# Patient Record
Sex: Female | Born: 1940
Health system: Southern US, Community
[De-identification: ages and names within clinical notes are randomized; demographics above are authoritative.]

## PROBLEM LIST (undated history)

## (undated) DIAGNOSIS — G20 Parkinson's disease: Secondary | ICD-10-CM

## (undated) DIAGNOSIS — I739 Peripheral vascular disease, unspecified: Secondary | ICD-10-CM

## (undated) DIAGNOSIS — G20A1 Parkinson's disease without dyskinesia, without mention of fluctuations: Secondary | ICD-10-CM

## (undated) DIAGNOSIS — I714 Abdominal aortic aneurysm, without rupture, unspecified: Secondary | ICD-10-CM

## (undated) DIAGNOSIS — E785 Hyperlipidemia, unspecified: Secondary | ICD-10-CM

## (undated) DIAGNOSIS — I5042 Chronic combined systolic (congestive) and diastolic (congestive) heart failure: Secondary | ICD-10-CM

## (undated) HISTORY — DX: Hyperlipidemia, unspecified: E78.5

## (undated) HISTORY — DX: Chronic combined systolic (congestive) and diastolic (congestive) heart failure: I50.42

## (undated) HISTORY — DX: Peripheral vascular disease, unspecified: I73.9

---

## 1963-07-20 HISTORY — PX: BREAST CYST ASPIRATION: SHX578

## 1978-07-19 HISTORY — PX: NASAL SEPTUM SURGERY: SHX37

## 2000-06-28 ENCOUNTER — Other Ambulatory Visit: Admission: RE | Admit: 2000-06-28 | Discharge: 2000-06-28 | Payer: Self-pay | Admitting: Obstetrics and Gynecology

## 2002-03-02 ENCOUNTER — Ambulatory Visit: Admission: RE | Admit: 2002-03-02 | Discharge: 2002-03-02 | Payer: Self-pay | Admitting: Oncology

## 2002-04-10 HISTORY — PX: HAMMER TOE SURGERY: SHX385

## 2003-05-23 ENCOUNTER — Other Ambulatory Visit: Admission: RE | Admit: 2003-05-23 | Discharge: 2003-05-23 | Payer: Self-pay | Admitting: Family Medicine

## 2004-06-03 ENCOUNTER — Other Ambulatory Visit: Admission: RE | Admit: 2004-06-03 | Discharge: 2004-06-03 | Payer: Self-pay | Admitting: Family Medicine

## 2004-06-03 ENCOUNTER — Ambulatory Visit: Payer: Self-pay | Admitting: Family Medicine

## 2004-06-19 ENCOUNTER — Ambulatory Visit: Payer: Self-pay | Admitting: Family Medicine

## 2004-09-19 ENCOUNTER — Ambulatory Visit: Payer: Self-pay | Admitting: Internal Medicine

## 2005-04-22 ENCOUNTER — Encounter: Admission: RE | Admit: 2005-04-22 | Discharge: 2005-04-22 | Payer: Self-pay | Admitting: Family Medicine

## 2005-04-22 ENCOUNTER — Ambulatory Visit: Payer: Self-pay | Admitting: Family Medicine

## 2005-06-15 ENCOUNTER — Other Ambulatory Visit: Admission: RE | Admit: 2005-06-15 | Discharge: 2005-06-15 | Payer: Self-pay | Admitting: Obstetrics and Gynecology

## 2005-09-07 ENCOUNTER — Encounter: Admission: RE | Admit: 2005-09-07 | Discharge: 2005-10-13 | Payer: Self-pay | Admitting: Orthopedic Surgery

## 2005-10-14 ENCOUNTER — Encounter: Admission: RE | Admit: 2005-10-14 | Discharge: 2006-01-12 | Payer: Self-pay | Admitting: Orthopedic Surgery

## 2006-04-12 ENCOUNTER — Ambulatory Visit: Payer: Self-pay | Admitting: Family Medicine

## 2006-06-01 ENCOUNTER — Ambulatory Visit: Payer: Self-pay | Admitting: Family Medicine

## 2006-06-01 ENCOUNTER — Ambulatory Visit: Payer: Self-pay | Admitting: Internal Medicine

## 2006-07-04 ENCOUNTER — Ambulatory Visit: Payer: Self-pay | Admitting: Family Medicine

## 2006-07-04 ENCOUNTER — Encounter: Payer: Self-pay | Admitting: Family Medicine

## 2006-07-04 ENCOUNTER — Other Ambulatory Visit: Admission: RE | Admit: 2006-07-04 | Discharge: 2006-07-04 | Payer: Self-pay | Admitting: Family Medicine

## 2006-07-04 LAB — CONVERTED CEMR LAB
ALT: 35 units/L (ref 0–40)
AST: 30 units/L (ref 0–37)
Albumin: 4.1 g/dL (ref 3.5–5.2)
Alkaline Phosphatase: 64 units/L (ref 39–117)
Basophils Absolute: 0 10*3/uL (ref 0.0–0.1)
Basophils Relative: 0.3 % (ref 0.0–1.0)
Bilirubin, Direct: 0.1 mg/dL (ref 0.0–0.3)
Chol/HDL Ratio, serum: 3.1
Cholesterol: 142 mg/dL (ref 0–200)
Eosinophil percent: 1.5 % (ref 0.0–5.0)
HCT: 42.2 % (ref 36.0–46.0)
HDL: 45.5 mg/dL (ref >39.0)
Hemoglobin: 13.6 g/dL (ref 12.0–15.0)
LDL Cholesterol: 81 mg/dL (ref 0–99)
Lymphocytes Relative: 23.1 % (ref 12.0–46.0)
MCHC: 32.2 g/dL (ref 30.0–36.0)
MCV: 96.1 fL (ref 78.0–100.0)
Monocytes Absolute: 0.4 10*3/uL (ref 0.2–0.7)
Monocytes Relative: 6.9 % (ref 3.0–11.0)
Neutro Abs: 4.4 10*3/uL (ref 1.4–7.7)
Neutrophils Relative %: 68.2 % (ref 43.0–77.0)
Platelets: 311 10*3/uL (ref 150–400)
RBC: 4.4 M/uL (ref 3.87–5.11)
RDW: 12.2 % (ref 11.5–14.6)
TSH: 1.81 microintl units/mL (ref 0.35–5.50)
Total Bilirubin: 0.9 mg/dL (ref 0.3–1.2)
Total Protein: 7.3 g/dL (ref 6.0–8.3)
Triglyceride fasting, serum: 78 mg/dL (ref 0–149)
VLDL: 16 mg/dL (ref 0–40)
WBC: 6.4 10*3/uL (ref 4.5–10.5)

## 2006-07-05 ENCOUNTER — Ambulatory Visit: Payer: Self-pay | Admitting: Family Medicine

## 2006-07-07 ENCOUNTER — Encounter: Payer: Self-pay | Admitting: Internal Medicine

## 2006-07-07 ENCOUNTER — Ambulatory Visit: Payer: Self-pay | Admitting: Internal Medicine

## 2006-07-13 ENCOUNTER — Ambulatory Visit: Payer: Self-pay | Admitting: Cardiology

## 2006-08-04 ENCOUNTER — Ambulatory Visit: Payer: Self-pay | Admitting: Family Medicine

## 2007-05-16 ENCOUNTER — Ambulatory Visit: Payer: Self-pay | Admitting: Family Medicine

## 2007-08-18 ENCOUNTER — Encounter: Payer: Self-pay | Admitting: Family Medicine

## 2007-08-18 ENCOUNTER — Ambulatory Visit: Payer: Self-pay | Admitting: Family Medicine

## 2007-08-18 ENCOUNTER — Other Ambulatory Visit: Admission: RE | Admit: 2007-08-18 | Discharge: 2007-08-18 | Payer: Self-pay | Admitting: Family Medicine

## 2007-08-18 DIAGNOSIS — N951 Menopausal and female climacteric states: Secondary | ICD-10-CM | POA: Insufficient documentation

## 2007-08-18 DIAGNOSIS — N644 Mastodynia: Secondary | ICD-10-CM | POA: Insufficient documentation

## 2007-08-18 DIAGNOSIS — K219 Gastro-esophageal reflux disease without esophagitis: Secondary | ICD-10-CM | POA: Insufficient documentation

## 2007-08-18 DIAGNOSIS — E785 Hyperlipidemia, unspecified: Secondary | ICD-10-CM | POA: Insufficient documentation

## 2007-08-18 DIAGNOSIS — Z9189 Other specified personal risk factors, not elsewhere classified: Secondary | ICD-10-CM | POA: Insufficient documentation

## 2007-08-18 DIAGNOSIS — N6009 Solitary cyst of unspecified breast: Secondary | ICD-10-CM | POA: Insufficient documentation

## 2007-08-18 DIAGNOSIS — Z9889 Other specified postprocedural states: Secondary | ICD-10-CM | POA: Insufficient documentation

## 2007-08-22 ENCOUNTER — Encounter (INDEPENDENT_AMBULATORY_CARE_PROVIDER_SITE_OTHER): Payer: Self-pay | Admitting: *Deleted

## 2007-10-05 ENCOUNTER — Encounter: Payer: Self-pay | Admitting: Family Medicine

## 2007-10-16 ENCOUNTER — Telehealth (INDEPENDENT_AMBULATORY_CARE_PROVIDER_SITE_OTHER): Payer: Self-pay | Admitting: *Deleted

## 2007-11-13 ENCOUNTER — Encounter (INDEPENDENT_AMBULATORY_CARE_PROVIDER_SITE_OTHER): Payer: Self-pay | Admitting: *Deleted

## 2007-11-13 ENCOUNTER — Ambulatory Visit: Payer: Self-pay | Admitting: Family Medicine

## 2007-11-13 LAB — CONVERTED CEMR LAB
OCCULT 1: NEGATIVE
OCCULT 2: NEGATIVE
OCCULT 3: NEGATIVE

## 2008-02-23 ENCOUNTER — Ambulatory Visit: Payer: Self-pay | Admitting: Family Medicine

## 2008-02-27 ENCOUNTER — Encounter: Payer: Self-pay | Admitting: Family Medicine

## 2008-03-04 ENCOUNTER — Encounter (INDEPENDENT_AMBULATORY_CARE_PROVIDER_SITE_OTHER): Payer: Self-pay | Admitting: *Deleted

## 2008-04-16 ENCOUNTER — Ambulatory Visit: Payer: Self-pay | Admitting: Family Medicine

## 2008-06-04 ENCOUNTER — Telehealth: Payer: Self-pay | Admitting: Family Medicine

## 2008-10-17 ENCOUNTER — Telehealth (INDEPENDENT_AMBULATORY_CARE_PROVIDER_SITE_OTHER): Payer: Self-pay | Admitting: *Deleted

## 2008-11-13 ENCOUNTER — Encounter: Payer: Self-pay | Admitting: Family Medicine

## 2008-11-15 ENCOUNTER — Other Ambulatory Visit: Admission: RE | Admit: 2008-11-15 | Discharge: 2008-11-15 | Payer: Self-pay | Admitting: Family Medicine

## 2008-11-15 ENCOUNTER — Encounter: Payer: Self-pay | Admitting: Family Medicine

## 2008-11-15 ENCOUNTER — Ambulatory Visit: Payer: Self-pay | Admitting: Family Medicine

## 2008-11-19 ENCOUNTER — Encounter (INDEPENDENT_AMBULATORY_CARE_PROVIDER_SITE_OTHER): Payer: Self-pay | Admitting: *Deleted

## 2008-12-20 ENCOUNTER — Encounter: Payer: Self-pay | Admitting: Family Medicine

## 2008-12-20 ENCOUNTER — Ambulatory Visit: Payer: Self-pay | Admitting: Family Medicine

## 2008-12-20 DIAGNOSIS — D239 Other benign neoplasm of skin, unspecified: Secondary | ICD-10-CM | POA: Insufficient documentation

## 2008-12-24 ENCOUNTER — Ambulatory Visit: Payer: Self-pay | Admitting: Family Medicine

## 2009-02-12 ENCOUNTER — Ambulatory Visit: Payer: Self-pay | Admitting: Family Medicine

## 2009-02-16 LAB — CONVERTED CEMR LAB
ALT: 22 units/L (ref 0–35)
AST: 26 units/L (ref 0–37)
Albumin: 4.3 g/dL (ref 3.5–5.2)
Alkaline Phosphatase: 55 units/L (ref 39–117)
Bilirubin, Direct: 0 mg/dL (ref 0.0–0.3)
Cholesterol: 166 mg/dL (ref 0–200)
HDL: 51.7 mg/dL (ref >39.00)
LDL Cholesterol: 100 mg/dL — ABNORMAL HIGH (ref 0–99)
Total Bilirubin: 0.9 mg/dL (ref 0.3–1.2)
Total CHOL/HDL Ratio: 3
Total Protein: 7.4 g/dL (ref 6.0–8.3)
Triglycerides: 71 mg/dL (ref 0.0–149.0)
VLDL: 14.2 mg/dL (ref 0.0–40.0)
Vit D, 25-Hydroxy: 51 ng/mL (ref 30–89)

## 2009-02-17 ENCOUNTER — Encounter (INDEPENDENT_AMBULATORY_CARE_PROVIDER_SITE_OTHER): Payer: Self-pay | Admitting: *Deleted

## 2009-05-19 ENCOUNTER — Ambulatory Visit: Payer: Self-pay | Admitting: Family Medicine

## 2009-11-14 ENCOUNTER — Encounter: Payer: Self-pay | Admitting: Family Medicine

## 2009-12-05 ENCOUNTER — Ambulatory Visit: Payer: Self-pay | Admitting: Family Medicine

## 2009-12-08 ENCOUNTER — Encounter: Payer: Self-pay | Admitting: Family Medicine

## 2009-12-12 ENCOUNTER — Ambulatory Visit: Payer: Self-pay | Admitting: Family Medicine

## 2009-12-12 LAB — CONVERTED CEMR LAB: Fecal Occult Bld: NEGATIVE

## 2009-12-24 ENCOUNTER — Encounter: Payer: Self-pay | Admitting: Family Medicine

## 2010-01-06 DIAGNOSIS — M810 Age-related osteoporosis without current pathological fracture: Secondary | ICD-10-CM

## 2010-01-06 DIAGNOSIS — M8580 Other specified disorders of bone density and structure, unspecified site: Secondary | ICD-10-CM | POA: Insufficient documentation

## 2010-03-05 ENCOUNTER — Telehealth (INDEPENDENT_AMBULATORY_CARE_PROVIDER_SITE_OTHER): Payer: Self-pay | Admitting: *Deleted

## 2010-04-29 ENCOUNTER — Ambulatory Visit: Payer: Self-pay | Admitting: Family Medicine

## 2010-06-10 ENCOUNTER — Telehealth (INDEPENDENT_AMBULATORY_CARE_PROVIDER_SITE_OTHER): Payer: Self-pay | Admitting: *Deleted

## 2010-08-16 LAB — CONVERTED CEMR LAB
ALT: 20 units/L (ref 0–35)
ALT: 21 units/L (ref 0–35)
ALT: 28 units/L (ref 0–35)
AST: 22 units/L (ref 0–37)
AST: 26 units/L (ref 0–37)
AST: 26 units/L (ref 0–37)
Albumin: 4.2 g/dL (ref 3.5–5.2)
Albumin: 4.3 g/dL (ref 3.5–5.2)
Albumin: 4.5 g/dL (ref 3.5–5.2)
Alkaline Phosphatase: 55 units/L (ref 39–117)
Alkaline Phosphatase: 62 units/L (ref 39–117)
Alkaline Phosphatase: 63 units/L (ref 39–117)
BUN: 15 mg/dL (ref 6–23)
BUN: 15 mg/dL (ref 6–23)
BUN: 18 mg/dL (ref 6–23)
Basophils Absolute: 0 10*3/uL (ref 0.0–0.1)
Basophils Absolute: 0 10*3/uL (ref 0.0–0.1)
Basophils Absolute: 0 10*3/uL (ref 0.0–0.1)
Basophils Relative: 0 % (ref 0–1)
Basophils Relative: 0.3 % (ref 0.0–3.0)
Basophils Relative: 0.6 % (ref 0.0–1.0)
Bilirubin Urine: NEGATIVE
Bilirubin Urine: NEGATIVE
Bilirubin, Direct: 0.1 mg/dL (ref 0.0–0.3)
Bilirubin, Direct: 0.1 mg/dL (ref 0.0–0.3)
Bilirubin, Direct: 0.2 mg/dL (ref 0.0–0.3)
Blood in Urine, dipstick: NEGATIVE
CO2: 24 meq/L (ref 19–32)
CO2: 31 meq/L (ref 19–32)
CO2: 33 meq/L — ABNORMAL HIGH (ref 19–32)
Calcium: 9 mg/dL (ref 8.4–10.5)
Calcium: 9 mg/dL (ref 8.4–10.5)
Calcium: 9.1 mg/dL (ref 8.4–10.5)
Chloride: 103 meq/L (ref 96–112)
Chloride: 104 meq/L (ref 96–112)
Chloride: 106 meq/L (ref 96–112)
Cholesterol: 155 mg/dL (ref 0–200)
Cholesterol: 171 mg/dL (ref 0–200)
Cholesterol: 189 mg/dL (ref 0–200)
Creatinine, Ser: 0.6 mg/dL (ref 0.4–1.2)
Creatinine, Ser: 0.6 mg/dL (ref 0.4–1.2)
Creatinine, Ser: 0.68 mg/dL (ref 0.40–1.20)
Eosinophils Absolute: 0.1 10*3/uL (ref 0.0–0.6)
Eosinophils Absolute: 0.1 10*3/uL (ref 0.0–0.7)
Eosinophils Absolute: 0.1 10*3/uL (ref 0.0–0.7)
Eosinophils Relative: 1.3 % (ref 0.0–5.0)
Eosinophils Relative: 1.7 % (ref 0.0–5.0)
Eosinophils Relative: 2 % (ref 0–5)
GFR calc Af Amer: 128 mL/min
GFR calc non Af Amer: 106 mL/min
GFR calc non Af Amer: 98.95 mL/min (ref >60)
Glucose, Bld: 78 mg/dL (ref 70–99)
Glucose, Bld: 79 mg/dL (ref 70–99)
Glucose, Bld: 99 mg/dL (ref 70–99)
Glucose, Urine, Semiquant: NEGATIVE
Glucose, Urine, Semiquant: NEGATIVE
HCT: 37.2 % (ref 36.0–46.0)
HCT: 39.1 % (ref 36.0–46.0)
HCT: 39.7 % (ref 36.0–46.0)
HDL: 54 mg/dL (ref >39)
HDL: 54 mg/dL (ref >39.00)
HDL: 54.1 mg/dL (ref >39.0)
Hemoglobin: 12.8 g/dL (ref 12.0–15.0)
Hemoglobin: 13 g/dL (ref 12.0–15.0)
Hemoglobin: 13.3 g/dL (ref 12.0–15.0)
Indirect Bilirubin: 0.4 mg/dL (ref 0.0–0.9)
Ketones, urine, test strip: NEGATIVE
Ketones, urine, test strip: NEGATIVE
LDL Cholesterol: 119 mg/dL — ABNORMAL HIGH (ref 0–99)
LDL Cholesterol: 91 mg/dL (ref 0–99)
LDL Cholesterol: 99 mg/dL (ref 0–99)
Lymphocytes Relative: 33 % (ref 12–46)
Lymphocytes Relative: 33.3 % (ref 12.0–46.0)
Lymphocytes Relative: 36.3 % (ref 12.0–46.0)
Lymphs Abs: 1.8 10*3/uL (ref 0.7–4.0)
Lymphs Abs: 2 10*3/uL (ref 0.7–4.0)
MCHC: 33.3 g/dL (ref 30.0–36.0)
MCHC: 33.5 g/dL (ref 30.0–36.0)
MCHC: 34.3 g/dL (ref 30.0–36.0)
MCV: 93.9 fL (ref 78.0–100.0)
MCV: 94.7 fL (ref 78.0–100.0)
MCV: 95.4 fL (ref 78.0–100.0)
Monocytes Absolute: 0.3 10*3/uL (ref 0.1–1.0)
Monocytes Absolute: 0.3 10*3/uL (ref 0.1–1.0)
Monocytes Absolute: 0.4 10*3/uL (ref 0.2–0.7)
Monocytes Relative: 6 % (ref 3.0–12.0)
Monocytes Relative: 6 % (ref 3–12)
Monocytes Relative: 7.4 % (ref 3.0–11.0)
Neutro Abs: 2.8 10*3/uL (ref 1.4–7.7)
Neutro Abs: 3.1 10*3/uL (ref 1.4–7.7)
Neutro Abs: 3.2 10*3/uL (ref 1.7–7.7)
Neutrophils Relative %: 55.7 % (ref 43.0–77.0)
Neutrophils Relative %: 57.4 % (ref 43.0–77.0)
Neutrophils Relative %: 59 % (ref 43–77)
Nitrite: NEGATIVE
Nitrite: NEGATIVE
Platelets: 253 10*3/uL (ref 150–400)
Platelets: 276 10*3/uL (ref 150.0–400.0)
Platelets: 298 10*3/uL (ref 150–400)
Potassium: 4 meq/L (ref 3.5–5.1)
Potassium: 4.1 meq/L (ref 3.5–5.1)
Potassium: 4.3 meq/L (ref 3.5–5.3)
Protein, U semiquant: NEGATIVE
RBC: 3.93 M/uL (ref 3.87–5.11)
RBC: 4.16 M/uL (ref 3.87–5.11)
RBC: 4.17 M/uL (ref 3.87–5.11)
RDW: 12.2 % (ref 11.5–14.6)
RDW: 13.3 % (ref 11.5–15.5)
RDW: 13.4 % (ref 11.5–14.6)
Sodium: 140 meq/L (ref 135–145)
Sodium: 144 meq/L (ref 135–145)
Sodium: 144 meq/L (ref 135–145)
Specific Gravity, Urine: 1.01
Specific Gravity, Urine: 1.015
TSH: 1.61 microintl units/mL (ref 0.35–5.50)
TSH: 1.89 microintl units/mL (ref 0.35–5.50)
TSH: 1.906 microintl units/mL (ref 0.350–4.500)
Total Bilirubin: 0.6 mg/dL (ref 0.3–1.2)
Total Bilirubin: 0.6 mg/dL (ref 0.3–1.2)
Total Bilirubin: 0.8 mg/dL (ref 0.3–1.2)
Total CHOL/HDL Ratio: 2.9
Total CHOL/HDL Ratio: 3
Total CHOL/HDL Ratio: 3.5
Total Protein: 7 g/dL (ref 6.0–8.3)
Total Protein: 7 g/dL (ref 6.0–8.3)
Total Protein: 7 g/dL (ref 6.0–8.3)
Triglycerides: 51 mg/dL (ref 0–149)
Triglycerides: 78 mg/dL (ref <150)
Triglycerides: 90 mg/dL (ref 0.0–149.0)
Urobilinogen, UA: NEGATIVE
Urobilinogen, UA: NEGATIVE
VLDL: 10 mg/dL (ref 0–40)
VLDL: 16 mg/dL (ref 0–40)
VLDL: 18 mg/dL (ref 0.0–40.0)
Vit D, 25-Hydroxy: 24 ng/mL — ABNORMAL LOW (ref 30–89)
Vit D, 25-Hydroxy: 36 ng/mL (ref 30–89)
WBC Urine, dipstick: NEGATIVE
WBC Urine, dipstick: NEGATIVE
WBC: 5 10*3/uL (ref 4.5–10.5)
WBC: 5.5 10*3/uL (ref 4.0–10.5)
WBC: 5.6 10*3/uL (ref 4.5–10.5)
pH: 6.5
pH: 7

## 2010-08-18 NOTE — Progress Notes (Signed)
Summary: REFILL REQUEST  Phone Note Refill Request Call back at [phone] Message from:  Pharmacy on March 05, 2010 10:50 AM  Refills Requested: Medication #1:  PRAVACHOL 40 MG TABS Take one tablet each evening at bedtime.   Dosage confirmed as above?Dosage Confirmed   Supply Requested: 3 months   Last Refilled: 12/05/2009 [facility] [address]  Next Appointment Scheduled: NONE Initial call taken by: Lavell Islam,  March 05, 2010 10:51 AM  Follow-up for Phone Call        per pharmacy rx on file from 12-05-09, disregard request................Marland KitchenFelecia Deloach CMA  March 05, 2010 1:07 PM

## 2010-08-18 NOTE — Letter (Signed)
Summary: Cancer Screening/[facility] Personalized Risk Profile  Cancer Screening/[facility] Personalized Risk Profile   Imported By: Lanelle Bal 12/16/2009 12:39:24  _____________________________________________________________________  External Attachment:    Type:   Image     Comment:   External Document

## 2010-08-18 NOTE — Assessment & Plan Note (Signed)
Summary: flu//kn  Flu Vaccine Consent Questions     Do you have a history of severe allergic reactions to this vaccine? no    Any prior history of allergic reactions to egg and/or gelatin? no    Do you have a sensitivity to the preservative Thimersol? no    Do you have a past history of Guillan-Barre Syndrome? no    Do you currently have an acute febrile illness? no    Have you ever had a severe reaction to latex? no    Vaccine information given and explained to patient? yes    Are you currently pregnant? no    Lot Number:[id number]   Exp Date:01/16/2011   Site Given  Left Deltoid IM  Nurse Visit   Allergies: 1)  Penicillin G Potassium (Penicillin G Potassium) 2)  Penicillin G Potassium (Penicillin G Potassium)  Orders Added: 1)  Flu Vaccine 79yrs + MEDICARE PATIENTS [Q2039] 2)  Administration Flu vaccine - MCR [G0008]

## 2010-08-18 NOTE — Assessment & Plan Note (Signed)
Summary: CPX/PAP/KDC   Vital Signs:  Patient profile:   70 year old female Height:      61.25 inches Weight:      156.38 pounds BMI:     29.41 Pulse rate:   87 / minute Pulse rhythm:   regular BP sitting:   129 / 87  (left arm) Cuff size:   regular  Vitals Entered By: Army Fossa CMA (Dec 05, 2009 1:04 PM) CC: Pt here for CPX, does not want pap.  Refill on Pravachol.    History of Present Illness: Pt here for cpe, no pap and labs.   No complaints.   Preventive Screening-Counseling & Management  Alcohol-Tobacco     Alcohol drinks/day: <1     Alcohol type: wine     Smoking Status: never     Passive Smoke Exposure: no  Caffeine-Diet-Exercise     Caffeine use/day: 1     Does Patient Exercise: no     Exercise Counseling: to improve exercise regimen  Hep-HIV-STD-Contraception     HIV Risk: no     Dental Visit-last 6 months yes     Dental Care Counseling: not indicated; dental care within six months     SBE monthly: no     SBE Education/Counseling: to perform regular SBE     Sun Exposure-Excessive: remote  Safety-Violence-Falls     Seat Belt Use: yes     Firearms in the Home: no firearms in the home     Firearm Counseling: not applicable     Smoke Detectors: yes     Violence in the Home: no risk noted     Sexual Abuse: no     Fall Risk: no      Sexual History:  currently monogamous and married.    Current Medications (verified): 1)  Pravachol 40 Mg Tabs (Pravastatin Sodium) .... Take One Tablet Each Evening At Bedtime. 2)  Vitamin D [address] Unit Caps (Ergocalciferol) .... Take One Capsule Weekly. 3)  Bactrim Ds 800-160 Mg Tabs (Sulfamethoxazole-Trimethoprim) .Marland Kitchen.. 1 By Mouth Two Times A Day 4)  Zostavax 60454 Unt/0.41ml Solr (Zoster Vaccine Live) .Marland Kitchen.. 1 Ml Im X1  Allergies: 1)  Penicillin G Potassium (Penicillin G Potassium) 2)  Penicillin G Potassium (Penicillin G Potassium)  Past History:  Past Medical History: Last updated:  08/18/2007 Hyperlipidemia  Past Surgical History: Last updated: 08/18/2007 Left Hammer Toe surgery-04/10/02 Right Breast Benign Cyst-1965 Deviated Septum-1980  Family History: Last updated: 08/18/2007 Family History Breast cancer 1st degree relative <50 Family History of Colon CA 1st degree relative <60 Family History High cholesterol  Social History: Last updated: 08/18/2007 Never Smoked Alcohol use-yes Drug use-no Regular exercise-yes  Risk Factors: Alcohol Use: <1 (12/05/2009) Caffeine Use: 1 (12/05/2009) Exercise: no (12/05/2009)  Risk Factors: Smoking Status: never (12/05/2009) Passive Smoke Exposure: no (12/05/2009)  Family History: Reviewed history from 08/18/2007 and no changes required. Family History Breast cancer 1st degree relative <50 Family History of Colon CA 1st degree relative <60 Family History High cholesterol  Social History: Reviewed history from 08/18/2007 and no changes required. Never Smoked Alcohol use-yes Drug use-no Regular exercise-yes Does Patient Exercise:  no Sexual History:  currently monogamous, married  Fall Risk:  no  Review of Systems      See HPI General:  Denies chills, fatigue, fever, loss of appetite, malaise, sleep disorder, sweats, weakness, and weight loss. Eyes:  Denies blurring, discharge, double vision, eye irritation, eye pain, halos, itching, light sensitivity, red eye, vision loss-1 eye, and vision loss-both  eyes; optho-- q1y. ENT:  Denies decreased hearing, difficulty swallowing, ear discharge, earache, hoarseness, nasal congestion, nosebleeds, postnasal drainage, ringing in ears, sinus pressure, and sore throat. CV:  Denies bluish discoloration of lips or nails, chest pain or discomfort, difficulty breathing at night, difficulty breathing while lying down, fainting, fatigue, leg cramps with exertion, lightheadness, near fainting, palpitations, and shortness of breath with exertion. Resp:  Denies chest discomfort,  chest pain with inspiration, cough, coughing up blood, excessive snoring, hypersomnolence, morning headaches, pleuritic, shortness of breath, sputum productive, and wheezing. GI:  Denies abdominal pain, bloody stools, change in bowel habits, constipation, dark tarry stools, diarrhea, excessive appetite, gas, hemorrhoids, indigestion, loss of appetite, and nausea. GU:  Denies abnormal vaginal bleeding, decreased libido, discharge, dysuria, genital sores, hematuria, incontinence, nocturia, urinary frequency, and urinary hesitancy. MS:  Denies joint pain, joint redness, joint swelling, loss of strength, low back pain, mid back pain, muscle aches, muscle , cramps, muscle weakness, stiffness, and thoracic pain; ortho--  Dr Rennis Chris. Derm:  Denies changes in color of skin, changes in nail beds, dryness, excessive perspiration, flushing, hair loss, insect bite(s), itching, lesion(s), poor wound healing, and rash. Neuro:  Denies brief paralysis, difficulty with concentration, disturbances in coordination, falling down, headaches, inability to speak, memory loss, numbness, poor balance, seizures, sensation of room spinning, tingling, tremors, visual disturbances, and weakness. Psych:  Denies alternate hallucination ( auditory/visual), anxiety, depression, easily angered, easily tearful, irritability, mental problems, panic attacks, sense of great danger, suicidal thoughts/plans, thoughts of violence, unusual visions or sounds, and thoughts /plans of harming others. Endo:  Denies cold intolerance, excessive hunger, excessive thirst, excessive urination, heat intolerance, polyuria, and weight change. Heme:  Denies abnormal bruising, bleeding, enlarge lymph nodes, fevers, pallor, and skin discoloration. Allergy:  Denies hives or rash, itching eyes, persistent infections, seasonal allergies, and sneezing.  Physical Exam  General:  Well-developed,well-nourished,in no acute distress; alert,appropriate and cooperative  throughout examination Head:  Normocephalic and atraumatic without obvious abnormalities. No apparent alopecia or balding. Eyes:  vision grossly intact, pupils equal, pupils round, pupils reactive to light, and no injection.   Ears:  External ear exam shows no significant lesions or deformities.  Otoscopic examination reveals clear canals, tympanic membranes are intact bilaterally without bulging, retraction, inflammation or discharge. Hearing is grossly normal bilaterally. Nose:  External nasal examination shows no deformity or inflammation. Nasal mucosa are pink and moist without lesions or exudates. Mouth:  Oral mucosa and oropharynx without lesions or exudates.  Teeth in good repair. Neck:  No deformities, masses, or tenderness noted. Chest Wall:  No deformities, masses, or tenderness noted. Breasts:  No mass, nodules, thickening, tenderness, bulging, retraction, inflamation, nipple discharge or skin changes noted.   Lungs:  Normal respiratory effort, chest expands symmetrically. Lungs are clear to auscultation, no crackles or wheezes. Heart:  normal rate and no murmur.   Abdomen:  Bowel sounds positive,abdomen soft and non-tender without masses, organomegaly or hernias noted. Msk:  normal ROM, no joint tenderness, no joint swelling, no joint warmth, no redness over joints, no joint deformities, no joint instability, and no crepitation.   Pulses:  R posterior tibial normal, R dorsalis pedis normal, R carotid normal, L posterior tibial normal, L dorsalis pedis normal, and L carotid normal.   Extremities:  No clubbing, cyanosis, edema, or deformity noted with normal full range of motion of all joints.   Neurologic:  No cranial nerve deficits noted. Station and gait are normal. Plantar reflexes are down-going bilaterally. DTRs are symmetrical throughout. Sensory,  motor and coordinative functions appear intact. Skin:  Intact without suspicious lesions or rashes Cervical Nodes:  No lymphadenopathy  noted Axillary Nodes:  No palpable lymphadenopathy Psych:  Cognition and judgment appear intact. Alert and cooperative with normal attention span and concentration. No apparent delusions, illusions, hallucinationsnot anxious appearing and not depressed appearing.     Impression & Recommendations:  Problem # 1:  PREVENTIVE HEALTH CARE (ICD-V70.0) ghm utd Orders: Venipuncture (95621) TLB-Lipid Panel (80061-LIPID) TLB-BMP (Basic Metabolic Panel-BMET) (80048-METABOL) TLB-CBC Platelet - w/Differential (85025-CBCD) TLB-Hepatic/Liver Function Pnl (80076-HEPATIC) TLB-TSH (Thyroid Stimulating Hormone) (84443-TSH) T-Vitamin D (25-Hydroxy) (30865-78469) UA Dipstick w/o Micro (manual) (62952) First annual wellness visit with prevention plan  (W4132) EKG w/ Interpretation (93000)  Problem # 2:  HYPERLIPIDEMIA (ICD-272.4)  Her updated medication list for this problem includes:    Pravachol 40 Mg Tabs (Pravastatin sodium) .Marland Kitchen... Take one tablet each evening at bedtime.  Orders: Venipuncture (44010) TLB-Lipid Panel (80061-LIPID) TLB-BMP (Basic Metabolic Panel-BMET) (80048-METABOL) TLB-CBC Platelet - w/Differential (85025-CBCD) TLB-Hepatic/Liver Function Pnl (80076-HEPATIC) TLB-TSH (Thyroid Stimulating Hormone) (84443-TSH) T-Vitamin D (25-Hydroxy) (27253-66440) First annual wellness visit with prevention plan  (H4742) EKG w/ Interpretation (93000)  Labs Reviewed: SGOT: 26 (02/12/2009)   SGPT: 22 (02/12/2009)   HDL:51.70 (02/12/2009), 54 (11/15/2008)  LDL:100 (02/12/2009), 119 (11/15/2008)  Chol:166 (02/12/2009), 189 (11/15/2008)  Trig:71.0 (02/12/2009), 78 (11/15/2008)  Problem # 3:  POSTMENOPAUSAL STATUS (ICD-627.2)  Discussed treatment options.   Orders: Venipuncture (59563) TLB-Lipid Panel (80061-LIPID) TLB-BMP (Basic Metabolic Panel-BMET) (80048-METABOL) TLB-CBC Platelet - w/Differential (85025-CBCD) TLB-Hepatic/Liver Function Pnl (80076-HEPATIC) TLB-TSH (Thyroid Stimulating  Hormone) (84443-TSH) T-Vitamin D (25-Hydroxy) (87564-33295) Radiology Referral (Radiology) First annual wellness visit with prevention plan  (J8841) EKG w/ Interpretation (93000)  Complete Medication List: 1)  Pravachol 40 Mg Tabs (Pravastatin sodium) .... Take one tablet each evening at bedtime. 2)  Vitamin D 66063 Unit Caps (Ergocalciferol) .... Take one capsule weekly. 3)  Bactrim Ds 800-160 Mg Tabs (Sulfamethoxazole-trimethoprim) .Marland Kitchen.. 1 by mouth two times a day 4)  Zostavax 01601 Unt/0.35ml Solr (Zoster vaccine live) .Marland Kitchen.. 1 ml im x1 Prescriptions: ZOSTAVAX 09323 UNT/0.65ML SOLR (ZOSTER VACCINE LIVE) 1 ml IM x1  #1 x 0   Entered and Authorized by:   Loreen Freud DO   Signed by:   Loreen Freud DO on 12/05/2009   Method used:   Print then Give to Patient   RxID:   [id number] BACTRIM DS 800-160 MG TABS (SULFAMETHOXAZOLE-TRIMETHOPRIM) 1 by mouth two times a day  #14 x 0   Entered and Authorized by:   Loreen Freud DO   Signed by:   Loreen Freud DO on 12/05/2009   Method used:   Electronically to        [facility] (retail)       [address]       [place], [place]  [address]       Ph: [phone]       Fax: [phone]   RxID:   [id number] PRAVACHOL 40 MG TABS (PRAVASTATIN SODIUM) Take one tablet each evening at bedtime.  #30 x 5   Entered and Authorized by:   Loreen Freud DO   Signed by:   Loreen Freud DO on 12/05/2009   Method used:   Electronically to        [facility] (retail)       [address]       [place], [place]  [address]       Ph: [phone]       Fax: [phone]   RxID:   [id number]    EKG  Procedure date:  12/05/2009  Findings:      Normal sinus rhythm with rate of:  90 bpm    Last Flu Vaccine:  Fluvax 3+ (05/19/2009 8:56:49 AM) Flu Vaccine Result Date:  05/19/2009 Flu Vaccine Result:  given Flu Vaccine Next Due:  1 yr Last Mammogram:  normal (11/13/2008 2:05:51  PM) Mammogram Result Date:  11/14/2009 Mammogram Result:  normal Mammogram Next Due:  1 yr      Laboratory Results   Urine Tests   Date/Time Reported: Dec 05, 2009 1:47 PM   Routine Urinalysis   Color: yellow Appearance: Clear Glucose: negative   (Normal Range: Negative) Bilirubin: negative   (Normal Range: Negative) Ketone: negative   (Normal Range: Negative) Spec. Gravity: 1.015   (Normal Range: 1.003-1.035) Blood: negative   (Normal Range: Negative) pH: 6.5   (Normal Range: 5.0-8.0) Protein: trace   (Normal Range: Negative) Urobilinogen: negative   (Normal Range: 0-1) Nitrite: negative   (Normal Range: Negative) Leukocyte Esterace: negative   (Normal Range: Negative)    Comments: Floydene Flock  Dec 05, 2009 1:48 PM

## 2010-08-18 NOTE — Progress Notes (Signed)
Summary: pravachol refill   Phone Note Refill Request Call back at [phone] Message from:  Pharmacy on June 10, 2010 8:18 AM  Refills Requested: Medication #1:  PRAVACHOL 40 MG TABS Take one tablet each evening at bedtime.   Dosage confirmed as above?Dosage Confirmed   Supply Requested: 1 month   Last Refilled: 03/05/2010 [facility] [address]   Next Appointment Scheduled: none Initial call taken by: Harold Barban,  June 10, 2010 8:18 AM    Prescriptions: PRAVACHOL 40 MG TABS (PRAVASTATIN SODIUM) Take one tablet each evening at bedtime.  #30 x 1   Entered by:   Doristine Devoid CMA   Authorized by:   Loreen Freud DO   Signed by:   Doristine Devoid CMA on 06/10/2010   Method used:   Electronically to        [facility] (retail)       [address]       [place], [place]  [address]       Ph: [phone]       Fax: [phone]   RxID:   [id number]   Appended Document: pravachol refill      Clinical Lists Changes  Medications: Rx of PRAVACHOL 40 MG TABS (PRAVASTATIN SODIUM) Take one tablet each evening at bedtime.;  #90 x 0;  Signed;  Entered by: Doristine Devoid CMA;  Authorized by: Loreen Freud DO;  Method used: Electronically to [facility] [address], [place], [place], [place]  [address], Ph: [phone], Fax: [phone]    Prescriptions: PRAVACHOL 40 MG TABS (PRAVASTATIN SODIUM) Take one tablet each evening at bedtime.  #90 x 0   Entered by:   Doristine Devoid CMA   Authorized by:   Loreen Freud DO   Signed by:   Doristine Devoid CMA on 06/10/2010   Method used:   Electronically to        [facility] (retail)       [address]       [place], [place]  [address]       Ph: [phone]       Fax: [phone]   RxID:   [id number]

## 2010-08-18 NOTE — Consult Note (Signed)
Summary: [facility]   Imported By: Lanelle Bal 12/24/2009 10:08:18  _____________________________________________________________________  External Attachment:    Type:   Image     Comment:   External Document

## 2010-11-01 ENCOUNTER — Ambulatory Visit (HOSPITAL_COMMUNITY)
Admission: RE | Admit: 2010-11-01 | Discharge: 2010-11-01 | Disposition: A | Discharge status: Home / Self Care | Payer: Medicare Other | Source: Ambulatory Visit | Attending: Emergency Medicine | Admitting: Emergency Medicine

## 2010-11-01 ENCOUNTER — Other Ambulatory Visit: Payer: Self-pay | Admitting: Emergency Medicine

## 2010-11-01 DIAGNOSIS — R319 Hematuria, unspecified: Secondary | ICD-10-CM | POA: Insufficient documentation

## 2010-11-01 DIAGNOSIS — R109 Unspecified abdominal pain: Secondary | ICD-10-CM

## 2010-11-04 ENCOUNTER — Other Ambulatory Visit: Payer: Self-pay | Admitting: Emergency Medicine

## 2010-11-04 DIAGNOSIS — R9389 Abnormal findings on diagnostic imaging of other specified body structures: Secondary | ICD-10-CM

## 2010-11-09 ENCOUNTER — Other Ambulatory Visit: Payer: Medicare Other

## 2010-11-10 ENCOUNTER — Ambulatory Visit
Admission: RE | Admit: 2010-11-10 | Discharge: 2010-11-10 | Disposition: A | Discharge status: Home / Self Care | Payer: Medicare Other | Source: Ambulatory Visit | Attending: Emergency Medicine | Admitting: Emergency Medicine

## 2010-11-10 DIAGNOSIS — R9389 Abnormal findings on diagnostic imaging of other specified body structures: Secondary | ICD-10-CM

## 2010-11-12 ENCOUNTER — Other Ambulatory Visit: Payer: Self-pay | Admitting: Emergency Medicine

## 2010-11-12 DIAGNOSIS — I779 Disorder of arteries and arterioles, unspecified: Secondary | ICD-10-CM

## 2010-11-16 ENCOUNTER — Ambulatory Visit
Admission: RE | Admit: 2010-11-16 | Discharge: 2010-11-16 | Disposition: A | Discharge status: Home / Self Care | Payer: Medicare Other | Source: Ambulatory Visit | Attending: Emergency Medicine | Admitting: Emergency Medicine

## 2010-11-16 DIAGNOSIS — I779 Disorder of arteries and arterioles, unspecified: Secondary | ICD-10-CM

## 2010-11-20 ENCOUNTER — Encounter: Payer: Self-pay | Admitting: Family Medicine

## 2010-11-25 ENCOUNTER — Encounter (INDEPENDENT_AMBULATORY_CARE_PROVIDER_SITE_OTHER): Payer: Medicare Other | Admitting: Cardiothoracic Surgery

## 2010-11-25 DIAGNOSIS — I712 Thoracic aortic aneurysm, without rupture: Secondary | ICD-10-CM

## 2010-11-26 NOTE — Consult Note (Signed)
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  CHIEF COMPLAINT:  Left flank pain.  HISTORY OF PRESENT ILLNESS:  Elizabeth Mccullough is a 70 year old out Caucasian female nonsmoker who presents for evaluation of an abnormal CT scan of the chest.  She was seen by Dr. Cleta Alberts at urgent care for a neuritic type of left flank pain possibly related to a soft tissue injury from lifting her disabled husband and a chest x-ray showed enlarged mediastinal silhouette suspicious thoracic aortic disease.  A subsequent CT scan of the chest as well as an MRI of the chest were performed without contrast (she is allergic to IV CONTRAST).  These both demonstrated her ascending aorta measured 4.0 cm in diameter.  There is no evidence of a penetrating ulcer dissection or false lumen or hematoma.  Because of the abnormality of the ascending thoracic aorta, a CT surgical evaluation was requested.  The patient denies any concerning chest pains substernal or intrascapular.  The left flank pain has significantly improved with time.  She denies dyspnea on exertion, orthopnea or PND.  She denies angina, arrhythmia or presyncope.  She denies history of hypertension.  She denies family history of abdominal or thoracic aneurysm disease or dissection.  PAST MEDICAL HISTORY: 1. IV contrast allergy. 2. History of porphyria. 3. Dyslipidemia.  HOME MEDICATIONS:  Pravastatin, aspirin 1 daily and multivitamin.  SOCIAL HISTORY:  She is nonsmoker, nondrinker.  She is retired.  She takes full care of her disabled husband who has had a severe stroke.  He lives at home with her.  REVIEW OF SYSTEMS:  CONSTITUTIONAL:  Negative for fever, weight loss. ENT:  Negative for change in vision, difficulty swallowing  or dental disease.   THORACIC:  Negative for history of significant thoracic trauma, previously abnormal chest x-ray or hemoptysis.  CARDIAC: Negative for arrhythmia or known cardiac murmur, angina or symptoms of CHF.  GI:  Negative for jaundice, blood per rectum.  ENDOCRINE: Negative for diabetes.  VASCULAR:  Positive for bilateral varicose veins, left greater than right for she has had sclerotherapy without improvement.  NEUROLOGIC:  Negative for stroke or seizure.  PHYSICAL EXAMINATION:  VITAL SIGNS:  The patient is 5 feet 1 inches and weighs 155 pounds.  Blood pressure is 150/88, pulse 70, respirations 20, saturation 98% on room air. GENERAL:  She is alert and very pleasant and very intelligent. HEENT:  Normocephalic.  Pupils are equal. NECK:  Without JVD, mass or carotid bruit. LYMPHATICS:  No palpable supraclavicular or cervical adenopathy.  Breath sounds are clear.  She has no deformity or rash along the left flank area where her pain has been and this is only minimally tender. CARDIAC:  Rhythm is regular without S3 gallop.  She has a 2/6 diastolic murmur consistent with aortic insufficiency.  She is in sinus rhythm. ABDOMEN:  Soft, nontender without pulsatile mass. EXTREMITIES:  No clubbing, cyanosis or edema.  She has significant varicose veins, left greater than right leg. NEUROLOGIC:  Alert and oriented without focal motor deficit.  LABORATORY DATA:  Reviewed her CT scan of the chest and MRI of the chest and she has a fusiform dilatation  of the ascending aorta which measures 4.0 cm at its largest diameter.  There is no clear evidence of penetrating ulcer, surrounding hematoma or false lumen although the study was done without contrast.  However, I reviewed these carefully and feel this is a low-risk exam.  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  Many of these are very significant but need to be followed.  I would recommend a baseline 2-D echo and a 1-year CT scan of the chest with IV contrast which will be prepped with a steroid prep to further study the thoracic aorta in better detail.  She knows to call us if she develops any severe unusual substernal or interscapular pain but at size 4 cm, the risk of aortic dissection is less than 1% per year.  She is also advised to keep a close watch on her blood pressure and if consistently has blood pressures greater than 150 systolic, then a beta-blocker would be the best agent to control her blood pressure and control her thoracic aortic disease.  I will plan on seeing her back in a year with a CT of the chest with IV contrast and I provided with a prescription for prednisone for the prep.  Kerin Perna, M.D. Electronically Signed  PV/MEDQ  D:  11/25/2010  T:  11/26/2010  Job:  [id number]  cc:   Lelon Perla, DO [place] Cleta Alberts, M.D.

## 2010-11-30 ENCOUNTER — Encounter: Payer: Self-pay | Admitting: Family Medicine

## 2010-12-01 ENCOUNTER — Encounter: Payer: Self-pay | Admitting: Family Medicine

## 2010-12-03 ENCOUNTER — Encounter: Payer: Self-pay | Admitting: Family Medicine

## 2010-12-04 NOTE — Assessment & Plan Note (Signed)
[facility]                           GASTROENTEROLOGY OFFICE NOTE   NAME:Perezgarcia, ANTONIO WOODHAMS                 MRN:          [id number]  DATE:06/01/2006                            DOB:          Jul 29, 1940    Ms. Gohlke is a 70 year old white female who is here to schedule  colonoscopy and upper endoscopy.  We saw her in 70 and again in January  2003 because of family history of colon cancer in her sister.  Her  colonoscopy on July 21, 2001, showed mild diverticulosis of the left  colon.  There were no polyps.  She now since then has developed  gastroesophageal reflux and has been on omeprazole 20 mg daily.  She also  takes 81 mg aspirin, milk thistle, fish oil, calcium, chromium picolinate,  glucosamine, multiple vitamin, Zocor.  She denies any dysphagia or  odynophagia, but her husband, who is our patient, was recently diagnosed  with Barrett's esophagus.  She is concerned that she may have a Barrett's  esophagus because of longstanding gastroesophageal reflux and for that  reason, she would like to have an upper endoscopy together with a  colonoscopy, which is due in the next couple of months.   IMPRESSION:  79. A 70 year old with gastroesophageal reflux, rule out Barrett's      esophagus.  2. Positive family history of colon cancer.   PLAN:  1. Upper endoscopy and colonoscopy scheduled.  2. Continue omeprazole 20 mg daily.     Hedwig Morton. Juanda Chance, MD  Electronically Signed    DMB/MedQ  DD: 06/01/2006  DT: 06/01/2006  Job #: [id number]   cc:   Loreen Freud, M.D.

## 2010-12-04 NOTE — Letter (Signed)
September 22, 2006     RE:  Elizabeth Mccullough, Elizabeth Mccullough  MRN:  [id number]  /  DOB:  Mar 27, 1941   To Whom It May Concern:   Elizabeth Mccullough is a 70 year old white female who has a history of  frequent UTIs and prolapsed bladder. She has a problem incontinence and  is unable to sit for any length of time without going to the bathroom.  Please excuse the patient from jury duty at this time. Please feel free  to call with any further questions.    Sincerely,      Lelon Perla, DO  Electronically Signed    Shawnie Dapper  DD: 09/22/2006  DT: 09/22/2006  Job #: [id number]

## 2010-12-07 ENCOUNTER — Other Ambulatory Visit (HOSPITAL_COMMUNITY)
Admission: RE | Admit: 2010-12-07 | Discharge: 2010-12-07 | Disposition: A | Discharge status: Home / Self Care | Payer: Medicare Other | Source: Ambulatory Visit | Attending: Family Medicine | Admitting: Family Medicine

## 2010-12-07 ENCOUNTER — Encounter: Payer: Self-pay | Admitting: Family Medicine

## 2010-12-07 ENCOUNTER — Ambulatory Visit (INDEPENDENT_AMBULATORY_CARE_PROVIDER_SITE_OTHER): Payer: Medicare Other | Admitting: Family Medicine

## 2010-12-07 DIAGNOSIS — E785 Hyperlipidemia, unspecified: Secondary | ICD-10-CM

## 2010-12-07 DIAGNOSIS — Z0000 Encounter for general adult medical examination without abnormal findings: Secondary | ICD-10-CM

## 2010-12-07 DIAGNOSIS — Z01419 Encounter for gynecological examination (general) (routine) without abnormal findings: Secondary | ICD-10-CM | POA: Insufficient documentation

## 2010-12-07 LAB — CBC WITH DIFFERENTIAL/PLATELET
Basophils Absolute: 0 10*3/uL (ref 0.0–0.1)
Eosinophils Relative: 2.4 % (ref 0.0–5.0)
HCT: 37.8 % (ref 36.0–46.0)
Hemoglobin: 12.7 g/dL (ref 12.0–15.0)
Lymphocytes Relative: 33.2 % (ref 12.0–46.0)
Lymphs Abs: 2.2 10*3/uL (ref 0.7–4.0)
Monocytes Relative: 8.6 % (ref 3.0–12.0)
Platelets: 263 10*3/uL (ref 150.0–400.0)
RDW: 13.3 % (ref 11.5–14.6)
WBC: 6.7 10*3/uL (ref 4.5–10.5)

## 2010-12-07 LAB — HEPATIC FUNCTION PANEL
ALT: 19 U/L (ref 0–35)
AST: 23 U/L (ref 0–37)
Alkaline Phosphatase: 56 U/L (ref 39–117)
Total Bilirubin: 0.9 mg/dL (ref 0.3–1.2)

## 2010-12-07 LAB — POCT URINALYSIS DIPSTICK
Glucose, UA: NEGATIVE
Leukocytes, UA: NEGATIVE
Nitrite, UA: NEGATIVE
Protein, UA: NEGATIVE
Urobilinogen, UA: NEGATIVE

## 2010-12-07 LAB — LIPID PANEL
Cholesterol: 164 mg/dL (ref 0–200)
LDL Cholesterol: 90 mg/dL (ref 0–99)
Total CHOL/HDL Ratio: 3
VLDL: 11.8 mg/dL (ref 0.0–40.0)

## 2010-12-07 LAB — BASIC METABOLIC PANEL
BUN: 21 mg/dL (ref 6–23)
Calcium: 9.4 mg/dL (ref 8.4–10.5)
GFR: 100.5 mL/min (ref >60.00)
Potassium: 3.7 mEq/L (ref 3.5–5.1)
Sodium: 141 mEq/L (ref 135–145)

## 2010-12-07 MED ORDER — PRAVASTATIN SODIUM 40 MG PO TABS: 40.0000 mg | ORAL_TABLET | Freq: Every day | ORAL | Status: DC

## 2010-12-07 NOTE — Progress Notes (Signed)
Addended by: Loreen Freud on: 12/07/2010 02:05 PM   Modules accepted: Orders

## 2010-12-07 NOTE — Progress Notes (Signed)
Subjective:     Elizabeth Mccullough is a 70 y.o. female and is here for a comprehensive physical exam. The patient reports no problems.  History   Social History  . Marital Status: Married    Spouse Name: N/A    Number of Children: N/A  . Years of Education: N/A   Occupational History  . Not on file.   Social History Main Topics  . Smoking status: Never Smoker   . Smokeless tobacco: Never Used  . Alcohol Use: Yes  . Drug Use: No  . Sexually Active: Not on file   Other Topics Concern  . Not on file   Social History Narrative  . No narrative on file   Health Maintenance  Topic Date Due  . Colonoscopy  09/15/1988  . Zostavax  09/15/1998  . Influenza Vaccine  04/19/2011  . Tetanus/tdap  07/05/2016  . Pneumococcal Polysaccharide Vaccine Age 37 And Over  Completed    The following portions of the patient's history were reviewed and updated as appropriate: allergies, current medications, past family history, past medical history, past social history, past surgical history and problem list.  Review of Systems  Review of Systems  Constitutional: Negative for activity change, appetite change and fatigue.  HENT: Negative for hearing loss, congestion, tinnitus and ear discharge.  dentist--q1y Eyes: Negative for visual disturbance (see optho q1y -- vision corrected to 20/20 with glasses).  Respiratory: Negative for cough, chest tightness and shortness of breath.   Cardiovascular: Negative for chest pain, palpitations and leg swelling.  Gastrointestinal: Negative for abdominal pain, diarrhea, constipation and abdominal distention.  Genitourinary: Negative for urgency, frequency, decreased urine volume and difficulty urinating.  Musculoskeletal: Negative for back pain, arthralgias and gait problem.  Skin: Negative for color change, pallor and rash.  Neurological: Negative for dizziness, light-headedness, numbness and headaches.  Hematological: Negative for adenopathy. Does not  bruise/bleed easily.  Psychiatric/Behavioral: Negative for suicidal ideas, confusion, sleep disturbance, self-injury, dysphoric mood, decreased concentration and agitation.  Pt is able to read and write and can do all ADLs No risk for falling No abuse/ violence in home  optho--[facility]--[facility]--[place] [facility]    Objective:    BP 140/82  Pulse 66  Temp(Src) 98.9 F (37.2 C) (Oral)  Ht 5\' 1"  (1.549 m)  Wt 156 lb (70.761 kg)  BMI 29.48 kg/m2  SpO2 97%  LMP 07/19/1988 General appearance: alert, cooperative, appears stated age and mild distress Head: Normocephalic, without obvious abnormality, atraumatic Eyes: conjunctivae/corneas clear. PERRL, EOM's intact. Fundi benign. Ears: normal TM's and external ear canals both ears Nose: Nares normal. Septum midline. Mucosa normal. No drainage or sinus tenderness. Throat: lips, mucosa, and tongue normal; teeth and gums normal Neck: no adenopathy, no carotid bruit, no JVD, supple, symmetrical, trachea midline and thyroid not enlarged, symmetric, no tenderness/mass/nodules Lungs: clear to auscultation bilaterally Breasts: normal appearance, no masses or tenderness Heart: regular rate and rhythm, S1, S2 normal, no murmur, click, rub or gallop Abdomen: soft, non-tender; bowel sounds normal; no masses,  no organomegaly Pelvic: cervix normal in appearance, external genitalia normal, no adnexal masses or tenderness, no cervical motion tenderness, rectovaginal septum normal, uterus normal size, shape, and consistency and vagina normal without discharge Extremities: extremities normal, atraumatic, no cyanosis or edema Pulses: 2+ and symmetric Skin: Skin color, texture, turgor normal. No rashes or lesions  Lymph nodes: Cervical, supraclavicular, and axillary nodes normal. Neurologic: Grossly normal    Assessment:    Healthy female exam.     hyperlipidemia  Plan:     ghm utd Check mammo rto 6 months sbe See After Visit Summary for  Counseling Recommendations

## 2010-12-31 ENCOUNTER — Encounter: Payer: Self-pay | Admitting: *Deleted

## 2011-05-11 ENCOUNTER — Ambulatory Visit (INDEPENDENT_AMBULATORY_CARE_PROVIDER_SITE_OTHER): Payer: Medicare Other

## 2011-05-11 DIAGNOSIS — Z23 Encounter for immunization: Secondary | ICD-10-CM

## 2011-05-13 ENCOUNTER — Ambulatory Visit: Payer: Medicare Other

## 2011-08-11 ENCOUNTER — Encounter: Payer: Self-pay | Admitting: Internal Medicine

## 2011-10-17 ENCOUNTER — Emergency Department (HOSPITAL_COMMUNITY): Payer: No Typology Code available for payment source

## 2011-10-17 ENCOUNTER — Encounter (HOSPITAL_COMMUNITY): Payer: Self-pay | Admitting: *Deleted

## 2011-10-17 ENCOUNTER — Emergency Department (HOSPITAL_COMMUNITY)
Admission: EM | Admit: 2011-10-17 | Discharge: 2011-10-17 | Disposition: A | Discharge status: Home / Self Care | Payer: No Typology Code available for payment source | Attending: Emergency Medicine | Admitting: Emergency Medicine

## 2011-10-17 DIAGNOSIS — E785 Hyperlipidemia, unspecified: Secondary | ICD-10-CM | POA: Insufficient documentation

## 2011-10-17 DIAGNOSIS — R51 Headache: Secondary | ICD-10-CM | POA: Insufficient documentation

## 2011-10-17 DIAGNOSIS — M5030 Other cervical disc degeneration, unspecified cervical region: Secondary | ICD-10-CM | POA: Insufficient documentation

## 2011-10-17 DIAGNOSIS — Z79899 Other long term (current) drug therapy: Secondary | ICD-10-CM | POA: Insufficient documentation

## 2011-10-17 DIAGNOSIS — S20219A Contusion of unspecified front wall of thorax, initial encounter: Secondary | ICD-10-CM | POA: Insufficient documentation

## 2011-10-17 LAB — POCT I-STAT, CHEM 8
Calcium, Ion: 1.13 mmol/L (ref 1.12–1.32)
Glucose, Bld: 111 mg/dL — ABNORMAL HIGH (ref 70–99)
HCT: 37 % (ref 36.0–46.0)
Hemoglobin: 12.6 g/dL (ref 12.0–15.0)

## 2011-10-17 MED ORDER — IOHEXOL 300 MG/ML  SOLN
75.0000 mL | Freq: Once | INTRAMUSCULAR | Status: AC | PRN
  Administered 2011-10-17: 75 mL via INTRAVENOUS

## 2011-10-17 MED ORDER — ONDANSETRON HCL 4 MG/2ML IJ SOLN
4.0000 mg | Freq: Once | INTRAMUSCULAR | Status: AC
  Administered 2011-10-17: 4 mg via INTRAVENOUS
  Filled 2011-10-17: qty 2

## 2011-10-17 MED ORDER — CYCLOBENZAPRINE HCL 10 MG PO TABS: 10.0000 mg | ORAL_TABLET | Freq: Two times a day (BID) | ORAL | Status: AC | PRN

## 2011-10-17 MED ORDER — HYDROCORTISONE SOD SUCCINATE 100 MG IJ SOLR
200.0000 mg | Freq: Once | INTRAMUSCULAR | Status: AC
  Administered 2011-10-17: 200 mg via INTRAVENOUS
  Filled 2011-10-17: qty 4

## 2011-10-17 MED ORDER — ONDANSETRON HCL 4 MG/2ML IJ SOLN
INTRAMUSCULAR | Status: AC
  Filled 2011-10-17: qty 2

## 2011-10-17 MED ORDER — FENTANYL CITRATE 0.05 MG/ML IJ SOLN
100.0000 ug | Freq: Once | INTRAMUSCULAR | Status: AC
  Administered 2011-10-17: 100 ug via INTRAVENOUS
  Filled 2011-10-17: qty 2

## 2011-10-17 MED ORDER — HYDROCODONE-ACETAMINOPHEN 5-325 MG PO TABS: 2.0000 | ORAL_TABLET | ORAL | Status: DC | PRN

## 2011-10-17 MED ORDER — ONDANSETRON HCL 4 MG/2ML IJ SOLN
4.0000 mg | Freq: Once | INTRAMUSCULAR | Status: AC
  Administered 2011-10-17: 4 mg via INTRAVENOUS

## 2011-10-17 MED ORDER — DIPHENHYDRAMINE HCL 25 MG PO CAPS
50.0000 mg | ORAL_CAPSULE | Freq: Once | ORAL | Status: AC
  Administered 2011-10-17: 50 mg via ORAL
  Filled 2011-10-17: qty 2
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
## 2011-10-17 NOTE — ED Notes (Signed)
Patient returned from CT and remains on monitor with 2L oxygen with sats of 100%. Patient states she feel nauseated.

## 2011-10-17 NOTE — ED Notes (Signed)
Patient returned from CT

## 2011-10-17 NOTE — ED Notes (Signed)
Patient remains on monitor and 2L oxygen with sats of 98%. Patient resting with NAD at this time. Family at bedside.

## 2011-10-17 NOTE — ED Notes (Signed)
Patient transported to CT 

## 2011-10-17 NOTE — ED Notes (Signed)
Patient remains on monitor and 2L oxygen with sats of 98%. Patient denies pain in her left shoulder/upper chest area as long as she does not move around in the bed. Family at bedside.
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
## 2011-10-17 NOTE — ED Notes (Addendum)
Patient states she is feeling dizzy and SOB and needs pain medication. Patient's daughter states the bluish mark on her mothers face at the temple area is not normal for her mother.

## 2011-10-17 NOTE — ED Notes (Signed)
Patient given meal and monitoring discontinued. Patient getting dressed and preparing for discharge home with daughter.

## 2011-10-18 ENCOUNTER — Telehealth: Payer: Self-pay | Admitting: Family Medicine
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
## 2011-10-18 NOTE — Telephone Encounter (Signed)
Please scheduled patient for a hospital follow up..30 mins please    KP
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
## 2011-10-18 NOTE — Telephone Encounter (Signed)
Already received this---pt should have f/u ov

## 2011-10-18 NOTE — Telephone Encounter (Signed)
Pt should have ov 
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
## 2011-10-22 ENCOUNTER — Encounter: Payer: Self-pay | Admitting: Family Medicine

## 2011-10-22 ENCOUNTER — Ambulatory Visit (INDEPENDENT_AMBULATORY_CARE_PROVIDER_SITE_OTHER): Payer: No Typology Code available for payment source | Admitting: Family Medicine

## 2011-10-22 DIAGNOSIS — S2020XA Contusion of thorax, unspecified, initial encounter: Secondary | ICD-10-CM

## 2011-10-22 DIAGNOSIS — N390 Urinary tract infection, site not specified: Secondary | ICD-10-CM

## 2011-10-22 MED ORDER — HYDROCODONE-ACETAMINOPHEN 5-325 MG PO TABS: 2.0000 | ORAL_TABLET | ORAL | Status: DC | PRN

## 2011-10-22 MED ORDER — SULFAMETHOXAZOLE-TRIMETHOPRIM 800-160 MG PO TABS: 1.0000 | ORAL_TABLET | Freq: Two times a day (BID) | ORAL | Status: DC
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
## 2011-10-25 ENCOUNTER — Telehealth: Payer: Self-pay | Admitting: Family Medicine

## 2011-10-25 NOTE — Telephone Encounter (Signed)
i don't think a cxr will help---cxr looks at lungs not bones so much---I believe the clicking was in the sternum and cT saw no bone abnormalitis.
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
## 2011-10-25 NOTE — Telephone Encounter (Signed)
msg left to call the office     KP 

## 2011-10-28 ENCOUNTER — Other Ambulatory Visit: Payer: Self-pay | Admitting: Cardiothoracic Surgery

## 2011-10-28 NOTE — Telephone Encounter (Signed)
msg left to call the office     KP 

## 2011-10-29 NOTE — Telephone Encounter (Signed)
Letter mailed to contact the office.     KP 

## 2011-11-02 ENCOUNTER — Ambulatory Visit (INDEPENDENT_AMBULATORY_CARE_PROVIDER_SITE_OTHER): Payer: Medicare Other | Admitting: Family Medicine

## 2011-11-02 VITALS — BP 135/72 | HR 83 | Temp 98.5°F | Ht 61.75 in | Wt 160.2 lb

## 2011-11-02 DIAGNOSIS — N63 Unspecified lump in unspecified breast: Secondary | ICD-10-CM | POA: Insufficient documentation
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
## 2011-11-03 ENCOUNTER — Telehealth: Payer: Self-pay | Admitting: *Deleted

## 2011-11-03 DIAGNOSIS — N644 Mastodynia: Secondary | ICD-10-CM

## 2011-11-03 NOTE — Telephone Encounter (Signed)
Last mammo was 11/18/10 at [facility]- in the computer under imaging

## 2011-11-03 NOTE — Telephone Encounter (Signed)
FYI,Karen from [facility] called to ask when pt last mammogram was, unable to locate information in pt chart concerning last mammogram ,per [facility] MD will not perform just [place] in this situation, rep advised to place future order for Diagnostic Bi-Lateral Mammogram in pt chart, she will clarify with pt when her last mammogram was and if the order needs to be changed she will call back to advise, future order placed in chart and Clydie Braun at [facility] did not the order before ending the call

## 2011-11-09 ENCOUNTER — Encounter: Payer: Self-pay | Admitting: Family Medicine

## 2011-11-22 ENCOUNTER — Ambulatory Visit: Payer: No Typology Code available for payment source | Admitting: Family Medicine

## 2011-12-09 ENCOUNTER — Ambulatory Visit (INDEPENDENT_AMBULATORY_CARE_PROVIDER_SITE_OTHER): Payer: Medicare Other | Admitting: Family Medicine

## 2011-12-09 ENCOUNTER — Other Ambulatory Visit (HOSPITAL_COMMUNITY)
Admission: RE | Admit: 2011-12-09 | Discharge: 2011-12-09 | Disposition: A | Discharge status: Home / Self Care | Payer: Medicare Other | Source: Ambulatory Visit | Attending: Family Medicine | Admitting: Family Medicine

## 2011-12-09 ENCOUNTER — Encounter: Payer: Self-pay | Admitting: Family Medicine

## 2011-12-09 VITALS — BP 110/82 | HR 74 | Temp 98.4°F | Ht 62.0 in | Wt 161.0 lb

## 2011-12-09 DIAGNOSIS — N390 Urinary tract infection, site not specified: Secondary | ICD-10-CM

## 2011-12-09 DIAGNOSIS — Z0000 Encounter for general adult medical examination without abnormal findings: Secondary | ICD-10-CM

## 2011-12-09 DIAGNOSIS — E785 Hyperlipidemia, unspecified: Secondary | ICD-10-CM

## 2011-12-09 DIAGNOSIS — Z01419 Encounter for gynecological examination (general) (routine) without abnormal findings: Secondary | ICD-10-CM | POA: Insufficient documentation

## 2011-12-09 DIAGNOSIS — Z780 Asymptomatic menopausal state: Secondary | ICD-10-CM

## 2011-12-09 LAB — LIPID PANEL
HDL: 56.3 mg/dL (ref >39.00)
LDL Cholesterol: 98 mg/dL (ref 0–99)
Total CHOL/HDL Ratio: 3
Triglycerides: 84 mg/dL (ref 0.0–149.0)
VLDL: 16.8 mg/dL (ref 0.0–40.0)

## 2011-12-09 LAB — HEPATIC FUNCTION PANEL
Alkaline Phosphatase: 67 U/L (ref 39–117)
Bilirubin, Direct: 0 mg/dL (ref 0.0–0.3)
Total Bilirubin: 0.5 mg/dL (ref 0.3–1.2)
Total Protein: 7.5 g/dL (ref 6.0–8.3)

## 2011-12-09 LAB — BASIC METABOLIC PANEL
BUN: 11 mg/dL (ref 6–23)
Calcium: 9.3 mg/dL (ref 8.4–10.5)
Creatinine, Ser: 0.7 mg/dL (ref 0.4–1.2)

## 2011-12-09 LAB — POCT URINALYSIS DIPSTICK
Bilirubin, UA: NEGATIVE
Spec Grav, UA: <=1.005

## 2011-12-09 LAB — CBC WITH DIFFERENTIAL/PLATELET
Basophils Relative: 0.4 % (ref 0.0–3.0)
Eosinophils Absolute: 0.1 10*3/uL (ref 0.0–0.7)
Eosinophils Relative: 1.3 % (ref 0.0–5.0)
Lymphocytes Relative: 23.3 % (ref 12.0–46.0)
MCHC: 32.3 g/dL (ref 30.0–36.0)
Neutrophils Relative %: 66.7 % (ref 43.0–77.0)
Platelets: 275 10*3/uL (ref 150.0–400.0)
RBC: 4.05 Mil/uL (ref 3.87–5.11)
WBC: 5.6 10*3/uL (ref 4.5–10.5)

## 2011-12-09 NOTE — Progress Notes (Signed)
Addended by: Silvio Pate D on: 12/09/2011 05:02 PM   Modules accepted: Orders

## 2011-12-09 NOTE — Patient Instructions (Signed)
Preventive Care for Adults, Female A healthy lifestyle and preventive care can promote health and wellness. Preventive health guidelines for women include the following key practices.  A routine yearly physical is a good way to check with your caregiver about your health and preventive screening. It is a chance to share any concerns and updates on your health, and to receive a thorough exam.   Visit your dentist for a routine exam and preventive care every 6 months. Brush your teeth twice a day and floss once a day. Good oral hygiene prevents tooth decay and gum disease.   The frequency of eye exams is based on your age, health, family medical history, use of contact lenses, and other factors. Follow your caregiver's recommendations for frequency of eye exams.   Eat a healthy diet. Foods like vegetables, fruits, whole grains, low-fat dairy products, and lean protein foods contain the nutrients you need without too many calories. Decrease your intake of foods high in solid fats, added sugars, and salt. Eat the right amount of calories for you.Get information about a proper diet from your caregiver, if necessary.   Regular physical exercise is one of the most important things you can do for your health. Most adults should get at least 150 minutes of moderate-intensity exercise (any activity that increases your heart rate and causes you to sweat) each week. In addition, most adults need muscle-strengthening exercises on 2 or more days a week.   Maintain a healthy weight. The body mass index (BMI) is a screening tool to identify possible weight problems. It provides an estimate of body fat based on height and weight. Your caregiver can help determine your BMI, and can help you achieve or maintain a healthy weight.For adults 20 years and older:   A BMI below 18.5 is considered underweight.   A BMI of 18.5 to 24.9 is normal.   A BMI of 25 to 29.9 is considered overweight.   A BMI of 30 and above is  considered obese.   Maintain normal blood lipids and cholesterol levels by exercising and minimizing your intake of saturated fat. Eat a balanced diet with plenty of fruit and vegetables. Blood tests for lipids and cholesterol should begin at age 20 and be repeated every 5 years. If your lipid or cholesterol levels are high, you are over 50, or you are at high risk for heart disease, you may need your cholesterol levels checked more frequently.Ongoing high lipid and cholesterol levels should be treated with medicines if diet and exercise are not effective.   If you smoke, find out from your caregiver how to quit. If you do not use tobacco, do not start.   If you are pregnant, do not drink alcohol. If you are breastfeeding, be very cautious about drinking alcohol. If you are not pregnant and choose to drink alcohol, do not exceed 1 drink per day. One drink is considered to be 12 ounces (355 mL) of beer, 5 ounces (148 mL) of wine, or 1.5 ounces (44 mL) of liquor.   Avoid use of street drugs. Do not share needles with anyone. Ask for help if you need support or instructions about stopping the use of drugs.   High blood pressure causes heart disease and increases the risk of stroke. Your blood pressure should be checked at least every 1 to 2 years. Ongoing high blood pressure should be treated with medicines if weight loss and exercise are not effective.   If you are 55 to 73   years old, ask your caregiver if you should take aspirin to prevent strokes.   Diabetes screening involves taking a blood sample to check your fasting blood sugar level. This should be done once every 3 years, after age 45, if you are within normal weight and without risk factors for diabetes. Testing should be considered at a younger age or be carried out more frequently if you are overweight and have at least 1 risk factor for diabetes.   Breast cancer screening is essential preventive care for women. You should practice "breast  self-awareness." This means understanding the normal appearance and feel of your breasts and may include breast self-examination. Any changes detected, no matter how small, should be reported to a caregiver. Women in their 20s and 30s should have a clinical breast exam (CBE) by a caregiver as part of a regular health exam every 1 to 3 years. After age 40, women should have a CBE every year. Starting at age 40, women should consider having a mammography (breast X-ray test) every year. Women who have a family history of breast cancer should talk to their caregiver about genetic screening. Women at a high risk of breast cancer should talk to their caregivers about having magnetic resonance imaging (MRI) and a mammography every year.   The Pap test is a screening test for cervical cancer. A Pap test can show cell changes on the cervix that might become cervical cancer if left untreated. A Pap test is a procedure in which cells are obtained and examined from the lower end of the uterus (cervix).   Women should have a Pap test starting at age 21.   Between ages 21 and 29, Pap tests should be repeated every 2 years.   Beginning at age 30, you should have a Pap test every 3 years as long as the past 3 Pap tests have been normal.   Some women have medical problems that increase the chance of getting cervical cancer. Talk to your caregiver about these problems. It is especially important to talk to your caregiver if a new problem develops soon after your last Pap test. In these cases, your caregiver may recommend more frequent screening and Pap tests.   The above recommendations are the same for women who have or have not gotten the vaccine for human papillomavirus (HPV).   If you had a hysterectomy for a problem that was not cancer or a condition that could lead to cancer, then you no longer need Pap tests. Even if you no longer need a Pap test, a regular exam is a good idea to make sure no other problems are  starting.   If you are between ages 65 and 70, and you have had normal Pap tests going back 10 years, you no longer need Pap tests. Even if you no longer need a Pap test, a regular exam is a good idea to make sure no other problems are starting.   If you have had past treatment for cervical cancer or a condition that could lead to cancer, you need Pap tests and screening for cancer for at least 20 years after your treatment.   If Pap tests have been discontinued, risk factors (such as a new sexual partner) need to be reassessed to determine if screening should be resumed.   The HPV test is an additional test that may be used for cervical cancer screening. The HPV test looks for the virus that can cause the cell changes on the cervix.   The cells collected during the Pap test can be tested for HPV. The HPV test could be used to screen women aged 30 years and older, and should be used in women of any age who have unclear Pap test results. After the age of 30, women should have HPV testing at the same frequency as a Pap test.   Colorectal cancer can be detected and often prevented. Most routine colorectal cancer screening begins at the age of 50 and continues through age 75. However, your caregiver may recommend screening at an earlier age if you have risk factors for colon cancer. On a yearly basis, your caregiver may provide home test kits to check for hidden blood in the stool. Use of a small camera at the end of a tube, to directly examine the colon (sigmoidoscopy or colonoscopy), can detect the earliest forms of colorectal cancer. Talk to your caregiver about this at age 50, when routine screening begins. Direct examination of the colon should be repeated every 5 to 10 years through age 75, unless early forms of pre-cancerous polyps or small growths are found.   Hepatitis C blood testing is recommended for all people born from 1945 through 1965 and any individual with known risks for hepatitis C.    Practice safe sex. Use condoms and avoid high-risk sexual practices to reduce the spread of sexually transmitted infections (STIs). STIs include gonorrhea, chlamydia, syphilis, trichomonas, herpes, HPV, and human immunodeficiency virus (HIV). Herpes, HIV, and HPV are viral illnesses that have no cure. They can result in disability, cancer, and death. Sexually active women aged 25 and younger should be checked for chlamydia. Older women with new or multiple partners should also be tested for chlamydia. Testing for other STIs is recommended if you are sexually active and at increased risk.   Osteoporosis is a disease in which the bones lose minerals and strength with aging. This can result in serious bone fractures. The risk of osteoporosis can be identified using a bone density scan. Women ages 65 and over and women at risk for fractures or osteoporosis should discuss screening with their caregivers. Ask your caregiver whether you should take a calcium supplement or vitamin D to reduce the rate of osteoporosis.   Menopause can be associated with physical symptoms and risks. Hormone replacement therapy is available to decrease symptoms and risks. You should talk to your caregiver about whether hormone replacement therapy is right for you.   Use sunscreen with sun protection factor (SPF) of 30 or more. Apply sunscreen liberally and repeatedly throughout the day. You should seek shade when your shadow is shorter than you. Protect yourself by wearing long sleeves, pants, a wide-brimmed hat, and sunglasses year round, whenever you are outdoors.   Once a month, do a whole body skin exam, using a mirror to look at the skin on your back. Notify your caregiver of new moles, moles that have irregular borders, moles that are larger than a pencil eraser, or moles that have changed in shape or color.   Stay current with required immunizations.   Influenza. You need a dose every fall (or winter). The composition of  the flu vaccine changes each year, so being vaccinated once is not enough.   Pneumococcal polysaccharide. You need 1 to 2 doses if you smoke cigarettes or if you have certain chronic medical conditions. You need 1 dose at age 65 (or older) if you have never been vaccinated.   Tetanus, diphtheria, pertussis (Tdap, Td). Get 1 dose of   Tdap vaccine if you are younger than age 65, are over 65 and have contact with an infant, are a healthcare worker, are pregnant, or simply want to be protected from whooping cough. After that, you need a Td booster dose every 10 years. Consult your caregiver if you have not had at least 3 tetanus and diphtheria-containing shots sometime in your life or have a deep or dirty wound.   HPV. You need this vaccine if you are a woman age 26 or younger. The vaccine is given in 3 doses over 6 months.   Measles, mumps, rubella (MMR). You need at least 1 dose of MMR if you were born in 1957 or later. You may also need a second dose.   Meningococcal. If you are age 19 to 21 and a first-year college student living in a residence hall, or have one of several medical conditions, you need to get vaccinated against meningococcal disease. You may also need additional booster doses.   Zoster (shingles). If you are age 60 or older, you should get this vaccine.   Varicella (chickenpox). If you have never had chickenpox or you were vaccinated but received only 1 dose, talk to your caregiver to find out if you need this vaccine.   Hepatitis A. You need this vaccine if you have a specific risk factor for hepatitis A virus infection or you simply wish to be protected from this disease. The vaccine is usually given as 2 doses, 6 to 18 months apart.   Hepatitis B. You need this vaccine if you have a specific risk factor for hepatitis B virus infection or you simply wish to be protected from this disease. The vaccine is given in 3 doses, usually over 6 months.  Preventive Services /  Frequency Ages 19 to 39  Blood pressure check.** / Every 1 to 2 years.   Lipid and cholesterol check.** / Every 5 years beginning at age 20.   Clinical breast exam.** / Every 3 years for women in their 20s and 30s.   Pap test.** / Every 2 years from ages 21 through 29. Every 3 years starting at age 30 through age 65 or 70 with a history of 3 consecutive normal Pap tests.   HPV screening.** / Every 3 years from ages 30 through ages 65 to 70 with a history of 3 consecutive normal Pap tests.   Hepatitis C blood test.** / For any individual with known risks for hepatitis C.   Skin self-exam. / Monthly.   Influenza immunization.** / Every year.   Pneumococcal polysaccharide immunization.** / 1 to 2 doses if you smoke cigarettes or if you have certain chronic medical conditions.   Tetanus, diphtheria, pertussis (Tdap, Td) immunization. / A one-time dose of Tdap vaccine. After that, you need a Td booster dose every 10 years.   HPV immunization. / 3 doses over 6 months, if you are 26 and younger.   Measles, mumps, rubella (MMR) immunization. / You need at least 1 dose of MMR if you were born in 1957 or later. You may also need a second dose.   Meningococcal immunization. / 1 dose if you are age 19 to 21 and a first-year college student living in a residence hall, or have one of several medical conditions, you need to get vaccinated against meningococcal disease. You may also need additional booster doses.   Varicella immunization.** / Consult your caregiver.   Hepatitis A immunization.** / Consult your caregiver. 2 doses, 6 to 18 months   apart.   Hepatitis B immunization.** / Consult your caregiver. 3 doses usually over 6 months.  Ages 40 to 64  Blood pressure check.** / Every 1 to 2 years.   Lipid and cholesterol check.** / Every 5 years beginning at age 20.   Clinical breast exam.** / Every year after age 40.   Mammogram.** / Every year beginning at age 40 and continuing for as  long as you are in good health. Consult with your caregiver.   Pap test.** / Every 3 years starting at age 30 through age 65 or 70 with a history of 3 consecutive normal Pap tests.   HPV screening.** / Every 3 years from ages 30 through ages 65 to 70 with a history of 3 consecutive normal Pap tests.   Fecal occult blood test (FOBT) of stool. / Every year beginning at age 50 and continuing until age 75. You may not need to do this test if you get a colonoscopy every 10 years.   Flexible sigmoidoscopy or colonoscopy.** / Every 5 years for a flexible sigmoidoscopy or every 10 years for a colonoscopy beginning at age 50 and continuing until age 75.   Hepatitis C blood test.** / For all people born from 1945 through 1965 and any individual with known risks for hepatitis C.   Skin self-exam. / Monthly.   Influenza immunization.** / Every year.   Pneumococcal polysaccharide immunization.** / 1 to 2 doses if you smoke cigarettes or if you have certain chronic medical conditions.   Tetanus, diphtheria, pertussis (Tdap, Td) immunization.** / A one-time dose of Tdap vaccine. After that, you need a Td booster dose every 10 years.   Measles, mumps, rubella (MMR) immunization. / You need at least 1 dose of MMR if you were born in 1957 or later. You may also need a second dose.   Varicella immunization.** / Consult your caregiver.   Meningococcal immunization.** / Consult your caregiver.   Hepatitis A immunization.** / Consult your caregiver. 2 doses, 6 to 18 months apart.   Hepatitis B immunization.** / Consult your caregiver. 3 doses, usually over 6 months.  Ages 65 and over  Blood pressure check.** / Every 1 to 2 years.   Lipid and cholesterol check.** / Every 5 years beginning at age 20.   Clinical breast exam.** / Every year after age 40.   Mammogram.** / Every year beginning at age 40 and continuing for as long as you are in good health. Consult with your caregiver.   Pap test.** /  Every 3 years starting at age 30 through age 65 or 70 with a 3 consecutive normal Pap tests. Testing can be stopped between 65 and 70 with 3 consecutive normal Pap tests and no abnormal Pap or HPV tests in the past 10 years.   HPV screening.** / Every 3 years from ages 30 through ages 65 or 70 with a history of 3 consecutive normal Pap tests. Testing can be stopped between 65 and 70 with 3 consecutive normal Pap tests and no abnormal Pap or HPV tests in the past 10 years.   Fecal occult blood test (FOBT) of stool. / Every year beginning at age 50 and continuing until age 75. You may not need to do this test if you get a colonoscopy every 10 years.   Flexible sigmoidoscopy or colonoscopy.** / Every 5 years for a flexible sigmoidoscopy or every 10 years for a colonoscopy beginning at age 50 and continuing until age 75.   Hepatitis   C blood test.** / For all people born from 1945 through 1965 and any individual with known risks for hepatitis C.   Osteoporosis screening.** / A one-time screening for women ages 65 and over and women at risk for fractures or osteoporosis.   Skin self-exam. / Monthly.   Influenza immunization.** / Every year.   Pneumococcal polysaccharide immunization.** / 1 dose at age 65 (or older) if you have never been vaccinated.   Tetanus, diphtheria, pertussis (Tdap, Td) immunization. / A one-time dose of Tdap vaccine if you are over 65 and have contact with an infant, are a healthcare worker, or simply want to be protected from whooping cough. After that, you need a Td booster dose every 10 years.   Varicella immunization.** / Consult your caregiver.   Meningococcal immunization.** / Consult your caregiver.   Hepatitis A immunization.** / Consult your caregiver. 2 doses, 6 to 18 months apart.   Hepatitis B immunization.** / Check with your caregiver. 3 doses, usually over 6 months.  ** Family history and personal history of risk and conditions may change your caregiver's  recommendations. Document Released: 08/31/2001 Document Revised: 06/24/2011 Document Reviewed: 11/30/2010 [employer] Patient Information 2012 [employer]. 

## 2011-12-09 NOTE — Assessment & Plan Note (Signed)
Check labs con't meds 
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
## 2011-12-09 NOTE — Progress Notes (Signed)
Addended byDuaine Dredge, Sunny Schlein L on: 12/09/2011 01:30 PM   Modules accepted: Orders

## 2011-12-12 LAB — URINE CULTURE: Colony Count: >=100000

## 2011-12-15 ENCOUNTER — Other Ambulatory Visit: Payer: Self-pay | Admitting: Family Medicine

## 2011-12-15 MED ORDER — CIPROFLOXACIN HCL 500 MG PO TABS: 500.0000 mg | ORAL_TABLET | Freq: Two times a day (BID) | ORAL | Status: AC

## 2011-12-29 ENCOUNTER — Encounter: Payer: Self-pay | Admitting: Cardiothoracic Surgery

## 2011-12-29 ENCOUNTER — Ambulatory Visit (INDEPENDENT_AMBULATORY_CARE_PROVIDER_SITE_OTHER): Payer: Medicare Other | Admitting: Cardiothoracic Surgery

## 2011-12-29 VITALS — BP 180/86 | HR 74 | Resp 20 | Ht 61.0 in | Wt 162.0 lb

## 2011-12-29 DIAGNOSIS — I10 Essential (primary) hypertension: Secondary | ICD-10-CM

## 2011-12-29 DIAGNOSIS — I712 Thoracic aortic aneurysm, without rupture: Secondary | ICD-10-CM
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
## 2011-12-29 NOTE — Patient Instructions (Signed)
Your blood pressure today was to high at 178/80 and a prescription for Toprol-XL 25 mg daily as prescribed Take your blood pressure home once a week and record. Bring the record to your physician visits High blood pressure could cause a stroke or cause your aorta to further enlarge and dissect or tear.

## 2012-02-10 ENCOUNTER — Encounter: Payer: Self-pay | Admitting: Family Medicine

## 2012-02-27 ENCOUNTER — Encounter (HOSPITAL_COMMUNITY): Payer: Self-pay | Admitting: Family Medicine

## 2012-02-27 ENCOUNTER — Inpatient Hospital Stay (HOSPITAL_COMMUNITY): Payer: Medicare Other

## 2012-02-27 ENCOUNTER — Inpatient Hospital Stay (HOSPITAL_COMMUNITY)
Admission: EM | Admit: 2012-02-27 | Discharge: 2012-03-01 | DRG: 690 | Disposition: A | Discharge status: Home / Self Care | Payer: Medicare Other | Attending: Internal Medicine | Admitting: Internal Medicine

## 2012-02-27 ENCOUNTER — Emergency Department (HOSPITAL_COMMUNITY): Payer: Medicare Other

## 2012-02-27 DIAGNOSIS — N644 Mastodynia: Secondary | ICD-10-CM

## 2012-02-27 DIAGNOSIS — N951 Menopausal and female climacteric states: Secondary | ICD-10-CM

## 2012-02-27 DIAGNOSIS — W19XXXA Unspecified fall, initial encounter: Secondary | ICD-10-CM | POA: Diagnosis present

## 2012-02-27 DIAGNOSIS — E785 Hyperlipidemia, unspecified: Secondary | ICD-10-CM | POA: Diagnosis present

## 2012-02-27 DIAGNOSIS — E119 Type 2 diabetes mellitus without complications: Secondary | ICD-10-CM | POA: Diagnosis present

## 2012-02-27 DIAGNOSIS — K5900 Constipation, unspecified: Secondary | ICD-10-CM

## 2012-02-27 DIAGNOSIS — E871 Hypo-osmolality and hyponatremia: Secondary | ICD-10-CM | POA: Diagnosis present

## 2012-02-27 DIAGNOSIS — IMO0001 Reserved for inherently not codable concepts without codable children: Secondary | ICD-10-CM

## 2012-02-27 DIAGNOSIS — A498 Other bacterial infections of unspecified site: Secondary | ICD-10-CM | POA: Diagnosis present

## 2012-02-27 DIAGNOSIS — Z9889 Other specified postprocedural states: Secondary | ICD-10-CM

## 2012-02-27 DIAGNOSIS — D239 Other benign neoplasm of skin, unspecified: Secondary | ICD-10-CM

## 2012-02-27 DIAGNOSIS — D72829 Elevated white blood cell count, unspecified: Secondary | ICD-10-CM

## 2012-02-27 DIAGNOSIS — Z7982 Long term (current) use of aspirin: Secondary | ICD-10-CM

## 2012-02-27 DIAGNOSIS — Z833 Family history of diabetes mellitus: Secondary | ICD-10-CM

## 2012-02-27 DIAGNOSIS — F329 Major depressive disorder, single episode, unspecified: Secondary | ICD-10-CM | POA: Diagnosis present

## 2012-02-27 DIAGNOSIS — R55 Syncope and collapse: Secondary | ICD-10-CM

## 2012-02-27 DIAGNOSIS — R5383 Other fatigue: Secondary | ICD-10-CM | POA: Diagnosis present

## 2012-02-27 DIAGNOSIS — Z9189 Other specified personal risk factors, not elsewhere classified: Secondary | ICD-10-CM

## 2012-02-27 DIAGNOSIS — I712 Thoracic aortic aneurysm, without rupture, unspecified: Secondary | ICD-10-CM | POA: Diagnosis present

## 2012-02-27 DIAGNOSIS — R259 Unspecified abnormal involuntary movements: Secondary | ICD-10-CM | POA: Diagnosis present

## 2012-02-27 DIAGNOSIS — R5381 Other malaise: Secondary | ICD-10-CM | POA: Diagnosis present

## 2012-02-27 DIAGNOSIS — K219 Gastro-esophageal reflux disease without esophagitis: Secondary | ICD-10-CM | POA: Diagnosis present

## 2012-02-27 DIAGNOSIS — M6282 Rhabdomyolysis: Secondary | ICD-10-CM

## 2012-02-27 DIAGNOSIS — F3289 Other specified depressive episodes: Secondary | ICD-10-CM | POA: Diagnosis present

## 2012-02-27 DIAGNOSIS — N390 Urinary tract infection, site not specified: Principal | ICD-10-CM | POA: Diagnosis present

## 2012-02-27 DIAGNOSIS — M949 Disorder of cartilage, unspecified: Secondary | ICD-10-CM

## 2012-02-27 DIAGNOSIS — M899 Disorder of bone, unspecified: Secondary | ICD-10-CM

## 2012-02-27 DIAGNOSIS — R112 Nausea with vomiting, unspecified: Secondary | ICD-10-CM

## 2012-02-27 DIAGNOSIS — R531 Weakness: Secondary | ICD-10-CM

## 2012-02-27 DIAGNOSIS — N63 Unspecified lump in unspecified breast: Secondary | ICD-10-CM

## 2012-02-27 DIAGNOSIS — E876 Hypokalemia: Secondary | ICD-10-CM | POA: Diagnosis present

## 2012-02-27 DIAGNOSIS — M6281 Muscle weakness (generalized): Secondary | ICD-10-CM

## 2012-02-27 DIAGNOSIS — N6009 Solitary cyst of unspecified breast: Secondary | ICD-10-CM

## 2012-02-27 HISTORY — DX: Abdominal aortic aneurysm, without rupture: I71.4

## 2012-02-27 HISTORY — DX: Abdominal aortic aneurysm, without rupture, unspecified: I71.40

## 2012-02-27 LAB — URINE MICROSCOPIC-ADD ON

## 2012-02-27 LAB — CARDIAC PANEL(CRET KIN+CKTOT+MB+TROPI)
Relative Index: 1.4 (ref 0.0–2.5)
Total CK: 378 U/L — ABNORMAL HIGH (ref 7–177)
Troponin I: <0.3 ng/mL (ref <0.30)

## 2012-02-27 LAB — CBC WITH DIFFERENTIAL/PLATELET
Basophils Absolute: 0 10*3/uL (ref 0.0–0.1)
HCT: 35.8 % — ABNORMAL LOW (ref 36.0–46.0)
Hemoglobin: 12.2 g/dL (ref 12.0–15.0)
Lymphocytes Relative: 7 % — ABNORMAL LOW (ref 12–46)
Monocytes Absolute: 1 10*3/uL (ref 0.1–1.0)
Neutro Abs: 10.4 10*3/uL — ABNORMAL HIGH (ref 1.7–7.7)
RBC: 3.89 MIL/uL (ref 3.87–5.11)
RDW: 12.6 % (ref 11.5–15.5)
WBC: 12.3 10*3/uL — ABNORMAL HIGH (ref 4.0–10.5)

## 2012-02-27 LAB — URINALYSIS, ROUTINE W REFLEX MICROSCOPIC
Bilirubin Urine: NEGATIVE
Glucose, UA: NEGATIVE mg/dL
Ketones, ur: 40 mg/dL — AB
pH: 6.5 (ref 5.0–8.0)

## 2012-02-27 LAB — COMPREHENSIVE METABOLIC PANEL
ALT: 11 U/L (ref 0–35)
AST: 18 U/L (ref 0–37)
CO2: 26 mEq/L (ref 19–32)
Chloride: 97 mEq/L (ref 96–112)
Creatinine, Ser: 0.75 mg/dL (ref 0.50–1.10)
GFR calc non Af Amer: 82 mL/min — ABNORMAL LOW (ref >90)
Total Bilirubin: 0.8 mg/dL (ref 0.3–1.2)

## 2012-02-27 MED ORDER — LEVOFLOXACIN 500 MG PO TABS
500.0000 mg | ORAL_TABLET | Freq: Every day | ORAL | Status: DC
  Administered 2012-02-27 – 2012-02-28 (×2): 500 mg via ORAL
  Filled 2012-02-27 (×4): qty 1

## 2012-02-27 MED ORDER — SODIUM CHLORIDE 0.9 % IV SOLN
INTRAVENOUS | Status: DC
  Administered 2012-02-27: 22:00:00 via INTRAVENOUS

## 2012-02-27 MED ORDER — POTASSIUM CHLORIDE CRYS ER 20 MEQ PO TBCR
40.0000 meq | EXTENDED_RELEASE_TABLET | Freq: Once | ORAL | Status: AC
  Administered 2012-02-27: 40 meq via ORAL
  Filled 2012-02-27: qty 2

## 2012-02-27 MED ORDER — ONDANSETRON HCL 4 MG/2ML IJ SOLN
4.0000 mg | Freq: Three times a day (TID) | INTRAMUSCULAR | Status: AC | PRN
  Administered 2012-02-27: 4 mg via INTRAVENOUS
  Filled 2012-02-27: qty 2

## 2012-02-27 MED ORDER — ONDANSETRON HCL 4 MG/2ML IJ SOLN
4.0000 mg | Freq: Once | INTRAMUSCULAR | Status: AC
  Administered 2012-02-27: 4 mg via INTRAVENOUS
  Filled 2012-02-27: qty 2

## 2012-02-27 MED ORDER — SODIUM CHLORIDE 0.9 % IV BOLUS (SEPSIS)
1000.0000 mL | Freq: Once | INTRAVENOUS | Status: AC
  Administered 2012-02-27: 1000 mL via INTRAVENOUS

## 2012-02-27 MED ORDER — DEXTROSE 5 % IV SOLN
1.0000 g | Freq: Once | INTRAVENOUS | Status: AC
  Administered 2012-02-27: 1 g via INTRAVENOUS
  Filled 2012-02-27: qty 10

## 2012-02-27 NOTE — ED Notes (Signed)
Elizabeth Mccullough (daughter) : [phone] home [phone] cell

## 2012-02-27 NOTE — ED Notes (Signed)
Patient transported to X-ray 

## 2012-02-27 NOTE — ED Notes (Signed)
MD at bedside. 

## 2012-02-27 NOTE — ED Notes (Signed)
Pt's brief changed; meal given.

## 2012-02-27 NOTE — H&P (Signed)
[facility]                                                                                    Patient Demographics  Elizabeth Mccullough, is a 71 y.o. female  CSN: [id number]  MRN: [id number]  DOB - [birth year]  Admit Date - 02/27/2012  Outpatient Primary MD for the patient is Loreen Freud, DO   With History of -  Past Medical History  Diagnosis Date  . Hyperlipidemia   . AAA (abdominal aortic aneurysm)       Past Surgical History  Procedure Date  . Hammer toe surgery 04/10/02    Left Toe  . Breast cyst aspiration 1965    Right Breast  . Nasal septum surgery 1980    in for   Chief Complaint  Patient presents with  . Weakness  . Fall     HPI  Elizabeth Mccullough  is a 71 y.o. female, with history of stable thoracic aneurysm for which she saw her cardiothoracic surgeon few days ago Dr. Earney Mallet, dyslipidemia, question diabetes mellitus type 2 diet controlled, who was in her usual state of health however had an episode of feeling weak all over and fell in the laundry room today, she was on the floor for about an hour and was feeling weak all over and unable to get up, she was then brought to the ER, where her head CT and CT of C-spine were stable, her workup was consistent with UTI, was called to admit the patient for syncope and fall with UTI.  Her current review of systems is negative except for generalized weakness.    Review of Systems    In addition to the HPI above, No Fever-chills, No Headache, No changes with Vision or hearing, No problems swallowing food or Liquids, No Chest pain, Cough or Shortness of Breath, No Abdominal pain, No Nausea or Vommitting, Bowel movements are regular, No Blood in stool or Urine, No dysuria, No new skin rashes or bruises, No new joints pains-aches,  No new weakness, tingling, numbness in any extremity, feels weak all over No recent weight gain or loss, No polyuria, polydypsia or polyphagia, No significant  Mental Stressors.  A full 10 point Review of Systems was done, except as stated above, all other Review of Systems were negative.   Social History History  Substance Use Topics  . Smoking status: Never Smoker   . Smokeless tobacco: Never Used  . Alcohol Use: No     Family History Family History  Problem Relation Age of Onset  . Breast cancer    . Colon cancer    . Heart disease Mother   . Hyperlipidemia Sister   . Hyperlipidemia Brother   . Diabetes Sister   . Hyperlipidemia Sister   . Cancer Sister 58    colon  . Cancer Sister     breast  . Stroke Brother   . Hypertension Brother      Prior to Admission medications   Medication Sig Start Date End Date Taking? Authorizing Provider  aspirin EC 81 MG tablet Take 81 mg by mouth 2 (two) times daily.   Yes Historical Provider, MD  calcium-vitamin D (OSCAL WITH D) 500-200 MG-UNIT per tablet Take 1 tablet by mouth daily.   Yes Historical Provider, MD  docusate sodium (COLACE) 100 MG capsule Take 200 mg by mouth at bedtime. Takes 1 capsule in the morning and 2 capsules at night.   Yes Historical Provider, MD  Glucosamine-Chondroitin (GLUCOSAMINE CHONDR COMPLEX PO) Take 2 capsules by mouth daily.   Yes Historical Provider, MD  ibuprofen (ADVIL,MOTRIN) 200 MG tablet Take 200 mg by mouth every 6 (six) hours as needed. For pain   Yes Historical Provider, MD  Multiple Vitamins-Calcium (ONE-A-DAY WOMENS PO) Take 1 tablet by mouth daily.     Yes Historical Provider, MD  pravastatin (PRAVACHOL) 40 MG tablet Take 40 mg by mouth at bedtime.   Yes Historical Provider, MD  vitamin C (ASCORBIC ACID) 500 MG tablet Take 500 mg by mouth daily.   Yes Historical Provider, MD    Allergies  Allergen Reactions  . Penicillins Hives  . Iohexol Rash     Code: RASH, Desc: PATIENT STATES SHE IS ALLERGIC TO IV DYE. 20 YRS AGO SHE HAD A REACTION AT [facility], PT WAS GIVEN BENADRYL. 07/13/06/RM, Onset Date: [id number]     Physical  Exam  Vitals  Blood pressure 134/87, pulse 85, temperature 98.7 F (37.1 C), temperature source Oral, resp. rate 20, last menstrual period 07/19/1988, SpO2 97.00%.   1. General elderly Caucasian female lying in bed in NAD,    2. Normal affect and insight, Not Suicidal or Homicidal, Awake Alert, Oriented X 3.  3. No F.N deficits, ALL C.Nerves Intact, Strength 5/5 all 4 extremities, Sensation intact all 4 extremities, Plantars down going.  4. Ears and Eyes appear Normal, Conjunctivae clear, PERRLA. Moist Oral Mucosa.  5. Supple Neck, No JVD, No cervical lymphadenopathy appriciated, No Carotid Bruits.  6. Symmetrical Chest wall movement, Good air movement bilaterally, CTAB.  7. RRR, No Gallops, Rubs or Murmurs, No Parasternal Heave.  8. Positive Bowel Sounds, Abdomen Soft, Non tender, No organomegaly appriciated,No rebound -guarding or rigidity.  9.  No Cyanosis, Normal Skin Turgor, No Skin Rash or Bruise.  10. Good muscle tone,  joints appear normal , no effusions, Normal ROM.  11. No Palpable Lymph Nodes in Neck or Axillae    Data Review  CBC  Lab 02/27/12 1822  WBC 12.3*  HGB 12.2  HCT 35.8*  PLT 219  MCV 92.0  MCH 31.4  MCHC 34.1  RDW 12.6  LYMPHSABS 0.8  MONOABS 1.0  EOSABS 0.0  BASOSABS 0.0  BANDABS --   ------------------------------------------------------------------------------------------------------------------  Chemistries   Lab 02/27/12 1822  NA 133*  K 3.2*  CL 97  CO2 26  GLUCOSE 162*  BUN 13  CREATININE 0.75  CALCIUM 9.1  MG --  AST 18  ALT 11  ALKPHOS 65  BILITOT 0.8   ------------------------------------------------------------------------------------------------------------------ CrCl is unknown because both a height and weight (above a minimum accepted value) are required for this calculation. ------------------------------------------------------------------------------------------------------------------ No results found for  this basename: TSH,T4TOTAL,FREET3,T3FREE,THYROIDAB in the last 72 hours   Coagulation profile  Lab 02/27/12 1822  INR 1.03  PROTIME --   ------------------------------------------------------------------------------------------------------------------- No results found for this basename: DDIMER:2 in the last 72 hours -------------------------------------------------------------------------------------------------------------------  Cardiac Enzymes  Lab 02/27/12 1823  CKMB --  TROPONINI <0.30  MYOGLOBIN --   ------------------------------------------------------------------------------------------------------------------ No components found with this basename: POCBNP:3   ---------------------------------------------------------------------------------------------------------------  Urinalysis    Component Value Date/Time   COLORURINE YELLOW 02/27/2012 1857   APPEARANCEUR CLOUDY* 02/27/2012 1857  LABSPEC 1.013 02/27/2012 1857   PHURINE 6.5 02/27/2012 1857   GLUCOSEU NEGATIVE 02/27/2012 1857   HGBUR MODERATE* 02/27/2012 1857   HGBUR negative 12/05/2009 0000   BILIRUBINUR NEGATIVE 02/27/2012 1857   BILIRUBINUR Neg 12/09/2011 1636   KETONESUR 40* 02/27/2012 1857   PROTEINUR 100* 02/27/2012 1857   UROBILINOGEN 0.2 02/27/2012 1857   UROBILINOGEN 0.2 12/09/2011 1636   NITRITE POSITIVE* 02/27/2012 1857   NITRITE Positive 12/09/2011 1636   LEUKOCYTESUR LARGE* 02/27/2012 1857      Imaging results:   Dg Chest 2 View  02/27/2012  *RADIOLOGY REPORT*  Clinical Data: Shortness of breath post fall  CHEST - 2 VIEW  Comparison: 10/17/2011 CT  Findings: Mildly tortuous thoracic aorta.  Heart size normal. Lungs are clear.  No effusion.  Minimal mid thoracic spondylitic changes. No pneumothorax.  IMPRESSION:  No acute disease  Original Report Authenticated By: Osa Craver, M.D.   Ct Head Wo Contrast  02/27/2012  *RADIOLOGY REPORT*  Clinical Data:  Fall.  CT HEAD WITHOUT CONTRAST CT  CERVICAL SPINE WITHOUT CONTRAST  Technique:  Multidetector CT imaging of the head and cervical spine was performed following the standard protocol without intravenous contrast.  Multiplanar CT image reconstructions of the cervical spine were also generated.  Comparison:  10/17/2011  CT HEAD  Findings: Faint linear calcification noted in the globus pallidus nuclei.  Otherwise, The brain stem, cerebellum, cerebral peduncles, thalami, basal ganglia, basilar cisterns, and ventricular system appear unremarkable.  No intracranial hemorrhage, mass lesion, or acute infarction is identified.  Mild chronic ethmoid sinusitis noted.  IMPRESSION:  1.  Mild chronic ethmoid sinusitis.  No acute findings.  CT CERVICAL SPINE  Findings: Cervical spondylosis noted with fusion of the right facet joint at C2-3 and facet arthropathy on the left at C2-3, C3-4, and C4-5.  There is right facet arthropathy at C3-4.  This causes right osseous foraminal stenosis at the C3-4 level.  There is mild left osseous foraminal stenosis at C4-5 and C5-6.  No subluxation or cervical spine acute fracture is observed. Bilateral C7 cervical ribs are observed.  IMPRESSION:  1.  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 2.  Incidental bilateral C7 cervical ribs.  Original Report Authenticated By: Dellia Cloud, M.D.   Ct Cervical Spine Wo Contrast  02/27/2012  *RADIOLOGY REPORT*  Clinical Data:  Fall.  CT HEAD WITHOUT CONTRAST CT CERVICAL SPINE WITHOUT CONTRAST  Technique:  Multidetector CT imaging of the head and cervical spine was performed following the standard protocol without intravenous contrast.  Multiplanar CT image reconstructions of the cervical spine were also generated.  Comparison:  10/17/2011  CT HEAD  Findings: Faint linear calcification noted in the globus pallidus nuclei.  Otherwise, The brain stem, cerebellum, cerebral peduncles, thalami, basal ganglia, basilar  cisterns, and ventricular system appear unremarkable.  No intracranial hemorrhage, mass lesion, or acute infarction is identified.  Mild chronic ethmoid sinusitis noted.  IMPRESSION:  1.  Mild chronic ethmoid sinusitis.  No acute findings.  CT CERVICAL SPINE  Findings: Cervical spondylosis noted with fusion of the right facet joint at C2-3 and facet arthropathy on the left at C2-3, C3-4, and C4-5.  There is right facet arthropathy at C3-4.  This causes right osseous foraminal stenosis at the C3-4 level.  There is mild left osseous foraminal stenosis at C4-5 and C5-6.  No subluxation or cervical spine acute fracture is observed. Bilateral C7 cervical ribs are observed.  IMPRESSION:  1.  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 2.  Incidental bilateral C7 cervical ribs.  Original Report Authenticated By: Dellia Cloud, M.D.    My personal review of EKG: Rhythm NSR, Rate  83 /min,no Acute ST changes   Assessment & Plan  1. Near syncope and fall with generalized weakness - patient was on the floor for about an hour, he denies any headache chest pain or hip pain - most likely this was weakness brought on by UTI and dehydration - will be admitted to telemetry bed, cycle cardiac enzymes, obtain urine culture place on empiric Levaquin, IV fluids for hydration, replace low potassium, do not think this was dysrhythmia are cardiac in origin, however will obtain echo and carotid duplex for completeness sake, we'll monitor orthostatics, have PT see the patient in the morning. Since she was on the ground for one hour will obtain baseline CK levels, obtained 2 view hip x-ray and pelvic x-ray to rule out any fractures. Will check TSH.    2. Dehydration with hyponatremia and hypokalemia- IV fluids, gadolinium by mouth, repeat BMP in the morning.    3. History of thoracic aneurysm-which is stable per patient she saw her cardiothoracic surgeon  a few days ago Dr. Earney Mallet, [place] outpatient followup with him.    4. History of dyslipidemia continue home medications.    5. Questionable history of diabetes mellitus type 2-continue diet control check A1c. Patient not on any medications.    DVT Prophylaxis rash that SCDs   AM Labs Ordered, also please review Full Orders  Family Communication: Admission, patients condition and plan of care including tests being ordered have been discussed with the patient and water who indicate understanding and agree with the plan and Code Status.  Code Status full  Disposition Plan: To be decided  Time spent in minutes : 30  Condition fair  Leroy Sea M.D on 02/27/2012 at 9:18 PM  Between 7am to 7pm - Pager - [phone]  After 7pm go to [email or web address] - password [email or web address]  And look for the night coverage person covering me after hours  [facility] Office  [phone]

## 2012-02-27 NOTE — ED Notes (Signed)
Patient transported to CT 

## 2012-02-27 NOTE — ED Provider Notes (Signed)
History     CSN: [id number]  Arrival date & time 02/27/12  1805   First MD Initiated Contact with Patient 02/27/12 1808      Chief Complaint  Patient presents with  . Weakness  . Fall    (Consider location/radiation/quality/duration/timing/severity/associated sxs/prior treatment) HPI Comments: Patient presents via EMS with sudden onset of weakness with a fall and her laundry room. She landed on the floor for 1 hour before she was able to get up. She complains of nausea and profound extreme generalized weakness. Denies any chest pain, shortness of breath no abdominal pain. She denies any headache. She was feeling well prior to this event. She states she spitting recyclables into her laundry basket when she fell over in acute to get weak. She denies any vision change. She states she ate oatmeal today only. She denies history of diabetes. She has a history of a thoracic aortic aneurysm which has been stable..  The history is provided by the patient and the EMS personnel.    Past Medical History  Diagnosis Date  . Hyperlipidemia   . AAA (abdominal aortic aneurysm)     Past Surgical History  Procedure Date  . Hammer toe surgery 04/10/02    Left Toe  . Breast cyst aspiration 1965    Right Breast  . Nasal septum surgery 1980    Family History  Problem Relation Age of Onset  . Breast cancer    . Colon cancer    . Heart disease Mother   . Hyperlipidemia Sister   . Hyperlipidemia Brother   . Diabetes Sister   . Hyperlipidemia Sister   . Cancer Sister 23    colon  . Cancer Sister     breast  . Stroke Brother   . Hypertension Brother     History  Substance Use Topics  . Smoking status: Never Smoker   . Smokeless tobacco: Never Used  . Alcohol Use: No    OB History    Grav Para Term Preterm Abortions TAB SAB Ect Mult Living                  Review of Systems  Constitutional: Negative for activity change and appetite change.  HENT: Negative for congestion and  rhinorrhea.   Respiratory: Positive for chest tightness. Negative for cough and shortness of breath.   Cardiovascular: Negative for chest pain.  Gastrointestinal: Positive for nausea. Negative for vomiting and abdominal pain.  Genitourinary: Negative for dysuria and hematuria.  Musculoskeletal: Positive for myalgias and arthralgias. Negative for back pain.  Skin: Negative for rash.  Neurological: Positive for weakness. Negative for dizziness, light-headedness and headaches.    Allergies  Penicillins and Iohexol  Home Medications   No current outpatient prescriptions on file.  BP 155/78  Pulse 89  Temp 98.3 F (36.8 C) (Oral)  Resp 22  Ht 5\' 3"  (1.6 m)  Wt 163 lb 4.8 oz (74.072 kg)  BMI 28.93 kg/m2  SpO2 96%  LMP 07/19/1988  Physical Exam  Constitutional: She is oriented to person, place, and time. She appears well-developed and well-nourished. No distress.  HENT:  Head: Normocephalic and atraumatic.  Mouth/Throat: Oropharynx is clear and moist. No oropharyngeal exudate.       Abrasion right temple  Eyes: Conjunctivae and EOM are normal. Pupils are equal, round, and reactive to light.  Neck: Normal range of motion. Neck supple.       No C-spine pain, step-off or deformity  Cardiovascular: Normal rate, regular rhythm  and normal heart sounds.   No murmur heard. Pulmonary/Chest: Effort normal and breath sounds normal. No respiratory distress.  Abdominal: Soft. There is no tenderness. There is no rebound and no guarding.  Musculoskeletal: Normal range of motion. She exhibits no edema and no tenderness.  Neurological: She is alert and oriented to person, place, and time. No cranial nerve deficit.       Cranial nerves 3-12 intact, no ataxia on finger to nose, 5/5 strength throughout. Able to hold legs off the bed for 5 seconds.  Skin: Skin is warm.    ED Course  Procedures (including critical care time)  Labs Reviewed  CBC WITH DIFFERENTIAL - Abnormal; Notable for the  following:    WBC 12.3 (*)     HCT 35.8 (*)     Neutrophils Relative 85 (*)     Neutro Abs 10.4 (*)     Lymphocytes Relative 7 (*)     All other components within normal limits  COMPREHENSIVE METABOLIC PANEL - Abnormal; Notable for the following:    Sodium 133 (*)     Potassium 3.2 (*)     Glucose, Bld 162 (*)     GFR calc non Af Amer 82 (*)     All other components within normal limits  URINALYSIS, ROUTINE W REFLEX MICROSCOPIC - Abnormal; Notable for the following:    APPearance CLOUDY (*)     Hgb urine dipstick MODERATE (*)     Ketones, ur 40 (*)     Protein, ur 100 (*)     Nitrite POSITIVE (*)     Leukocytes, UA LARGE (*)     All other components within normal limits  URINE MICROSCOPIC-ADD ON - Abnormal; Notable for the following:    Bacteria, UA MANY (*)     Casts HYALINE CASTS (*)     All other components within normal limits  CARDIAC PANEL(CRET KIN+CKTOT+MB+TROPI) - Abnormal; Notable for the following:    Total CK 378 (*)     CK, MB 5.3 (*)     All other components within normal limits  PROTIME-INR  TROPONIN I  URINE CULTURE  TSH  HEMOGLOBIN A1C  CARDIAC PANEL(CRET KIN+CKTOT+MB+TROPI)  SODIUM, URINE, RANDOM  CREATININE, URINE, RANDOM  CARDIAC PANEL(CRET KIN+CKTOT+MB+TROPI)   Dg Chest 2 View  02/27/2012  *RADIOLOGY REPORT*  Clinical Data: Shortness of breath post fall  CHEST - 2 VIEW  Comparison: 10/17/2011 CT  Findings: Mildly tortuous thoracic aorta.  Heart size normal. Lungs are clear.  No effusion.  Minimal mid thoracic spondylitic changes. No pneumothorax.  IMPRESSION:  No acute disease  Original Report Authenticated By: Osa Craver, M.D.   Dg Hip Bilateral W/pelvis  02/27/2012  *RADIOLOGY REPORT*  Clinical Data: Fall.  Difficulty moving legs today.  BILATERAL HIP WITH PELVIS - 4+ VIEW  Comparison: None.  Findings: The obturator rings are intact.  Pubic symphysis degenerative disease.  Lumbar spondylosis partially visualized. Mild bilateral hip  osteoarthritis, slightly greater on the right than left.  There is no femoral neck fracture.  Both hips appear located.  The sacral arcades grossly intact. Subchondral sclerosis of the right femoral head appears degenerative associated with osteoarthritis.  IMPRESSION: No acute abnormality.  Original Report Authenticated By: Andreas Newport, M.D.   Ct Head Wo Contrast  02/27/2012  *RADIOLOGY REPORT*  Clinical Data:  Fall.  CT HEAD WITHOUT CONTRAST CT CERVICAL SPINE WITHOUT CONTRAST  Technique:  Multidetector CT imaging of the head and cervical spine was performed following the  standard protocol without intravenous contrast.  Multiplanar CT image reconstructions of the cervical spine were also generated.  Comparison:  10/17/2011  CT HEAD  Findings: Faint linear calcification noted in the globus pallidus nuclei.  Otherwise, The brain stem, cerebellum, cerebral peduncles, thalami, basal ganglia, basilar cisterns, and ventricular system appear unremarkable.  No intracranial hemorrhage, mass lesion, or acute infarction is identified.  Mild chronic ethmoid sinusitis noted.  IMPRESSION:  1.  Mild chronic ethmoid sinusitis.  No acute findings.  CT CERVICAL SPINE  Findings: Cervical spondylosis noted with fusion of the right facet joint at C2-3 and facet arthropathy on the left at C2-3, C3-4, and C4-5.  There is right facet arthropathy at C3-4.  This causes right osseous foraminal stenosis at the C3-4 level.  There is mild left osseous foraminal stenosis at C4-5 and C5-6.  No subluxation or cervical spine acute fracture is observed. Bilateral C7 cervical ribs are observed.  IMPRESSION:  1.  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 2.  Incidental bilateral C7 cervical ribs.  Original Report Authenticated By: Dellia Cloud, M.D.   Ct Cervical Spine Wo Contrast  02/27/2012  *RADIOLOGY REPORT*  Clinical Data:  Fall.  CT HEAD WITHOUT CONTRAST CT  CERVICAL SPINE WITHOUT CONTRAST  Technique:  Multidetector CT imaging of the head and cervical spine was performed following the standard protocol without intravenous contrast.  Multiplanar CT image reconstructions of the cervical spine were also generated.  Comparison:  10/17/2011  CT HEAD  Findings: Faint linear calcification noted in the globus pallidus nuclei.  Otherwise, The brain stem, cerebellum, cerebral peduncles, thalami, basal ganglia, basilar cisterns, and ventricular system appear unremarkable.  No intracranial hemorrhage, mass lesion, or acute infarction is identified.  Mild chronic ethmoid sinusitis noted.  IMPRESSION:  1.  Mild chronic ethmoid sinusitis.  No acute findings.  CT CERVICAL SPINE  Findings: Cervical spondylosis noted with fusion of the right facet joint at C2-3 and facet arthropathy on the left at C2-3, C3-4, and C4-5.  There is right facet arthropathy at C3-4.  This causes right osseous foraminal stenosis at the C3-4 level.  There is mild left osseous foraminal stenosis at C4-5 and C5-6.  No subluxation or cervical spine acute fracture is observed. Bilateral C7 cervical ribs are observed.  IMPRESSION:  1.  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 2.  Incidental bilateral C7 cervical ribs.  Original Report Authenticated By: Dellia Cloud, M.D.     1. Syncope   2. Urinary tract infection   3. Weakness       MDM  Generalized weakness and syncopal episode without prodrome. Sinus tachycardia with stable blood pressure. Patient is to poor by mouth intake today.   EKG nonischemic.  Troponin negative. Nonfocal neuro exam. +UTI  Generalized weakness and syncope likely from dehydration and UTI.  Will admit for hydration and cycling of cardiac markers.   Date: 02/27/2012  Rate: 103  Rhythm: sinus tachycardia  QRS Axis: normal  Intervals: normal  ST/T Wave abnormalities: nonspecific ST/T changes   Conduction Disutrbances:none  Narrative Interpretation:   Old EKG Reviewed: none available          Glynn Octave, MD 02/28/12 [phone]

## 2012-02-27 NOTE — ED Notes (Signed)
Pt was feeling okay and then sudden onset of weakness and fell in laundry room. Pt unable to get herself up for about 1 hour. CBG 170. BP 14680. HR 98. RR 16. Extreme nausea. 4 zofran IV. 22 left hand with 500 NS KVO.

## 2012-02-28 ENCOUNTER — Inpatient Hospital Stay (HOSPITAL_COMMUNITY): Payer: Medicare Other

## 2012-02-28 DIAGNOSIS — R5381 Other malaise: Secondary | ICD-10-CM

## 2012-02-28 LAB — BASIC METABOLIC PANEL
Chloride: 106 mEq/L (ref 96–112)
GFR calc Af Amer: >90 mL/min (ref >90)
GFR calc non Af Amer: >90 mL/min (ref >90)
Glucose, Bld: 95 mg/dL (ref 70–99)
Potassium: 3.3 mEq/L — ABNORMAL LOW (ref 3.5–5.1)
Sodium: 140 mEq/L (ref 135–145)

## 2012-02-28 LAB — TSH: TSH: 0.792 u[IU]/mL (ref 0.350–4.500)

## 2012-02-28 LAB — CARDIAC PANEL(CRET KIN+CKTOT+MB+TROPI)
Relative Index: 0.9 (ref 0.0–2.5)
Relative Index: 0.6 (ref 0.0–2.5)
Troponin I: <0.3 ng/mL (ref <0.30)

## 2012-02-28 LAB — CBC
Hemoglobin: 11.2 g/dL — ABNORMAL LOW (ref 12.0–15.0)
RBC: 3.66 MIL/uL — ABNORMAL LOW (ref 3.87–5.11)

## 2012-02-28 LAB — VITAMIN B12: Vitamin B-12: 661 pg/mL (ref 211–911)

## 2012-02-28 LAB — HEMOGLOBIN A1C: Hgb A1c MFr Bld: 5.4 % (ref <5.7)

## 2012-02-28 MED ORDER — LORAZEPAM 2 MG/ML IJ SOLN
1.0000 mg | Freq: Once | INTRAMUSCULAR | Status: AC
  Administered 2012-02-28: 1 mg via INTRAVENOUS

## 2012-02-28 MED ORDER — MAGNESIUM HYDROXIDE 400 MG/5ML PO SUSP: 30.0000 mL | Freq: Every evening | ORAL | Status: DC | PRN

## 2012-02-28 MED ORDER — ASPIRIN EC 81 MG PO TBEC
81.0000 mg | DELAYED_RELEASE_TABLET | Freq: Two times a day (BID) | ORAL | Status: DC
  Administered 2012-02-28 – 2012-03-01 (×4): 81 mg via ORAL
  Filled 2012-02-28 (×8): qty 1

## 2012-02-28 MED ORDER — POTASSIUM CHLORIDE CRYS ER 20 MEQ PO TBCR
40.0000 meq | EXTENDED_RELEASE_TABLET | Freq: Once | ORAL | Status: AC
  Administered 2012-02-28: 40 meq via ORAL
  Filled 2012-02-28: qty 2

## 2012-02-28 MED ORDER — VITAMIN C 500 MG PO TABS
500.0000 mg | ORAL_TABLET | Freq: Every day | ORAL | Status: DC
  Administered 2012-02-28: 500 mg via ORAL
  Filled 2012-02-28 (×2): qty 1

## 2012-02-28 MED ORDER — SODIUM CHLORIDE 0.9 % IJ SOLN
3.0000 mL | Freq: Two times a day (BID) | INTRAMUSCULAR | Status: DC
  Administered 2012-02-28 – 2012-03-01 (×3): 3 mL via INTRAVENOUS

## 2012-02-28 MED ORDER — LORAZEPAM 2 MG/ML IJ SOLN
INTRAMUSCULAR | Status: AC
  Filled 2012-02-28: qty 1

## 2012-02-28 MED ORDER — PROMETHAZINE HCL 25 MG/ML IJ SOLN
12.5000 mg | Freq: Four times a day (QID) | INTRAMUSCULAR | Status: DC | PRN
Start: 2012-02-28 — End: 2012-02-29
  Administered 2012-02-29: 12.5 mg via INTRAVENOUS
  Filled 2012-02-28 (×2): qty 1

## 2012-02-28 MED ORDER — DOCUSATE SODIUM 100 MG PO CAPS
100.0000 mg | ORAL_CAPSULE | Freq: Two times a day (BID) | ORAL | Status: DC
  Administered 2012-02-28 – 2012-03-01 (×4): 100 mg via ORAL
  Filled 2012-02-28 (×7): qty 1

## 2012-02-28 MED ORDER — ALBUTEROL SULFATE (5 MG/ML) 0.5% IN NEBU
2.5000 mg | INHALATION_SOLUTION | RESPIRATORY_TRACT | Status: DC | PRN
Start: 2012-02-28 — End: 2012-03-01

## 2012-02-28 MED ORDER — SIMVASTATIN 5 MG PO TABS
5.0000 mg | ORAL_TABLET | Freq: Every day | ORAL | Status: DC
  Filled 2012-02-28: qty 1

## 2012-02-28 MED ORDER — CALCIUM CARBONATE-VITAMIN D 500-200 MG-UNIT PO TABS
1.0000 | ORAL_TABLET | Freq: Every day | ORAL | Status: DC
  Administered 2012-02-28 – 2012-02-29 (×2): 1 via ORAL
  Filled 2012-02-28 (×3): qty 1

## 2012-02-28 MED ORDER — SODIUM CHLORIDE 0.9 % IV SOLN
INTRAVENOUS | Status: AC
  Administered 2012-02-28: 01:00:00 via INTRAVENOUS

## 2012-02-28 MED ORDER — HYDROCODONE-ACETAMINOPHEN 5-325 MG PO TABS
1.0000 | ORAL_TABLET | ORAL | Status: DC | PRN
Start: 2012-02-28 — End: 2012-02-29
  Administered 2012-02-28: 1 via ORAL
  Administered 2012-02-28 – 2012-02-29 (×2): 2 via ORAL
  Filled 2012-02-28: qty 2
  Filled 2012-02-28: qty 1
  Filled 2012-02-28: qty 2

## 2012-02-28 MED ORDER — BISACODYL 10 MG RE SUPP
10.0000 mg | Freq: Once | RECTAL | Status: DC
  Filled 2012-02-28: qty 1

## 2012-02-28 MED ORDER — ONDANSETRON HCL 4 MG/2ML IJ SOLN
INTRAMUSCULAR | Status: AC
  Filled 2012-02-28: qty 2

## 2012-02-28 MED ORDER — ONDANSETRON HCL 4 MG/2ML IJ SOLN
4.0000 mg | Freq: Four times a day (QID) | INTRAMUSCULAR | Status: DC | PRN
  Administered 2012-02-28 – 2012-02-29 (×3): 4 mg via INTRAVENOUS
  Filled 2012-02-28 (×3): qty 2

## 2012-02-28 MED ORDER — GUAIFENESIN-DM 100-10 MG/5ML PO SYRP: 5.0000 mL | ORAL_SOLUTION | ORAL | Status: DC | PRN

## 2012-02-28 MED ORDER — MINERAL OIL RE ENEM
1.0000 | ENEMA | Freq: Once | RECTAL | Status: AC
  Administered 2012-02-28: 1 via RECTAL
  Filled 2012-02-28: qty 1

## 2012-02-28 NOTE — Progress Notes (Signed)
Went in to check on patient and give her phenergan since she was still complaining of nausea after the dose of zofran. I had spoken with the PA on call and this was the plan. When assessing the patient she was breathing regularly and had a steady pulse but was not easily woken up. The daughter was very concerned that this was not normal compared to her baseline. I called the rapid response nurse who came to assess the patient. At that time she did wake up a little more and recognized her daughter. Will continue to monitor. Mayo Faulk, Melida Quitter

## 2012-02-28 NOTE — Evaluation (Signed)
Physical Therapy Evaluation Patient Details Name: Elizabeth Mccullough MRN: [id number] DOB: Dec 10, 1940 Today's Date: 02/28/2012 Time: 8295-6213 PT Time Calculation (min): 32 min  PT Assessment / Plan / Recommendation Clinical Impression  Pt is 71 y/o female admitted for s/p fall and UTI.  Pt reports being on floor for over one hour.  Pt will benefit from acute PT services to improve overall mobility and prepare for safe d/c home with family.    PT Assessment  Patient needs continued PT services    Follow Up Recommendations  Home health PT;Supervision - Intermittent    Barriers to Discharge        Equipment Recommendations  None recommended by PT    Recommendations for Other Services     Frequency Min 3X/week    Precautions / Restrictions Precautions Precautions: None   Pertinent Vitals/Pain C/o muscle soreness all over      Mobility  Bed Mobility Bed Mobility: Supine to Sit Supine to Sit: 4: Min assist;With rails Details for Bed Mobility Assistance: (A) to elevate shoulders OOB  Transfers Transfers: Sit to Stand;Stand to Sit Sit to Stand: 4: Min guard;From bed Stand to Sit: 4: Min guard;To chair/3-in-1 Details for Transfer Assistance: Minguard for safety with cues for hand placement.  Max cues for hand placement Ambulation/Gait Ambulation/Gait Assistance: 4: Min guard Ambulation Distance (Feet): 100 Feet Assistive device: Rolling walker Ambulation/Gait Assistance Details: Minguard for safety with cues for RW placement and body position within RW. Gait Pattern: Shuffle;Decreased trunk rotation    Exercises     PT Diagnosis: Difficulty walking;Generalized weakness;Acute pain  PT Problem List: Decreased strength;Decreased range of motion;Decreased activity tolerance;Decreased mobility;Decreased knowledge of use of DME;Pain PT Treatment Interventions: DME instruction;Gait training;Stair training;Functional mobility training;Therapeutic activities;Therapeutic  exercise;Balance training   PT Goals Acute Rehab PT Goals PT Goal Formulation: With patient Time For Goal Achievement: 03/06/12 Potential to Achieve Goals: Good Pt will go Supine/Side to Sit: with modified independence PT Goal: Supine/Side to Sit - Progress: Goal set today Pt will go Sit to Supine/Side: with modified independence PT Goal: Sit to Supine/Side - Progress: Goal set today Pt will go Sit to Stand: with modified independence PT Goal: Sit to Stand - Progress: Goal set today Pt will go Stand to Sit: with modified independence PT Goal: Stand to Sit - Progress: Goal set today Pt will Ambulate: >150 feet;with modified independence;with least restrictive assistive device PT Goal: Ambulate - Progress: Goal set today Pt will Go Up / Down Stairs: 3-5 stairs;with modified independence;with least restrictive assistive device PT Goal: Up/Down Stairs - Progress: Goal set today  Visit Information  Last PT Received On: 02/28/12 Assistance Needed: +1    Subjective Data  Subjective: "I'm so glad you made me get up.  I didn't think I could do this. Patient Stated Goal: To go home   Prior Functioning  Home Living Lives With: Alone Available Help at Discharge: Family Type of Home: House Home Access: Ramped entrance Home Layout: One level Bathroom Shower/Tub: Walk-in shower;Door Bathroom Toilet: Handicapped height Bathroom Accessibility: Yes How Accessible: Accessible via walker;Accessible via wheelchair Home Adaptive Equipment: Grab bars in shower;Bedside commode/3-in-1;Walker - rolling;Shower chair with back;Shower chair without back Prior Function Level of Independence: Independent Able to Take Stairs?: Yes Driving: Yes Vocation: Retired Musician: No difficulties Dominant Hand: Right    Cognition  Overall Cognitive Status: Appears within functional limits for tasks assessed/performed Arousal/Alertness: Awake/alert Orientation Level: Appears intact for  tasks assessed Behavior During Session: [facility] for tasks performed  Extremity/Trunk Assessment Right Upper Extremity Assessment RUE ROM/Strength/Tone: Unable to fully assess;Due to pain Left Upper Extremity Assessment LUE ROM/Strength/Tone: Unable to fully assess;Due to pain Right Lower Extremity Assessment RLE ROM/Strength/Tone: Unable to fully assess;Due to pain Left Lower Extremity Assessment LLE ROM/Strength/Tone: Unable to fully assess;Due to pain   Balance Static Standing Balance Static Standing - Balance Support: No upper extremity supported Static Standing - Level of Assistance: 5: Stand by assistance Static Standing - Comment/# of Minutes: ~2 minutes while performing ADLs at sink  End of Session PT - End of Session Equipment Utilized During Treatment: Gait belt Activity Tolerance: Patient tolerated treatment well Patient left: in chair;with call bell/phone within reach;with family/visitor present Nurse Communication: Mobility status  GP     Giovonnie Trettel 02/28/2012, 1:03 PM Jake Shark, PT DPT [phone]

## 2012-02-28 NOTE — Progress Notes (Signed)
  Echocardiogram 2D Echocardiogram has been performed.  [place]ian Co 02/28/2012, 5:23 PM

## 2012-02-28 NOTE — Progress Notes (Signed)
Subjective: Patient relates tremors for last 3 months, a little worse after fall.  She is complaining of generalized muscle pain. Pain left lower extremitiy, feels can not move a lot leg due to pain.   Objective: Filed Vitals:   02/28/12 0155 02/28/12 0156 02/28/12 0651 02/28/12 1300  BP: 162/90 159/86 149/62 125/58  Pulse: 90 104 87 81  Temp:   98 F (36.7 C) 98.3 F (36.8 C)  TempSrc:   Oral Oral  Resp: 22 22 20 18   Height:      Weight:   73.1 kg (161 lb 2.5 oz)   SpO2: 96% 96% 96% 99%   Weight change:    General: Alert, awake, oriented x3, in no acute distress.  HEENT: No bruits, no goiter.  Heart: Regular rate and rhythm, without murmurs, rubs, gallops.  Lungs: CTA, bilateral air movement.  Abdomen: Soft, nontender, nondistended, positive bowel sounds.  Neuro: Grossly intact, nonfocal. Motor strength 5-5, left leg pain with movement.  Extremities; no edema.   Lab Results:  [facility] 02/28/12 0530 02/27/12 1822  NA 140 133*  K 3.3* 3.2*  CL 106 97  CO2 25 26  GLUCOSE 95 162*  BUN 8 13  CREATININE 0.56 0.75  CALCIUM 8.3* 9.1  MG -- --  PHOS -- --    Basename 02/27/12 1822  AST 18  ALT 11  ALKPHOS 65  BILITOT 0.8  PROT 7.3  ALBUMIN 3.9   Basename 02/28/12 0530 02/27/12 1822  WBC 10.4 12.3*  NEUTROABS -- 10.4*  HGB 11.2* 12.2  HCT 33.9* 35.8*  MCV 92.6 92.0  PLT 229 219    Basename 02/28/12 1247 02/28/12 0530 02/27/12 2125  CKTOTAL 809* 557* 378*  CKMB 5.2* 5.1* 5.3*  CKMBINDEX -- -- --  TROPONINI <0.30 <0.30 <0.30   Basename 02/27/12 2125  HGBA1C 5.4    Basename 02/27/12 2125  TSH 0.792  T4TOTAL --  T3FREE --  THYROIDAB --    Micro Results: No results found for this or any previous visit (from the past 240 hour(s)).  Studies/Results: Dg Chest 2 View  02/27/2012  *RADIOLOGY REPORT*  Clinical Data: Shortness of breath post fall  CHEST - 2 VIEW  Comparison: 10/17/2011 CT  Findings: Mildly tortuous thoracic aorta.  Heart size normal.  Lungs are clear.  No effusion.  Minimal mid thoracic spondylitic changes. No pneumothorax.  IMPRESSION:  No acute disease  Original Report Authenticated By: Osa Craver, M.D.   Dg Hip Bilateral W/pelvis  02/27/2012  *RADIOLOGY REPORT*  Clinical Data: Fall.  Difficulty moving legs today.  BILATERAL HIP WITH PELVIS - 4+ VIEW  Comparison: None.  Findings: The obturator rings are intact.  Pubic symphysis degenerative disease.  Lumbar spondylosis partially visualized. Mild bilateral hip osteoarthritis, slightly greater on the right than left.  There is no femoral neck fracture.  Both hips appear located.  The sacral arcades grossly intact. Subchondral sclerosis of the right femoral head appears degenerative associated with osteoarthritis.  IMPRESSION: No acute abnormality.  Original Report Authenticated By: Andreas Newport, M.D.   Ct Head Wo Contrast  02/27/2012  *RADIOLOGY REPORT*  Clinical Data:  Fall.  CT HEAD WITHOUT CONTRAST CT CERVICAL SPINE WITHOUT CONTRAST  Technique:  Multidetector CT imaging of the head and cervical spine was performed following the standard protocol without intravenous contrast.  Multiplanar CT image reconstructions of the cervical spine were also generated.  Comparison:  10/17/2011  CT HEAD  Findings: Faint linear calcification noted in the globus pallidus nuclei.  Otherwise, The brain stem, cerebellum, cerebral peduncles, thalami, basal ganglia, basilar cisterns, and ventricular system appear unremarkable.  No intracranial hemorrhage, mass lesion, or acute infarction is identified.  Mild chronic ethmoid sinusitis noted.  IMPRESSION:  1.  Mild chronic ethmoid sinusitis.  No acute findings.  CT CERVICAL SPINE  Findings: Cervical spondylosis noted with fusion of the right facet joint at C2-3 and facet arthropathy on the left at C2-3, C3-4, and C4-5.  There is right facet arthropathy at C3-4.  This causes right osseous foraminal stenosis at the C3-4 level.  There is mild left  osseous foraminal stenosis at C4-5 and C5-6.  No subluxation or cervical spine acute fracture is observed. Bilateral C7 cervical ribs are observed.  IMPRESSION:  1.  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 2.  Incidental bilateral C7 cervical ribs.  Original Report Authenticated By: Dellia Cloud, M.D.   Ct Cervical Spine Wo Contrast  02/27/2012  *RADIOLOGY REPORT*  Clinical Data:  Fall.  CT HEAD WITHOUT CONTRAST CT CERVICAL SPINE WITHOUT CONTRAST  Technique:  Multidetector CT imaging of the head and cervical spine was performed following the standard protocol without intravenous contrast.  Multiplanar CT image reconstructions of the cervical spine were also generated.  Comparison:  10/17/2011  CT HEAD  Findings: Faint linear calcification noted in the globus pallidus nuclei.  Otherwise, The brain stem, cerebellum, cerebral peduncles, thalami, basal ganglia, basilar cisterns, and ventricular system appear unremarkable.  No intracranial hemorrhage, mass lesion, or acute infarction is identified.  Mild chronic ethmoid sinusitis noted.  IMPRESSION:  1.  Mild chronic ethmoid sinusitis.  No acute findings.  CT CERVICAL SPINE  Findings: Cervical spondylosis noted with fusion of the right facet joint at C2-3 and facet arthropathy on the left at C2-3, C3-4, and C4-5.  There is right facet arthropathy at C3-4.  This causes right osseous foraminal stenosis at the C3-4 level.  There is mild left osseous foraminal stenosis at C4-5 and C5-6.  No subluxation or cervical spine acute fracture is observed. Bilateral C7 cervical ribs are observed.  IMPRESSION:  1.  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 2.  Incidental bilateral C7 cervical ribs.  Original Report Authenticated By: Dellia Cloud, M.D.    Medications: I have reviewed the patient's current medications.  1. Near  syncope and fall with generalized weakness: Syncope work up in process, PT consulted.  TSH: 0.9. I will check B-12.   2. Dehydration with hyponatremia and hypokalemia- Improved with IV fluids. Will give 1 dose of KCL.   3. History of thoracic aneurysm-which is stable per patient she saw her cardiothoracic surgeon a few days ago Dr. Earney Mallet, [place] outpatient followup with him.  4. History of dyslipidemia: I will hold hold statin due to increase CK, and muscle pain.   5. Questionable history of diabetes mellitus type 2-continue diet control.  HB-A1: 5.4 . Patient not on any medications. 6-DVT Prophylaxis  SCDs  7-Tremors, per family some changes in gait. : Check MRI. 8-UTI: FU culture. Continue with Levaquin, day 2.  9-Generalzide pain, fall: CT head, neck negative for acute finding. Chest x ray negative, Hip x ray negative.  10-Depression: patient loss husband last September, she is feeling sad, depressed, grieving for her husband. She will need family support and need refer for therapy, counseling outpatient. Son in law at bedside aware of recommendation.      LOS:  1 day   Marisella Puccio M.D.  [facility] 02/28/2012, 2:51 PM

## 2012-02-28 NOTE — Progress Notes (Signed)
Called at 2056 by bedside RN reporting that patient was difficult to awake and disoriented. Vital signs stable, upon arrival patient arousable, oriented to person, place, time, situation, and family member in room. Will continue to monitor, advised bedside RN to call if needed

## 2012-02-29 ENCOUNTER — Inpatient Hospital Stay (HOSPITAL_COMMUNITY): Payer: Medicare Other

## 2012-02-29 DIAGNOSIS — K5900 Constipation, unspecified: Secondary | ICD-10-CM

## 2012-02-29 DIAGNOSIS — R112 Nausea with vomiting, unspecified: Secondary | ICD-10-CM

## 2012-02-29 DIAGNOSIS — R55 Syncope and collapse: Secondary | ICD-10-CM

## 2012-02-29 LAB — BASIC METABOLIC PANEL
Calcium: 9.2 mg/dL (ref 8.4–10.5)
GFR calc Af Amer: >90 mL/min (ref >90)
GFR calc non Af Amer: 86 mL/min — ABNORMAL LOW (ref >90)
Sodium: 141 mEq/L (ref 135–145)

## 2012-02-29 LAB — BLOOD GAS, ARTERIAL
Acid-Base Excess: 3.2 mmol/L — ABNORMAL HIGH (ref 0.0–2.0)
Bicarbonate: 27.5 mEq/L — ABNORMAL HIGH (ref 20.0–24.0)
O2 Saturation: 97.1 %
Patient temperature: 98.6
TCO2: 28.8 mmol/L (ref 0–100)

## 2012-02-29 LAB — CARDIAC PANEL(CRET KIN+CKTOT+MB+TROPI)
Relative Index: 0.7 (ref 0.0–2.5)
Total CK: 807 U/L — ABNORMAL HIGH (ref 7–177)

## 2012-02-29 LAB — LIPASE, BLOOD: Lipase: 18 U/L (ref 11–59)

## 2012-02-29 MED ORDER — LORAZEPAM 0.5 MG PO TABS
1.0000 mg | ORAL_TABLET | Freq: Once | ORAL | Status: AC
  Administered 2012-02-29: 1 mg via ORAL
  Filled 2012-02-29: qty 2

## 2012-02-29 MED ORDER — SODIUM CHLORIDE 0.9 % IV SOLN
INTRAVENOUS | Status: DC
  Administered 2012-02-29 – 2012-03-01 (×2): via INTRAVENOUS

## 2012-02-29 MED ORDER — PROMETHAZINE HCL 25 MG/ML IJ SOLN
6.2500 mg | Freq: Four times a day (QID) | INTRAMUSCULAR | Status: DC | PRN
  Administered 2012-02-29: 12.5 mg via INTRAVENOUS
  Filled 2012-02-29 (×2): qty 1

## 2012-02-29 MED ORDER — LEVOFLOXACIN IN D5W 500 MG/100ML IV SOLN
500.0000 mg | INTRAVENOUS | Status: DC
  Administered 2012-02-29: 500 mg via INTRAVENOUS
  Filled 2012-02-29 (×2): qty 100

## 2012-02-29 MED ORDER — PANTOPRAZOLE SODIUM 40 MG IV SOLR
40.0000 mg | Freq: Two times a day (BID) | INTRAVENOUS | Status: DC
  Administered 2012-02-29: 40 mg via INTRAVENOUS
  Filled 2012-02-29 (×2): qty 40

## 2012-02-29 MED ORDER — ACETAMINOPHEN 325 MG PO TABS: 650.0000 mg | ORAL_TABLET | ORAL | Status: DC | PRN

## 2012-02-29 MED ORDER — HYDROCODONE-ACETAMINOPHEN 5-325 MG PO TABS: 1.0000 | ORAL_TABLET | Freq: Four times a day (QID) | ORAL | Status: DC | PRN

## 2012-02-29 MED ORDER — PANTOPRAZOLE SODIUM 40 MG IV SOLR
40.0000 mg | INTRAVENOUS | Status: DC
  Filled 2012-02-29: qty 40
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
## 2012-02-29 NOTE — Progress Notes (Signed)
*  PRELIMINARY RESULTS* Vascular Ultrasound Carotid Duplex (Doppler) has been completed.  Preliminary findings: Bilaterally no significant ICA stenosis with antegrade vertebral flow.  Farrel Demark, RDMS, RVT  02/29/2012, 3:28 PM

## 2012-02-29 NOTE — Progress Notes (Signed)
Pt refusing orthostatic bp until IV inserted, that pt is anxious, and family at bedside.

## 2012-02-29 NOTE — Progress Notes (Addendum)
Subjective: Events from last night notice, I spoke with daughter (who is Char[employer] fundraiser ) , patient was very difficult to wake up at that time, per daughter patient had some sporadic movement extremities while sleeping. Patient never had dose of phenergan.  She got earlier dose opioids Patient has Right arm tremors for last 3 months.  Patient relates that generalized muscle pain is only with movement. She does not want Vicodin anymore.  Patient awake, following command, vomiting, think enema cause vomiting, denies abdominal pain,.  Objective: Filed Vitals:   02/28/12 0651 02/28/12 1300 02/28/12 2100 02/29/12 0522  BP: 149/62 125/58 148/65 143/67  Pulse: 87 81 79 79  Temp: 98 F (36.7 C) 98.3 F (36.8 C) 97.4 F (36.3 C) 98.3 F (36.8 C)  TempSrc: Oral Oral Oral Oral  Resp: 20 18 16 20   Height:      Weight: 73.1 kg (161 lb 2.5 oz)   73.3 kg (161 lb 9.6 oz)  SpO2: 96% 99% 98% 100%   Weight change: -0.773 kg (-1 lb 11.3 oz)   General: Alert, awake, oriented x3, in no acute distress.  HEENT: No bruits, no goiter.  Heart: Regular rate and rhythm, without murmurs, rubs, gallops.  Lungs: CTA, bilateral air movement.  Abdomen: Soft, nontender, nondistended, positive bowel sounds.  Neuro: limited motor strength due to pain. Patient was able to ambulate with PT today.  Extremities; no edema.    Lab Results:  [facility] 02/28/12 0530 02/27/12 1822  NA 140 133*  K 3.3* 3.2*  CL 106 97  CO2 25 26  GLUCOSE 95 162*  BUN 8 13  CREATININE 0.56 0.75  CALCIUM 8.3* 9.1  MG -- --  PHOS -- --    Basename 02/27/12 1822  AST 18  ALT 11  ALKPHOS 65  BILITOT 0.8  PROT 7.3  ALBUMIN 3.9    Basename 02/28/12 0530 02/27/12 1822  WBC 10.4 12.3*  NEUTROABS -- 10.4*  HGB 11.2* 12.2  HCT 33.9* 35.8*  MCV 92.6 92.0  PLT 229 219    Basename 02/28/12 1247 02/28/12 0530 02/27/12 2125  CKTOTAL 809* 557* 378*  CKMB 5.2* 5.1* 5.3*  CKMBINDEX -- -- --  TROPONINI <0.30 <0.30 <0.30    Basename  02/27/12 2125  HGBA1C 5.4    Basename 02/27/12 2125  TSH 0.792  T4TOTAL --  T3FREE --  THYROIDAB --    Basename 02/28/12 1230  VITAMINB12 661  FOLATE --  FERRITIN --  TIBC --  IRON --  RETICCTPCT --    Micro Results: No results found for this or any previous visit (from the past 240 hour(s)).  Studies/Results: Dg Chest 2 View  02/27/2012  *RADIOLOGY REPORT*  Clinical Data: Shortness of breath post fall  CHEST - 2 VIEW  Comparison: 10/17/2011 CT  Findings: Mildly tortuous thoracic aorta.  Heart size normal. Lungs are clear.  No effusion.  Minimal mid thoracic spondylitic changes. No pneumothorax.  IMPRESSION:  No acute disease  Original Report Authenticated By: Osa Craver, M.D.   Dg Hip Bilateral W/pelvis  02/27/2012  *RADIOLOGY REPORT*  Clinical Data: Fall.  Difficulty moving legs today.  BILATERAL HIP WITH PELVIS - 4+ VIEW  Comparison: None.  Findings: The obturator rings are intact.  Pubic symphysis degenerative disease.  Lumbar spondylosis partially visualized. Mild bilateral hip osteoarthritis, slightly greater on the right than left.  There is no femoral neck fracture.  Both hips appear located.  The sacral arcades grossly intact. Subchondral sclerosis of the right femoral  head appears degenerative associated with osteoarthritis.  IMPRESSION: No acute abnormality.  Original Report Authenticated By: Andreas Newport, M.D.   Dg Abd 1 View  02/28/2012  *RADIOLOGY REPORT*  Clinical Data: Nausea, constipation  ABDOMEN - 1 VIEW  Comparison: 11/01/2010  Findings: Normal bowel gas pattern.  No abnormal abdominal calcifications.  Degenerative changes in the thoracolumbar spine and at the symphysis pubis.  IMPRESSION:  1.  Normal bowel gas pattern.  Original Report Authenticated By: Osa Craver, M.D.   Ct Head Wo Contrast  02/27/2012  *RADIOLOGY REPORT*  Clinical Data:  Fall.  CT HEAD WITHOUT CONTRAST CT CERVICAL SPINE WITHOUT CONTRAST  Technique:  Multidetector CT  imaging of the head and cervical spine was performed following the standard protocol without intravenous contrast.  Multiplanar CT image reconstructions of the cervical spine were also generated.  Comparison:  10/17/2011  CT HEAD  Findings: Faint linear calcification noted in the globus pallidus nuclei.  Otherwise, The brain stem, cerebellum, cerebral peduncles, thalami, basal ganglia, basilar cisterns, and ventricular system appear unremarkable.  No intracranial hemorrhage, mass lesion, or acute infarction is identified.  Mild chronic ethmoid sinusitis noted.  IMPRESSION:  1.  Mild chronic ethmoid sinusitis.  No acute findings.  CT CERVICAL SPINE  Findings: Cervical spondylosis noted with fusion of the right facet joint at C2-3 and facet arthropathy on the left at C2-3, C3-4, and C4-5.  There is right facet arthropathy at C3-4.  This causes right osseous foraminal stenosis at the C3-4 level.  There is mild left osseous foraminal stenosis at C4-5 and C5-6.  No subluxation or cervical spine acute fracture is observed. Bilateral C7 cervical ribs are observed.  IMPRESSION:  1.  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 2.  Incidental bilateral C7 cervical ribs.  Original Report Authenticated By: Dellia Cloud, M.D.   Ct Cervical Spine Wo Contrast  02/27/2012  *RADIOLOGY REPORT*  Clinical Data:  Fall.  CT HEAD WITHOUT CONTRAST CT CERVICAL SPINE WITHOUT CONTRAST  Technique:  Multidetector CT imaging of the head and cervical spine was performed following the standard protocol without intravenous contrast.  Multiplanar CT image reconstructions of the cervical spine were also generated.  Comparison:  10/17/2011  CT HEAD  Findings: Faint linear calcification noted in the globus pallidus nuclei.  Otherwise, The brain stem, cerebellum, cerebral peduncles, thalami, basal ganglia, basilar cisterns, and ventricular system appear unremarkable.  No  intracranial hemorrhage, mass lesion, or acute infarction is identified.  Mild chronic ethmoid sinusitis noted.  IMPRESSION:  1.  Mild chronic ethmoid sinusitis.  No acute findings.  CT CERVICAL SPINE  Findings: Cervical spondylosis noted with fusion of the right facet joint at C2-3 and facet arthropathy on the left at C2-3, C3-4, and C4-5.  There is right facet arthropathy at C3-4.  This causes right osseous foraminal stenosis at the C3-4 level.  There is mild left osseous foraminal stenosis at C4-5 and C5-6.  No subluxation or cervical spine acute fracture is observed. Bilateral C7 cervical ribs are observed.  IMPRESSION:  1.  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 2.  Incidental bilateral C7 cervical ribs.  Original Report Authenticated By: Dellia Cloud, M.D.   Mr Brain Wo Contrast  02/28/2012  *RADIOLOGY REPORT*  Clinical Data: 71 year old female with weakness and tremors.  Gait problem.  MRI HEAD WITHOUT CONTRAST  Technique:  Multiplanar, multiecho pulse sequences of the brain  and surrounding structures were obtained according to standard protocol without intravenous contrast.  Comparison: Head CTs without contrast 02/27/2012 and earlier.  Findings: No restricted diffusion to suggest acute infarction.  No midline shift, mass effect, evidence of mass lesion, ventriculomegaly, extra-axial collection or acute intracranial hemorrhage.  Cervicomedullary junction and pituitary are within normal limits.  Major intracranial vascular flow voids are preserved.  Mild for age scattered small foci of T2 and FLAIR hyperintensity in the cerebral white matter.  Mild to moderate for age T2 heterogeneity in the deep gray matter nuclei, maximal the left globus pallidus.  Brainstem and visualized cervical spine are within normal limits for age.  Cerebellum is within normal limits.  Visualized orbit soft tissues are within normal limits.  Visualized  paranasal sinuses and mastoids are clear.  Normal bone marrow signal.  Negative scalp soft tissues.  IMPRESSION: No acute intracranial abnormality. Mild for age nonspecific white matter and deep gray matter signal changes.  Original Report Authenticated By: Harley Hallmark, M.D.    Medications: I have reviewed the patient's current medications.  1. Near syncope and fall with generalized weakness:  TSH: 0.9. I will check B-12. ECHO: no aortic stenosis, Nl EF. No arrhythmia on telemetry. MRI negative for acute stroke.  2. Dehydration with hyponatremia and hypokalemia- B-met pending this am. Continue with IV fluids setting vomiting.   3. History of thoracic aneurysm-which is stable per patient she saw her cardiothoracic surgeon a few days ago Dr. Earney Mallet, [place] outpatient followup with him. No chest pain.   4. History of dyslipidemia: I will hold hold statin due to increase CK, and muscle pain.  5. Questionable history of diabetes mellitus type 2-continue diet control. HB-A1: 5.4 . Patient not on any medications.  6-DVT Prophylaxis SCDs  7-Tremors, per family some changes in gait. :  MRI negative for acute stroke.  8-UTI: FU culture. Continue with Levaquin, day 3, change to IV due to vomiting.  9-Generalzide pain, fall: CT head, neck negative for acute finding. Chest x ray negative, Hip x ray negative.  10-Depression: patient loss husband last September, she is feeling sad, depressed, grieving for her husband. She will need family support and need refer for therapy, counseling outpatient. Son in law at bedside aware of recommendation.   11-Nausea, Vomiting, Constipation: I will make patient NPO, IV fluids, protonix, discontinue opioids. On admission patient had retain stool, hard stool remove by nurse on admission but probably incomplete. KUB yesterday negative for obstruction. Fleet enema was give without results. Patient is afraid of that enema is causing nausea. I will consult [facility] [place],  patient might need desimpaction.   12-AMS: patient had episode of AMS, difficult to wake up, This could be probably secondary to medications, opioids, ? Also in the differential Seizure. No arrhythmia on telemetry at that time. I will get EEG. Neurology consulted. Dr Sandria Manly doesn't come to hospital for consult. Cycle cardiac enzymes, repeat B-met.    LOS: 2 days   REGALADO,BELKYS M.D.  [facility] 02/29/2012, 11:45 AM

## 2012-02-29 NOTE — Progress Notes (Signed)
Pt back from EEG, IV team beeped and requested to start new IV

## 2012-02-29 NOTE — Progress Notes (Signed)
CRITICAL VALUE ALERT  Critical value received:  ckmb   Date of notification:  02/29/2012   Time of notification:  1435  Critical value read back:yes  Nurse who received alert:  jet  MD notified (1st page):  Regalado  Time of first page:  1437  MD notified (2nd page):  Time of second page:  Responding MD:  Regalodo  Time MD responded:  1450

## 2012-02-29 NOTE — Progress Notes (Signed)
Physical Therapy Treatment Patient Details Name: Elizabeth Mccullough MRN: [id number] DOB: 1941/07/13 Today's Date: 02/29/2012 Time: 1010-1028 PT Time Calculation (min): 18 min  PT Assessment / Plan / Recommendation Comments on Treatment Session  Session limited by pt c/o nausea & reports she is unable to continue.  Pt states RN had previously adminstered nausea medication.      Follow Up Recommendations  Home health PT;Supervision - Intermittent    Barriers to Discharge        Equipment Recommendations  None recommended by PT    Recommendations for Other Services    Frequency Min 3X/week   Plan Discharge plan remains appropriate    Precautions / Restrictions         Mobility  Bed Mobility Bed Mobility: Supine to Sit;Sit to Supine Supine to Sit: 5: Supervision;HOB elevated Sit to Supine: 5: Supervision;HOB elevated Details for Bed Mobility Assistance: HOB elevated per pt's request due to increased nausea with lying supine.  Encouragement to perform as independently as possible- pt requesting assistance before attempting herself.   Transfers Transfers: Sit to Stand;Stand to Sit Sit to Stand: 5: Supervision;With upper extremity assist;From bed;From chair/3-in-1 Stand to Sit: 5: Supervision;With armrests;To bed;To chair/3-in-1 Details for Transfer Assistance: cues for safe hand placement.  Ambulation/Gait Ambulation/Gait Assistance: 4: Min guard Ambulation Distance (Feet): 20 Feet Assistive device: Rolling walker Ambulation/Gait Assistance Details: distance limited due to pt c/o nausea & requesting to terminate session.  Pt with shuffle-like steps.   Gait Pattern: Trunk flexed;Shuffle Stairs: No Wheelchair Mobility Wheelchair Mobility: No      PT Goals Acute Rehab PT Goals Time For Goal Achievement: 03/06/12 Potential to Achieve Goals: Good PT Goal: Supine/Side to Sit - Progress: Progressing toward goal PT Goal: Sit to Supine/Side - Progress: Progressing toward  goal PT Goal: Sit to Stand - Progress: Progressing toward goal PT Goal: Stand to Sit - Progress: Progressing toward goal PT Goal: Ambulate - Progress: Not met  Visit Information  Last PT Received On: 02/29/12 Assistance Needed: +1    Subjective Data  Subjective: "I just can't do much today.  I'm so nauseaus"   Cognition  Overall Cognitive Status: Appears within functional limits for tasks assessed/performed Arousal/Alertness: Awake/alert Orientation Level: Appears intact for tasks assessed Behavior During Session: [facility] for tasks performed    Balance  Balance Balance Assessed: No  End of Session PT - End of Session Equipment Utilized During Treatment: Gait belt Activity Tolerance: Other (comment) (limited by nausea) Patient left: in chair     Verdell Face, [place] [phone] 02/29/2012

## 2012-02-29 NOTE — Progress Notes (Signed)
EEG completed.

## 2012-02-29 NOTE — Procedures (Signed)
History: 71 yo F with a history of fall with generalized weakness of unclear etiology as well as witnessed staring spell with subsequent confusion.   Sedation: None  Background: There is a well defined posterior dominant rhythm of 12 Hz that attenuates with eye opening. There is predominantly alpha range activity, then there is an anterior shift in the PDR and some non-focal delta activity associated with drowsiness. Wicket rhythm was noted.    Hyperventilation: Not performed Photic stimulation: Physiologic driving is present  EEG Diagnosis: Normal Awake and drowsy EEG  Clinical Interpretation: This was a normal EEG. There was no seizure or evidence of seizure predisposition recorded on this study.   Ritta Slot, MD [facility] [phone]

## 2012-02-29 NOTE — Progress Notes (Signed)
Pt c/o nausea, this am. Phenegran administered with little effect.  Zofran ordered.Pt changed to NPO d/t nausea and dehydration.

## 2012-02-29 NOTE — Consult Note (Signed)
[facility] Consult: 12:20 PM 02/29/2012   Referring Provider: Lauraine Rinne Primary Care Physician:  Loreen Freud, DO Primary Gastroenterologist:  Dr. Juanda Chance, last seen in 2007.   Reason for Consultation:  Fecal impaction  HPI: Elizabeth Mccullough is a 71 y.o. female.  Admitted with near syncope, weakness, dehydration, hyponatremia, hypokalemia. She had fallen at home and was too weak to get up, but she never had LOC.  CT scan and MR neg for CVA.  On rectal exam had fecal impaction, disimpacted by nurse resulting in small amount of hard stools Monday morning.  The abdominal x ray was non-obstructive.   No response to further Fleet enema, dulcolax, MOM, colace.   Given Vicodin last night and this AM for all over body aches.   Both times the med caused nausea and emesis of recently consumed food.  She had nausea Sunday night as well but generally does not suffer from this.  She takes combo med of colace and stimulant laxative, 2 pills, every night at home.  Uses Miralax prn about once per month.  No blood in stools.  No anorexia.  No weight loss/gain. Does not need to use PPI at home, does not get reflux sxs.      ROS Right arm tremors. Weakness No bruising or bleeding  Problems No transfusions in past. No new meds No rash, sores or itching.  Chronic recurrent UTIs.  Not currently on Abx prophylaxis but has been on this in past.  AMS during admission, possibly from opioids.  Widowed Sep 2012, still depressed about this.  Sister had colon cancer.   Past Medical History  Diagnosis Date  . Hyperlipidemia   . AAA (abdominal aortic aneurysm)     Past Surgical History  Procedure Date  . Hammer toe surgery 04/10/02    Left Toe  . Breast cyst aspiration 1965    Right Breast  . Nasal septum surgery 1980    Prior to Admission medications   Medication Sig Start Date End Date Taking? Authorizing Provider  aspirin EC 81 MG tablet Take 81 mg  by mouth 2 (two) times daily.   Yes Historical Provider, MD  calcium-vitamin D (OSCAL WITH D) 500-200 MG-UNIT per tablet Take 1 tablet by mouth daily.   Yes Historical Provider, MD  docusate sodium (COLACE) 100 MG capsule Take 200 mg by mouth at bedtime. Takes 1 capsule in the morning and 2 capsules at night.   Yes Historical Provider, MD  Glucosamine-Chondroitin (GLUCOSAMINE CHONDR COMPLEX PO) Take 2 capsules by mouth daily.   Yes Historical Provider, MD  ibuprofen (ADVIL,MOTRIN) 200 MG tablet Take 200 mg by mouth every 6 (six) hours as needed. For pain   Yes Historical Provider, MD  Multiple Vitamins-Calcium (ONE-A-DAY WOMENS PO) Take 1 tablet by mouth daily.     Yes Historical Provider, MD  pravastatin (PRAVACHOL) 40 MG tablet Take 40 mg by mouth at bedtime.   Yes Historical Provider, MD  vitamin C (ASCORBIC ACID) 500 MG tablet Take 500 mg by mouth daily.   Yes Historical Provider, MD    Scheduled Meds:    . aspirin EC  81 mg Oral BID  . docusate sodium  100 mg Oral BID  . levofloxacin (LEVAQUIN) IV  500 mg Intravenous Q24H  . LORazepam      . LORazepam  1 mg Intravenous Once  . mineral oil  1 enema Rectal Once  . ondansetron      . pantoprazole (PROTONIX) IV  40 mg Intravenous Q12H  .  sodium chloride  3 mL Intravenous Q12H   Infusions:    . sodium chloride 75 mL/hr at 02/28/12 0100  . sodium chloride     PRN Meds: albuterol, magnesium hydroxide, ondansetron (ZOFRAN) IV, DISCONTD: guaiFENesin-dextromethorphan, DISCONTD: HYDROcodone-acetaminophen, DISCONTD: HYDROcodone-acetaminophen, DISCONTD: promethazine   Allergies as of 02/27/2012 - Review Complete 02/27/2012  Allergen Reaction Noted  . Penicillins Hives 07/04/2006  . Iohexol Rash 07/13/2006    Family History  Problem Relation Age of Onset  . Breast cancer    . Colon cancer    . Heart disease Mother   . Hyperlipidemia Sister   . Hyperlipidemia Brother   . Diabetes Sister   . Hyperlipidemia Sister   . Cancer  Sister 59    colon  . Cancer Sister     breast  . Stroke Brother   . Hypertension Brother     History   Social History  . Marital Status: Widowed    Spouse Name: N/A    Number of Children: N/A  . Years of Education: N/A   Occupational History  . retired    Social History Main Topics  . Smoking status: Never Smoker   . Smokeless tobacco: Never Used  . Alcohol Use: No  . Drug Use: No  . Sexually Active: Not Currently -- Female partner(s)   Other Topics Concern  . Not on file   Social History Narrative  . Lives alone, does all housework.       PHYSICAL EXAM: Vital signs in last 24 hours: Temp:  [97.4 F (36.3 C)-98.3 F (36.8 C)] 98.3 F (36.8 C) (08/13 0522) Pulse Rate:  [79-81] 79  (08/13 0522) Resp:  [16-20] 20  (08/13 0522) BP: (125-148)/(58-67) 143/67 mmHg (08/13 0522) SpO2:  [98 %-100 %] 100 % (08/13 0522) Weight:  [161 lb 9.6 oz (73.3 kg)] 161 lb 9.6 oz (73.3 kg) (08/13 0522)  General: looks non-toxic.  Head:  No swelling or asymmetry  Eyes:  No icterus or pallor Ears:  Not HOH  Nose:  No discharge or congestion Mouth:  Pink, clear oral MM.  Good teeth. Neck:  No mass or JVD Lungs:  Clear B.  No labored breathing Heart: RRR.  No MRG Abdomen:  Soft, active BS, NT, ND.  No mass or HSM.  No bruits.   Rectal: small to medium sized hard brown BM in vault .  Did not FOB test.  no masses  Musc/Skeltl: no joint swellling  Extremities:  No pedal edema Heme:  No large bruises or purpura  Neurologic:  Tremor of left UE.  Moves all 4s.  Skin:  No rash or sores Tattoos:  none   Psych:  Flat affect, depressed, not agitated.   Intake/Output from previous day: 08/12 0701 - 08/13 0700 In: 360 [P.O.:360] Out: -  Intake/Output this shift: Total I/O In: 120 [P.O.:120] Out: -   LAB RESULTS:  Basename 02/28/12 0530 02/27/12 1822  WBC 10.4 12.3*  HGB 11.2* 12.2  HCT 33.9* 35.8*  PLT 229 219   BMET Lab Results  Component Value Date   NA 140 02/28/2012     NA 133* 02/27/2012   NA 141 12/09/2011   K 3.3* 02/28/2012   K 3.2* 02/27/2012   K 3.6 12/09/2011   CL 106 02/28/2012   CL 97 02/27/2012   CL 102 12/09/2011   CO2 25 02/28/2012   CO2 26 02/27/2012   CO2 29 12/09/2011   GLUCOSE 95 02/28/2012   GLUCOSE 162* 02/27/2012   GLUCOSE  88 12/09/2011   BUN 8 02/28/2012   BUN 13 02/27/2012   BUN 11 12/09/2011   CREATININE 0.56 02/28/2012   CREATININE 0.75 02/27/2012   CREATININE 0.7 12/09/2011   CALCIUM 8.3* 02/28/2012   CALCIUM 9.1 02/27/2012   CALCIUM 9.3 12/09/2011   LFT  Basename 02/27/12 1822  PROT 7.3  ALBUMIN 3.9  AST 18  ALT 11  ALKPHOS 65  BILITOT 0.8  BILIDIR --  IBILI --   PT/INR Lab Results  Component Value Date   INR 1.03 02/27/2012    RADIOLOGY STUDIES: Dg Chest 2 View 02/27/2012  *RADIOLOGY REPORT*  Clinical Data: Shortness of breath post fall  CHEST - 2 VIEW  Comparison: 10/17/2011 CT  Findings: Mildly tortuous thoracic aorta.  Heart size normal. Lungs are clear.  No effusion.  Minimal mid thoracic spondylitic changes. No pneumothorax.  IMPRESSION:  No acute disease  Original Report Authenticated By: Osa Craver, M.D.   Dg Abd 1 View 02/28/2012  *RADIOLOGY REPORT*  Clinical Data: Nausea, constipation  ABDOMEN - 1 VIEW  Comparison: 11/01/2010  Findings: Normal bowel gas pattern.  No abnormal abdominal calcifications.  Degenerative changes in the thoracolumbar spine and at the symphysis pubis.  IMPRESSION:  1.  Normal bowel gas pattern.  Original Report Authenticated By: Osa Craver, M.D.   Ct Head Wo Contrast 02/27/2012  *RADIOLOGY REPORT*  Clinical Data:  Fall.  CT HEAD WITHOUT CONTRAST CT CERVICAL SPINE WITHOUT CONTRAST   IMPRESSION:  1.  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 2.  Incidental bilateral C7 cervical ribs.  Original Report Authenticated By: Dellia Cloud, M.D.   Ct Cervical Spine Wo Contrast 02/27/2012  *RADIOLOGY REPORT*   Clinical Data:  Fall.  CT HEAD WITHOUT CONTRAST CT CERVICAL SPINE WITHOUT CONTRAST  .   IMPRESSION:  1.  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 2.  Incidental bilateral C7 cervical ribs.  Original Report Authenticated By: Dellia Cloud, M.D.   Mr Brain Wo Contrast 02/28/2012  *RADIOLOGY REPORT*  Clinical Data: 71 year old female with weakness and tremors.  Gait problem.  MRI HEAD WITHOUT CONTRAST  .   IMPRESSION: No acute intracranial abnormality. Mild for age nonspecific white matter and deep gray matter signal changes.  Original Report Authenticated By: Harley Hallmark, M.D.    ENDOSCOPIC STUDIES: Colonoscopy 06/2003 Left sided tics  EGD  06/2006 Normal study  2003 colonoscopy   Tics in left colon.  No polyps.   IMPRESSION: *  Constipation.  Acute on chronic. Generally well managed with meds at home.  Current flare Due to narcotics (which have also caused nausea), UTI, bed rest, electrolyte derangements *  UTI.  On Levaquin.  *  Hypokalemia.  Need to recheck level *  Hyponatremia, resolved   PLAN: *  Check Potassium and Mag level    Recheck potassium is 3.6 *  Give tap water enema.  *  Will d/c Vicodin, she can take Tylenol, which she prefers   LOS: 2 days   Jennye Moccasin  02/29/2012, 12:20 PM Pager: [phone]

## 2012-02-29 NOTE — Consult Note (Signed)
Patient seen, examined, and I agree with the above documentation, including the assessment and plan. Acute on chronic constipation with fecal impaction. Ongoing nausea requiring anti-medics. She is having some breakthrough nausea despite her Zofran doses, thus I will at back promethazine 6.25-12.5 mg every 4-6 hours when necessary nausea not relieved by Zofran. After discussion with the daughter her unresponsive episode last night was not associated with promethazine dosing. The daughter is adamant that this medication was not given last evening at all. Thus I feel it is safe to try this medication We'll further work on constipation, supportive care  Correct electrolytes

## 2012-03-01 DIAGNOSIS — M6281 Muscle weakness (generalized): Secondary | ICD-10-CM

## 2012-03-01 DIAGNOSIS — M6282 Rhabdomyolysis: Secondary | ICD-10-CM

## 2012-03-01 LAB — CBC
MCH: 31.2 pg (ref 26.0–34.0)
Platelets: 248 10*3/uL (ref 150–400)
RBC: 3.88 MIL/uL (ref 3.87–5.11)
WBC: 6.9 10*3/uL (ref 4.0–10.5)

## 2012-03-01 LAB — CARDIAC PANEL(CRET KIN+CKTOT+MB+TROPI)
CK, MB: 4.9 ng/mL — ABNORMAL HIGH (ref 0.3–4.0)
Relative Index: 0.7 (ref 0.0–2.5)
Troponin I: <0.3 ng/mL (ref <0.30)

## 2012-03-01 LAB — BASIC METABOLIC PANEL
CO2: 29 mEq/L (ref 19–32)
Calcium: 9.1 mg/dL (ref 8.4–10.5)
Potassium: 3.5 mEq/L (ref 3.5–5.1)
Sodium: 145 mEq/L (ref 135–145)

## 2012-03-01 LAB — URINE CULTURE

## 2012-03-01 MED ORDER — POLYETHYLENE GLYCOL 3350 17 G PO PACK: 17.0000 g | PACK | Freq: Every day | ORAL | Status: AC

## 2012-03-01 MED ORDER — BISACODYL 10 MG RE SUPP: 10.0000 mg | Freq: Once | RECTAL | Status: DC

## 2012-03-01 MED ORDER — MAGNESIUM HYDROXIDE 400 MG/5ML PO SUSP: 30.0000 mL | Freq: Every evening | ORAL | Status: AC | PRN

## 2012-03-01 MED ORDER — LEVOFLOXACIN 500 MG PO TABS
500.0000 mg | ORAL_TABLET | Freq: Every day | ORAL | Status: DC
  Administered 2012-03-01: 500 mg via ORAL
  Filled 2012-03-01: qty 1

## 2012-03-01 MED ORDER — BISACODYL 5 MG PO TBEC: 5.0000 mg | DELAYED_RELEASE_TABLET | Freq: Every day | ORAL | Status: AC | PRN

## 2012-03-01 MED ORDER — POLYETHYLENE GLYCOL 3350 17 G PO PACK
17.0000 g | PACK | Freq: Every day | ORAL | Status: DC
  Administered 2012-03-01: 17 g via ORAL
  Filled 2012-03-01: qty 1

## 2012-03-01 MED ORDER — FLEET ENEMA 7-19 GM/118ML RE ENEM
1.0000 | ENEMA | Freq: Once | RECTAL | Status: AC
  Administered 2012-03-01: 1 via RECTAL
  Filled 2012-03-01: qty 1

## 2012-03-01 MED ORDER — LEVOFLOXACIN 500 MG PO TABS: 500.0000 mg | ORAL_TABLET | Freq: Every day | ORAL | Status: AC

## 2012-03-01 NOTE — Progress Notes (Signed)
     Elizabeth Mccullough Daily Rounding Note 03/01/2012, 8:40 AM  SUBJECTIVE:       Nausea resolved.  Some hard stool last PM, passing flatus today.  Tolerated clears and crackers, ready to try advancing diet.  Walked in hall this AM.  OBJECTIVE:         Vital signs in last 24 hours:    Temp:  [97.7 F (36.5 C)-98.5 F (36.9 C)] 97.7 F (36.5 C) (08/14 0659) Pulse Rate:  [74-91] 79  (08/14 0704) Resp:  [18-20] 20  (08/14 0704) BP: (131-156)/(67-88) 135/74 mmHg (08/14 0704) SpO2:  [89 %-100 %] 97 % (08/14 0704) Weight:  [162 lb 4.8 oz (73.619 kg)] 162 lb 4.8 oz (73.619 kg) (08/14 0704) Last BM Date: 02/29/12 General: less anxoius   Looks well. Heart: RRR Chest: clear B.  No dyspnea. Abdomen: soft, NT, ND, active BS  Extremities: no pedal edema Neuro/Psych:  Pleasant, less depressed and less anxious.    Lab Results:  Basename 03/01/12 0335 02/28/12 0530 02/27/12 1822  WBC 6.9 10.4 12.3*  HGB 12.1 11.2* 12.2  HCT 36.0 33.9* 35.8*  PLT 248 229 219   BMET  Basename 03/01/12 0335 02/29/12 1111 02/28/12 0530  NA 145 141 140  K 3.5 3.6 3.3*  CL 107 104 106  CO2 29 30 25   GLUCOSE 95 116* 95  BUN 12 13 8   CREATININE 0.69 0.64 0.56  CALCIUM 9.1 9.2 8.3*  Mag              2.2  ASSESMENT: * Constipation. Acute on chronic. Generally well managed with meds at home. Current flare Due to narcotics (which have also caused nausea), UTI, bed rest, electrolyte derangements  * UTI. On Levaquin.  * Hypokalemia. Now normal.   * Hyponatremia, resolved *  Tremors, weakness, fall without syncope, AMS.  EEG normal, head imaging normal    PLAN: *  Advance to cardiac diet.  *  Add daily Miralax in addition to colace. *  Will sign off.  Mccullough follow up prn  Dr Juanda Chance   LOS: 3 days   Jennye Moccasin  03/01/2012, 8:40 AM Pager: [phone]

## 2012-03-01 NOTE — Progress Notes (Signed)
Fleets enema administered with result.  Pt tolerated well.  Medium amount of hard brown stool noted.

## 2012-03-01 NOTE — Progress Notes (Signed)
Pt d/c and ready to be transported to private vehicle via wheelchair.   Pt taken off cardiac monitor, stable, no distress.  Discharge instructions given to pt and daughter, RN.  Appt made for PCP and this information also given to pt.  Pt education discussed regarding UTI.  Case Manager, Selena Batten, spoke with pt and daughter regarding life alert and home aides.

## 2012-03-01 NOTE — Progress Notes (Signed)
Patient evaluated for long-term disease management services with [facility] Care Management Program as a benefit of her [employer]. Spoke with patient and daughter at bedside to explain [facility] Care Management services. She was appreciative of visit but made clear that she has been managing pretty well at home until recent episode. States she will likely go home with home health services, she lives alone, and has supportive daughter. Made aware that she  will receive a post discharge transition of care call upon discharge. Left [facility] Care Management information at bedside. She reports she will look at it when she goes home. Left voicemail for inpatient Case Manager about visit. [facility] Care Management services do not interfere or replace what is arranged by inpatient Case Management or Social Work.       Raiford Noble, MSN- Ed, RN,BSN [facility] Liaison [phone].

## 2012-03-01 NOTE — Progress Notes (Signed)
Patient seen at the bedside, was admitted after patient fell at home while leaning forward and lifting clothes from the laundry basket. She denies loss of consciousness, no tongue biting, no bladder or bowel incontinent. She was recently seen at the cardiology office for  Palpitation and hypertension. She got the prescription but never took her medication because she mentions she never needed one.  She denies headaches, no blurry vision, no nausea, no vomiting, no confusion or disorientation. She denies hallucinations, no delusions, no symptoms of RLS but upon questioning she does have symptoms of possible narcolepsy, no neck pain, she has significant neuropathy with proximal muscle weakness.   No tinnitus, no dizziness, but has light headedness while turning in bed.   EEG was negative for Seizures.  Family and the patient believes this was not a seizure and do not want to be on any AED or even AED for prophylaxis.         Objective: Current vital signs: BP 135/74  Pulse 79  Temp 97.7 F (36.5 C) (Oral)  Resp 20  Ht 5\' 3"  (1.6 m)  Wt 73.619 kg (162 lb 4.8 oz)  BMI 28.75 kg/m2  SpO2 97%  LMP 07/19/1988 Vital signs in last 24 hours: Temp:  [97.7 F (36.5 C)-98.5 F (36.9 C)] 97.7 F (36.5 C) (08/14 0659) Pulse Rate:  [74-91] 79  (08/14 0704) Resp:  [18-20] 20  (08/14 0704) BP: (131-156)/(67-88) 135/74 mmHg (08/14 0704) SpO2:  [89 %-100 %] 97 % (08/14 0704) Weight:  [73.619 kg (162 lb 4.8 oz)] 73.619 kg (162 lb 4.8 oz) (08/14 0704)  Intake/Output from previous day: 08/13 0701 - 08/14 0700 In: 760 [P.O.:360; I.V.:400] Out: 900 [Urine:900] Intake/Output this shift:   Nutritional status: Cardiac  Physical Exam: General: Awake, Alert and follows commands.  Able to provide me the whole history and  In great details about the incidence HEENT: EOMI, PEERLA, no nystagmus, no ptosis, no lid lag, no ptosis,  Neck: Supple, no JVD, no carotid bruit, no LAD Lungs: CTA Heart; RRR, no  murmurs, no rubs, no gallops Abdomen: bowel sounds present Skin; no rash Extremities; No edema   Neurologic Exam: Mental status: AAO, follows commands.  Memory: intact Thought processes: delayed  but appropriated No hallucinations No delusions Mood: Appropriate No suicidal ideation  Cranial Nerves: Grossly intact Speech: Clear without dysarthria or aphasia Motor:  Mild proximal muscle weakness ( LE >UE)   Does not improve  Or get worse  with repeatetion  No rigidity no spasticity Reflexes  2+ in UE and   3+ in patellar, Achilles: 0 Plantar: upgoing  Sensory: significant decrease in pin prick and temp in distal extremities LE>UE Vibration mildly compromised in the LE  Cerebellar:  intact    Lab Results: Results for orders placed during the hospital encounter of 02/27/12 (from the past 48 hour(s))  VITAMIN B12     Status: Normal   Collection Time   02/28/12 12:30 PM      Component Value Range Comment   Vitamin B-12 661  211 - 911 pg/mL   CARDIAC PANEL(CRET KIN+CKTOT+MB+TROPI)     Status: Abnormal   Collection Time   02/28/12 12:47 PM      Component Value Range Comment   Total CK 809 (*) 7 - 177 U/L    CK, MB 5.2 (*) 0.3 - 4.0 ng/mL    Troponin I <0.30  <0.30 ng/mL    Relative Index 0.6  0.0 - 2.5   BASIC METABOLIC PANEL  Status: Abnormal   Collection Time   02/29/12 11:11 AM      Component Value Range Comment   Sodium 141  135 - 145 mEq/L    Potassium 3.6  3.5 - 5.1 mEq/L    Chloride 104  96 - 112 mEq/L    CO2 30  19 - 32 mEq/L    Glucose, Bld 116 (*) 70 - 99 mg/dL    BUN 13  6 - 23 mg/dL    Creatinine, Ser 1.61  0.50 - 1.10 mg/dL    Calcium 9.2  8.4 - 09.6 mg/dL    GFR calc non Af Amer 86 (*) >90 mL/min    GFR calc Af Amer >90  >90 mL/min   LIPASE, BLOOD     Status: Normal   Collection Time   02/29/12 11:11 AM      Component Value Range Comment   Lipase 18  11 - 59 U/L   CARDIAC PANEL(CRET KIN+CKTOT+MB+TROPI)     Status: Abnormal   Collection Time    02/29/12 11:11 AM      Component Value Range Comment   Total CK 807 (*) 7 - 177 U/L    CK, MB 6.1 (*) 0.3 - 4.0 ng/mL    Troponin I <0.30  <0.30 ng/mL    Relative Index 0.8  0.0 - 2.5   MAGNESIUM     Status: Normal   Collection Time   02/29/12  4:21 PM      Component Value Range Comment   Magnesium 2.2  1.5 - 2.5 mg/dL   BLOOD GAS, ARTERIAL     Status: Abnormal   Collection Time   02/29/12  4:35 PM      Component Value Range Comment   pH, Arterial 7.411  7.350 - 7.450    pCO2 arterial 44.1  35.0 - 45.0 mmHg    pO2, Arterial 73.6 (*) 80.0 - 100.0 mmHg    Bicarbonate 27.5 (*) 20.0 - 24.0 mEq/L    TCO2 28.8  0 - 100 mmol/L    Acid-Base Excess 3.2 (*) 0.0 - 2.0 mmol/L    O2 Saturation 97.1      Patient temperature 98.6      Collection site RIGHT RADIAL      Drawn by [phone]      Sample type ARTERIAL DRAW      Allens test (pass/fail) PASS  PASS   CARDIAC PANEL(CRET KIN+CKTOT+MB+TROPI)     Status: Abnormal   Collection Time   02/29/12  7:33 PM      Component Value Range Comment   Total CK 891 (*) 7 - 177 U/L    CK, MB 6.0 (*) 0.3 - 4.0 ng/mL    Troponin I <0.30  <0.30 ng/mL    Relative Index 0.7  0.0 - 2.5   CARDIAC PANEL(CRET KIN+CKTOT+MB+TROPI)     Status: Abnormal   Collection Time   03/01/12  3:35 AM      Component Value Range Comment   Total CK 698 (*) 7 - 177 U/L    CK, MB 4.9 (*) 0.3 - 4.0 ng/mL    Troponin I <0.30  <0.30 ng/mL    Relative Index 0.7  0.0 - 2.5   CBC     Status: Normal   Collection Time   03/01/12  3:35 AM      Component Value Range Comment   WBC 6.9  4.0 - 10.5 K/uL    RBC 3.88  3.87 - 5.11 MIL/uL  Hemoglobin 12.1  12.0 - 15.0 g/dL    HCT 16.1  09.6 - 04.5 %    MCV 92.8  78.0 - 100.0 fL    MCH 31.2  26.0 - 34.0 pg    MCHC 33.6  30.0 - 36.0 g/dL    RDW 40.9  81.1 - 91.4 %    Platelets 248  150 - 400 K/uL   BASIC METABOLIC PANEL     Status: Abnormal   Collection Time   03/01/12  3:35 AM      Component Value Range Comment   Sodium 145  135 - 145  mEq/L    Potassium 3.5  3.5 - 5.1 mEq/L    Chloride 107  96 - 112 mEq/L    CO2 29  19 - 32 mEq/L    Glucose, Bld 95  70 - 99 mg/dL    BUN 12  6 - 23 mg/dL    Creatinine, Ser 7.82  0.50 - 1.10 mg/dL    Calcium 9.1  8.4 - 95.6 mg/dL    GFR calc non Af Amer 84 (*) >90 mL/min    GFR calc Af Amer >90  >90 mL/min     Recent Results (from the past 240 hour(s))  URINE CULTURE     Status: Normal   Collection Time   02/27/12  6:57 PM      Component Value Range Status Comment   Specimen Description URINE, RANDOM   Final    Special Requests OZ:HYQMV ON [id number] @2309    Final    Culture  Setup Time 02/28/2012 10:32   Final    Colony Count >=100,000 COLONIES/ML   Final    Culture ESCHERICHIA COLI   Final    Report Status 03/01/2012 FINAL   Final    Organism ID, Bacteria ESCHERICHIA COLI   Final     Lipid Panel No results found for this basename: CHOL,TRIG,HDL,CHOLHDL,VLDL,LDLCALC in the last 72 hours  Studies/Results: Dg Abd 1 View  02/28/2012  *RADIOLOGY REPORT*  Clinical Data: Nausea, constipation  ABDOMEN - 1 VIEW  Comparison: 11/01/2010  Findings: Normal bowel gas pattern.  No abnormal abdominal calcifications.  Degenerative changes in the thoracolumbar spine and at the symphysis pubis.  IMPRESSION:  1.  Normal bowel gas pattern.  Original Report Authenticated By: Osa Craver, M.D.   Mr Brain Wo Contrast  02/28/2012  *RADIOLOGY REPORT*  Clinical Data: 71 year old female with weakness and tremors.  Gait problem.  MRI HEAD WITHOUT CONTRAST  Technique:  Multiplanar, multiecho pulse sequences of the brain and surrounding structures were obtained according to standard protocol without intravenous contrast.  Comparison: Head CTs without contrast 02/27/2012 and earlier.  Findings: No restricted diffusion to suggest acute infarction.  No midline shift, mass effect, evidence of mass lesion, ventriculomegaly, extra-axial collection or acute intracranial hemorrhage.  Cervicomedullary junction and  pituitary are within normal limits.  Major intracranial vascular flow voids are preserved.  Mild for age scattered small foci of T2 and FLAIR hyperintensity in the cerebral white matter.  Mild to moderate for age T2 heterogeneity in the deep gray matter nuclei, maximal the left globus pallidus.  Brainstem and visualized cervical spine are within normal limits for age.  Cerebellum is within normal limits.  Visualized orbit soft tissues are within normal limits.  Visualized paranasal sinuses and mastoids are clear.  Normal bone marrow signal.  Negative scalp soft tissues.  IMPRESSION: No acute intracranial abnormality. Mild for age nonspecific white matter and deep gray matter signal  changes.  Original Report Authenticated By: Harley Hallmark, M.D.      Assessment and Plans:   Patient with fall without loc, no bladder or bowel incontinent, no tongue biting, no post ictal, no confusion or disorientation. She has chronic proximal muscle weakness and neuropathy and was unable to get up from fallen position.  She has thoracic aneurysm that is stable. Reviewed MRI of the brain, Carotid dopplers, and head CT no significant abnormalities.   This is less likely to be a seizure .  Most probably  Fall from basilar vertebral flow abnormalities, cardiogenic, neuropathy and or myopathy Recommendations: 1)  No AED 2) Needs to follow up for SPEP results ( pending) 3) Will need outpatient work up for proximal muscle weakness  Micaylah Bertucci V-P Eilleen Kempf., MD., Ph.D.,MS 03/01/2012 11:38 AM

## 2012-03-01 NOTE — Discharge Summary (Signed)
Physician Discharge Summary  Patient ID: Elizabeth Mccullough MRN: [id number] DOB/AGE: Aug 28, 1940 71 y.o.  Admit date: 02/27/2012 Discharge date: 03/01/2012  Primary Care Physician:  Loreen Freud, DO   Discharge Diagnoses:    Principal Problem:  *Near syncope Active Problems:  HYPERLIPIDEMIA  PORPHYRIA  GERD  UTI (lower urinary tract infection)  Leukocytosis  Thoracic aneurysm  Nausea & vomiting  Constipation  Muscle weakness  Rhabdomyolysis    Medication List  As of 03/01/2012  2:00 PM   STOP taking these medications         pravastatin 40 MG tablet         TAKE these medications         aspirin EC 81 MG tablet   Take 81 mg by mouth 2 (two) times daily.      bisacodyl 5 MG EC tablet   Commonly known as: DULCOLAX   Take 1 tablet (5 mg total) by mouth daily as needed for constipation.      calcium-vitamin D 500-200 MG-UNIT per tablet   Commonly known as: OSCAL WITH D   Take 1 tablet by mouth daily.      docusate sodium 100 MG capsule   Commonly known as: COLACE   Take 200 mg by mouth at bedtime. Takes 1 capsule in the morning and 2 capsules at night.      GLUCOSAMINE CHONDR COMPLEX PO   Take 2 capsules by mouth daily.      ibuprofen 200 MG tablet   Commonly known as: ADVIL,MOTRIN   Take 200 mg by mouth every 6 (six) hours as needed. For pain      levofloxacin 500 MG tablet   Commonly known as: LEVAQUIN   Take 1 tablet (500 mg total) by mouth daily.      magnesium hydroxide 400 MG/5ML suspension   Commonly known as: MILK OF MAGNESIA   Take 30 mLs by mouth at bedtime as needed.      ONE-A-DAY WOMENS PO   Take 1 tablet by mouth daily.      polyethylene glycol packet   Commonly known as: MIRALAX / GLYCOLAX   Take 17 g by mouth daily.      vitamin C 500 MG tablet   Commonly known as: ASCORBIC ACID   Take 500 mg by mouth daily.             Disposition and Follow-up:  Patient will be discharged home today in improved condition. She has been  instructed to followup with her primary care physician in about a week to 10 days. At that point she will need her total CK level to be followed. Pravastatin has been discontinued given her muscle weakness and elevated CPK levels.  Consults:  None    Significant Diagnostic Studies:  Dg Abd 1 View  02/28/2012  *RADIOLOGY REPORT*  Clinical Data: Nausea, constipation  ABDOMEN - 1 VIEW  Comparison: 11/01/2010  Findings: Normal bowel gas pattern.  No abnormal abdominal calcifications.  Degenerative changes in the thoracolumbar spine and at the symphysis pubis.  IMPRESSION:  1.  Normal bowel gas pattern.  Original Report Authenticated By: Osa Craver, M.D.   Mr Brain Wo Contrast  02/28/2012  *RADIOLOGY REPORT*  Clinical Data: 71 year old female with weakness and tremors.  Gait problem.  MRI HEAD WITHOUT CONTRAST  Technique:  Multiplanar, multiecho pulse sequences of the brain and surrounding structures were obtained according to standard protocol without intravenous contrast.  Comparison: Head CTs without contrast 02/27/2012 and  earlier.  Findings: No restricted diffusion to suggest acute infarction.  No midline shift, mass effect, evidence of mass lesion, ventriculomegaly, extra-axial collection or acute intracranial hemorrhage.  Cervicomedullary junction and pituitary are within normal limits.  Major intracranial vascular flow voids are preserved.  Mild for age scattered small foci of T2 and FLAIR hyperintensity in the cerebral white matter.  Mild to moderate for age T2 heterogeneity in the deep gray matter nuclei, maximal the left globus pallidus.  Brainstem and visualized cervical spine are within normal limits for age.  Cerebellum is within normal limits.  Visualized orbit soft tissues are within normal limits.  Visualized paranasal sinuses and mastoids are clear.  Normal bone marrow signal.  Negative scalp soft tissues.  IMPRESSION: No acute intracranial abnormality. Mild for age nonspecific  white matter and deep gray matter signal changes.  Original Report Authenticated By: Harley Hallmark, M.D.    Brief H and P: For complete details please refer to admission H and P, but in brief patient is a 71 year old female admitted on August 11 after a near syncopal event, after falling was unable to send out for a couple of hours, was found to have a urinary tract infection and we're asked to admit her for further evaluation and management.    Hospital Course:  Principal Problem:  *Near syncope Active Problems:  HYPERLIPIDEMIA  PORPHYRIA  GERD  UTI (lower urinary tract infection)  Leukocytosis  Thoracic aneurysm  Nausea & vomiting  Constipation  Muscle weakness  Rhabdomyolysis   #1 near syncopal event: I suspect related to urinary tract infection and decreased by mouth intake leading to mild volume depletion. Workup has been negative including CT, MRI, carotid Dopplers, no arrhythmias noted on heart monitor, no signs of acute coronary syndrome.  #2 Escherichia coli UTI: Sensitive to Levaquin. Continue for 5 more days post discharge.  #3 muscle weakness/rhabdomyolysis: After falling patient was trapped under a chair was unable to move for a couple of hours upon arrival she had a CPK level of 800. I suspect that this is the cause for her muscle weakness. I have also noted that she has been maintained on pravastatin for her cholesterol. I have elected to discontinue at this time. I would suggest her PCP recheck her CPK in about 4-6 weeks and then decide if rechallenged her with a statin is appropriate. She has been instructed to have copious by mouth fluid intake. She will be discharged with home health PT and OT services.  #4 constipation/fecal and action. She has had success with the Fleet enema today. Will be discharged with MiraLAX, Colace, bisacodyl.  #5 nausea/dry heaves: Suspect related to a combination of her UTI and constipation. Resolved.  Time spent on Discharge: Greater  than 30 minutes.  SignedChaya Jan [facility] Pager: [phone] 03/01/2012, 2:00 PM

## 2012-03-01 NOTE — Progress Notes (Signed)
Subjective: Feels much better. Nausea resolved. Had small bowel movement last night.  Objective: Vital signs in last 24 hours: Temp:  [97.7 F (36.5 C)-98.5 F (36.9 C)] 97.7 F (36.5 C) (08/14 0659) Pulse Rate:  [74-91] 79  (08/14 0704) Resp:  [18-20] 20  (08/14 0704) BP: (131-156)/(67-88) 135/74 mmHg (08/14 0704) SpO2:  [89 %-100 %] 97 % (08/14 0704) Weight:  [73.619 kg (162 lb 4.8 oz)] 73.619 kg (162 lb 4.8 oz) (08/14 0704) Weight change:  Last BM Date: 02/29/12  Intake/Output from previous day: 08/13 0701 - 08/14 0700 In: 760 [P.O.:360; I.V.:400] Out: 900 [Urine:900]     Physical Exam: General: Alert, awake, oriented x3, in no acute distress. HEENT: No bruits, no goiter. Heart: Regular rate and rhythm, without murmurs, rubs, gallops. Lungs: Clear to auscultation bilaterally. Abdomen: Soft, nontender, nondistended, positive bowel sounds. Extremities: No clubbing cyanosis or edema with positive pedal pulses. Neuro: Grossly intact, nonfocal.    Lab Results: Basic Metabolic Panel:  Basename 03/01/12 0335 02/29/12 1621 02/29/12 1111  NA 145 -- 141  K 3.5 -- 3.6  CL 107 -- 104  CO2 29 -- 30  GLUCOSE 95 -- 116*  BUN 12 -- 13  CREATININE 0.69 -- 0.64  CALCIUM 9.1 -- 9.2  MG -- 2.2 --  PHOS -- -- --   Liver Function Tests:  [facility] 02/27/12 1822  AST 18  ALT 11  ALKPHOS 65  BILITOT 0.8  PROT 7.3  ALBUMIN 3.9    Basename 02/29/12 1111  LIPASE 18  AMYLASE --   CBC:  Basename 03/01/12 0335 02/28/12 0530 02/27/12 1822  WBC 6.9 10.4 --  NEUTROABS -- -- 10.4*  HGB 12.1 11.2* --  HCT 36.0 33.9* --  MCV 92.8 92.6 --  PLT 248 229 --   Cardiac Enzymes:  Basename 03/01/12 0335 02/29/12 1933 02/29/12 1111  CKTOTAL 698* 891* 807*  CKMB 4.9* 6.0* 6.1*  CKMBINDEX -- -- --  TROPONINI <0.30 <0.30 <0.30   Hemoglobin A1C:  Basename 02/27/12 2125  HGBA1C 5.4   Thyroid Function Tests:  Basename 02/27/12 2125  TSH 0.792  T4TOTAL --  FREET4 --    T3FREE --  THYROIDAB --   Anemia Panel:  Basename 02/28/12 1230  VITAMINB12 661  FOLATE --  FERRITIN --  TIBC --  IRON --  RETICCTPCT --   Coagulation:  Basename 02/27/12 1822  LABPROT 13.7  INR 1.03   Urinalysis:  Basename 02/27/12 1857  COLORURINE YELLOW  LABSPEC 1.013  PHURINE 6.5  GLUCOSEU NEGATIVE  HGBUR MODERATE*  BILIRUBINUR NEGATIVE  KETONESUR 40*  PROTEINUR 100*  UROBILINOGEN 0.2  NITRITE POSITIVE*  LEUKOCYTESUR LARGE*    Recent Results (from the past 240 hour(s))  URINE CULTURE     Status: Normal   Collection Time   02/27/12  6:57 PM      Component Value Range Status Comment   Specimen Description URINE, RANDOM   Final    Special Requests WU:[id number] ON [id number] @2309    Final    Culture  Setup Time 02/28/2012 10:32   Final    Colony Count >=100,000 COLONIES/ML   Final    Culture ESCHERICHIA COLI   Final    Report Status 03/01/2012 FINAL   Final    Organism ID, Bacteria ESCHERICHIA COLI   Final     Studies/Results: Dg Abd 1 View  02/28/2012  *RADIOLOGY REPORT*  Clinical Data: Nausea, constipation  ABDOMEN - 1 VIEW  Comparison: 11/01/2010  Findings: Normal bowel gas pattern.  No abnormal abdominal calcifications.  Degenerative changes in the thoracolumbar spine and at the symphysis pubis.  IMPRESSION:  1.  Normal bowel gas pattern.  Original Report Authenticated By: Osa Craver, M.D.   Mr Brain Wo Contrast  02/28/2012  *RADIOLOGY REPORT*  Clinical Data: 71 year old female with weakness and tremors.  Gait problem.  MRI HEAD WITHOUT CONTRAST  Technique:  Multiplanar, multiecho pulse sequences of the brain and surrounding structures were obtained according to standard protocol without intravenous contrast.  Comparison: Head CTs without contrast 02/27/2012 and earlier.  Findings: No restricted diffusion to suggest acute infarction.  No midline shift, mass effect, evidence of mass lesion, ventriculomegaly, extra-axial collection or acute intracranial  hemorrhage.  Cervicomedullary junction and pituitary are within normal limits.  Major intracranial vascular flow voids are preserved.  Mild for age scattered small foci of T2 and FLAIR hyperintensity in the cerebral white matter.  Mild to moderate for age T2 heterogeneity in the deep gray matter nuclei, maximal the left globus pallidus.  Brainstem and visualized cervical spine are within normal limits for age.  Cerebellum is within normal limits.  Visualized orbit soft tissues are within normal limits.  Visualized paranasal sinuses and mastoids are clear.  Normal bone marrow signal.  Negative scalp soft tissues.  IMPRESSION: No acute intracranial abnormality. Mild for age nonspecific white matter and deep gray matter signal changes.  Original Report Authenticated By: Harley Hallmark, M.D.    Medications: Scheduled Meds:    . aspirin EC  81 mg Oral BID  . bisacodyl  10 mg Rectal Once  . docusate sodium  100 mg Oral BID  . levofloxacin (LEVAQUIN) IV  500 mg Intravenous Q24H  . LORazepam  1 mg Oral Once  . polyethylene glycol  17 g Oral Daily  . sodium chloride  3 mL Intravenous Q12H  . sodium phosphate  1 enema Rectal Once  . DISCONTD: pantoprazole (PROTONIX) IV  40 mg Intravenous Q12H  . DISCONTD: pantoprazole (PROTONIX) IV  40 mg Intravenous Q24H   Continuous Infusions:    . sodium chloride 100 mL/hr at 03/01/12 0307   PRN Meds:.acetaminophen, albuterol, magnesium hydroxide, ondansetron (ZOFRAN) IV, promethazine  Assessment/Plan:  Principal Problem:  *Near syncope Active Problems:  HYPERLIPIDEMIA  PORPHYRIA  GERD  UTI (lower urinary tract infection)  Leukocytosis  Thoracic aneurysm  Nausea & vomiting  Constipation  Muscle weakness  Rhabdomyolysis   #1 Escherichia coli UTI: Sensitive to Levaquin. Continue for 7 days total.  #2 near syncope: I suspect related to urinary tract infection and decreased by mouth intake leading to mild volume depletion. Workup has been negative  including CT, MRI, carotid Dopplers, no arrhythmias on heart monitor, no signs of acute coronary syndrome.  #3 muscle weakness/rhabdomyolysis: After falling patient was trapped under a chair and was unable to move for a couple hours. Upon arrival she had a CK total of 800 that has slowly improved to about 500. I suspect this is the cause for her muscle weakness. She has been evaluated by PT who is recommending home health PT which will be arranged prior to her discharge.  #4 constipation/fecal impaction: She has had some success with MiraLAX and manual disimpaction. We'll order a Fleet enema and some suppositories. Appreciate GI input.  #5 nausea/dry heaves: Suspect related to a combination of her UTI and constipation/impaction. Resolved. Next  #6 disposition: Patient would really like to have a bowel movement prior to leaving the hospital. She is at this point medically ready for  discharge. She will get a Fleet enema immediately and we will evaluate later in the afternoon to see if discharge is possible.   LOS: 3 days   [facility] Pager: [phone] 03/01/2012, 12:16 PM

## 2012-03-01 NOTE — Progress Notes (Signed)
Pt tolerated liquid diet well.  Gave pt graham cracker, if tolerates will consider advancing for lunch to low sodium.  Family at bedside.

## 2012-03-01 NOTE — Progress Notes (Signed)
Patient seen, examined, and I agree with the above documentation, including the assessment and plan. Improving today, had small BM yesterday.  Nausea has resolved. Added Miralax and will add bisacodyl PR to hopefully stimulate from below. Call with questions

## 2012-03-01 NOTE — Progress Notes (Signed)
UR COMPLETED  

## 2012-03-02 LAB — VITAMIN D 1,25 DIHYDROXY
Vitamin D 1, 25 (OH)2 Total: 71 pg/mL (ref 18–72)
Vitamin D2 1, 25 (OH)2: <8 pg/mL

## 2012-03-02 NOTE — Care Management Note (Signed)
    Page 1 of 2   03/02/2012     10:31:06 AM   CARE MANAGEMENT NOTE 03/02/2012  Patient:  Elizabeth Mccullough, Elizabeth Mccullough   Account Number:  [id number]  Date Initiated:  03/02/2012  Documentation initiated by:  Tera Mater  Subjective/Objective Assessment:   71yo female admitted S/P Fall.  Pt. lives alone, however has assistance from daughter, Tresa Endo.     Action/Plan:   Discharge planning   Anticipated DC Date:  03/01/2012   Anticipated DC Plan:  HOME W HOME HEALTH SERVICES      DC Planning Services  CM consult      [facility] Choice  HOME HEALTH   Choice offered to / List presented to:  C-1 Patient        HH arranged  HH-2 PT  HH-3 OT      [facility] agency  [facility]   Status of service:  Completed, signed off Medicare Important Message given?   (If response is "NO", the following Medicare IM given date fields will be blank) Date Medicare IM given:   Date Additional Medicare IM given:    Discharge Disposition:  HOME W HOME HEALTH SERVICES  Per UR Regulation:  Reviewed for med. necessity/level of care/duration of stay  If discussed at Long Length of Stay Meetings, dates discussed:    Comments:  03/01/12 1430 Tera Mater, RN, BSN NCM [phone] In to speak with pt. about [facility] services as well as giving pt. list of HH agenices.  Pt. stated that her spouse had used [facility] in past and she was satisfied with their services.  TC to Lupita Leash, with Genevieve Norlander to give referral for [facility] PT/OT.  Pt. to be discharged home today.  Pt. states that she has a rolling walker at home.

## 2012-03-03 LAB — IMMUNOFIXATION ELECTROPHORESIS
IgG (Immunoglobin G), Serum: 1020 mg/dL (ref 690–1700)
Total Protein ELP: 6.3 g/dL (ref 6.0–8.3)

## 2012-03-10 ENCOUNTER — Encounter: Payer: Self-pay | Admitting: Family Medicine

## 2012-03-10 ENCOUNTER — Ambulatory Visit (INDEPENDENT_AMBULATORY_CARE_PROVIDER_SITE_OTHER): Payer: Medicare Other | Admitting: Family Medicine

## 2012-03-10 VITALS — BP 130/82 | HR 76 | Temp 98.5°F | Wt 162.6 lb

## 2012-03-10 DIAGNOSIS — R0602 Shortness of breath: Secondary | ICD-10-CM

## 2012-03-10 DIAGNOSIS — M6282 Rhabdomyolysis: Secondary | ICD-10-CM

## 2012-03-10 DIAGNOSIS — R748 Abnormal levels of other serum enzymes: Secondary | ICD-10-CM

## 2012-03-10 DIAGNOSIS — N390 Urinary tract infection, site not specified: Secondary | ICD-10-CM

## 2012-03-10 DIAGNOSIS — R42 Dizziness and giddiness: Secondary | ICD-10-CM

## 2012-03-10 DIAGNOSIS — M6281 Muscle weakness (generalized): Secondary | ICD-10-CM

## 2012-03-10 LAB — CBC WITH DIFFERENTIAL/PLATELET
Basophils Absolute: 0 10*3/uL (ref 0.0–0.1)
HCT: 36 % (ref 36.0–46.0)
Hemoglobin: 12 g/dL (ref 12.0–15.0)
Lymphs Abs: 1.8 10*3/uL (ref 0.7–4.0)
MCV: 94.8 fl (ref 78.0–100.0)
Monocytes Absolute: 0.4 10*3/uL (ref 0.1–1.0)
Monocytes Relative: 6 % (ref 3.0–12.0)
Neutro Abs: 4 10*3/uL (ref 1.4–7.7)
Platelets: 298 10*3/uL (ref 150.0–400.0)
RDW: 13.3 % (ref 11.5–14.6)

## 2012-03-10 LAB — BASIC METABOLIC PANEL
BUN: 16 mg/dL (ref 6–23)
CO2: 30 mEq/L (ref 19–32)
Chloride: 103 mEq/L (ref 96–112)
GFR: 94.84 mL/min (ref >60.00)
Glucose, Bld: 80 mg/dL (ref 70–99)
Potassium: 3.7 mEq/L (ref 3.5–5.1)
Sodium: 140 mEq/L (ref 135–145)

## 2012-03-10 LAB — HEPATIC FUNCTION PANEL
ALT: 16 U/L (ref 0–35)
AST: 18 U/L (ref 0–37)
Albumin: 4 g/dL (ref 3.5–5.2)
Total Bilirubin: 0.6 mg/dL (ref 0.3–1.2)

## 2012-03-10 LAB — POCT URINALYSIS DIPSTICK
Bilirubin, UA: NEGATIVE
Blood, UA: NEGATIVE
Glucose, UA: NEGATIVE
Ketones, UA: NEGATIVE
Leukocytes, UA: NEGATIVE
Nitrite, UA: NEGATIVE
Protein, UA: NEGATIVE
Spec Grav, UA: <=1.005
Urobilinogen, UA: 0.2
pH, UA: 7

## 2012-03-10 NOTE — Patient Instructions (Addendum)

## 2012-03-10 NOTE — Progress Notes (Signed)
  Subjective:    Patient ID: Elizabeth Mccullough, female    DOB: [birth year], 71 y.o.   MRN: [id number]  HPI Pt here for hospital f/u -- UTI.  Ua today in normal and muscle aches are gone since being d/c from hospital and off pravachol.  Pt cpk was also elevated.  Pt now c/o dizziness and fatigue. No other complaints.   Review of Systems    as above Objective:   Physical Exam  Constitutional: She is oriented to person, place, and time. She appears well-developed and well-nourished.  HENT:  Head: Normocephalic and atraumatic.  Right Ear: External ear normal.  Left Ear: External ear normal.  Nose: Nose normal.  Mouth/Throat: Oropharynx is clear and moist. No oropharyngeal exudate.  Neck: Normal range of motion. Neck supple. No thyromegaly present.  Cardiovascular: Normal rate and regular rhythm.   Pulmonary/Chest: Effort normal and breath sounds normal. No respiratory distress. She has no wheezes. She has no rales. She exhibits no tenderness.  Musculoskeletal: She exhibits no edema and no tenderness.  Lymphadenopathy:    She has no cervical adenopathy.  Neurological: She is alert and oriented to person, place, and time.  Psychiatric: She has a normal mood and affect. Her behavior is normal. Judgment and thought content normal.          Assessment & Plan:

## 2012-03-11 NOTE — Assessment & Plan Note (Signed)
Resolved off pravastatin

## 2012-03-11 NOTE — Assessment & Plan Note (Signed)
resolved 

## 2012-03-11 NOTE — Assessment & Plan Note (Signed)
Recheck cpk

## 2012-04-04 ENCOUNTER — Encounter: Payer: Self-pay | Admitting: Family Medicine

## 2012-04-06 ENCOUNTER — Encounter: Payer: Self-pay | Admitting: Internal Medicine

## 2012-04-09 ENCOUNTER — Encounter: Payer: Self-pay | Admitting: Family Medicine

## 2012-05-10 ENCOUNTER — Ambulatory Visit (INDEPENDENT_AMBULATORY_CARE_PROVIDER_SITE_OTHER): Payer: Medicare Other

## 2012-05-10 DIAGNOSIS — Z23 Encounter for immunization: Secondary | ICD-10-CM

## 2012-08-14 ENCOUNTER — Other Ambulatory Visit: Payer: Self-pay | Admitting: *Deleted

## 2012-08-14 ENCOUNTER — Telehealth: Payer: Self-pay | Admitting: *Deleted

## 2012-08-14 DIAGNOSIS — E7800 Pure hypercholesterolemia, unspecified: Secondary | ICD-10-CM

## 2012-08-14 MED ORDER — LOVASTATIN 40 MG PO TABS: 40.0000 mg | ORAL_TABLET | Freq: Every day | ORAL | Status: DC

## 2012-08-14 MED ORDER — ATORVASTATIN CALCIUM 40 MG PO TABS: 40.0000 mg | ORAL_TABLET | Freq: Every day | ORAL | Status: DC

## 2012-08-14 NOTE — Telephone Encounter (Signed)
Detailed msg left advising new Rx sent to the pharmacy.     KP

## 2012-08-14 NOTE — Telephone Encounter (Signed)
Patient called triage line, very angry that we "still don't have it the way she wants it". Pt requested that lovastatin be called into [facility] pharmacy as it is on the discounted plan. Refill sent in per pts request.

## 2012-08-14 NOTE — Telephone Encounter (Signed)
Pt ft VM stating that the pravastatin is no longer on 4 list at [facility] and would like to be change to lipitor 40 mg which is still discount.Please advise

## 2012-08-14 NOTE — Telephone Encounter (Signed)
Ok to change to Lipitor 40 mg  #90  1 po qhs , 2 refills

## 2012-08-30 ENCOUNTER — Ambulatory Visit: Payer: Medicare Other | Admitting: Family Medicine

## 2012-09-01 ENCOUNTER — Ambulatory Visit: Payer: Medicare Other | Admitting: Family Medicine

## 2012-09-04 ENCOUNTER — Encounter: Payer: Self-pay | Admitting: Family Medicine

## 2012-09-04 ENCOUNTER — Ambulatory Visit (INDEPENDENT_AMBULATORY_CARE_PROVIDER_SITE_OTHER): Payer: Medicare Other | Admitting: Family Medicine

## 2012-09-04 VITALS — BP 120/76 | HR 83 | Temp 98.2°F | Wt 158.2 lb

## 2012-09-04 DIAGNOSIS — N390 Urinary tract infection, site not specified: Secondary | ICD-10-CM

## 2012-09-04 DIAGNOSIS — R296 Repeated falls: Secondary | ICD-10-CM | POA: Insufficient documentation

## 2012-09-04 DIAGNOSIS — Z9181 History of falling: Secondary | ICD-10-CM

## 2012-09-04 DIAGNOSIS — R196 Halitosis: Secondary | ICD-10-CM

## 2012-09-04 DIAGNOSIS — E785 Hyperlipidemia, unspecified: Secondary | ICD-10-CM

## 2012-09-04 DIAGNOSIS — R739 Hyperglycemia, unspecified: Secondary | ICD-10-CM

## 2012-09-04 DIAGNOSIS — R6889 Other general symptoms and signs: Secondary | ICD-10-CM

## 2012-09-04 DIAGNOSIS — R7309 Other abnormal glucose: Secondary | ICD-10-CM

## 2012-09-04 LAB — CBC WITH DIFFERENTIAL/PLATELET
Basophils Relative: 0.2 % (ref 0.0–3.0)
Eosinophils Relative: 2.3 % (ref 0.0–5.0)
Hemoglobin: 12.9 g/dL (ref 12.0–15.0)
Lymphocytes Relative: 35.1 % (ref 12.0–46.0)
Monocytes Relative: 7.2 % (ref 3.0–12.0)
Neutrophils Relative %: 55.2 % (ref 43.0–77.0)
RBC: 4.1 Mil/uL (ref 3.87–5.11)
WBC: 5.4 10*3/uL (ref 4.5–10.5)

## 2012-09-04 LAB — TSH: TSH: 0.76 u[IU]/mL (ref 0.35–5.50)

## 2012-09-04 LAB — POCT URINALYSIS DIPSTICK
Bilirubin, UA: NEGATIVE
Leukocytes, UA: NEGATIVE
Nitrite, UA: NEGATIVE
Protein, UA: NEGATIVE
pH, UA: 7

## 2012-09-04 LAB — HEPATIC FUNCTION PANEL
AST: 29 U/L (ref 0–37)
Alkaline Phosphatase: 60 U/L (ref 39–117)
Total Bilirubin: 0.8 mg/dL (ref 0.3–1.2)

## 2012-09-04 LAB — BASIC METABOLIC PANEL
Calcium: 9.2 mg/dL (ref 8.4–10.5)
Creatinine, Ser: 0.7 mg/dL (ref 0.4–1.2)
Sodium: 142 mEq/L (ref 135–145)

## 2012-09-04 LAB — VITAMIN B12: Vitamin B-12: >1500 pg/mL — ABNORMAL HIGH (ref 211–911)

## 2012-09-04 LAB — LIPID PANEL
Cholesterol: 175 mg/dL (ref 0–200)
HDL: 55.1 mg/dL (ref >39.00)
LDL Cholesterol: 103 mg/dL — ABNORMAL HIGH (ref 0–99)
VLDL: 16.6 mg/dL (ref 0.0–40.0)

## 2012-09-04 NOTE — Assessment & Plan Note (Signed)
Check labs 

## 2012-09-04 NOTE — Addendum Note (Signed)
Addended by: Arnette Norris on: 09/04/2012 03:41 PM   Modules accepted: Orders

## 2012-09-04 NOTE — Patient Instructions (Addendum)

## 2012-09-04 NOTE — Progress Notes (Signed)
  Subjective:    Patient ID: Elizabeth Mccullough, female    DOB: 1940/07/30, 72 y.o.   MRN: [id number]  HPI Pt here at the request of family members because of her inc falls.  Pt was hospitalized in August for 3 days and w/u neg.  Pt is c//o tremor, shuffling gait and bad breath noticed by many people.  There is a family hx of diabetes.  They are also concerned about parkinsons.   No other complaints.      Review of Systems As above    Objective:   Physical Exam BP 146/82  Pulse 83  Temp(Src) 98.2 F (36.8 C) (Oral)  Wt 158 lb 3.2 oz (71.759 kg)  BMI 28.03 kg/m2  SpO2 96%  LMP 07/19/1988 General appearance: alert, cooperative, appears stated age and no distress Neck: no adenopathy, supple, symmetrical, trachea midline and thyroid not enlarged, symmetric, no tenderness/mass/nodules Lungs: clear to auscultation bilaterally Heart: S1, S2 normal Extremities: extremities normal, atraumatic, no cyanosis or edema Neurologic: Alert and oriented X 3, normal strength and tone. Normal symmetric reflexes. Normal coordination and gait       Assessment & Plan:

## 2012-09-04 NOTE — Assessment & Plan Note (Signed)
Check labs Check MRI Pt refusing neuro and PT referal

## 2012-09-05 ENCOUNTER — Telehealth: Payer: Self-pay | Admitting: Family Medicine

## 2012-09-05 NOTE — Telephone Encounter (Signed)
Patient would like lab results mailed to her home address.

## 2012-09-06 NOTE — Telephone Encounter (Signed)
Letter Mail

## 2012-09-20 ENCOUNTER — Telehealth: Payer: Self-pay | Admitting: Family Medicine

## 2012-09-20 NOTE — Telephone Encounter (Signed)
In reference to Neurology referral entered on 09/04/12, patient was contacted by [facility] to schedule and she declined to schedule.  Per my call to patient, she states she feels being referred to neurology was a false alarm.  States she has had no more problems with falls.  Again, patient declines neurology referral.

## 2012-09-20 NOTE — Telephone Encounter (Signed)
FYI for MD to review     KP

## 2012-09-28 NOTE — Telephone Encounter (Signed)
im already aware

## 2012-12-12 ENCOUNTER — Encounter: Payer: Medicare Other | Admitting: Family Medicine

## 2012-12-13 ENCOUNTER — Encounter: Payer: Self-pay | Admitting: Family Medicine

## 2012-12-13 ENCOUNTER — Ambulatory Visit (INDEPENDENT_AMBULATORY_CARE_PROVIDER_SITE_OTHER): Payer: Medicare Other | Admitting: Family Medicine

## 2012-12-13 VITALS — BP 130/82 | HR 73 | Temp 98.7°F | Ht 61.0 in | Wt 160.2 lb

## 2012-12-13 DIAGNOSIS — M949 Disorder of cartilage, unspecified: Secondary | ICD-10-CM

## 2012-12-13 DIAGNOSIS — M899 Disorder of bone, unspecified: Secondary | ICD-10-CM

## 2012-12-13 DIAGNOSIS — I712 Thoracic aortic aneurysm, without rupture: Secondary | ICD-10-CM

## 2012-12-13 DIAGNOSIS — R739 Hyperglycemia, unspecified: Secondary | ICD-10-CM

## 2012-12-13 DIAGNOSIS — IMO0001 Reserved for inherently not codable concepts without codable children: Secondary | ICD-10-CM

## 2012-12-13 DIAGNOSIS — Z0000 Encounter for general adult medical examination without abnormal findings: Secondary | ICD-10-CM

## 2012-12-13 DIAGNOSIS — E785 Hyperlipidemia, unspecified: Secondary | ICD-10-CM

## 2012-12-13 DIAGNOSIS — R7309 Other abnormal glucose: Secondary | ICD-10-CM

## 2012-12-13 LAB — POCT URINALYSIS DIPSTICK
Ketones, UA: NEGATIVE
Protein, UA: NEGATIVE
Spec Grav, UA: <=1.005
Urobilinogen, UA: 0.2
pH, UA: 8

## 2012-12-13 LAB — CBC WITH DIFFERENTIAL/PLATELET
Basophils Absolute: 0 10*3/uL (ref 0.0–0.1)
Eosinophils Absolute: 0.1 10*3/uL (ref 0.0–0.7)
Hemoglobin: 12.8 g/dL (ref 12.0–15.0)
Lymphocytes Relative: 34 % (ref 12.0–46.0)
MCHC: 33.6 g/dL (ref 30.0–36.0)
Monocytes Relative: 6.6 % (ref 3.0–12.0)
Neutro Abs: 3.6 10*3/uL (ref 1.4–7.7)
Platelets: 272 10*3/uL (ref 150.0–400.0)
RDW: 13.2 % (ref 11.5–14.6)

## 2012-12-13 LAB — HEPATIC FUNCTION PANEL
Albumin: 4.3 g/dL (ref 3.5–5.2)
Alkaline Phosphatase: 56 U/L (ref 39–117)
Total Bilirubin: 0.8 mg/dL (ref 0.3–1.2)

## 2012-12-13 LAB — BASIC METABOLIC PANEL
BUN: 17 mg/dL (ref 6–23)
CO2: 31 mEq/L (ref 19–32)
Calcium: 9.6 mg/dL (ref 8.4–10.5)
Creatinine, Ser: 0.7 mg/dL (ref 0.4–1.2)
GFR: 89.84 mL/min (ref >60.00)
Glucose, Bld: 83 mg/dL (ref 70–99)
Sodium: 139 mEq/L (ref 135–145)

## 2012-12-13 LAB — LIPID PANEL
Cholesterol: 172 mg/dL (ref 0–200)
HDL: 61.6 mg/dL (ref >39.00)
LDL Cholesterol: 96 mg/dL (ref 0–99)
VLDL: 14.6 mg/dL (ref 0.0–40.0)

## 2012-12-13 NOTE — Assessment & Plan Note (Signed)
con't meds  Check labs 

## 2012-12-13 NOTE — Assessment & Plan Note (Signed)
Check labs 

## 2012-12-13 NOTE — Patient Instructions (Addendum)
Preventive Care for Adults, Female A healthy lifestyle and preventive care can promote health and wellness. Preventive health guidelines for women include the following key practices.  A routine yearly physical is a good way to check with your caregiver about your health and preventive screening. It is a chance to share any concerns and updates on your health, and to receive a thorough exam.  Visit your dentist for a routine exam and preventive care every 6 months. Brush your teeth twice a day and floss once a day. Good oral hygiene prevents tooth decay and gum disease.  The frequency of eye exams is based on your age, health, family medical history, use of contact lenses, and other factors. Follow your caregiver's recommendations for frequency of eye exams.  Eat a healthy diet. Foods like vegetables, fruits, whole grains, low-fat dairy products, and lean protein foods contain the nutrients you need without too many calories. Decrease your intake of foods high in solid fats, added sugars, and salt. Eat the right amount of calories for you.Get information about a proper diet from your caregiver, if necessary.  Regular physical exercise is one of the most important things you can do for your health. Most adults should get at least 150 minutes of moderate-intensity exercise (any activity that increases your heart rate and causes you to sweat) each week. In addition, most adults need muscle-strengthening exercises on 2 or more days a week.  Maintain a healthy weight. The body mass index (BMI) is a screening tool to identify possible weight problems. It provides an estimate of body fat based on height and weight. Your caregiver can help determine your BMI, and can help you achieve or maintain a healthy weight.For adults 20 years and older:  A BMI below 18.5 is considered underweight.  A BMI of 18.5 to 24.9 is normal.  A BMI of 25 to 29.9 is considered overweight.  A BMI of 30 and above is  considered obese.  Maintain normal blood lipids and cholesterol levels by exercising and minimizing your intake of saturated fat. Eat a balanced diet with plenty of fruit and vegetables. Blood tests for lipids and cholesterol should begin at age 20 and be repeated every 5 years. If your lipid or cholesterol levels are high, you are over 50, or you are at high risk for heart disease, you may need your cholesterol levels checked more frequently.Ongoing high lipid and cholesterol levels should be treated with medicines if diet and exercise are not effective.  If you smoke, find out from your caregiver how to quit. If you do not use tobacco, do not start.  If you are pregnant, do not drink alcohol. If you are breastfeeding, be very cautious about drinking alcohol. If you are not pregnant and choose to drink alcohol, do not exceed 1 drink per day. One drink is considered to be 12 ounces (355 mL) of beer, 5 ounces (148 mL) of wine, or 1.5 ounces (44 mL) of liquor.  Avoid use of street drugs. Do not share needles with anyone. Ask for help if you need support or instructions about stopping the use of drugs.  High blood pressure causes heart disease and increases the risk of stroke. Your blood pressure should be checked at least every 1 to 2 years. Ongoing high blood pressure should be treated with medicines if weight loss and exercise are not effective.  If you are 55 to 74 years old, ask your caregiver if you should take aspirin to prevent strokes.  Diabetes   screening involves taking a blood sample to check your fasting blood sugar level. This should be done once every 3 years, after age 45, if you are within normal weight and without risk factors for diabetes. Testing should be considered at a younger age or be carried out more frequently if you are overweight and have at least 1 risk factor for diabetes.  Breast cancer screening is essential preventive care for women. You should practice "breast  self-awareness." This means understanding the normal appearance and feel of your breasts and may include breast self-examination. Any changes detected, no matter how small, should be reported to a caregiver. Women in their 20s and 30s should have a clinical breast exam (CBE) by a caregiver as part of a regular health exam every 1 to 3 years. After age 40, women should have a CBE every year. Starting at age 40, women should consider having a mammography (breast X-ray test) every year. Women who have a family history of breast cancer should talk to their caregiver about genetic screening. Women at a high risk of breast cancer should talk to their caregivers about having magnetic resonance imaging (MRI) and a mammography every year.  The Pap test is a screening test for cervical cancer. A Pap test can show cell changes on the cervix that might become cervical cancer if left untreated. A Pap test is a procedure in which cells are obtained and examined from the lower end of the uterus (cervix).  Women should have a Pap test starting at age 21.  Between ages 21 and 29, Pap tests should be repeated every 2 years.  Beginning at age 30, you should have a Pap test every 3 years as long as the past 3 Pap tests have been normal.  Some women have medical problems that increase the chance of getting cervical cancer. Talk to your caregiver about these problems. It is especially important to talk to your caregiver if a new problem develops soon after your last Pap test. In these cases, your caregiver may recommend more frequent screening and Pap tests.  The above recommendations are the same for women who have or have not gotten the vaccine for human papillomavirus (HPV).  If you had a hysterectomy for a problem that was not cancer or a condition that could lead to cancer, then you no longer need Pap tests. Even if you no longer need a Pap test, a regular exam is a good idea to make sure no other problems are  starting.  If you are between ages 65 and 70, and you have had normal Pap tests going back 10 years, you no longer need Pap tests. Even if you no longer need a Pap test, a regular exam is a good idea to make sure no other problems are starting.  If you have had past treatment for cervical cancer or a condition that could lead to cancer, you need Pap tests and screening for cancer for at least 20 years after your treatment.  If Pap tests have been discontinued, risk factors (such as a new sexual partner) need to be reassessed to determine if screening should be resumed.  The HPV test is an additional test that may be used for cervical cancer screening. The HPV test looks for the virus that can cause the cell changes on the cervix. The cells collected during the Pap test can be tested for HPV. The HPV test could be used to screen women aged 30 years and older, and should   be used in women of any age who have unclear Pap test results. After the age of 30, women should have HPV testing at the same frequency as a Pap test.  Colorectal cancer can be detected and often prevented. Most routine colorectal cancer screening begins at the age of 50 and continues through age 75. However, your caregiver may recommend screening at an earlier age if you have risk factors for colon cancer. On a yearly basis, your caregiver may provide home test kits to check for hidden blood in the stool. Use of a small camera at the end of a tube, to directly examine the colon (sigmoidoscopy or colonoscopy), can detect the earliest forms of colorectal cancer. Talk to your caregiver about this at age 50, when routine screening begins. Direct examination of the colon should be repeated every 5 to 10 years through age 75, unless early forms of pre-cancerous polyps or small growths are found.  Hepatitis C blood testing is recommended for all people born from 1945 through 1965 and any individual with known risks for hepatitis C.  Practice  safe sex. Use condoms and avoid high-risk sexual practices to reduce the spread of sexually transmitted infections (STIs). STIs include gonorrhea, chlamydia, syphilis, trichomonas, herpes, HPV, and human immunodeficiency virus (HIV). Herpes, HIV, and HPV are viral illnesses that have no cure. They can result in disability, cancer, and death. Sexually active women aged 25 and younger should be checked for chlamydia. Older women with new or multiple partners should also be tested for chlamydia. Testing for other STIs is recommended if you are sexually active and at increased risk.  Osteoporosis is a disease in which the bones lose minerals and strength with aging. This can result in serious bone fractures. The risk of osteoporosis can be identified using a bone density scan. Women ages 65 and over and women at risk for fractures or osteoporosis should discuss screening with their caregivers. Ask your caregiver whether you should take a calcium supplement or vitamin D to reduce the rate of osteoporosis.  Menopause can be associated with physical symptoms and risks. Hormone replacement therapy is available to decrease symptoms and risks. You should talk to your caregiver about whether hormone replacement therapy is right for you.  Use sunscreen with sun protection factor (SPF) of 30 or more. Apply sunscreen liberally and repeatedly throughout the day. You should seek shade when your shadow is shorter than you. Protect yourself by wearing long sleeves, pants, a wide-brimmed hat, and sunglasses year round, whenever you are outdoors.  Once a month, do a whole body skin exam, using a mirror to look at the skin on your back. Notify your caregiver of new moles, moles that have irregular borders, moles that are larger than a pencil eraser, or moles that have changed in shape or color.  Stay current with required immunizations.  Influenza. You need a dose every fall (or winter). The composition of the flu vaccine  changes each year, so being vaccinated once is not enough.  Pneumococcal polysaccharide. You need 1 to 2 doses if you smoke cigarettes or if you have certain chronic medical conditions. You need 1 dose at age 65 (or older) if you have never been vaccinated.  Tetanus, diphtheria, pertussis (Tdap, Td). Get 1 dose of Tdap vaccine if you are younger than age 65, are over 65 and have contact with an infant, are a healthcare worker, are pregnant, or simply want to be protected from whooping cough. After that, you need a Td   booster dose every 10 years. Consult your caregiver if you have not had at least 3 tetanus and diphtheria-containing shots sometime in your life or have a deep or dirty wound.  HPV. You need this vaccine if you are a woman age 26 or younger. The vaccine is given in 3 doses over 6 months.  Measles, mumps, rubella (MMR). You need at least 1 dose of MMR if you were born in 1957 or later. You may also need a second dose.  Meningococcal. If you are age 19 to 21 and a first-year college student living in a residence hall, or have one of several medical conditions, you need to get vaccinated against meningococcal disease. You may also need additional booster doses.  Zoster (shingles). If you are age 60 or older, you should get this vaccine.  Varicella (chickenpox). If you have never had chickenpox or you were vaccinated but received only 1 dose, talk to your caregiver to find out if you need this vaccine.  Hepatitis A. You need this vaccine if you have a specific risk factor for hepatitis A virus infection or you simply wish to be protected from this disease. The vaccine is usually given as 2 doses, 6 to 18 months apart.  Hepatitis B. You need this vaccine if you have a specific risk factor for hepatitis B virus infection or you simply wish to be protected from this disease. The vaccine is given in 3 doses, usually over 6 months. Preventive Services / Frequency Ages 19 to 39  Blood  pressure check.** / Every 1 to 2 years.  Lipid and cholesterol check.** / Every 5 years beginning at age 20.  Clinical breast exam.** / Every 3 years for women in their 20s and 30s.  Pap test.** / Every 2 years from ages 21 through 29. Every 3 years starting at age 30 through age 65 or 70 with a history of 3 consecutive normal Pap tests.  HPV screening.** / Every 3 years from ages 30 through ages 65 to 70 with a history of 3 consecutive normal Pap tests.  Hepatitis C blood test.** / For any individual with known risks for hepatitis C.  Skin self-exam. / Monthly.  Influenza immunization.** / Every year.  Pneumococcal polysaccharide immunization.** / 1 to 2 doses if you smoke cigarettes or if you have certain chronic medical conditions.  Tetanus, diphtheria, pertussis (Tdap, Td) immunization. / A one-time dose of Tdap vaccine. After that, you need a Td booster dose every 10 years.  HPV immunization. / 3 doses over 6 months, if you are 26 and younger.  Measles, mumps, rubella (MMR) immunization. / You need at least 1 dose of MMR if you were born in 1957 or later. You may also need a second dose.  Meningococcal immunization. / 1 dose if you are age 19 to 21 and a first-year college student living in a residence hall, or have one of several medical conditions, you need to get vaccinated against meningococcal disease. You may also need additional booster doses.  Varicella immunization.** / Consult your caregiver.  Hepatitis A immunization.** / Consult your caregiver. 2 doses, 6 to 18 months apart.  Hepatitis B immunization.** / Consult your caregiver. 3 doses usually over 6 months. Ages 40 to 64  Blood pressure check.** / Every 1 to 2 years.  Lipid and cholesterol check.** / Every 5 years beginning at age 20.  Clinical breast exam.** / Every year after age 40.  Mammogram.** / Every year beginning at age 40   and continuing for as long as you are in good health. Consult with your  caregiver.  Pap test.** / Every 3 years starting at age 30 through age 65 or 70 with a history of 3 consecutive normal Pap tests.  HPV screening.** / Every 3 years from ages 30 through ages 65 to 70 with a history of 3 consecutive normal Pap tests.  Fecal occult blood test (FOBT) of stool. / Every year beginning at age 50 and continuing until age 75. You may not need to do this test if you get a colonoscopy every 10 years.  Flexible sigmoidoscopy or colonoscopy.** / Every 5 years for a flexible sigmoidoscopy or every 10 years for a colonoscopy beginning at age 50 and continuing until age 75.  Hepatitis C blood test.** / For all people born from 1945 through 1965 and any individual with known risks for hepatitis C.  Skin self-exam. / Monthly.  Influenza immunization.** / Every year.  Pneumococcal polysaccharide immunization.** / 1 to 2 doses if you smoke cigarettes or if you have certain chronic medical conditions.  Tetanus, diphtheria, pertussis (Tdap, Td) immunization.** / A one-time dose of Tdap vaccine. After that, you need a Td booster dose every 10 years.  Measles, mumps, rubella (MMR) immunization. / You need at least 1 dose of MMR if you were born in 1957 or later. You may also need a second dose.  Varicella immunization.** / Consult your caregiver.  Meningococcal immunization.** / Consult your caregiver.  Hepatitis A immunization.** / Consult your caregiver. 2 doses, 6 to 18 months apart.  Hepatitis B immunization.** / Consult your caregiver. 3 doses, usually over 6 months. Ages 65 and over  Blood pressure check.** / Every 1 to 2 years.  Lipid and cholesterol check.** / Every 5 years beginning at age 20.  Clinical breast exam.** / Every year after age 40.  Mammogram.** / Every year beginning at age 40 and continuing for as long as you are in good health. Consult with your caregiver.  Pap test.** / Every 3 years starting at age 30 through age 65 or 70 with a 3  consecutive normal Pap tests. Testing can be stopped between 65 and 70 with 3 consecutive normal Pap tests and no abnormal Pap or HPV tests in the past 10 years.  HPV screening.** / Every 3 years from ages 30 through ages 65 or 70 with a history of 3 consecutive normal Pap tests. Testing can be stopped between 65 and 70 with 3 consecutive normal Pap tests and no abnormal Pap or HPV tests in the past 10 years.  Fecal occult blood test (FOBT) of stool. / Every year beginning at age 50 and continuing until age 75. You may not need to do this test if you get a colonoscopy every 10 years.  Flexible sigmoidoscopy or colonoscopy.** / Every 5 years for a flexible sigmoidoscopy or every 10 years for a colonoscopy beginning at age 50 and continuing until age 75.  Hepatitis C blood test.** / For all people born from 1945 through 1965 and any individual with known risks for hepatitis C.  Osteoporosis screening.** / A one-time screening for women ages 65 and over and women at risk for fractures or osteoporosis.  Skin self-exam. / Monthly.  Influenza immunization.** / Every year.  Pneumococcal polysaccharide immunization.** / 1 dose at age 65 (or older) if you have never been vaccinated.  Tetanus, diphtheria, pertussis (Tdap, Td) immunization. / A one-time dose of Tdap vaccine if you are over   65 and have contact with an infant, are a healthcare worker, or simply want to be protected from whooping cough. After that, you need a Td booster dose every 10 years.  Varicella immunization.** / Consult your caregiver.  Meningococcal immunization.** / Consult your caregiver.  Hepatitis A immunization.** / Consult your caregiver. 2 doses, 6 to 18 months apart.  Hepatitis B immunization.** / Check with your caregiver. 3 doses, usually over 6 months. ** Family history and personal history of risk and conditions may change your caregiver's recommendations. Document Released: 08/31/2001 Document Revised: 09/27/2011  Document Reviewed: 11/30/2010 [employer] Patient Information 2014 [employer]  

## 2012-12-13 NOTE — Assessment & Plan Note (Signed)
Per vascular  

## 2012-12-13 NOTE — Assessment & Plan Note (Signed)
con't ca and vita D 
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
## 2012-12-18 ENCOUNTER — Encounter: Payer: Self-pay | Admitting: Family Medicine

## 2013-04-02 ENCOUNTER — Ambulatory Visit (INDEPENDENT_AMBULATORY_CARE_PROVIDER_SITE_OTHER): Payer: Medicare Other | Admitting: Family Medicine

## 2013-04-02 ENCOUNTER — Encounter: Payer: Self-pay | Admitting: Family Medicine

## 2013-04-02 VITALS — BP 158/82 | HR 90 | Temp 98.7°F | Wt 157.6 lb

## 2013-04-02 DIAGNOSIS — F411 Generalized anxiety disorder: Secondary | ICD-10-CM

## 2013-04-02 DIAGNOSIS — R251 Tremor, unspecified: Secondary | ICD-10-CM

## 2013-04-02 DIAGNOSIS — R55 Syncope and collapse: Secondary | ICD-10-CM

## 2013-04-02 DIAGNOSIS — R259 Unspecified abnormal involuntary movements: Secondary | ICD-10-CM

## 2013-04-02 DIAGNOSIS — F419 Anxiety disorder, unspecified: Secondary | ICD-10-CM | POA: Insufficient documentation

## 2013-04-02 DIAGNOSIS — N390 Urinary tract infection, site not specified: Secondary | ICD-10-CM

## 2013-04-02 LAB — CBC WITH DIFFERENTIAL/PLATELET
Basophils Absolute: 0 10*3/uL (ref 0.0–0.1)
Basophils Relative: 0 % (ref 0.0–3.0)
Eosinophils Absolute: 0.1 10*3/uL (ref 0.0–0.7)
HCT: 37.8 % (ref 36.0–46.0)
Hemoglobin: 12.8 g/dL (ref 12.0–15.0)
Lymphocytes Relative: 28.1 % (ref 12.0–46.0)
Lymphs Abs: 1.8 10*3/uL (ref 0.7–4.0)
MCHC: 34 g/dL (ref 30.0–36.0)
Neutro Abs: 4.2 10*3/uL (ref 1.4–7.7)
RBC: 4.05 Mil/uL (ref 3.87–5.11)
RDW: 13.1 % (ref 11.5–14.6)

## 2013-04-02 LAB — POCT URINALYSIS DIPSTICK
Bilirubin, UA: NEGATIVE
Ketones, UA: NEGATIVE
Protein, UA: NEGATIVE
pH, UA: 8

## 2013-04-02 LAB — HEPATIC FUNCTION PANEL
AST: 23 U/L (ref 0–37)
Albumin: 4.7 g/dL (ref 3.5–5.2)
Alkaline Phosphatase: 57 U/L (ref 39–117)
Total Protein: 7.8 g/dL (ref 6.0–8.3)

## 2013-04-02 LAB — BASIC METABOLIC PANEL
CO2: 31 mEq/L (ref 19–32)
Calcium: 9.7 mg/dL (ref 8.4–10.5)
Chloride: 103 mEq/L (ref 96–112)
Glucose, Bld: 89 mg/dL (ref 70–99)
Potassium: 3.8 mEq/L (ref 3.5–5.1)
Sodium: 141 mEq/L (ref 135–145)

## 2013-04-02 LAB — TSH: TSH: 1.24 u[IU]/mL (ref 0.35–5.50)

## 2013-04-02 MED ORDER — IBUPROFEN 200 MG PO TABS: 400.0000 mg | ORAL_TABLET | Freq: Four times a day (QID) | ORAL | Status: DC | PRN

## 2013-04-02 MED ORDER — ALPRAZOLAM 0.25 MG PO TABS: 0.2500 mg | ORAL_TABLET | Freq: Two times a day (BID) | ORAL | Status: DC | PRN

## 2013-04-02 MED ORDER — SERTRALINE HCL 50 MG PO TABS
50.0000 mg | ORAL_TABLET | Freq: Every day | ORAL | Status: DC
Start: 2013-04-02 — End: 2013-04-20

## 2013-04-02 NOTE — Assessment & Plan Note (Signed)
Refer to neuro Check labs   

## 2013-04-02 NOTE — Assessment & Plan Note (Signed)
zoloft 50 mg qd Xanax 0.25 mg 1 po tid prn  rto 1 month

## 2013-04-02 NOTE — Progress Notes (Signed)
  Subjective:    Patient ID: Elizabeth Mccullough, female    DOB: 03-05-1941, 72 y.o.   MRN: [id number]  HPI Pt here with her daughter c/o tremor in R arm/ hand and leg that is worsening.  None of the meds help but she noticed that her anxiety is worse and on days when it is bad the tremor is worse as well.    Pt is also requesting a [place] because she has had so many asymptomatic utis.    Review of Systems As above    Objective:   Physical Exam  BP 158/82  Pulse 90  Temp(Src) 98.7 F (37.1 C) (Oral)  Wt 157 lb 9.6 oz (71.487 kg)  BMI 29.79 kg/m2  SpO2 97%  LMP 07/19/1988 General appearance: alert, cooperative, appears stated age and no distress Neck: no adenopathy, supple, symmetrical, trachea midline and thyroid not enlarged, symmetric, no tenderness/mass/nodules Lungs: clear to auscultation bilaterally Heart: S1, S2 normal Extremities: extremities normal, atraumatic, no cyanosis or edema Neurologic: Alert and oriented X 3, normal strength and tone. Normal symmetric reflexes. Normal coordination and gait---+ tremor in R Upper ext at rest and with intention      Assessment & Plan:

## 2013-04-02 NOTE — Progress Notes (Deleted)
  Subjective:    Elizabeth Mccullough is a 72 y.o. female who complains of {uti sx:16137}. She has had symptoms for {1-10:13787} {time; units:18646}. Patient also complains of {symptoms; ZOX:09604}. Patient denies {symptoms VWU:98119}. Patient {does/does not:19097::"does not"} have a history of recurrent UTI. Patient {does/does not:19097::"does not"} have a history of pyelonephritis.   {Common ambulatory SmartLinks:19316}  Review of Systems {ros; complete:30496}    Objective:    {exam; complete:17964}  Laboratory:  Urine dipstick: trace for hemoglobin and 1+ for leukocyte esterase.   Micro exam: not done.    Assessment:    {diagnosis:16142::"Acute cystitis"}     Plan:    {JYNW:29562}

## 2013-04-02 NOTE — Patient Instructions (Addendum)
Tremor  Tremor is a rhythmic, involuntary muscular contraction characterized by oscillations (to-and-fro movements) of a part of the body. The most common of all involuntary movements, tremor can affect various body parts such as the hands, head, facial structures, vocal cords, trunk, and legs; most tremors, however, occur in the hands. Tremor often accompanies neurological disorders associated with aging. Although the disorder is not life-threatening, it can be responsible for functional disability and social embarrassment.  TREATMENT   There are many types of tremor and several ways in which tremor is classified. The most common classification is by behavioral context or position. There are five categories of tremor within this classification: resting, postural, kinetic, task-specific, and psychogenic. Resting or static tremor occurs when the muscle is at rest, for example when the hands are lying on the lap. This type of tremor is often seen in patients with Parkinson's disease. Postural tremor occurs when a patient attempts to maintain posture, such as holding the hands outstretched. Postural tremors include physiological tremor, essential tremor, tremor with basal ganglia disease (also seen in patients with Parkinson's disease), cerebellar postural tremor, tremor with peripheral neuropathy, post-traumatic tremor, and alcoholic tremor. Kinetic or intention (action) tremor occurs during purposeful movement, for example during finger-to-nose testing. Task-specific tremor appears when performing goal-oriented tasks such as handwriting, speaking, or standing. This group consists of primary writing tremor, vocal tremor, and orthostatic tremor. Psychogenic tremor occurs in both older and younger patients. The key feature of this tremor is that it dramatically lessens or disappears when the patient is distracted.  PROGNOSIS  There are some treatment options available for tremor; the appropriate treatment depends on  accurate diagnosis of the cause. Some tremors respond to treatment of the underlying condition, for example in some cases of psychogenic tremor treating the patient's underlying mental problem may cause the tremor to disappear. Also, patients with tremor due to Parkinson's disease may be treated with Levodopa drug therapy. Symptomatic drug therapy is available for several other tremors as well. For those cases of tremor in which there is no effective drug treatment, physical measures such as teaching the patient to brace the affected limb during the tremor are sometimes useful. Surgical intervention such as thalamotomy or deep brain stimulation may be useful in certain cases.  Document Released: 06/25/2002 Document Revised: 09/27/2011 Document Reviewed: 07/05/2005  [employer]® Patient Information ©2014 [employer].

## 2013-04-05 ENCOUNTER — Encounter (INDEPENDENT_AMBULATORY_CARE_PROVIDER_SITE_OTHER): Payer: Medicare Other

## 2013-04-05 DIAGNOSIS — I6529 Occlusion and stenosis of unspecified carotid artery: Secondary | ICD-10-CM

## 2013-04-05 DIAGNOSIS — R55 Syncope and collapse: Secondary | ICD-10-CM

## 2013-04-09 ENCOUNTER — Ambulatory Visit (HOSPITAL_COMMUNITY)
Admission: RE | Admit: 2013-04-09 | Discharge: 2013-04-09 | Disposition: A | Discharge status: Home / Self Care | Payer: Medicare Other | Source: Ambulatory Visit | Attending: Family Medicine | Admitting: Family Medicine

## 2013-04-09 DIAGNOSIS — R209 Unspecified disturbances of skin sensation: Secondary | ICD-10-CM | POA: Insufficient documentation

## 2013-04-09 DIAGNOSIS — R51 Headache: Secondary | ICD-10-CM | POA: Insufficient documentation

## 2013-04-09 DIAGNOSIS — F3289 Other specified depressive episodes: Secondary | ICD-10-CM | POA: Insufficient documentation

## 2013-04-09 DIAGNOSIS — E236 Other disorders of pituitary gland: Secondary | ICD-10-CM | POA: Insufficient documentation

## 2013-04-09 DIAGNOSIS — R251 Tremor, unspecified: Secondary | ICD-10-CM

## 2013-04-09 DIAGNOSIS — G319 Degenerative disease of nervous system, unspecified: Secondary | ICD-10-CM | POA: Insufficient documentation

## 2013-04-09 DIAGNOSIS — R55 Syncope and collapse: Secondary | ICD-10-CM | POA: Insufficient documentation

## 2013-04-09 DIAGNOSIS — R259 Unspecified abnormal involuntary movements: Secondary | ICD-10-CM | POA: Insufficient documentation

## 2013-04-09 DIAGNOSIS — R5381 Other malaise: Secondary | ICD-10-CM | POA: Insufficient documentation

## 2013-04-09 DIAGNOSIS — F329 Major depressive disorder, single episode, unspecified: Secondary | ICD-10-CM | POA: Insufficient documentation

## 2013-04-10 ENCOUNTER — Emergency Department (HOSPITAL_BASED_OUTPATIENT_CLINIC_OR_DEPARTMENT_OTHER): Payer: Medicare Other

## 2013-04-10 ENCOUNTER — Emergency Department (HOSPITAL_BASED_OUTPATIENT_CLINIC_OR_DEPARTMENT_OTHER)
Admission: EM | Admit: 2013-04-10 | Discharge: 2013-04-10 | Disposition: A | Discharge status: Home / Self Care | Payer: Medicare Other | Attending: Emergency Medicine | Admitting: Emergency Medicine

## 2013-04-10 DIAGNOSIS — Z79899 Other long term (current) drug therapy: Secondary | ICD-10-CM | POA: Insufficient documentation

## 2013-04-10 DIAGNOSIS — Z880 Allergy status to penicillin: Secondary | ICD-10-CM | POA: Insufficient documentation

## 2013-04-10 DIAGNOSIS — Z7982 Long term (current) use of aspirin: Secondary | ICD-10-CM | POA: Insufficient documentation

## 2013-04-10 DIAGNOSIS — W19XXXA Unspecified fall, initial encounter: Secondary | ICD-10-CM

## 2013-04-10 DIAGNOSIS — W1809XA Striking against other object with subsequent fall, initial encounter: Secondary | ICD-10-CM | POA: Insufficient documentation

## 2013-04-10 DIAGNOSIS — S61411A Laceration without foreign body of right hand, initial encounter: Secondary | ICD-10-CM

## 2013-04-10 DIAGNOSIS — S61409A Unspecified open wound of unspecified hand, initial encounter: Secondary | ICD-10-CM | POA: Insufficient documentation

## 2013-04-10 DIAGNOSIS — S0003XA Contusion of scalp, initial encounter: Secondary | ICD-10-CM | POA: Insufficient documentation

## 2013-04-10 DIAGNOSIS — Z23 Encounter for immunization: Secondary | ICD-10-CM | POA: Insufficient documentation

## 2013-04-10 DIAGNOSIS — Y9301 Activity, walking, marching and hiking: Secondary | ICD-10-CM | POA: Insufficient documentation

## 2013-04-10 DIAGNOSIS — T07XXXA Unspecified multiple injuries, initial encounter: Secondary | ICD-10-CM

## 2013-04-10 DIAGNOSIS — Y929 Unspecified place or not applicable: Secondary | ICD-10-CM | POA: Insufficient documentation

## 2013-04-10 DIAGNOSIS — Z8679 Personal history of other diseases of the circulatory system: Secondary | ICD-10-CM | POA: Insufficient documentation

## 2013-04-10 DIAGNOSIS — E785 Hyperlipidemia, unspecified: Secondary | ICD-10-CM | POA: Insufficient documentation

## 2013-04-10 DIAGNOSIS — S40019A Contusion of unspecified shoulder, initial encounter: Secondary | ICD-10-CM | POA: Insufficient documentation

## 2013-04-10 MED ORDER — HYDROCODONE-ACETAMINOPHEN 5-325 MG PO TABS
1.0000 | ORAL_TABLET | Freq: Once | ORAL | Status: AC
  Administered 2013-04-10: 1 via ORAL
  Filled 2013-04-10: qty 1

## 2013-04-10 MED ORDER — HYDROCODONE-ACETAMINOPHEN 5-325 MG PO TABS: 2.0000 | ORAL_TABLET | ORAL | Status: DC | PRN

## 2013-04-10 MED ORDER — TETANUS-DIPHTH-ACELL PERTUSSIS 5-2.5-18.5 LF-MCG/0.5 IM SUSP
0.5000 mL | Freq: Once | INTRAMUSCULAR | Status: AC
  Administered 2013-04-10: 0.5 mL via INTRAMUSCULAR
  Filled 2013-04-10: qty 0.5

## 2013-04-10 MED ORDER — ONDANSETRON 4 MG PO TBDP
4.0000 mg | ORAL_TABLET | Freq: Once | ORAL | Status: AC
  Administered 2013-04-10: 4 mg via ORAL
  Filled 2013-04-10: qty 1

## 2013-04-10 NOTE — ED Notes (Signed)
Patient transported to CT 

## 2013-04-10 NOTE — ED Notes (Signed)
Pt returned from radiology.

## 2013-04-10 NOTE — ED Provider Notes (Signed)
LACERATION REPAIR Performed by: Wynetta Emery Authorized by: Wynetta Emery Consent: Verbal consent obtained. Risks and benefits: risks, benefits and alternatives were discussed Consent given by: patient Patient identity confirmed: Wrist band  Prepped and Draped in normal sterile fashion  Tetanus: UTD  Laceration Location: Home of right hand distal ulnar side  Laceration Length: 3cm  Anesthesia: local  Local anesthetic: 2% without epinephrine  Anesthetic total: 5 ml  Irrigation method: syringe  Amount of cleaning: copious   Wound explored to depth in good light on a bloodless field with no foreign bodies seen or palpated.   Skin closure: 4-0 ethylon  Number of sutures: 5  Technique: simple interupted  Patient tolerance: Patient tolerated the procedure well with no immediate complications.  Antibx ointment applied. Instructions for care discussed verbally and patient provided with additional written instructions for homecare and f/u.   Wynetta Emery, PA-C 04/10/13 1333

## 2013-04-10 NOTE — ED Provider Notes (Signed)
CSN: [id number]     Arrival date & time 04/10/13  1047 History   First MD Initiated Contact with Patient 04/10/13 1128     Chief Complaint  Patient presents with  . Fall  . Head Injury   (Consider location/radiation/quality/duration/timing/severity/associated sxs/prior Treatment) HPI Comments: Patient states she had a mechanical fall while walking outside. Her sandal got caught on the sidewalk and she fell onto her right hand, shoulder and head. Denies losing consciousness. She has ecchymosis to her right eyebrow, right shoulder and laceration to her right hand. Denies dizziness, lightheadedness, chest pain or shortness of breath. No abdominal pain. She takes aspirin but no other blood thinners. No focal weakness, numbness or tingling. Denies any syncope.  The history is provided by the patient.    Past Medical History  Diagnosis Date  . Hyperlipidemia   . AAA (abdominal aortic aneurysm)    Past Surgical History  Procedure Laterality Date  . Hammer toe surgery  04/10/02    Left Toe  . Breast cyst aspiration  1965    Right Breast  . Nasal septum surgery  1980   Family History  Problem Relation Age of Onset  . Breast cancer    . Colon cancer    . Heart disease Mother   . Hyperlipidemia Sister   . Hyperlipidemia Brother   . Diabetes Sister   . Hyperlipidemia Sister   . Cancer Sister 75    colon  . Cancer Sister     breast  . Stroke Brother   . Hypertension Brother    History  Substance Use Topics  . Smoking status: Never Smoker   . Smokeless tobacco: Never Used  . Alcohol Use: No   OB History   Grav Para Term Preterm Abortions TAB SAB Ect Mult Living                 Review of Systems  Constitutional: Negative for fever, activity change and appetite change.  HENT: Negative for congestion and rhinorrhea.   Respiratory: Negative for cough, chest tightness and shortness of breath.   Cardiovascular: Negative for chest pain.  Gastrointestinal: Negative for nausea,  vomiting and abdominal pain.  Genitourinary: Negative for dysuria and hematuria.  Musculoskeletal: Positive for myalgias and arthralgias.  Skin: Positive for wound.  Neurological: Negative for dizziness, weakness and headaches.  A complete 10 system review of systems was obtained and all systems are negative except as noted in the HPI and PMH.    Allergies  Penicillins and Iohexol  Home Medications   Current Outpatient Rx  Name  Route  Sig  Dispense  Refill  . acetaminophen (TYLENOL) 325 MG tablet   Oral   Take 650 mg by mouth every 6 (six) hours as needed for pain.         Marland Kitchen ALPRAZolam (XANAX) 0.25 MG tablet   Oral   Take 1 tablet (0.25 mg total) by mouth 2 (two) times daily as needed for sleep.   30 tablet   0   . aspirin 325 MG EC tablet   Oral   Take 325 mg by mouth 2 (two) times daily.         Marland Kitchen B-Complex-C TABS   Oral   Take 1 tablet by mouth daily.         . Calcium 1200-1000 MG-UNIT CHEW   Oral   Chew 1 tablet by mouth daily.         Marland Kitchen HYDROcodone-acetaminophen (NORCO/VICODIN) 5-325 MG per tablet  Oral   Take 2 tablets by mouth every 4 (four) hours as needed for pain.   10 tablet   0   . ibuprofen (ADVIL) 200 MG tablet   Oral   Take 2 tablets (400 mg total) by mouth every 6 (six) hours as needed for pain.   30 tablet   0   . lovastatin (MEVACOR) 40 MG tablet   Oral   Take 1 tablet (40 mg total) by mouth at bedtime.   90 tablet   3   . magnesium hydroxide (PHILLIPS CHEWS) 311 MG CHEW   Oral   Chew 311 mg by mouth every 4 (four) hours as needed.         . Multiple Vitamins-Calcium (ONE-A-DAY WOMENS PO)   Oral   Take 1 tablet by mouth daily.           . sertraline (ZOLOFT) 50 MG tablet   Oral   Take 1 tablet (50 mg total) by mouth daily.   30 tablet   3    BP 149/105  Pulse 95  Temp(Src) 97.9 F (36.6 C) (Oral)  Resp 20  Ht 5\' 1"  (1.549 m)  Wt 158 lb (71.668 kg)  BMI 29.87 kg/m2  SpO2 99%  LMP 07/19/1988 Physical Exam   Constitutional: She is oriented to person, place, and time. She appears well-developed and well-nourished. No distress.  HENT:  Head: Normocephalic and atraumatic.  Mouth/Throat: Oropharynx is clear and moist. No oropharyngeal exudate.  Ecchymosis to right eyebrow Extraocular movements intact without pain  Eyes: Conjunctivae and EOM are normal. Pupils are equal, round, and reactive to light.  Neck: Normal range of motion. Neck supple.  Diffuse paraspinal tenderness, no midline deformity, step-off  Cardiovascular: Normal rate, regular rhythm and normal heart sounds.   No murmur heard. Pulmonary/Chest: Effort normal and breath sounds normal. No respiratory distress.  Abdominal: Soft. There is no tenderness. There is no rebound and no guarding.  Musculoskeletal: Normal range of motion. She exhibits tenderness. She exhibits no edema.  Ecchymosis to right lateral shoulder. +2 DP and PT pulse. Irregular 1 cm laceration that is transverse at the base of the fifth digit. Bleeding controlled. FDS, FDP, extensors intact. No T. or L-spine tenderness  Neurological: She is alert and oriented to person, place, and time. No cranial nerve deficit. She exhibits normal muscle tone. Coordination normal.  Skin: Skin is warm.    ED Course  Procedures (including critical care time) Labs Review Labs Reviewed - No data to display Imaging Review Dg Shoulder Right  04/10/2013   CLINICAL DATA:  Larey Seat onto shoulder and hand. Bruising and laceration.  EXAM: RIGHT SHOULDER - 2+ VIEW  COMPARISON:  Chest x-ray 02/27/2012  FINDINGS: No evidence for acute fracture. Degenerative changes are seen in the shoulder. Right lung apex is unremarkable in appearance.  IMPRESSION: Negative exam.   Electronically Signed   By: Rosalie Gums M.D.   On: 04/10/2013 13:03   Ct Head Wo Contrast  04/10/2013   CLINICAL DATA:  Fall.  EXAM: CT HEAD WITHOUT CONTRAST  CT CERVICAL SPINE WITHOUT CONTRAST  TECHNIQUE: Multidetector CT imaging of  the head and cervical spine was performed following the standard protocol without intravenous contrast. Multiplanar CT image reconstructions of the cervical spine were also generated.  COMPARISON:  04/09/2013 brain MR. February 27, 2012 head CT and cervical spine CT.  FINDINGS: CT HEAD FINDINGS  Soft tissue swelling right lateral orbital region. No underlying fracture or intracranial hemorrhage noted.  Global  atrophy without hydrocephalus.  Small vessel disease type changes without CT evidence of large acute infarct.  No intracranial mass lesion noted on this unenhanced exam.  Vascular calcifications.  Orbital structures appear to be grossly intact.  CT CERVICAL SPINE FINDINGS  No cervical spine fracture or malalignment. 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  No abnormal prevertebral soft tissue swelling.  No primary worrisome neck mass identified.  IMPRESSION: CT HEAD IMPRESSION  Soft tissue swelling right lateral orbital region. No underlying fracture or intracranial hemorrhage noted.  CT CERVICAL SPINE IMPRESSION  No cervical spine fracture or malalignment.  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   Electronically Signed   By: Bridgett Larsson   On: 04/10/2013 12:33   Ct Cervical Spine Wo Contrast  04/10/2013   CLINICAL DATA:  Fall.  EXAM: CT HEAD WITHOUT CONTRAST  CT CERVICAL SPINE WITHOUT CONTRAST  TECHNIQUE: Multidetector CT imaging of the head and cervical spine was performed following the standard protocol without intravenous contrast. Multiplanar CT image reconstructions of the cervical spine were also generated.  COMPARISON:  04/09/2013 brain MR. February 27, 2012 head CT and cervical spine CT.  FINDINGS: CT HEAD FINDINGS  Soft tissue swelling right lateral orbital region. No underlying fracture or intracranial hemorrhage noted.  Global atrophy without  hydrocephalus.  Small vessel disease type changes without CT evidence of large acute infarct.  No intracranial mass lesion noted on this unenhanced exam.  Vascular calcifications.  Orbital structures appear to be grossly intact.  CT CERVICAL SPINE FINDINGS  No cervical spine fracture or malalignment. 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  No abnormal prevertebral soft tissue swelling.  No primary worrisome neck mass identified.  IMPRESSION: CT HEAD IMPRESSION  Soft tissue swelling right lateral orbital region. No underlying fracture or intracranial hemorrhage noted.  CT CERVICAL SPINE IMPRESSION  No cervical spine fracture or malalignment.  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   Electronically Signed   By: Bridgett Larsson   On: 04/10/2013 12:33   Mr Brain Wo Contrast  04/09/2013   *RADIOLOGY REPORT*  Clinical Data: Syncope, tremors, headaches and confusion, with right-sided numbness and weakness. Depression.  MRI HEAD WITHOUT CONTRAST  Technique:  Multiplanar, multiecho pulse sequences of the brain and surrounding structures were obtained according to standard protocol without intravenous contrast.  Comparison: 02/28/2012.  Findings: There is no evidence for acute infarction, intracranial hemorrhage, mass lesion, hydrocephalus, or extra-axial fluid. Moderate atrophy.  Prominent perivascular spaces. Moderate chronic microvascular ischemic change affects the periventricular and subcortical white matter.  Partial empty sella.  No worrisome osseous findings.  Flow voids are maintained.  No visible orbital, sinus, or mastoid pathology.  Compared with priors, a similar appearance was noted.  IMPRESSION: Chronic changes as described.  No acute intracranial abnormality.   Original Report Authenticated By: Davonna Belling, M.D.   Dg Hand Complete Right  04/10/2013    CLINICAL DATA:  Larey Seat onto shoulder and hand. Bruising, laceration.  EXAM: RIGHT HAND - COMPLETE 3+ VIEW  COMPARISON:  None.  FINDINGS: There are degenerative changes, primarily at the 1st carpometacarpal joint. No evidence for acute fracture or subluxation. No radiopaque foreign body or soft tissue gas.  IMPRESSION: 1. Degenerative changes. 2.  No evidence for acute  abnormality.   Electronically  Signed   By: Rosalie Gums M.D.   On: 04/10/2013 13:04    MDM   1. Fall, initial encounter   2. Laceration of palm, right, initial encounter   3. Multiple contusions    Mechanical fall with multiple contusions and laceration to hand. Denies loss of consciousness. No dizziness or lightheadedness or chest pain or shortness of breath. Hand laceration without evidence of tendon or nerve involvement.  CT head and C spine negative. Xrays negative for fracture. Laceration repaired by PAC [place].  Tolerating PO and ambulatory in the ED without assistance.  Followup for suture removal in 5-7 days. Return precautions discussed.   Glynn Octave, MD 04/10/13 [phone]

## 2013-04-10 NOTE — ED Notes (Signed)
Pt reports slip and fall while going to the mail box this am. Pt states her sandal got caught in a crack in the cement of her driveway. Pt states she did hit her head, but denies any loc, contusion noted to outer corner of right eye. Contusion noted to right shoulder. approx 1" laceration noted to palmar side of hand near base of pinky, small amt active bleeding noted, pt instructed to apply pressure.

## 2013-04-10 NOTE — ED Provider Notes (Signed)
Medical screening examination/treatment/procedure(s) were conducted as a shared visit with non-physician practitioner(s) and myself.  I personally evaluated the patient during the encounter   Glynn Octave, MD 04/10/13 [phone]

## 2013-04-10 NOTE — ED Notes (Signed)
Amb without difficulty

## 2013-04-13 ENCOUNTER — Ambulatory Visit: Payer: Medicare Other | Admitting: Neurology

## 2013-04-16 ENCOUNTER — Other Ambulatory Visit: Payer: Self-pay | Admitting: General Practice

## 2013-04-16 ENCOUNTER — Telehealth: Payer: Self-pay | Admitting: General Practice

## 2013-04-16 MED ORDER — VENLAFAXINE HCL 37.5 MG PO TABS: ORAL_TABLET | ORAL | Status: DC

## 2013-04-16 MED ORDER — DIAZEPAM 5 MG PO TABS: 5.0000 mg | ORAL_TABLET | Freq: Two times a day (BID) | ORAL | Status: DC | PRN

## 2013-04-16 NOTE — Telephone Encounter (Signed)
Pt called the office today and states that the sertraline is making her 100% worse. States she is having jitters, and jitters. Pt would like to try effexor instead.    Uses [facility].

## 2013-04-16 NOTE — Telephone Encounter (Signed)
Please advise as to what to change the zoloft to and the dose. Also how long should she wait in between medications?

## 2013-04-16 NOTE — Telephone Encounter (Signed)
Pt daughter notified and states an understanding.

## 2013-04-16 NOTE — Telephone Encounter (Signed)
I usually start with xanax because is shorter acting.  As pt age drug metabolism is slow--- valium would stay in system much longer-- we can try 5 mg valium #30 but prefer it is not taken every day

## 2013-04-16 NOTE — Telephone Encounter (Signed)
Pt called back stating that her daughter is a Engineer, civil (consulting) and would like to know if Valium is better for the pt instead of xanax due to pt still not being able to sleep through the night.

## 2013-04-16 NOTE — Telephone Encounter (Signed)
She can switch to valium right away.  Come off sertraline and start effexor in 1 week effexor xr 37.5  #60  1 po qd for 1 week then increase to 2 po qd ---- ov 1 month

## 2013-04-20 ENCOUNTER — Ambulatory Visit (INDEPENDENT_AMBULATORY_CARE_PROVIDER_SITE_OTHER): Payer: Medicare Other | Admitting: Neurology

## 2013-04-20 ENCOUNTER — Encounter: Payer: Self-pay | Admitting: Neurology

## 2013-04-20 VITALS — BP 138/72 | HR 72 | Temp 97.9°F | Resp 14 | Wt 157.0 lb

## 2013-04-20 DIAGNOSIS — G20 Parkinson's disease: Secondary | ICD-10-CM

## 2013-04-20 DIAGNOSIS — F411 Generalized anxiety disorder: Secondary | ICD-10-CM

## 2013-04-20 DIAGNOSIS — R259 Unspecified abnormal involuntary movements: Secondary | ICD-10-CM

## 2013-04-20 DIAGNOSIS — G20A1 Parkinson's disease without dyskinesia, without mention of fluctuations: Secondary | ICD-10-CM | POA: Insufficient documentation

## 2013-04-20 DIAGNOSIS — R251 Tremor, unspecified: Secondary | ICD-10-CM

## 2013-04-20 MED ORDER — CARBIDOPA-LEVODOPA 25-100 MG PO TABS: 1.0000 | ORAL_TABLET | Freq: Three times a day (TID) | ORAL | Status: DC

## 2013-04-20 NOTE — Patient Instructions (Addendum)
1.  Start carbidopa/levodopa 25/100 as follows:  1/2 tab three times a day before meals x 1 wk, then 1/2 in am & noon & 1 in evening for a week, then 1/2 in am &1 at noon &one in evening for a week, then 1 tablet three times a day before meals  2.  Exercise safely  3.  We will send referral to the neurorehab center  4.  Please make a follow up appointment for mid December on your way out. Thank You!

## 2013-04-20 NOTE — Progress Notes (Signed)
Elizabeth Mccullough was seen today in the movement disorders clinic for neurologic consultation at the request of Loreen Freud, DO.  The consultation is for the evaluation of tremor.  Prior medical records were reviewed.  She is accompanied by her friend who supplements the hx.  The pt noted that she began to have tremor about a year ago.  It has increased over the year.  I noted in med records that the pt went to the ED on 04/10/13 after a fall.  Her sandal had caught on the sidewalk and she had to have stitches in her finger.  Specific Symptoms:  Tremor: yes Voice: more hypophonic Sleep: intermittent sleep trouble, takes valium at night (previously xanax).  Vivid Dreams:  no  Acting out dreams:  no but lives by self (previously told that talks in sleep) Wet Pillows: yes Postural symptoms:  yes but only when gets up in AM and feels little off balance  Falls?  yes, one last month and tripped over sandal Bradykinesia symptoms: shuffling gait, slow movements and difficulty getting out of a chair; even when sitting will lean to the L Loss of smell:  no Loss of taste:  no Urinary Incontinence:  no, urinary frequency Difficulty Swallowing:  no Handwriting, micrographia: yes Trouble with ADL's:  no but trouble using R arm (R arm feels heavy)  Trouble buttoning clothing: unknown Depression:  no per pt, but perhaps per friend; very anxious Memory changes:  no Hallucinations:  no  visual distortions: no N/V:  yes - nausea over last 2 weeks and attributes to anxiety Lightheaded:  no  Syncope: yes - one episode 2 months ago and had not eaten but had a drink with alcohol and got up and fell and lost consciousness for just seconds (friend thinks that it had nothing to do with drink) Diplopia:  no Dyskinesia:  no  Neuroimaging has previously been performed.  It is available for my review today.  An MRI of the brain was performed and 04/11/13, demonstrating mild to moderate small vessel disease and  mild atrophy.  The formal report is below.   *RADIOLOGY REPORT*  Clinical Data: Syncope, tremors, headaches and confusion, with  right-sided numbness and weakness. Depression.  MRI HEAD WITHOUT CONTRAST  Technique: Multiplanar, multiecho pulse sequences of the brain and  surrounding structures were obtained according to standard protocol  without intravenous contrast.  Comparison: 02/28/2012.  Findings: There is no evidence for acute infarction, intracranial  hemorrhage, mass lesion, hydrocephalus, or extra-axial fluid.  Moderate atrophy. Prominent perivascular spaces. Moderate chronic  microvascular ischemic change affects the periventricular and  subcortical white matter. Partial empty sella. No worrisome  osseous findings. Flow voids are maintained. No visible orbital,  sinus, or mastoid pathology.  Compared with priors, a similar appearance was noted.  PREVIOUS MEDICATIONS: none to date  ALLERGIES:   Allergies  Allergen Reactions  . Penicillins Hives  . Iohexol Rash     Code: RASH, Desc: PATIENT STATES SHE IS ALLERGIC TO IV DYE. 20 YRS AGO SHE HAD A REACTION AT [facility], PT WAS GIVEN BENADRYL. 07/13/06/RM, Onset Date: [id number]     CURRENT MEDICATIONS:  Current Outpatient Prescriptions on File Prior to Visit  Medication Sig Dispense Refill  . aspirin 325 MG EC tablet Take 81 mg by mouth 2 (two) times daily.       . diazepam (VALIUM) 5 MG tablet Take 1 tablet (5 mg total) by mouth every 12 (twelve) hours as needed for anxiety.  30 tablet  1  . ibuprofen (ADVIL) 200 MG tablet Take 2 tablets (400 mg total) by mouth every 6 (six) hours as needed for pain.  30 tablet  0  . lovastatin (MEVACOR) 40 MG tablet Take 1 tablet (40 mg total) by mouth at bedtime.  90 tablet  3  . magnesium hydroxide (PHILLIPS CHEWS) 311 MG CHEW Chew 311 mg by mouth every 4 (four) hours as needed.      . venlafaxine (EFFEXOR) 37.5 MG tablet Take 1 tablet by mouth every day for 1 week. Then increase to  two tablets by mouth daily.  60 tablet  1   No current facility-administered medications on file prior to visit.    PAST MEDICAL HISTORY:   Past Medical History  Diagnosis Date  . Hyperlipidemia   . AAA (abdominal aortic aneurysm)     PAST SURGICAL HISTORY:   Past Surgical History  Procedure Laterality Date  . Hammer toe surgery  04/10/02    Left Toe  . Breast cyst aspiration  1965    Right Breast  . Nasal septum surgery  1980    SOCIAL HISTORY:   History   Social History  . Marital Status: Widowed    Spouse Name: N/A    Number of Children: N/A  . Years of Education: N/A   Occupational History  . retired    Social History Main Topics  . Smoking status: Never Smoker   . Smokeless tobacco: Never Used  . Alcohol Use: No  . Drug Use: No  . Sexual Activity: Not Currently    Partners: Male   Other Topics Concern  . Not on file   Social History Narrative   Exercise-- no    FAMILY HISTORY:   Family Status  Relation Status Death Age  . Mother Deceased 50    chf  . Father Deceased 55    addisons  . Sister Alive     62  . Brother Deceased birth  . Sister Alive     22  . Sister Deceased   . Sister Deceased 59  . Brother Deceased   . Brother Deceased 66    bleeding ulcer, heart  . Brother Deceased 2    pneumonia    ROS:  A complete 10 system review of systems was obtained and was unremarkable apart from what is mentioned above.  PHYSICAL EXAMINATION:    VITALS:   Filed Vitals:   04/20/13 0900  BP: 138/72  Pulse: 72  Temp: 97.9 F (36.6 C)  Resp: 14  Weight: 157 lb (71.215 kg)    GEN:  The patient appears stated age and is in NAD. HEENT:  Normocephalic, atraumatic.  The mucous membranes are moist. The superficial temporal arteries are without ropiness or tenderness. CV:  RRR Lungs:  CTAB Neck/HEME:  There are no carotid bruits bilaterally.  Neurological examination:  Orientation: The patient is alert and oriented x3. Fund of knowledge is  appropriate.  Recent and remote memory are intact.  Attention and concentration are normal.    Able to name objects and repeat phrases. Cranial nerves: There is good facial symmetry. Pupils are equal round and reactive to light bilaterally. Fundoscopic exam reveals clear margins bilaterally. Extraocular muscles are intact. There are no square wave jerks. The visual fields are full to confrontational testing. The speech is fluent and clear.  It is hypophonic.  The patient is able to make the gutteral sounds without difficulty.  Soft palate rises symmetrically and there is no tongue  deviation. Hearing is intact to conversational tone. Sensation: Sensation is intact to light and pinprick throughout (facial, trunk, extremities). Vibration is intact at the bilateral big toe. There is no extinction with double simultaneous stimulation. There is no sensory dermatomal level identified. Motor: Strength is 5/5 in the bilateral upper and lower extremities.   Shoulder shrug is equal and symmetric.  There is no pronator drift. Deep tendon reflexes: Deep tendon reflexes are 2/4 at the bilateral biceps, triceps, brachioradialis, patella and achilles. Plantar responses are downgoing bilaterally.  Movement examination: Tone: There is increased tone in the right upper and lower extremities.  Tone in the left upper extremity was normal, as was tone in the left lower extremity.   Abnormal movements: There is a mild, constant resting tremor in the right upper extremity. Coordination:  There is definite decremation with RAM's, seen in both upper extremities with finger taps and hand opening and closing.  Heel taps and toe taps were fairly good bilaterally.  The right upper extremity was worse than the left. Gait and Station: The patient has mild difficulty arising out of a deep-seated chair without the use of the hands.  It took her 2 attempts, but she was able to arise out of the chair without using her hands.  The patient's  stride length is decreased.  She turns en bloc.  She has decreased arm swing on the right.  The patient has a negative pull test.      ASSESSMENT/PLAN:  1.  Parkinson's disease, new diagnosis today, 04/20/2013.  -We discussed the diagnosis as well as pathophysiology of the disease.  We discussed treatment options as well as prognostic indicators.  Patient education was provided.  -Greater than 50% of the 80 minute visit was spent in counseling answering questions and talking about what to expect now as well as in the future.  We talked about medication options as well as potential future surgical options.  We talked about safety in the home.  -We decided to add carbidopa/levodopa 25/100.  1/2 tab tid x 1 wk, then 1/2 in am & noon & 1 at evening for a week, then 1/2 in am &1 at noon &night for a week, then 1 po tid.  Risks, benefits, side effects and alternative therapies were discussed.  The opportunity to ask questions was given and they were answered to the best of my ability.  The patient expressed understanding and willingness to follow the outlined treatment protocols.  -I will refer the patient to the Parkinson's program at the neurorehabilitation Center, for PT/OT and ST.  We talked about the importance of cardiovascular exercise in Parkinson's disease.  She is not excited to go to therapy, but I talked to her about the importance and she was at least agreeable to try an evaluation.  -I. gave the patient patient information.  I gave her information on the Parkinson's support group that meets with the third Tuesday of every month at the [facility]. 2.  Anxiety and ? Depression  -I. talked to her about the fact that this can be manifestations of Parkinson's disease and low dopamine.  I agree with the Effexor.  -I do not think that she should be taking 2 benzodiazepines, as she is currently taking some Valium and some Xanax.  I advised against this today. 3.  I will plan on seeing her back in  the next 8-10 weeks.

## 2013-04-23 ENCOUNTER — Telehealth: Payer: Self-pay | Admitting: Neurology

## 2013-04-23 NOTE — Telephone Encounter (Signed)
Received Triage call report regarding patient not tolerating her medications. I called her back and upon further questioning, it appears that she started flexiril and carbidopa-levadopa the same time.  She feels very tired/groggy and I recommended that she stop taking flexiril. All questions were answered.  Donika K. Allena Katz, DO

## 2013-04-24 NOTE — Telephone Encounter (Signed)
Received call from patient's daughter regarding medications.  Apparently, patient was mistakenly telling her daughter and myself that she was taking flexiril, when in fact, she was taking effexor.  She got very anxious because she misinterpreted Dr. Don Perking telling her to stop taking valium and xanax, when the instructions were to only take one, not both benzodiazepines.  Medications were clarified with daughter who is managing them for patient.  Hassell Patras K. Allena Katz, DO

## 2013-04-25 ENCOUNTER — Other Ambulatory Visit: Payer: Self-pay | Admitting: Family Medicine

## 2013-04-26 ENCOUNTER — Telehealth: Payer: Self-pay | Admitting: *Deleted

## 2013-04-26 NOTE — Telephone Encounter (Signed)
Patient just started the Effexor and it is making her jittery and she is have SOB since starting the medications, she is concerned about increasing the medication since she is having these side effects, she is also feeling tired. She took the last pill at 1 pm. She is not in any distress at this time, and she agreed to receive a call back in the morning. I made her aware if she becomes short of breath, has CP, Numbness anywhere to go to the ED and she agreed. Patient would like somrhting different and if so, can will give something that we can provide samples of before we call in a script.  Please advise    KP

## 2013-04-26 NOTE — Telephone Encounter (Signed)
Pt is actually supposed to be taking both at the same time----it is supposed to be xr ---is that what she has on the bottle?  If it is not it may be why she is jittery.

## 2013-04-26 NOTE — Telephone Encounter (Signed)
Can try brintellix 10 mg  1/2 tab po qd

## 2013-04-26 NOTE — Telephone Encounter (Signed)
Pt states that she is suppose to start taking Effexor BID on tomorrow and the medication is making her feel jittery and tired. Patient would like to know if she still should start the BID tomorrow. Please advise. SW

## 2013-04-27 ENCOUNTER — Ambulatory Visit (HOSPITAL_COMMUNITY): Payer: Medicare Other | Attending: Internal Medicine

## 2013-04-27 ENCOUNTER — Encounter: Payer: Self-pay | Admitting: Family Medicine

## 2013-04-27 ENCOUNTER — Ambulatory Visit (INDEPENDENT_AMBULATORY_CARE_PROVIDER_SITE_OTHER): Payer: Medicare Other | Admitting: Family Medicine

## 2013-04-27 VITALS — BP 134/80 | HR 89 | Temp 98.2°F | Wt 154.4 lb

## 2013-04-27 DIAGNOSIS — M79609 Pain in unspecified limb: Secondary | ICD-10-CM

## 2013-04-27 DIAGNOSIS — R229 Localized swelling, mass and lump, unspecified: Secondary | ICD-10-CM

## 2013-04-27 DIAGNOSIS — I82819 Embolism and thrombosis of superficial veins of unspecified lower extremities: Secondary | ICD-10-CM | POA: Insufficient documentation

## 2013-04-27 DIAGNOSIS — N390 Urinary tract infection, site not specified: Secondary | ICD-10-CM

## 2013-04-27 DIAGNOSIS — M79662 Pain in left lower leg: Secondary | ICD-10-CM

## 2013-04-27 DIAGNOSIS — G20 Parkinson's disease: Secondary | ICD-10-CM

## 2013-04-27 DIAGNOSIS — Z7982 Long term (current) use of aspirin: Secondary | ICD-10-CM | POA: Insufficient documentation

## 2013-04-27 DIAGNOSIS — E785 Hyperlipidemia, unspecified: Secondary | ICD-10-CM | POA: Insufficient documentation

## 2013-04-27 DIAGNOSIS — I8310 Varicose veins of unspecified lower extremity with inflammation: Secondary | ICD-10-CM

## 2013-04-27 DIAGNOSIS — F411 Generalized anxiety disorder: Secondary | ICD-10-CM

## 2013-04-27 DIAGNOSIS — F419 Anxiety disorder, unspecified: Secondary | ICD-10-CM

## 2013-04-27 LAB — POCT URINALYSIS DIPSTICK
Glucose, UA: NEGATIVE
Ketones, UA: NEGATIVE
Nitrite, UA: NEGATIVE
Spec Grav, UA: <=1.005

## 2013-04-27 MED ORDER — DIAZEPAM 5 MG PO TABS: ORAL_TABLET | ORAL | Status: DC

## 2013-04-27 MED ORDER — VENLAFAXINE HCL ER 75 MG PO CP24: 75.0000 mg | ORAL_CAPSULE | Freq: Every day | ORAL | Status: DC

## 2013-04-27 NOTE — Patient Instructions (Signed)

## 2013-04-27 NOTE — Telephone Encounter (Signed)
Spoke with patient and she stated she did not take the Effexor but she took the Carbidopa-levodopa and she ate her breakfast and she is having that shaky feeling. She stated he daughter called Dr.Tat but she is not sure what they discussed, she does not think the effexor is causing the trouble. The patient wanted to come in to discuss so I scheduled her for today at 11;15 and she also stated she had a UTI.       KP

## 2013-04-28 ENCOUNTER — Encounter: Payer: Self-pay | Admitting: Family Medicine

## 2013-04-28 DIAGNOSIS — G20 Parkinson's disease: Secondary | ICD-10-CM | POA: Insufficient documentation

## 2013-04-28 DIAGNOSIS — M79662 Pain in left lower leg: Secondary | ICD-10-CM | POA: Insufficient documentation

## 2013-04-28 NOTE — Assessment & Plan Note (Signed)
Doppler--+ superficial clot--- ankle to above knee xaralto 15 mg bid for 21 days then 20 mg daily for the rest of the 3 months  Then switch to asa 81 mg  2 po qd Repeat doppler in 6 weeks Check d dimer

## 2013-04-28 NOTE — Progress Notes (Signed)
  LSubjective:     Elizabeth Mccullough is a 72 y.o. female who presents for follow up of anxiety disorder and panic attacks. She has the following anxiety symptoms: difficulty concentrating, fatigue, palpitations, panic attacks and shortness of breath. Onset of symptoms was approximately several weeks ago. Symptoms have been gradually worsening since that time. She denies current suicidal and homicidal ideation. Family history significant for no psychiatric illness. Risk factors: negative life event death of husband ad dx parkinsons. Previous treatment includes Xanax and Zoloft. She complains of the following medication side effects: none. The following portions of the patient's history were reviewed and updated as appropriate: allergies, current medications, past family history, past medical history, past social history, past surgical history and problem list.  Review of Systems Pertinent items are noted in HPI.    Objective:    BP 134/80  Pulse 89  Temp(Src) 98.2 F (36.8 C) (Oral)  Wt 154 lb 6.4 oz (70.035 kg)  BMI 29.19 kg/m2  SpO2 98%  LMP 07/19/1988 General appearance: alert, cooperative, appears stated age and no distress Throat: lips, mucosa, and tongue normal; teeth and gums normal Neck: no adenopathy, no carotid bruit, no JVD, supple, symmetrical, trachea midline and thyroid not enlarged, symmetric, no tenderness/mass/nodules Lungs: clear to auscultation bilaterally Heart: S1, S2 normal Extremities: L calf pain and ropiness     Assessment:    anxiety disorder and panic attacks. Possible organic contributing causes are: neuro.   Plan:    Medications: Effexor and Valium. Handouts describing disease, natural history, and treatment were given to the patient. Reviewed concept of anxiety as biochemical imbalance of neurotransmitters and rationale for treatment. Follow up: 1 month. Spent 40 minutes (>50% of visit) discussing the risks of anxiety disorder and panic attacks, the   pathophysiology, etiology, risks, and principles of treatment.

## 2013-04-28 NOTE — Assessment & Plan Note (Signed)
Per neuro 

## 2013-04-29 LAB — URINE CULTURE
Colony Count: NO GROWTH
Organism ID, Bacteria: NO GROWTH

## 2013-04-30 ENCOUNTER — Other Ambulatory Visit: Payer: Self-pay | Admitting: Family Medicine

## 2013-04-30 ENCOUNTER — Telehealth: Payer: Self-pay | Admitting: *Deleted

## 2013-04-30 DIAGNOSIS — I809 Phlebitis and thrombophlebitis of unspecified site: Secondary | ICD-10-CM

## 2013-04-30 NOTE — Telephone Encounter (Signed)
Pt states that she was prescribed xaralto on Friday. Patient states that her daughter is concerned about the medication because of the commercials on tv that states that the medication has class action lawsuits against them Pt states that she is not sure if she should continue the medication. Pt also wants to know if she could just take aspirin TID.   Please advise. SW

## 2013-04-30 NOTE — Telephone Encounter (Signed)
Take 325 mg asa daily and refer to hematology for evaluation

## 2013-04-30 NOTE — Telephone Encounter (Signed)
Informed patient of recommendations.Pt stated that she wants to know if she should continue the aspirin and if she can take ibuprofen for pain. Also patient states that she believes this medication is making her sick.

## 2013-04-30 NOTE — Telephone Encounter (Signed)
Any med to help with clots can cause bleeding --- she would only need to be on it for 3 months--- and then take 2, 81 mg aspirin daily after that.  Normally a superficial clot only requires aspirin but because hers is so extensive it would be wise to do a little more.   Other option is coumadin for 3 months but that requires blood work very frequently---couple times a week to monthly.  We should also draw a clotting panal

## 2013-05-01 ENCOUNTER — Ambulatory Visit: Payer: Medicare Other | Admitting: Occupational Therapy

## 2013-05-01 ENCOUNTER — Ambulatory Visit: Payer: Medicare Other | Admitting: Neurology

## 2013-05-01 ENCOUNTER — Ambulatory Visit: Payer: Medicare Other | Attending: Neurology | Admitting: Physical Therapy

## 2013-05-01 DIAGNOSIS — Z5189 Encounter for other specified aftercare: Secondary | ICD-10-CM | POA: Insufficient documentation

## 2013-05-01 DIAGNOSIS — M629 Disorder of muscle, unspecified: Secondary | ICD-10-CM | POA: Insufficient documentation

## 2013-05-01 DIAGNOSIS — M2420 Disorder of ligament, unspecified site: Secondary | ICD-10-CM | POA: Insufficient documentation

## 2013-05-01 DIAGNOSIS — G20A1 Parkinson's disease without dyskinesia, without mention of fluctuations: Secondary | ICD-10-CM | POA: Insufficient documentation

## 2013-05-01 DIAGNOSIS — M25519 Pain in unspecified shoulder: Secondary | ICD-10-CM | POA: Insufficient documentation

## 2013-05-01 DIAGNOSIS — R5381 Other malaise: Secondary | ICD-10-CM | POA: Insufficient documentation

## 2013-05-01 DIAGNOSIS — R279 Unspecified lack of coordination: Secondary | ICD-10-CM | POA: Insufficient documentation

## 2013-05-01 DIAGNOSIS — G20 Parkinson's disease: Secondary | ICD-10-CM | POA: Insufficient documentation

## 2013-05-03 ENCOUNTER — Ambulatory Visit: Payer: Medicare Other

## 2013-05-08 ENCOUNTER — Ambulatory Visit: Payer: Medicare Other | Admitting: Physical Therapy

## 2013-05-09 ENCOUNTER — Telehealth: Payer: Self-pay | Admitting: *Deleted

## 2013-05-09 ENCOUNTER — Other Ambulatory Visit: Payer: Self-pay | Admitting: *Deleted

## 2013-05-09 MED ORDER — ONDANSETRON HCL 4 MG PO TABS: 4.0000 mg | ORAL_TABLET | Freq: Three times a day (TID) | ORAL | Status: DC | PRN

## 2013-05-09 NOTE — Telephone Encounter (Signed)
Zofran sent to pharmacy. Patient notified

## 2013-05-09 NOTE — Telephone Encounter (Signed)
Patient stated that she isn't sure why she is nauseous. She states that it started 3 weeks ago and mainly happens first thing in the morning and doesn't subside until the evening.

## 2013-05-09 NOTE — Telephone Encounter (Signed)
Call in 15 zofram 4 mg but I want to see here if it does not completely resolve within a few days.

## 2013-05-09 NOTE — Telephone Encounter (Signed)
Patient called and stated that she is experiencing nausea. She stated that she took one of her daughters Zofran pills and it worked very well for her. She would like to know if a prescription for zofran odt can be sent to [facility]. Please advise.

## 2013-05-09 NOTE — Telephone Encounter (Signed)
Please advise 

## 2013-05-09 NOTE — Telephone Encounter (Signed)
Why is she nauseous?  Is it from med?

## 2013-05-10 ENCOUNTER — Ambulatory Visit: Payer: Medicare Other | Admitting: Occupational Therapy

## 2013-05-14 ENCOUNTER — Encounter: Payer: Medicare Other | Admitting: Occupational Therapy

## 2013-05-15 ENCOUNTER — Ambulatory Visit: Payer: Medicare Other | Admitting: Physical Therapy

## 2013-05-15 ENCOUNTER — Encounter: Payer: Medicare Other | Admitting: Occupational Therapy

## 2013-05-16 ENCOUNTER — Telehealth: Payer: Self-pay | Admitting: *Deleted

## 2013-05-16 NOTE — Telephone Encounter (Signed)
Marlou Porch from Hematology office where patient was referred called and stated that the patient is refusing to be seen. Patient states that she doesn't understand why she was being referred in the first place. Raiford Noble stated that she was going to call back if she thinks that she would need an appointment. SW, CMA

## 2013-05-16 NOTE — Telephone Encounter (Signed)
She should see hematology because of the clot in her leg--- if she refuses-- document refusal

## 2013-05-21 ENCOUNTER — Ambulatory Visit: Payer: Medicare Other | Admitting: Physical Therapy

## 2013-05-21 ENCOUNTER — Ambulatory Visit: Payer: Medicare Other | Admitting: Family Medicine

## 2013-05-21 ENCOUNTER — Encounter: Payer: Medicare Other | Admitting: Occupational Therapy

## 2013-05-23 ENCOUNTER — Encounter: Payer: Medicare Other | Admitting: Occupational Therapy

## 2013-05-23 ENCOUNTER — Ambulatory Visit: Payer: Medicare Other | Admitting: Physical Therapy

## 2013-05-24 ENCOUNTER — Other Ambulatory Visit: Payer: Self-pay

## 2013-05-24 ENCOUNTER — Ambulatory Visit (INDEPENDENT_AMBULATORY_CARE_PROVIDER_SITE_OTHER): Payer: Medicare Other | Admitting: *Deleted

## 2013-05-24 DIAGNOSIS — Z23 Encounter for immunization: Secondary | ICD-10-CM

## 2013-06-01 ENCOUNTER — Telehealth: Payer: Self-pay | Admitting: *Deleted

## 2013-06-01 DIAGNOSIS — I8290 Acute embolism and thrombosis of unspecified vein: Secondary | ICD-10-CM

## 2013-06-01 MED ORDER — ONDANSETRON 4 MG PO TBDP: 4.0000 mg | ORAL_TABLET | Freq: Three times a day (TID) | ORAL | Status: DC | PRN

## 2013-06-01 NOTE — Telephone Encounter (Signed)
Lm @ (5:24pm) asking the pt to RTC regarding med change and scheduling bloodwork and repeat dopple.  New rx sent to the pharmacy by e-script, future lab ordered and sent.//AB/CMA

## 2013-06-01 NOTE — Telephone Encounter (Signed)
Patient called and stated that she is currently taking zofran tablets and would like to know if she can switch to the zofran that is placed under the tongue. Patient states that she believes that this will work better for her. Please advise. SW

## 2013-06-01 NOTE — Telephone Encounter (Signed)
Ok to switch to ODT zofran Pt should also have repeat doppler end of this month and ddimer drawn to check on clot

## 2013-06-08 LAB — HM MAMMOGRAPHY: HM Mammogram: 5

## 2013-06-12 ENCOUNTER — Other Ambulatory Visit: Payer: Self-pay

## 2013-06-12 DIAGNOSIS — D381 Neoplasm of uncertain behavior of trachea, bronchus and lung: Secondary | ICD-10-CM

## 2013-06-20 ENCOUNTER — Other Ambulatory Visit: Payer: Self-pay | Admitting: *Deleted

## 2013-06-20 DIAGNOSIS — Z91041 Radiographic dye allergy status: Secondary | ICD-10-CM

## 2013-06-20 MED ORDER — PREDNISONE (PAK) 10 MG PO TABS
ORAL_TABLET | Freq: Every day | ORAL | Status: AC
Start: 2013-06-20 — End: 2013-06-21

## 2013-07-04 ENCOUNTER — Other Ambulatory Visit: Payer: Medicare Other

## 2013-07-04 ENCOUNTER — Ambulatory Visit: Payer: Medicare Other | Admitting: Cardiothoracic Surgery

## 2013-07-04 ENCOUNTER — Encounter: Payer: Self-pay | Admitting: Cardiothoracic Surgery

## 2013-07-04 ENCOUNTER — Ambulatory Visit
Admission: RE | Admit: 2013-07-04 | Discharge: 2013-07-04 | Disposition: A | Discharge status: Home / Self Care | Payer: Medicare Other | Source: Ambulatory Visit | Attending: Cardiothoracic Surgery | Admitting: Cardiothoracic Surgery

## 2013-07-04 ENCOUNTER — Ambulatory Visit (INDEPENDENT_AMBULATORY_CARE_PROVIDER_SITE_OTHER): Payer: Medicare Other | Admitting: Cardiothoracic Surgery

## 2013-07-04 ENCOUNTER — Other Ambulatory Visit: Payer: Self-pay | Admitting: Cardiothoracic Surgery

## 2013-07-04 VITALS — BP 120/78 | HR 90 | Resp 20 | Ht 61.0 in | Wt 152.0 lb

## 2013-07-04 DIAGNOSIS — D381 Neoplasm of uncertain behavior of trachea, bronchus and lung: Secondary | ICD-10-CM

## 2013-07-04 DIAGNOSIS — I712 Thoracic aortic aneurysm, without rupture: Secondary | ICD-10-CM

## 2013-07-04 LAB — CREATININE, SERUM: Creat: 0.7 mg/dL (ref 0.50–1.10)

## 2013-07-04 LAB — BUN: BUN: 15 mg/dL (ref 6–23)

## 2013-07-04 MED ORDER — IOHEXOL 300 MG/ML  SOLN
75.0000 mL | Freq: Once | INTRAMUSCULAR | Status: AC | PRN
  Administered 2013-07-04: 75 mL via INTRAVENOUS

## 2013-07-04 NOTE — Progress Notes (Signed)
PCP is Loreen Freud, DO Referring Provider is Lelon Perla, DO  Chief Complaint  Patient presents with  . Follow-up    1 year f/u with CTA Chest   . 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 It is asymptomatic. The patient's blood pressure is well-controlled. She is a nonsmoker. She has no history of aortic valve disease. Her echocardiogram in 2013 showed normal LV function normal aortic valve  Today 1.5 years after her previous exam her ascending aorta measures 4.1 cm in diameter no hematoma or ulceration noted. No significant change.  Past Medical History  Diagnosis Date  . Hyperlipidemia   . AAA (abdominal aortic aneurysm)     Past Surgical History  Procedure Laterality Date  . Hammer toe surgery  04/10/02    Left Toe  . Breast cyst aspiration  1965    Right Breast  . Nasal septum surgery  1980    Family History  Problem Relation Age of Onset  . Breast cancer    . Colon cancer    . Heart disease Mother   . Hyperlipidemia Sister   . Hyperlipidemia Brother   . Diabetes Sister   . Hyperlipidemia Sister   . Cancer Sister 83    colon  . Cancer Sister     breast  . Stroke Brother   . Hypertension Brother     Social History History  Substance Use Topics  . Smoking status: Never Smoker   . Smokeless tobacco: Never Used  . Alcohol Use: No    Current Outpatient Prescriptions  Medication Sig Dispense Refill  . aspirin 325 MG EC tablet Take 81 mg by mouth 2 (two) times daily.       . carbidopa-levodopa (SINEMET IR) 25-100 MG per tablet Take 1 tablet by mouth 3 (three) times daily.  90 tablet  4  . ibuprofen (ADVIL) 200 MG tablet Take 2 tablets (400 mg total) by mouth every 6 (six) hours as needed for pain.  30 tablet  0  . lovastatin (MEVACOR) 40 MG tablet Take 1 tablet (40 mg total) by mouth at bedtime.  90 tablet  3  . magnesium hydroxide (PHILLIPS CHEWS)  311 MG CHEW Chew 311 mg by mouth every 4 (four) hours as needed.      . ondansetron (ZOFRAN ODT) 4 MG disintegrating tablet Take 1 tablet (4 mg total) by mouth every 8 (eight) hours as needed for nausea or vomiting.  30 tablet  2  . venlafaxine XR (EFFEXOR XR) 75 MG 24 hr capsule Take 1 capsule (75 mg total) by mouth daily.  30 capsule  2   No current facility-administered medications for this visit.    Allergies  Allergen Reactions  . Penicillins Hives  . Iohexol Rash     Code: RASH, Desc: PATIENT STATES SHE IS ALLERGIC TO IV DYE. 20 YRS AGO SHE HAD A REACTION AT [facility], PT WAS GIVEN BENADRYL. 07/13/06/RM, Onset Date: [id number]     Review of Systems  Since her prior visit the patient has been diagnosed with Parkinson's disease. The patient has tripped  and fallen 2 or 3 times over the past year.  BP 120/78  Pulse 90  Resp 20  Ht 5\' 1"  (1.549 m)  Wt 152 lb (68.947 kg)  BMI 28.74 kg/m2  SpO2 97%  LMP 07/19/1988 Physical Exam  Alert and comfortable no  evidence of tremor Lungs clear Heart rhythm regular without murmur No pedal edema Good peripheral pulses   Diagnostic Tests: CT of thoracic aorta shows diameter 4.1 cm. Descending thoracic aorta of normal diameter. No at risk for a nodules noted.  Impression: no significant change in mild ascending thoracic aortic fusiform aneurysm  Plan: Continue current medications. We'll continue surveillance of her thoracic aorta with another scan in 1.5 years

## 2013-07-05 ENCOUNTER — Encounter: Payer: Self-pay | Admitting: Neurology

## 2013-07-05 ENCOUNTER — Ambulatory Visit (INDEPENDENT_AMBULATORY_CARE_PROVIDER_SITE_OTHER): Payer: Medicare Other | Admitting: Neurology

## 2013-07-05 VITALS — BP 140/84 | HR 80 | Temp 98.1°F | Resp 14 | Ht 61.0 in | Wt 150.2 lb

## 2013-07-05 DIAGNOSIS — F411 Generalized anxiety disorder: Secondary | ICD-10-CM

## 2013-07-05 DIAGNOSIS — G20 Parkinson's disease: Secondary | ICD-10-CM

## 2013-07-05 NOTE — Progress Notes (Signed)
Elizabeth Mccullough was seen today in the movement disorders clinic for neurologic consultation at the request of Loreen Freud, DO.  The consultation is for the evaluation of tremor.  Prior medical records were reviewed.  She is accompanied by her friend who supplements the hx.  The pt noted that she began to have tremor about a year ago.  It has increased over the year.  I noted in med records that the pt went to the ED on 04/10/13 after a fall.  Her sandal had caught on the sidewalk and she had to have stitches in her finger.  07/05/13 update:  The patient is following up today regarding her Parkinson's disease, which was just diagnosed last visit, in October 2014.  I started her on levodopa and referred her to the Parkinson's program.  The pt reports that she is taking OTC sleep medication which is helping.  She initially had nausea with the carbidopa/levodopa but is doing much better.  She really isn't sure if the nausea was from the carbidopa/levodopa 25/100 or from the antidepressant she had started but she feels good now.  She does state that she fell once since last visit.  She says that she just "wasn't looking down" and tripped walking around a car door. She insists that she is doing markedly better.  She started driving again.  "I feel almost normal."  She was supposed to go to physical therapy since our last visit, but admits that she had the initial evaluation and 2 subsequent visits, but this was when she was very nauseated and just could not tolerate the therapy.  She really is not doing any outside exercise. I did review records from other physicians since her last visit.  She had a lower extremity venous Doppler that did not demonstrate DVT but did demonstrate a rather extensive superficial thrombus that extended from above the knee to almost the ankle.   Neuroimaging has previously been performed.  It is available for my review today.  An MRI of the brain was performed and 04/11/13,  demonstrating mild to moderate small vessel disease and mild atrophy.  The formal report is below.   *RADIOLOGY REPORT*  Clinical Data: Syncope, tremors, headaches and confusion, with  right-sided numbness and weakness. Depression.  MRI HEAD WITHOUT CONTRAST  Technique: Multiplanar, multiecho pulse sequences of the brain and  surrounding structures were obtained according to standard protocol  without intravenous contrast.  Comparison: 02/28/2012.  Findings: There is no evidence for acute infarction, intracranial  hemorrhage, mass lesion, hydrocephalus, or extra-axial fluid.  Moderate atrophy. Prominent perivascular spaces. Moderate chronic  microvascular ischemic change affects the periventricular and  subcortical white matter. Partial empty sella. No worrisome  osseous findings. Flow voids are maintained. No visible orbital,  sinus, or mastoid pathology.  Compared with priors, a similar appearance was noted.  PREVIOUS MEDICATIONS: none to date  ALLERGIES:   Allergies  Allergen Reactions  . Penicillins Hives  . Iohexol Rash     Code: RASH, Desc: PATIENT STATES SHE IS ALLERGIC TO IV DYE. 20 YRS AGO SHE HAD A REACTION AT [facility], PT WAS GIVEN BENADRYL. 07/13/06/RM, Onset Date: [id number]     CURRENT MEDICATIONS:  Current Outpatient Prescriptions on File Prior to Visit  Medication Sig Dispense Refill  . aspirin 325 MG EC tablet Take 81 mg by mouth 2 (two) times daily.       . carbidopa-levodopa (SINEMET IR) 25-100 MG per tablet Take 1 tablet by mouth 3 (three) times daily.  90 tablet  4  . ibuprofen (ADVIL) 200 MG tablet Take 2 tablets (400 mg total) by mouth every 6 (six) hours as needed for pain.  30 tablet  0  . lovastatin (MEVACOR) 40 MG tablet Take 1 tablet (40 mg total) by mouth at bedtime.  90 tablet  3  . magnesium hydroxide (PHILLIPS CHEWS) 311 MG CHEW Chew 311 mg by mouth every 4 (four) hours as needed.      . ondansetron (ZOFRAN ODT) 4 MG disintegrating tablet Take  1 tablet (4 mg total) by mouth every 8 (eight) hours as needed for nausea or vomiting.  30 tablet  2  . venlafaxine XR (EFFEXOR XR) 75 MG 24 hr capsule Take 1 capsule (75 mg total) by mouth daily.  30 capsule  2   No current facility-administered medications on file prior to visit.    PAST MEDICAL HISTORY:   Past Medical History  Diagnosis Date  . Hyperlipidemia   . AAA (abdominal aortic aneurysm)     PAST SURGICAL HISTORY:   Past Surgical History  Procedure Laterality Date  . Hammer toe surgery  04/10/02    Left Toe  . Breast cyst aspiration  1965    Right Breast  . Nasal septum surgery  1980    SOCIAL HISTORY:   History   Social History  . Marital Status: Widowed    Spouse Name: N/A    Number of Children: N/A  . Years of Education: N/A   Occupational History  . retired    Social History Main Topics  . Smoking status: Never Smoker   . Smokeless tobacco: Never Used  . Alcohol Use: No  . Drug Use: No  . Sexual Activity: Not Currently    Partners: Male   Other Topics Concern  . Not on file   Social History Narrative   Exercise-- no    FAMILY HISTORY:   Family Status  Relation Status Death Age  . Mother Deceased 84    chf  . Father Deceased 66    addisons  . Sister Alive     73  . Brother Deceased birth  . Sister Alive     15  . Sister Deceased   . Sister Deceased 34  . Brother Deceased   . Brother Deceased 70    bleeding ulcer, heart  . Brother Deceased 2    pneumonia    ROS:  A complete 10 system review of systems was obtained and was unremarkable apart from what is mentioned above.  PHYSICAL EXAMINATION:    VITALS:   Filed Vitals:   07/05/13 0951  BP: 140/84  Pulse: 80  Temp: 98.1 F (36.7 C)  Resp: 14  Height: 5\' 1"  (1.549 m)  Weight: 150 lb 3.2 oz (68.13 kg)    GEN:  The patient appears stated age and is in NAD. HEENT:  Normocephalic, atraumatic.  The mucous membranes are moist. The superficial temporal arteries are without  ropiness or tenderness. CV:  RRR Lungs:  CTAB Neck/HEME:  There are no carotid bruits bilaterally.  Neurological examination:  Orientation: The patient is alert and oriented x3. Fund of knowledge is appropriate.  Recent and remote memory are intact.  Attention and concentration are normal.    Able to name objects and repeat phrases. Cranial nerves: There is good facial symmetry. Pupils are equal round and reactive to light bilaterally.  The speech is fluent and clear.  It is hypophonic.  The patient is able to make  the gutteral sounds without difficulty.  Soft palate rises symmetrically and there is no tongue deviation. Hearing is intact to conversational tone. Sensation: Sensation is intact to light throughout. Motor: Strength is 5/5 in the bilateral upper and lower extremities.   Shoulder shrug is equal and symmetric.  There is no pronator drift.  Movement examination: Tone: There is mild increased tone in the right upper extremity.  The left is normal. Abnormal movements: There is a mild resting tremor in the right upper extremity. Coordination:  There is no significant decremation with rapid alternating movements today. Gait and Station: The patient has no difficulty arising out of a deep-seated chair without the use of the hands.  She was able to arise on the first attempt.  She does have decreased arm swing bilaterally.  Stride length was much improved.        ASSESSMENT/PLAN:  1.  Parkinson's disease, new diagnosis, 04/20/2013.  -She looks better on the carbidopa/levodopa 25/100, one tablet 3 times per day.  She will continue on this.  -I encouraged her to exercise.  She does not want to go to therapy, but is interested in exercise classes.  I will contact the rehabilitation center and see if they can get her some information.  Pt education was provided. 2.  Anxiety and ? Depression  -She is doing much better on Effexor, and is now off of both benzodiazepines, including the Valium. 3.   I will plan on seeing her back in the next 3-4 months.

## 2013-07-24 ENCOUNTER — Other Ambulatory Visit: Payer: Self-pay | Admitting: Family Medicine

## 2013-07-25 ENCOUNTER — Ambulatory Visit: Payer: Medicare Other | Admitting: Cardiothoracic Surgery

## 2013-07-25 ENCOUNTER — Other Ambulatory Visit: Payer: Medicare Other

## 2013-08-20 ENCOUNTER — Encounter: Payer: Self-pay | Admitting: Family Medicine

## 2013-09-21 ENCOUNTER — Other Ambulatory Visit: Payer: Self-pay | Admitting: Neurology

## 2013-11-06 ENCOUNTER — Encounter: Payer: Self-pay | Admitting: Neurology

## 2013-11-06 ENCOUNTER — Ambulatory Visit (INDEPENDENT_AMBULATORY_CARE_PROVIDER_SITE_OTHER): Payer: Medicare Other | Admitting: Neurology

## 2013-11-06 VITALS — BP 118/82 | HR 62 | Resp 18 | Ht 61.5 in | Wt 151.0 lb

## 2013-11-06 DIAGNOSIS — G20 Parkinson's disease: Secondary | ICD-10-CM

## 2013-11-06 NOTE — Progress Notes (Signed)
Elizabeth Mccullough was seen today in the movement disorders clinic for neurologic consultation at the request of [name], DO.  The consultation is for the evaluation of tremor.  Prior medical records were reviewed.  She is accompanied by her friend who supplements the hx.  The pt noted that she began to have tremor about a year ago.  It has increased over the year.  I noted in med records that the pt went to the ED on 04/10/13 after a fall.  Her sandal had caught on the sidewalk and she had to have stitches in her finger.  07/05/13 update:  The patient is following up today regarding her Parkinson's disease, which was just diagnosed last visit, in October 2014.  I started her on levodopa and referred her to the Parkinson's program.  The pt reports that she is taking OTC sleep medication which is helping.  She initially had nausea with the carbidopa/levodopa but is doing much better.  She really isn't sure if the nausea was from the carbidopa/levodopa 25/100 or from the antidepressant she had started but she feels good now.  She does state that she fell once since last visit.  She says that she just "wasn't looking down" and tripped walking around a car door. She insists that she is doing markedly better.  She started driving again.  "I feel almost normal."  She was supposed to go to physical therapy since our last visit, but admits that she had the initial evaluation and 2 subsequent visits, but this was when she was very nauseated and just could not tolerate the therapy.  She really is not doing any outside exercise. I did review records from other physicians since her last visit.  She had a lower extremity venous Doppler that did not demonstrate DVT but did demonstrate a rather extensive superficial thrombus that extended from above the knee to almost the ankle.  11/06/13 update:  Pt is on carbidopa/levodopa 25/100 three times per day.  She is feeling better.  No hallucinations.  No falls.  "I can  walk, I can write, my mood is good."  She is tolerating the effexor well.  She is not exercising.     Neuroimaging has previously been performed.  It is available for my review today.  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  The formal report is below.   *RADIOLOGY REPORT*  Clinical Data: Syncope, tremors, headaches and confusion, with  right-sided numbness and weakness. Depression.  MRI HEAD WITHOUT CONTRAST  Technique: Multiplanar, multiecho pulse sequences of the brain and  surrounding structures were obtained according to standard protocol  without intravenous contrast.  Comparison: 02/28/2012.  Findings: There is no evidence for acute infarction, intracranial  hemorrhage, mass lesion, hydrocephalus, or extra-axial fluid.  Moderate atrophy. Prominent perivascular spaces. Moderate chronic  microvascular ischemic change affects the periventricular and  subcortical white matter. Partial empty sella. No worrisome  osseous findings. Flow voids are maintained. No visible orbital,  sinus, or mastoid pathology.  Compared with priors, a similar appearance was noted.  PREVIOUS MEDICATIONS: none to date  ALLERGIES:   Allergies  Allergen Reactions  . Penicillins Hives  . Iohexol Rash     Code: RASH, Desc: PATIENT STATES SHE IS ALLERGIC TO IV DYE. 20 YRS AGO SHE HAD A REACTION AT [facility], PT WAS GIVEN BENADRYL. 07/13/06/RM, Onset Date: [id number]     CURRENT MEDICATIONS:  Current Outpatient Prescriptions  on File Prior to Visit  Medication Sig Dispense Refill  . aspirin 325 MG EC tablet Take 81 mg by mouth 2 (two) times daily.       . carbidopa-levodopa (SINEMET IR) 25-100 MG per tablet TAKE 1 TABLET BY MOUTH 3 TIMES A DAY.  90 tablet  4  . ibuprofen (ADVIL) 200 MG tablet Take 2 tablets (400 mg total) by mouth every 6 (six) hours as needed for pain.  30 tablet  0  . lovastatin (MEVACOR) 40 MG tablet Take 1  tablet (40 mg total) by mouth at bedtime.  90 tablet  3  . magnesium hydroxide (PHILLIPS CHEWS) 311 MG CHEW Chew 311 mg by mouth every 4 (four) hours as needed.      . venlafaxine XR (EFFEXOR-XR) 75 MG 24 hr capsule TAKE 1 CAPSULE BY MOUTH DAILY.  30 capsule  5   No current facility-administered medications on file prior to visit.    PAST MEDICAL HISTORY:   Past Medical History  Diagnosis Date  . Hyperlipidemia   . AAA (abdominal aortic aneurysm)     PAST SURGICAL HISTORY:   Past Surgical History  Procedure Laterality Date  . Hammer toe surgery  04/10/02    Left Toe  . Breast cyst aspiration  1965    Right Breast  . Nasal septum surgery  1980    SOCIAL HISTORY:   History   Social History  . Marital Status: Widowed    Spouse Name: N/A    Number of Children: N/A  . Years of Education: N/A   Occupational History  . retired    Social History Main Topics  . Smoking status: Never Smoker   . Smokeless tobacco: Never Used  . Alcohol Use: No  . Drug Use: No  . Sexual Activity: Not Currently    Partners: Male   Other Topics Concern  . Not on file   Social History Narrative   Exercise-- no    FAMILY HISTORY:   Family Status  Relation Status Death Age  . Mother Deceased 62    chf  . Father Deceased 24    addisons  . Sister Alive     63  . Brother Deceased birth  . Sister Alive     28  . Sister Deceased   . Sister Deceased 7  . Brother Deceased   . Brother Deceased 43    bleeding ulcer, heart  . Brother Deceased 2    pneumonia    ROS:  A complete 10 system review of systems was obtained and was unremarkable apart from what is mentioned above.  PHYSICAL EXAMINATION:    VITALS:   Filed Vitals:   11/06/13 1033  BP: 118/82  Pulse: 62  Resp: 18  Height: 5' 1.5" (1.562 m)  Weight: 151 lb (68.493 kg)   Wt Readings from Last 3 Encounters:  11/06/13 151 lb (68.493 kg)  07/05/13 150 lb 3.2 oz (68.13 kg)  07/04/13 152 lb (68.947 kg)    GEN:  The  patient appears stated age and is in NAD. HEENT:  Normocephalic, atraumatic.  The mucous membranes are moist. The superficial temporal arteries are without ropiness or tenderness. CV:  RRR Lungs:  CTAB Neck/HEME:  There are no carotid bruits bilaterally.  Neurological examination:  Orientation: The patient is alert and oriented x3. Fund of knowledge is appropriate.  Recent and remote memory are intact.  Attention and concentration are normal.    Able to name objects and repeat phrases.  Cranial nerves: There is good facial symmetry. Pupils are equal round and reactive to light bilaterally.  The speech is fluent and clear.  It is hypophonic.  The patient is able to make the gutteral sounds without difficulty.  Soft palate rises symmetrically and there is no tongue deviation. Hearing is intact to conversational tone. Sensation: Sensation is intact to light throughout. Motor: Strength is 5/5 in the bilateral upper and lower extremities.   Shoulder shrug is equal and symmetric.  There is no pronator drift.  Movement examination: Tone: There is mild increased tone in the right upper extremity (and she is due for the next dosage).  The left is normal. Abnormal movements: There is a mild resting tremor in the right upper extremity. Coordination:  There is no significant decremation with rapid alternating movements today. Gait and Station: The patient has no difficulty arising out of a deep-seated chair without the use of the hands.  She was able to arise on the first attempt.  She does have decreased arm swing bilaterally, but especially on the right.  Stride length was much improved.        ASSESSMENT/PLAN:  1.  Parkinson's disease, new diagnosis, 04/20/2013.  -She looks better on the carbidopa/levodopa 25/100, one tablet 3 times per day.  She will continue on this.  -I encouraged her to exercise.   2.  Anxiety and ? Depression  -She is doing much better on Effexor, and is now off of both  benzodiazepines, including the Valium. 3.  I will plan on seeing her back in the next 4 months.

## 2013-11-26 LAB — HM MAMMOGRAPHY: HM Mammogram: NEGATIVE

## 2014-01-15 ENCOUNTER — Telehealth: Payer: Self-pay

## 2014-01-15 NOTE — Telephone Encounter (Signed)
Medication and allergies:  Reviewed and updated  90 day supply/mail order: n/a Local pharmacy:  [facility], [place]   Immunizations due:  Shingles   A/P: No changes to personal, family history or past surgical hx PAP- 12/09/11- negative CCS- 12/06/05- normal per patient; repeat in 5 years---states her book says every 10 years and so she has not gone back.   MMG- 11/26/13 -negative BD- 03/31/12- osteopenia; repeat in 3 years (03/2015) Flu- 05/24/13 Tdap- 04/10/13 PNA- 06/03/04 Shingles- declined  To Discuss with Peytin Dechert: Nothing at this time.

## 2014-01-16 ENCOUNTER — Encounter: Payer: Self-pay | Admitting: Family Medicine

## 2014-01-16 ENCOUNTER — Ambulatory Visit (INDEPENDENT_AMBULATORY_CARE_PROVIDER_SITE_OTHER): Payer: Medicare Other | Admitting: Family Medicine

## 2014-01-16 VITALS — BP 126/80 | HR 82 | Temp 98.9°F | Ht 61.0 in | Wt 151.6 lb

## 2014-01-16 DIAGNOSIS — Z0000 Encounter for general adult medical examination without abnormal findings: Secondary | ICD-10-CM

## 2014-01-16 DIAGNOSIS — Z23 Encounter for immunization: Secondary | ICD-10-CM

## 2014-01-16 DIAGNOSIS — G20 Parkinson's disease: Secondary | ICD-10-CM

## 2014-01-16 DIAGNOSIS — E785 Hyperlipidemia, unspecified: Secondary | ICD-10-CM

## 2014-01-16 LAB — CBC WITH DIFFERENTIAL/PLATELET
BASOS PCT: 0.4 % (ref 0.0–3.0)
Basophils Absolute: 0 10*3/uL (ref 0.0–0.1)
Eosinophils Absolute: 0.1 10*3/uL (ref 0.0–0.7)
Eosinophils Relative: 1.5 % (ref 0.0–5.0)
HCT: 37.5 % (ref 36.0–46.0)
HEMOGLOBIN: 12.7 g/dL (ref 12.0–15.0)
LYMPHS PCT: 30.8 % (ref 12.0–46.0)
Lymphs Abs: 1.9 10*3/uL (ref 0.7–4.0)
MCHC: 33.9 g/dL (ref 30.0–36.0)
MCV: 93.3 fl (ref 78.0–100.0)
Monocytes Absolute: 0.4 10*3/uL (ref 0.1–1.0)
Monocytes Relative: 6.6 % (ref 3.0–12.0)
NEUTROS ABS: 3.7 10*3/uL (ref 1.4–7.7)
NEUTROS PCT: 60.7 % (ref 43.0–77.0)
Platelets: 353 10*3/uL (ref 150.0–400.0)
RBC: 4.02 Mil/uL (ref 3.87–5.11)
RDW: 13.4 % (ref 11.5–15.5)
WBC: 6 10*3/uL (ref 4.0–10.5)

## 2014-01-16 LAB — BASIC METABOLIC PANEL
BUN: 18 mg/dL (ref 6–23)
CHLORIDE: 101 meq/L (ref 96–112)
CO2: 31 mEq/L (ref 19–32)
CREATININE: 0.7 mg/dL (ref 0.4–1.2)
Calcium: 9.2 mg/dL (ref 8.4–10.5)
GFR: 81.24 mL/min (ref >60.00)
Glucose, Bld: 103 mg/dL — ABNORMAL HIGH (ref 70–99)
Potassium: 3.6 mEq/L (ref 3.5–5.1)
SODIUM: 139 meq/L (ref 135–145)

## 2014-01-16 LAB — POCT URINALYSIS DIPSTICK
BILIRUBIN UA: NEGATIVE
Blood, UA: NEGATIVE
GLUCOSE UA: NEGATIVE
KETONES UA: NEGATIVE
Leukocytes, UA: NEGATIVE
Nitrite, UA: NEGATIVE
Protein, UA: NEGATIVE
Urobilinogen, UA: 0.2
pH, UA: 7.5

## 2014-01-16 LAB — HEPATIC FUNCTION PANEL
ALK PHOS: 85 U/L (ref 39–117)
ALT: 14 U/L (ref 0–35)
AST: 22 U/L (ref 0–37)
Albumin: 4.5 g/dL (ref 3.5–5.2)
BILIRUBIN DIRECT: 0.2 mg/dL (ref 0.0–0.3)
TOTAL PROTEIN: 7.7 g/dL (ref 6.0–8.3)
Total Bilirubin: 0.8 mg/dL (ref 0.2–1.2)

## 2014-01-16 LAB — MICROALBUMIN / CREATININE URINE RATIO
CREATININE, U: 35.6 mg/dL
MICROALB/CREAT RATIO: 0.6 mg/g (ref 0.0–30.0)
Microalb, Ur: 0.2 mg/dL (ref 0.0–1.9)

## 2014-01-16 LAB — LIPID PANEL
CHOL/HDL RATIO: 3
Cholesterol: 205 mg/dL — ABNORMAL HIGH (ref 0–200)
HDL: 61.7 mg/dL (ref >39.00)
LDL Cholesterol: 130 mg/dL — ABNORMAL HIGH (ref 0–99)
NONHDL: 143.3
Triglycerides: 66 mg/dL (ref 0.0–149.0)
VLDL: 13.2 mg/dL (ref 0.0–40.0)

## 2014-01-16 MED ORDER — VENLAFAXINE HCL ER 75 MG PO CP24: ORAL_CAPSULE | ORAL | Status: DC

## 2014-01-16 NOTE — Assessment & Plan Note (Signed)
Per neuro 

## 2014-01-16 NOTE — Progress Notes (Signed)
Pre visit review using our clinic review tool, if applicable. No additional management support is needed unless otherwise documented below in the visit note. 

## 2014-01-16 NOTE — Patient Instructions (Signed)
Preventive Care for Adults A healthy lifestyle and preventive care can promote health and wellness. Preventive health guidelines for women include the following key practices.  A routine yearly physical is a good way to check with your health care provider about your health and preventive screening. It is a chance to share any concerns and updates on your health and to receive a thorough exam.  Visit your dentist for a routine exam and preventive care every 6 months. Brush your teeth twice a day and floss once a day. Good oral hygiene prevents tooth decay and gum disease.  The frequency of eye exams is based on your age, health, family medical history, use of contact lenses, and other factors. Follow your health care provider's recommendations for frequency of eye exams.  Eat a healthy diet. Foods like vegetables, fruits, whole grains, low-fat dairy products, and lean protein foods contain the nutrients you need without too many calories. Decrease your intake of foods high in solid fats, added sugars, and salt. Eat the right amount of calories for you.Get information about a proper diet from your health care provider, if necessary.  Regular physical exercise is one of the most important things you can do for your health. Most adults should get at least 150 minutes of moderate-intensity exercise (any activity that increases your heart rate and causes you to sweat) each week. In addition, most adults need muscle-strengthening exercises on 2 or more days a week.  Maintain a healthy weight. The body mass index (BMI) is a screening tool to identify possible weight problems. It provides an estimate of body fat based on height and weight. Your health care provider can find your BMI, and can help you achieve or maintain a healthy weight.For adults 20 years and older:  A BMI below 18.5 is considered underweight.  A BMI of 18.5 to 24.9 is normal.  A BMI of 25 to 29.9 is considered overweight.  A BMI of  30 and above is considered obese.  Maintain normal blood lipids and cholesterol levels by exercising and minimizing your intake of saturated fat. Eat a balanced diet with plenty of fruit and vegetables. Blood tests for lipids and cholesterol should begin at age 52 and be repeated every 5 years. If your lipid or cholesterol levels are high, you are over 50, or you are at high risk for heart disease, you may need your cholesterol levels checked more frequently.Ongoing high lipid and cholesterol levels should be treated with medicines if diet and exercise are not working.  If you smoke, find out from your health care provider how to quit. If you do not use tobacco, do not start.  Lung cancer screening is recommended for adults aged 37-80 years who are at high risk for developing lung cancer because of a history of smoking. A yearly low-dose CT scan of the lungs is recommended for people who have at least a 30-pack-year history of smoking and are a current smoker or have quit within the past 15 years. A pack year of smoking is smoking an average of 1 pack of cigarettes a day for 1 year (for example: 1 pack a day for 30 years or 2 packs a day for 15 years). Yearly screening should continue until the smoker has stopped smoking for at least 15 years. Yearly screening should be stopped for people who develop a health problem that would prevent them from having lung cancer treatment.  If you are pregnant, do not drink alcohol. If you are breastfeeding,  be very cautious about drinking alcohol. If you are not pregnant and choose to drink alcohol, do not have more than 1 drink per day. One drink is considered to be 12 ounces (355 mL) of beer, 5 ounces (148 mL) of wine, or 1.5 ounces (44 mL) of liquor.  Avoid use of street drugs. Do not share needles with anyone. Ask for help if you need support or instructions about stopping the use of drugs.  High blood pressure causes heart disease and increases the risk of  stroke. Your blood pressure should be checked at least every 1 to 2 years. Ongoing high blood pressure should be treated with medicines if weight loss and exercise do not work.  If you are 75-52 years old, ask your health care provider if you should take aspirin to prevent strokes.  Diabetes screening involves taking a blood sample to check your fasting blood sugar level. This should be done once every 3 years, after age 15, if you are within normal weight and without risk factors for diabetes. Testing should be considered at a younger age or be carried out more frequently if you are overweight and have at least 1 risk factor for diabetes.  Breast cancer screening is essential preventive care for women. You should practice "breast self-awareness." This means understanding the normal appearance and feel of your breasts and may include breast self-examination. Any changes detected, no matter how small, should be reported to a health care provider. Women in their 58s and 30s should have a clinical breast exam (CBE) by a health care provider as part of a regular health exam every 1 to 3 years. After age 16, women should have a CBE every year. Starting at age 53, women should consider having a mammogram (breast X-ray test) every year. Women who have a family history of breast cancer should talk to their health care provider about genetic screening. Women at a high risk of breast cancer should talk to their health care providers about having an MRI and a mammogram every year.  Breast cancer gene (BRCA)-related cancer risk assessment is recommended for women who have family members with BRCA-related cancers. BRCA-related cancers include breast, ovarian, tubal, and peritoneal cancers. Having family members with these cancers may be associated with an increased risk for harmful changes (mutations) in the breast cancer genes BRCA1 and BRCA2. Results of the assessment will determine the need for genetic counseling and  BRCA1 and BRCA2 testing.  Routine pelvic exams to screen for cancer are no longer recommended for nonpregnant women who are considered low risk for cancer of the pelvic organs (ovaries, uterus, and vagina) and who do not have symptoms. Ask your health care provider if a screening pelvic exam is right for you.  If you have had past treatment for cervical cancer or a condition that could lead to cancer, you need Pap tests and screening for cancer for at least 20 years after your treatment. If Pap tests have been discontinued, your risk factors (such as having a new sexual partner) need to be reassessed to determine if screening should be resumed. Some women have medical problems that increase the chance of getting cervical cancer. In these cases, your health care provider may recommend more frequent screening and Pap tests.  The HPV test is an additional test that may be used for cervical cancer screening. The HPV test looks for the virus that can cause the cell changes on the cervix. The cells collected during the Pap test can be  tested for HPV. The HPV test could be used to screen women aged 47 years and older, and should be used in women of any age who have unclear Pap test results. After the age of 36, women should have HPV testing at the same frequency as a Pap test.  Colorectal cancer can be detected and often prevented. Most routine colorectal cancer screening begins at the age of 38 years and continues through age 58 years. However, your health care provider may recommend screening at an earlier age if you have risk factors for colon cancer. On a yearly basis, your health care provider may provide home test kits to check for hidden blood in the stool. Use of a small camera at the end of a tube, to directly examine the colon (sigmoidoscopy or colonoscopy), can detect the earliest forms of colorectal cancer. Talk to your health care provider about this at age 64, when routine screening begins. Direct  exam of the colon should be repeated every 5-10 years through age 21 years, unless early forms of pre-cancerous polyps or small growths are found.  People who are at an increased risk for hepatitis B should be screened for this virus. You are considered at high risk for hepatitis B if:  You were born in a country where hepatitis B occurs often. Talk with your health care provider about which countries are considered high risk.  Your parents were born in a high-risk country and you have not received a shot to protect against hepatitis B (hepatitis B vaccine).  You have HIV or AIDS.  You use needles to inject street drugs.  You live with, or have sex with, someone who has Hepatitis B.  You get hemodialysis treatment.  You take certain medicines for conditions like cancer, organ transplantation, and autoimmune conditions.  Hepatitis C blood testing is recommended for all people born from [birth year] through 1965 and any individual with known risks for hepatitis C.  Practice safe sex. Use condoms and avoid high-risk sexual practices to reduce the spread of sexually transmitted infections (STIs). STIs include gonorrhea, chlamydia, syphilis, trichomonas, herpes, HPV, and human immunodeficiency virus (HIV). Herpes, HIV, and HPV are viral illnesses that have no cure. They can result in disability, cancer, and death.  You should be screened for sexually transmitted illnesses (STIs) including gonorrhea and chlamydia if:  You are sexually active and are younger than 24 years.  You are older than 24 years and your health care provider tells you that you are at risk for this type of infection.  Your sexual activity has changed since you were last screened and you are at an increased risk for chlamydia or gonorrhea. Ask your health care provider if you are at risk.  If you are at risk of being infected with HIV, it is recommended that you take a prescription medicine daily to prevent HIV infection. This is  called preexposure prophylaxis (PrEP). You are considered at risk if:  You are a heterosexual woman, are sexually active, and are at increased risk for HIV infection.  You take drugs by injection.  You are sexually active with a partner who has HIV.  Talk with your health care provider about whether you are at high risk of being infected with HIV. If you choose to begin PrEP, you should first be tested for HIV. You should then be tested every 3 months for as long as you are taking PrEP.  Osteoporosis is a disease in which the bones lose minerals and strength  with aging. This can result in serious bone fractures or breaks. The risk of osteoporosis can be identified using a bone density scan. Women ages 65 years and over and women at risk for fractures or osteoporosis should discuss screening with their health care providers. Ask your health care provider whether you should take a calcium supplement or vitamin D to reduce the rate of osteoporosis.  Menopause can be associated with physical symptoms and risks. Hormone replacement therapy is available to decrease symptoms and risks. You should talk to your health care provider about whether hormone replacement therapy is right for you.  Use sunscreen. Apply sunscreen liberally and repeatedly throughout the day. You should seek shade when your shadow is shorter than you. Protect yourself by wearing long sleeves, pants, a wide-brimmed hat, and sunglasses year round, whenever you are outdoors.  Once a month, do a whole body skin exam, using a mirror to look at the skin on your back. Tell your health care provider of new moles, moles that have irregular borders, moles that are larger than a pencil eraser, or moles that have changed in shape or color.  Stay current with required vaccines (immunizations).  Influenza vaccine. All adults should be immunized every year.  Tetanus, diphtheria, and acellular pertussis (Td, Tdap) vaccine. Pregnant women should  receive 1 dose of Tdap vaccine during each pregnancy. The dose should be obtained regardless of the length of time since the last dose. Immunization is preferred during the 27th-36th week of gestation. An adult who has not previously received Tdap or who does not know her vaccine status should receive 1 dose of Tdap. This initial dose should be followed by tetanus and diphtheria toxoids (Td) booster doses every 10 years. Adults with an unknown or incomplete history of completing a 3-dose immunization series with Td-containing vaccines should begin or complete a primary immunization series including a Tdap dose. Adults should receive a Td booster every 10 years.  Varicella vaccine. An adult without evidence of immunity to varicella should receive 2 doses or a second dose if she has previously received 1 dose. Pregnant females who do not have evidence of immunity should receive the first dose after pregnancy. This first dose should be obtained before leaving the health care facility. The second dose should be obtained 4-8 weeks after the first dose.  Human papillomavirus (HPV) vaccine. Females aged 13-26 years who have not received the vaccine previously should obtain the 3-dose series. The vaccine is not recommended for use in pregnant females. However, pregnancy testing is not needed before receiving a dose. If a female is found to be pregnant after receiving a dose, no treatment is needed. In that case, the remaining doses should be delayed until after the pregnancy. Immunization is recommended for any person with an immunocompromised condition through the age of 26 years if she did not get any or all doses earlier. During the 3-dose series, the second dose should be obtained 4-8 weeks after the first dose. The third dose should be obtained 24 weeks after the first dose and 16 weeks after the second dose.  Zoster vaccine. One dose is recommended for adults aged 60 years or older unless certain conditions are  present.  Measles, mumps, and rubella (MMR) vaccine. Adults born before 1957 generally are considered immune to measles and mumps. Adults born in 1957 or later should have 1 or more doses of MMR vaccine unless there is a contraindication to the vaccine or there is laboratory evidence of immunity to   each of the three diseases. A routine second dose of MMR vaccine should be obtained at least 28 days after the first dose for students attending postsecondary schools, health care workers, or international travelers. People who received inactivated measles vaccine or an unknown type of measles vaccine during 1963-1967 should receive 2 doses of MMR vaccine. People who received inactivated mumps vaccine or an unknown type of mumps vaccine before 1979 and are at high risk for mumps infection should consider immunization with 2 doses of MMR vaccine. For females of childbearing age, rubella immunity should be determined. If there is no evidence of immunity, females who are not pregnant should be vaccinated. If there is no evidence of immunity, females who are pregnant should delay immunization until after pregnancy. Unvaccinated health care workers born before 1957 who lack laboratory evidence of measles, mumps, or rubella immunity or laboratory confirmation of disease should consider measles and mumps immunization with 2 doses of MMR vaccine or rubella immunization with 1 dose of MMR vaccine.  Pneumococcal 13-valent conjugate (PCV13) vaccine. When indicated, a person who is uncertain of her immunization history and has no record of immunization should receive the PCV13 vaccine. An adult aged 19 years or older who has certain medical conditions and has not been previously immunized should receive 1 dose of PCV13 vaccine. This PCV13 should be followed with a dose of pneumococcal polysaccharide (PPSV23) vaccine. The PPSV23 vaccine dose should be obtained at least 8 weeks after the dose of PCV13 vaccine. An adult aged 19  years or older who has certain medical conditions and previously received 1 or more doses of PPSV23 vaccine should receive 1 dose of PCV13. The PCV13 vaccine dose should be obtained 1 or more years after the last PPSV23 vaccine dose.  Pneumococcal polysaccharide (PPSV23) vaccine. When PCV13 is also indicated, PCV13 should be obtained first. All adults aged 65 years and older should be immunized. An adult younger than age 65 years who has certain medical conditions should be immunized. Any person who resides in a nursing home or long-term care facility should be immunized. An adult smoker should be immunized. People with an immunocompromised condition and certain other conditions should receive both PCV13 and PPSV23 vaccines. People with human immunodeficiency virus (HIV) infection should be immunized as soon as possible after diagnosis. Immunization during chemotherapy or radiation therapy should be avoided. Routine use of PPSV23 vaccine is not recommended for American Indians, [place] Natives, or people younger than 65 years unless there are medical conditions that require PPSV23 vaccine. When indicated, people who have unknown immunization and have no record of immunization should receive PPSV23 vaccine. One-time revaccination 5 years after the first dose of PPSV23 is recommended for people aged 19-64 years who have chronic kidney failure, nephrotic syndrome, asplenia, or immunocompromised conditions. People who received 1-2 doses of PPSV23 before age 65 years should receive another dose of PPSV23 vaccine at age 65 years or later if at least 5 years have passed since the previous dose. Doses of PPSV23 are not needed for people immunized with PPSV23 at or after age 65 years.  Meningococcal vaccine. Adults with asplenia or persistent complement component deficiencies should receive 2 doses of quadrivalent meningococcal conjugate (MenACWY-D) vaccine. The doses should be obtained at least 2 months apart.  Microbiologists working with certain meningococcal bacteria, military recruits, people at risk during an outbreak, and people who travel to or live in countries with a high rate of meningitis should be immunized. A first-year college student up through age   21 years who is living in a residence hall should receive a dose if she did not receive a dose on or after her 16th birthday. Adults who have certain high-risk conditions should receive one or more doses of vaccine.  Hepatitis A vaccine. Adults who wish to be protected from this disease, have certain high-risk conditions, work with hepatitis A-infected animals, work in hepatitis A research labs, or travel to or work in countries with a high rate of hepatitis A should be immunized. Adults who were previously unvaccinated and who anticipate close contact with an international adoptee during the first 60 days after arrival in the [place] from a country with a high rate of hepatitis A should be immunized.  Hepatitis B vaccine. Adults who wish to be protected from this disease, have certain high-risk conditions, may be exposed to blood or other infectious body fluids, are household contacts or sex partners of hepatitis B positive people, are clients or workers in certain care facilities, or travel to or work in countries with a high rate of hepatitis B should be immunized.  Haemophilus influenzae type b (Hib) vaccine. A previously unvaccinated person with asplenia or sickle cell disease or having a scheduled splenectomy should receive 1 dose of Hib vaccine. Regardless of previous immunization, a recipient of a hematopoietic stem cell transplant should receive a 3-dose series 6-12 months after her successful transplant. Hib vaccine is not recommended for adults with HIV infection. Preventive Services / Frequency Ages 43 to 39years  Blood pressure check.** / Every 1 to 2 years.  Lipid and cholesterol check.** / Every 5 years beginning at age  75.  Clinical breast exam.** / Every 3 years for women in their 32s and 74s.  BRCA-related cancer risk assessment.** / For women who have family members with a BRCA-related cancer (breast, ovarian, tubal, or peritoneal cancers).  Pap test.** / Every 2 years from ages 65 through 91. Every 3 years starting at age 34 through age 93 or 72 with a history of 3 consecutive normal Pap tests.  HPV screening.** / Every 3 years from ages 46 through ages 53 to 26 with a history of 3 consecutive normal Pap tests.  Hepatitis C blood test.** / For any individual with known risks for hepatitis C.  Skin self-exam. / Monthly.  Influenza vaccine. / Every year.  Tetanus, diphtheria, and acellular pertussis (Tdap, Td) vaccine.** / Consult your health care provider. Pregnant women should receive 1 dose of Tdap vaccine during each pregnancy. 1 dose of Td every 10 years.  Varicella vaccine.** / Consult your health care provider. Pregnant females who do not have evidence of immunity should receive the first dose after pregnancy.  HPV vaccine. / 3 doses over 6 months, if 70 and younger. The vaccine is not recommended for use in pregnant females. However, pregnancy testing is not needed before receiving a dose.  Measles, mumps, rubella (MMR) vaccine.** / You need at least 1 dose of MMR if you were born in 1957 or later. You may also need a 2nd dose. For females of childbearing age, rubella immunity should be determined. If there is no evidence of immunity, females who are not pregnant should be vaccinated. If there is no evidence of immunity, females who are pregnant should delay immunization until after pregnancy.  Pneumococcal 13-valent conjugate (PCV13) vaccine.** / Consult your health care provider.  Pneumococcal polysaccharide (PPSV23) vaccine.** / 1 to 2 doses if you smoke cigarettes or if you have certain conditions.  Meningococcal vaccine.** /  1 dose if you are age 70 to 51 years and a [place] living in a residence hall, or have one of several medical conditions, you need to get vaccinated against meningococcal disease. You may also need additional booster doses.  Hepatitis A vaccine.** / Consult your health care provider.  Hepatitis B vaccine.** / Consult your health care provider.  Haemophilus influenzae type b (Hib) vaccine.** / Consult your health care provider. Ages 40 to 64years  Blood pressure check.** / Every 1 to 2 years.  Lipid and cholesterol check.** / Every 5 years beginning at age 58 years.  Lung cancer screening. / Every year if you are aged 56-80 years and have a 30-pack-year history of smoking and currently smoke or have quit within the past 15 years. Yearly screening is stopped once you have quit smoking for at least 15 years or develop a health problem that would prevent you from having lung cancer treatment.  Clinical breast exam.** / Every year after age 35 years.  BRCA-related cancer risk assessment.** / For women who have family members with a BRCA-related cancer (breast, ovarian, tubal, or peritoneal cancers).  Mammogram.** / Every year beginning at age 109 years and continuing for as long as you are in good health. Consult with your health care provider.  Pap test.** / Every 3 years starting at age 44 years through age 94 or 70 years with a history of 3 consecutive normal Pap tests.  HPV screening.** / Every 3 years from ages 109 years through ages 50 to 30 years with a history of 3 consecutive normal Pap tests.  Fecal occult blood test (FOBT) of stool. / Every year beginning at age 73 years and continuing until age 59 years. You may not need to do this test if you get a colonoscopy every 10 years.  Flexible sigmoidoscopy or colonoscopy.** / Every 5 years for a flexible sigmoidoscopy or every 10 years for a colonoscopy beginning at age 68 years and continuing until age 12 years.  Hepatitis C blood test.** / For all people born from [birth year] through  1965 and any individual with known risks for hepatitis C.  Skin self-exam. / Monthly.  Influenza vaccine. / Every year.  Tetanus, diphtheria, and acellular pertussis (Tdap/Td) vaccine.** / Consult your health care provider. Pregnant women should receive 1 dose of Tdap vaccine during each pregnancy. 1 dose of Td every 10 years.  Varicella vaccine.** / Consult your health care provider. Pregnant females who do not have evidence of immunity should receive the first dose after pregnancy.  Zoster vaccine.** / 1 dose for adults aged 2 years or older.  Measles, mumps, rubella (MMR) vaccine.** / You need at least 1 dose of MMR if you were born in 1957 or later. You may also need a 2nd dose. For females of childbearing age, rubella immunity should be determined. If there is no evidence of immunity, females who are not pregnant should be vaccinated. If there is no evidence of immunity, females who are pregnant should delay immunization until after pregnancy.  Pneumococcal 13-valent conjugate (PCV13) vaccine.** / Consult your health care provider.  Pneumococcal polysaccharide (PPSV23) vaccine.** / 1 to 2 doses if you smoke cigarettes or if you have certain conditions.  Meningococcal vaccine.** / Consult your health care provider.  Hepatitis A vaccine.** / Consult your health care provider.  Hepatitis B vaccine.** / Consult your health care provider.  Haemophilus influenzae type b (Hib) vaccine.** / Consult your health care provider. Ages 48 years  and over  Blood pressure check.** / Every 1 to 2 years.  Lipid and cholesterol check.** / Every 5 years beginning at age 84 years.  Lung cancer screening. / Every year if you are aged 50-80 years and have a 30-pack-year history of smoking and currently smoke or have quit within the past 15 years. Yearly screening is stopped once you have quit smoking for at least 15 years or develop a health problem that would prevent you from having lung cancer  treatment.  Clinical breast exam.** / Every year after age 24 years.  BRCA-related cancer risk assessment.** / For women who have family members with a BRCA-related cancer (breast, ovarian, tubal, or peritoneal cancers).  Mammogram.** / Every year beginning at age 14 years and continuing for as long as you are in good health. Consult with your health care provider.  Pap test.** / Every 3 years starting at age 17 years through age 31 or 74 years with 3 consecutive normal Pap tests. Testing can be stopped between 65 and 70 years with 3 consecutive normal Pap tests and no abnormal Pap or HPV tests in the past 10 years.  HPV screening.** / Every 3 years from ages 30 years through ages 70 or 28 years with a history of 3 consecutive normal Pap tests. Testing can be stopped between 65 and 70 years with 3 consecutive normal Pap tests and no abnormal Pap or HPV tests in the past 10 years.  Fecal occult blood test (FOBT) of stool. / Every year beginning at age 64 years and continuing until age 92 years. You may not need to do this test if you get a colonoscopy every 10 years.  Flexible sigmoidoscopy or colonoscopy.** / Every 5 years for a flexible sigmoidoscopy or every 10 years for a colonoscopy beginning at age 73 years and continuing until age 39 years.  Hepatitis C blood test.** / For all people born from [birth year] through 1967 and any individual with known risks for hepatitis C.  Osteoporosis screening.** / A one-time screening for women ages 35 years and over and women at risk for fractures or osteoporosis.  Skin self-exam. / Monthly.  Influenza vaccine. / Every year.  Tetanus, diphtheria, and acellular pertussis (Tdap/Td) vaccine.** / 1 dose of Td every 10 years.  Varicella vaccine.** / Consult your health care provider.  Zoster vaccine.** / 1 dose for adults aged 59 years or older.  Pneumococcal 13-valent conjugate (PCV13) vaccine.** / Consult your health care provider.  Pneumococcal  polysaccharide (PPSV23) vaccine.** / 1 dose for all adults aged 8 years and older.  Meningococcal vaccine.** / Consult your health care provider.  Hepatitis A vaccine.** / Consult your health care provider.  Hepatitis B vaccine.** / Consult your health care provider.  Haemophilus influenzae type b (Hib) vaccine.** / Consult your health care provider. ** Family history and personal history of risk and conditions may change your health care provider's recommendations. Document Released: 08/31/2001 Document Revised: 07/10/2013 Document Reviewed: 11/30/2010 [facility] Patient Information 2015 [place], [place]. This information is not intended to replace advice given to you by your health care provider. Make sure you discuss any questions you have with your health care provider.
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
## 2014-01-16 NOTE — Addendum Note (Signed)
Addended by: Ewing Schlein on: 01/16/2014 11:17 AM   Modules accepted: Orders

## 2014-01-30 ENCOUNTER — Other Ambulatory Visit: Payer: Self-pay | Admitting: Neurology

## 2014-01-30 NOTE — Telephone Encounter (Signed)
Carbidopa Levodopa refill requested. Per last office note- patient to remain on medication. Refill approved and sent to patient's pharmacy.   

## 2014-01-31 ENCOUNTER — Other Ambulatory Visit: Payer: Self-pay | Admitting: Family Medicine

## 2014-03-12 ENCOUNTER — Ambulatory Visit: Payer: Medicare Other | Admitting: Neurology

## 2014-04-23 ENCOUNTER — Ambulatory Visit: Payer: Medicare Other

## 2014-04-29 ENCOUNTER — Ambulatory Visit (INDEPENDENT_AMBULATORY_CARE_PROVIDER_SITE_OTHER): Payer: Medicare Other

## 2014-04-29 DIAGNOSIS — Z23 Encounter for immunization: Secondary | ICD-10-CM

## 2014-05-01 ENCOUNTER — Ambulatory Visit: Payer: Medicare Other

## 2014-05-06 ENCOUNTER — Encounter: Payer: Self-pay | Admitting: Neurology

## 2014-05-06 ENCOUNTER — Ambulatory Visit (INDEPENDENT_AMBULATORY_CARE_PROVIDER_SITE_OTHER): Payer: Medicare Other | Admitting: Neurology

## 2014-05-06 VITALS — BP 140/78 | HR 74 | Temp 98.4°F | Resp 16 | Wt 153.8 lb

## 2014-05-06 DIAGNOSIS — F411 Generalized anxiety disorder: Secondary | ICD-10-CM

## 2014-05-06 DIAGNOSIS — G20 Parkinson's disease: Secondary | ICD-10-CM

## 2014-05-06 NOTE — Progress Notes (Signed)
Elizabeth Mccullough was seen today in the movement disorders clinic for neurologic consultation at the request of [name], DO.  The consultation is for the evaluation of tremor.  Prior medical records were reviewed.  She is accompanied by her friend who supplements the hx.  The pt noted that she began to have tremor about a year ago.  It has increased over the year.  I noted in med records that the pt went to the ED on 04/10/13 after a fall.  Her sandal had caught on the sidewalk and she had to have stitches in her finger.  07/05/13 update:  The patient is following up today regarding her Parkinson's disease, which was just diagnosed last visit, in October 2014.  I started her on levodopa and referred her to the Parkinson's program.  The pt reports that she is taking OTC sleep medication which is helping.  She initially had nausea with the carbidopa/levodopa but is doing much better.  She really isn't sure if the nausea was from the carbidopa/levodopa 25/100 or from the antidepressant she had started but she feels good now.  She does state that she fell once since last visit.  She says that she just "wasn't looking down" and tripped walking around a car door. She insists that she is doing markedly better.  She started driving again.  "I feel almost normal."  She was supposed to go to physical therapy since our last visit, but admits that she had the initial evaluation and 2 subsequent visits, but this was when she was very nauseated and just could not tolerate the therapy.  She really is not doing any outside exercise. I did review records from other physicians since her last visit.  She had a lower extremity venous Doppler that did not demonstrate DVT but did demonstrate a rather extensive superficial thrombus that extended from above the knee to almost the ankle.  11/06/13 update:  Pt is on carbidopa/levodopa 25/100 three times per day.  She is feeling better.  No hallucinations.  No falls.  "I can  walk, I can write, my mood is good."  She is tolerating the effexor well.  She is not exercising.    05/06/14 update:  Pt is f/u today re: PD. She is on carbidopa/levodopa 25/100 tid (7am/12pm/5pm).  She is on effexor for her mood.  She is feeling well.  She reports a few falls since last visit.  One was at her sons house.  She was going up the stairs and was carrying food and fell on her knees.  She had one other minor fall.  No hallucinations.  No lightheadedness or near syncope.  She is not exercising.  She notes that she has some trouble raising the R arm.     Neuroimaging has previously been performed.  It is available for my review today.  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  The formal report is below.   *RADIOLOGY REPORT*  Clinical Data: Syncope, tremors, headaches and confusion, with  right-sided numbness and weakness. Depression.  MRI HEAD WITHOUT CONTRAST  Technique: Multiplanar, multiecho pulse sequences of the brain and  surrounding structures were obtained according to standard protocol  without intravenous contrast.  Comparison: 02/28/2012.  Findings: There is no evidence for acute infarction, intracranial  hemorrhage, mass lesion, hydrocephalus, or extra-axial fluid.  Moderate atrophy. Prominent perivascular spaces. Moderate chronic  microvascular ischemic change affects the periventricular and  subcortical white matter. Partial empty sella. No worrisome  osseous findings. Flow voids are maintained. No visible orbital,  sinus, or mastoid pathology.  Compared with priors, a similar appearance was noted.  PREVIOUS MEDICATIONS: none to date  ALLERGIES:   Allergies  Allergen Reactions  . Penicillins Hives  . Iohexol Rash     Code: RASH, Desc: PATIENT STATES SHE IS ALLERGIC TO IV DYE. 20 YRS AGO SHE HAD A REACTION AT [facility], PT WAS GIVEN BENADRYL. 07/13/06/RM, Onset Date: [id number]      CURRENT MEDICATIONS:  Current Outpatient Prescriptions on File Prior to Visit  Medication Sig Dispense Refill  . aspirin 325 MG EC tablet Take 81 mg by mouth 2 (two) times daily.       . carbidopa-levodopa (SINEMET IR) 25-100 MG per tablet TAKE 1 TABLET BY MOUTH 3 TIMES A DAY.  90 tablet  4  . diphenhydrAMINE (BENADRYL) 25 MG tablet Take 25 mg by mouth at bedtime.      Marland Kitchen ibuprofen (ADVIL) 200 MG tablet Take 2 tablets (400 mg total) by mouth every 6 (six) hours as needed for pain.  30 tablet  0  . lovastatin (MEVACOR) 20 MG tablet Take 20 mg by mouth at bedtime.      . lovastatin (MEVACOR) 20 MG tablet TAKE TWO TABLETS BY MOUTH AT BEDTIME  180 tablet  1  . magnesium hydroxide (PHILLIPS CHEWS) 311 MG CHEW Chew 311 mg by mouth every 4 (four) hours as needed.      . venlafaxine XR (EFFEXOR-XR) 75 MG 24 hr capsule TAKE 1 CAPSULE BY MOUTH DAILY.  90 capsule  3   No current facility-administered medications on file prior to visit.    PAST MEDICAL HISTORY:   Past Medical History  Diagnosis Date  . Hyperlipidemia   . AAA (abdominal aortic aneurysm)     PAST SURGICAL HISTORY:   Past Surgical History  Procedure Laterality Date  . Hammer toe surgery  04/10/02    Left Toe  . Breast cyst aspiration  1965    Right Breast  . Nasal septum surgery  1980    SOCIAL HISTORY:   History   Social History  . Marital Status: Widowed    Spouse Name: N/A    Number of Children: N/A  . Years of Education: N/A   Occupational History  . retired    Social History Main Topics  . Smoking status: Never Smoker   . Smokeless tobacco: Never Used  . Alcohol Use: Yes     Comment: social wine   . Drug Use: No  . Sexual Activity: Not Currently    Partners: Male   Other Topics Concern  . Not on file   Social History Narrative   Exercise-- no    FAMILY HISTORY:   Family Status  Relation Status Death Age  . Mother Deceased 39    chf  . Father Deceased 66    addisons  . Sister Alive     40   . Brother Deceased birth  . Sister Alive     17  . Sister Deceased   . Sister Deceased 59  . Brother Deceased   . Brother Deceased 39    bleeding ulcer, heart  . Brother Deceased 2    pneumonia    ROS:  A complete 10 system review of systems was obtained and was unremarkable apart from what is mentioned above.  PHYSICAL EXAMINATION:    VITALS:   Filed Vitals:  05/06/14 1020  BP: 140/78  Pulse: 74  Temp: 98.4 F (36.9 C)  Resp: 16  Weight: 153 lb 12.8 oz (69.763 kg)  SpO2: 97%   Wt Readings from Last 3 Encounters:  05/06/14 153 lb 12.8 oz (69.763 kg)  01/16/14 151 lb 9.6 oz (68.765 kg)  11/06/13 151 lb (68.493 kg)    GEN:  The patient appears stated age and is in NAD. HEENT:  Normocephalic, atraumatic.  The mucous membranes are moist. The superficial temporal arteries are without ropiness or tenderness. CV:  RRR Lungs:  CTAB Neck/HEME:  There are no carotid bruits bilaterally.  Neurological examination:  Orientation: The patient is alert and oriented x3. Fund of knowledge is appropriate.  Recent and remote memory are intact.  Attention and concentration are normal.    Able to name objects and repeat phrases. Cranial nerves: There is good facial symmetry.  The speech is fluent and clear.  It is hypophonic.  The patient is able to make the gutteral sounds without difficulty.  Soft palate rises symmetrically and there is no tongue deviation. Hearing is intact to conversational tone. Sensation: Sensation is intact to light throughout. Motor: Strength is 5/5 in the bilateral upper and lower extremities.   Shoulder shrug is equal and symmetric.  There is no pronator drift.  Movement examination: Tone: There is mild increased tone in the right upper extremity (and she is due for the next dosage).  The left is normal. Abnormal movements: There is a mild intermittent resting tremor in the right upper extremity. Coordination:  There is no significant decremation with rapid  alternating movements today. Gait and Station: The patient has no difficulty arising out of a deep-seated chair without the use of the hands.  She was able to arise on the first attempt.  She does have decreased arm swing bilaterally, but especially on the right.  Stride length was much improved.        ASSESSMENT/PLAN:  1.  Parkinson's disease, new diagnosis, 04/20/2013.  -She will remain on carbidopa/levodopa 25/100, one tablet 3 times per day.  She will continue on this.  -I encouraged herm again to exercise.  Greater than 50% of 25 min visit in counseling. 2.  Anxiety and ? Depression  -She is doing much better on Effexor, and is now off of both benzodiazepines, including the Valium. 3. She asks if she can just f/u yearly.  Told her that is probably just too far between visits so we agreed to keep it at every 6 months.

## 2014-05-20 ENCOUNTER — Encounter: Payer: Self-pay | Admitting: Internal Medicine

## 2014-07-18 ENCOUNTER — Other Ambulatory Visit: Payer: Self-pay | Admitting: Neurology

## 2014-07-18 NOTE — Telephone Encounter (Signed)
Levodopa refill requested. Per last office note- patient to remain on medication. Refill approved and sent to patient's pharmacy.

## 2014-10-10 ENCOUNTER — Ambulatory Visit (INDEPENDENT_AMBULATORY_CARE_PROVIDER_SITE_OTHER): Payer: Medicare Other | Admitting: Emergency Medicine

## 2014-10-10 VITALS — BP 145/92 | HR 76 | Temp 97.9°F | Resp 18 | Ht 61.0 in | Wt 150.0 lb

## 2014-10-10 DIAGNOSIS — L03032 Cellulitis of left toe: Secondary | ICD-10-CM

## 2014-10-10 DIAGNOSIS — D229 Melanocytic nevi, unspecified: Secondary | ICD-10-CM

## 2014-10-10 MED ORDER — SULFAMETHOXAZOLE-TRIMETHOPRIM 800-160 MG PO TABS: 1.0000 | ORAL_TABLET | Freq: Two times a day (BID) | ORAL | Status: DC
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
## 2014-10-10 NOTE — Patient Instructions (Signed)

## 2014-11-04 ENCOUNTER — Ambulatory Visit: Payer: Medicare Other | Admitting: Neurology

## 2014-12-20 ENCOUNTER — Other Ambulatory Visit: Payer: Self-pay | Admitting: *Deleted

## 2014-12-20 DIAGNOSIS — I712 Thoracic aortic aneurysm, without rupture, unspecified: Secondary | ICD-10-CM

## 2014-12-30 ENCOUNTER — Telehealth: Payer: Self-pay | Admitting: Family Medicine

## 2014-12-30 NOTE — Telephone Encounter (Signed)
Pre Visit letter sent  °

## 2015-01-04 LAB — BUN: BUN: 14 mg/dL (ref 6–23)

## 2015-01-04 LAB — CREATININE, SERUM: Creat: 0.79 mg/dL (ref 0.50–1.10)

## 2015-01-08 ENCOUNTER — Encounter: Payer: Self-pay | Admitting: Cardiothoracic Surgery

## 2015-01-08 ENCOUNTER — Ambulatory Visit (INDEPENDENT_AMBULATORY_CARE_PROVIDER_SITE_OTHER): Payer: Medicare Other | Admitting: Cardiothoracic Surgery

## 2015-01-08 ENCOUNTER — Ambulatory Visit
Admission: RE | Admit: 2015-01-08 | Discharge: 2015-01-08 | Disposition: A | Discharge status: Home / Self Care | Payer: Medicare Other | Source: Ambulatory Visit | Attending: Cardiothoracic Surgery | Admitting: Cardiothoracic Surgery

## 2015-01-08 ENCOUNTER — Other Ambulatory Visit: Payer: Self-pay | Admitting: Cardiothoracic Surgery

## 2015-01-08 VITALS — BP 161/80 | HR 66 | Resp 16 | Ht 61.5 in | Wt 148.0 lb

## 2015-01-08 DIAGNOSIS — I712 Thoracic aortic aneurysm, without rupture, unspecified: Secondary | ICD-10-CM

## 2015-01-08 NOTE — Progress Notes (Signed)
PCP is [name], DO Referring Provider is Rosalita Chessman, DO  Chief Complaint  Patient presents with  . Follow-up    18 month surveillence with CT CHEST    Elizabeth Mccullough returns for a scheduled followup with a mild-moderate dilatation of the ascending aorta-fusiform aneurysm measuring 4.2 cm in stable over the past several years. She does have hypertension today at the office but states her blood pressures at home are normal.denies chest pain. Since her last visit she is planning on moving out of her house to live with a daughter. She has Parkinson's disease and anxiety issues. No shortness of breath. No ankle edema.last echocardiogram 2013 showed moderate aortic insufficiency with normal LV systolic function   Past Medical History  Diagnosis Date  . Hyperlipidemia   . AAA (abdominal aortic aneurysm)     Past Surgical History  Procedure Laterality Date  . Hammer toe surgery  04/10/02    Left Toe  . Breast cyst aspiration  1965    Right Breast  . Nasal septum surgery  1980    Family History  Problem Relation Age of Onset  . Breast cancer    . Colon cancer    . Heart disease Mother   . Hyperlipidemia Sister   . Hyperlipidemia Brother   . Diabetes Sister   . Hyperlipidemia Sister   . Cancer Sister 22    colon  . Cancer Sister     breast  . Stroke Brother   . Hypertension Brother     Social History History  Substance Use Topics  . Smoking status: Never Smoker   . Smokeless tobacco: Never Used  . Alcohol Use: Yes     Comment: social wine     Current Outpatient Prescriptions  Medication Sig Dispense Refill  . aspirin 325 MG EC tablet Take 81 mg by mouth 2 (two) times daily.     . carbidopa-levodopa (SINEMET IR) 25-100 MG per tablet TAKE 1 TABLET BY MOUTH 3 TIMES A DAY. 90 tablet 5  . diphenhydrAMINE (BENADRYL) 25 MG tablet Take 25 mg by mouth at bedtime.    Marland Kitchen ibuprofen (ADVIL) 200 MG tablet Take 2 tablets (400 mg total) by mouth every 6 (six) hours as needed for  pain. 30 tablet 0  . lovastatin (MEVACOR) 20 MG tablet Take 20 mg by mouth at bedtime.    . magnesium hydroxide (PHILLIPS CHEWS) 311 MG CHEW Chew 311 mg by mouth every 4 (four) hours as needed.    . venlafaxine XR (EFFEXOR-XR) 75 MG 24 hr capsule TAKE 1 CAPSULE BY MOUTH DAILY. 90 capsule 3   No current facility-administered medications for this visit.    Allergies  Allergen Reactions  . Penicillins Hives  . Iohexol Rash     Code: RASH, Desc: PATIENT STATES SHE IS ALLERGIC TO IV DYE. 20 YRS AGO SHE HAD A REACTION AT [facility], PT WAS GIVEN BENADRYL. 07/13/06/RM, Onset Date: [id number]     Review of Systems    Review of Systems  General:  No weight loss   no fever   no decreased energy  no night sweats Cardiac: -Chest pain with exertion-  resting chest pain   -SOB with exertion -  Orthopnea                -  PND  -ankle edema  syncope Pulmonary:  no dyspnea,no cough, no productive cough no home oxygen no hemoptysis GI: no difficulty swallowing  no GERD no jaundice  no melena  no hematemesis no        abdominal pain GU:  no dysuria  no hematuria  no frequent UTI no BPH Vascular:  no claudication  No TIA  No varicose veins no DVT Neuro:  no sroke no seizures no TIA no head trauma no vision changes Musculoskeletal:  normal mobility no arthritis  no gout  no joint swelling Skin: no rash  no skin ulceration  no skin cancer Endocrine: diabetes  no thyroid didease Hematologic: no easy bruising  no blood transfusions  no frequent epistaxis ENT : no painful teeth no dentures no loose teeth Psych : positive for anxiety-depression  no psych hospitalizations        BP 161/80 mmHg  Pulse 66  Resp 16  Ht 5' 1.5" (1.562 m)  Wt 148 lb (67.132 kg)  BMI 27.51 kg/m2  SpO2 98%  LMP 07/19/1988 Physical Exam     Physical Exam  General: alert pleasant elderly female no acute distress HEENT: Normocephalic pupils equal , dentition adequate Neck: Supple without JVD, adenopathy, or  bruit Chest: Clear to auscultation, symmetrical breath sounds, no rhonchi, no tenderness             or deformity Cardiovascular: Regular rate and rhythm, grade 2/6 diastolic murmur of aortic insufficiency , no gallop, peripheral pulses             palpable in all extremities Abdomen:  Soft, nontender, no palpable mass or organomegaly Extremities: Warm, well-perfused, no clubbing cyanosis edema or tenderness,              no venous stasis changes of the legs Rectal/GU: Deferred Neuro: Grossly non--focal and symmetrical throughout Skin: Clean and dry without rash or ulceration   Diagnostic Tests: CT scan of chest personally reviewed Ascending aorta diameter remains at 4.2-4.3 cm No evidence of ulceration or hematoma Lung fields are clear No pleural effusion  Impression: Stable fusiform ascending thoracic aortic aneurysm with diameter 4.2 cm. Surgical repair not indicated until diameter greater than 5.5 cm. Continue to follow with serial surveillance CT scans without contrast  Plan: I'm concerned she may have hypertension that has not been treated. I've asked her to have frequent blood pressure checks with her primary physician. She will return in 1.5 years with CT scan of the thoracic aorta followed the small asymptomatic fusiform ascending aneurysm. Blood pressure control would also help her aortic insufficiency.  Len Childs, MD [facility] [phone]

## 2015-01-15 ENCOUNTER — Other Ambulatory Visit: Payer: Self-pay | Admitting: Neurology

## 2015-01-15 NOTE — Telephone Encounter (Signed)
Carbidopa Levodopa refill requested. Per last office note- patient to remain on medication. Refill approved and sent to patient's pharmacy. Patient is overdue for appt. One month refill sent to pharmacy with note that patient needs appt for further refills.

## 2015-01-21 ENCOUNTER — Encounter: Payer: Medicare Other | Admitting: Family Medicine

## 2015-02-13 ENCOUNTER — Other Ambulatory Visit: Payer: Self-pay | Admitting: Family Medicine

## 2015-02-13 ENCOUNTER — Other Ambulatory Visit: Payer: Self-pay | Admitting: *Deleted

## 2015-02-13 ENCOUNTER — Other Ambulatory Visit: Payer: Self-pay | Admitting: Neurology

## 2015-02-13 MED ORDER — CARBIDOPA-LEVODOPA 25-100 MG PO TABS: 1.0000 | ORAL_TABLET | Freq: Three times a day (TID) | ORAL | Status: DC

## 2015-02-13 NOTE — Telephone Encounter (Signed)
Patent has scheduled a follow up 02/20/2015 she is requesting a refill Call back number [phone]

## 2015-02-13 NOTE — Telephone Encounter (Signed)
Refill denied. Needs an appt.

## 2015-02-13 NOTE — Telephone Encounter (Signed)
One month supply sent to pharmacy

## 2015-02-20 ENCOUNTER — Encounter: Payer: Self-pay | Admitting: Neurology

## 2015-02-20 ENCOUNTER — Ambulatory Visit (INDEPENDENT_AMBULATORY_CARE_PROVIDER_SITE_OTHER): Payer: Medicare Other | Admitting: Neurology

## 2015-02-20 VITALS — BP 106/60 | HR 76 | Ht 61.5 in | Wt 149.0 lb

## 2015-02-20 DIAGNOSIS — G20 Parkinson's disease: Secondary | ICD-10-CM | POA: Diagnosis not present

## 2015-02-20 DIAGNOSIS — F411 Generalized anxiety disorder: Secondary | ICD-10-CM

## 2015-02-20 DIAGNOSIS — G20A1 Parkinson's disease without dyskinesia, without mention of fluctuations: Secondary | ICD-10-CM

## 2015-02-20 NOTE — Progress Notes (Signed)
Elizabeth Mccullough was seen today in the movement disorders clinic for neurologic consultation at the request of [name], DO.  The consultation is for the evaluation of tremor.  Prior medical records were reviewed.  She is accompanied by her friend who supplements the hx.  The pt noted that she began to have tremor about a year ago.  It has increased over the year.  I noted in med records that the pt went to the ED on 04/10/13 after a fall.  Her sandal had caught on the sidewalk and she had to have stitches in her finger.  07/05/13 update:  The patient is following up today regarding her Parkinson's disease, which was just diagnosed last visit, in October 2014.  I started her on levodopa and referred her to the Parkinson's program.  The pt reports that she is taking OTC sleep medication which is helping.  She initially had nausea with the carbidopa/levodopa but is doing much better.  She really isn't sure if the nausea was from the carbidopa/levodopa 25/100 or from the antidepressant she had started but she feels good now.  She does state that she fell once since last visit.  She says that she just "wasn't looking down" and tripped walking around a car door. She insists that she is doing markedly better.  She started driving again.  "I feel almost normal."  She was supposed to go to physical therapy since our last visit, but admits that she had the initial evaluation and 2 subsequent visits, but this was when she was very nauseated and just could not tolerate the therapy.  She really is not doing any outside exercise. I did review records from other physicians since her last visit.  She had a lower extremity venous Doppler that did not demonstrate DVT but did demonstrate a rather extensive superficial thrombus that extended from above the knee to almost the ankle.  11/06/13 update:  Pt is on carbidopa/levodopa 25/100 three times per day.  She is feeling better.  No hallucinations.  No falls.  "I can  walk, I can write, my mood is good."  She is tolerating the effexor well.  She is not exercising.    05/06/14 update:  Pt is f/u today re: PD. She is on carbidopa/levodopa 25/100 tid (7am/12pm/5pm).  She is on effexor for her mood.  She is feeling well.  She reports a few falls since last visit.  One was at her sons house.  She was going up the stairs and was carrying food and fell on her knees.  She had one other minor fall.  No hallucinations.  No lightheadedness or near syncope.  She is not exercising.  She notes that she has some trouble raising the R arm.   02/20/15 update:  The patient is following up today.  I have not seen her since October, 2015.  She had an appointment in April that she canceled because she reported that she was moving.  She is currently on carbidopa/levodopa 25/100, one tablet 3 times per day.   She is tolerating that well.  She remains on Effexor for anxiety and depression.  I reviewed records available to me since last visit.  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  She does not need a follow-up with them for another year and a half.  She is not exercising faithfully.  She does have some tremor of the L hand if nervous or if she is trying to put  on eyebrow pencil.  Wearing off:  No.  How long before next dose:  n/a Falls:   Yes.  , last week; fell over a pillow on the floor; did not get hurt N/V:  No. Hallucinations:  No.  visual distortions: No. Lightheaded:  No.  Syncope: No. Dyskinesia:  No.  Neuroimaging has previously been performed.  It is available for my review today.  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  The formal report is below.   *RADIOLOGY REPORT*  Clinical Data: Syncope, tremors, headaches and confusion, with  right-sided numbness and weakness. Depression.  MRI HEAD WITHOUT CONTRAST  Technique: Multiplanar, multiecho pulse sequences  of the brain and  surrounding structures were obtained according to standard protocol  without intravenous contrast.  Comparison: 02/28/2012.  Findings: There is no evidence for acute infarction, intracranial  hemorrhage, mass lesion, hydrocephalus, or extra-axial fluid.  Moderate atrophy. Prominent perivascular spaces. Moderate chronic  microvascular ischemic change affects the periventricular and  subcortical white matter. Partial empty sella. No worrisome  osseous findings. Flow voids are maintained. No visible orbital,  sinus, or mastoid pathology.  Compared with priors, a similar appearance was noted.  PREVIOUS MEDICATIONS: none to date  ALLERGIES:   Allergies  Allergen Reactions  . Penicillins Hives  . Iohexol Rash     Code: RASH, Desc: PATIENT STATES SHE IS ALLERGIC TO IV DYE. 20 YRS AGO SHE HAD A REACTION AT [facility], PT WAS GIVEN BENADRYL. 07/13/06/RM, Onset Date: [id number]     CURRENT MEDICATIONS:  Current Outpatient Prescriptions on File Prior to Visit  Medication Sig Dispense Refill  . aspirin 325 MG EC tablet Take 81 mg by mouth 2 (two) times daily.     . carbidopa-levodopa (SINEMET IR) 25-100 MG per tablet Take 1 tablet by mouth 3 (three) times daily. 90 tablet 0  . ibuprofen (ADVIL) 200 MG tablet Take 2 tablets (400 mg total) by mouth every 6 (six) hours as needed for pain. 30 tablet 0  . lovastatin (MEVACOR) 20 MG tablet Take 20 mg by mouth at bedtime.    . magnesium hydroxide (PHILLIPS CHEWS) 311 MG CHEW Chew 311 mg by mouth every 4 (four) hours as needed.    . venlafaxine XR (EFFEXOR-XR) 75 MG 24 hr capsule TAKE 1 CAPSULE BY MOUTH DAILY. 90 capsule 0   No current facility-administered medications on file prior to visit.    PAST MEDICAL HISTORY:   Past Medical History  Diagnosis Date  . Hyperlipidemia   . AAA (abdominal aortic aneurysm)     PAST SURGICAL HISTORY:   Past Surgical History  Procedure Laterality Date  . Hammer toe surgery  04/10/02     Left Toe  . Breast cyst aspiration  1965    Right Breast  . Nasal septum surgery  1980    SOCIAL HISTORY:   History   Social History  . Marital Status: Widowed    Spouse Name: N/A  . Number of Children: N/A  . Years of Education: N/A   Occupational History  . retired    Social History Main Topics  . Smoking status: Never Smoker   . Smokeless tobacco: Never Used  . Alcohol Use: Yes     Comment: social wine   . Drug Use: No  . Sexual Activity:    Partners: Male   Other Topics Concern  . Not on file   Social History Narrative   Exercise--  no    FAMILY HISTORY:   Family Status  Relation Status Death Age  . Mother Deceased 51    chf  . Father Deceased 72    addisons  . Sister Alive     38  . Brother Deceased birth  . Sister Alive     29  . Sister Deceased   . Sister Deceased 30  . Brother Deceased   . Brother Deceased 80    bleeding ulcer, heart  . Brother Deceased 2    pneumonia    ROS:  A complete 10 system review of systems was obtained and was unremarkable apart from what is mentioned above.  PHYSICAL EXAMINATION:    VITALS:   Filed Vitals:   02/20/15 0804  BP: 106/60  Pulse: 76  Height: 5' 1.5" (1.562 m)  Weight: 149 lb (67.586 kg)   Wt Readings from Last 3 Encounters:  02/20/15 149 lb (67.586 kg)  01/08/15 148 lb (67.132 kg)  10/10/14 150 lb (68.04 kg)    GEN:  The patient appears stated age and is in NAD. HEENT:  Normocephalic, atraumatic.  The mucous membranes are moist. The superficial temporal arteries are without ropiness or tenderness. CV:  RRR Lungs:  CTAB Neck/HEME:  There are no carotid bruits bilaterally.  Neurological examination:  Orientation: The patient is alert and oriented x3.  Cranial nerves: There is good facial symmetry.  No significant facial hypomimia.  The speech is fluent and clear.  It is hypophonic.  The patient is able to make the gutteral sounds without difficulty.  Soft palate rises symmetrically and there  is no tongue deviation. Hearing is intact to conversational tone. Sensation: Sensation is intact to light throughout. Motor: Strength is 5/5 in the bilateral upper and lower extremities.   Shoulder shrug is equal and symmetric.  There is no pronator drift.  Movement examination: Tone: There is mild increased tone in the right upper extremity (and she is due for the next dosage).  The left is normal. Abnormal movements: There is no resting tremor even with distraction procedures. Coordination:  There is some decremation with finger taps bilaterally; all other RAMs are good. Gait and Station: The patient has no difficulty arising out of a deep-seated chair without the use of the hands.  She was able to arise on the first attempt.  She does have decreased arm swing on the right.  Negative pull test.    ASSESSMENT/PLAN:  1.  Parkinson's disease, new diagnosis, 04/20/2013.  -She will remain on carbidopa/levodopa 25/100, one tablet 3 times per day.  She will continue on this.  -I encouraged her again to exercise.  Greater than 50% of 20 min visit in counseling. 2.  Anxiety and ? Depression  -She is doing much better on Effexor, and is now off of both benzodiazepines, including the Valium. 3.  F/u in 6 months, sooner should new neurologic issues arise.

## 2015-02-20 NOTE — Patient Instructions (Signed)
It is important to add safe, cardiovascular exercise (stationary bike) to your daily regimen.  Follow up in 6 months

## 2015-02-26 ENCOUNTER — Other Ambulatory Visit: Payer: Self-pay | Admitting: Family Medicine

## 2015-03-03 ENCOUNTER — Telehealth: Payer: Self-pay

## 2015-03-03 ENCOUNTER — Encounter: Payer: Self-pay | Admitting: Family Medicine

## 2015-03-03 ENCOUNTER — Ambulatory Visit (INDEPENDENT_AMBULATORY_CARE_PROVIDER_SITE_OTHER): Payer: Medicare Other | Admitting: Family Medicine

## 2015-03-03 VITALS — BP 120/84 | HR 75 | Temp 98.6°F | Ht 62.0 in | Wt 150.2 lb

## 2015-03-03 DIAGNOSIS — E785 Hyperlipidemia, unspecified: Secondary | ICD-10-CM | POA: Diagnosis not present

## 2015-03-03 DIAGNOSIS — B029 Zoster without complications: Secondary | ICD-10-CM | POA: Diagnosis not present

## 2015-03-03 DIAGNOSIS — Z0000 Encounter for general adult medical examination without abnormal findings: Secondary | ICD-10-CM

## 2015-03-03 DIAGNOSIS — F411 Generalized anxiety disorder: Secondary | ICD-10-CM | POA: Diagnosis not present

## 2015-03-03 DIAGNOSIS — G20 Parkinson's disease: Secondary | ICD-10-CM

## 2015-03-03 LAB — LIPID PANEL
Cholesterol: 192 mg/dL (ref 0–200)
HDL: 57.3 mg/dL (ref >39.00)
LDL Cholesterol: 121 mg/dL — ABNORMAL HIGH (ref 0–99)
NonHDL: 134.37
Total CHOL/HDL Ratio: 3
Triglycerides: 66 mg/dL (ref 0.0–149.0)
VLDL: 13.2 mg/dL (ref 0.0–40.0)

## 2015-03-03 LAB — BASIC METABOLIC PANEL
BUN: 20 mg/dL (ref 6–23)
CALCIUM: 9.1 mg/dL (ref 8.4–10.5)
CO2: 33 mEq/L — ABNORMAL HIGH (ref 19–32)
CREATININE: 0.71 mg/dL (ref 0.40–1.20)
Chloride: 102 mEq/L (ref 96–112)
GFR: 84.96 mL/min (ref >60.00)
GLUCOSE: 94 mg/dL (ref 70–99)
Potassium: 3.7 mEq/L (ref 3.5–5.1)
SODIUM: 141 meq/L (ref 135–145)

## 2015-03-03 LAB — HEPATIC FUNCTION PANEL
ALK PHOS: 73 U/L (ref 39–117)
ALT: 5 U/L (ref 0–35)
AST: 20 U/L (ref 0–37)
Albumin: 4.4 g/dL (ref 3.5–5.2)
Bilirubin, Direct: 0.1 mg/dL (ref 0.0–0.3)
Total Bilirubin: 0.6 mg/dL (ref 0.2–1.2)
Total Protein: 7.5 g/dL (ref 6.0–8.3)

## 2015-03-03 LAB — POCT URINALYSIS DIPSTICK
BILIRUBIN UA: NEGATIVE
Glucose, UA: NEGATIVE
KETONES UA: NEGATIVE
Leukocytes, UA: NEGATIVE
Nitrite, UA: NEGATIVE
PH UA: 7
PROTEIN UA: NEGATIVE
Spec Grav, UA: 1.015
Urobilinogen, UA: 2

## 2015-03-03 LAB — CBC WITH DIFFERENTIAL/PLATELET
Basophils Absolute: 0 10*3/uL (ref 0.0–0.1)
Basophils Relative: 0.4 % (ref 0.0–3.0)
EOS ABS: 0.1 10*3/uL (ref 0.0–0.7)
Eosinophils Relative: 1.7 % (ref 0.0–5.0)
HCT: 38.1 % (ref 36.0–46.0)
Hemoglobin: 12.7 g/dL (ref 12.0–15.0)
Lymphocytes Relative: 31.3 % (ref 12.0–46.0)
Lymphs Abs: 1.9 10*3/uL (ref 0.7–4.0)
MCHC: 33.3 g/dL (ref 30.0–36.0)
MCV: 93.7 fl (ref 78.0–100.0)
MONOS PCT: 6.1 % (ref 3.0–12.0)
Monocytes Absolute: 0.4 10*3/uL (ref 0.1–1.0)
NEUTROS PCT: 60.5 % (ref 43.0–77.0)
Neutro Abs: 3.6 10*3/uL (ref 1.4–7.7)
Platelets: 305 10*3/uL (ref 150.0–400.0)
RBC: 4.06 Mil/uL (ref 3.87–5.11)
RDW: 13.5 % (ref 11.5–15.5)
WBC: 5.9 10*3/uL (ref 4.0–10.5)

## 2015-03-03 MED ORDER — LOVASTATIN 20 MG PO TABS: 20.0000 mg | ORAL_TABLET | Freq: Every day | ORAL | Status: DC

## 2015-03-03 MED ORDER — ZOSTER VACCINE LIVE 19400 UNT/0.65ML ~~LOC~~ SOLR: 0.6500 mL | Freq: Once | SUBCUTANEOUS | Status: DC

## 2015-03-03 NOTE — Patient Instructions (Addendum)
Preventive Care for Adults A healthy lifestyle and preventive care can promote health and wellness. Preventive health guidelines for women include the following key practices.  A routine yearly physical is a good way to check with your health care provider about your health and preventive screening. It is a chance to share any concerns and updates on your health and to receive a thorough exam.  Visit your dentist for a routine exam and preventive care every 6 months. Brush your teeth twice a day and floss once a day. Good oral hygiene prevents tooth decay and gum disease.  The frequency of eye exams is based on your age, health, family medical history, use of contact lenses, and other factors. Follow your health care provider's recommendations for frequency of eye exams.  Eat a healthy diet. Foods like vegetables, fruits, whole grains, low-fat dairy products, and lean protein foods contain the nutrients you need without too many calories. Decrease your intake of foods high in solid fats, added sugars, and salt. Eat the right amount of calories for you.Get information about a proper diet from your health care provider, if necessary.  Regular physical exercise is one of the most important things you can do for your health. Most adults should get at least 150 minutes of moderate-intensity exercise (any activity that increases your heart rate and causes you to sweat) each week. In addition, most adults need muscle-strengthening exercises on 2 or more days a week.  Maintain a healthy weight. The body mass index (BMI) is a screening tool to identify possible weight problems. It provides an estimate of body fat based on height and weight. Your health care provider can find your BMI and can help you achieve or maintain a healthy weight.For adults 20 years and older:  A BMI below 18.5 is considered underweight.  A BMI of 18.5 to 24.9 is normal.  A BMI of 25 to 29.9 is considered overweight.  A BMI of  30 and above is considered obese.  Maintain normal blood lipids and cholesterol levels by exercising and minimizing your intake of saturated fat. Eat a balanced diet with plenty of fruit and vegetables. Blood tests for lipids and cholesterol should begin at age 76 and be repeated every 5 years. If your lipid or cholesterol levels are high, you are over 50, or you are at high risk for heart disease, you may need your cholesterol levels checked more frequently.Ongoing high lipid and cholesterol levels should be treated with medicines if diet and exercise are not working.  If you smoke, find out from your health care provider how to quit. If you do not use tobacco, do not start.  Lung cancer screening is recommended for adults aged 22-80 years who are at high risk for developing lung cancer because of a history of smoking. A yearly low-dose CT scan of the lungs is recommended for people who have at least a 30-pack-year history of smoking and are a current smoker or have quit within the past 15 years. A pack year of smoking is smoking an average of 1 pack of cigarettes a day for 1 year (for example: 1 pack a day for 30 years or 2 packs a day for 15 years). Yearly screening should continue until the smoker has stopped smoking for at least 15 years. Yearly screening should be stopped for people who develop a health problem that would prevent them from having lung cancer treatment.  If you are pregnant, do not drink alcohol. If you are breastfeeding,  be very cautious about drinking alcohol. If you are not pregnant and choose to drink alcohol, do not have more than 1 drink per day. One drink is considered to be 12 ounces (355 mL) of beer, 5 ounces (148 mL) of wine, or 1.5 ounces (44 mL) of liquor.  Avoid use of street drugs. Do not share needles with anyone. Ask for help if you need support or instructions about stopping the use of drugs.  High blood pressure causes heart disease and increases the risk of  stroke. Your blood pressure should be checked at least every 1 to 2 years. Ongoing high blood pressure should be treated with medicines if weight loss and exercise do not work.  If you are 75-52 years old, ask your health care provider if you should take aspirin to prevent strokes.  Diabetes screening involves taking a blood sample to check your fasting blood sugar level. This should be done once every 3 years, after age 15, if you are within normal weight and without risk factors for diabetes. Testing should be considered at a younger age or be carried out more frequently if you are overweight and have at least 1 risk factor for diabetes.  Breast cancer screening is essential preventive care for women. You should practice "breast self-awareness." This means understanding the normal appearance and feel of your breasts and may include breast self-examination. Any changes detected, no matter how small, should be reported to a health care provider. Women in their 58s and 30s should have a clinical breast exam (CBE) by a health care provider as part of a regular health exam every 1 to 3 years. After age 16, women should have a CBE every year. Starting at age 53, women should consider having a mammogram (breast X-ray test) every year. Women who have a family history of breast cancer should talk to their health care provider about genetic screening. Women at a high risk of breast cancer should talk to their health care providers about having an MRI and a mammogram every year.  Breast cancer gene (BRCA)-related cancer risk assessment is recommended for women who have family members with BRCA-related cancers. BRCA-related cancers include breast, ovarian, tubal, and peritoneal cancers. Having family members with these cancers may be associated with an increased risk for harmful changes (mutations) in the breast cancer genes BRCA1 and BRCA2. Results of the assessment will determine the need for genetic counseling and  BRCA1 and BRCA2 testing.  Routine pelvic exams to screen for cancer are no longer recommended for nonpregnant women who are considered low risk for cancer of the pelvic organs (ovaries, uterus, and vagina) and who do not have symptoms. Ask your health care provider if a screening pelvic exam is right for you.  If you have had past treatment for cervical cancer or a condition that could lead to cancer, you need Pap tests and screening for cancer for at least 20 years after your treatment. If Pap tests have been discontinued, your risk factors (such as having a new sexual partner) need to be reassessed to determine if screening should be resumed. Some women have medical problems that increase the chance of getting cervical cancer. In these cases, your health care provider may recommend more frequent screening and Pap tests.  The HPV test is an additional test that may be used for cervical cancer screening. The HPV test looks for the virus that can cause the cell changes on the cervix. The cells collected during the Pap test can be  tested for HPV. The HPV test could be used to screen women aged 30 years and older, and should be used in women of any age who have unclear Pap test results. After the age of 30, women should have HPV testing at the same frequency as a Pap test.  Colorectal cancer can be detected and often prevented. Most routine colorectal cancer screening begins at the age of 50 years and continues through age 75 years. However, your health care provider may recommend screening at an earlier age if you have risk factors for colon cancer. On a yearly basis, your health care provider may provide home test kits to check for hidden blood in the stool. Use of a small camera at the end of a tube, to directly examine the colon (sigmoidoscopy or colonoscopy), can detect the earliest forms of colorectal cancer. Talk to your health care provider about this at age 50, when routine screening begins. Direct  exam of the colon should be repeated every 5-10 years through age 75 years, unless early forms of pre-cancerous polyps or small growths are found.  People who are at an increased risk for hepatitis B should be screened for this virus. You are considered at high risk for hepatitis B if:  You were born in a country where hepatitis B occurs often. Talk with your health care provider about which countries are considered high risk.  Your parents were born in a high-risk country and you have not received a shot to protect against hepatitis B (hepatitis B vaccine).  You have HIV or AIDS.  You use needles to inject street drugs.  You live with, or have sex with, someone who has hepatitis B.  You get hemodialysis treatment.  You take certain medicines for conditions like cancer, organ transplantation, and autoimmune conditions.  Hepatitis C blood testing is recommended for all people born from 1945 through 1965 and any individual with known risks for hepatitis C.  Practice safe sex. Use condoms and avoid high-risk sexual practices to reduce the spread of sexually transmitted infections (STIs). STIs include gonorrhea, chlamydia, syphilis, trichomonas, herpes, HPV, and human immunodeficiency virus (HIV). Herpes, HIV, and HPV are viral illnesses that have no cure. They can result in disability, cancer, and death.  You should be screened for sexually transmitted illnesses (STIs) including gonorrhea and chlamydia if:  You are sexually active and are younger than 24 years.  You are older than 24 years and your health care provider tells you that you are at risk for this type of infection.  Your sexual activity has changed since you were last screened and you are at an increased risk for chlamydia or gonorrhea. Ask your health care provider if you are at risk.  If you are at risk of being infected with HIV, it is recommended that you take a prescription medicine daily to prevent HIV infection. This is  called preexposure prophylaxis (PrEP). You are considered at risk if:  You are a heterosexual woman, are sexually active, and are at increased risk for HIV infection.  You take drugs by injection.  You are sexually active with a partner who has HIV.  Talk with your health care provider about whether you are at high risk of being infected with HIV. If you choose to begin PrEP, you should first be tested for HIV. You should then be tested every 3 months for as long as you are taking PrEP.  Osteoporosis is a disease in which the bones lose minerals and strength   with aging. This can result in serious bone fractures or breaks. The risk of osteoporosis can be identified using a bone density scan. Women ages 65 years and over and women at risk for fractures or osteoporosis should discuss screening with their health care providers. Ask your health care provider whether you should take a calcium supplement or vitamin D to reduce the rate of osteoporosis.  Menopause can be associated with physical symptoms and risks. Hormone replacement therapy is available to decrease symptoms and risks. You should talk to your health care provider about whether hormone replacement therapy is right for you.  Use sunscreen. Apply sunscreen liberally and repeatedly throughout the day. You should seek shade when your shadow is shorter than you. Protect yourself by wearing long sleeves, pants, a wide-brimmed hat, and sunglasses year round, whenever you are outdoors.  Once a month, do a whole body skin exam, using a mirror to look at the skin on your back. Tell your health care provider of new moles, moles that have irregular borders, moles that are larger than a pencil eraser, or moles that have changed in shape or color.  Stay current with required vaccines (immunizations).  Influenza vaccine. All adults should be immunized every year.  Tetanus, diphtheria, and acellular pertussis (Td, Tdap) vaccine. Pregnant women should  receive 1 dose of Tdap vaccine during each pregnancy. The dose should be obtained regardless of the length of time since the last dose. Immunization is preferred during the 27th-36th week of gestation. An adult who has not previously received Tdap or who does not know her vaccine status should receive 1 dose of Tdap. This initial dose should be followed by tetanus and diphtheria toxoids (Td) booster doses every 10 years. Adults with an unknown or incomplete history of completing a 3-dose immunization series with Td-containing vaccines should begin or complete a primary immunization series including a Tdap dose. Adults should receive a Td booster every 10 years.  Varicella vaccine. An adult without evidence of immunity to varicella should receive 2 doses or a second dose if she has previously received 1 dose. Pregnant females who do not have evidence of immunity should receive the first dose after pregnancy. This first dose should be obtained before leaving the health care facility. The second dose should be obtained 4-8 weeks after the first dose.  Human papillomavirus (HPV) vaccine. Females aged 13-26 years who have not received the vaccine previously should obtain the 3-dose series. The vaccine is not recommended for use in pregnant females. However, pregnancy testing is not needed before receiving a dose. If a female is found to be pregnant after receiving a dose, no treatment is needed. In that case, the remaining doses should be delayed until after the pregnancy. Immunization is recommended for any person with an immunocompromised condition through the age of 26 years if she did not get any or all doses earlier. During the 3-dose series, the second dose should be obtained 4-8 weeks after the first dose. The third dose should be obtained 24 weeks after the first dose and 16 weeks after the second dose.  Zoster vaccine. One dose is recommended for adults aged 60 years or older unless certain conditions are  present.  Measles, mumps, and rubella (MMR) vaccine. Adults born before 1957 generally are considered immune to measles and mumps. Adults born in 1957 or later should have 1 or more doses of MMR vaccine unless there is a contraindication to the vaccine or there is laboratory evidence of immunity to   each of the three diseases. A routine second dose of MMR vaccine should be obtained at least 28 days after the first dose for students attending postsecondary schools, health care workers, or international travelers. People who received inactivated measles vaccine or an unknown type of measles vaccine during 1963-1967 should receive 2 doses of MMR vaccine. People who received inactivated mumps vaccine or an unknown type of mumps vaccine before 1979 and are at high risk for mumps infection should consider immunization with 2 doses of MMR vaccine. For females of childbearing age, rubella immunity should be determined. If there is no evidence of immunity, females who are not pregnant should be vaccinated. If there is no evidence of immunity, females who are pregnant should delay immunization until after pregnancy. Unvaccinated health care workers born before 1957 who lack laboratory evidence of measles, mumps, or rubella immunity or laboratory confirmation of disease should consider measles and mumps immunization with 2 doses of MMR vaccine or rubella immunization with 1 dose of MMR vaccine.  Pneumococcal 13-valent conjugate (PCV13) vaccine. When indicated, a person who is uncertain of her immunization history and has no record of immunization should receive the PCV13 vaccine. An adult aged 19 years or older who has certain medical conditions and has not been previously immunized should receive 1 dose of PCV13 vaccine. This PCV13 should be followed with a dose of pneumococcal polysaccharide (PPSV23) vaccine. The PPSV23 vaccine dose should be obtained at least 8 weeks after the dose of PCV13 vaccine. An adult aged 19  years or older who has certain medical conditions and previously received 1 or more doses of PPSV23 vaccine should receive 1 dose of PCV13. The PCV13 vaccine dose should be obtained 1 or more years after the last PPSV23 vaccine dose.  Pneumococcal polysaccharide (PPSV23) vaccine. When PCV13 is also indicated, PCV13 should be obtained first. All adults aged 65 years and older should be immunized. An adult younger than age 65 years who has certain medical conditions should be immunized. Any person who resides in a nursing home or long-term care facility should be immunized. An adult smoker should be immunized. People with an immunocompromised condition and certain other conditions should receive both PCV13 and PPSV23 vaccines. People with human immunodeficiency virus (HIV) infection should be immunized as soon as possible after diagnosis. Immunization during chemotherapy or radiation therapy should be avoided. Routine use of PPSV23 vaccine is not recommended for American Indians, [place] Natives, or people younger than 65 years unless there are medical conditions that require PPSV23 vaccine. When indicated, people who have unknown immunization and have no record of immunization should receive PPSV23 vaccine. One-time revaccination 5 years after the first dose of PPSV23 is recommended for people aged 19-64 years who have chronic kidney failure, nephrotic syndrome, asplenia, or immunocompromised conditions. People who received 1-2 doses of PPSV23 before age 65 years should receive another dose of PPSV23 vaccine at age 65 years or later if at least 5 years have passed since the previous dose. Doses of PPSV23 are not needed for people immunized with PPSV23 at or after age 65 years.  Meningococcal vaccine. Adults with asplenia or persistent complement component deficiencies should receive 2 doses of quadrivalent meningococcal conjugate (MenACWY-D) vaccine. The doses should be obtained at least 2 months apart.  Microbiologists working with certain meningococcal bacteria, military recruits, people at risk during an outbreak, and people who travel to or live in countries with a high rate of meningitis should be immunized. A first-year college student up through age   21 years who is living in a residence hall should receive a dose if she did not receive a dose on or after her 16th birthday. Adults who have certain high-risk conditions should receive one or more doses of vaccine.  Hepatitis A vaccine. Adults who wish to be protected from this disease, have certain high-risk conditions, work with hepatitis A-infected animals, work in hepatitis A research labs, or travel to or work in countries with a high rate of hepatitis A should be immunized. Adults who were previously unvaccinated and who anticipate close contact with an international adoptee during the first 60 days after arrival in the [place] from a country with a high rate of hepatitis A should be immunized.  Hepatitis B vaccine. Adults who wish to be protected from this disease, have certain high-risk conditions, may be exposed to blood or other infectious body fluids, are household contacts or sex partners of hepatitis B positive people, are clients or workers in certain care facilities, or travel to or work in countries with a high rate of hepatitis B should be immunized.  Haemophilus influenzae type b (Hib) vaccine. A previously unvaccinated person with asplenia or sickle cell disease or having a scheduled splenectomy should receive 1 dose of Hib vaccine. Regardless of previous immunization, a recipient of a hematopoietic stem cell transplant should receive a 3-dose series 6-12 months after her successful transplant. Hib vaccine is not recommended for adults with HIV infection. Preventive Services / Frequency Ages 64 to 68 years  Blood pressure check.** / Every 1 to 2 years.  Lipid and cholesterol check.** / Every 5 years beginning at age  22.  Clinical breast exam.** / Every 3 years for women in their 88s and 53s.  BRCA-related cancer risk assessment.** / For women who have family members with a BRCA-related cancer (breast, ovarian, tubal, or peritoneal cancers).  Pap test.** / Every 2 years from ages 90 through 51. Every 3 years starting at age 21 through age 56 or 3 with a history of 3 consecutive normal Pap tests.  HPV screening.** / Every 3 years from ages 24 through ages 1 to 46 with a history of 3 consecutive normal Pap tests.  Hepatitis C blood test.** / For any individual with known risks for hepatitis C.  Skin self-exam. / Monthly.  Influenza vaccine. / Every year.  Tetanus, diphtheria, and acellular pertussis (Tdap, Td) vaccine.** / Consult your health care provider. Pregnant women should receive 1 dose of Tdap vaccine during each pregnancy. 1 dose of Td every 10 years.  Varicella vaccine.** / Consult your health care provider. Pregnant females who do not have evidence of immunity should receive the first dose after pregnancy.  HPV vaccine. / 3 doses over 6 months, if 72 and younger. The vaccine is not recommended for use in pregnant females. However, pregnancy testing is not needed before receiving a dose.  Measles, mumps, rubella (MMR) vaccine.** / You need at least 1 dose of MMR if you were born in 1957 or later. You may also need a 2nd dose. For females of childbearing age, rubella immunity should be determined. If there is no evidence of immunity, females who are not pregnant should be vaccinated. If there is no evidence of immunity, females who are pregnant should delay immunization until after pregnancy.  Pneumococcal 13-valent conjugate (PCV13) vaccine.** / Consult your health care provider.  Pneumococcal polysaccharide (PPSV23) vaccine.** / 1 to 2 doses if you smoke cigarettes or if you have certain conditions.  Meningococcal vaccine.** /  1 dose if you are age 19 to 21 years and a first-year college  student living in a residence hall, or have one of several medical conditions, you need to get vaccinated against meningococcal disease. You may also need additional booster doses.  Hepatitis A vaccine.** / Consult your health care provider.  Hepatitis B vaccine.** / Consult your health care provider.  Haemophilus influenzae type b (Hib) vaccine.** / Consult your health care provider. Ages 40 to 64 years  Blood pressure check.** / Every 1 to 2 years.  Lipid and cholesterol check.** / Every 5 years beginning at age 20 years.  Lung cancer screening. / Every year if you are aged 55-80 years and have a 30-pack-year history of smoking and currently smoke or have quit within the past 15 years. Yearly screening is stopped once you have quit smoking for at least 15 years or develop a health problem that would prevent you from having lung cancer treatment.  Clinical breast exam.** / Every year after age 40 years.  BRCA-related cancer risk assessment.** / For women who have family members with a BRCA-related cancer (breast, ovarian, tubal, or peritoneal cancers).  Mammogram.** / Every year beginning at age 40 years and continuing for as long as you are in good health. Consult with your health care provider.  Pap test.** / Every 3 years starting at age 30 years through age 65 or 70 years with a history of 3 consecutive normal Pap tests.  HPV screening.** / Every 3 years from ages 30 years through ages 65 to 70 years with a history of 3 consecutive normal Pap tests.  Fecal occult blood test (FOBT) of stool. / Every year beginning at age 50 years and continuing until age 75 years. You may not need to do this test if you get a colonoscopy every 10 years.  Flexible sigmoidoscopy or colonoscopy.** / Every 5 years for a flexible sigmoidoscopy or every 10 years for a colonoscopy beginning at age 50 years and continuing until age 75 years.  Hepatitis C blood test.** / For all people born from 1945 through  1965 and any individual with known risks for hepatitis C.  Skin self-exam. / Monthly.  Influenza vaccine. / Every year.  Tetanus, diphtheria, and acellular pertussis (Tdap/Td) vaccine.** / Consult your health care provider. Pregnant women should receive 1 dose of Tdap vaccine during each pregnancy. 1 dose of Td every 10 years.  Varicella vaccine.** / Consult your health care provider. Pregnant females who do not have evidence of immunity should receive the first dose after pregnancy.  Zoster vaccine.** / 1 dose for adults aged 60 years or older.  Measles, mumps, rubella (MMR) vaccine.** / You need at least 1 dose of MMR if you were born in 1957 or later. You may also need a 2nd dose. For females of childbearing age, rubella immunity should be determined. If there is no evidence of immunity, females who are not pregnant should be vaccinated. If there is no evidence of immunity, females who are pregnant should delay immunization until after pregnancy.  Pneumococcal 13-valent conjugate (PCV13) vaccine.** / Consult your health care provider.  Pneumococcal polysaccharide (PPSV23) vaccine.** / 1 to 2 doses if you smoke cigarettes or if you have certain conditions.  Meningococcal vaccine.** / Consult your health care provider.  Hepatitis A vaccine.** / Consult your health care provider.  Hepatitis B vaccine.** / Consult your health care provider.  Haemophilus influenzae type b (Hib) vaccine.** / Consult your health care provider. Ages 65   years and over  Blood pressure check.** / Every 1 to 2 years.  Lipid and cholesterol check.** / Every 5 years beginning at age 53 years.  Lung cancer screening. / Every year if you are aged 9-80 years and have a 30-pack-year history of smoking and currently smoke or have quit within the past 15 years. Yearly screening is stopped once you have quit smoking for at least 15 years or develop a health problem that would prevent you from having lung cancer  treatment.  Clinical breast exam.** / Every year after age 55 years.  BRCA-related cancer risk assessment.** / For women who have family members with a BRCA-related cancer (breast, ovarian, tubal, or peritoneal cancers).  Mammogram.** / Every year beginning at age 4 years and continuing for as long as you are in good health. Consult with your health care provider.  Pap test.** / Every 3 years starting at age 46 years through age 27 or 58 years with 3 consecutive normal Pap tests. Testing can be stopped between 65 and 70 years with 3 consecutive normal Pap tests and no abnormal Pap or HPV tests in the past 10 years.  HPV screening.** / Every 3 years from ages 53 years through ages 13 or 23 years with a history of 3 consecutive normal Pap tests. Testing can be stopped between 65 and 70 years with 3 consecutive normal Pap tests and no abnormal Pap or HPV tests in the past 10 years.  Fecal occult blood test (FOBT) of stool. / Every year beginning at age 29 years and continuing until age 60 years. You may not need to do this test if you get a colonoscopy every 10 years.  Flexible sigmoidoscopy or colonoscopy.** / Every 5 years for a flexible sigmoidoscopy or every 10 years for a colonoscopy beginning at age 5 years and continuing until age 49 years.  Hepatitis C blood test.** / For all people born from [birth year] through 1967 and any individual with known risks for hepatitis C.  Osteoporosis screening.** / A one-time screening for women ages 12 years and over and women at risk for fractures or osteoporosis.  Skin self-exam. / Monthly.  Influenza vaccine. / Every year.  Tetanus, diphtheria, and acellular pertussis (Tdap/Td) vaccine.** / 1 dose of Td every 10 years.  Varicella vaccine.** / Consult your health care provider.  Zoster vaccine.** / 1 dose for adults aged 85 years or older.  Pneumococcal 13-valent conjugate (PCV13) vaccine.** / Consult your health care provider.  Pneumococcal  polysaccharide (PPSV23) vaccine.** / 1 dose for all adults aged 64 years and older.  Meningococcal vaccine.** / Consult your health care provider.  Hepatitis A vaccine.** / Consult your health care provider.  Hepatitis B vaccine.** / Consult your health care provider.  Haemophilus influenzae type b (Hib) vaccine.** / Consult your health care provider. ** Family history and personal history of risk and conditions may change your health care provider's recommendations. Document Released: 08/31/2001 Document Revised: 11/19/2013 Document Reviewed: 11/30/2010 [facility] Patient Information 2015 [place], [place]. This information is not intended to replace advice given to you by your health care provider. Make sure you discuss any questions you have with your health care provider. ++++++++++++++++++++++++++++++++++++++++++++++++++++++++++++++++++++++++++++++++++++++++

## 2015-03-03 NOTE — Progress Notes (Signed)
Pre visit review using our clinic review tool, if applicable. No additional management support is needed unless otherwise documented below in the visit note. 

## 2015-03-03 NOTE — Telephone Encounter (Signed)
-----   Message from Rosalita Chessman, DO sent at 03/03/2015 11:11 AM EDT ----- We need her mammogram from [facility]

## 2015-03-03 NOTE — Telephone Encounter (Signed)
Request for Cologard. Order has been faxed.      KP

## 2015-03-03 NOTE — Progress Notes (Signed)
Subjective:   Elizabeth Mccullough is a 74 y.o. female who presents for Medicare Annual (Subsequent) preventive examination.  Review of Systems:   Review of Systems  Constitutional: Negative for activity change, appetite change and fatigue.  HENT: Negative for hearing loss, congestion, tinnitus and ear discharge.   Eyes: Negative for visual disturbance (see optho q1y -- vision corrected to 20/20 with glasses).  Respiratory: Negative for cough, chest tightness and shortness of breath.   Cardiovascular: Negative for chest pain, palpitations and leg swelling.  Gastrointestinal: Negative for abdominal pain, diarrhea, constipation and abdominal distention.  Genitourinary: Negative for urgency, frequency, decreased urine volume and difficulty urinating.  Musculoskeletal: Negative for back pain, arthralgias and gait problem.  Skin: Negative for color change, pallor and rash.  Neurological: Negative for dizziness, light-headedness, numbness and headaches.  Hematological: Negative for adenopathy. Does not bruise/bleed easily.  Psychiatric/Behavioral: Negative for suicidal ideas, confusion, sleep disturbance, self-injury, dysphoric mood, decreased concentration and agitation.  Pt is able to read and write and can do all ADLs No risk for falling No abuse/ violence in home           Objective:     Vitals: BP 120/84 mmHg  Pulse 75  Temp(Src) 98.6 F (37 C) (Oral)  Ht 5\' 2"  (1.575 m)  Wt 150 lb 3.2 oz (68.13 kg)  BMI 27.46 kg/m2  SpO2 98%  LMP 07/19/1988 BP 120/84 mmHg  Pulse 75  Temp(Src) 98.6 F (37 C) (Oral)  Ht 5\' 2"  (1.575 m)  Wt 150 lb 3.2 oz (68.13 kg)  BMI 27.46 kg/m2  SpO2 98%  LMP 07/19/1988 General appearance: alert, cooperative, appears stated age and no distress Head: Normocephalic, without obvious abnormality, atraumatic Eyes: conjunctivae/corneas clear. PERRL, EOM's intact. Fundi benign. Ears: normal TM's and external ear canals both ears Nose: Nares normal.  Septum midline. Mucosa normal. No drainage or sinus tenderness. Throat: lips, mucosa, and tongue normal; teeth and gums normal Neck: no adenopathy, no carotid bruit, no JVD, supple, symmetrical, trachea midline and thyroid not enlarged, symmetric, no tenderness/mass/nodules Back: symmetric, no curvature. ROM normal. No CVA tenderness. Lungs: clear to auscultation bilaterally Breasts: normal appearance, no masses or tenderness Heart: regular rate and rhythm, S1, S2 normal, no murmur, click, rub or gallop Abdomen: soft, non-tender; bowel sounds normal; no masses,  no organomegaly Pelvic: not indicated; post-menopausal, no abnormal Pap smears in past Extremities: extremities normal, atraumatic, no cyanosis or edema Pulses: 2+ and symmetric Skin: Skin color, texture, turgor normal. No rashes or lesions Lymph nodes: Cervical, supraclavicular, and axillary nodes normal. Neurologic: Alert and oriented X 3, normal strength and tone. Normal symmetric reflexes. Normal coordination and gait Psych- no depression, no anxiety Tobacco History  Smoking status  . Never Smoker   Smokeless tobacco  . Never Used     Counseling given: Not Answered   Past Medical History  Diagnosis Date  . Hyperlipidemia   . AAA (abdominal aortic aneurysm)    Past Surgical History  Procedure Laterality Date  . Hammer toe surgery  04/10/02    Left Toe  . Breast cyst aspiration  1965    Right Breast  . Nasal septum surgery  1980   Family History  Problem Relation Age of Onset  . Breast cancer    . Colon cancer    . Heart disease Mother   . Hyperlipidemia Sister   . Hyperlipidemia Brother   . Diabetes Sister   . Hyperlipidemia Sister   . Cancer Sister 81  colon  . Cancer Sister     breast  . Stroke Brother   . Hypertension Brother    History  Sexual Activity  . Sexual Activity:  . Partners: Male    Outpatient Encounter Prescriptions as of 03/03/2015  Medication Sig  . aspirin 325 MG EC tablet  Take 81 mg by mouth 2 (two) times daily.   . carbidopa-levodopa (SINEMET IR) 25-100 MG per tablet Take 1 tablet by mouth 3 (three) times daily.  . Doxylamine Succinate, Sleep, (SLEEP AID PO) Take by mouth at bedtime.  Marland Kitchen ibuprofen (ADVIL) 200 MG tablet Take 2 tablets (400 mg total) by mouth every 6 (six) hours as needed for pain.  Marland Kitchen lovastatin (MEVACOR) 20 MG tablet Take 20 mg by mouth at bedtime.  . magnesium hydroxide (PHILLIPS CHEWS) 311 MG CHEW Chew 311 mg by mouth every 4 (four) hours as needed.  . Multiple Vitamin (MULTIVITAMIN) tablet Take 1 tablet by mouth daily.  Marland Kitchen venlafaxine XR (EFFEXOR-XR) 75 MG 24 hr capsule TAKE 1 CAPSULE BY MOUTH DAILY.  . [DISCONTINUED] lovastatin (MEVACOR) 20 MG tablet TAKE TWO TABLETS BY MOUTH AT BEDTIME   No facility-administered encounter medications on file as of 03/03/2015.    Activities of Daily Living In your present state of health, do you have any difficulty performing the following activities: 03/03/2015  Hearing? N  Vision? N  Difficulty concentrating or making decisions? N  Walking or climbing stairs? N  Dressing or bathing? N  Doing errands, shopping? N    Patient Care Team: Rosalita Chessman, DO as PCP - General    Assessment:    CPE Exercise Activities and Dietary recommendations-- walks to mailbox, walks around mall     Goals    None     Fall Risk Fall Risk  03/03/2015 01/16/2014  Falls in the past year? No Yes  Number falls in past yr: - 2 or more  Risk Factor Category  - High Fall Risk  Risk for fall due to : - Other (Comment)  Risk for fall due to (comments): - Shoes-- patient has since stopped wearing them   Depression Screen PHQ 2/9 Scores 03/03/2015 01/16/2014 12/13/2012 12/13/2012  PHQ - 2 Score 0 0 0 0     Cognitive Testing mmse 30/30  Immunization History  Administered Date(s) Administered  . Influenza Split 05/11/2011, 05/10/2012  . Influenza Whole 05/16/2007, 04/16/2008, 05/19/2009, 04/29/2010  . Influenza, High  Dose Seasonal PF 05/24/2013  . Influenza,inj,Quad PF,36+ Mos 04/29/2014  . Pneumococcal Conjugate-13 01/16/2014  . Pneumococcal Polysaccharide-23 06/03/2004  . Td 07/05/2006  . Tdap 04/10/2013   Screening Tests Health Maintenance  Topic Date Due  . ZOSTAVAX  09/15/1998  . INFLUENZA VACCINE  05/03/2015 (Originally 02/17/2015)  . COLONOSCOPY  07/07/2016  . TETANUS/TDAP  04/11/2023  . DEXA SCAN  Completed  . PNA vac Low Risk Adult  Completed      Plan:    see AVS Check labs During the course of the visit the patient was educated and counseled about the following appropriate screening and preventive services:   Vaccines to include Pneumoccal, Influenza, Hepatitis B, Td, Zostavax, HCV  Electrocardiogram  Cardiovascular Disease  Colorectal cancer screening  Bone density screening  Diabetes screening  Glaucoma screening  Mammography/PAP  Nutrition counseling   Patient Instructions (the written plan) was given to the patient.  1. Preventative health care    2. Parkinson disease Per neuro  3. Generalized anxiety disorder On effexer  4. Hyperlipidemia On pravastatin -  Basic metabolic panel - CBC with Differential/Platelet - Hepatic function panel - Lipid panel - POCT urinalysis dipstick - lovastatin (MEVACOR) 20 MG tablet; Take 1 tablet (20 mg total) by mouth at bedtime.  Dispense: 90 tablet; Refill: [place], DO  03/03/2015

## 2015-03-06 LAB — URINE CULTURE: Colony Count: 60000

## 2015-03-07 DIAGNOSIS — E785 Hyperlipidemia, unspecified: Secondary | ICD-10-CM

## 2015-03-07 MED ORDER — LOVASTATIN 40 MG PO TABS: 40.0000 mg | ORAL_TABLET | Freq: Every day | ORAL | Status: DC

## 2015-03-17 ENCOUNTER — Other Ambulatory Visit: Payer: Self-pay | Admitting: Neurology

## 2015-03-17 NOTE — Telephone Encounter (Signed)
Carbidopa Levodopa 25/100 refill requested. Per last office note- patient to remain on medication. Refill approved and sent to patient's pharmacy.   

## 2015-05-12 ENCOUNTER — Other Ambulatory Visit: Payer: Self-pay | Admitting: Family Medicine

## 2015-05-15 ENCOUNTER — Ambulatory Visit (INDEPENDENT_AMBULATORY_CARE_PROVIDER_SITE_OTHER): Payer: Medicare Other

## 2015-05-15 DIAGNOSIS — Z23 Encounter for immunization: Secondary | ICD-10-CM

## 2015-05-21 ENCOUNTER — Other Ambulatory Visit: Payer: Self-pay | Admitting: Family Medicine

## 2015-05-26 ENCOUNTER — Encounter: Payer: Self-pay | Admitting: Medical

## 2015-05-26 ENCOUNTER — Ambulatory Visit (HOSPITAL_BASED_OUTPATIENT_CLINIC_OR_DEPARTMENT_OTHER)
Admission: RE | Admit: 2015-05-26 | Discharge: 2015-05-26 | Disposition: A | Discharge status: Home / Self Care | Payer: Medicare Other | Source: Ambulatory Visit | Attending: Medical | Admitting: Medical

## 2015-05-26 ENCOUNTER — Other Ambulatory Visit: Payer: Self-pay | Admitting: Medical

## 2015-05-26 ENCOUNTER — Telehealth: Payer: Self-pay | Admitting: Medical

## 2015-05-26 ENCOUNTER — Ambulatory Visit (INDEPENDENT_AMBULATORY_CARE_PROVIDER_SITE_OTHER): Payer: Medicare Other | Admitting: Medical

## 2015-05-26 VITALS — BP 140/90 | HR 85 | Temp 98.2°F | Wt 153.2 lb

## 2015-05-26 DIAGNOSIS — M25561 Pain in right knee: Secondary | ICD-10-CM

## 2015-05-26 DIAGNOSIS — M79661 Pain in right lower leg: Secondary | ICD-10-CM | POA: Diagnosis not present

## 2015-05-26 DIAGNOSIS — M79604 Pain in right leg: Secondary | ICD-10-CM | POA: Diagnosis not present

## 2015-05-26 DIAGNOSIS — M179 Osteoarthritis of knee, unspecified: Secondary | ICD-10-CM | POA: Insufficient documentation

## 2015-05-26 MED ORDER — TRAMADOL HCL 50 MG PO TABS: 50.0000 mg | ORAL_TABLET | Freq: Two times a day (BID) | ORAL | Status: DC | PRN

## 2015-05-26 NOTE — Patient Instructions (Addendum)
Will get rt knee and tibia/fibula xray today.  Will also get stat lower ext doppler.  Ibuprofen otc 200-400 mg by mouth  3 times a day.  Tramadol for pain.  Follow up in 7 days or as needed

## 2015-05-26 NOTE — Telephone Encounter (Signed)
Opened to send note. Then sent through result note attache to xray.

## 2015-05-26 NOTE — Progress Notes (Signed)
Subjective:    Patient ID: Elizabeth Mccullough, female    DOB: 05-20-1941, 74 y.o.   MRN: [id number]  HPI  Pt in with some rt lower ext pain. Pain was rt lateral calf pain. But now some pain know rt knee anterior aspect and in popliteal fossa. Pain since past Thursday. No injury of fall.     Review of Systems  Constitutional: Negative for fever, chills and fatigue.  Respiratory: Negative for cough, chest tightness, shortness of breath and wheezing.   Cardiovascular: Negative for chest pain and palpitations.  Gastrointestinal: Negative for abdominal pain.  Musculoskeletal: Negative for back pain, joint swelling and neck stiffness.       See hpi.  Skin: Negative for rash.  Neurological: Negative for dizziness and headaches.  Hematological: Negative for adenopathy. Does not bruise/bleed easily.    Past Medical History  Diagnosis Date  . Hyperlipidemia   . AAA (abdominal aortic aneurysm) [facility])     Social History   Social History  . Marital Status: Widowed    Spouse Name: N/A  . Number of Children: N/A  . Years of Education: N/A   Occupational History  . retired    Social History Main Topics  . Smoking status: Never Smoker   . Smokeless tobacco: Never Used  . Alcohol Use: Yes     Comment: social wine   . Drug Use: No  . Sexual Activity:    Partners: Male   Other Topics Concern  . Not on file   Social History Narrative   Exercise-- no    Past Surgical History  Procedure Laterality Date  . Hammer toe surgery  04/10/02    Left Toe  . Breast cyst aspiration  1965    Right Breast  . Nasal septum surgery  1980    Family History  Problem Relation Age of Onset  . Breast cancer    . Colon cancer    . Heart disease Mother   . Hyperlipidemia Sister   . Hyperlipidemia Brother   . Diabetes Sister   . Hyperlipidemia Sister   . Cancer Sister 82    colon  . Cancer Sister     breast  . Stroke Brother   . Hypertension Brother     Allergies  Allergen  Reactions  . Penicillins Hives  . Iohexol Rash     Code: RASH, Desc: PATIENT STATES SHE IS ALLERGIC TO IV DYE. 20 YRS AGO SHE HAD A REACTION AT [facility], PT WAS GIVEN BENADRYL. 07/13/06/[facility], Onset Date: [id number]     Current Outpatient Prescriptions on File Prior to Visit  Medication Sig Dispense Refill  . aspirin 325 MG EC tablet Take 81 mg by mouth 2 (two) times daily.     . carbidopa-levodopa (SINEMET IR) 25-100 MG per tablet Take 1 tablet by mouth 3 (three) times daily. 90 tablet 5  . Doxylamine Succinate, Sleep, (SLEEP AID PO) Take by mouth at bedtime.    Marland Kitchen ibuprofen (ADVIL) 200 MG tablet Take 2 tablets (400 mg total) by mouth every 6 (six) hours as needed for pain. 30 tablet 0  . lovastatin (MEVACOR) 20 MG tablet TAKE TWO TABLETS BY MOUTH AT BEDTIME 180 tablet 1  . magnesium hydroxide (PHILLIPS CHEWS) 311 MG CHEW Chew 311 mg by mouth every 4 (four) hours as needed.    . Multiple Vitamin (MULTIVITAMIN) tablet Take 1 tablet by mouth daily.    Marland Kitchen venlafaxine XR (EFFEXOR-XR) 75 MG 24 hr capsule TAKE 1 CAPSULE BY  MOUTH DAILY. 90 capsule 1  . zoster vaccine live, PF, (ZOSTAVAX) 38101 UNT/0.65ML injection Inject 19,400 Units into the skin once. 1 vial 0   No current facility-administered medications on file prior to visit.    BP 140/90 mmHg  Pulse 85  Temp(Src) 98.2 F (36.8 C) (Oral)  Wt 153 lb 3.2 oz (69.491 kg)  SpO2 98%  LMP 07/19/1988       Objective:   Physical Exam  General- No acute distress. Pleasant patient. Neck- Full range of motion, no jvd Lungs- Clear, even and unlabored. Heart- regular rate and rhythm. Neurologic- CNII- XII grossly intact.  Abdomen- soft, nt, nd,+bs.  Rt knee- pain on flexion and extension. Pain anterior portion of knee. Lateral aspect mild tender.  Rt lower ext-- negative homans signs. Rt calf- lateral and medial calf tender. No redness, no warmth or tenderness. No dilated veins.      Assessment & Plan:  Will get rt knee and  tibia/fibula xray today.  Will also get stat lower ext doppler.  Ibuprofen otc 200-400 mg by mouth  3 times a day.  Tramadol for pain.  Follow up in 7 days or as needed

## 2015-05-26 NOTE — Progress Notes (Signed)
Pre visit review using our clinic review tool, if applicable. No additional management support is needed unless otherwise documented below in the visit note. 

## 2015-05-27 ENCOUNTER — Telehealth: Payer: Self-pay | Admitting: Family Medicine

## 2015-05-27 NOTE — Telephone Encounter (Signed)
Patient seen Elizabeth Mccullough, X-rays are not resulted. Will forward to him and Barnet Pall for report.     KP

## 2015-05-27 NOTE — Telephone Encounter (Signed)
Relation to JT:TSVX Call back number:[phone]  Reason for call:  Patient requesting x ray results please mail to home.

## 2015-05-28 NOTE — Telephone Encounter (Signed)
Can you forward that message to [place]. Accidentally sent to you.

## 2015-05-28 NOTE — Telephone Encounter (Signed)
I sent you a message on pt doppler and her xray. So not sure why she called? I reviewed both and notified you and then you notifed them? They called Elizabeth Mccullough with questions?

## 2015-05-28 NOTE — Telephone Encounter (Signed)
Spoke with pt and she voices understanding. Results will be mailed.

## 2015-06-02 ENCOUNTER — Telehealth: Payer: Self-pay | Admitting: Family Medicine

## 2015-06-02 ENCOUNTER — Other Ambulatory Visit: Payer: Self-pay | Admitting: Family Medicine

## 2015-06-02 DIAGNOSIS — M7120 Synovial cyst of popliteal space [Baker], unspecified knee: Secondary | ICD-10-CM

## 2015-06-02 NOTE — Telephone Encounter (Signed)
Referral in

## 2015-06-02 NOTE — Telephone Encounter (Signed)
Requesting referral to [facility] for bakers cyst. Requesting ASAP. Saw Edward last week. Please advise Cell [phone] Home [phone]

## 2015-06-02 NOTE — Telephone Encounter (Signed)
Please advise      KP 

## 2015-07-31 ENCOUNTER — Emergency Department (HOSPITAL_BASED_OUTPATIENT_CLINIC_OR_DEPARTMENT_OTHER): Payer: Medicare Other

## 2015-07-31 ENCOUNTER — Encounter (HOSPITAL_BASED_OUTPATIENT_CLINIC_OR_DEPARTMENT_OTHER): Payer: Self-pay | Admitting: *Deleted

## 2015-07-31 ENCOUNTER — Emergency Department (HOSPITAL_BASED_OUTPATIENT_CLINIC_OR_DEPARTMENT_OTHER)
Admission: EM | Admit: 2015-07-31 | Discharge: 2015-07-31 | Disposition: A | Discharge status: Home / Self Care | Payer: Medicare Other | Attending: Emergency Medicine | Admitting: Emergency Medicine

## 2015-07-31 DIAGNOSIS — Z79899 Other long term (current) drug therapy: Secondary | ICD-10-CM | POA: Diagnosis not present

## 2015-07-31 DIAGNOSIS — Y998 Other external cause status: Secondary | ICD-10-CM | POA: Insufficient documentation

## 2015-07-31 DIAGNOSIS — W06XXXA Fall from bed, initial encounter: Secondary | ICD-10-CM | POA: Insufficient documentation

## 2015-07-31 DIAGNOSIS — S0101XA Laceration without foreign body of scalp, initial encounter: Secondary | ICD-10-CM | POA: Diagnosis not present

## 2015-07-31 DIAGNOSIS — S79911A Unspecified injury of right hip, initial encounter: Secondary | ICD-10-CM | POA: Diagnosis not present

## 2015-07-31 DIAGNOSIS — Z8679 Personal history of other diseases of the circulatory system: Secondary | ICD-10-CM | POA: Insufficient documentation

## 2015-07-31 DIAGNOSIS — Y9389 Activity, other specified: Secondary | ICD-10-CM | POA: Insufficient documentation

## 2015-07-31 DIAGNOSIS — E785 Hyperlipidemia, unspecified: Secondary | ICD-10-CM | POA: Diagnosis not present

## 2015-07-31 DIAGNOSIS — Z880 Allergy status to penicillin: Secondary | ICD-10-CM | POA: Diagnosis not present

## 2015-07-31 DIAGNOSIS — S0990XA Unspecified injury of head, initial encounter: Secondary | ICD-10-CM

## 2015-07-31 DIAGNOSIS — Z7982 Long term (current) use of aspirin: Secondary | ICD-10-CM | POA: Insufficient documentation

## 2015-07-31 DIAGNOSIS — S4991XA Unspecified injury of right shoulder and upper arm, initial encounter: Secondary | ICD-10-CM | POA: Diagnosis not present

## 2015-07-31 DIAGNOSIS — Y9289 Other specified places as the place of occurrence of the external cause: Secondary | ICD-10-CM | POA: Diagnosis not present

## 2015-07-31 MED ORDER — HYDROCODONE-ACETAMINOPHEN 5-325 MG PO TABS: 1.0000 | ORAL_TABLET | Freq: Once | ORAL | Status: DC

## 2015-07-31 MED ORDER — LIDOCAINE-EPINEPHRINE 1 %-1:100000 IJ SOLN
10.0000 mL | Freq: Once | INTRAMUSCULAR | Status: AC
  Administered 2015-07-31: 1 mL
  Filled 2015-07-31: qty 1

## 2015-07-31 MED ORDER — LIDOCAINE-EPINEPHRINE 2 %-1:100000 IJ SOLN
20.0000 mL | Freq: Once | INTRAMUSCULAR | Status: AC
  Administered 2015-07-31: 1 mL
  Filled 2015-07-31: qty 1

## 2015-07-31 MED ORDER — NAPROXEN 250 MG PO TABS
500.0000 mg | ORAL_TABLET | Freq: Once | ORAL | Status: AC
  Administered 2015-07-31: 500 mg via ORAL
  Filled 2015-07-31: qty 2

## 2015-07-31 NOTE — ED Notes (Signed)
Pt reports trip and fall this am while getting oob. bandaid noted to right temple area with dried blood to area, and swelling. Pt denies loc. Also reports "a little" right hip pain.

## 2015-07-31 NOTE — Discharge Instructions (Signed)
Suture removal 7-10 days.  Concussion, Adult A concussion is a brain injury. It is caused by:  A hit to the head.  A quick and sudden movement (jolt) of the head or neck. A concussion is usually not life threatening. Even so, it can cause serious problems. If you had a concussion before, you may have concussion-like problems after a hit to your head. HOME CARE General Instructions  Follow your doctor's directions carefully.  Take medicines only as told by your doctor.  Only take medicines your doctor says are safe.  Do not drink alcohol until your doctor says it is okay. Alcohol and some drugs can slow down healing. They can also put you at risk for further injury.  If you are having trouble remembering things, write them down.  Try to do one thing at a time if you get distracted easily. For example, do not watch TV while making dinner.  Talk to your family members or close friends when making important decisions.  Follow up with your doctor as told.  Watch your symptoms. Tell others to do the same. Serious problems can sometimes happen after a concussion. Older adults are more likely to have these problems.  Tell your teachers, school nurse, school counselor, coach, Product/process development scientist, or work Freight forwarder about your concussion. Tell them about what you can or cannot do. They should watch to see if:  It gets even harder for you to pay attention or concentrate.  It gets even harder for you to remember things or learn new things.  You need more time than normal to finish things.  You become annoyed (irritable) more than before.  You are not able to deal with stress as well.  You have more problems than before.  Rest. Make sure you:  Get plenty of sleep at night.  Go to sleep early.  Go to bed at the same time every day. Try to wake up at the same time.  Rest during the day.  Take naps when you feel tired.  Limit activities where you have to think a lot or  concentrate. These include:  Doing homework.  Doing work related to a job.  Watching TV.  Using the computer. Returning To Your Regular Activities Return to your normal activities slowly, not all at once. You must give your body and brain enough time to heal.   Do not play sports or do other athletic activities until your doctor says it is okay.  Ask your doctor when you can drive, ride a bicycle, or work other vehicles or machines. Never do these things if you feel dizzy.  Ask your doctor about when you can return to work or school. Preventing Another Concussion It is very important to avoid another brain injury, especially before you have healed. In rare cases, another injury can lead to permanent brain damage, brain swelling, or death. The risk of this is greatest during the first 7-10 days after your injury. Avoid injuries by:   Wearing a seat belt when riding in a car.  Not drinking too much alcohol.  Avoiding activities that could lead to a second concussion (such as contact sports).  Wearing a helmet when doing activities like:  Biking.  Skiing.  Skateboarding.  Skating.  Making your home safer by:  Removing things from the floor or stairways that could make you trip.  Using grab bars in bathrooms and handrails by stairs.  Placing non-slip mats on floors and in bathtubs.  Improve lighting in dark  areas. GET HELP IF:  It gets even harder for you to pay attention or concentrate.  It gets even harder for you to remember things or learn new things.  You need more time than normal to finish things.  You become annoyed (irritable) more than before.  You are not able to deal with stress as well.  You have more problems than before.  You have problems keeping your balance.  You are not able to react quickly when you should. Get help if you have any of these problems for more than 2 weeks:   Lasting (chronic) headaches.  Dizziness or trouble  balancing.  Feeling sick to your stomach (nausea).  Seeing (vision) problems.  Being affected by noises or light more than normal.  Feeling sad, low, down in the dumps, blue, gloomy, or empty (depressed).  Mood changes (mood swings).  Feeling of fear or nervousness about what may happen (anxiety).  Feeling annoyed.  Memory problems.  Problems concentrating or paying attention.  Sleep problems.  Feeling tired all the time. GET HELP RIGHT AWAY IF:   You have bad headaches or your headaches get worse.  You have weakness (even if it is in one hand, leg, or part of the face).  You have loss of feeling (numbness).  You feel off balance.  You keep throwing up (vomiting).  You feel tired.  One black center of your eye (pupil) is larger than the other.  You twitch or shake violently (convulse).  Your speech is not clear (slurred).  You are more confused, easily angered (agitated), or annoyed than before.  You have more trouble resting than before.  You are unable to recognize people or places.  You have neck pain.  It is difficult to wake you up.  You have unusual behavior changes.  You pass out (lose consciousness). MAKE SURE YOU:   Understand these instructions.  Will watch your condition.  Will get help right away if you are not doing well or get worse.   This information is not intended to replace advice given to you by your health care provider. Make sure you discuss any questions you have with your health care provider.   Document Released: 06/23/2009 Document Revised: 07/26/2014 Document Reviewed: 01/25/2013 Elsevier Interactive Patient Education 2016 [place], Adult A laceration is a cut that goes through all of the layers of the skin and into the tissue that is right under the skin. Some lacerations heal on their own. Others need to be closed with stitches (sutures), staples, skin adhesive strips, or skin glue. Proper  laceration care minimizes the risk of infection and helps the laceration to heal better. HOW TO CARE FOR YOUR LACERATION If sutures or staples were used:  Keep the wound clean and dry.  If you were given a bandage (dressing), you should change it at least one time per day or as told by your health care provider. You should also change it if it becomes wet or dirty.  Keep the wound completely dry for the first 24 hours or as told by your health care provider. After that time, you may shower or bathe. However, make sure that the wound is not soaked in water until after the sutures or staples have been removed.  Clean the wound one time each day or as told by your health care provider:  Wash the wound with soap and water.  Rinse the wound with water to remove all soap.  Pat the wound  dry with a clean towel. Do not rub the wound.  After cleaning the wound, apply a thin layer of antibiotic ointmentas told by your health care provider. This will help to prevent infection and keep the dressing from sticking to the wound.  Have the sutures or staples removed as told by your health care provider. If skin adhesive strips were used:  Keep the wound clean and dry.  If you were given a bandage (dressing), you should change it at least one time per day or as told by your health care provider. You should also change it if it becomes dirty or wet.  Do not get the skin adhesive strips wet. You may shower or bathe, but be careful to keep the wound dry.  If the wound gets wet, pat it dry with a clean towel. Do not rub the wound.  Skin adhesive strips fall off on their own. You may trim the strips as the wound heals. Do not remove skin adhesive strips that are still stuck to the wound. They will fall off in time. If skin glue was used:  Try to keep the wound dry, but you may briefly wet it in the shower or bath. Do not soak the wound in water, such as by swimming.  After you have showered or bathed,  gently pat the wound dry with a clean towel. Do not rub the wound.  Do not do any activities that will make you sweat heavily until the skin glue has fallen off on its own.  Do not apply liquid, cream, or ointment medicine to the wound while the skin glue is in place. Using those may loosen the film before the wound has healed.  If you were given a bandage (dressing), you should change it at least one time per day or as told by your health care provider. You should also change it if it becomes dirty or wet.  If a dressing is placed over the wound, be careful not to apply tape directly over the skin glue. Doing that may cause the glue to be pulled off before the wound has healed.  Do not pick at the glue. The skin glue usually remains in place for 5-10 days, then it falls off of the skin. General Instructions  Take over-the-counter and prescription medicines only as told by your health care provider.  If you were prescribed an antibiotic medicine or ointment, take or apply it as told by your doctor. Do not stop using it even if your condition improves.  To help prevent scarring, make sure to cover your wound with sunscreen whenever you are outside after stitches are removed, after adhesive strips are removed, or when glue remains in place and the wound is healed. Make sure to wear a sunscreen of at least 30 SPF.  Do not scratch or pick at the wound.  Keep all follow-up visits as told by your health care provider. This is important.  Check your wound every day for signs of infection. Watch for:  Redness, swelling, or pain.  Fluid, blood, or pus.  Raise (elevate) the injured area above the level of your heart while you are sitting or lying down, if possible. SEEK MEDICAL CARE IF:  You received a tetanus shot and you have swelling, severe pain, redness, or bleeding at the injection site.  You have a fever.  A wound that was closed breaks open.  You notice a bad smell coming from  your wound or your dressing.  You  notice something coming out of the wound, such as wood or glass.  Your pain is not controlled with medicine.  You have increased redness, swelling, or pain at the site of your wound.  You have fluid, blood, or pus coming from your wound.  You notice a change in the color of your skin near your wound.  You need to change the dressing frequently due to fluid, blood, or pus draining from the wound.  You develop a new rash.  You develop numbness around the wound. SEEK IMMEDIATE MEDICAL CARE IF:  You develop severe swelling around the wound.  Your pain suddenly increases and is severe.  You develop painful lumps near the wound or on skin that is anywhere on your body.  You have a red streak going away from your wound.  The wound is on your hand or foot and you cannot properly move a finger or toe.  The wound is on your hand or foot and you notice that your fingers or toes look pale or bluish.   This information is not intended to replace advice given to you by your health care provider. Make sure you discuss any questions you have with your health care provider.   Document Released: 07/05/2005 Document Revised: 11/19/2014 Document Reviewed: 07/01/2014 Elsevier Interactive Patient Education [employer].

## 2015-07-31 NOTE — ED Provider Notes (Signed)
CSN: [id number]     Arrival date & time 07/31/15  1001 History   First MD Initiated Contact with Patient 07/31/15 1022     Chief Complaint  Patient presents with  . Fall      HPI  Patient presents to the patient after a fall. Patient states she was getting out of bed. She states "I'm short, and my legs are always reached the ground". She pushed forward with her hands however her toes, and the carpet. She fell forward onto her knees and her hands and then struck her head against a piece of furniture adjacent her bed. No loss of consciousness. Was able to stand from bed. Had a little bit of discomfort in her hip and shoulder. Although she is walking and using them normally now. Has some bleeding from the right temple and presents here for evaluation. No amnesia. No nausea or vomiting. No confusion.  She is accompanied by her daughter, who is a Marine scientist.  Past Medical History  Diagnosis Date  . Hyperlipidemia   . AAA (abdominal aortic aneurysm) [facility])    Past Surgical History  Procedure Laterality Date  . Hammer toe surgery  04/10/02    Left Toe  . Breast cyst aspiration  1965    Right Breast  . Nasal septum surgery  1980   Family History  Problem Relation Age of Onset  . Breast cancer    . Colon cancer    . Heart disease Mother   . Hyperlipidemia Sister   . Hyperlipidemia Brother   . Diabetes Sister   . Hyperlipidemia Sister   . Cancer Sister 65    colon  . Cancer Sister     breast  . Stroke Brother   . Hypertension Brother    Social History  Substance Use Topics  . Smoking status: Never Smoker   . Smokeless tobacco: Never Used  . Alcohol Use: Yes     Comment: social wine    OB History    No data available     Review of Systems  Constitutional: Negative for fever, chills, diaphoresis, appetite change and fatigue.  HENT: Negative for mouth sores, sore throat and trouble swallowing.   Eyes: Negative for visual disturbance.  Respiratory: Negative for cough, chest  tightness, shortness of breath and wheezing.   Cardiovascular: Negative for chest pain.  Gastrointestinal: Negative for nausea, vomiting, abdominal pain, diarrhea and abdominal distention.  Endocrine: Negative for polydipsia, polyphagia and polyuria.  Genitourinary: Negative for dysuria, frequency and hematuria.  Musculoskeletal: Positive for arthralgias. Negative for gait problem.  Skin: Positive for wound. Negative for color change, pallor and rash.  Neurological: Positive for headaches. Negative for dizziness, syncope and light-headedness.  Hematological: Does not bruise/bleed easily.  Psychiatric/Behavioral: Negative for behavioral problems and confusion.      Allergies  Penicillins and Iohexol  Home Medications   Prior to Admission medications   Medication Sig Start Date End Date Taking? Authorizing Provider  aspirin 325 MG EC tablet Take 81 mg by mouth 2 (two) times daily.     Historical Provider, MD  carbidopa-levodopa (SINEMET IR) 25-100 MG per tablet Take 1 tablet by mouth 3 (three) times daily. 03/17/15   Eustace Quail Tat, DO  Doxylamine Succinate, Sleep, (SLEEP AID PO) Take by mouth at bedtime.    Historical Provider, MD  ibuprofen (ADVIL) 200 MG tablet Take 2 tablets (400 mg total) by mouth every 6 (six) hours as needed for pain. 04/02/13   Rosalita Chessman, DO  lovastatin (MEVACOR) 20 MG tablet TAKE TWO TABLETS BY MOUTH AT BEDTIME 05/21/15   Rosalita Chessman, DO  magnesium hydroxide (PHILLIPS CHEWS) 311 MG CHEW Chew 311 mg by mouth every 4 (four) hours as needed.    Historical Provider, MD  Multiple Vitamin (MULTIVITAMIN) tablet Take 1 tablet by mouth daily.    Historical Provider, MD  traMADol (ULTRAM) 50 MG tablet Take 1 tablet (50 mg total) by mouth every 12 (twelve) hours as needed for moderate pain. 05/26/15   Percell Miller Saguier, PA-C  venlafaxine XR (EFFEXOR-XR) 75 MG 24 hr capsule TAKE 1 CAPSULE BY MOUTH DAILY. 05/12/15   Rosalita Chessman, DO  zoster vaccine live, PF, (ZOSTAVAX)  19400 UNT/0.65ML injection Inject 19,400 Units into the skin once. 03/03/15   Alferd Apa Lowne, DO   BP 131/69 mmHg  Pulse 78  Temp(Src) 99.6 F (37.6 C) (Oral)  Resp 16  Ht 5' 1.5" (1.562 m)  Wt 150 lb (68.04 kg)  BMI 27.89 kg/m2  SpO2 97%  LMP 07/19/1988 Physical Exam  Constitutional: She is oriented to person, place, and time. She appears well-developed and well-nourished. No distress.  HENT:  Head: Normocephalic.    Eyes: Conjunctivae are normal. Pupils are equal, round, and reactive to light. No scleral icterus.  Neck: Normal range of motion. Neck supple. No thyromegaly present.  Nontender over midline spine.  Cardiovascular: Normal rate and regular rhythm.  Exam reveals no gallop and no friction rub.   No murmur heard. Pulmonary/Chest: Effort normal and breath sounds normal. No respiratory distress. She has no wheezes. She has no rales.  Abdominal: Soft. Bowel sounds are normal. She exhibits no distension. There is no tenderness. There is no rebound.  Musculoskeletal: Normal range of motion.  Tenderness over the anterior aspect of the shoulder but full range of motion. Initially greater trochanter of the hip. Normal gait.  Neurological: She is alert and oriented to person, place, and time.  Skin: Skin is warm and dry. No rash noted.  Psychiatric: She has a normal mood and affect. Her behavior is normal.    ED Course  Procedures (including critical care time) Labs Review Labs Reviewed - No data to display  Imaging Review Dg Shoulder Right  07/31/2015  CLINICAL DATA:  Recent fall with right shoulder pain, initial encounter EXAM: RIGHT SHOULDER - 2+ VIEW COMPARISON:  None. FINDINGS: No acute fracture or dislocation is noted. Mild degenerative changes are noted in the acromioclavicular joint. No other focal abnormality is seen. IMPRESSION: No acute abnormality noted. Electronically Signed   By: Inez Catalina M.D.   On: 07/31/2015 11:15   Ct Head Wo Contrast  07/31/2015   CLINICAL DATA:  89s 75-year-old with right temporal pain and swelling after falling today. No loss of consciousness. EXAM: CT HEAD WITHOUT CONTRAST TECHNIQUE: Contiguous axial images were obtained from the base of the skull through the vertex without intravenous contrast. COMPARISON:  Head CT 04/10/2013. FINDINGS: There is no evidence of acute intracranial hemorrhage, mass lesion, brain edema or extra-axial fluid collection. The ventricles and subarachnoid spaces are diffusely prominent but stable. There is no CT evidence of acute cortical infarction. There is stable chronic periventricular white matter disease. Soft tissue swelling is noted in the right temporal scalp. There is mild ethmoid sinus mucosal thickening without air-fluid levels. The visualized paranasal sinuses, mastoid air cells and middle ears are otherwise clear. The calvarium is intact. IMPRESSION: 1. Right temporal scalp soft tissue injury. 2. No acute intracranial or calvarial findings. Electronically Signed  By: Richardean Sale M.D.   On: 07/31/2015 11:08   Dg Hip Unilat With Pelvis 2-3 Views Right  07/31/2015  CLINICAL DATA:  Recent fall with right hip pain, initial encounter EXAM: DG HIP (WITH OR WITHOUT PELVIS) 2-3V RIGHT COMPARISON:  None. FINDINGS: There is no evidence of hip fracture or dislocation. Mild degenerative changes of the right hip joint are noted. No soft tissue abnormality is seen. IMPRESSION: No acute abnormality noted. Electronically Signed   By: Inez Catalina M.D.   On: 07/31/2015 11:13   I have personally reviewed and evaluated these images and lab results as part of my medical decision-making.   EKG Interpretation None      MDM   Final diagnoses:  Scalp laceration, initial encounter  Head injury, initial encounter    Patient is awake and ambulatory here. Good hemostasis after laceration repair. Recheck as needed. Suture removal 7-10 days.  LACERATION REPAIR Performed by: Lolita Patella Authorized  by: Lolita Patella Consent: Verbal consent obtained. Risks and benefits: risks, benefits and alternatives were discussed Consent given by: patient Patient identity confirmed: provided demographic data Prepped and Draped in normal sterile fashion Wound explored  Laceration Location: Right temple  Laceration Length: 1cm  No Foreign Bodies seen or palpated  Anesthesia: local infiltration  Local anesthetic: lidocaine 1% c epinephrine  Anesthetic total: 4 ml  Irrigation method: syringe Amount of cleaning: standard  Skin closure: 4-0 Nylon  Number of sutures: 2  Technique: Mattress  Patient tolerance: Patient tolerated the procedure well with no immediate complications.     Tanna Furry, MD 07/31/15 1234

## 2015-07-31 NOTE — ED Notes (Signed)
Patient asked to change into a gown from waist up. 

## 2015-08-18 ENCOUNTER — Ambulatory Visit (INDEPENDENT_AMBULATORY_CARE_PROVIDER_SITE_OTHER): Payer: Medicare Other | Admitting: Family

## 2015-08-18 ENCOUNTER — Encounter: Payer: Self-pay | Admitting: Family

## 2015-08-18 VITALS — BP 150/65 | HR 91 | Temp 98.7°F | Resp 18 | Ht 62.0 in | Wt 154.8 lb

## 2015-08-18 DIAGNOSIS — N3001 Acute cystitis with hematuria: Secondary | ICD-10-CM

## 2015-08-18 DIAGNOSIS — R350 Frequency of micturition: Secondary | ICD-10-CM | POA: Diagnosis not present

## 2015-08-18 LAB — POCT URINALYSIS DIPSTICK
Bilirubin, UA: NEGATIVE
Glucose, UA: NEGATIVE
NITRITE UA: NEGATIVE
PH UA: 6
Spec Grav, UA: >=1.03
UROBILINOGEN UA: NEGATIVE

## 2015-08-18 MED ORDER — SULFAMETHOXAZOLE-TRIMETHOPRIM 800-160 MG PO TABS: 1.0000 | ORAL_TABLET | Freq: Two times a day (BID) | ORAL | Status: DC

## 2015-08-18 NOTE — Addendum Note (Signed)
Addended by: Kelle Darting A on: 08/18/2015 02:53 PM   Modules accepted: Orders

## 2015-08-18 NOTE — Patient Instructions (Signed)
Start bactrim for your urinary tract infection. Call if you see blood again in your urine. Call if your symptoms do not continue to improve.

## 2015-08-18 NOTE — Progress Notes (Signed)
Subjective:    Patient ID: Elizabeth Mccullough, female    DOB: 20-Mar-1941, 75 y.o.   MRN: [id number]  HPI   [name] is a 75 yr old female who presents today with chief complaint of urinary frequency. Reports that symptoms began this AM.  Pt had one episode of hematuria this AM. She took a dose of bactrim that she had on hand.  Symptoms improving after dose of bactrim.     Review of Systems See HPI    Past Medical History  Diagnosis Date  . Hyperlipidemia   . AAA (abdominal aortic aneurysm) [facility])     Social History   Social History  . Marital Status: Widowed    Spouse Name: N/A  . Number of Children: N/A  . Years of Education: N/A   Occupational History  . retired    Social History Main Topics  . Smoking status: Never Smoker   . Smokeless tobacco: Never Used  . Alcohol Use: Yes     Comment: social wine   . Drug Use: No  . Sexual Activity:    Partners: Male   Other Topics Concern  . Not on file   Social History Narrative   Exercise-- no    Past Surgical History  Procedure Laterality Date  . Hammer toe surgery  04/10/02    Left Toe  . Breast cyst aspiration  1965    Right Breast  . Nasal septum surgery  1980    Family History  Problem Relation Age of Onset  . Breast cancer    . Colon cancer    . Heart disease Mother   . Hyperlipidemia Sister   . Hyperlipidemia Brother   . Diabetes Sister   . Hyperlipidemia Sister   . Cancer Sister 43    colon  . Cancer Sister     breast  . Stroke Brother   . Hypertension Brother     Allergies  Allergen Reactions  . Penicillins Hives  . Iohexol Rash     Code: RASH, Desc: PATIENT STATES SHE IS ALLERGIC TO IV DYE. 20 YRS AGO SHE HAD A REACTION AT [facility], PT WAS GIVEN BENADRYL. 07/13/06/RM, Onset Date: [id number]     Current Outpatient Prescriptions on File Prior to Visit  Medication Sig Dispense Refill  . aspirin 325 MG EC tablet Take 81 mg by mouth 2 (two) times daily.     .  carbidopa-levodopa (SINEMET IR) 25-100 MG per tablet Take 1 tablet by mouth 3 (three) times daily. 90 tablet 5  . Doxylamine Succinate, Sleep, (SLEEP AID PO) Take by mouth at bedtime.    Marland Kitchen ibuprofen (ADVIL) 200 MG tablet Take 2 tablets (400 mg total) by mouth every 6 (six) hours as needed for pain. 30 tablet 0  . lovastatin (MEVACOR) 20 MG tablet TAKE TWO TABLETS BY MOUTH AT BEDTIME 180 tablet 1  . magnesium hydroxide (PHILLIPS CHEWS) 311 MG CHEW Chew 311 mg by mouth every 4 (four) hours as needed.    . Multiple Vitamin (MULTIVITAMIN) tablet Take 1 tablet by mouth daily.    . traMADol (ULTRAM) 50 MG tablet Take 1 tablet (50 mg total) by mouth every 12 (twelve) hours as needed for moderate pain. 12 tablet 0  . venlafaxine XR (EFFEXOR-XR) 75 MG 24 hr capsule TAKE 1 CAPSULE BY MOUTH DAILY. 90 capsule 1   No current facility-administered medications on file prior to visit.    BP 150/65 mmHg  Pulse 91  Temp(Src) 98.7 F (37.1  C) (Oral)  Resp 18  Ht 5\' 2"  (1.575 m)  Wt 154 lb 12.8 oz (70.217 kg)  BMI 28.31 kg/m2  SpO2 99%  LMP 07/19/1988    Objective:   Physical Exam  Constitutional: She appears well-developed and well-nourished.  Cardiovascular: Normal rate, regular rhythm and normal heart sounds.   No murmur heard. Pulmonary/Chest: Effort normal and breath sounds normal. No respiratory distress. She has no wheezes.  Abdominal: Soft. She exhibits no distension. There is no tenderness. There is no rebound and no CVA tenderness.  Psychiatric: She has a normal mood and affect. Her behavior is normal. Judgment and thought content normal.          Assessment & Plan:  UTI- Urine dip, + leuks/blood/protein. Improving on bactrim. Will continue bactrim x 5 days. Pt advised:  Start bactrim for your urinary tract infection. Call if you see blood again in your urine. Call if your symptoms do not continue to improve.

## 2015-08-18 NOTE — Progress Notes (Signed)
Pre visit review using our clinic review tool, if applicable. No additional management support is needed unless otherwise documented below in the visit note. 

## 2015-08-18 NOTE — Addendum Note (Signed)
Addended by: Kelle Darting A on: 08/18/2015 02:55 PM   Modules accepted: Orders

## 2015-08-19 ENCOUNTER — Encounter: Payer: Self-pay | Admitting: Family

## 2015-08-19 LAB — URINE CULTURE

## 2015-08-28 ENCOUNTER — Other Ambulatory Visit: Payer: Self-pay | Admitting: Family Medicine

## 2015-08-28 DIAGNOSIS — R0602 Shortness of breath: Secondary | ICD-10-CM

## 2015-08-29 ENCOUNTER — Ambulatory Visit (HOSPITAL_BASED_OUTPATIENT_CLINIC_OR_DEPARTMENT_OTHER)
Admission: RE | Admit: 2015-08-29 | Discharge: 2015-08-29 | Disposition: A | Discharge status: Home / Self Care | Payer: Medicare Other | Source: Ambulatory Visit | Attending: Family Medicine | Admitting: Family Medicine

## 2015-08-29 ENCOUNTER — Ambulatory Visit (INDEPENDENT_AMBULATORY_CARE_PROVIDER_SITE_OTHER): Payer: Medicare Other | Admitting: Family Medicine

## 2015-08-29 ENCOUNTER — Encounter: Payer: Self-pay | Admitting: Family Medicine

## 2015-08-29 VITALS — BP 134/90 | HR 77 | Temp 98.3°F | Wt 153.8 lb

## 2015-08-29 DIAGNOSIS — E785 Hyperlipidemia, unspecified: Secondary | ICD-10-CM | POA: Diagnosis not present

## 2015-08-29 DIAGNOSIS — F329 Major depressive disorder, single episode, unspecified: Secondary | ICD-10-CM | POA: Diagnosis not present

## 2015-08-29 DIAGNOSIS — R0602 Shortness of breath: Secondary | ICD-10-CM | POA: Diagnosis not present

## 2015-08-29 DIAGNOSIS — S0990XA Unspecified injury of head, initial encounter: Secondary | ICD-10-CM | POA: Diagnosis not present

## 2015-08-29 DIAGNOSIS — W19XXXA Unspecified fall, initial encounter: Secondary | ICD-10-CM | POA: Diagnosis not present

## 2015-08-29 DIAGNOSIS — F411 Generalized anxiety disorder: Secondary | ICD-10-CM

## 2015-08-29 DIAGNOSIS — F32A Depression, unspecified: Secondary | ICD-10-CM

## 2015-08-29 LAB — LIPID PANEL
CHOLESTEROL: 181 mg/dL (ref 0–200)
HDL: 50 mg/dL (ref >39.00)
LDL CALC: 113 mg/dL — AB (ref 0–99)
NONHDL: 131.26
Total CHOL/HDL Ratio: 4
Triglycerides: 92 mg/dL (ref 0.0–149.0)
VLDL: 18.4 mg/dL (ref 0.0–40.0)

## 2015-08-29 LAB — COMPREHENSIVE METABOLIC PANEL
ALT: 8 U/L (ref 0–35)
AST: 41 U/L — AB (ref 0–37)
Albumin: 4.1 g/dL (ref 3.5–5.2)
Alkaline Phosphatase: 74 U/L (ref 39–117)
BILIRUBIN TOTAL: 0.4 mg/dL (ref 0.2–1.2)
BUN: 18 mg/dL (ref 6–23)
CHLORIDE: 102 meq/L (ref 96–112)
CO2: 34 mEq/L — ABNORMAL HIGH (ref 19–32)
CREATININE: 0.81 mg/dL (ref 0.40–1.20)
Calcium: 9 mg/dL (ref 8.4–10.5)
GFR: 72.88 mL/min (ref >60.00)
GLUCOSE: 71 mg/dL (ref 70–99)
Potassium: 3.6 mEq/L (ref 3.5–5.1)
SODIUM: 142 meq/L (ref 135–145)
Total Protein: 7.4 g/dL (ref 6.0–8.3)

## 2015-08-29 LAB — TSH: TSH: 1.91 u[IU]/mL (ref 0.35–4.50)

## 2015-08-29 LAB — VITAMIN B12: VITAMIN B 12: 1274 pg/mL — AB (ref 211–911)

## 2015-08-29 MED ORDER — VENLAFAXINE HCL ER 150 MG PO CP24: 150.0000 mg | ORAL_CAPSULE | Freq: Every day | ORAL | Status: DC

## 2015-08-29 MED ORDER — ALPRAZOLAM 0.25 MG PO TABS: 0.2500 mg | ORAL_TABLET | Freq: Two times a day (BID) | ORAL | Status: DC | PRN

## 2015-08-29 MED FILL — ALPRAZolam 0.25 MG TABS: 0.25 | 10 days supply | Qty: 20 | Fill #0

## 2015-08-29 NOTE — Patient Instructions (Signed)
Fall Prevention in the Home  Falls can cause injuries and can affect people from all age groups. There are many simple things that you can do to make your home safe and to help prevent falls. WHAT CAN I DO ON THE OUTSIDE OF MY HOME?  Regularly repair the edges of walkways and driveways and fix any cracks.  Remove high doorway thresholds.  Trim any shrubbery on the main path into your home.  Use bright outdoor lighting.  Clear walkways of debris and clutter, including tools and rocks.  Regularly check that handrails are securely fastened and in good repair. Both sides of any steps should have handrails.  Install guardrails along the edges of any raised decks or porches.  Have leaves, snow, and ice cleared regularly.  Use sand or salt on walkways during winter months.  In the garage, clean up any spills right away, including grease or oil spills. WHAT CAN I DO IN THE BATHROOM?  Use night lights.  Install grab bars by the toilet and in the tub and shower. Do not use towel bars as grab bars.  Use non-skid mats or decals on the floor of the tub or shower.  If you need to sit down while you are in the shower, use a plastic, non-slip stool..  Keep the floor dry. Immediately clean up any water that spills on the floor.  Remove soap buildup in the tub or shower on a regular basis.  Attach bath mats securely with double-sided non-slip rug tape.  Remove throw rugs and other tripping hazards from the floor. WHAT CAN I DO IN THE BEDROOM?  Use night lights.  Make sure that a bedside light is easy to reach.  Do not use oversized bedding that drapes onto the floor.  Have a firm chair that has side arms to use for getting dressed.  Remove throw rugs and other tripping hazards from the floor. WHAT CAN I DO IN THE KITCHEN?   Clean up any spills right away.  Avoid walking on wet floors.  Place frequently used items in easy-to-reach places.  If you need to reach for something  above you, use a sturdy step stool that has a grab bar.  Keep electrical cables out of the way.  Do not use floor polish or wax that makes floors slippery. If you have to use wax, make sure that it is non-skid floor wax.  Remove throw rugs and other tripping hazards from the floor. WHAT CAN I DO IN THE STAIRWAYS?  Do not leave any items on the stairs.  Make sure that there are handrails on both sides of the stairs. Fix handrails that are broken or loose. Make sure that handrails are as long as the stairways.  Check any carpeting to make sure that it is firmly attached to the stairs. Fix any carpet that is loose or worn.  Avoid having throw rugs at the top or bottom of stairways, or secure the rugs with carpet tape to prevent them from moving.  Make sure that you have a light switch at the top of the stairs and the bottom of the stairs. If you do not have them, have them installed. WHAT ARE SOME OTHER FALL PREVENTION TIPS?  Wear closed-toe shoes that fit well and support your feet. Wear shoes that have rubber soles or low heels.  When you use a stepladder, make sure that it is completely opened and that the sides are firmly locked. Have someone hold the ladder while you   are using it. Do not climb a closed stepladder.  Add color or contrast paint or tape to grab bars and handrails in your home. Place contrasting color strips on the first and last steps.  Use mobility aids as needed, such as canes, walkers, scooters, and crutches.  Turn on lights if it is dark. Replace any light bulbs that burn out.  Set up furniture so that there are clear paths. Keep the furniture in the same spot.  Fix any uneven floor surfaces.  Choose a carpet design that does not hide the edge of steps of a stairway.  Be aware of any and all pets.  Review your medicines with your healthcare provider. Some medicines can cause dizziness or changes in blood pressure, which increase your risk of falling. Talk  with your health care provider about other ways that you can decrease your risk of falls. This may include working with a physical therapist or trainer to improve your strength, balance, and endurance.   This information is not intended to replace advice given to you by your health care provider. Make sure you discuss any questions you have with your health care provider.   Document Released: 06/25/2002 Document Revised: 11/19/2014 Document Reviewed: 08/09/2014 Elsevier Interactive Patient Education 2016 [employer]  

## 2015-08-29 NOTE — Progress Notes (Signed)
Pre visit review using our clinic review tool, if applicable. No additional management support is needed unless otherwise documented below in the visit note. 

## 2015-08-31 LAB — CBC WITH DIFFERENTIAL/PLATELET
BASOS ABS: 0 10*3/uL (ref 0.0–0.1)
BASOS PCT: 0.3 % (ref 0.0–3.0)
EOS ABS: 0.1 10*3/uL (ref 0.0–0.7)
Eosinophils Relative: 1.5 % (ref 0.0–5.0)
HEMATOCRIT: 39.7 % (ref 36.0–46.0)
Hemoglobin: 12 g/dL (ref 12.0–15.0)
LYMPHS ABS: 1.9 10*3/uL (ref 0.7–4.0)
LYMPHS PCT: 25.7 % (ref 12.0–46.0)
MCHC: 30.2 g/dL (ref 30.0–36.0)
MCV: 100.5 fl — AB (ref 78.0–100.0)
Monocytes Absolute: 0.5 10*3/uL (ref 0.1–1.0)
Monocytes Relative: 7.4 % (ref 3.0–12.0)
NEUTROS ABS: 4.8 10*3/uL (ref 1.4–7.7)
NEUTROS PCT: 65.1 % (ref 43.0–77.0)
PLATELETS: 429 10*3/uL — AB (ref 150.0–400.0)
RBC: 3.95 Mil/uL (ref 3.87–5.11)
RDW: 13.1 % (ref 11.5–15.5)
WBC: 7.3 10*3/uL (ref 4.0–10.5)

## 2015-09-03 DIAGNOSIS — Z9181 History of falling: Secondary | ICD-10-CM | POA: Diagnosis not present

## 2015-09-03 DIAGNOSIS — Z87440 Personal history of urinary (tract) infections: Secondary | ICD-10-CM | POA: Diagnosis not present

## 2015-09-03 DIAGNOSIS — E785 Hyperlipidemia, unspecified: Secondary | ICD-10-CM | POA: Diagnosis not present

## 2015-09-03 DIAGNOSIS — S80811D Abrasion, right lower leg, subsequent encounter: Secondary | ICD-10-CM | POA: Diagnosis not present

## 2015-09-03 DIAGNOSIS — R296 Repeated falls: Secondary | ICD-10-CM | POA: Diagnosis not present

## 2015-09-03 DIAGNOSIS — F329 Major depressive disorder, single episode, unspecified: Secondary | ICD-10-CM | POA: Diagnosis not present

## 2015-09-03 DIAGNOSIS — G20 Parkinson's disease: Secondary | ICD-10-CM | POA: Diagnosis not present

## 2015-09-03 DIAGNOSIS — S80812D Abrasion, left lower leg, subsequent encounter: Secondary | ICD-10-CM | POA: Diagnosis not present

## 2015-09-03 DIAGNOSIS — I714 Abdominal aortic aneurysm, without rupture: Secondary | ICD-10-CM | POA: Diagnosis not present

## 2015-09-03 DIAGNOSIS — Z7982 Long term (current) use of aspirin: Secondary | ICD-10-CM | POA: Diagnosis not present

## 2015-09-04 ENCOUNTER — Telehealth: Payer: Self-pay

## 2015-09-04 MED ORDER — PREDNISONE 50 MG PO TABS: ORAL_TABLET | ORAL | Status: DC

## 2015-09-04 NOTE — Telephone Encounter (Signed)
Spoke with [name] and made her aware that per protocol/due to her mother's allergy to iohexol she will need prednisone 50 mg po at 13 hours, 7 hours,a dn then 1 hour prior to the study as well as 50 mg of Benadryl 1 hour prior to study, she verbalized understanding Rx faxed     KP

## 2015-09-07 ENCOUNTER — Ambulatory Visit (HOSPITAL_BASED_OUTPATIENT_CLINIC_OR_DEPARTMENT_OTHER)
Admission: RE | Admit: 2015-09-07 | Discharge: 2015-09-07 | Disposition: A | Discharge status: Home / Self Care | Payer: Medicare Other | Source: Ambulatory Visit | Attending: Family Medicine | Admitting: Family Medicine

## 2015-09-07 DIAGNOSIS — S0990XA Unspecified injury of head, initial encounter: Secondary | ICD-10-CM

## 2015-09-07 DIAGNOSIS — W19XXXA Unspecified fall, initial encounter: Secondary | ICD-10-CM | POA: Insufficient documentation

## 2015-09-07 DIAGNOSIS — G20 Parkinson's disease: Secondary | ICD-10-CM | POA: Diagnosis not present

## 2015-09-07 DIAGNOSIS — G319 Degenerative disease of nervous system, unspecified: Secondary | ICD-10-CM | POA: Diagnosis not present

## 2015-09-07 DIAGNOSIS — I998 Other disorder of circulatory system: Secondary | ICD-10-CM | POA: Insufficient documentation

## 2015-09-08 NOTE — Progress Notes (Addendum)
Patient ID: Elizabeth Mccullough, female    DOB: 16-Jul-1941  Age: 75 y.o. MRN: [id number]    Subjective:  Subjective HPI RALONDA TARTT presents for fatigue and dizziness since fall 8 days ago.  Pt is seeing neuro for parkinsons.   Pt states she tripped over her clothes --- no witnesses, not sure if loc, no NV.     Review of Systems  Constitutional: Positive for fatigue. Negative for diaphoresis, appetite change and unexpected weight change.  Eyes: Negative for pain, redness and visual disturbance.  Respiratory: Negative for cough, chest tightness, shortness of breath and wheezing.   Cardiovascular: Negative for chest pain, palpitations and leg swelling.  Endocrine: Negative for cold intolerance, heat intolerance, polydipsia, polyphagia and polyuria.  Genitourinary: Negative for dysuria, frequency and difficulty urinating.  Neurological: Positive for dizziness. Negative for light-headedness, numbness and headaches.    History Past Medical History  Diagnosis Date  . Hyperlipidemia   . AAA (abdominal aortic aneurysm) ([place])     She has past surgical history that includes Hammer toe surgery (04/10/02); Breast cyst aspiration (1965); and Nasal septum surgery (1980).   Her family history includes Cancer in her sister; Cancer (age of onset: 21) in her sister; Diabetes in her sister; Heart disease in her mother; Hyperlipidemia in her brother, sister, and sister; Hypertension in her brother; Stroke in her brother.She reports that she has never smoked. She has never used smokeless tobacco. She reports that she drinks alcohol. She reports that she does not use illicit drugs.  Current Outpatient Prescriptions on File Prior to Visit  Medication Sig Dispense Refill  . aspirin 325 MG EC tablet Take 81 mg by mouth 2 (two) times daily.     . carbidopa-levodopa (SINEMET IR) 25-100 MG per tablet Take 1 tablet by mouth 3 (three) times daily. 90 tablet 5  . ibuprofen (ADVIL) 200 MG tablet Take 2  tablets (400 mg total) by mouth every 6 (six) hours as needed for pain. 30 tablet 0  . lovastatin (MEVACOR) 20 MG tablet TAKE TWO TABLETS BY MOUTH AT BEDTIME 180 tablet 1  . magnesium hydroxide (PHILLIPS CHEWS) 311 MG CHEW Chew 311 mg by mouth every 4 (four) hours as needed.    . Multiple Vitamin (MULTIVITAMIN) tablet Take 1 tablet by mouth daily.     No current facility-administered medications on file prior to visit.     Objective:  Objective Physical Exam  Constitutional: She is oriented to person, place, and time. She appears well-developed and well-nourished.  HENT:  Head: Normocephalic and atraumatic.  Eyes: Conjunctivae and EOM are normal.  Neck: Normal range of motion. Neck supple. No JVD present. Carotid bruit is not present. No thyromegaly present.  Cardiovascular: Normal rate, regular rhythm and normal heart sounds.   No murmur heard. Pulmonary/Chest: Effort normal and breath sounds normal. No respiratory distress. She has no wheezes. She has no rales. She exhibits no tenderness.  Musculoskeletal: She exhibits no edema.  Neurological: She is alert and oriented to person, place, and time.  Psychiatric: Her affect is blunt. Cognition and memory are impaired.  Nursing note and vitals reviewed.  BP 134/90 mmHg  Pulse 77  Temp(Src) 98.3 F (36.8 C) (Oral)  Wt 153 lb 12.8 oz (69.763 kg)  SpO2 97%  LMP 07/19/1988 Wt Readings from Last 3 Encounters:  08/29/15 153 lb 12.8 oz (69.763 kg)  08/18/15 154 lb 12.8 oz (70.217 kg)  07/31/15 150 lb (68.04 kg)     Lab Results  Component Value  Date   WBC 7.3 08/29/2015   HGB 12.0 08/29/2015   HCT 39.7 08/29/2015   PLT 429.0* 08/29/2015   GLUCOSE 71 08/29/2015   CHOL 181 08/29/2015   TRIG 92.0 08/29/2015   HDL 50.00 08/29/2015   LDLCALC 113* 08/29/2015   ALT 8 08/29/2015   AST 41* 08/29/2015   NA 142 08/29/2015   K 3.6 08/29/2015   CL 102 08/29/2015   CREATININE 0.81 08/29/2015   BUN 18 08/29/2015   CO2 34* 08/29/2015    TSH 1.91 08/29/2015   INR 1.03 02/27/2012   HGBA1C 5.5 09/04/2012   MICROALBUR 0.2 01/16/2014    Mr Brain W Wo Contrast  09/07/2015  CLINICAL DATA:  Fall 07/31/2015.  Parkinson's. EXAM: MRI HEAD WITHOUT AND WITH CONTRAST TECHNIQUE: Multiplanar, multiecho pulse sequences of the brain and surrounding structures were obtained without and with intravenous contrast. CONTRAST:  MultiHance IV COMPARISON:  CT head 07/31/2015.  MRI 04/09/2013 FINDINGS: Mild generalized atrophy.  Negative for hydrocephalus. Negative for acute infarct. Mild chronic microvascular ischemic change in the white matter. Brainstem and cerebellum normal. Mild chronic ischemia in the basal ganglia and thalamus bilaterally. Negative for hemorrhage or mass lesion. No shift of the midline structures. Negative for fluid collection. Pituitary normal in size.  Skullbase intact. Postcontrast imaging reveals normal enhancement. No enhancing mass. Normal vascular enhancement. Mild mucosal edema in the left paranasal sinuses. Left mastoid sinus effusion. IMPRESSION: Atrophy with mild chronic microvascular ischemia. No acute intracranial abnormality. Electronically Signed   By: Franchot Gallo M.D.   On: 09/07/2015 16:21     Assessment & Plan:  Plan I have discontinued Ms. Posadas's (Doxylamine Succinate, Sleep, (SLEEP AID PO)), venlafaxine XR, traMADol, and sulfamethoxazole-trimethoprim. I am also having her start on venlafaxine XR and ALPRAZolam. Additionally, I am having her maintain her aspirin, magnesium hydroxide, ibuprofen, multivitamin, carbidopa-levodopa, and lovastatin.  Meds ordered this encounter  Medications  . venlafaxine XR (EFFEXOR XR) 150 MG 24 hr capsule    Sig: Take 1 capsule (150 mg total) by mouth daily with breakfast.    Dispense:  90 capsule    Refill:  3  . ALPRAZolam (XANAX) 0.25 MG tablet    Sig: Take 1 tablet (0.25 mg total) by mouth 2 (two) times daily as needed for anxiety.    Dispense:  20 tablet    Refill:   0    Problem List Items Addressed This Visit    None    Visit Diagnoses    Depression    -  Primary    Relevant Medications    venlafaxine XR (EFFEXOR XR) 150 MG 24 hr capsule    ALPRAZolam (XANAX) 0.25 MG tablet    Other Relevant Orders    Comp Met (CMET) (Completed)    CBC with Differential/Platelet (Completed)    TSH (Completed)    Vitamin B12 (Completed)    Lipid panel (Completed)    Hyperlipidemia        Relevant Orders    Comp Met (CMET) (Completed)    CBC with Differential/Platelet (Completed)    TSH (Completed)    Vitamin B12 (Completed)    Lipid panel (Completed)    Head injury, initial encounter        Relevant Orders    MR Brain W Wo Contrast (Completed)    Falls, initial encounter        Relevant Orders    Ambulatory referral to [facility]    MR Brain W Wo Contrast (Completed)  Generalized anxiety disorder        Relevant Medications    ALPRAZolam (XANAX) 0.25 MG tablet       Follow-up: Return in about 6 months (around 02/26/2016), or if symptoms worsen or fail to improve, for falls , sob,  head injury .  [name], DO

## 2015-09-16 DIAGNOSIS — M25561 Pain in right knee: Secondary | ICD-10-CM | POA: Diagnosis not present

## 2015-09-17 ENCOUNTER — Other Ambulatory Visit: Payer: Self-pay | Admitting: Neurology

## 2015-09-17 NOTE — Telephone Encounter (Signed)
Carbidopa Levodopa 25/100 refill requested. Per last office note- patient to remain on medication. Refill approved and sent to patient's pharmacy for one month with note that patient needs appt for further refills.

## 2015-09-23 ENCOUNTER — Telehealth: Payer: Self-pay | Admitting: *Deleted

## 2015-09-23 NOTE — Telephone Encounter (Signed)
Received [facility]; forwarded to provider/SLS 03/07

## 2015-09-24 DIAGNOSIS — M25561 Pain in right knee: Secondary | ICD-10-CM | POA: Diagnosis not present

## 2015-09-27 DIAGNOSIS — S83271A Complex tear of lateral meniscus, current injury, right knee, initial encounter: Secondary | ICD-10-CM | POA: Diagnosis not present

## 2015-09-29 ENCOUNTER — Ambulatory Visit: Payer: Medicare Other | Admitting: Family Medicine

## 2015-09-30 DIAGNOSIS — S83271D Complex tear of lateral meniscus, current injury, right knee, subsequent encounter: Secondary | ICD-10-CM | POA: Diagnosis not present

## 2015-09-30 DIAGNOSIS — M94261 Chondromalacia, right knee: Secondary | ICD-10-CM | POA: Diagnosis not present

## 2015-09-30 DIAGNOSIS — S83241D Other tear of medial meniscus, current injury, right knee, subsequent encounter: Secondary | ICD-10-CM | POA: Diagnosis not present

## 2015-10-03 ENCOUNTER — Telehealth: Payer: Self-pay | Admitting: *Deleted

## 2015-10-03 NOTE — Telephone Encounter (Signed)
Received [facility]; forwarded to provider/SLS 03/17

## 2015-10-06 ENCOUNTER — Telehealth: Payer: Self-pay | Admitting: Family Medicine

## 2015-10-06 ENCOUNTER — Telehealth: Payer: Self-pay | Admitting: Neurology

## 2015-10-06 NOTE — Telephone Encounter (Signed)
Pt says that she is having surgery on 3/31. Pt says that she was advised to stop a few of her medications. Pt would like to know if it is okay to stop her [facility] Rx for a few days.   Pt would like to discuss further.    CB: [phone]

## 2015-10-06 NOTE — Telephone Encounter (Signed)
Message left to call the office.    KP 

## 2015-10-06 NOTE — Telephone Encounter (Signed)
Patient made aware about staying on medication. She will discuss with the surgeon at her preop appt.

## 2015-10-06 NOTE — Telephone Encounter (Signed)
PT called and stated she is having surgery and is questioning her medication/Dawn CB# [phone]

## 2015-10-06 NOTE — Telephone Encounter (Signed)
Please advise      KP 

## 2015-10-06 NOTE — Telephone Encounter (Signed)
Left message on machine for patient to call back.

## 2015-10-06 NOTE — Telephone Encounter (Signed)
I don't want her to d/c it at all.  Usually the surgeons even want them taking this med the AM of surgery.

## 2015-10-06 NOTE — Telephone Encounter (Signed)
She would have to be weaned of this and this can take a while to do--- if the surgeon needs this stopped cut down to 75  Mg daily for at least 2 weeks , ideally a month then 37.5

## 2015-10-06 NOTE — Telephone Encounter (Signed)
Patient called to see if it was safe to stop her Carbidopa Levodopa prior to surgery- states they haven't told her what she needs to stop. I informed patient that blood thinning medications are usually the only medications they have you stop ahead of surgery - that is she needs to stop Carbidopa Levodopa the day of surgery that would be okay because it doesn't cause problems stopping medication abruptly. She states she will make a follow up with [place] when she is recovered from knee surgery - she is scheduled on 10/17/15.

## 2015-10-08 NOTE — Telephone Encounter (Signed)
Spoke with patient and she started the 150 mg on Monday, her surgery is on 10/17/15, she has to stop it and needs to be sure she can stop it w/o having issues and she wants to also know how to restart it.  Please advise     KP

## 2015-10-08 NOTE — Telephone Encounter (Signed)
She can not stop it cold [place]-- she most likely will have trouble--- at least cut down to 75 mg and she should discuss with surgeon

## 2015-10-08 NOTE — Telephone Encounter (Signed)
Message left to call the office.    KP 

## 2015-10-09 NOTE — Telephone Encounter (Signed)
I called Sarah at [phone] ext [phone].     KP

## 2015-10-09 NOTE — Telephone Encounter (Addendum)
Spoke with Herbert Spires and she said the patient only needed to stop her NSAID's, MVI and ibuprofen. It is ok to continue the Effexor. I made the patient aware and she thanked me for calling.    KP

## 2015-10-15 DIAGNOSIS — S83241A Other tear of medial meniscus, current injury, right knee, initial encounter: Secondary | ICD-10-CM | POA: Diagnosis not present

## 2015-10-15 DIAGNOSIS — S83271A Complex tear of lateral meniscus, current injury, right knee, initial encounter: Secondary | ICD-10-CM | POA: Diagnosis not present

## 2015-10-16 ENCOUNTER — Telehealth: Payer: Self-pay | Admitting: Neurology

## 2015-10-16 MED ORDER — CARBIDOPA-LEVODOPA 25-100 MG PO TABS: 1.0000 | ORAL_TABLET | Freq: Three times a day (TID) | ORAL | Status: DC

## 2015-10-16 NOTE — Telephone Encounter (Signed)
Left message on machine for patient to call back.

## 2015-10-16 NOTE — Telephone Encounter (Signed)
Elizabeth Mccullough called and is having surgery tomorrow (10/17/15).  She has a couple questions about Her

## 2015-10-16 NOTE — Telephone Encounter (Signed)
She has questions about her carbidopa.  Please call today. [phone]

## 2015-10-16 NOTE — Telephone Encounter (Signed)
Spoke with patient and she is asking for refills of medication to get her through knee surgery- even though she is due for appt with Dr Tat. She will make appt after her surgery and refills sent in. She asked about increasing medication and I advised she would need to see Dr Tat in follow up first. She expressed understanding.

## 2015-10-17 DIAGNOSIS — M2241 Chondromalacia patellae, right knee: Secondary | ICD-10-CM | POA: Diagnosis not present

## 2015-10-17 DIAGNOSIS — G8918 Other acute postprocedural pain: Secondary | ICD-10-CM | POA: Diagnosis not present

## 2015-10-17 DIAGNOSIS — X58XXXA Exposure to other specified factors, initial encounter: Secondary | ICD-10-CM | POA: Diagnosis not present

## 2015-10-17 DIAGNOSIS — S83241A Other tear of medial meniscus, current injury, right knee, initial encounter: Secondary | ICD-10-CM | POA: Diagnosis not present

## 2015-10-17 DIAGNOSIS — M23351 Other meniscus derangements, posterior horn of lateral meniscus, right knee: Secondary | ICD-10-CM | POA: Diagnosis not present

## 2015-10-17 DIAGNOSIS — Y929 Unspecified place or not applicable: Secondary | ICD-10-CM | POA: Diagnosis not present

## 2015-10-17 DIAGNOSIS — Y939 Activity, unspecified: Secondary | ICD-10-CM | POA: Diagnosis not present

## 2015-10-17 DIAGNOSIS — S83271A Complex tear of lateral meniscus, current injury, right knee, initial encounter: Secondary | ICD-10-CM | POA: Diagnosis not present

## 2015-10-17 DIAGNOSIS — M23321 Other meniscus derangements, posterior horn of medial meniscus, right knee: Secondary | ICD-10-CM | POA: Diagnosis not present

## 2015-10-17 DIAGNOSIS — M94261 Chondromalacia, right knee: Secondary | ICD-10-CM | POA: Diagnosis not present

## 2015-10-17 DIAGNOSIS — Y999 Unspecified external cause status: Secondary | ICD-10-CM | POA: Diagnosis not present

## 2015-10-17 HISTORY — PX: KNEE SURGERY: SHX244

## 2015-11-14 ENCOUNTER — Encounter: Payer: Self-pay | Admitting: Neurology

## 2015-11-14 ENCOUNTER — Ambulatory Visit (INDEPENDENT_AMBULATORY_CARE_PROVIDER_SITE_OTHER): Payer: Medicare Other | Admitting: Neurology

## 2015-11-14 VITALS — BP 136/78 | HR 86 | Ht 61.5 in | Wt 154.0 lb

## 2015-11-14 DIAGNOSIS — G20 Parkinson's disease: Secondary | ICD-10-CM | POA: Diagnosis not present

## 2015-11-14 MED ORDER — CARBIDOPA-LEVODOPA 25-100 MG PO TABS: 1.0000 | ORAL_TABLET | Freq: Four times a day (QID) | ORAL | Status: DC

## 2015-11-14 NOTE — Progress Notes (Signed)
Elizabeth Mccullough was seen today in the movement disorders clinic for neurologic consultation at the request of Ann Held, DO.  The consultation is for the evaluation of tremor.  Prior medical records were reviewed.  She is accompanied by her friend who supplements the hx.  The pt noted that she began to have tremor about a year ago.  It has increased over the year.  I noted in med records that the pt went to the ED on 04/10/13 after a fall.  Her sandal had caught on the sidewalk and she had to have stitches in her finger.  07/05/13 update:  The patient is following up today regarding her Parkinson's disease, which was just diagnosed last visit, in October 2014.  I started her on levodopa and referred her to the Parkinson's program.  The pt reports that she is taking OTC sleep medication which is helping.  She initially had nausea with the carbidopa/levodopa but is doing much better.  She really isn't sure if the nausea was from the carbidopa/levodopa 25/100 or from the antidepressant she had started but she feels good now.  She does state that she fell once since last visit.  She says that she just "wasn't looking down" and tripped walking around a car door. She insists that she is doing markedly better.  She started driving again.  "I feel almost normal."  She was supposed to go to physical therapy since our last visit, but admits that she had the initial evaluation and 2 subsequent visits, but this was when she was very nauseated and just could not tolerate the therapy.  She really is not doing any outside exercise. I did review records from other physicians since her last visit.  She had a lower extremity venous Doppler that did not demonstrate DVT but did demonstrate a rather extensive superficial thrombus that extended from above the knee to almost the ankle.  11/06/13 update:  Pt is on carbidopa/levodopa 25/100 three times per day.  She is feeling better.  No hallucinations.  No falls.  "I  can walk, I can write, my mood is good."  She is tolerating the effexor well.  She is not exercising.    05/06/14 update:  Pt is f/u today re: PD. She is on carbidopa/levodopa 25/100 tid (7am/12pm/5pm).  She is on effexor for her mood.  She is feeling well.  She reports a few falls since last visit.  One was at her sons house.  She was going up the stairs and was carrying food and fell on her knees.  She had one other minor fall.  No hallucinations.  No lightheadedness or near syncope.  She is not exercising.  She notes that she has some trouble raising the R arm.   02/20/15 update:  The patient is following up today.  I have not seen her since October, 2015.  She had an appointment in April that she canceled because she reported that she was moving.  She is currently on carbidopa/levodopa 25/100, one tablet 3 times per day.   She is tolerating that well.  She remains on Effexor for anxiety and depression.  I reviewed records available to me since last visit.  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  She does not need a follow-up with them for another year and a half.  She is not exercising faithfully.  She does have some tremor of the L hand if nervous or if she is trying  to put on eyebrow pencil.  Wearing off:  No.  How long before next dose:  n/a Falls:   Yes.  , last week; fell over a pillow on the floor; did not get hurt N/V:  No. Hallucinations:  No.  visual distortions: No. Lightheaded:  No.  Syncope: No. Dyskinesia:  No.  11/14/15 update:  The patient is following up today.  I have not seen her since August, 2016.  She has a history of Parkinson's disease and is on carbidopa/levodopa 25/100, one tablet 3 times per day.  More tremor when she drives.  No lightheadedness.  No hallucinations.  She does have a history of depression and is on Effexor.  At our last visit, she reported that she was off of benzodiazepines, but states today that she is back on Xanax.  States that she really isn't taking that.  States that she only takes it if she goes on plane, etc. I did have the opportunity to review records since last visit.  She went to the emergency room on January 12 after having a fall.  She was attempting to get on the bed and fell and hit her head.  This did require stitches.  States that she had about 3 falls in total.  She is not   PREVIOUS MEDICATIONS: none to date  ALLERGIES:   Allergies  Allergen Reactions  . Penicillins Hives  . Iohexol Rash     Code: RASH, Desc: PATIENT STATES SHE IS ALLERGIC TO IV DYE. 20 YRS AGO SHE HAD A REACTION AT [facility], PT WAS GIVEN BENADRYL. 07/13/06/RM, Onset Date: [id number]     CURRENT MEDICATIONS:  Current Outpatient Prescriptions on File Prior to Visit  Medication Sig Dispense Refill  . ALPRAZolam (XANAX) 0.25 MG tablet Take 1 tablet (0.25 mg total) by mouth 2 (two) times daily as needed for anxiety. 20 tablet 0  . aspirin 325 MG EC tablet Take 325 mg by mouth 2 (two) times daily.     . carbidopa-levodopa (SINEMET IR) 25-100 MG tablet Take 1 tablet by mouth 3 (three) times daily. 90 tablet 1  . ibuprofen (ADVIL) 200 MG tablet Take 2 tablets (400 mg total) by mouth every 6 (six) hours as needed for pain. 30 tablet 0  . lovastatin (MEVACOR) 20 MG tablet TAKE TWO TABLETS BY MOUTH AT BEDTIME 180 tablet 1  . magnesium hydroxide (PHILLIPS CHEWS) 311 MG CHEW Chew 311 mg by mouth every 4 (four) hours as needed.    . Multiple Vitamin (MULTIVITAMIN) tablet Take 1 tablet by mouth daily.    Marland Kitchen venlafaxine XR (EFFEXOR XR) 150 MG 24 hr capsule Take 1 capsule (150 mg total) by mouth daily with breakfast. 90 capsule 3   No current facility-administered medications on file prior to visit.    PAST MEDICAL HISTORY:   Past Medical History  Diagnosis Date  . Hyperlipidemia   . AAA (abdominal aortic aneurysm) ([place])     PAST SURGICAL HISTORY:   Past Surgical History  Procedure Laterality Date  . Hammer toe surgery   04/10/02    Left Toe  . Breast cyst aspiration  1965    Right Breast  . Nasal septum surgery  1980  . Knee surgery Right 10/17/15    meniscus repair    SOCIAL HISTORY:   Social History   Social History  . Marital Status: Widowed    Spouse Name: N/A  . Number of Children: N/A  . Years of Education: N/A   Occupational History  .  retired    Social History Main Topics  . Smoking status: Never Smoker   . Smokeless tobacco: Never Used  . Alcohol Use: Yes     Comment: social wine   . Drug Use: No  . Sexual Activity:    Partners: Male   Other Topics Concern  . Not on file   Social History Narrative   Exercise-- no    FAMILY HISTORY:   Family Status  Relation Status Death Age  . Mother Deceased 58    chf  . Father Deceased 40    addisons  . Sister Alive     22  . Brother Deceased birth  . Sister Alive     51  . Sister Deceased   . Sister Deceased 76  . Brother Deceased   . Brother Deceased 1    bleeding ulcer, heart  . Brother Deceased 2    pneumonia    ROS:  A complete 10 system review of systems was obtained and was unremarkable apart from what is mentioned above.  PHYSICAL EXAMINATION:    VITALS:   Filed Vitals:   11/14/15 1117  BP: 136/78  Pulse: 86  Height: 5' 1.5" (1.562 m)  Weight: 154 lb (69.854 kg)   Wt Readings from Last 3 Encounters:  11/14/15 154 lb (69.854 kg)  08/29/15 153 lb 12.8 oz (69.763 kg)  08/18/15 154 lb 12.8 oz (70.217 kg)    GEN:  The patient appears stated age and is in NAD. HEENT:  Normocephalic, atraumatic.  The mucous membranes are moist. The superficial temporal arteries are without ropiness or tenderness. CV:  RRR Lungs:  CTAB Neck/HEME:  There are no carotid bruits bilaterally.  Neurological examination:  Orientation: The patient is alert and oriented x3.  Cranial nerves: There is good facial symmetry.  No significant facial hypomimia.  The speech is fluent and clear.  It is hypophonic.  The patient is able to  make the gutteral sounds without difficulty.  Soft palate rises symmetrically and there is no tongue deviation. Hearing is intact to conversational tone. Sensation: Sensation is intact to light throughout. Motor: Strength is 5/5 in the bilateral upper and lower extremities.   Shoulder shrug is equal and symmetric.  There is no pronator drift.  Movement examination: Tone: There is mild-mod increased tone in the right upper extremity.  The left is normal. Abnormal movements: There is no resting tremor even with distraction procedures. Coordination:  There is some decremation with finger taps bilaterally; all other RAMs are good. Gait and Station: The patient has no difficulty arising out of a deep-seated chair without the use of the hands.  She was able to arise on the first attempt.  She does have decreased arm swing on the right with camptocormia to the right and increase in RUE tremor with ambulation.  Negative pull test.    ASSESSMENT/PLAN:  1.  Parkinson's disease, diagnosed in 04/20/2013.  -She will remain on carbidopa/levodopa 25/100, increase to one tablet four times a day at 7/11/3/7.      -talked to her about compliance with f/u visits.  Told her would not continue to RF medication beyond 6 months.  Told her that i would prefer to see her every 3-4 months but she refused.  -I encouraged her again to exercise.  Greater than 50% of 25 min visit in counseling. 2.  Anxiety and ? Depression  -She is doing much better on Effexor and states that dose was recently increase 3.  F/u  in 6 months, sooner should new neurologic issues arise.

## 2015-11-14 NOTE — Patient Instructions (Signed)
1. Follow up 5-6 months.

## 2015-12-24 DIAGNOSIS — S83271D Complex tear of lateral meniscus, current injury, right knee, subsequent encounter: Secondary | ICD-10-CM | POA: Diagnosis not present

## 2015-12-24 DIAGNOSIS — Z4789 Encounter for other orthopedic aftercare: Secondary | ICD-10-CM | POA: Diagnosis not present

## 2016-01-05 DIAGNOSIS — Z1231 Encounter for screening mammogram for malignant neoplasm of breast: Secondary | ICD-10-CM | POA: Diagnosis not present

## 2016-01-05 DIAGNOSIS — Z803 Family history of malignant neoplasm of breast: Secondary | ICD-10-CM | POA: Diagnosis not present

## 2016-01-05 LAB — HM MAMMOGRAPHY

## 2016-01-08 DIAGNOSIS — H5203 Hypermetropia, bilateral: Secondary | ICD-10-CM | POA: Diagnosis not present

## 2016-01-14 ENCOUNTER — Encounter: Payer: Self-pay | Admitting: Family Medicine

## 2016-02-06 ENCOUNTER — Other Ambulatory Visit: Payer: Self-pay | Admitting: Family Medicine

## 2016-02-19 DIAGNOSIS — S83271D Complex tear of lateral meniscus, current injury, right knee, subsequent encounter: Secondary | ICD-10-CM | POA: Diagnosis not present

## 2016-02-19 DIAGNOSIS — Z4789 Encounter for other orthopedic aftercare: Secondary | ICD-10-CM | POA: Diagnosis not present

## 2016-03-05 ENCOUNTER — Telehealth: Payer: Self-pay | Admitting: Family Medicine

## 2016-03-05 ENCOUNTER — Ambulatory Visit (INDEPENDENT_AMBULATORY_CARE_PROVIDER_SITE_OTHER): Payer: Medicare Other | Admitting: Family Medicine

## 2016-03-05 ENCOUNTER — Other Ambulatory Visit: Payer: Self-pay | Admitting: Family Medicine

## 2016-03-05 DIAGNOSIS — F411 Generalized anxiety disorder: Secondary | ICD-10-CM

## 2016-03-05 DIAGNOSIS — E2839 Other primary ovarian failure: Secondary | ICD-10-CM

## 2016-03-05 DIAGNOSIS — G20 Parkinson's disease: Secondary | ICD-10-CM

## 2016-03-05 DIAGNOSIS — F329 Major depressive disorder, single episode, unspecified: Secondary | ICD-10-CM

## 2016-03-05 DIAGNOSIS — F32A Depression, unspecified: Secondary | ICD-10-CM

## 2016-03-05 DIAGNOSIS — E785 Hyperlipidemia, unspecified: Secondary | ICD-10-CM | POA: Diagnosis not present

## 2016-03-05 DIAGNOSIS — Z0000 Encounter for general adult medical examination without abnormal findings: Secondary | ICD-10-CM

## 2016-03-05 LAB — CBC WITH DIFFERENTIAL/PLATELET
BASOS ABS: 0 10*3/uL (ref 0.0–0.1)
Basophils Relative: 0.3 % (ref 0.0–3.0)
Eosinophils Absolute: 0.1 10*3/uL (ref 0.0–0.7)
Eosinophils Relative: 1.4 % (ref 0.0–5.0)
HCT: 36.6 % (ref 36.0–46.0)
Hemoglobin: 12.4 g/dL (ref 12.0–15.0)
LYMPHS ABS: 1.7 10*3/uL (ref 0.7–4.0)
Lymphocytes Relative: 26.3 % (ref 12.0–46.0)
MCHC: 34 g/dL (ref 30.0–36.0)
MCV: 93.2 fl (ref 78.0–100.0)
MONO ABS: 0.3 10*3/uL (ref 0.1–1.0)
MONOS PCT: 5.2 % (ref 3.0–12.0)
NEUTROS ABS: 4.4 10*3/uL (ref 1.4–7.7)
NEUTROS PCT: 66.8 % (ref 43.0–77.0)
PLATELETS: 337 10*3/uL (ref 150.0–400.0)
RBC: 3.92 Mil/uL (ref 3.87–5.11)
RDW: 13.9 % (ref 11.5–15.5)
WBC: 6.6 10*3/uL (ref 4.0–10.5)

## 2016-03-05 LAB — LIPID PANEL
Cholesterol: 190 mg/dL (ref 0–200)
HDL: 64.8 mg/dL (ref >39.00)
LDL CALC: 110 mg/dL — AB (ref 0–99)
NONHDL: 125.05
Total CHOL/HDL Ratio: 3
Triglycerides: 77 mg/dL (ref 0.0–149.0)
VLDL: 15.4 mg/dL (ref 0.0–40.0)

## 2016-03-05 LAB — COMPREHENSIVE METABOLIC PANEL
ALT: 6 U/L (ref 0–35)
AST: 19 U/L (ref 0–37)
Albumin: 4.4 g/dL (ref 3.5–5.2)
Alkaline Phosphatase: 72 U/L (ref 39–117)
BUN: 19 mg/dL (ref 6–23)
CHLORIDE: 102 meq/L (ref 96–112)
CO2: 35 meq/L — AB (ref 19–32)
Calcium: 9.3 mg/dL (ref 8.4–10.5)
Creatinine, Ser: 0.77 mg/dL (ref 0.40–1.20)
GFR: 77.16 mL/min (ref >60.00)
GLUCOSE: 101 mg/dL — AB (ref 70–99)
POTASSIUM: 3.6 meq/L (ref 3.5–5.1)
SODIUM: 142 meq/L (ref 135–145)
Total Bilirubin: 0.6 mg/dL (ref 0.2–1.2)
Total Protein: 7.3 g/dL (ref 6.0–8.3)

## 2016-03-05 LAB — POCT URINALYSIS DIPSTICK
BILIRUBIN UA: NEGATIVE
Blood, UA: NEGATIVE
Glucose, UA: NEGATIVE
KETONES UA: NEGATIVE
Leukocytes, UA: NEGATIVE
Nitrite, UA: NEGATIVE
PH UA: 7.5
PROTEIN UA: NEGATIVE
Spec Grav, UA: 1.015
Urobilinogen, UA: 1

## 2016-03-05 MED ORDER — VENLAFAXINE HCL ER 75 MG PO CP24: 75.0000 mg | ORAL_CAPSULE | Freq: Every day | ORAL | 5 refills | Status: DC

## 2016-03-05 NOTE — Patient Instructions (Signed)
Preventive Care for Adults, Female A healthy lifestyle and preventive care can promote health and wellness. Preventive health guidelines for women include the following key practices.  A routine yearly physical is a good way to check with your health care provider about your health and preventive screening. It is a chance to share any concerns and updates on your health and to receive a thorough exam.  Visit your dentist for a routine exam and preventive care every 6 months. Brush your teeth twice a day and floss once a day. Good oral hygiene prevents tooth decay and gum disease.  The frequency of eye exams is based on your age, health, family medical history, use of contact lenses, and other factors. Follow your health care provider's recommendations for frequency of eye exams.  Eat a healthy diet. Foods like vegetables, fruits, whole grains, low-fat dairy products, and lean protein foods contain the nutrients you need without too many calories. Decrease your intake of foods high in solid fats, added sugars, and salt. Eat the right amount of calories for you.Get information about a proper diet from your health care provider, if necessary.  Regular physical exercise is one of the most important things you can do for your health. Most adults should get at least 150 minutes of moderate-intensity exercise (any activity that increases your heart rate and causes you to sweat) each week. In addition, most adults need muscle-strengthening exercises on 2 or more days a week.  Maintain a healthy weight. The body mass index (BMI) is a screening tool to identify possible weight problems. It provides an estimate of body fat based on height and weight. Your health care provider can find your BMI and can help you achieve or maintain a healthy weight.For adults 20 years and older:  A BMI below 18.5 is considered underweight.  A BMI of 18.5 to 24.9 is normal.  A BMI of 25 to 29.9 is considered overweight.  A  BMI of 30 and above is considered obese.  Maintain normal blood lipids and cholesterol levels by exercising and minimizing your intake of saturated fat. Eat a balanced diet with plenty of fruit and vegetables. Blood tests for lipids and cholesterol should begin at age 45 and be repeated every 5 years. If your lipid or cholesterol levels are high, you are over 50, or you are at high risk for heart disease, you may need your cholesterol levels checked more frequently.Ongoing high lipid and cholesterol levels should be treated with medicines if diet and exercise are not working.  If you smoke, find out from your health care provider how to quit. If you do not use tobacco, do not start.  Lung cancer screening is recommended for adults aged 45-80 years who are at high risk for developing lung cancer because of a history of smoking. A yearly low-dose CT scan of the lungs is recommended for people who have at least a 30-pack-year history of smoking and are a current smoker or have quit within the past 15 years. A pack year of smoking is smoking an average of 1 pack of cigarettes a day for 1 year (for example: 1 pack a day for 30 years or 2 packs a day for 15 years). Yearly screening should continue until the smoker has stopped smoking for at least 15 years. Yearly screening should be stopped for people who develop a health problem that would prevent them from having lung cancer treatment.  If you are pregnant, do not drink alcohol. If you are  breastfeeding, be very cautious about drinking alcohol. If you are not pregnant and choose to drink alcohol, do not have more than 1 drink per day. One drink is considered to be 12 ounces (355 mL) of beer, 5 ounces (148 mL) of wine, or 1.5 ounces (44 mL) of liquor.  Avoid use of street drugs. Do not share needles with anyone. Ask for help if you need support or instructions about stopping the use of drugs.  High blood pressure causes heart disease and increases the risk  of stroke. Your blood pressure should be checked at least every 1 to 2 years. Ongoing high blood pressure should be treated with medicines if weight loss and exercise do not work.  If you are 55-79 years old, ask your health care provider if you should take aspirin to prevent strokes.  Diabetes screening is done by taking a blood sample to check your blood glucose level after you have not eaten for a certain period of time (fasting). If you are not overweight and you do not have risk factors for diabetes, you should be screened once every 3 years starting at age 45. If you are overweight or obese and you are 40-70 years of age, you should be screened for diabetes every year as part of your cardiovascular risk assessment.  Breast cancer screening is essential preventive care for women. You should practice "breast self-awareness." This means understanding the normal appearance and feel of your breasts and may include breast self-examination. Any changes detected, no matter how small, should be reported to a health care provider. Women in their 20s and 30s should have a clinical breast exam (CBE) by a health care provider as part of a regular health exam every 1 to 3 years. After age 40, women should have a CBE every year. Starting at age 40, women should consider having a mammogram (breast X-ray test) every year. Women who have a family history of breast cancer should talk to their health care provider about genetic screening. Women at a high risk of breast cancer should talk to their health care providers about having an MRI and a mammogram every year.  Breast cancer gene (BRCA)-related cancer risk assessment is recommended for women who have family members with BRCA-related cancers. BRCA-related cancers include breast, ovarian, tubal, and peritoneal cancers. Having family members with these cancers may be associated with an increased risk for harmful changes (mutations) in the breast cancer genes BRCA1 and  BRCA2. Results of the assessment will determine the need for genetic counseling and BRCA1 and BRCA2 testing.  Your health care provider may recommend that you be screened regularly for cancer of the pelvic organs (ovaries, uterus, and vagina). This screening involves a pelvic examination, including checking for microscopic changes to the surface of your cervix (Pap test). You may be encouraged to have this screening done every 3 years, beginning at age 21.  For women ages 30-65, health care providers may recommend pelvic exams and Pap testing every 3 years, or they may recommend the Pap and pelvic exam, combined with testing for human papilloma virus (HPV), every 5 years. Some types of HPV increase your risk of cervical cancer. Testing for HPV may also be done on women of any age with unclear Pap test results.  Other health care providers may not recommend any screening for nonpregnant women who are considered low risk for pelvic cancer and who do not have symptoms. Ask your health care provider if a screening pelvic exam is right for   you.  If you have had past treatment for cervical cancer or a condition that could lead to cancer, you need Pap tests and screening for cancer for at least 20 years after your treatment. If Pap tests have been discontinued, your risk factors (such as having a new sexual partner) need to be reassessed to determine if screening should resume. Some women have medical problems that increase the chance of getting cervical cancer. In these cases, your health care provider may recommend more frequent screening and Pap tests.  Colorectal cancer can be detected and often prevented. Most routine colorectal cancer screening begins at the age of 50 years and continues through age 75 years. However, your health care provider may recommend screening at an earlier age if you have risk factors for colon cancer. On a yearly basis, your health care provider may provide home test kits to check  for hidden blood in the stool. Use of a small camera at the end of a tube, to directly examine the colon (sigmoidoscopy or colonoscopy), can detect the earliest forms of colorectal cancer. Talk to your health care provider about this at age 50, when routine screening begins. Direct exam of the colon should be repeated every 5-10 years through age 75 years, unless early forms of precancerous polyps or small growths are found.  People who are at an increased risk for hepatitis B should be screened for this virus. You are considered at high risk for hepatitis B if:  You were born in a country where hepatitis B occurs often. Talk with your health care provider about which countries are considered high risk.  Your parents were born in a high-risk country and you have not received a shot to protect against hepatitis B (hepatitis B vaccine).  You have HIV or AIDS.  You use needles to inject street drugs.  You live with, or have sex with, someone who has hepatitis B.  You get hemodialysis treatment.  You take certain medicines for conditions like cancer, organ transplantation, and autoimmune conditions.  Hepatitis C blood testing is recommended for all people born from 1945 through 1965 and any individual with known risks for hepatitis C.  Practice safe sex. Use condoms and avoid high-risk sexual practices to reduce the spread of sexually transmitted infections (STIs). STIs include gonorrhea, chlamydia, syphilis, trichomonas, herpes, HPV, and human immunodeficiency virus (HIV). Herpes, HIV, and HPV are viral illnesses that have no cure. They can result in disability, cancer, and death.  You should be screened for sexually transmitted illnesses (STIs) including gonorrhea and chlamydia if:  You are sexually active and are younger than 24 years.  You are older than 24 years and your health care provider tells you that you are at risk for this type of infection.  Your sexual activity has changed  since you were last screened and you are at an increased risk for chlamydia or gonorrhea. Ask your health care provider if you are at risk.  If you are at risk of being infected with HIV, it is recommended that you take a prescription medicine daily to prevent HIV infection. This is called preexposure prophylaxis (PrEP). You are considered at risk if:  You are sexually active and do not regularly use condoms or know the HIV status of your partner(s).  You take drugs by injection.  You are sexually active with a partner who has HIV.  Talk with your health care provider about whether you are at high risk of being infected with HIV. If   you choose to begin PrEP, you should first be tested for HIV. You should then be tested every 3 months for as long as you are taking PrEP.  Osteoporosis is a disease in which the bones lose minerals and strength with aging. This can result in serious bone fractures or breaks. The risk of osteoporosis can be identified using a bone density scan. Women ages 67 years and over and women at risk for fractures or osteoporosis should discuss screening with their health care providers. Ask your health care provider whether you should take a calcium supplement or vitamin D to reduce the rate of osteoporosis.  Menopause can be associated with physical symptoms and risks. Hormone replacement therapy is available to decrease symptoms and risks. You should talk to your health care provider about whether hormone replacement therapy is right for you.  Use sunscreen. Apply sunscreen liberally and repeatedly throughout the day. You should seek shade when your shadow is shorter than you. Protect yourself by wearing long sleeves, pants, a wide-brimmed hat, and sunglasses year round, whenever you are outdoors.  Once a month, do a whole body skin exam, using a mirror to look at the skin on your back. Tell your health care provider of new moles, moles that have irregular borders, moles that  are larger than a pencil eraser, or moles that have changed in shape or color.  Stay current with required vaccines (immunizations).  Influenza vaccine. All adults should be immunized every year.  Tetanus, diphtheria, and acellular pertussis (Td, Tdap) vaccine. Pregnant women should receive 1 dose of Tdap vaccine during each pregnancy. The dose should be obtained regardless of the length of time since the last dose. Immunization is preferred during the 27th-36th week of gestation. An adult who has not previously received Tdap or who does not know her vaccine status should receive 1 dose of Tdap. This initial dose should be followed by tetanus and diphtheria toxoids (Td) booster doses every 10 years. Adults with an unknown or incomplete history of completing a 3-dose immunization series with Td-containing vaccines should begin or complete a primary immunization series including a Tdap dose. Adults should receive a Td booster every 10 years.  Varicella vaccine. An adult without evidence of immunity to varicella should receive 2 doses or a second dose if she has previously received 1 dose. Pregnant females who do not have evidence of immunity should receive the first dose after pregnancy. This first dose should be obtained before leaving the health care facility. The second dose should be obtained 4-8 weeks after the first dose.  Human papillomavirus (HPV) vaccine. Females aged 13-26 years who have not received the vaccine previously should obtain the 3-dose series. The vaccine is not recommended for use in pregnant females. However, pregnancy testing is not needed before receiving a dose. If a female is found to be pregnant after receiving a dose, no treatment is needed. In that case, the remaining doses should be delayed until after the pregnancy. Immunization is recommended for any person with an immunocompromised condition through the age of 61 years if she did not get any or all doses earlier. During the  3-dose series, the second dose should be obtained 4-8 weeks after the first dose. The third dose should be obtained 24 weeks after the first dose and 16 weeks after the second dose.  Zoster vaccine. One dose is recommended for adults aged 30 years or older unless certain conditions are present.  Measles, mumps, and rubella (MMR) vaccine. Adults born  before 1957 generally are considered immune to measles and mumps. Adults born in 1957 or later should have 1 or more doses of MMR vaccine unless there is a contraindication to the vaccine or there is laboratory evidence of immunity to each of the three diseases. A routine second dose of MMR vaccine should be obtained at least 28 days after the first dose for students attending postsecondary schools, health care workers, or international travelers. People who received inactivated measles vaccine or an unknown type of measles vaccine during 1963-1967 should receive 2 doses of MMR vaccine. People who received inactivated mumps vaccine or an unknown type of mumps vaccine before 1979 and are at high risk for mumps infection should consider immunization with 2 doses of MMR vaccine. For females of childbearing age, rubella immunity should be determined. If there is no evidence of immunity, females who are not pregnant should be vaccinated. If there is no evidence of immunity, females who are pregnant should delay immunization until after pregnancy. Unvaccinated health care workers born before 1957 who lack laboratory evidence of measles, mumps, or rubella immunity or laboratory confirmation of disease should consider measles and mumps immunization with 2 doses of MMR vaccine or rubella immunization with 1 dose of MMR vaccine.  Pneumococcal 13-valent conjugate (PCV13) vaccine. When indicated, a person who is uncertain of his immunization history and has no record of immunization should receive the PCV13 vaccine. All adults 65 years of age and older should receive this  vaccine. An adult aged 19 years or older who has certain medical conditions and has not been previously immunized should receive 1 dose of PCV13 vaccine. This PCV13 should be followed with a dose of pneumococcal polysaccharide (PPSV23) vaccine. Adults who are at high risk for pneumococcal disease should obtain the PPSV23 vaccine at least 8 weeks after the dose of PCV13 vaccine. Adults older than 77 years of age who have normal immune system function should obtain the PPSV23 vaccine dose at least 1 year after the dose of PCV13 vaccine.  Pneumococcal polysaccharide (PPSV23) vaccine. When PCV13 is also indicated, PCV13 should be obtained first. All adults aged 65 years and older should be immunized. An adult younger than age 65 years who has certain medical conditions should be immunized. Any person who resides in a nursing home or long-term care facility should be immunized. An adult smoker should be immunized. People with an immunocompromised condition and certain other conditions should receive both PCV13 and PPSV23 vaccines. People with human immunodeficiency virus (HIV) infection should be immunized as soon as possible after diagnosis. Immunization during chemotherapy or radiation therapy should be avoided. Routine use of PPSV23 vaccine is not recommended for American Indians, [place] Natives, or people younger than 65 years unless there are medical conditions that require PPSV23 vaccine. When indicated, people who have unknown immunization and have no record of immunization should receive PPSV23 vaccine. One-time revaccination 5 years after the first dose of PPSV23 is recommended for people aged 19-64 years who have chronic kidney failure, nephrotic syndrome, asplenia, or immunocompromised conditions. People who received 1-2 doses of PPSV23 before age 65 years should receive another dose of PPSV23 vaccine at age 65 years or later if at least 5 years have passed since the previous dose. Doses of PPSV23 are not  needed for people immunized with PPSV23 at or after age 65 years.  Meningococcal vaccine. Adults with asplenia or persistent complement component deficiencies should receive 2 doses of quadrivalent meningococcal conjugate (MenACWY-D) vaccine. The doses should be obtained   at least 2 months apart. Microbiologists working with certain meningococcal bacteria, [place] recruits, people at risk during an outbreak, and people who travel to or live in countries with a high rate of meningitis should be immunized. A first-year college student up through age 34 years who is living in a residence hall should receive a dose if she did not receive a dose on or after her 16th birthday. Adults who have certain high-risk conditions should receive one or more doses of vaccine.  Hepatitis A vaccine. Adults who wish to be protected from this disease, have certain high-risk conditions, work with hepatitis A-infected animals, work in hepatitis A research labs, or travel to or work in countries with a high rate of hepatitis A should be immunized. Adults who were previously unvaccinated and who anticipate close contact with an international adoptee during the first 60 days after arrival in the [place] from a country with a high rate of hepatitis A should be immunized.  Hepatitis B vaccine. Adults who wish to be protected from this disease, have certain high-risk conditions, may be exposed to blood or other infectious body fluids, are household contacts or sex partners of hepatitis B positive people, are clients or workers in certain care facilities, or travel to or work in countries with a high rate of hepatitis B should be immunized.  Haemophilus influenzae type b (Hib) vaccine. A previously unvaccinated person with asplenia or sickle cell disease or having a scheduled splenectomy should receive 1 dose of Hib vaccine. Regardless of previous immunization, a recipient of a hematopoietic stem cell transplant should receive a  3-dose series 6-12 months after her successful transplant. Hib vaccine is not recommended for adults with HIV infection. Preventive Services / Frequency Ages 35 to 4 years  Blood pressure check.** / Every 3-5 years.  Lipid and cholesterol check.** / Every 5 years beginning at age 60.  Clinical breast exam.** / Every 3 years for women in their 71s and 10s.  BRCA-related cancer risk assessment.** / For women who have family members with a BRCA-related cancer (breast, ovarian, tubal, or peritoneal cancers).  Pap test.** / Every 2 years from ages 76 through 26. Every 3 years starting at age 61 through age 76 or 93 with a history of 3 consecutive normal Pap tests.  HPV screening.** / Every 3 years from ages 37 through ages 60 to 51 with a history of 3 consecutive normal Pap tests.  Hepatitis C blood test.** / For any individual with known risks for hepatitis C.  Skin self-exam. / Monthly.  Influenza vaccine. / Every year.  Tetanus, diphtheria, and acellular pertussis (Tdap, Td) vaccine.** / Consult your health care provider. Pregnant women should receive 1 dose of Tdap vaccine during each pregnancy. 1 dose of Td every 10 years.  Varicella vaccine.** / Consult your health care provider. Pregnant females who do not have evidence of immunity should receive the first dose after pregnancy.  HPV vaccine. / 3 doses over 6 months, if 93 and younger. The vaccine is not recommended for use in pregnant females. However, pregnancy testing is not needed before receiving a dose.  Measles, mumps, rubella (MMR) vaccine.** / You need at least 1 dose of MMR if you were born in 1957 or later. You may also need a 2nd dose. For females of childbearing age, rubella immunity should be determined. If there is no evidence of immunity, females who are not pregnant should be vaccinated. If there is no evidence of immunity, females who are  pregnant should delay immunization until after pregnancy.  Pneumococcal  13-valent conjugate (PCV13) vaccine.** / Consult your health care provider.  Pneumococcal polysaccharide (PPSV23) vaccine.** / 1 to 2 doses if you smoke cigarettes or if you have certain conditions.  Meningococcal vaccine.** / 1 dose if you are age 68 to 8 years and a Market researcher living in a residence hall, or have one of several medical conditions, you need to get vaccinated against meningococcal disease. You may also need additional booster doses.  Hepatitis A vaccine.** / Consult your health care provider.  Hepatitis B vaccine.** / Consult your health care provider.  Haemophilus influenzae type b (Hib) vaccine.** / Consult your health care provider. Ages 7 to 53 years  Blood pressure check.** / Every year.  Lipid and cholesterol check.** / Every 5 years beginning at age 25 years.  Lung cancer screening. / Every year if you are aged 11-80 years and have a 30-pack-year history of smoking and currently smoke or have quit within the past 15 years. Yearly screening is stopped once you have quit smoking for at least 15 years or develop a health problem that would prevent you from having lung cancer treatment.  Clinical breast exam.** / Every year after age 48 years.  BRCA-related cancer risk assessment.** / For women who have family members with a BRCA-related cancer (breast, ovarian, tubal, or peritoneal cancers).  Mammogram.** / Every year beginning at age 41 years and continuing for as long as you are in good health. Consult with your health care provider.  Pap test.** / Every 3 years starting at age 65 years through age 37 or 70 years with a history of 3 consecutive normal Pap tests.  HPV screening.** / Every 3 years from ages 72 years through ages 60 to 40 years with a history of 3 consecutive normal Pap tests.  Fecal occult blood test (FOBT) of stool. / Every year beginning at age 21 years and continuing until age 5 years. You may not need to do this test if you get  a colonoscopy every 10 years.  Flexible sigmoidoscopy or colonoscopy.** / Every 5 years for a flexible sigmoidoscopy or every 10 years for a colonoscopy beginning at age 35 years and continuing until age 48 years.  Hepatitis C blood test.** / For all people born from [birth year] through 1965 and any individual with known risks for hepatitis C.  Skin self-exam. / Monthly.  Influenza vaccine. / Every year.  Tetanus, diphtheria, and acellular pertussis (Tdap/Td) vaccine.** / Consult your health care provider. Pregnant women should receive 1 dose of Tdap vaccine during each pregnancy. 1 dose of Td every 10 years.  Varicella vaccine.** / Consult your health care provider. Pregnant females who do not have evidence of immunity should receive the first dose after pregnancy.  Zoster vaccine.** / 1 dose for adults aged 30 years or older.  Measles, mumps, rubella (MMR) vaccine.** / You need at least 1 dose of MMR if you were born in 1957 or later. You may also need a second dose. For females of childbearing age, rubella immunity should be determined. If there is no evidence of immunity, females who are not pregnant should be vaccinated. If there is no evidence of immunity, females who are pregnant should delay immunization until after pregnancy.  Pneumococcal 13-valent conjugate (PCV13) vaccine.** / Consult your health care provider.  Pneumococcal polysaccharide (PPSV23) vaccine.** / 1 to 2 doses if you smoke cigarettes or if you have certain conditions.  Meningococcal vaccine.** /  Consult your health care provider.  Hepatitis A vaccine.** / Consult your health care provider.  Hepatitis B vaccine.** / Consult your health care provider.  Haemophilus influenzae type b (Hib) vaccine.** / Consult your health care provider. Ages 64 years and over  Blood pressure check.** / Every year.  Lipid and cholesterol check.** / Every 5 years beginning at age 23 years.  Lung cancer screening. / Every year if you  are aged 16-80 years and have a 30-pack-year history of smoking and currently smoke or have quit within the past 15 years. Yearly screening is stopped once you have quit smoking for at least 15 years or develop a health problem that would prevent you from having lung cancer treatment.  Clinical breast exam.** / Every year after age 74 years.  BRCA-related cancer risk assessment.** / For women who have family members with a BRCA-related cancer (breast, ovarian, tubal, or peritoneal cancers).  Mammogram.** / Every year beginning at age 44 years and continuing for as long as you are in good health. Consult with your health care provider.  Pap test.** / Every 3 years starting at age 58 years through age 22 or 39 years with 3 consecutive normal Pap tests. Testing can be stopped between 65 and 70 years with 3 consecutive normal Pap tests and no abnormal Pap or HPV tests in the past 10 years.  HPV screening.** / Every 3 years from ages 64 years through ages 70 or 61 years with a history of 3 consecutive normal Pap tests. Testing can be stopped between 65 and 70 years with 3 consecutive normal Pap tests and no abnormal Pap or HPV tests in the past 10 years.  Fecal occult blood test (FOBT) of stool. / Every year beginning at age 40 years and continuing until age 27 years. You may not need to do this test if you get a colonoscopy every 10 years.  Flexible sigmoidoscopy or colonoscopy.** / Every 5 years for a flexible sigmoidoscopy or every 10 years for a colonoscopy beginning at age 7 years and continuing until age 32 years.  Hepatitis C blood test.** / For all people born from [birth year] through 1965 and any individual with known risks for hepatitis C.  Osteoporosis screening.** / A one-time screening for women ages 30 years and over and women at risk for fractures or osteoporosis.  Skin self-exam. / Monthly.  Influenza vaccine. / Every year.  Tetanus, diphtheria, and acellular pertussis (Tdap/Td)  vaccine.** / 1 dose of Td every 10 years.  Varicella vaccine.** / Consult your health care provider.  Zoster vaccine.** / 1 dose for adults aged 35 years or older.  Pneumococcal 13-valent conjugate (PCV13) vaccine.** / Consult your health care provider.  Pneumococcal polysaccharide (PPSV23) vaccine.** / 1 dose for all adults aged 46 years and older.  Meningococcal vaccine.** / Consult your health care provider.  Hepatitis A vaccine.** / Consult your health care provider.  Hepatitis B vaccine.** / Consult your health care provider.  Haemophilus influenzae type b (Hib) vaccine.** / Consult your health care provider. ** Family history and personal history of risk and conditions may change your health care provider's recommendations.   This information is not intended to replace advice given to you by your health care provider. Make sure you discuss any questions you have with your health care provider.   Document Released: 08/31/2001 Document Revised: 07/26/2014 Document Reviewed: 11/30/2010 Elsevier Interactive Patient Education [employer].

## 2016-03-05 NOTE — Progress Notes (Signed)
Pre visit review using our clinic review tool, if applicable. No additional management support is needed unless otherwise documented below in the visit note. 

## 2016-03-05 NOTE — Progress Notes (Signed)
Subjective:   Elizabeth Mccullough is a 75 y.o. female who presents for Medicare Annual (Subsequent) preventive examination.  Review of Systems:   Review of Systems  Constitutional: Negative for activity change, appetite change and fatigue.  HENT: Negative for hearing loss, congestion, tinnitus and ear discharge.   Eyes: Negative for visual disturbance (see optho q1y -- vision corrected to 20/20 with glasses).  Respiratory: Negative for cough, chest tightness and shortness of breath.   Cardiovascular: Negative for chest pain, palpitations and leg swelling.  Gastrointestinal: Negative for abdominal pain, diarrhea, constipation and abdominal distention.  Genitourinary: Negative for urgency, frequency, decreased urine volume and difficulty urinating.  Musculoskeletal: Negative for back pain, arthralgias and gait problem.  Skin: Negative for color change, pallor and rash.  Neurological: Negative for dizziness, light-headedness, numbness and headaches.  Hematological: Negative for adenopathy. Does not bruise/bleed easily.  Psychiatric/Behavioral: Negative for suicidal ideas, confusion, sleep disturbance, self-injury, dysphoric mood, decreased concentration and agitation.  Pt is able to read and write and can do all ADLs No risk for falling No abuse/ violence in home          Objective:     Vitals: BP (P) 130/78 (BP Location: Left Arm, Patient Position: Sitting, Cuff Size: Normal)   Pulse (P) 77   Temp (P) 97.7 F (36.5 C)   Wt (P) 157 lb 9.6 oz (71.5 kg)   LMP 07/19/1988   SpO2 (P) 99%   BMI (P) 29.30 kg/m   Body mass index is 29.3 kg/m (pended).   BP (P) 130/78 (BP Location: Left Arm, Patient Position: Sitting, Cuff Size: Normal)   Pulse (P) 77   Temp (P) 97.7 F (36.5 C)   Wt (P) 157 lb 9.6 oz (71.5 kg)   LMP 07/19/1988   SpO2 (P) 99%   BMI (P) 29.30 kg/m  General appearance: alert, cooperative, appears stated age and no distress Head: Normocephalic, without  obvious abnormality, atraumatic Eyes: conjunctivae/corneas clear. PERRL, EOM's intact. Fundi benign. Ears: normal TM's and external ear canals both ears Nose: Nares normal. Septum midline. Mucosa normal. No drainage or sinus tenderness. Throat: lips, mucosa, and tongue normal; teeth and gums normal Neck: no adenopathy, no carotid bruit, no JVD, supple, symmetrical, trachea midline and thyroid not enlarged, symmetric, no tenderness/mass/nodules Back: symmetric, no curvature. ROM normal. No CVA tenderness. Lungs: clear to auscultation bilaterally Breasts: normal appearance, no masses or tenderness Heart: regular rate and rhythm, S1, S2 normal, no murmur, click, rub or gallop Abdomen: soft, non-tender; bowel sounds normal; no masses,  no organomegaly Pelvic: not indicated; post-menopausal, no abnormal Pap smears in past Extremities: extremities normal, atraumatic, no cyanosis or edema Pulses: 2+ and symmetric Skin: Skin color, texture, turgor normal. No rashes or lesions Lymph nodes: Cervical, supraclavicular, and axillary nodes normal. Neurologic: Alert and oriented X 3, normal strength and tone. Normal symmetric reflexes. Normal coordination and gait  Tobacco History  Smoking Status  . Never Smoker  Smokeless Tobacco  . Never Used     Counseling given: Not Answered   Past Medical History:  Diagnosis Date  . AAA (abdominal aortic aneurysm) ([place])   . Hyperlipidemia    Past Surgical History:  Procedure Laterality Date  . BREAST CYST ASPIRATION  1965   Right Breast  . HAMMER TOE SURGERY  04/10/02   Left Toe  . KNEE SURGERY Right 10/17/15   meniscus repair  . NASAL SEPTUM SURGERY  1980   Family History  Problem Relation Age of Onset  . Heart  disease Mother   . Hyperlipidemia Sister   . Hyperlipidemia Brother   . Diabetes Sister   . Hyperlipidemia Sister   . Cancer Sister 81    colon  . Cancer Sister     breast  . Stroke Brother   . Hypertension Brother   . Breast  cancer    . Colon cancer     History  Sexual Activity  . Sexual activity: Not Currently  . Partners: Male    Outpatient Encounter Prescriptions as of 03/05/2016  Medication Sig  . ALPRAZolam (XANAX) 0.25 MG tablet Take 1 tablet (0.25 mg total) by mouth 2 (two) times daily as needed for anxiety.  Marland Kitchen aspirin 325 MG EC tablet Take 325 mg by mouth 2 (two) times daily.   . carbidopa-levodopa (SINEMET IR) 25-100 MG tablet Take 1 tablet by mouth 4 (four) times daily.  Marland Kitchen ibuprofen (ADVIL) 200 MG tablet Take 2 tablets (400 mg total) by mouth every 6 (six) hours as needed for pain.  Marland Kitchen lovastatin (MEVACOR) 20 MG tablet Take 2 tablets (40 mg total) by mouth at bedtime.  . magnesium hydroxide (PHILLIPS CHEWS) 311 MG CHEW Chew 311 mg by mouth every 4 (four) hours as needed.  . Multiple Vitamin (MULTIVITAMIN) tablet Take 1 tablet by mouth daily.  . [DISCONTINUED] venlafaxine XR (EFFEXOR XR) 150 MG 24 hr capsule Take 1 capsule (150 mg total) by mouth daily with breakfast.   No facility-administered encounter medications on file as of 03/05/2016.     Activities of Daily Living In your present state of health, do you have any difficulty performing the following activities: 03/05/2016  Hearing? N  Vision? N  Difficulty concentrating or making decisions? Y  Walking or climbing stairs? N  Dressing or bathing? N  Doing errands, shopping? Y  Some recent data might be hidden    Patient Care Team: Ann Held, DO as PCP - McCammon S Tat, DO as Consulting Physician (Neurology) Ivin Poot, MD as Consulting Physician (Cardiothoracic Surgery) Shon Hough, MD as Consulting Physician (Ophthalmology)    Assessment:    cpe  Exercise Activities and Dietary recommendations Current Exercise Habits: The patient does not participate in regular exercise at present, Exercise limited by: neurologic condition(s)  Goals    None     Fall Risk Fall Risk  03/05/2016 03/03/2015 01/16/2014    Falls in the past year? Yes No Yes  Number falls in past yr: 2 or more - 2 or more  Injury with Fall? Yes - -  Risk Factor Category  - - High Fall Risk  Risk for fall due to : - - Other (Comment)  Risk for fall due to (comments): - - Shoes-- patient has since stopped wearing them   Depression Screen PHQ 2/9 Scores 03/05/2016 03/03/2015 01/16/2014 12/13/2012  PHQ - 2 Score 0 0 0 0     Cognitive Testing Mild memory impairment per neuro  Immunization History  Administered Date(s) Administered  . Influenza Split 05/11/2011, 05/10/2012  . Influenza Whole 05/16/2007, 04/16/2008, 05/19/2009, 04/29/2010  . Influenza, High Dose Seasonal PF 05/24/2013, 05/15/2015  . Influenza,inj,Quad PF,36+ Mos 04/29/2014  . Pneumococcal Conjugate-13 01/16/2014  . Pneumococcal Polysaccharide-23 06/03/2004  . Td 07/05/2006  . Tdap 04/10/2013   Screening Tests Health Maintenance  Topic Date Due  . ZOSTAVAX  09/15/1998  . INFLUENZA VACCINE  02/17/2016  . MAMMOGRAM  01/04/2017  . TETANUS/TDAP  04/11/2023  . DEXA SCAN  Completed  . PNA vac Low Risk  Adult  Completed      Plan:    see AVS During the course of the visit the patient was educated and counseled about the following appropriate screening and preventive services:   Vaccines to include Pneumoccal, Influenza, Hepatitis B, Td, Zostavax, HCV  Electrocardiogram  Cardiovascular Disease  Colorectal cancer screening  Bone density screening  Diabetes screening  Glaucoma screening  Mammography/PAP  Nutrition counseling   Patient Instructions (the written plan) was given to the patient.  1. Preventative health care See above - Comprehensive metabolic panel - Lipid panel - POCT urinalysis dipstick - CBC with Differential/Platelet  2. Hyperlipidemia Check labs - Comprehensive metabolic panel - Lipid panel  3. Estrogen deficiency   - DG Bone Density; Future  4. Routine history and physical examination of adult    Ann Held, DO  03/06/2016

## 2016-03-05 NOTE — Telephone Encounter (Signed)
Pt called in because she says that her and her provider discussed her starting back medication Effexor 75 MG. Pt says that pharmacy hasnt received Rx. Please advise.

## 2016-03-05 NOTE — Telephone Encounter (Signed)
Please advise      KP 

## 2016-03-05 NOTE — Telephone Encounter (Signed)
sent 

## 2016-03-06 ENCOUNTER — Encounter: Payer: Self-pay | Admitting: Family Medicine

## 2016-03-06 MED ORDER — VENLAFAXINE HCL ER 75 MG PO CP24: 75.0000 mg | ORAL_CAPSULE | Freq: Every day | ORAL | 5 refills | Status: DC

## 2016-03-06 MED ORDER — LOVASTATIN 20 MG PO TABS: 40.0000 mg | ORAL_TABLET | Freq: Every day | ORAL | 1 refills | Status: DC

## 2016-03-06 MED ORDER — ALPRAZOLAM 0.25 MG PO TABS: 0.2500 mg | ORAL_TABLET | Freq: Two times a day (BID) | ORAL | 0 refills | Status: DC | PRN

## 2016-03-06 MED ORDER — CARBIDOPA-LEVODOPA 25-100 MG PO TABS: 1.0000 | ORAL_TABLET | Freq: Four times a day (QID) | ORAL | 1 refills | Status: DC

## 2016-03-06 MED ORDER — BUPROPION HCL 75 MG PO TABS: 75.0000 mg | ORAL_TABLET | Freq: Every day | ORAL | Status: DC

## 2016-03-11 ENCOUNTER — Telehealth: Payer: Self-pay | Admitting: Family Medicine

## 2016-03-11 NOTE — Telephone Encounter (Signed)
Pt called in to request having her lab results mailed to her home address on file.

## 2016-03-11 NOTE — Telephone Encounter (Signed)
They have been mailed.    KP

## 2016-03-12 ENCOUNTER — Encounter: Payer: Self-pay | Admitting: Family Medicine

## 2016-03-12 DIAGNOSIS — M85851 Other specified disorders of bone density and structure, right thigh: Secondary | ICD-10-CM | POA: Diagnosis not present

## 2016-04-07 ENCOUNTER — Telehealth: Payer: Self-pay | Admitting: Family Medicine

## 2016-04-07 NOTE — Telephone Encounter (Signed)
Patient would like to have a copy of her Bone Density test mailed to her  Elizabeth Mccullough.  [place] [id number]

## 2016-04-07 NOTE — Telephone Encounter (Signed)
Kim-- I only saw that DEXA was ordered. Did not see actual result. Called [facility] and requested result be faxed to [place]. Awaiting report to be faxed to nurse station.

## 2016-04-08 NOTE — Telephone Encounter (Signed)
I already have the results. Copy mailed.    KP

## 2016-04-16 DIAGNOSIS — Z9889 Other specified postprocedural states: Secondary | ICD-10-CM | POA: Diagnosis not present

## 2016-04-16 DIAGNOSIS — Z4789 Encounter for other orthopedic aftercare: Secondary | ICD-10-CM | POA: Diagnosis not present

## 2016-04-16 DIAGNOSIS — M25562 Pain in left knee: Secondary | ICD-10-CM | POA: Diagnosis not present

## 2016-04-30 NOTE — Progress Notes (Signed)
Elizabeth Mccullough was seen today in the movement disorders clinic for neurologic consultation at the request of Ann Held, DO.  The consultation is for the evaluation of tremor.  Prior medical records were reviewed.  She is accompanied by her friend who supplements the hx.  The pt noted that she began to have tremor about a year ago.  It has increased over the year.  I noted in med records that the pt went to the ED on 04/10/13 after a fall.  Her sandal had caught on the sidewalk and she had to have stitches in her finger.  07/05/13 update:  The patient is following up today regarding her Parkinson's disease, which was just diagnosed last visit, in October 2014.  I started her on levodopa and referred her to the Parkinson's program.  The pt reports that she is taking OTC sleep medication which is helping.  She initially had nausea with the carbidopa/levodopa but is doing much better.  She really isn't sure if the nausea was from the carbidopa/levodopa 25/100 or from the antidepressant she had started but she feels good now.  She does state that she fell once since last visit.  She says that she just "wasn't looking down" and tripped walking around a car door. She insists that she is doing markedly better.  She started driving again.  "I feel almost normal."  She was supposed to go to physical therapy since our last visit, but admits that she had the initial evaluation and 2 subsequent visits, but this was when she was very nauseated and just could not tolerate the therapy.  She really is not doing any outside exercise. I did review records from other physicians since her last visit.  She had a lower extremity venous Doppler that did not demonstrate DVT but did demonstrate a rather extensive superficial thrombus that extended from above the knee to almost the ankle.  11/06/13 update:  Pt is on carbidopa/levodopa 25/100 three times per day.  She is feeling better.  No hallucinations.  No falls.  "I  can walk, I can write, my mood is good."  She is tolerating the effexor well.  She is not exercising.    05/06/14 update:  Pt is f/u today re: PD. She is on carbidopa/levodopa 25/100 tid (7am/12pm/5pm).  She is on effexor for her mood.  She is feeling well.  She reports a few falls since last visit.  One was at her sons house.  She was going up the stairs and was carrying food and fell on her knees.  She had one other minor fall.  No hallucinations.  No lightheadedness or near syncope.  She is not exercising.  She notes that she has some trouble raising the R arm.   02/20/15 update:  The patient is following up today.  I have not seen her since October, 2015.  She had an appointment in April that she canceled because she reported that she was moving.  She is currently on carbidopa/levodopa 25/100, one tablet 3 times per day.   She is tolerating that well.  She remains on Effexor for anxiety and depression.  I reviewed records available to me since last visit.  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  She does not need a follow-up with them for another year and a half.  She is not exercising faithfully.  She does have some tremor of the L hand if nervous or if she is trying  to put on eyebrow pencil.  Wearing off:  No.  How long before next dose:  n/a Falls:   Yes.  , last week; fell over a pillow on the floor; did not get hurt N/V:  No. Hallucinations:  No.  visual distortions: No. Lightheaded:  No.  Syncope: No. Dyskinesia:  No.  11/14/15 update:  The patient is following up today.  I have not seen her since August, 2016.  She has a history of Parkinson's disease and is on carbidopa/levodopa 25/100, one tablet 3 times per day.  More tremor when she drives.  No lightheadedness.  No hallucinations.  She does have a history of depression and is on Effexor.  At our last visit, she reported that she was off of benzodiazepines, but states today that she is back on Xanax.  States that she really isn't taking that.  States that she only takes it if she goes on plane, etc. I did have the opportunity to review records since last visit.  She went to the emergency room on January 12 after having a fall.  She was attempting to get on the bed and fell and hit her head.  This did require stitches.  States that she had about 3 falls in total.    05/03/16 update:  The patient follows up today.  She has history of Parkinson's disease and last visit I increased her carbidopa/levodopa 25/100, so that she was taking one tablet 4 times a day instead of one tablet 3 times per day.  I asked her to take it at 7 AM/11 AM/3 PM/7 PM.  She states that about 30% of the time she takes it qid and about 70% of the time she takes it qid.  She had a fall about 2 months ago.  She was walking down her granddaughters patio and she fell on the L knee.  She is walking the track some for exercise but mostly is doing knee/leg stretches.  She denies lightheadedness or near syncope.  She denies hallucinations.  Has occasional nausea.    PREVIOUS MEDICATIONS: none to date  ALLERGIES:   Allergies  Allergen Reactions  . Penicillins Hives  . Iohexol Rash     Code: RASH, Desc: PATIENT STATES SHE IS ALLERGIC TO IV DYE. 20 YRS AGO SHE HAD A REACTION AT [facility], PT WAS GIVEN BENADRYL. 07/13/06/RM, Onset Date: [id number]     CURRENT MEDICATIONS:  Current Outpatient Prescriptions on File Prior to Visit  Medication Sig Dispense Refill  . carbidopa-levodopa (SINEMET IR) 25-100 MG tablet Take 1 tablet by mouth 4 (four) times daily. 360 tablet 1  . ibuprofen (ADVIL) 200 MG tablet Take 2 tablets (400 mg total) by mouth every 6 (six) hours as needed for pain. 30 tablet 0  . lovastatin (MEVACOR) 20 MG tablet Take 2 tablets (40 mg total) by mouth at bedtime. 180 tablet 1  . magnesium hydroxide (PHILLIPS CHEWS) 311 MG CHEW Chew 311 mg by mouth every 4 (four) hours as needed.    . Multiple Vitamin (MULTIVITAMIN)  tablet Take 1 tablet by mouth daily.     No current facility-administered medications on file prior to visit.     PAST MEDICAL HISTORY:   Past Medical History:  Diagnosis Date  . AAA (abdominal aortic aneurysm) ([place])   . Hyperlipidemia     PAST SURGICAL HISTORY:   Past Surgical History:  Procedure Laterality Date  . BREAST CYST ASPIRATION  1965   Right Breast  . HAMMER TOE SURGERY  04/10/02   Left Toe  . KNEE SURGERY Right 10/17/15   meniscus repair  . NASAL SEPTUM SURGERY  1980    SOCIAL HISTORY:   Social History   Social History  . Marital status: Widowed    Spouse name: N/A  . Number of children: N/A  . Years of education: N/A   Occupational History  . retired    Social History Main Topics  . Smoking status: Never Smoker  . Smokeless tobacco: Never Used  . Alcohol use Yes     Comment: social wine   . Drug use: No  . Sexual activity: Not Currently    Partners: Male   Other Topics Concern  . Not on file   Social History Narrative   Exercise-- no    FAMILY HISTORY:   Family Status  Relation Status  . Mother Deceased at age 1   chf  . Father Deceased at age 30   addisons  . Sister Alive   65  . Brother Deceased at age birth  . Sister Alive   54  . Sister Deceased  . Sister Deceased at age 44  . Brother Deceased  . Brother Deceased at age 54   bleeding ulcer, heart  . Brother Deceased at age 56   pneumonia  .      ROS:  A complete 10 system review of systems was obtained and was unremarkable apart from what is mentioned above.  PHYSICAL EXAMINATION:    VITALS:   Vitals:   05/03/16 1105  BP: 138/82  Pulse: 78  Weight: 159 lb (72.1 kg)  Height: 5' 1.5" (1.562 m)   Wt Readings from Last 3 Encounters:  05/03/16 159 lb (72.1 kg)  03/05/16 (P) 157 lb 9.6 oz (71.5 kg)  11/14/15 154 lb (69.9 kg)    GEN:  The patient appears stated age and is in NAD. HEENT:  Normocephalic, atraumatic.  The mucous membranes are moist. The superficial  temporal arteries are without ropiness or tenderness. CV:  RRR Lungs:  CTAB Neck/HEME:  There are no carotid bruits bilaterally.  Neurological examination:  Orientation: The patient is alert and oriented x3.  Cranial nerves: There is good facial symmetry.  No significant facial hypomimia.  The speech is fluent and clear.  It is hypophonic.  The patient is able to make the gutteral sounds without difficulty.  Soft palate rises symmetrically and there is no tongue deviation. Hearing is intact to conversational tone. Sensation: Sensation is intact to light throughout. Motor: Strength is 5/5 in the bilateral upper and lower extremities.   Shoulder shrug is equal and symmetric.  There is no pronator drift.  Movement examination: Tone: There is mod increased tone in the right upper extremity.  The left is mildly increased. Abnormal movements: There is near constant RUE resting tremor Coordination:  There is decremation in the R and L with finger taps and hand opening and closing.   Gait and Station: The patient has no difficulty arising out of a deep-seated chair without the use of the hands.  She was able to arise on the first attempt.  She does have decreased arm swing bilaterally.  Negative pull test.    ASSESSMENT/PLAN:  1.  Parkinson's disease, diagnosed in 04/20/2013.  -encouraged her to take her carbidopa/levodopa 25/100, one tablet four times a day at 7/11/3/7.      -talked to her about compliance with f/u visits.  Told her would not continue to RF medication beyond 6 months.  Told her that i would prefer to see her every 3-4 months but she refused.  -I encouraged her again to exercise.  Greater than 50% of 25 min visit in counseling. 2.  Anxiety and ? Depression  -She is doing much better on Effexor  3.  Lightheadedness  -she admits that she doesn't drink enough water per day and encouraged her to do so. 4.  F/u in 6 months, sooner should new neurologic issues arise.  Much greater than  50% of this visit was spent in counseling and coordinating care.  Total face to face time:  25 min

## 2016-05-03 ENCOUNTER — Encounter: Payer: Self-pay | Admitting: Neurology

## 2016-05-03 ENCOUNTER — Ambulatory Visit (INDEPENDENT_AMBULATORY_CARE_PROVIDER_SITE_OTHER): Payer: Medicare Other | Admitting: Neurology

## 2016-05-03 VITALS — BP 138/82 | HR 78 | Ht 61.5 in | Wt 159.0 lb

## 2016-05-03 DIAGNOSIS — G20A1 Parkinson's disease without dyskinesia, without mention of fluctuations: Secondary | ICD-10-CM

## 2016-05-03 DIAGNOSIS — R42 Dizziness and giddiness: Secondary | ICD-10-CM | POA: Diagnosis not present

## 2016-05-03 DIAGNOSIS — G20 Parkinson's disease: Secondary | ICD-10-CM | POA: Diagnosis not present

## 2016-05-13 ENCOUNTER — Ambulatory Visit (INDEPENDENT_AMBULATORY_CARE_PROVIDER_SITE_OTHER): Payer: Medicare Other | Admitting: Behavioral Health

## 2016-05-13 DIAGNOSIS — Z23 Encounter for immunization: Secondary | ICD-10-CM | POA: Diagnosis not present

## 2016-06-04 DIAGNOSIS — M25561 Pain in right knee: Secondary | ICD-10-CM | POA: Diagnosis not present

## 2016-06-04 DIAGNOSIS — M170 Bilateral primary osteoarthritis of knee: Secondary | ICD-10-CM | POA: Diagnosis not present

## 2016-06-04 DIAGNOSIS — Z9889 Other specified postprocedural states: Secondary | ICD-10-CM | POA: Diagnosis not present

## 2016-06-04 DIAGNOSIS — M25562 Pain in left knee: Secondary | ICD-10-CM | POA: Diagnosis not present

## 2016-06-04 DIAGNOSIS — Z4789 Encounter for other orthopedic aftercare: Secondary | ICD-10-CM | POA: Diagnosis not present

## 2016-06-08 ENCOUNTER — Other Ambulatory Visit: Payer: Self-pay | Admitting: Cardiothoracic Surgery

## 2016-06-08 DIAGNOSIS — I712 Thoracic aortic aneurysm, without rupture, unspecified: Secondary | ICD-10-CM

## 2016-06-17 ENCOUNTER — Other Ambulatory Visit: Payer: Self-pay | Admitting: Neurology

## 2016-07-07 ENCOUNTER — Ambulatory Visit (INDEPENDENT_AMBULATORY_CARE_PROVIDER_SITE_OTHER): Payer: Medicare Other | Admitting: Cardiothoracic Surgery

## 2016-07-07 ENCOUNTER — Encounter: Payer: Self-pay | Admitting: Cardiothoracic Surgery

## 2016-07-07 ENCOUNTER — Ambulatory Visit
Admission: RE | Admit: 2016-07-07 | Discharge: 2016-07-07 | Disposition: A | Discharge status: Home / Self Care | Payer: Medicare Other | Source: Ambulatory Visit | Attending: Cardiothoracic Surgery | Admitting: Cardiothoracic Surgery

## 2016-07-07 VITALS — BP 140/78 | HR 77 | Resp 16 | Ht 61.5 in | Wt 153.0 lb

## 2016-07-07 DIAGNOSIS — I712 Thoracic aortic aneurysm, without rupture, unspecified: Secondary | ICD-10-CM

## 2016-07-07 NOTE — Progress Notes (Signed)
PCP is Ann Held, DO Referring Provider is Carollee Herter, Alferd Apa, *  Chief Complaint  Patient presents with  . 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 It is asymptomatic. The patient has known mild aortic insufficiency, also asymptomatic. The patient does not have hypertension but her functional status has declined since her last visit 1.5 years ago from her Parkinson's disease and osteoarthritis. She currently lives with a daughter. She still drives.  CT of the thoracic aorta shows no change since last study-he remains 4.1 cm. There is no evidence of hematoma or mural thrombus. No at risk pulmonary nodules or mediastinal adenopathy. Past Medical History:  Diagnosis Date  . AAA (abdominal aortic aneurysm) ([place])   . Hyperlipidemia     Past Surgical History:  Procedure Laterality Date  . BREAST CYST ASPIRATION  1965   Right Breast  . HAMMER TOE SURGERY  04/10/02   Left Toe  . KNEE SURGERY Right 10/17/15   meniscus repair  . NASAL SEPTUM SURGERY  1980    Family History  Problem Relation Age of Onset  . Heart disease Mother   . Hyperlipidemia Sister   . Hyperlipidemia Brother   . Diabetes Sister   . Hyperlipidemia Sister   . Cancer Sister 60    colon  . Cancer Sister     breast  . Stroke Brother   . Hypertension Brother   . Breast cancer    . Colon cancer      Social History Social History  Substance Use Topics  . Smoking status: Never Smoker  . Smokeless tobacco: Never Used  . Alcohol use Yes     Comment: social wine     Current Outpatient Prescriptions  Medication Sig Dispense Refill  . aspirin EC 81 MG tablet Take 81 mg by mouth daily.    . carbidopa-levodopa (SINEMET IR) 25-100 MG tablet Take 1 tablet by mouth 4 (four) times daily. 360 tablet 1  . ibuprofen (ADVIL) 200 MG  tablet Take 2 tablets (400 mg total) by mouth every 6 (six) hours as needed for pain. 30 tablet 0  . lovastatin (MEVACOR) 20 MG tablet Take 2 tablets (40 mg total) by mouth at bedtime. 180 tablet 1  . magnesium hydroxide (PHILLIPS CHEWS) 311 MG CHEW Chew 311 mg by mouth every 4 (four) hours as needed.    . Multiple Vitamin (MULTIVITAMIN) tablet Take 1 tablet by mouth daily.    Marland Kitchen venlafaxine XR (EFFEXOR-XR) 150 MG 24 hr capsule Take 150 mg by mouth daily with breakfast.     No current facility-administered medications for this visit.     Allergies  Allergen Reactions  . Penicillins Hives  . Iohexol Rash     Code: RASH, Desc: PATIENT STATES SHE IS ALLERGIC TO IV DYE. 20 YRS AGO SHE HAD A REACTION AT [facility], PT WAS GIVEN BENADRYL. 07/13/06/RM, Onset Date: [id number]     Review of Systems  Weight is stable Unsteady on her feet with several falls Tremor from Parkinson's disease has progressed No orthopnea PND or ankle edema No abdominal pain No dyspnea on exertion or hemoptysis Pain in both knees from osteoarthritis limiting ambulation  BP 140/78   Pulse 77   Resp 16   Ht 5' 1.5" (1.562 m)   Wt 153 lb (69.4 kg)  LMP 07/19/1988   SpO2 99% Comment: RA  BMI 28.44 kg/m  Physical Exam      Exam    General- alert and comfortable   Lungs- clear without rales, wheezes   Cor- regular rate and rhythm, stable2/6 AI murmur , no gallop   Abdomen- soft, non-tender   Extremities - warm, non-tender, minimal edema, good peripheral pulses   Neuro- oriented, appropriate, no focal weakness, tremor right hand   Diagnostic Tests: CT scan personally reviewed and counseled with patient Her mild-moderate fusiform ascending aneurysm is stable at 4.1 cm Her blood pressure is satisfactory without medications.    Impression: Asymptomatic moderate 4.1 stable fusiform ascending aneurysm Surgery is not recommended until diameter approaches 5.5 cm. She is currently at very low risk for aortic  dissection. Best therapy is to continue monitor blood pressure and surveillance scans [facility].Ash ] which will be arranged.  Plan: Return 18 months with CT scan of the thoracic aorta without contrast Len Childs, MD [facility] [phone]

## 2016-07-22 DIAGNOSIS — M170 Bilateral primary osteoarthritis of knee: Secondary | ICD-10-CM | POA: Diagnosis not present

## 2016-07-29 DIAGNOSIS — M25562 Pain in left knee: Secondary | ICD-10-CM | POA: Diagnosis not present

## 2016-07-29 DIAGNOSIS — M170 Bilateral primary osteoarthritis of knee: Secondary | ICD-10-CM | POA: Diagnosis not present

## 2016-07-29 DIAGNOSIS — M25561 Pain in right knee: Secondary | ICD-10-CM | POA: Diagnosis not present

## 2016-08-09 DIAGNOSIS — M170 Bilateral primary osteoarthritis of knee: Secondary | ICD-10-CM | POA: Diagnosis not present

## 2016-08-14 ENCOUNTER — Other Ambulatory Visit: Payer: Self-pay | Admitting: Family Medicine

## 2016-08-25 ENCOUNTER — Telehealth: Payer: Self-pay | Admitting: Family Medicine

## 2016-08-25 NOTE — Telephone Encounter (Signed)
Patient will call [facility] to get them to transfer from [place] to [place]

## 2016-08-25 NOTE — Telephone Encounter (Signed)
Relation to pt: self  Call back number:[phone] Pharmacy: [facility] [address], [place], [place] [address]  [phone]     Reason for call:  Patient requesting a refill lovastatin (MEVACOR) 20 MG tablet

## 2016-09-22 DIAGNOSIS — M25561 Pain in right knee: Secondary | ICD-10-CM | POA: Diagnosis not present

## 2016-09-22 DIAGNOSIS — M25562 Pain in left knee: Secondary | ICD-10-CM | POA: Diagnosis not present

## 2016-10-29 ENCOUNTER — Other Ambulatory Visit: Payer: Self-pay | Admitting: Family Medicine

## 2016-10-29 DIAGNOSIS — F32A Depression, unspecified: Secondary | ICD-10-CM

## 2016-10-29 DIAGNOSIS — F329 Major depressive disorder, single episode, unspecified: Secondary | ICD-10-CM

## 2016-11-01 ENCOUNTER — Ambulatory Visit: Payer: Medicare Other | Admitting: Neurology

## 2016-11-25 NOTE — Progress Notes (Signed)
Elizabeth Mccullough was seen today in the movement disorders clinic for neurologic consultation at the request of Ann Held, DO.  The consultation is for the evaluation of tremor.  Prior medical records were reviewed.  She is accompanied by her friend who supplements the hx.  The pt noted that she began to have tremor about a year ago.  It has increased over the year.  I noted in med records that the pt went to the ED on 04/10/13 after a fall.  Her sandal had caught on the sidewalk and she had to have stitches in her finger.  07/05/13 update:  The patient is following up today regarding her Parkinson's disease, which was just diagnosed last visit, in October 2014.  I started her on levodopa and referred her to the Parkinson's program.  The pt reports that she is taking OTC sleep medication which is helping.  She initially had nausea with the carbidopa/levodopa but is doing much better.  She really isn't sure if the nausea was from the carbidopa/levodopa 25/100 or from the antidepressant she had started but she feels good now.  She does state that she fell once since last visit.  She says that she just "wasn't looking down" and tripped walking around a car door. She insists that she is doing markedly better.  She started driving again.  "I feel almost normal."  She was supposed to go to physical therapy since our last visit, but admits that she had the initial evaluation and 2 subsequent visits, but this was when she was very nauseated and just could not tolerate the therapy.  She really is not doing any outside exercise. I did review records from other physicians since her last visit.  She had a lower extremity venous Doppler that did not demonstrate DVT but did demonstrate a rather extensive superficial thrombus that extended from above the knee to almost the ankle.  11/06/13 update:  Pt is on carbidopa/levodopa 25/100 three times per day.  She is feeling better.  No hallucinations.  No falls.   "I can walk, I can write, my mood is good."  She is tolerating the effexor well.  She is not exercising.    05/06/14 update:  Pt is f/u today re: PD. She is on carbidopa/levodopa 25/100 tid (7am/12pm/5pm).  She is on effexor for her mood.  She is feeling well.  She reports a few falls since last visit.  One was at her sons house.  She was going up the stairs and was carrying food and fell on her knees.  She had one other minor fall.  No hallucinations.  No lightheadedness or near syncope.  She is not exercising.  She notes that she has some trouble raising the R arm.   02/20/15 update:  The patient is following up today.  I have not seen her since October, 2015.  She had an appointment in April that she canceled because she reported that she was moving.  She is currently on carbidopa/levodopa 25/100, one tablet 3 times per day.   She is tolerating that well.  She remains on Effexor for anxiety and depression.  I reviewed records available to me since last visit.  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  She does not need a follow-up with them for another year and a half.  She is not exercising faithfully.  She does have some tremor of the L hand if nervous or if she is trying  to put on eyebrow pencil.  Wearing off:  No.  How long before next dose:  n/a Falls:   Yes.  , last week; fell over a pillow on the floor; did not get hurt N/V:  No. Hallucinations:  No.  visual distortions: No. Lightheaded:  No.  Syncope: No. Dyskinesia:  No.  11/14/15 update:  The patient is following up today.  I have not seen her since August, 2016.  She has a history of Parkinson's disease and is on carbidopa/levodopa 25/100, one tablet 3 times per day.  More tremor when she drives.  No lightheadedness.  No hallucinations.  She does have a history of depression and is on Effexor.  At our last visit, she reported that she was off of benzodiazepines, but states today that she is back on  Xanax. States that she really isn't taking that.  States that she only takes it if she goes on plane, etc. I did have the opportunity to review records since last visit.  She went to the emergency room on January 12 after having a fall.  She was attempting to get on the bed and fell and hit her head.  This did require stitches.  States that she had about 3 falls in total.    05/03/16 update:  The patient follows up today.  She has history of Parkinson's disease and last visit I increased her carbidopa/levodopa 25/100, so that she was taking one tablet 4 times a day instead of one tablet 3 times per day.  I asked her to take it at 7 AM/11 AM/3 PM/7 PM.  She states that about 30% of the time she takes it qid and about 70% of the time she takes it qid.  She had a fall about 2 months ago.  She was walking down her granddaughters patio and she fell on the L knee.  She is walking the track some for exercise but mostly is doing knee/leg stretches.  She denies lightheadedness or near syncope.  She denies hallucinations.  Has occasional nausea.    11/26/16 update:  Patient seen today in follow-up.  I have not seen her in about 7 months.  She is prescribed carbidopa/levodopa 25/100, one tablet 4 times per day but generally only takes it tid.  States "everything is getting harder for me to do."  She has had "lots and lots" of falls since our last visit.  States that sometimes the tip of her shoe will get caught on her rug and she will fall.  She denies lightheadedness or near syncope.  She denies hallucinations.  No significant exercise (walks to mailbox, etc).    PREVIOUS MEDICATIONS: none to date  ALLERGIES:   Allergies  Allergen Reactions  . Penicillins Hives  . Iohexol Rash     Code: RASH, Desc: PATIENT STATES SHE IS ALLERGIC TO IV DYE. 20 YRS AGO SHE HAD A REACTION AT [facility], PT WAS GIVEN BENADRYL. 07/13/06/RM, Onset Date: [id number]     CURRENT MEDICATIONS:  Current Outpatient Prescriptions on File  Prior to Visit  Medication Sig Dispense Refill  . carbidopa-levodopa (SINEMET IR) 25-100 MG tablet Take 1 tablet by mouth 4 (four) times daily. (Patient taking differently: Take 1 tablet by mouth. 3-4 times daily (usually 3)) 360 tablet 1  . lovastatin (MEVACOR) 20 MG tablet Take 2 tablets (40 mg total) by mouth at bedtime. (Patient taking differently: Take 20 mg by mouth at bedtime. ) 180 tablet 1  . magnesium hydroxide (PHILLIPS CHEWS) 311  MG CHEW Chew 311 mg by mouth every 4 (four) hours as needed.    . Multiple Vitamin (MULTIVITAMIN) tablet Take 1 tablet by mouth daily.    Marland Kitchen venlafaxine XR (EFFEXOR-XR) 150 MG 24 hr capsule Take 150 mg by mouth daily with breakfast.     No current facility-administered medications on file prior to visit.     PAST MEDICAL HISTORY:   Past Medical History:  Diagnosis Date  . AAA (abdominal aortic aneurysm) ([place])   . Hyperlipidemia     PAST SURGICAL HISTORY:   Past Surgical History:  Procedure Laterality Date  . BREAST CYST ASPIRATION  1965   Right Breast  . HAMMER TOE SURGERY  04/10/02   Left Toe  . KNEE SURGERY Right 10/17/15   meniscus repair  . NASAL SEPTUM SURGERY  1980    SOCIAL HISTORY:   Social History   Social History  . Marital status: Widowed    Spouse name: N/A  . Number of children: N/A  . Years of education: N/A   Occupational History  . retired    Social History Main Topics  . Smoking status: Never Smoker  . Smokeless tobacco: Never Used  . Alcohol use Yes     Comment: social wine   . Drug use: No  . Sexual activity: Not Currently    Partners: Male   Other Topics Concern  . Not on file   Social History Narrative   Exercise-- no    FAMILY HISTORY:   Family Status  Relation Status  . Mother Deceased at age 56       chf  . Father Deceased at age 53       addisons  . Sister Alive       42  . Brother Deceased at age birth  . Sister Alive       81  . Sister Deceased  . Sister Deceased at age 51  . Brother  Deceased  . Brother Deceased at age 69       bleeding ulcer, heart  . Brother Deceased at age 33       pneumonia  . Unknown (Not Specified)    ROS:  A complete 10 system review of systems was obtained and was unremarkable apart from what is mentioned above.  PHYSICAL EXAMINATION:    VITALS:   Vitals:   11/26/16 1529  BP: 140/80  Pulse: 98  SpO2: 96%  Weight: 155 lb (70.3 kg)  Height: 5' 1.5" (1.562 m)   Wt Readings from Last 3 Encounters:  11/26/16 155 lb (70.3 kg)  07/07/16 153 lb (69.4 kg)  05/03/16 159 lb (72.1 kg)    GEN:  The patient appears stated age and is in NAD. HEENT:  Normocephalic, atraumatic.  The mucous membranes are moist. The superficial temporal arteries are without ropiness or tenderness. CV:  RRR Lungs:  CTAB Neck/HEME:  There are no carotid bruits bilaterally.  Neurological examination:  Orientation: The patient is alert and oriented x3.  Cranial nerves: There is good facial symmetry.  No significant facial hypomimia.  The speech is fluent and clear.  It is hypophonic.  The patient is able to make the gutteral sounds without difficulty.  Soft palate rises symmetrically and there is no tongue deviation. Hearing is intact to conversational tone. Sensation: Sensation is intact to light throughout. Motor: Strength is 5/5 in the bilateral upper and lower extremities.   Shoulder shrug is equal and symmetric.  There is no pronator drift.  Movement examination: Tone: There is mod increased tone in the right upper extremity.  The left is mild to mod increased. Abnormal movements: There is near constant RUE resting tremor Coordination:  There is decremation in the R and L with finger taps and hand opening and closing.   Gait and Station: The patient has no difficulty arising out of a deep-seated chair without the use of the hands.  She was able to arise on the first attempt.  She does have marked decreased arm swing bilaterally (virtually none).  Negative pull  test.    ASSESSMENT/PLAN:  1.  Parkinson's disease, diagnosed in 04/20/2013.  -she is having trouble remembering to take med 4 times a day and compliance has been an issue.  She hasn't had med today since 11am (since 3:45pm).  Tried to get her to take more frequently but haven't been successful.  She wants to be given chance again to move dosages closer together and take 1 tablet 4 times a day 7am/11am/4pm/7pm.  Offered to try to increase dosage but refused (take 2 tablets instead of 1 at individual dosing times)    -talked to her about compliance with f/u visits.  Told her would not continue to RF medication beyond 6 months.  Told her that i would prefer to see her every 3-4 months but she refused.  -I encouraged her again to exercise.   2.  Anxiety and ? Depression  -She is doing much better on Effexor.  Asks me if she should go off of it but better on it and encouraged her to continue 3.  Lightheadedness  -resolved. 4.  F/u in 6 months, sooner should new neurologic issues arise.

## 2016-11-26 ENCOUNTER — Encounter: Payer: Self-pay | Admitting: Neurology

## 2016-11-26 ENCOUNTER — Emergency Department (HOSPITAL_COMMUNITY)
Admission: EM | Admit: 2016-11-26 | Discharge: 2016-11-26 | Disposition: A | Discharge status: Home / Self Care | Payer: Medicare Other | Attending: Emergency Medicine | Admitting: Emergency Medicine

## 2016-11-26 ENCOUNTER — Ambulatory Visit (INDEPENDENT_AMBULATORY_CARE_PROVIDER_SITE_OTHER): Payer: Medicare Other | Admitting: Neurology

## 2016-11-26 ENCOUNTER — Emergency Department (HOSPITAL_COMMUNITY): Payer: Medicare Other

## 2016-11-26 ENCOUNTER — Encounter (HOSPITAL_COMMUNITY): Payer: Self-pay | Admitting: Emergency Medicine

## 2016-11-26 VITALS — BP 140/80 | HR 98 | Ht 61.5 in | Wt 155.0 lb

## 2016-11-26 DIAGNOSIS — S060X0A Concussion without loss of consciousness, initial encounter: Secondary | ICD-10-CM

## 2016-11-26 DIAGNOSIS — F411 Generalized anxiety disorder: Secondary | ICD-10-CM

## 2016-11-26 DIAGNOSIS — S8001XA Contusion of right knee, initial encounter: Secondary | ICD-10-CM | POA: Diagnosis not present

## 2016-11-26 DIAGNOSIS — Y929 Unspecified place or not applicable: Secondary | ICD-10-CM | POA: Diagnosis not present

## 2016-11-26 DIAGNOSIS — Z7982 Long term (current) use of aspirin: Secondary | ICD-10-CM | POA: Insufficient documentation

## 2016-11-26 DIAGNOSIS — G20 Parkinson's disease: Secondary | ICD-10-CM | POA: Insufficient documentation

## 2016-11-26 DIAGNOSIS — Z79899 Other long term (current) drug therapy: Secondary | ICD-10-CM | POA: Diagnosis not present

## 2016-11-26 DIAGNOSIS — S098XXA Other specified injuries of head, initial encounter: Secondary | ICD-10-CM | POA: Diagnosis not present

## 2016-11-26 DIAGNOSIS — W19XXXA Unspecified fall, initial encounter: Secondary | ICD-10-CM

## 2016-11-26 DIAGNOSIS — S0083XA Contusion of other part of head, initial encounter: Secondary | ICD-10-CM | POA: Diagnosis not present

## 2016-11-26 DIAGNOSIS — Y939 Activity, unspecified: Secondary | ICD-10-CM | POA: Insufficient documentation

## 2016-11-26 DIAGNOSIS — S8002XA Contusion of left knee, initial encounter: Secondary | ICD-10-CM | POA: Diagnosis not present

## 2016-11-26 DIAGNOSIS — W108XXA Fall (on) (from) other stairs and steps, initial encounter: Secondary | ICD-10-CM | POA: Insufficient documentation

## 2016-11-26 DIAGNOSIS — Y999 Unspecified external cause status: Secondary | ICD-10-CM | POA: Insufficient documentation

## 2016-11-26 DIAGNOSIS — S0990XA Unspecified injury of head, initial encounter: Secondary | ICD-10-CM | POA: Diagnosis present

## 2016-11-26 DIAGNOSIS — S0181XA Laceration without foreign body of other part of head, initial encounter: Secondary | ICD-10-CM | POA: Diagnosis not present

## 2016-11-26 HISTORY — DX: Parkinson's disease: G20

## 2016-11-26 HISTORY — DX: Parkinson's disease without dyskinesia, without mention of fluctuations: G20.A1

## 2016-11-26 MED ORDER — ONDANSETRON HCL 4 MG/2ML IJ SOLN
4.0000 mg | Freq: Once | INTRAMUSCULAR | Status: AC
  Administered 2016-11-26: 4 mg via INTRAVENOUS
  Filled 2016-11-26: qty 2

## 2016-11-26 MED ORDER — ONDANSETRON 4 MG PO TBDP: 4.0000 mg | ORAL_TABLET | Freq: Three times a day (TID) | ORAL | 0 refills | Status: DC | PRN

## 2016-11-26 NOTE — Patient Instructions (Signed)
Take your carbidopa/levodopa 25/100 at 7am/11am/4pm/7pm.  Have a good summer!
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
## 2016-11-26 NOTE — ED Notes (Signed)
ED Provider at bedside. 

## 2016-11-26 NOTE — ED Triage Notes (Addendum)
Pt bib ems from home where pt had mechanical fall; pt sates she tripped over the dog and hit her face on brick, and "I crawled around the backyard for an hour cause I couldn't get up." Abrasions noted to right forehead and nose, bilateral knees, and left elbow, bleeding controlled, not on blood thinners. Denies dizziness or LOC; endorses n/v. Resp e/u; a & O x 4; nad noted at this time.

## 2016-11-26 NOTE — ED Notes (Signed)
Patient transported to CT 

## 2016-11-29 ENCOUNTER — Telehealth: Payer: Self-pay

## 2016-11-29 NOTE — Telephone Encounter (Signed)
Received documentation today. Follow up call made to patient regarding call placed to [facility]. Patient states she only feels knee pain when she gets on her knees and shoulder pain felt when she is trying to get in bed. States she doesn't feel up to coming infor evaluation. Says maybe later on. Patient states she will have her daughter Elizabeth Mccullough call the office when she returns. Patient denies SOB and chest pain. Advise to call office if symptoms worsen or go to ED for evaluation. Patient agreed.

## 2017-01-05 DIAGNOSIS — Z803 Family history of malignant neoplasm of breast: Secondary | ICD-10-CM | POA: Diagnosis not present

## 2017-01-05 DIAGNOSIS — Z1231 Encounter for screening mammogram for malignant neoplasm of breast: Secondary | ICD-10-CM | POA: Diagnosis not present

## 2017-01-05 LAB — HM MAMMOGRAPHY

## 2017-01-07 ENCOUNTER — Encounter: Payer: Self-pay | Admitting: Family Medicine

## 2017-01-11 ENCOUNTER — Encounter: Payer: Self-pay | Admitting: Family Medicine

## 2017-01-11 ENCOUNTER — Ambulatory Visit (INDEPENDENT_AMBULATORY_CARE_PROVIDER_SITE_OTHER): Payer: Medicare Other | Admitting: Family Medicine

## 2017-01-11 VITALS — BP 120/80 | HR 81 | Temp 98.4°F | Resp 16 | Ht 62.0 in | Wt 153.0 lb

## 2017-01-11 DIAGNOSIS — R5383 Other fatigue: Secondary | ICD-10-CM

## 2017-01-11 DIAGNOSIS — G20 Parkinson's disease: Secondary | ICD-10-CM

## 2017-01-11 DIAGNOSIS — I6522 Occlusion and stenosis of left carotid artery: Secondary | ICD-10-CM

## 2017-01-11 DIAGNOSIS — E785 Hyperlipidemia, unspecified: Secondary | ICD-10-CM

## 2017-01-11 DIAGNOSIS — F411 Generalized anxiety disorder: Secondary | ICD-10-CM | POA: Diagnosis not present

## 2017-01-11 DIAGNOSIS — M8580 Other specified disorders of bone density and structure, unspecified site: Secondary | ICD-10-CM | POA: Diagnosis not present

## 2017-01-11 DIAGNOSIS — G20A1 Parkinson's disease without dyskinesia, without mention of fluctuations: Secondary | ICD-10-CM

## 2017-01-11 LAB — COMPREHENSIVE METABOLIC PANEL
ALBUMIN: 4.6 g/dL (ref 3.5–5.2)
ALK PHOS: 76 U/L (ref 39–117)
ALT: 5 U/L (ref 0–35)
AST: 15 U/L (ref 0–37)
BUN: 22 mg/dL (ref 6–23)
CHLORIDE: 99 meq/L (ref 96–112)
CO2: 33 mEq/L — ABNORMAL HIGH (ref 19–32)
Calcium: 9.6 mg/dL (ref 8.4–10.5)
Creatinine, Ser: 0.84 mg/dL (ref 0.40–1.20)
GFR: 69.64 mL/min (ref >60.00)
GLUCOSE: 92 mg/dL (ref 70–99)
POTASSIUM: 3.6 meq/L (ref 3.5–5.1)
Sodium: 139 mEq/L (ref 135–145)
Total Bilirubin: 0.5 mg/dL (ref 0.2–1.2)
Total Protein: 7.3 g/dL (ref 6.0–8.3)

## 2017-01-11 LAB — CBC WITH DIFFERENTIAL/PLATELET
BASOS PCT: 0.2 % (ref 0.0–3.0)
Basophils Absolute: 0 10*3/uL (ref 0.0–0.1)
Eosinophils Absolute: 0.1 10*3/uL (ref 0.0–0.7)
Eosinophils Relative: 1.4 % (ref 0.0–5.0)
HCT: 38.5 % (ref 36.0–46.0)
Hemoglobin: 12.9 g/dL (ref 12.0–15.0)
Lymphocytes Relative: 32.2 % (ref 12.0–46.0)
Lymphs Abs: 2.3 10*3/uL (ref 0.7–4.0)
MCHC: 33.5 g/dL (ref 30.0–36.0)
MCV: 94.6 fl (ref 78.0–100.0)
MONO ABS: 0.6 10*3/uL (ref 0.1–1.0)
MONOS PCT: 8.4 % (ref 3.0–12.0)
NEUTROS PCT: 57.8 % (ref 43.0–77.0)
Neutro Abs: 4.2 10*3/uL (ref 1.4–7.7)
Platelets: 316 10*3/uL (ref 150.0–400.0)
RBC: 4.07 Mil/uL (ref 3.87–5.11)
RDW: 13.2 % (ref 11.5–15.5)
WBC: 7.2 10*3/uL (ref 4.0–10.5)

## 2017-01-11 LAB — LIPID PANEL
Cholesterol: 182 mg/dL (ref 0–200)
HDL: 58.7 mg/dL (ref >39.00)
LDL Cholesterol: 107 mg/dL — ABNORMAL HIGH (ref 0–99)
NONHDL: 123.44
Total CHOL/HDL Ratio: 3
Triglycerides: 82 mg/dL (ref 0.0–149.0)
VLDL: 16.4 mg/dL (ref 0.0–40.0)

## 2017-01-11 LAB — POC URINALSYSI DIPSTICK (AUTOMATED)
Bilirubin, UA: NEGATIVE
Blood, UA: NEGATIVE
Glucose, UA: NEGATIVE
KETONES UA: NEGATIVE
Leukocytes, UA: NEGATIVE
Nitrite, UA: NEGATIVE
PH UA: 6 (ref 5.0–8.0)
PROTEIN UA: NEGATIVE
SPEC GRAV UA: 1.025 (ref 1.010–1.025)
UROBILINOGEN UA: 0.2 U/dL

## 2017-01-11 LAB — VITAMIN D 25 HYDROXY (VIT D DEFICIENCY, FRACTURES): VITD: 32.44 ng/mL (ref 30.00–100.00)

## 2017-01-11 LAB — TSH: TSH: 2.68 u[IU]/mL (ref 0.35–4.50)

## 2017-01-11 LAB — VITAMIN B12: VITAMIN B 12: 1312 pg/mL — AB (ref 211–911)

## 2017-01-11 MED ORDER — ALPRAZOLAM 0.25 MG PO TABS: 0.2500 mg | ORAL_TABLET | Freq: Every evening | ORAL | 0 refills | Status: DC | PRN

## 2017-01-11 NOTE — Progress Notes (Signed)
Patient ID: Elizabeth Mccullough, female   DOB: 20-Apr-1941, 76 y.o.   MRN: [id number]     Subjective:  I acted as a Education administrator for Dr. Carollee Herter.  Guerry Bruin, Ward   Patient ID: Elizabeth Mccullough, female    DOB: 1940-10-12, 75 y.o.   MRN: [id number]  Chief Complaint  Patient presents with  . Fatigue    for months.  had a severe concussion about month and a half ago.    HPI  Patient is in today for fatigue.  She has had this for months.  Its gotten worse since her fall.   Pt is not eating regularly and takes meds irregularly.   Patient Care Team: Carollee Herter, Alferd Apa, DO as PCP - General Tat, Eustace Quail, DO as Consulting Physician (Neurology) Prescott Gum, Collier Salina, MD as Consulting Physician (Cardiothoracic Surgery) Shon Hough, MD as Consulting Physician (Ophthalmology)   Past Medical History:  Diagnosis Date  . AAA (abdominal aortic aneurysm) ([place])   . Hyperlipidemia   . Parkinson's disease [facility])     Past Surgical History:  Procedure Laterality Date  . BREAST CYST ASPIRATION  1965   Right Breast  . HAMMER TOE SURGERY  04/10/02   Left Toe  . KNEE SURGERY Right 10/17/15   meniscus repair  . NASAL SEPTUM SURGERY  1980    Family History  Problem Relation Age of Onset  . Heart disease Mother   . Hyperlipidemia Sister   . Hyperlipidemia Brother   . Diabetes Sister   . Hyperlipidemia Sister   . Cancer Sister 48       colon  . Cancer Sister        breast  . Stroke Brother   . Hypertension Brother   . Breast cancer Unknown   . Colon cancer Unknown     Social History   Social History  . Marital status: Widowed    Spouse name: N/A  . Number of children: N/A  . Years of education: N/A   Occupational History  . retired    Social History Main Topics  . Smoking status: Never Smoker  . Smokeless tobacco: Never Used  . Alcohol use Yes     Comment: social wine   . Drug use: No  . Sexual activity: Not Currently    Partners: Male   Other Topics Concern  . Not on  file   Social History Narrative   Exercise-- no    Outpatient Medications Prior to Visit  Medication Sig Dispense Refill  . aspirin 325 MG tablet Take 650 mg by mouth daily.    . carbidopa-levodopa (SINEMET IR) 25-100 MG tablet Take 1 tablet by mouth 4 (four) times daily. (Patient taking differently: Take 1 tablet by mouth. 3-4 times daily (usually 3)) 360 tablet 1  . lovastatin (MEVACOR) 20 MG tablet Take 2 tablets (40 mg total) by mouth at bedtime. (Patient taking differently: Take 20 mg by mouth at bedtime. ) 180 tablet 1  . magnesium hydroxide (PHILLIPS CHEWS) 311 MG CHEW Chew 311 mg by mouth every 4 (four) hours as needed.    . Multiple Vitamin (MULTIVITAMIN) tablet Take 1 tablet by mouth daily.    Marland Kitchen venlafaxine XR (EFFEXOR-XR) 150 MG 24 hr capsule Take 150 mg by mouth daily with breakfast.    . ondansetron (ZOFRAN ODT) 4 MG disintegrating tablet Take 1 tablet (4 mg total) by mouth every 8 (eight) hours as needed for nausea or vomiting. 10 tablet 0   No facility-administered medications prior  to visit.     Allergies  Allergen Reactions  . Penicillins Hives  . Iohexol Rash     Code: RASH, Desc: PATIENT STATES SHE IS ALLERGIC TO IV DYE. 20 YRS AGO SHE HAD A REACTION AT [facility], PT WAS GIVEN BENADRYL. 07/13/06/RM, Onset Date: [id number]     Review of Systems  Constitutional: Positive for malaise/fatigue. Negative for chills and fever.  HENT: Negative for congestion and hearing loss.   Eyes: Negative for discharge.  Respiratory: Negative for cough, sputum production and shortness of breath.   Cardiovascular: Negative for chest pain, palpitations and leg swelling.  Gastrointestinal: Positive for nausea. Negative for abdominal pain, blood in stool, constipation, diarrhea, heartburn and vomiting.  Genitourinary: Negative for dysuria, frequency, hematuria and urgency.  Musculoskeletal: Negative for back pain, falls and myalgias.  Skin: Negative for rash.  Neurological: Positive  for headaches. Negative for dizziness, sensory change, loss of consciousness and weakness.  Endo/Heme/Allergies: Negative for environmental allergies. Does not bruise/bleed easily.  Psychiatric/Behavioral: Negative for depression and suicidal ideas. The patient is not nervous/anxious and does not have insomnia.        Objective:    Physical Exam  Constitutional: She is oriented to person, place, and time. She appears well-developed and well-nourished.  HENT:  Head: Normocephalic and atraumatic.  Eyes: Conjunctivae and EOM are normal.  Neck: Normal range of motion. Neck supple. No JVD present. Carotid bruit is not present. No thyromegaly present.  Cardiovascular: Normal rate, regular rhythm and normal heart sounds.   No murmur heard. Pulmonary/Chest: Effort normal and breath sounds normal. No respiratory distress. She has no wheezes. She has no rales. She exhibits no tenderness.  Musculoskeletal: She exhibits no edema.  Neurological: She is alert and oriented to person, place, and time.  Psychiatric: She has a normal mood and affect. Her behavior is normal. Judgment and thought content normal.  Nursing note and vitals reviewed.   BP 120/80 (BP Location: Left Arm, Cuff Size: Normal)   Pulse 81   Temp 98.4 F (36.9 C) (Oral)   Resp 16   Ht 5\' 2"  (1.575 m)   Wt 153 lb (69.4 kg)   LMP 07/19/1988   SpO2 95%   BMI 27.98 kg/m  Wt Readings from Last 3 Encounters:  01/11/17 153 lb (69.4 kg)  11/26/16 153 lb (69.4 kg)  11/26/16 155 lb (70.3 kg)   BP Readings from Last 3 Encounters:  01/11/17 120/80  11/26/16 (!) 167/76  11/26/16 140/80     Immunization History  Administered Date(s) Administered  . Influenza Split 05/11/2011, 05/10/2012  . Influenza Whole 05/16/2007, 04/16/2008, 05/19/2009, 04/29/2010  . Influenza, High Dose Seasonal PF 05/24/2013, 05/15/2015, 05/13/2016  . Influenza,inj,Quad PF,36+ Mos 04/29/2014  . Pneumococcal Conjugate-13 01/16/2014  . Pneumococcal  Polysaccharide-23 06/03/2004  . Td 07/05/2006  . Tdap 04/10/2013    Health Maintenance  Topic Date Due  . INFLUENZA VACCINE  02/16/2017  . MAMMOGRAM  01/05/2018  . TETANUS/TDAP  04/11/2023  . DEXA SCAN  Completed  . PNA vac Low Risk Adult  Completed    Lab Results  Component Value Date   WBC 7.2 01/11/2017   HGB 12.9 01/11/2017   HCT 38.5 01/11/2017   PLT 316.0 01/11/2017   GLUCOSE 92 01/11/2017   CHOL 182 01/11/2017   TRIG 82.0 01/11/2017   HDL 58.70 01/11/2017   LDLCALC 107 (H) 01/11/2017   ALT 5 01/11/2017   AST 15 01/11/2017   NA 139 01/11/2017   K 3.6 01/11/2017  CL 99 01/11/2017   CREATININE 0.84 01/11/2017   BUN 22 01/11/2017   CO2 33 (H) 01/11/2017   TSH 2.68 01/11/2017   INR 1.03 02/27/2012   HGBA1C 5.5 09/04/2012   MICROALBUR 0.2 01/16/2014    Lab Results  Component Value Date   TSH 2.68 01/11/2017   Lab Results  Component Value Date   WBC 7.2 01/11/2017   HGB 12.9 01/11/2017   HCT 38.5 01/11/2017   MCV 94.6 01/11/2017   PLT 316.0 01/11/2017   Lab Results  Component Value Date   NA 139 01/11/2017   K 3.6 01/11/2017   CO2 33 (H) 01/11/2017   GLUCOSE 92 01/11/2017   BUN 22 01/11/2017   CREATININE 0.84 01/11/2017   BILITOT 0.5 01/11/2017   ALKPHOS 76 01/11/2017   AST 15 01/11/2017   ALT 5 01/11/2017   PROT 7.3 01/11/2017   ALBUMIN 4.6 01/11/2017   CALCIUM 9.6 01/11/2017   GFR 69.64 01/11/2017   Lab Results  Component Value Date   CHOL 182 01/11/2017   Lab Results  Component Value Date   HDL 58.70 01/11/2017   Lab Results  Component Value Date   LDLCALC 107 (H) 01/11/2017   Lab Results  Component Value Date   TRIG 82.0 01/11/2017   Lab Results  Component Value Date   CHOLHDL 3 01/11/2017   Lab Results  Component Value Date   HGBA1C 5.5 09/04/2012         Assessment & Plan:   Problem List Items Addressed This Visit    None    Visit Diagnoses    Fatigue, unspecified type    -  Primary   Relevant Orders   CBC  with Differential/Platelet (Completed)   Comprehensive metabolic panel (Completed)   Vitamin B12 (Completed)   TSH (Completed)   Vitamin D (25 hydroxy) (Completed)   POCT Urinalysis Dipstick (Automated) (Completed)   Osteopenia, unspecified location       Relevant Orders   Vitamin D (25 hydroxy) (Completed)   Anxiety state       Relevant Medications   ALPRAZolam (XANAX) 0.25 MG tablet   Carotid stenosis, asymptomatic, left       Relevant Orders   US Carotid Bilateral   Hyperlipidemia, unspecified hyperlipidemia type       Relevant Orders   Lipid panel (Completed)      I have discontinued Ms. Klosowski's ondansetron. I am also having her start on ALPRAZolam. Additionally, I am having her maintain her magnesium hydroxide, multivitamin, lovastatin, carbidopa-levodopa, venlafaxine XR, and aspirin.  Meds ordered this encounter  Medications  . ALPRAZolam (XANAX) 0.25 MG tablet    Sig: Take 1 tablet (0.25 mg total) by mouth at bedtime as needed for anxiety or sleep.    Dispense:  30 tablet    Refill:  0    CMA served as scribe during this visit. History, Physical and Plan performed by medical provider. Documentation and orders reviewed and attested to.  Ann Held, DO

## 2017-01-11 NOTE — Patient Instructions (Signed)
Atherosclerosis °Atherosclerosis is narrowing and hardening of the blood vessels (arteries). Arteries are tubes that carry blood that contains oxygen from the heart to all parts of the body. Arteries can become narrow or clogged with a buildup of fat, cholesterol, calcium, or other substances (plaque). Plaque decreases the amount of blood that can flow through the artery. °Atherosclerosis can affect any artery in the body, including: °· Heart arteries (coronary artery disease), which may cause heart attack. °· Brain arteries, which may cause stroke. °· Leg, arm, and pelvis arteries (peripheral artery disease), which may cause pain and numbness. °· Kidney arteries, which may cause kidney (renal) failure. ° °Treatment may slow the disease and prevent further damage to the heart, brain, peripheral arteries, and kidneys. °What are the causes? °Atherosclerosis develops when plaque forms in an artery. This damages the inside wall of the artery. Over time, the plaque grows and hardens. It may break through the artery wall. This causes a blood clot to form over the break, which narrows the artery more. The clot may also break loose and travel to other arteries, causing more damage. °What increases the risk? °This condition is more likely to develop in people who: °· Are middle age or older. °· Have a family history of atherosclerosis. °· Have high cholesterol. °· Have high blood fats (triglycerides). °· Have diabetes. °· Are overweight. °· Smoke tobacco. °· Do not exercise enough. °· Have a substance in the blood that indicates increased levels of inflammation in the body (C-reactive protein, or CRP). °· Have sleep apnea. °· Are stressed. °· Drink too much alcohol. ° °What are the signs or symptoms? °This condition may not cause any symptoms. If you do have symptoms, they are caused by damage to an area of your body that is not getting enough blood. The following symptoms are possible: °· Coronary artery disease may cause  chest pain and shortness of breath. °· Decreased blood supply to your brain may cause a stroke. Signs and symptoms of stroke may include sudden: °? Weakness on one side of the body. °? Confusion. °? Changes in vision. °? Inability to speak or understand speech. °? Loss of balance, coordination, or ability to walk. °? Severe headache. °? Loss of consciousness. °· Peripheral artery disease may cause pain and numbness, often in the legs and hips. °· Renal failure may cause fatigue, nausea, swelling, and itchy skin. ° °How is this diagnosed? °This condition is diagnosed based on your medical history and a physical exam. During the exam, your health care provider will check your pulses and listen for a "whooshing" sound over your arteries (bruit). You may have tests, such as: °· Blood tests to check your levels of cholesterol, triglycerides, and CRP. °· Electrocardiogram (ECG) to check for heart damage. °· Chest X-ray to see if your heart is enlarged, which is a sign of heart failure. °· Stress test to see how your heart reacts to exercise. °· Echocardiogram to get images of the inside of your heart. °· Ankle-Brachial index to compare blood pressure in your arms to blood pressure in your ankles. °· Ultrasound of your peripheral arteries to check blood flow. °· CT scan to check for damage to your heart or brain. °· X-rays of blood vessels after dye has been injected (angiogram) to check blood flow. ° °How is this treated? °Treatment starts with lifestyle changes, which may include: °· Changing your diet. °· Losing weight. °· Reducing stress. °· Exercising and being more physically active. °· Not smoking. ° °  You also may need medicine to: °· Lower triglycerides and cholesterol. °· Lower and control blood pressure. °· Prevent blood clots. °· Lower inflammation in your body. °· Lower and control your blood sugar. ° °Sometimes, surgery is needed to remove plaque, widen your artery, or create a new path for your blood  (bypass). Surgical treatment may include: °· Removing plaque from an artery (endarterectomy). °· Opening a narrowed heart artery (angioplasty). °· Heart or peripheral artery bypass graft surgery. ° °Follow these instructions at home: °Lifestyle ° °· Eat a heart-healthy diet. Talk to your health care provider or a diet specialist (dietitian) if you need help. A heart-healthy diet includes: °? Limiting unhealthy fats and increasing healthy fats. Some examples of healthy fats are olive oil and canola oil. °? Eating plant-based foods, such as fruits, vegetables, nuts, legumes, and whole grains. °· Follow an exercise program as told by your health care provider. °· Maintain a healthy weight. Lose weight if directed by your health care provider. °· Rest when you are tired. °· Learn to manage your stress. °· Do not use any tobacco products, such as cigarettes, chewing tobacco, and e-cigarettes. If you need help quitting, ask your health care provider. °· Limit alcohol intake to no more than 1 drink a day for nonpregnant women and 2 drinks a day for men. One drink equals 12 oz of beer, 5 oz of wine, or 1½ oz of hard liquor. °· Do not abuse drugs. °General instructions °· Take over-the-counter and prescription medicines only as told by your health care provider. °· Manage other health conditions as told by your health care provider. °· Keep all follow-up visits as told by your health care provider. This is important. °Contact a health care provider if: °· You have chest pain or discomfort. This includes squeezing chest pain that may feel like indigestion (angina). °· You have shortness of breath. °· You have an irregular heartbeat. °· You have unexplained fatigue. °· You have unexplained pain or numbness in an arm, leg, or hip. °· You have nausea, swelling of your hands or feet, and itchy skin. °Get help right away if: °· You have symptoms of a heart attack, such as: °? Chest pain. °? Shortness of breath. °? Pain in your  neck, jaw, arms, back, or stomach. °? Cold sweat. °? Nausea. °? Light-headedness. °· You have symptoms of a stroke, such as sudden: °? Weakness on one side of your body. °? Confusion. °? Changes in vision. °? Inability to speak or understand speech. °? Loss of balance, coordination, or ability to walk. °? Severe headache. °? Loss of consciousness. °These symptoms may represent a serious problem that is an emergency. Do not wait to see if the symptoms will go away. Get medical help right away. Call your local emergency services (911 in the [place] Do not drive yourself to the hospital. °This information is not intended to replace advice given to you by your health care provider. Make sure you discuss any questions you have with your health care provider. °Document Released: 09/25/2003 Document Revised: 12/11/2015 Document Reviewed: 05/26/2015 °Elsevier Interactive Patient Education © 2018 [employer] ° °

## 2017-01-12 DIAGNOSIS — M8580 Other specified disorders of bone density and structure, unspecified site: Secondary | ICD-10-CM | POA: Insufficient documentation

## 2017-01-12 DIAGNOSIS — I779 Disorder of arteries and arterioles, unspecified: Secondary | ICD-10-CM | POA: Insufficient documentation

## 2017-01-12 DIAGNOSIS — I739 Peripheral vascular disease, unspecified: Secondary | ICD-10-CM

## 2017-01-12 DIAGNOSIS — R5383 Other fatigue: Secondary | ICD-10-CM | POA: Insufficient documentation

## 2017-01-12 HISTORY — DX: Disorder of arteries and arterioles, unspecified: I77.9

## 2017-01-12 NOTE — Assessment & Plan Note (Signed)
Per neuro 

## 2017-01-12 NOTE — Assessment & Plan Note (Signed)
Tolerating statin, encouraged heart healthy diet, avoid trans fats, minimize simple carbs and saturated fats. Increase exercise as tolerated 

## 2017-01-12 NOTE — Assessment & Plan Note (Signed)
Life line results reveiwed Check US carotid

## 2017-01-12 NOTE — Assessment & Plan Note (Signed)
Cont effexor Add xanax prn  F/u 3 months

## 2017-01-13 ENCOUNTER — Ambulatory Visit (HOSPITAL_BASED_OUTPATIENT_CLINIC_OR_DEPARTMENT_OTHER)
Admission: RE | Admit: 2017-01-13 | Discharge: 2017-01-13 | Disposition: A | Discharge status: Home / Self Care | Payer: Medicare Other | Source: Ambulatory Visit | Attending: Family Medicine | Admitting: Family Medicine

## 2017-01-13 DIAGNOSIS — I6522 Occlusion and stenosis of left carotid artery: Secondary | ICD-10-CM

## 2017-01-13 DIAGNOSIS — I6523 Occlusion and stenosis of bilateral carotid arteries: Secondary | ICD-10-CM | POA: Insufficient documentation

## 2017-01-20 ENCOUNTER — Telehealth: Payer: Self-pay | Admitting: Family Medicine

## 2017-01-20 NOTE — Telephone Encounter (Signed)
Pt called in to request to have her most recent labs be mailed to her at her address on file.

## 2017-01-20 NOTE — Telephone Encounter (Signed)
Mailed a copy of results as requested.

## 2017-01-28 ENCOUNTER — Other Ambulatory Visit: Payer: Self-pay | Admitting: Family Medicine

## 2017-01-28 DIAGNOSIS — F329 Major depressive disorder, single episode, unspecified: Secondary | ICD-10-CM

## 2017-01-28 DIAGNOSIS — F32A Depression, unspecified: Secondary | ICD-10-CM

## 2017-02-08 ENCOUNTER — Ambulatory Visit (HOSPITAL_BASED_OUTPATIENT_CLINIC_OR_DEPARTMENT_OTHER)
Admission: RE | Admit: 2017-02-08 | Discharge: 2017-02-08 | Disposition: A | Discharge status: Home / Self Care | Payer: Medicare Other | Source: Ambulatory Visit | Attending: Family Medicine | Admitting: Family Medicine

## 2017-02-08 ENCOUNTER — Encounter: Payer: Self-pay | Admitting: Family Medicine

## 2017-02-08 ENCOUNTER — Ambulatory Visit (INDEPENDENT_AMBULATORY_CARE_PROVIDER_SITE_OTHER): Payer: Medicare Other | Admitting: Family Medicine

## 2017-02-08 VITALS — BP 122/70 | HR 81 | Temp 98.4°F | Resp 16 | Ht 62.0 in | Wt 152.6 lb

## 2017-02-08 DIAGNOSIS — M1611 Unilateral primary osteoarthritis, right hip: Secondary | ICD-10-CM | POA: Insufficient documentation

## 2017-02-08 DIAGNOSIS — S79911A Unspecified injury of right hip, initial encounter: Secondary | ICD-10-CM | POA: Diagnosis not present

## 2017-02-08 DIAGNOSIS — X58XXXA Exposure to other specified factors, initial encounter: Secondary | ICD-10-CM | POA: Insufficient documentation

## 2017-02-08 DIAGNOSIS — R5383 Other fatigue: Secondary | ICD-10-CM | POA: Diagnosis not present

## 2017-02-08 DIAGNOSIS — M25551 Pain in right hip: Secondary | ICD-10-CM | POA: Diagnosis not present

## 2017-02-08 DIAGNOSIS — R0781 Pleurodynia: Secondary | ICD-10-CM | POA: Diagnosis not present

## 2017-02-08 DIAGNOSIS — M545 Low back pain: Secondary | ICD-10-CM | POA: Diagnosis not present

## 2017-02-08 DIAGNOSIS — S2241XA Multiple fractures of ribs, right side, initial encounter for closed fracture: Secondary | ICD-10-CM | POA: Diagnosis not present

## 2017-02-08 DIAGNOSIS — R0602 Shortness of breath: Secondary | ICD-10-CM | POA: Diagnosis not present

## 2017-02-08 DIAGNOSIS — M5136 Other intervertebral disc degeneration, lumbar region: Secondary | ICD-10-CM | POA: Insufficient documentation

## 2017-02-08 DIAGNOSIS — F419 Anxiety disorder, unspecified: Secondary | ICD-10-CM | POA: Diagnosis not present

## 2017-02-08 DIAGNOSIS — G20 Parkinson's disease: Secondary | ICD-10-CM

## 2017-02-08 DIAGNOSIS — S2231XA Fracture of one rib, right side, initial encounter for closed fracture: Secondary | ICD-10-CM | POA: Insufficient documentation

## 2017-02-08 MED ORDER — TRAMADOL HCL 50 MG PO TABS: 50.0000 mg | ORAL_TABLET | Freq: Three times a day (TID) | ORAL | 0 refills | Status: DC | PRN

## 2017-02-08 MED FILL — traMADol HCL 50 MG TABS: 50 | 10 days supply | Qty: 30 | Fill #0

## 2017-02-08 NOTE — Patient Instructions (Signed)
Fall Prevention in Hospitals, Adult WHAT ARE SOME SAFETY TIPS FOR PREVENTING FALLS? If you or a loved one has to stay in the hospital, talk with the health care providers about the risk of falling. Find out which medicines or treatments can cause dizziness or affect balance. Make a plan with the health care providers to prevent falls. The plan may include these points:  Ask for help moving around, especially after surgery or when feeling unwell.  Have support available when getting up and moving around.  Wear nonskid footwear.  Use the safety straps on wheelchairs.  Ask for help to get objects that are out of reach.  Wear eyeglasses.  Remove all clutter from the floor and the sides of the bed.  Keep equipment and wires securely out of the way.  Keep the bed locked in the low position.  Keep the side rails up on the bed.  Keep the nurse call button within reach.  Keep the door open when no one else is in the room.  Have someone stay in the hospital with you or your loved one.  Ask for a bed alarm if you are not able to stay with your loved one who is at risk for getting up without help.  Ask if sleeping pills or other medicines that alter mental status are necessary. WHAT INCREASES THE RISK FOR FALLS? Certain conditions and treatments may increase a patient's risk for falls in a hospital. These include:  Being in an unfamiliar environment.  Being on bed rest.  Having surgery.  Taking certain medicines, such as sleeping pills.  Having tubes in place, such as IV lines or catheters. Additional risk factors for falls in a hospital include:  Having vision problems.  Having a change in thinking, feeling, or behavior (altered mental status).  Having trouble with balance.  Needing to use the toilet frequently.  Having fallen in the past three months.  Having low blood pressure when standing up quickly (orthostatic hypotension). WHAT DOES THE HOSPITAL STAFF DO TO HELP  PREVENT ME OR MY LOVED ONE FROM FALLING? Hospitals have systems in place to prevent falls and accidents. Talk with the hospital staff about:  Doing an assessment to discuss fall risks and create a personalized plan to prevent falls.  Checking in regularly to see if help is needed for moving around and to assess any changes in fall risk.  Knowing where the nurse call button is and how to use it. Use this to call a nursing care provider any time.  Keeping personal items within reach. This includes eyeglasses, phones, and other electronic devices.  Following general safety guidelines when moving around.  Keeping the area around the bed free from clutter.  Removing unnecessary equipment or tubes to reduce the risk of tripping.  Using safety equipment, such as:  Walkers, crutches, and other walking devices for support.  Safety rails on the bed.  Safety straps in the bed.  A bed that can be lowered and locked to prevent movement.  Handrails in the bathroom.  Nonskid socks and shoes.  Locking mechanisms to secure equipment in place.  Lifting and transfer equipment. WHAT CAN I DO TO HELP PREVENT A FALL?  Talk with health care providers about fall prevention.  Have a personalized fall prevention plan in place.  Do not try to move around if you feel off balance or ill.  Change position slowly.  Sit on the side of the bed before standing up.  Sit down and call   for help if you feel dizzy or unsteady when standing.  Keep the hospital room clear of clutter. WHEN SHOULD I ASK FOR HELP? Ask for help whenever you:  Are not sure if you are able to move around safely.  Feel dizzy or unsteady.  Are not comfortable helping your loved one move around or use the bathroom. If you or a loved one falls, tell the hospital staff. This is important. This information is not intended to replace advice given to you by your health care provider. Make sure you discuss any questions you have  with your health care provider. Document Released: 07/02/2000 Document Revised: 12/02/2015 Document Reviewed: 04/17/2015 Elsevier Interactive Patient Education  2017 [employer]  

## 2017-02-08 NOTE — Progress Notes (Signed)
Patient ID: Elizabeth Mccullough, female   DOB: [birth year], 76 y.o.   MRN: [id number]     Subjective:  I acted as a Education administrator for Dr. Carollee Herter.  Guerry Bruin, [place]   Patient ID: Elizabeth Mccullough, female    DOB: 1940/12/09, 76 y.o.   MRN: [id number]  Chief Complaint  Patient presents with  . Fall    Fall  Incident onset: 2 weeks ago. The fall occurred while walking. She landed on concrete. The point of impact was the right shoulder and right knee (right side of ribs). The pain is present in the left knee and right lower leg (right rib pain). Pertinent negatives include no abdominal pain, bowel incontinence, fever, headaches, hearing loss, hematuria, loss of consciousness, nausea, numbness, tingling, visual change or vomiting. Treatments tried: pain meds.    Patient is in today for fall.  Patient Care Team: Carollee Herter, Alferd Apa, DO as PCP - General Tat, Eustace Quail, DO as Consulting Physician (Neurology) Prescott Gum, Collier Salina, MD as Consulting Physician (Cardiothoracic Surgery) Shon Hough, MD as Consulting Physician (Ophthalmology)   Past Medical History:  Diagnosis Date  . AAA (abdominal aortic aneurysm) ([place])   . Hyperlipidemia   . Parkinson's disease [facility])     Past Surgical History:  Procedure Laterality Date  . BREAST CYST ASPIRATION  1965   Right Breast  . HAMMER TOE SURGERY  04/10/02   Left Toe  . KNEE SURGERY Right 10/17/15   meniscus repair  . NASAL SEPTUM SURGERY  1980    Family History  Problem Relation Age of Onset  . Heart disease Mother   . Hyperlipidemia Sister   . Hyperlipidemia Brother   . Diabetes Sister   . Hyperlipidemia Sister   . Cancer Sister 38       colon  . Cancer Sister        breast  . Stroke Brother   . Hypertension Brother   . Breast cancer Unknown   . Colon cancer Unknown     Social History   Social History  . Marital status: Widowed    Spouse name: N/A  . Number of children: N/A  . Years of education: N/A   Occupational  History  . retired    Social History Main Topics  . Smoking status: Never Smoker  . Smokeless tobacco: Never Used  . Alcohol use Yes     Comment: social wine   . Drug use: No  . Sexual activity: Not Currently    Partners: Male   Other Topics Concern  . Not on file   Social History Narrative   Exercise-- no    Outpatient Medications Prior to Visit  Medication Sig Dispense Refill  . ALPRAZolam (XANAX) 0.25 MG tablet Take 1 tablet (0.25 mg total) by mouth at bedtime as needed for anxiety or sleep. 30 tablet 0  . aspirin 325 MG tablet Take 650 mg by mouth daily.    . carbidopa-levodopa (SINEMET IR) 25-100 MG tablet Take 1 tablet by mouth 4 (four) times daily. (Patient taking differently: Take 1 tablet by mouth. 3-4 times daily (usually 3)) 360 tablet 1  . lovastatin (MEVACOR) 20 MG tablet Take 2 tablets (40 mg total) by mouth at bedtime. (Patient taking differently: Take 20 mg by mouth at bedtime. ) 180 tablet 1  . magnesium hydroxide (PHILLIPS CHEWS) 311 MG CHEW Chew 311 mg by mouth every 4 (four) hours as needed.    . Multiple Vitamin (MULTIVITAMIN) tablet Take 1 tablet by mouth daily.    Marland Kitchen  venlafaxine XR (EFFEXOR-XR) 150 MG 24 hr capsule TAKE 1 CAPSULE BY MOUTH DAILY WITH BREAKFAST. 90 capsule 0  . venlafaxine XR (EFFEXOR-XR) 150 MG 24 hr capsule Take 150 mg by mouth daily with breakfast.     No facility-administered medications prior to visit.     Allergies  Allergen Reactions  . Penicillins Hives  . Iohexol Rash     Code: RASH, Desc: PATIENT STATES SHE IS ALLERGIC TO IV DYE. 20 YRS AGO SHE HAD A REACTION AT [facility], PT WAS GIVEN BENADRYL. 07/13/06/RM, Onset Date: [id number]     Review of Systems  Constitutional: Negative for fever.  Gastrointestinal: Negative for abdominal pain, bowel incontinence, nausea and vomiting.  Genitourinary: Negative for hematuria.  Musculoskeletal: Positive for falls and joint pain.  Neurological: Negative for tingling, loss of  consciousness, numbness and headaches.       Objective:    Physical Exam  Eyes:    Pulmonary/Chest:     She exhibits tenderness and bony tenderness.    Musculoskeletal:       Right hip: She exhibits tenderness.       Arms: Nursing note and vitals reviewed.   BP 122/70 (BP Location: Right Arm, Cuff Size: Normal)   Pulse 81   Temp 98.4 F (36.9 C) (Oral)   Resp 16   Ht 5\' 2"  (1.575 m)   Wt 152 lb 9.6 oz (69.2 kg)   LMP 07/19/1988   SpO2 95%   BMI 27.91 kg/m  Wt Readings from Last 3 Encounters:  02/08/17 152 lb 9.6 oz (69.2 kg)  01/11/17 153 lb (69.4 kg)  11/26/16 153 lb (69.4 kg)   BP Readings from Last 3 Encounters:  02/08/17 122/70  01/11/17 120/80  11/26/16 (!) 167/76     Immunization History  Administered Date(s) Administered  . Influenza Split 05/11/2011, 05/10/2012  . Influenza Whole 05/16/2007, 04/16/2008, 05/19/2009, 04/29/2010  . Influenza, High Dose Seasonal PF 05/24/2013, 05/15/2015, 05/13/2016  . Influenza,inj,Quad PF,36+ Mos 04/29/2014  . Pneumococcal Conjugate-13 01/16/2014  . Pneumococcal Polysaccharide-23 06/03/2004  . Td 07/05/2006  . Tdap 04/10/2013    Health Maintenance  Topic Date Due  . INFLUENZA VACCINE  02/16/2017  . MAMMOGRAM  01/05/2018  . TETANUS/TDAP  04/11/2023  . DEXA SCAN  Completed  . PNA vac Low Risk Adult  Completed    Lab Results  Component Value Date   WBC 7.2 01/11/2017   HGB 12.9 01/11/2017   HCT 38.5 01/11/2017   PLT 316.0 01/11/2017   GLUCOSE 92 01/11/2017   CHOL 182 01/11/2017   TRIG 82.0 01/11/2017   HDL 58.70 01/11/2017   LDLCALC 107 (H) 01/11/2017   ALT 5 01/11/2017   AST 15 01/11/2017   NA 139 01/11/2017   K 3.6 01/11/2017   CL 99 01/11/2017   CREATININE 0.84 01/11/2017   BUN 22 01/11/2017   CO2 33 (H) 01/11/2017   TSH 2.68 01/11/2017   INR 1.03 02/27/2012   HGBA1C 5.5 09/04/2012   MICROALBUR 0.2 01/16/2014    Lab Results  Component Value Date   TSH 2.68 01/11/2017   Lab Results    Component Value Date   WBC 7.2 01/11/2017   HGB 12.9 01/11/2017   HCT 38.5 01/11/2017   MCV 94.6 01/11/2017   PLT 316.0 01/11/2017   Lab Results  Component Value Date   NA 139 01/11/2017   K 3.6 01/11/2017   CO2 33 (H) 01/11/2017   GLUCOSE 92 01/11/2017   BUN 22 01/11/2017   CREATININE  0.84 01/11/2017   BILITOT 0.5 01/11/2017   ALKPHOS 76 01/11/2017   AST 15 01/11/2017   ALT 5 01/11/2017   PROT 7.3 01/11/2017   ALBUMIN 4.6 01/11/2017   CALCIUM 9.6 01/11/2017   GFR 69.64 01/11/2017   Lab Results  Component Value Date   CHOL 182 01/11/2017   Lab Results  Component Value Date   HDL 58.70 01/11/2017   Lab Results  Component Value Date   LDLCALC 107 (H) 01/11/2017   Lab Results  Component Value Date   TRIG 82.0 01/11/2017   Lab Results  Component Value Date   CHOLHDL 3 01/11/2017   Lab Results  Component Value Date   HGBA1C 5.5 09/04/2012         Assessment & Plan:   Problem List Items Addressed This Visit      Unprioritized   Fatigue   Relevant Orders   Ambulatory referral to Neuropsychology   Ambulatory referral to [place]    Other Visit Diagnoses    Right hip pain    -  Primary   Relevant Medications   traMADol (ULTRAM) 50 MG tablet   Other Relevant Orders   DG HIP UNILAT WITH PELVIS 2-3 VIEWS RIGHT (Completed)   DG Lumbar Spine 2-3 Views (Completed)   Rib pain on right side       Relevant Medications   traMADol (ULTRAM) 50 MG tablet   Other Relevant Orders   DG Ribs Unilateral W/Chest Right (Completed)   Anxiety       Relevant Orders   Ambulatory referral to Neuropsychology   SOB (shortness of breath)       Relevant Orders   ECHOCARDIOGRAM COMPLETE   Ambulatory referral to [place]   Parkinson's disease [facility])       Relevant Orders   Ambulatory referral to [place]      I am having Ms. Reach start on traMADol. I am also having her maintain her magnesium hydroxide, multivitamin, lovastatin, carbidopa-levodopa,  aspirin, ALPRAZolam, and venlafaxine XR.  Meds ordered this encounter  Medications  . traMADol (ULTRAM) 50 MG tablet    Sig: Take 1 tablet (50 mg total) by mouth every 8 (eight) hours as needed.    Dispense:  30 tablet    Refill:  0    CMA served as scribe during this visit. History, Physical and Plan performed by medical provider. Documentation and orders reviewed and attested to.  Ann Held, DO

## 2017-02-09 ENCOUNTER — Telehealth: Payer: Self-pay | Admitting: *Deleted

## 2017-02-09 ENCOUNTER — Telehealth: Payer: Self-pay | Admitting: Family Medicine

## 2017-02-09 MED ORDER — HYDROCODONE-ACETAMINOPHEN 5-325 MG PO TABS: 1.0000 | ORAL_TABLET | Freq: Three times a day (TID) | ORAL | 0 refills | Status: DC | PRN

## 2017-02-09 NOTE — Telephone Encounter (Signed)
Stop ultram Recommend pain medication 3 times a day: Tylenol 500 mg 2 tablets 3 times a day Or  Vicodin, 1-2 tabs 3 times a day. Advise pt not to mix the medicines, take vicodin OR  tylenol   Warn her about drowsiness w/ vicodin and increased risk of falls

## 2017-02-09 NOTE — Telephone Encounter (Signed)
-----   Message from Ann Held, DO sent at 02/08/2017  9:05 PM EDT ----- Would you call Elizabeth Mccullough and /or pt and find out how her night was and if the ultram is helping.

## 2017-02-09 NOTE — Telephone Encounter (Signed)
Left message on machine to call back  

## 2017-02-09 NOTE — Telephone Encounter (Signed)
LMOM informing Pt of recommendations. Instructed Vicodin Rx must be picked up at front desk and taken to pharmacy. Instructed to call if questions/concerns.

## 2017-02-09 NOTE — Telephone Encounter (Signed)
Relation to TN:BZXY Call back [phone] Pharmacy: [place], [place] - [place] [phone] (Phone) [phone] (Fax)   D.O.D Dr. Larose Kells   Reason for call:  Patient last seen 02/09/17 for right hip pain and stated PCP advised if traMADol (ULTRAM) 50 MG tablet doesn't work to call and PCP will send in a stronger Rx.   Patient 1st administered at 7am this morning and states she's still in pain,please advise

## 2017-02-10 MED FILL — HYDROCODON-APAP 5-325: 5-325 | 5 days supply | Qty: 30 | Fill #0

## 2017-02-10 NOTE — Telephone Encounter (Signed)
Patient notified

## 2017-02-10 NOTE — Telephone Encounter (Signed)
See other telephone note 02/09/2017.

## 2017-02-11 ENCOUNTER — Telehealth: Payer: Self-pay | Admitting: Family Medicine

## 2017-02-11 NOTE — Telephone Encounter (Signed)
Caller name: Isaac Laud Relation to pt: from [facility]  Call back number: [phone]   Reason for call:  Nurse wanted to inform PCP nurse will visit patient home  02/15/17

## 2017-02-11 NOTE — Telephone Encounter (Signed)
noted 

## 2017-02-12 DIAGNOSIS — K5900 Constipation, unspecified: Secondary | ICD-10-CM | POA: Diagnosis not present

## 2017-02-12 DIAGNOSIS — M549 Dorsalgia, unspecified: Secondary | ICD-10-CM | POA: Diagnosis not present

## 2017-02-12 DIAGNOSIS — F419 Anxiety disorder, unspecified: Secondary | ICD-10-CM | POA: Diagnosis not present

## 2017-02-12 DIAGNOSIS — I6522 Occlusion and stenosis of left carotid artery: Secondary | ICD-10-CM | POA: Diagnosis not present

## 2017-02-12 DIAGNOSIS — G20 Parkinson's disease: Secondary | ICD-10-CM | POA: Diagnosis not present

## 2017-02-12 DIAGNOSIS — Z9181 History of falling: Secondary | ICD-10-CM | POA: Diagnosis not present

## 2017-02-12 DIAGNOSIS — M8580 Other specified disorders of bone density and structure, unspecified site: Secondary | ICD-10-CM | POA: Diagnosis not present

## 2017-02-12 DIAGNOSIS — Z7982 Long term (current) use of aspirin: Secondary | ICD-10-CM | POA: Diagnosis not present

## 2017-02-15 ENCOUNTER — Telehealth: Payer: Self-pay | Admitting: Family Medicine

## 2017-02-15 NOTE — Telephone Encounter (Signed)
HHRN informed of verbal ok per PCP 

## 2017-02-15 NOTE — Telephone Encounter (Signed)
Caller name: Nicoloette  Relation to pt: LPN fron [facility] Call back number: [phone]  Pharmacy:  Reason for call:  Requesting verbal orders for PT 1x 1 and 2x 4 and OT to evaluate and treat patient.

## 2017-02-23 ENCOUNTER — Ambulatory Visit (HOSPITAL_BASED_OUTPATIENT_CLINIC_OR_DEPARTMENT_OTHER)
Admission: RE | Admit: 2017-02-23 | Discharge: 2017-02-23 | Disposition: A | Discharge status: Home / Self Care | Payer: Medicare Other | Source: Ambulatory Visit | Attending: Family Medicine | Admitting: Family Medicine

## 2017-02-23 DIAGNOSIS — I083 Combined rheumatic disorders of mitral, aortic and tricuspid valves: Secondary | ICD-10-CM | POA: Insufficient documentation

## 2017-02-23 DIAGNOSIS — R0602 Shortness of breath: Secondary | ICD-10-CM | POA: Diagnosis not present

## 2017-02-23 LAB — ECHOCARDIOGRAM COMPLETE
AOASC: 32 cm
CHL CUP RV SYS PRESS: 32 mmHg
CHL CUP STROKE VOLUME: 60 mL
E/e' ratio: 19.71
FS: 20 % — AB (ref 28–44)
IVS/LV PW RATIO, ED: 0.82
LA ID, A-P, ES: 39 mm
LA diam end sys: 39 mm
LA diam index: 2.29 cm/m2
LA vol A4C: 46.1 ml
LA vol index: 34.5 mL/m2
LA vol: 58.6 mL
LDCA: 2.54 cm2
LV E/e' medial: 19.71
LV E/e'average: 19.71
LV PW d: 11.1 mm — AB (ref 0.6–1.1)
LV TDI E'MEDIAL: 4.13
LVDIAVOL: 167 mL — AB (ref 46–106)
LVDIAVOLIN: 98 mL/m2
LVOT VTI: 19.6 cm
LVOT peak grad rest: 5 mmHg
LVOTD: 18 mm
LVOTPV: 115 cm/s
LVOTSV: 50 mL
LVSYSVOL: 107 mL — AB (ref 14–42)
LVSYSVOLIN: 63 mL/m2
Lateral S' vel: 13.6 cm/s
MRPISAEROA: 0.04 cm2
MV Peak grad: 3 mmHg
MV VTI: 187 cm
MV pk A vel: 140 m/s
MV pk E vel: 81.4 m/s
P 1/2 time: 262 ms
Reg peak vel: 271 cm/s
Simpson's disk: 36
TAPSE: 21.9 mm
TR max vel: 271 cm/s

## 2017-02-23 NOTE — Progress Notes (Signed)
  Echocardiogram 2D Echocardiogram has been performed.  Johny Chess 02/23/2017, 12:29 PM

## 2017-02-24 ENCOUNTER — Telehealth: Payer: Self-pay | Admitting: *Deleted

## 2017-02-24 NOTE — Telephone Encounter (Signed)
Received Home Health Certification and Plan of Care; forwarded to covering provider/SLS08/09

## 2017-02-25 ENCOUNTER — Other Ambulatory Visit: Payer: Self-pay | Admitting: Family Medicine

## 2017-02-25 DIAGNOSIS — I509 Heart failure, unspecified: Secondary | ICD-10-CM

## 2017-02-25 DIAGNOSIS — R931 Abnormal findings on diagnostic imaging of heart and coronary circulation: Secondary | ICD-10-CM

## 2017-02-28 ENCOUNTER — Encounter: Payer: Self-pay | Admitting: Cardiovascular Disease

## 2017-03-01 ENCOUNTER — Ambulatory Visit (INDEPENDENT_AMBULATORY_CARE_PROVIDER_SITE_OTHER): Payer: Medicare Other | Admitting: Cardiovascular Disease

## 2017-03-01 ENCOUNTER — Encounter: Payer: Self-pay | Admitting: Cardiovascular Disease

## 2017-03-01 VITALS — BP 128/78 | HR 92 | Ht 62.0 in | Wt 153.6 lb

## 2017-03-01 DIAGNOSIS — I5042 Chronic combined systolic (congestive) and diastolic (congestive) heart failure: Secondary | ICD-10-CM | POA: Insufficient documentation

## 2017-03-01 DIAGNOSIS — I351 Nonrheumatic aortic (valve) insufficiency: Secondary | ICD-10-CM | POA: Diagnosis not present

## 2017-03-01 DIAGNOSIS — I5023 Acute on chronic systolic (congestive) heart failure: Secondary | ICD-10-CM | POA: Diagnosis not present

## 2017-03-01 HISTORY — DX: Chronic combined systolic (congestive) and diastolic (congestive) heart failure: I50.42

## 2017-03-01 MED ORDER — FUROSEMIDE 20 MG PO TABS: 20.0000 mg | ORAL_TABLET | Freq: Every day | ORAL | 3 refills | Status: DC

## 2017-03-01 MED ORDER — POTASSIUM CHLORIDE ER 10 MEQ PO TBCR: 10.0000 meq | EXTENDED_RELEASE_TABLET | Freq: Every day | ORAL | 3 refills | Status: DC

## 2017-03-01 MED ORDER — ASPIRIN EC 81 MG PO TBEC: 81.0000 mg | DELAYED_RELEASE_TABLET | Freq: Every day | ORAL | Status: DC

## 2017-03-01 NOTE — Patient Instructions (Signed)
Medication Instructions:  DECREASE Aspirin to 81 mg once daily START Furosemide (Lasix) 20 mg once daily in the AM START Kdur (Potassium supplement) 10 meq once daily in the AM   Labwork: TODAY - Pt/INR, CBC, CMET   Testing/Procedures:   [facility] OFFICE [address], [place] [address] Dept: [phone] Loc: [place]  03/01/2017  You are scheduled for a Cardiac Catheterization on Thursday, August 16 with Dr. Harrell Gave End.  1. Please arrive at the [facility] (Main Entrance A) at [facility]: [place], [place] [address] at 10:00 AM (two hours before your procedure to ensure your preparation). Free valet parking service is available.   Special note: Every effort is made to have your procedure done on time. Please understand that emergencies sometimes delay scheduled procedures.  2. Diet: Do not eat or drink anything after midnight prior to your procedure except sips of water to take medications.  3. Labs: You will need to have blood drawn on Tuesday, August 14 at [facility]. [address]  Open: 7:30am - 5pm    Phone: [phone]. You do not need to be fasting.  4. Medication instructions in preparation for your procedure:   On the morning of your procedure, take your Aspirin and any morning medicines NOT listed above.  You may use sips of water.  5. Plan for one night stay--bring personal belongings. 6. Bring a current list of your medications and current insurance cards. 7. You MUST have a responsible person to drive you home. 8. Someone MUST be with you the first 24 hours after you arrive home or your discharge will be delayed. 9. Please wear clothes that are easy to get on and off and wear slip-on shoes.  Thank you for allowing [place] to care for you!   -- [facility]    Follow-Up: Your physician recommends that you schedule a follow-up appointment in: 1 month with Richardson Dopp, PA  Your physician recommends that you schedule a follow-up appointment in: 2 months with Dr. Acie Fredrickson   If you need a refill on your cardiac medications before your next appointment, please call your pharmacy.   Thank you for choosing [facility]! Christen Bame, RN [phone]

## 2017-03-01 NOTE — Progress Notes (Signed)
Cardiology Office Note:    Date:  03/01/2017   ID:  [name], DOB 1940/12/17, MRN [id number]  PCP:  Carollee Herter, Alferd Apa, DO  Cardiologist:  Mertie Moores, MD    Referring MD: Carollee Herter, Alferd Apa, *   Problem list 1. Chronic systolic congestive heart failure 2. Hyperlipidemia 3.  Parkinson's disease 4.  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 3  5.  Mild carotid artery disease  Chief Complaint  Patient presents with  . Congestive Heart Failure    History of Present Illness:    Elizabeth Mccullough is a 76 y.o. female with a hx of Parkinsons, hyperlipidemia . 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 Recent echo shows new EF of 20-25%.  Previous echo in 2013  showed EF 55-60%.  Increased shortness of breath for the past several months .   No PND or orthopnea. No leg swelling .    Does not eat much salt  No CP.  Not able to exercise much secondary to frequent falling and parkinson .  Walks with a cane.    Past Medical History:  Diagnosis Date  . AAA (abdominal aortic aneurysm) ([place])   . Hyperlipidemia   . Parkinson's disease [facility])     Past Surgical History:  Procedure Laterality Date  . BREAST CYST ASPIRATION  1965   Right Breast  . HAMMER TOE SURGERY  04/10/02   Left Toe  . KNEE SURGERY Right 10/17/15   meniscus repair  . NASAL SEPTUM SURGERY  1980    Current Medications: Current Meds  Medication Sig  . ALPRAZolam (XANAX) 0.25 MG tablet Take 1 tablet (0.25 mg total) by mouth at bedtime as needed for anxiety or sleep.  Marland Kitchen aspirin 325 MG tablet Take 650 mg by mouth daily.  . carbidopa-levodopa (SINEMET IR) 25-100 MG tablet Take 1 tablet by mouth 4 (four) times daily. (Patient taking differently: Take 1 tablet by mouth. 3-4 times daily (usually 3))  . HYDROcodone-acetaminophen (NORCO/VICODIN) 5-325 MG tablet Take 1-2 tablets by mouth every 8 (eight) hours as needed.  . lovastatin  (MEVACOR) 20 MG tablet Take 20 mg by mouth daily.  . magnesium hydroxide (PHILLIPS CHEWS) 311 MG CHEW Chew 311 mg by mouth every 4 (four) hours as needed.  . Multiple Vitamin (MULTIVITAMIN) tablet Take 1 tablet by mouth daily.  . traMADol (ULTRAM) 50 MG tablet Take 1 tablet (50 mg total) by mouth every 8 (eight) hours as needed.  . venlafaxine XR (EFFEXOR-XR) 150 MG 24 hr capsule TAKE 1 CAPSULE BY MOUTH DAILY WITH BREAKFAST.     Allergies:   Penicillins and Iohexol   Social History   Social History  . Marital status: Widowed    Spouse name: N/A  . Number of children: N/A  . Years of education: N/A   Occupational History  . retired    Social History Main Topics  . Smoking status: Never Smoker  . Smokeless tobacco: Never Used  . Alcohol use Yes     Comment: social wine   . Drug use: No  . Sexual activity: Not Currently    Partners: Male   Other Topics Concern  . None   Social History Narrative   Exercise-- no     Family History: The patient's family history includes Breast cancer in her unknown relative; Cancer in her sister; Cancer (age of onset: 34) in her sister; Colon cancer  in her unknown relative; Diabetes in her sister; Heart disease in her mother; Hyperlipidemia in her brother, sister, and sister; Hypertension in her brother; Stroke in her brother. ROS:   Please see the history of present illness.     All other systems reviewed and are negative.  EKGs/Labs/Other Studies Reviewed:    The following studies were reviewed today: recoreds from Dr. Prescott Gum  EKG:  EKG is not  ordered today.   Recent Labs: 01/11/2017: ALT 5; BUN 22; Creatinine, Ser 0.84; Hemoglobin 12.9; Platelets 316.0; Potassium 3.6; Sodium 139; TSH 2.68  Recent Lipid Panel    Component Value Date/Time   CHOL 182 01/11/2017 1228   TRIG 82.0 01/11/2017 1228   TRIG 78 07/04/2006 1313   HDL 58.70 01/11/2017 1228   CHOLHDL 3 01/11/2017 1228   VLDL 16.4 01/11/2017 1228   LDLCALC 107 (H)  01/11/2017 1228    Physical Exam:    VS:  BP 128/78   Pulse 92   Ht 5\' 2"  (1.575 m)   Wt 153 lb 9.6 oz (69.7 kg)   LMP 07/19/1988   SpO2 98%   BMI 28.09 kg/m     Wt Readings from Last 3 Encounters:  03/01/17 153 lb 9.6 oz (69.7 kg)  02/08/17 152 lb 9.6 oz (69.2 kg)  01/11/17 153 lb (69.4 kg)     GEN:  Well nourished, well developed in no acute distress Slow movements ( parkinsons)  HEENT: Normal NECK: No JVD; No carotid bruits LYMPHATICRR  2/6 systolic and 2-3 / 6 diastolic murmur  RESPIRATORY:  Clear to auscultation without rales, wheezing or rhonchi  ABDOMEN: Soft, non-tender, non-distended MUSCULOSKELETAL:  No edema; No deformity  SKIN: Warm and dry NEUROLOGIC:  Alert and oriented x 3,     PSYCHIATRIC:  Slightly flat affect  ASSESSMENT:    No diagnosis found. PLAN:    In order of problems listed above:  1. Chronic systolic CHF She has newly diagnosed congestive heart failure. Her previous echocardiogram shows a normal left ventricular systolic function.   In reviewing her echocardiogram, she has akinesis of the anterior septum. She may have had a  anterior wall myocardial infarction.  We will schedule her for a right and left heart catheterization. I discussed the risks, benefits, and options concerning heart catheterization.  We'll start her on Lasix 20 mg a day and potassium chloride 10 mEq a day. At this point I state start her on beta blocker or angiotensin blocker but this will need to be started soon.  We will have her return to see a PA or nurse practitioner in 1 month and I will see her in 2 months.   2. Aortic insufficiency - she has moderate aortic insufficiency. It appears to be moderate on echo. At this point I doubt that her ascending aortic aneurysm is of causing or continuing to this aortic insufficiency but this will need to be investigated further. We'll do an aortogram at the time of her heart cath   3. Mitral regurgitation -  moderate     Medication Adjustments/Labs and Tests Ordered: Current medicines are reviewed at length with the patient today.  Concerns regarding medicines are outlined above.  No orders of the defined types were placed in this encounter.  No orders of the defined types were placed in this encounter.   Signed, Mertie Moores, MD  03/01/2017 2:24 PM    [place]

## 2017-03-02 ENCOUNTER — Telehealth: Payer: Self-pay

## 2017-03-02 DIAGNOSIS — Z91041 Radiographic dye allergy status: Secondary | ICD-10-CM

## 2017-03-02 LAB — COMPREHENSIVE METABOLIC PANEL
ALK PHOS: 92 IU/L (ref 39–117)
ALT: 6 IU/L (ref 0–32)
AST: 17 IU/L (ref 0–40)
Albumin/Globulin Ratio: 1.7 (ref 1.2–2.2)
Albumin: 4.5 g/dL (ref 3.5–4.8)
BUN/Creatinine Ratio: 25 (ref 12–28)
BUN: 20 mg/dL (ref 8–27)
Bilirubin Total: 0.3 mg/dL (ref 0.0–1.2)
CALCIUM: 9.3 mg/dL (ref 8.7–10.3)
CO2: 29 mmol/L (ref 20–29)
Chloride: 101 mmol/L (ref 96–106)
Creatinine, Ser: 0.8 mg/dL (ref 0.57–1.00)
GFR calc Af Amer: 82 mL/min/{1.73_m2} (ref >59)
GFR calc non Af Amer: 71 mL/min/{1.73_m2} (ref >59)
GLUCOSE: 70 mg/dL (ref 65–99)
Globulin, Total: 2.6 g/dL (ref 1.5–4.5)
Potassium: 4.1 mmol/L (ref 3.5–5.2)
Sodium: 143 mmol/L (ref 134–144)
Total Protein: 7.1 g/dL (ref 6.0–8.5)

## 2017-03-02 LAB — CBC
Hematocrit: 37.6 % (ref 34.0–46.6)
Hemoglobin: 12.3 g/dL (ref 11.1–15.9)
MCH: 31.1 pg (ref 26.6–33.0)
MCHC: 32.7 g/dL (ref 31.5–35.7)
MCV: 95 fL (ref 79–97)
PLATELETS: 298 10*3/uL (ref 150–379)
RBC: 3.95 x10E6/uL (ref 3.77–5.28)
RDW: 13.7 % (ref 12.3–15.4)
WBC: 6.7 10*3/uL (ref 3.4–10.8)

## 2017-03-02 LAB — PROTIME-INR
INR: 1 (ref 0.8–1.2)
Prothrombin Time: 10.4 s (ref 9.1–12.0)

## 2017-03-02 MED ORDER — PREDNISONE 50 MG PO TABS: ORAL_TABLET | ORAL | 0 refills | Status: DC

## 2017-03-02 MED ORDER — DIPHENHYDRAMINE HCL 25 MG PO CAPS: 50.0000 mg | ORAL_CAPSULE | Freq: Once | ORAL | 0 refills | Status: DC

## 2017-03-02 NOTE — Telephone Encounter (Signed)
Patient contacted pre-catheterization at [facility] scheduled for:  03/03/2017 @ 1200 Verified arrival time and place:  NT @ 1000 Confirmed AM meds to be taken pre-cath with sip of water: Take ASA, prednisone and benadryl Advised to take prednisone: 8/15 @ 2200, 8/16 @ 0500, 03/03/2017 @ 1000 Hold lasix Confirmed patient has responsible person to drive home post procedure and observe patient for 24 hours:  daughter Addl concerns:  Dye allergy

## 2017-03-03 ENCOUNTER — Encounter (HOSPITAL_COMMUNITY)
Admission: RE | Disposition: A | Discharge status: Home / Self Care | Payer: Self-pay | Source: Ambulatory Visit | Attending: Internal Medicine

## 2017-03-03 ENCOUNTER — Ambulatory Visit (HOSPITAL_COMMUNITY)
Admission: RE | Admit: 2017-03-03 | Discharge: 2017-03-03 | Disposition: A | Discharge status: Home / Self Care | Payer: Medicare Other | Source: Ambulatory Visit | Attending: Internal Medicine | Admitting: Internal Medicine

## 2017-03-03 DIAGNOSIS — Z7982 Long term (current) use of aspirin: Secondary | ICD-10-CM | POA: Diagnosis not present

## 2017-03-03 DIAGNOSIS — Z79899 Other long term (current) drug therapy: Secondary | ICD-10-CM | POA: Insufficient documentation

## 2017-03-03 DIAGNOSIS — I712 Thoracic aortic aneurysm, without rupture: Secondary | ICD-10-CM

## 2017-03-03 DIAGNOSIS — I5023 Acute on chronic systolic (congestive) heart failure: Secondary | ICD-10-CM

## 2017-03-03 DIAGNOSIS — E785 Hyperlipidemia, unspecified: Secondary | ICD-10-CM | POA: Insufficient documentation

## 2017-03-03 DIAGNOSIS — I340 Nonrheumatic mitral (valve) insufficiency: Secondary | ICD-10-CM | POA: Diagnosis not present

## 2017-03-03 DIAGNOSIS — Z880 Allergy status to penicillin: Secondary | ICD-10-CM | POA: Diagnosis not present

## 2017-03-03 DIAGNOSIS — G20 Parkinson's disease: Secondary | ICD-10-CM | POA: Insufficient documentation

## 2017-03-03 DIAGNOSIS — I351 Nonrheumatic aortic (valve) insufficiency: Secondary | ICD-10-CM | POA: Diagnosis not present

## 2017-03-03 DIAGNOSIS — I5042 Chronic combined systolic (congestive) and diastolic (congestive) heart failure: Secondary | ICD-10-CM | POA: Diagnosis present

## 2017-03-03 HISTORY — PX: RIGHT/LEFT HEART CATH AND CORONARY ANGIOGRAPHY: CATH118266

## 2017-03-03 HISTORY — PX: THORACIC AORTOGRAM: CATH118269

## 2017-03-03 LAB — POCT I-STAT 3, VENOUS BLOOD GAS (G3P V)
Acid-Base Excess: 2 mmol/L (ref 0.0–2.0)
Bicarbonate: 27.4 mmol/L (ref 20.0–28.0)
O2 SAT: 65 %
PCO2 VEN: 43.5 mmHg — AB (ref 44.0–60.0)
PO2 VEN: 34 mmHg (ref 32.0–45.0)
TCO2: 29 mmol/L (ref 0–100)
pH, Ven: 7.408 (ref 7.250–7.430)

## 2017-03-03 LAB — POCT ACTIVATED CLOTTING TIME: ACTIVATED CLOTTING TIME: 147 s

## 2017-03-03 SURGERY — RIGHT/LEFT HEART CATH AND CORONARY ANGIOGRAPHY
Anesthesia: LOCAL

## 2017-03-03 MED ORDER — LIDOCAINE HCL (PF) 1 % IJ SOLN
INTRAMUSCULAR | Status: AC
  Filled 2017-03-03: qty 30

## 2017-03-03 MED ORDER — SODIUM CHLORIDE 0.9 % IV SOLN: INTRAVENOUS | Status: AC

## 2017-03-03 MED ORDER — DIPHENHYDRAMINE HCL 25 MG PO CAPS: 50.0000 mg | ORAL_CAPSULE | Freq: Once | ORAL | Status: DC

## 2017-03-03 MED ORDER — CARVEDILOL 3.125 MG PO TABS: 3.1250 mg | ORAL_TABLET | Freq: Two times a day (BID) | ORAL | 11 refills | Status: DC

## 2017-03-03 MED ORDER — ASPIRIN 81 MG PO CHEW: 81.0000 mg | CHEWABLE_TABLET | ORAL | Status: DC

## 2017-03-03 MED ORDER — SODIUM CHLORIDE 0.9 % IV SOLN: 250.0000 mL | INTRAVENOUS | Status: DC | PRN

## 2017-03-03 MED ORDER — SODIUM CHLORIDE 0.9 % WEIGHT BASED INFUSION
3.0000 mL/kg/h | INTRAVENOUS | Status: AC
  Administered 2017-03-03: 3 mL/kg/h via INTRAVENOUS

## 2017-03-03 MED ORDER — VERAPAMIL HCL 2.5 MG/ML IV SOLN
INTRAVENOUS | Status: AC
  Filled 2017-03-03: qty 2

## 2017-03-03 MED ORDER — FENTANYL CITRATE (PF) 100 MCG/2ML IJ SOLN
INTRAMUSCULAR | Status: AC
  Filled 2017-03-03: qty 2

## 2017-03-03 MED ORDER — LIDOCAINE HCL (PF) 1 % IJ SOLN
INTRAMUSCULAR | Status: DC | PRN
  Administered 2017-03-03: 15 mL via INTRADERMAL
  Administered 2017-03-03: 2 mL via INTRADERMAL

## 2017-03-03 MED ORDER — SODIUM CHLORIDE 0.9% FLUSH: 3.0000 mL | INTRAVENOUS | Status: DC | PRN

## 2017-03-03 MED ORDER — SODIUM CHLORIDE 0.9% FLUSH: 3.0000 mL | Freq: Two times a day (BID) | INTRAVENOUS | Status: DC

## 2017-03-03 MED ORDER — MIDAZOLAM HCL 2 MG/2ML IJ SOLN
INTRAMUSCULAR | Status: DC | PRN
  Administered 2017-03-03: 0.5 mg via INTRAVENOUS

## 2017-03-03 MED ORDER — HEPARIN (PORCINE) IN NACL 2-0.9 UNIT/ML-% IJ SOLN
INTRAMUSCULAR | Status: AC
  Filled 2017-03-03: qty 1000

## 2017-03-03 MED ORDER — FENTANYL CITRATE (PF) 100 MCG/2ML IJ SOLN
INTRAMUSCULAR | Status: DC | PRN
  Administered 2017-03-03: 25 ug via INTRAVENOUS

## 2017-03-03 MED ORDER — VERAPAMIL HCL 2.5 MG/ML IV SOLN
INTRAVENOUS | Status: DC | PRN
  Administered 2017-03-03: 10 mL via INTRA_ARTERIAL

## 2017-03-03 MED ORDER — MIDAZOLAM HCL 2 MG/2ML IJ SOLN
INTRAMUSCULAR | Status: AC
  Filled 2017-03-03: qty 2

## 2017-03-03 MED ORDER — IOPAMIDOL (ISOVUE-370) INJECTION 76%
INTRAVENOUS | Status: AC
  Filled 2017-03-03: qty 100

## 2017-03-03 MED ORDER — DIPHENHYDRAMINE HCL 50 MG/ML IJ SOLN: 50.0000 mg | Freq: Once | INTRAMUSCULAR | Status: DC

## 2017-03-03 MED ORDER — SODIUM CHLORIDE 0.9 % WEIGHT BASED INFUSION: 1.0000 mL/kg/h | INTRAVENOUS | Status: DC

## 2017-03-03 MED ORDER — HEPARIN (PORCINE) IN NACL 2-0.9 UNIT/ML-% IJ SOLN
INTRAMUSCULAR | Status: AC | PRN
  Administered 2017-03-03: 1000 mL

## 2017-03-03 MED ORDER — HEPARIN SODIUM (PORCINE) 1000 UNIT/ML IJ SOLN
INTRAMUSCULAR | Status: DC | PRN
  Administered 2017-03-03: 3000 [IU] via INTRAVENOUS

## 2017-03-03 MED ORDER — PREDNISONE 20 MG PO TABS: 50.0000 mg | ORAL_TABLET | Freq: Four times a day (QID) | ORAL | Status: DC

## 2017-03-03 MED ORDER — IOPAMIDOL (ISOVUE-370) INJECTION 76%
INTRAVENOUS | Status: DC | PRN
  Administered 2017-03-03: 65 mL via INTRA_ARTERIAL

## 2017-03-03 SURGICAL SUPPLY — 23 items
CATH 5FR JL3.5 JR4 ANG PIG MP (CATHETERS) ×2 IMPLANT
CATH BALLN WEDGE 5F 110CM (CATHETERS) ×2 IMPLANT
CATH INFINITI 5FR JL4 (CATHETERS) ×2 IMPLANT
COVER PRB 48X5XTLSCP FOLD TPE (BAG) ×1 IMPLANT
COVER PROBE 5X48 (BAG) ×2
DEVICE RAD COMP TR BAND LRG (VASCULAR PRODUCTS) ×2 IMPLANT
ELECT DEFIB PAD ADLT CADENCE (PAD) ×2 IMPLANT
GLIDESHEATH SLEND SS 6F .021 (SHEATH) ×2 IMPLANT
GUIDEWIRE .025 260CM (WIRE) ×2 IMPLANT
GUIDEWIRE INQWIRE 1.5J.035X260 (WIRE) ×1 IMPLANT
INQWIRE 1.5J .035X260CM (WIRE) ×2
KIT ESSENTIALS PG (KITS) ×2 IMPLANT
KIT HEART LEFT (KITS) ×2 IMPLANT
KIT MICROINTRODUCER STIFF 5F (SHEATH) ×2 IMPLANT
PACK CARDIAC CATHETERIZATION (CUSTOM PROCEDURE TRAY) ×2 IMPLANT
SHEATH GLIDE SLENDER 4/5FR (SHEATH) ×4 IMPLANT
SHEATH PINNACLE 5F 10CM (SHEATH) ×2 IMPLANT
SYR MEDRAD MARK V 150ML (SYRINGE) ×2 IMPLANT
TRANSDUCER W/STOPCOCK (MISCELLANEOUS) ×2 IMPLANT
TUBING CIL FLEX 10 FLL-RA (TUBING) ×2 IMPLANT
WIRE EMERALD 3MM-J .035X150CM (WIRE) ×2 IMPLANT
WIRE HI TORQ BMW 190CM (WIRE) ×2 IMPLANT
WIRE HI TORQ VERSACORE-J 145CM (WIRE) ×2 IMPLANT

## 2017-03-03 NOTE — Progress Notes (Addendum)
Site area: RFA Site Prior to Removal:  Level 0 Pressure Applied For:25 min Manual:   yes Patient Status During Pull:stable   Post Pull Site:  Level 0 Post Pull Instructions Given:  yes Post Pull Pulses Present: palpable Dressing Applied:  tegaderm Bedrest begins @ 1330 tll 1730 Comments:

## 2017-03-03 NOTE — H&P (View-Only) (Signed)
Cardiology Office Note:    Date:  03/01/2017   ID:  NICKIE WARWICK, DOB [birth year], MRN [id number]  PCP:  Carollee Herter, Alferd Apa, DO  Cardiologist:  Mertie Moores, MD    Referring MD: Carollee Herter, Alferd Apa, *   Problem list 1. Chronic systolic congestive heart failure 2. Hyperlipidemia 3.  Parkinson's disease 4.  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 3  5.  Mild carotid artery disease  Chief Complaint  Patient presents with  . Congestive Heart Failure    History of Present Illness:    Elizabeth Mccullough is a 76 y.o. female with a hx of Parkinsons, hyperlipidemia . 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 Recent echo shows new EF of 20-25%.  Previous echo in 2013  showed EF 55-60%.  Increased shortness of breath for the past several months .   No PND or orthopnea. No leg swelling .    Does not eat much salt  No CP.  Not able to exercise much secondary to frequent falling and parkinson .  Walks with a cane.    Past Medical History:  Diagnosis Date  . AAA (abdominal aortic aneurysm) ([place])   . Hyperlipidemia   . Parkinson's disease [facility])     Past Surgical History:  Procedure Laterality Date  . BREAST CYST ASPIRATION  1965   Right Breast  . HAMMER TOE SURGERY  04/10/02   Left Toe  . KNEE SURGERY Right 10/17/15   meniscus repair  . NASAL SEPTUM SURGERY  1980    Current Medications: Current Meds  Medication Sig  . ALPRAZolam (XANAX) 0.25 MG tablet Take 1 tablet (0.25 mg total) by mouth at bedtime as needed for anxiety or sleep.  Marland Kitchen aspirin 325 MG tablet Take 650 mg by mouth daily.  . carbidopa-levodopa (SINEMET IR) 25-100 MG tablet Take 1 tablet by mouth 4 (four) times daily. (Patient taking differently: Take 1 tablet by mouth. 3-4 times daily (usually 3))  . HYDROcodone-acetaminophen (NORCO/VICODIN) 5-325 MG tablet Take 1-2 tablets by mouth every 8 (eight) hours as needed.  . lovastatin  (MEVACOR) 20 MG tablet Take 20 mg by mouth daily.  . magnesium hydroxide (PHILLIPS CHEWS) 311 MG CHEW Chew 311 mg by mouth every 4 (four) hours as needed.  . Multiple Vitamin (MULTIVITAMIN) tablet Take 1 tablet by mouth daily.  . traMADol (ULTRAM) 50 MG tablet Take 1 tablet (50 mg total) by mouth every 8 (eight) hours as needed.  . venlafaxine XR (EFFEXOR-XR) 150 MG 24 hr capsule TAKE 1 CAPSULE BY MOUTH DAILY WITH BREAKFAST.     Allergies:   Penicillins and Iohexol   Social History   Social History  . Marital status: Widowed    Spouse name: N/A  . Number of children: N/A  . Years of education: N/A   Occupational History  . retired    Social History Main Topics  . Smoking status: Never Smoker  . Smokeless tobacco: Never Used  . Alcohol use Yes     Comment: social wine   . Drug use: No  . Sexual activity: Not Currently    Partners: Male   Other Topics Concern  . None   Social History Narrative   Exercise-- no     Family History: The patient's family history includes Breast cancer in her unknown relative; Cancer in her sister; Cancer (age of onset: 56) in her sister; Colon cancer  in her unknown relative; Diabetes in her sister; Heart disease in her mother; Hyperlipidemia in her brother, sister, and sister; Hypertension in her brother; Stroke in her brother. ROS:   Please see the history of present illness.     All other systems reviewed and are negative.  EKGs/Labs/Other Studies Reviewed:    The following studies were reviewed today: recoreds from Dr. Prescott Gum  EKG:  EKG is not  ordered today.   Recent Labs: 01/11/2017: ALT 5; BUN 22; Creatinine, Ser 0.84; Hemoglobin 12.9; Platelets 316.0; Potassium 3.6; Sodium 139; TSH 2.68  Recent Lipid Panel    Component Value Date/Time   CHOL 182 01/11/2017 1228   TRIG 82.0 01/11/2017 1228   TRIG 78 07/04/2006 1313   HDL 58.70 01/11/2017 1228   CHOLHDL 3 01/11/2017 1228   VLDL 16.4 01/11/2017 1228   LDLCALC 107 (H)  01/11/2017 1228    Physical Exam:    VS:  BP 128/78   Pulse 92   Ht 5\' 2"  (1.575 m)   Wt 153 lb 9.6 oz (69.7 kg)   LMP 07/19/1988   SpO2 98%   BMI 28.09 kg/m     Wt Readings from Last 3 Encounters:  03/01/17 153 lb 9.6 oz (69.7 kg)  02/08/17 152 lb 9.6 oz (69.2 kg)  01/11/17 153 lb (69.4 kg)     GEN:  Well nourished, well developed in no acute distress Slow movements ( parkinsons)  HEENT: Normal NECK: No JVD; No carotid bruits LYMPHATICRR  2/6 systolic and 2-3 / 6 diastolic murmur  RESPIRATORY:  Clear to auscultation without rales, wheezing or rhonchi  ABDOMEN: Soft, non-tender, non-distended MUSCULOSKELETAL:  No edema; No deformity  SKIN: Warm and dry NEUROLOGIC:  Alert and oriented x 3,     PSYCHIATRIC:  Slightly flat affect  ASSESSMENT:    No diagnosis found. PLAN:    In order of problems listed above:  1. Chronic systolic CHF She has newly diagnosed congestive heart failure. Her previous echocardiogram shows a normal left ventricular systolic function.   In reviewing her echocardiogram, she has akinesis of the anterior septum. She may have had a  anterior wall myocardial infarction.  We will schedule her for a right and left heart catheterization. I discussed the risks, benefits, and options concerning heart catheterization.  We'll start her on Lasix 20 mg a day and potassium chloride 10 mEq a day. At this point I state start her on beta blocker or angiotensin blocker but this will need to be started soon.  We will have her return to see a PA or nurse practitioner in 1 month and I will see her in 2 months.   2. Aortic insufficiency - she has moderate aortic insufficiency. It appears to be moderate on echo. At this point I doubt that her ascending aortic aneurysm is of causing or continuing to this aortic insufficiency but this will need to be investigated further. We'll do an aortogram at the time of her heart cath   3. Mitral regurgitation -  moderate     Medication Adjustments/Labs and Tests Ordered: Current medicines are reviewed at length with the patient today.  Concerns regarding medicines are outlined above.  No orders of the defined types were placed in this encounter.  No orders of the defined types were placed in this encounter.   Signed, Mertie Moores, MD  03/01/2017 2:24 PM    [place]

## 2017-03-03 NOTE — Discharge Instructions (Signed)
Femoral Site Care °Refer to this sheet in the next few weeks. These instructions provide you with information about caring for yourself after your procedure. Your health care provider may also give you more specific instructions. Your treatment has been planned according to current medical practices, but problems sometimes occur. Call your health care provider if you have any problems or questions after your procedure. °What can I expect after the procedure? °After your procedure, it is typical to have the following: °· Bruising at the site that usually fades within 1-2 weeks. °· Blood collecting in the tissue (hematoma) that may be painful to the touch. It should usually decrease in size and tenderness within 1-2 weeks. ° °Follow these instructions at home: °· Take medicines only as directed by your health care provider. °· You may shower 24-48 hours after the procedure or as directed by your health care provider. Remove the bandage (dressing) and gently wash the site with plain soap and water. Pat the area dry with a clean towel. Do not rub the site, because this may cause bleeding. °· Do not take baths, swim, or use a hot tub until your health care provider approves. °· Check your insertion site every day for redness, swelling, or drainage. °· Do not apply powder or lotion to the site. °· Limit use of stairs to twice a day for the first 2-3 days or as directed by your health care provider. °· Do not squat for the first 2-3 days or as directed by your health care provider. °· Do not lift over 10 lb (4.5 kg) for 5 days after your procedure or as directed by your health care provider. °· Ask your health care provider when it is okay to: °? Return to work or school. °? Resume usual physical activities or sports. °? Resume sexual activity. °· Do not drive home if you are discharged the same day as the procedure. Have someone else drive you. °· You may drive 24 hours after the procedure unless otherwise instructed by  your health care provider. °· Do not operate machinery or power tools for 24 hours after the procedure or as directed by your health care provider. °· If your procedure was done as an outpatient procedure, which means that you went home the same day as your procedure, a responsible adult should be with you for the first 24 hours after you arrive home. °· Keep all follow-up visits as directed by your health care provider. This is important. °Contact a health care provider if: °· You have a fever. °· You have chills. °· You have increased bleeding from the site. Hold pressure on the site. °Get help right away if: °· You have unusual pain at the site. °· You have redness, warmth, or swelling at the site. °· You have drainage (other than a small amount of blood on the dressing) from the site. °· The site is bleeding, and the bleeding does not stop after 30 minutes of holding steady pressure on the site. °· Your leg or foot becomes pale, cool, tingly, or numb. °This information is not intended to replace advice given to you by your health care provider. Make sure you discuss any questions you have with your health care provider. °Document Released: 03/08/2014 Document Revised: 12/11/2015 Document Reviewed: 01/22/2014 °Elsevier Interactive Patient Education © 2018 [employer] ° °Radial Site Care °Refer to this sheet in the next few weeks. These instructions provide you with information about caring for yourself after your procedure. Your health   care provider may also give you more specific instructions. Your treatment has been planned according to current medical practices, but problems sometimes occur. Call your health care provider if you have any problems or questions after your procedure. °What can I expect after the procedure? °After your procedure, it is typical to have the following: °· Bruising at the radial site that usually fades within 1-2 weeks. °· Blood collecting in the tissue (hematoma) that may be  painful to the touch. It should usually decrease in size and tenderness within 1-2 weeks. ° °Follow these instructions at home: °· Take medicines only as directed by your health care provider. °· You may shower 24-48 hours after the procedure or as directed by your health care provider. Remove the bandage (dressing) and gently wash the site with plain soap and water. Pat the area dry with a clean towel. Do not rub the site, because this may cause bleeding. °· Do not take baths, swim, or use a hot tub until your health care provider approves. °· Check your insertion site every day for redness, swelling, or drainage. °· Do not apply powder or lotion to the site. °· Do not flex or bend the affected arm for 24 hours or as directed by your health care provider. °· Do not push or pull heavy objects with the affected arm for 24 hours or as directed by your health care provider. °· Do not lift over 10 lb (4.5 kg) for 5 days after your procedure or as directed by your health care provider. °· Ask your health care provider when it is okay to: °? Return to work or school. °? Resume usual physical activities or sports. °? Resume sexual activity. °· Do not drive home if you are discharged the same day as the procedure. Have someone else drive you. °· You may drive 24 hours after the procedure unless otherwise instructed by your health care provider. °· Do not operate machinery or power tools for 24 hours after the procedure. °· If your procedure was done as an outpatient procedure, which means that you went home the same day as your procedure, a responsible adult should be with you for the first 24 hours after you arrive home. °· Keep all follow-up visits as directed by your health care provider. This is important. °Contact a health care provider if: °· You have a fever. °· You have chills. °· You have increased bleeding from the radial site. Hold pressure on the site. °Get help right away if: °· You have unusual pain at the  radial site. °· You have redness, warmth, or swelling at the radial site. °· You have drainage (other than a small amount of blood on the dressing) from the radial site. °· The radial site is bleeding, and the bleeding does not stop after 30 minutes of holding steady pressure on the site. °· Your arm or hand becomes pale, cool, tingly, or numb. °This information is not intended to replace advice given to you by your health care provider. Make sure you discuss any questions you have with your health care provider. °Document Released: 08/07/2010 Document Revised: 12/11/2015 Document Reviewed: 01/21/2014 °Elsevier Interactive Patient Education © 2018 Else[employer] Inc[employer] ° ° °

## 2017-03-03 NOTE — Brief Op Note (Signed)
Brief Cardiac Catheterization Note  Date: 03/03/2017 Time: 12:43 PM  PATIENT:  Elizabeth Mccullough  76 y.o. female  PRE-OPERATIVE DIAGNOSIS:  acute on chronic systolic heart failure, aortic insufficiency  POST-OPERATIVE DIAGNOSIS:  Same  PROCEDURE:  Procedure(s): RIGHT/LEFT HEART CATH AND CORONARY ANGIOGRAPHY (N/A) Thoracic Aortogram (N/A)  SURGEON:  Surgeon(s) and Role:    Nelva Bush, MD - Primary  FINDINGS: 1. No angiographically significant coronary artery disease. 2. Moderate aortic regurgitation. 3. Mildly dilated ascending aorta, incompletely visualized. 4. Upper normal to mildly elevated left and right heart filling pressures. 5. Low normal Fick cardiac output/index.  RECOMMENDATIONS: 1. Medical therapy of NICM. Will start carvedilol 3.125 mg BID and have the patient f/u with Dr. Acie Fredrickson for further mangement.  Nelva Bush, MD [facility] Pager: [phone]

## 2017-03-03 NOTE — Interval H&P Note (Signed)
History and Physical Interval Note:  03/03/2017 10:51 AM  Elizabeth Mccullough  has presented today for cardiac catheterization, with the diagnosis of acute on chronic systolic heart failure and aortic insufficiency. The various methods of treatment have been discussed with the patient and family. After consideration of risks, benefits and other options for treatment, the patient has consented to  Procedure(s): RIGHT/LEFT HEART CATH AND CORONARY ANGIOGRAPHY (N/A) Thoracic Aortogram (N/A) as a surgical intervention .  The patient's history has been reviewed, patient examined, no change in status, stable for surgery.  I have reviewed the patient's chart and labs.  Questions were answered to the patient's satisfaction.    Cath Lab Visit (complete for each Cath Lab visit)  Clinical Evaluation Leading to the Procedure:   ACS: No.  Non-ACS:    Anginal Classification: CCS III (no chest pain; NYHA class III HF symptoms)  Anti-ischemic medical therapy: No Therapy  Non-Invasive Test Results: No non-invasive testing performed (severely reduced LVEF)  Prior CABG: No previous CABG  [name]

## 2017-03-04 ENCOUNTER — Encounter (HOSPITAL_COMMUNITY): Payer: Self-pay | Admitting: Internal Medicine

## 2017-03-07 ENCOUNTER — Encounter: Payer: Medicare Other | Admitting: Family Medicine

## 2017-03-31 ENCOUNTER — Ambulatory Visit (INDEPENDENT_AMBULATORY_CARE_PROVIDER_SITE_OTHER): Payer: Medicare Other | Admitting: Physician Assistant

## 2017-03-31 ENCOUNTER — Encounter: Payer: Self-pay | Admitting: Physician Assistant

## 2017-03-31 VITALS — BP 124/78 | HR 75 | Ht 62.0 in | Wt 150.0 lb

## 2017-03-31 DIAGNOSIS — I5042 Chronic combined systolic (congestive) and diastolic (congestive) heart failure: Secondary | ICD-10-CM

## 2017-03-31 DIAGNOSIS — I351 Nonrheumatic aortic (valve) insufficiency: Secondary | ICD-10-CM

## 2017-03-31 DIAGNOSIS — I712 Thoracic aortic aneurysm, without rupture, unspecified: Secondary | ICD-10-CM

## 2017-03-31 DIAGNOSIS — W57XXXA Bitten or stung by nonvenomous insect and other nonvenomous arthropods, initial encounter: Secondary | ICD-10-CM

## 2017-03-31 DIAGNOSIS — I428 Other cardiomyopathies: Secondary | ICD-10-CM | POA: Insufficient documentation

## 2017-03-31 DIAGNOSIS — I340 Nonrheumatic mitral (valve) insufficiency: Secondary | ICD-10-CM

## 2017-03-31 LAB — BASIC METABOLIC PANEL
BUN / CREAT RATIO: 24 (ref 12–28)
BUN: 19 mg/dL (ref 8–27)
CO2: 28 mmol/L (ref 20–29)
CREATININE: 0.78 mg/dL (ref 0.57–1.00)
Calcium: 9.3 mg/dL (ref 8.7–10.3)
Chloride: 101 mmol/L (ref 96–106)
GFR calc non Af Amer: 73 mL/min/{1.73_m2} (ref >59)
GFR, EST AFRICAN AMERICAN: 84 mL/min/{1.73_m2} (ref >59)
Glucose: 72 mg/dL (ref 65–99)
Potassium: 3.8 mmol/L (ref 3.5–5.2)
Sodium: 143 mmol/L (ref 134–144)

## 2017-03-31 MED ORDER — LOSARTAN POTASSIUM 25 MG PO TABS: 25.0000 mg | ORAL_TABLET | Freq: Every day | ORAL | 11 refills | Status: DC

## 2017-03-31 NOTE — Patient Instructions (Signed)
Medication Instructions:  Start Losartan 25 mg Once daily   Labwork: Today - BMET In 2 weeks - BMET  Testing/Procedures: None   Follow-Up: Dr. Liam Rogers as scheduled in October  Any Other Special Instructions Will Be Listed Below (If Applicable).  If you need a refill on your cardiac medications before your next appointment, please call your pharmacy.

## 2017-03-31 NOTE — Progress Notes (Signed)
Cardiology Office Note:    Date:  03/31/2017   ID:  Elizabeth Mccullough, DOB [birth year], MRN [id number]  PCP:  Ann Held, DO  Cardiologist:  Dr. Liam Rogers    Referring MD: Carollee Herter, Alferd Apa, *   Chief Complaint  Patient presents with  . Hospitalization Follow-up    s/p outpatient cardiac cath    History of Present Illness:    Elizabeth Mccullough is a 76 y.o. female with a hx of Parkinson's disease, systolic HF, HL, ascending thoracic aortic aneurysm. Recent echocardiogram demonstrated newly depressed LV function with an EF of 20-25%. Patient was evaluated by Dr. Acie Fredrickson. He reviewed her echocardiogram which seemed to demonstrate akinesis of anterior septum. Cardiac catheterization was recommended.  Cardiac catheterization demonstrated normal coronary arteries, normal pulmonary pressures, moderate aortic insufficiency and mild dilation of aortic root. Medical therapy was started for nonischemic cardiomyopathy and follow-up with cardiothoracic surgery was recommended for thoracic aortic aneurysm.  Elizabeth Mccullough returns for follow up.  She is here with her daughter whom I have seen for a stress test in the past.  Her daughter is a Therapist, sports.  The patient notes dyspnea on exertion that is overall stable.  She denies chest pain, syncope, significant LE edema.  She denies paroxysmal nocturnal dyspnea but her daughter insists that she wakes up out of breath at night.  She has chronic nausea, dizziness, headaches. She denies any bleeding issues.  She does note a possible bug bite on her R leg.   Prior CV studies:   The following studies were reviewed today:  Cardiac Catheterization 03/03/17 Normal coronary arteries Moderate AI, mildly dilated aortic root measuring 4 cm at the sinuses of Valsalva Mean RA 8, mean PA 18, mean PCWP 17, LVEDP 20  Echocardiogram 02/23/17 EF 20-25, diffuse HK, grade 1 diastolic dysfunction, moderate AI, moderate MR, mild TR, PASP 32  Carotid US 01/13/17    <50% right and left ICA - FU 31yrs  Past Medical History:  Diagnosis Date  . AAA (abdominal aortic aneurysm) ([place])   . Hyperlipidemia   . Parkinson's disease [facility])     Past Surgical History:  Procedure Laterality Date  . BREAST CYST ASPIRATION  1965   Right Breast  . HAMMER TOE SURGERY  04/10/02   Left Toe  . KNEE SURGERY Right 10/17/15   meniscus repair  . NASAL SEPTUM SURGERY  1980  . RIGHT/LEFT HEART CATH AND CORONARY ANGIOGRAPHY N/A 03/03/2017   Procedure: RIGHT/LEFT HEART CATH AND CORONARY ANGIOGRAPHY;  Surgeon: Nelva Bush, MD;  Location: [facility] [facility];  Service: Cardiovascular;  Laterality: N/A;  . THORACIC AORTOGRAM N/A 03/03/2017   Procedure: Thoracic Aortogram;  Surgeon: Nelva Bush, MD;  Location: [facility];  Service: Cardiovascular;  Laterality: N/A;    Current Medications: Current Meds  Medication Sig  . ALPRAZolam (XANAX) 0.25 MG tablet Take 1 tablet (0.25 mg total) by mouth at bedtime as needed for anxiety or sleep.  Marland Kitchen aspirin EC 81 MG tablet Take 1 tablet (81 mg total) by mouth daily.  . carbidopa-levodopa (SINEMET IR) 25-100 MG tablet Take 1 tablet by mouth 4 (four) times daily. (Patient taking differently: Take 1 tablet by mouth See admin instructions. 3-4 times daily (usually 3))  . carvedilol (COREG) 3.125 MG tablet Take 1 tablet (3.125 mg total) by mouth 2 (two) times daily.  . furosemide (LASIX) 20 MG tablet Take 1 tablet (20 mg total) by mouth daily.  Marland Kitchen lovastatin (MEVACOR) 20 MG tablet  Take 20 mg by mouth daily.  . magnesium hydroxide (PHILLIPS CHEWS) 311 MG CHEW Chew 311 mg by mouth every 4 (four) hours as needed.  . Multiple Vitamin (MULTIVITAMIN) tablet Take 1 tablet by mouth daily.  . potassium chloride (K-DUR) 10 MEQ tablet Take 1 tablet (10 mEq total) by mouth daily.  Marland Kitchen venlafaxine XR (EFFEXOR-XR) 150 MG 24 hr capsule TAKE 1 CAPSULE BY MOUTH DAILY WITH BREAKFAST.     Allergies:   Penicillins and Iohexol   Social History    Social History  . Marital status: Widowed    Spouse name: N/A  . Number of children: N/A  . Years of education: N/A   Occupational History  . retired    Social History Main Topics  . Smoking status: Never Smoker  . Smokeless tobacco: Never Used  . Alcohol use Yes     Comment: social wine   . Drug use: No  . Sexual activity: Not Currently    Partners: Male   Other Topics Concern  . None   Social History Narrative   Exercise-- no     Family Hx: The patient's family history includes Breast cancer in her unknown relative; Cancer in her sister; Cancer (age of onset: 39) in her sister; Colon cancer in her unknown relative; Diabetes in her sister; Heart disease in her mother; Hyperlipidemia in her brother, sister, and sister; Hypertension in her brother; Stroke in her brother.  ROS:   Please see the history of present illness.    Review of Systems  Cardiovascular: Positive for dyspnea on exertion and leg swelling.  Respiratory: Positive for cough.   Hematologic/Lymphatic: Bruises/bleeds easily.  Gastrointestinal: Positive for constipation and nausea.  Neurological: Positive for dizziness, headaches and loss of balance.  Psychiatric/Behavioral: Positive for depression. The patient is nervous/anxious.    All other systems reviewed and are negative.   EKGs/Labs/Other Test Reviewed:    EKG:  EKG is  ordered today.  The ekg ordered today demonstrates NSR, HR 75, normal axis, IVCD, LVH, T-wave inversions 1, 2, 3, V3-V6 (suspect LVH with repolarization abnormality)  Recent Labs: 01/11/2017: TSH 2.68 03/01/2017: ALT 6; BUN 20; Creatinine, Ser 0.80; Hemoglobin 12.3; Platelets 298; Potassium 4.1; Sodium 143   Recent Lipid Panel Lab Results  Component Value Date/Time   CHOL 182 01/11/2017 12:28 PM   TRIG 82.0 01/11/2017 12:28 PM   TRIG 78 07/04/2006 01:13 PM   HDL 58.70 01/11/2017 12:28 PM   CHOLHDL 3 01/11/2017 12:28 PM   LDLCALC 107 (H) 01/11/2017 12:28 PM    Physical  Exam:    VS:  BP 124/78   Pulse 75   Ht 5\' 2"  (1.575 m)   Wt 150 lb (68 kg)   LMP 07/19/1988   SpO2 96%   BMI 27.44 kg/m     Wt Readings from Last 3 Encounters:  03/31/17 150 lb (68 kg)  03/03/17 153 lb (69.4 kg)  03/01/17 153 lb 9.6 oz (69.7 kg)     Physical Exam  Constitutional: She is oriented to person, place, and time. She appears well-developed and well-nourished. No distress.  HENT:  Head: Normocephalic and atraumatic.  Eyes: No scleral icterus.  Neck: No JVD present.  Cardiovascular: Normal rate and regular rhythm.   Murmur heard.  Diastolic murmur is present with a grade of 3/6  at the lower left sternal border Pulmonary/Chest: Effort normal. She has no rales.  Abdominal: Soft. She exhibits no distension.  Musculoskeletal: She exhibits no edema or deformity (R groin  without hematoma or bruit).  Neurological: She is alert and oriented to person, place, and time.  Skin: Skin is warm and dry. Rash (R lower leg with probable insect bite with mild surrounding erythema) noted.  Psychiatric: She has a normal mood and affect.    ASSESSMENT:    1. Chronic combined systolic and diastolic CHF (congestive heart failure) ([place])   2. Thoracic aortic aneurysm without rupture ([place])   3. Nonrheumatic aortic valve insufficiency   4. Mitral valve insufficiency, unspecified etiology   5. Bug bite, initial encounter    PLAN:    In order of problems listed above:  1. Chronic combined systolic and diastolic CHF (congestive heart failure) ([facility])  NICM.  NYHA 2b.  LVEDP was mildly elevated at her Cardiac Catheterization.  She had only taken 1 dose of Lasix at that point.  She seems euvolemic on exam today.  She is on beta-blocker with Carvedilol.    -  Continue beta-blocker  -  Start Losartan 25 mg QD  -  BMET today  -  BMET in 2 weeks.  -  FU with Dr. Liam Rogers in October  -  Consider repeat Echo in 3 months to recheck EF  2. Thoracic aortic aneurysm without rupture  ([place]) Follow by Dr. Prescott Gum.  Aortic root was 4 cm at her Cardiac Catheterization.    3. Nonrheumatic aortic valve insufficiency Moderate by echo and Cardiac Catheterization.    4. Mitral valve insufficiency, unspecified etiology Mod by recent echo.  5. Bug bite, initial encounter   Keep clean.  Use topical antibiotics.  FU with PCP if any change/worsening.   Dispo:  Return in 6 weeks (on 05/10/2017) for Scheduled Follow Up, w/ Dr. Acie Fredrickson.   Medication Adjustments/Labs and Tests Ordered: Current medicines are reviewed at length with the patient today.  Concerns regarding medicines are outlined above.  Tests Ordered: Orders Placed This Encounter  Procedures  . Basic metabolic panel  . Basic metabolic panel  . EKG 12-Lead   Medication Changes: Meds ordered this encounter  Medications  . losartan (COZAAR) 25 MG tablet    Sig: Take 1 tablet (25 mg total) by mouth daily.    Dispense:  30 tablet    Refill:  11    Order Specific Question:   Supervising Provider    Answer:   Thompson Grayer [[id number]]    Signed, Richardson Dopp, PA-C  03/31/2017 1:58 PM    [facility] [address], [place], [place]  [address] Phone: [phone]; Fax: [phone]

## 2017-04-01 ENCOUNTER — Ambulatory Visit: Payer: Medicare Other | Admitting: Physician Assistant

## 2017-04-01 ENCOUNTER — Telehealth: Payer: Self-pay | Admitting: *Deleted

## 2017-04-01 NOTE — Telephone Encounter (Signed)
Pt has been notified of lab results by phone with verbal understanding. Pt thanked me for my call today.   

## 2017-04-01 NOTE — Telephone Encounter (Signed)
-----   Message from Liliane Shi, [place] sent at 04/01/2017  9:09 AM EDT ----- Please call patient: The kidney function (BUN, Creatinine) and potassium are normal. All other parameters are within acceptable limits and no further intervention or testing required. Continue with current treatment plan. Richardson Dopp, PA-C    04/01/2017 9:08 AM

## 2017-04-18 ENCOUNTER — Other Ambulatory Visit: Payer: Medicare Other

## 2017-04-18 DIAGNOSIS — I5042 Chronic combined systolic (congestive) and diastolic (congestive) heart failure: Secondary | ICD-10-CM

## 2017-04-18 LAB — BASIC METABOLIC PANEL
BUN / CREAT RATIO: 24 (ref 12–28)
BUN: 19 mg/dL (ref 8–27)
CO2: 29 mmol/L (ref 20–29)
CREATININE: 0.79 mg/dL (ref 0.57–1.00)
Calcium: 8.9 mg/dL (ref 8.7–10.3)
Chloride: 101 mmol/L (ref 96–106)
GFR calc Af Amer: 83 mL/min/{1.73_m2} (ref >59)
GFR, EST NON AFRICAN AMERICAN: 72 mL/min/{1.73_m2} (ref >59)
Glucose: 92 mg/dL (ref 65–99)
Potassium: 3.9 mmol/L (ref 3.5–5.2)
SODIUM: 141 mmol/L (ref 134–144)

## 2017-04-19 ENCOUNTER — Telehealth: Payer: Self-pay | Admitting: *Deleted

## 2017-04-19 NOTE — Telephone Encounter (Signed)
-----   Message from Liliane Shi, [place] sent at 04/18/2017 10:03 PM EDT ----- Please call patient: The kidney function (BUN, Creatinine) and potassium are normal. All other parameters are within acceptable limits and no further intervention or testing required. Continue with current treatment plan. Richardson Dopp, PA-C    04/18/2017 10:03 PM

## 2017-04-19 NOTE — Telephone Encounter (Signed)
Pt has been notified of lab results by phone with verbal understanding. Pt thanked me for my call.  

## 2017-05-02 ENCOUNTER — Other Ambulatory Visit: Payer: Self-pay | Admitting: Family Medicine

## 2017-05-02 DIAGNOSIS — F329 Major depressive disorder, single episode, unspecified: Secondary | ICD-10-CM

## 2017-05-02 DIAGNOSIS — F32A Depression, unspecified: Secondary | ICD-10-CM

## 2017-05-10 ENCOUNTER — Encounter: Payer: Self-pay | Admitting: Cardiovascular Disease

## 2017-05-10 ENCOUNTER — Ambulatory Visit (INDEPENDENT_AMBULATORY_CARE_PROVIDER_SITE_OTHER): Payer: Medicare Other | Admitting: Cardiovascular Disease

## 2017-05-10 VITALS — BP 116/62 | HR 77 | Ht 62.0 in | Wt 151.6 lb

## 2017-05-10 DIAGNOSIS — I5042 Chronic combined systolic (congestive) and diastolic (congestive) heart failure: Secondary | ICD-10-CM | POA: Diagnosis not present

## 2017-05-10 DIAGNOSIS — I351 Nonrheumatic aortic (valve) insufficiency: Secondary | ICD-10-CM

## 2017-05-10 MED ORDER — CARVEDILOL 6.25 MG PO TABS: 6.2500 mg | ORAL_TABLET | Freq: Two times a day (BID) | ORAL | 3 refills | Status: DC

## 2017-05-10 NOTE — Patient Instructions (Signed)
Medication Instructions:  Your physician has recommended you make the following change in your medication:  INCREASE Carvedilol (Coreg) to 6.25 mg twice daily   Labwork: None Ordered   Testing/Procedures: None Ordered   Follow-Up: Your physician recommends that you schedule a follow-up appointment in: 3 months with Dr. Acie Fredrickson   If you need a refill on your cardiac medications before your next appointment, please call your pharmacy.   Thank you for choosing [facility]! Christen Bame, RN [phone]

## 2017-05-10 NOTE — Progress Notes (Signed)
Cardiology Office Note:    Date:  05/10/2017   ID:  JIMEKA BALAN, DOB [birth year], MRN [id number]  PCP:  Carollee Herter, Alferd Apa, DO  Cardiologist:  Mertie Moores, MD    Referring MD: Carollee Herter, Alferd Apa, *   Problem list 1. 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 Hyperlipidemia 3.  Parkinson's disease 4.  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 3  5.  Mild carotid artery disease  Chief Complaint  Patient presents with  . Congestive Heart Failure    History of Present Illness:    Elizabeth Mccullough is a 76 y.o. female with a hx of Parkinsons, hyperlipidemia . 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 Recent echo shows new EF of 20-25%.  Previous echo in 2013  showed EF 55-60%.  Increased shortness of breath for the past several months .   No PND or orthopnea. No leg swelling .    Does not eat much salt  No CP.  Not able to exercise much secondary to frequent falling and parkinson .  Walks with a cane.   Oct. 23, 2018:     Martha had a cath since her last office visit Normal coronaries. 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 She is followed by Dr. Darcey Nora.   Was started on Coreg.   Is not as short of breath as she was previoiusly  She eats a very low-salt diet.   Past Medical History:  Diagnosis Date  . AAA (abdominal aortic aneurysm) ([place])   . Hyperlipidemia   . Parkinson's disease [facility])     Past Surgical History:  Procedure Laterality Date  . BREAST CYST ASPIRATION  1965   Right Breast  . HAMMER TOE SURGERY  04/10/02   Left Toe  . KNEE SURGERY Right 10/17/15   meniscus repair  . NASAL SEPTUM SURGERY  1980  . RIGHT/LEFT HEART CATH AND CORONARY ANGIOGRAPHY N/A 03/03/2017   Procedure: RIGHT/LEFT HEART CATH AND CORONARY ANGIOGRAPHY;  Surgeon: Nelva Bush, MD;  Location: [facility];  Service:  Cardiovascular;  Laterality: N/A;  . THORACIC AORTOGRAM N/A 03/03/2017   Procedure: Thoracic Aortogram;  Surgeon: Nelva Bush, MD;  Location: [place];  Service: Cardiovascular;  Laterality: N/A;    Current Medications: Current Meds  Medication Sig  . ALPRAZolam (XANAX) 0.25 MG tablet Take 1 tablet (0.25 mg total) by mouth at bedtime as needed for anxiety or sleep.  Marland Kitchen aspirin EC 81 MG tablet Take 1 tablet (81 mg total) by mouth daily.  . carbidopa-levodopa (SINEMET IR) 25-100 MG tablet Take 1 tablet by mouth 4 (four) times daily. (Patient taking differently: Take 1 tablet by mouth See admin instructions. 3-4 times daily (usually 3))  . carvedilol (COREG) 3.125 MG tablet Take 1 tablet (3.125 mg total) by mouth 2 (two) times daily.  . furosemide (LASIX) 20 MG tablet Take 1 tablet (20 mg total) by mouth daily.  Marland Kitchen losartan (COZAAR) 25 MG tablet Take 1 tablet (25 mg total) by mouth daily.  Marland Kitchen lovastatin (MEVACOR) 20 MG tablet Take 20 mg by mouth daily.  . Multiple Vitamin (MULTIVITAMIN) tablet Take 1 tablet by mouth daily.  . potassium chloride (K-DUR) 10 MEQ tablet Take 1 tablet (10 mEq total) by mouth daily.  Marland Kitchen venlafaxine XR (EFFEXOR-XR) 150 MG 24 hr capsule TAKE 1 CAPSULE BY MOUTH DAILY  WITH BREAKFAST.  . [DISCONTINUED] magnesium hydroxide (PHILLIPS CHEWS) 311 MG CHEW Chew 311 mg by mouth every 4 (four) hours as needed.     Allergies:   Penicillins and Iohexol   Social History   Social History  . Marital status: Widowed    Spouse name: N/A  . Number of children: N/A  . Years of education: N/A   Occupational History  . retired    Social History Main Topics  . Smoking status: Never Smoker  . Smokeless tobacco: Never Used  . Alcohol use Yes     Comment: social wine   . Drug use: No  . Sexual activity: Not Currently    Partners: Male   Other Topics Concern  . None   Social History Narrative   Exercise-- no     Family History: The patient's family history  includes Breast cancer in her unknown relative; Cancer in her sister; Cancer (age of onset: 14) in her sister; Colon cancer in her unknown relative; Diabetes in her sister; Heart disease in her mother; Hyperlipidemia in her brother, sister, and sister; Hypertension in her brother; Stroke in her brother. ROS:   Please see the history of present illness.     All other systems reviewed and are negative.  EKGs/Labs/Other Studies Reviewed:    The following studies were reviewed today: recoreds from Dr. Prescott Gum  EKG:  EKG is not  ordered today.   Recent Labs: 01/11/2017: TSH 2.68 03/01/2017: ALT 6; Hemoglobin 12.3; Platelets 298 04/18/2017: BUN 19; Creatinine, Ser 0.79; Potassium 3.9; Sodium 141  Recent Lipid Panel    Component Value Date/Time   CHOL 182 01/11/2017 1228   TRIG 82.0 01/11/2017 1228   TRIG 78 07/04/2006 1313   HDL 58.70 01/11/2017 1228   CHOLHDL 3 01/11/2017 1228   VLDL 16.4 01/11/2017 1228   LDLCALC 107 (H) 01/11/2017 1228    Physical Exam:    VS:  BP 116/62   Pulse 77   Ht 5\' 2"  (1.575 m)   Wt 151 lb 9.6 oz (68.8 kg)   LMP 07/19/1988   SpO2 98%   BMI 27.73 kg/m     Wt Readings from Last 3 Encounters:  05/10/17 151 lb 9.6 oz (68.8 kg)  03/31/17 150 lb (68 kg)  03/03/17 153 lb (69.4 kg)     Physical Exam: Blood pressure 116/62, pulse 77, height 5\' 2"  (1.575 m), weight 151 lb 9.6 oz (68.8 kg), last menstrual period 07/19/1988, SpO2 98 %.  GEN: No acute distress. She has a slight tremor. HEENT: Normal NECK: No JVD; No carotid bruits LYMPHATICS: No lymphadenopathy CARDIAC:  Regular rate, normal S1-S2. She has a soft systolic murmur. There is a 2/6 diastolic murmur at the left sternal border. RESPIRATORY:  Clear to auscultation without rales, wheezing or rhonchi  ABDOMEN: Soft, non-tender, non-distended MUSCULOSKELETAL:  No edema; No deformity  SKIN: Warm and dry NEUROLOGIC:  Alert and oriented x 3.  Affect is slightly flat. She has a slight tremor due to  Parkinson's    ASSESSMENT:    No diagnosis found. PLAN:    In order of problems listed above:  1. Chronic combined systolic / iastolic CHF:   She is currently on low-dose carvedilol and losartan. She had no coronary artery disease by heart cath.We'll increase the carvedilol to 6.25 mg twice a day.  I'll see her in 3 months.   2. Aortic insufficiency -  She has moderate aortic insufficiency. She has mild aortic root dilatation. We will  continue to follow.  3. Mitral regurgitation - moderate  4. Aortic root dilatation: She's been followed by Dr. Prescott Gum.    Medication Adjustments/Labs and Tests Ordered: Current medicines are reviewed at length with the patient today.  Concerns regarding medicines are outlined above.  No orders of the defined types were placed in this encounter.  No orders of the defined types were placed in this encounter.   Signed, Mertie Moores, MD  05/10/2017 9:45 AM    [place]

## 2017-05-16 DIAGNOSIS — Z23 Encounter for immunization: Secondary | ICD-10-CM | POA: Diagnosis not present

## 2017-05-27 NOTE — Progress Notes (Signed)
Elizabeth Mccullough was seen today in the movement disorders clinic for neurologic consultation at the request of Ann Held, DO.  The consultation is for the evaluation of tremor.  Prior medical records were reviewed.  She is accompanied by her friend who supplements the hx.  The pt noted that she began to have tremor about a year ago.  It has increased over the year.  I noted in med records that the pt went to the ED on 04/10/13 after a fall.  Her sandal had caught on the sidewalk and she had to have stitches in her finger.  07/05/13 update:  The patient is following up today regarding her Parkinson's disease, which was just diagnosed last visit, in October 2014.  I started her on levodopa and referred her to the Parkinson's program.  The pt reports that she is taking OTC sleep medication which is helping.  She initially had nausea with the carbidopa/levodopa but is doing much better.  She really isn't sure if the nausea was from the carbidopa/levodopa 25/100 or from the antidepressant she had started but she feels good now.  She does state that she fell once since last visit.  She says that she just "wasn't looking down" and tripped walking around a car door. She insists that she is doing markedly better.  She started driving again.  "I feel almost normal."  She was supposed to go to physical therapy since our last visit, but admits that she had the initial evaluation and 2 subsequent visits, but this was when she was very nauseated and just could not tolerate the therapy.  She really is not doing any outside exercise. I did review records from other physicians since her last visit.  She had a lower extremity venous Doppler that did not demonstrate DVT but did demonstrate a rather extensive superficial thrombus that extended from above the knee to almost the ankle.  11/06/13 update:  Pt is on carbidopa/levodopa 25/100 three times per day.  She is feeling better.  No hallucinations.  No falls.   "I can walk, I can write, my mood is good."  She is tolerating the effexor well.  She is not exercising.    05/06/14 update:  Pt is f/u today re: PD. She is on carbidopa/levodopa 25/100 tid (7am/12pm/5pm).  She is on effexor for her mood.  She is feeling well.  She reports a few falls since last visit.  One was at her sons house.  She was going up the stairs and was carrying food and fell on her knees.  She had one other minor fall.  No hallucinations.  No lightheadedness or near syncope.  She is not exercising.  She notes that she has some trouble raising the R arm.   02/20/15 update:  The patient is following up today.  I have not seen her since October, 2015.  She had an appointment in April that she canceled because she reported that she was moving.  She is currently on carbidopa/levodopa 25/100, one tablet 3 times per day.   She is tolerating that well.  She remains on Effexor for anxiety and depression.  I reviewed records available to me since last visit.  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  She does not need a follow-up with them for another year and a half.  She is not exercising faithfully.  She does have some tremor of the L hand if nervous or if she is trying  to put on eyebrow pencil.  Wearing off:  No.  How long before next dose:  n/a Falls:   Yes.  , last week; fell over a pillow on the floor; did not get hurt N/V:  No. Hallucinations:  No.  visual distortions: No. Lightheaded:  No.  Syncope: No. Dyskinesia:  No.  11/14/15 update:  The patient is following up today.  I have not seen her since August, 2016.  She has a history of Parkinson's disease and is on carbidopa/levodopa 25/100, one tablet 3 times per day.  More tremor when she drives.  No lightheadedness.  No hallucinations.  She does have a history of depression and is on Effexor.  At our last visit, she reported that she was off of benzodiazepines, but states today that she is back on  Xanax. States that she really isn't taking that.  States that she only takes it if she goes on plane, etc. I did have the opportunity to review records since last visit.  She went to the emergency room on January 12 after having a fall.  She was attempting to get on the bed and fell and hit her head.  This did require stitches.  States that she had about 3 falls in total.    05/03/16 update:  The patient follows up today.  She has history of Parkinson's disease and last visit I increased her carbidopa/levodopa 25/100, so that she was taking one tablet 4 times a day instead of one tablet 3 times per day.  I asked her to take it at 7 AM/11 AM/3 PM/7 PM.  She states that about 30% of the time she takes it qid and about 70% of the time she takes it qid.  She had a fall about 2 months ago.  She was walking down her granddaughters patio and she fell on the L knee.  She is walking the track some for exercise but mostly is doing knee/leg stretches.  She denies lightheadedness or near syncope.  She denies hallucinations.  Has occasional nausea.    11/26/16 update:  Patient seen today in follow-up.  I have not seen her in about 7 months.  She is prescribed carbidopa/levodopa 25/100, one tablet 4 times per day but generally only takes it tid.  States "everything is getting harder for me to do."  She has had "lots and lots" of falls since our last visit.  States that sometimes the tip of her shoe will get caught on her rug and she will fall.  She denies lightheadedness or near syncope.  She denies hallucinations.  No significant exercise (walks to mailbox, etc).    05/30/17 update: Patient seen today in follow-up for her Parkinson's disease. She is accompanied by her daughter who supplements the history.   The patient is prescribed carbidopa/levodopa 25/100, 1 tablet at 7 AM/11 AM/3 PM/7 PM.  She still has trouble taking it and patient states that "I try to take it morning, noon, night."  Daughter states that she isn't  taking it as directed.  Daughter wonders if she has PD.  States that patients husband had PSP and her daughter wonders if she didn't take on the sx's of her husband.  Daughter asks about marijuana and if I can prescribe in a pill. She was in the hospital in August.  She had a heart cath that demonstrated normal coronary arteries.  She does have depressed LV function.  PREVIOUS MEDICATIONS: none to date  ALLERGIES:   Allergies  Allergen  Reactions  . Penicillins Hives  . Iohexol Rash     Code: RASH, Desc: PATIENT STATES SHE IS ALLERGIC TO IV DYE. 20 YRS AGO SHE HAD A REACTION AT [facility], PT WAS GIVEN BENADRYL. 07/13/06/RM, Onset Date: [id number]     CURRENT MEDICATIONS:  Current Outpatient Medications on File Prior to Visit  Medication Sig Dispense Refill  . ALPRAZolam (XANAX) 0.25 MG tablet Take 1 tablet (0.25 mg total) by mouth at bedtime as needed for anxiety or sleep. 30 tablet 0  . aspirin EC 81 MG tablet Take 1 tablet (81 mg total) by mouth daily.    . carbidopa-levodopa (SINEMET IR) 25-100 MG tablet Take 1 tablet by mouth 4 (four) times daily. (Patient taking differently: Take 1 tablet by mouth See admin instructions. 3-4 times daily (usually 3)) 360 tablet 1  . carvedilol (COREG) 6.25 MG tablet Take 1 tablet (6.25 mg total) by mouth 2 (two) times daily. 180 tablet 3  . furosemide (LASIX) 20 MG tablet Take 1 tablet (20 mg total) by mouth daily. 90 tablet 3  . losartan (COZAAR) 25 MG tablet Take 1 tablet (25 mg total) by mouth daily. 30 tablet 11  . lovastatin (MEVACOR) 20 MG tablet Take 20 mg by mouth daily.    . Multiple Vitamin (MULTIVITAMIN) tablet Take 1 tablet by mouth daily.    . potassium chloride (K-DUR) 10 MEQ tablet Take 1 tablet (10 mEq total) by mouth daily. 90 tablet 3  . venlafaxine XR (EFFEXOR-XR) 150 MG 24 hr capsule TAKE 1 CAPSULE BY MOUTH DAILY WITH BREAKFAST. 90 capsule 0   No current facility-administered medications on file prior to visit.     PAST MEDICAL  HISTORY:   Past Medical History:  Diagnosis Date  . AAA (abdominal aortic aneurysm) ([place])   . Hyperlipidemia   . Parkinson's disease ([place])     PAST SURGICAL HISTORY:   Past Surgical History:  Procedure Laterality Date  . BREAST CYST ASPIRATION  1965   Right Breast  . HAMMER TOE SURGERY  04/10/02   Left Toe  . KNEE SURGERY Right 10/17/15   meniscus repair  . NASAL SEPTUM SURGERY  1980    SOCIAL HISTORY:   Social History   Socioeconomic History  . Marital status: Widowed    Spouse name: Not on file  . Number of children: Not on file  . Years of education: Not on file  . Highest education level: Not on file  Social Needs  . Financial resource strain: Not on file  . Food insecurity - worry: Not on file  . Food insecurity - inability: Not on file  . Transportation needs - medical: Not on file  . Transportation needs - non-medical: Not on file  Occupational History  . Occupation: retired  Tobacco Use  . Smoking status: Never Smoker  . Smokeless tobacco: Never Used  Substance and Sexual Activity  . Alcohol use: Yes    Comment: social wine   . Drug use: No  . Sexual activity: Not Currently    Partners: Male  Other Topics Concern  . Not on file  Social History Narrative   Exercise-- no    FAMILY HISTORY:   Family Status  Relation Name Status  . Mother  Deceased at age 31       chf  . Father  Deceased at age 1       addisons  . Sister  Alive       15  . Brother  Deceased  at age birth  . Sister  Alive       61  . Sister  Deceased  . Sister  Deceased at age 51  . Brother  Deceased  . Brother  Deceased at age 67       bleeding ulcer, heart  . Brother  Deceased at age 73       pneumonia  . Unknown  (Not Specified)  . MGM  Deceased  . MGF  Deceased  . PGM  Deceased  . PGF  Deceased    ROS:  A complete 10 system review of systems was obtained and was unremarkable apart from what is mentioned above.  PHYSICAL EXAMINATION:    VITALS:   Vitals:    05/30/17 1341  BP: 118/64  Pulse: 86  Weight: 151 lb (68.5 kg)  Height: 5' 1.5" (1.562 m)   Wt Readings from Last 3 Encounters:  05/30/17 151 lb (68.5 kg)  05/10/17 151 lb 9.6 oz (68.8 kg)  03/31/17 150 lb (68 kg)    GEN:  The patient appears stated age and is in NAD. HEENT:  Normocephalic, atraumatic.  The mucous membranes are moist. The superficial temporal arteries are without ropiness or tenderness. CV:  RRR Lungs:  CTAB Neck/HEME:  There are no carotid bruits bilaterally.  Neurological examination:  Orientation: The patient is alert and oriented x3.  Cranial nerves: There is good facial symmetry.  No significant facial hypomimia.  The speech is fluent and clear.  It is hypophonic.  The patient is able to make the gutteral sounds without difficulty.  Soft palate rises symmetrically and there is no tongue deviation. Hearing is intact to conversational tone. Sensation: Sensation is intact to light throughout. Motor: Strength is 5/5 in the bilateral upper and lower extremities.   Shoulder shrug is equal and symmetric.  There is no pronator drift.  Movement examination: Tone: There is mod increased tone in the right upper extremity.  The left is mild to mod increased. Abnormal movements: There is no tremor today Coordination:  There is decremation with any form of RAMS, including alternating supination and pronation of the forearm, hand opening and closing, finger taps, heel taps and toe taps bilaterally  Gait and Station: The patient has trouble arising without the use of the hands and it takes 3 attempts.  She has decreased arm swing bilaterally.    ASSESSMENT/PLAN:  1.  Parkinson's disease, diagnosed in 04/20/2013.  -she is having trouble remembering to take med 4 times a day and compliance has been an issue.  Her daughter (a Marine scientist) hasn't been here before and discussed why she met criteria for the disease.  I think that the biggest issue is noncompliance with medication and that  is why they think it doesn't work.  Tried to get her to take more frequently but haven't been successful.  She will change dosing to 2 at 9am/1 at 1pm/1 at 5 pm  -The patients daughter asked me about CBD oil/marijuana.  Discussed literature on that as it relates to Parkinson's disease.  Not recommended by [facility] because of lack of controlled trials.  Talked about smaller trials in which marijuana helped tremor.  However, there is some data that suggests that it worsens cognition and falls.  The data also suggests that CBD oil is less effective than marijuana.  However, there have been concerns given the fact that CBD oil is unregulated and each manufacturer has different amounts of ingredient and the purity of each manufacturers ingredient has  been called into question.  At this time, it is not recommended for the treatment of Parkinson's disease.  Further studies do need to be completed.  -talked to her about compliance with f/u visits.  Told her would not continue to RF medication beyond 6 months.  Told her that i would prefer to see her every 3-4 months but she refused.  -I encouraged her again to exercise.   -refuses PT.  States that she just finished PT recently.    -talked to her about community exercise programs but she would need approval from cardiology from that  -refuses OT driving.  Told her that I don't want her driving without going through that.    -had her meet our Education officer, museum.    2.  Anxiety and ? Depression  -I think that she could use a Social worker.  She refuses.  She is on effexor. 3.  Lightheadedness  -resolved. 4.  F/u in 6 months, sooner should new neurologic issues arise.

## 2017-05-30 ENCOUNTER — Ambulatory Visit: Payer: Medicare Other | Admitting: Neurology

## 2017-05-30 ENCOUNTER — Encounter: Payer: Self-pay | Admitting: Psychology

## 2017-05-30 ENCOUNTER — Encounter: Payer: Self-pay | Admitting: Neurology

## 2017-05-30 VITALS — BP 118/64 | HR 86 | Ht 61.5 in | Wt 151.0 lb

## 2017-05-30 DIAGNOSIS — F330 Major depressive disorder, recurrent, mild: Secondary | ICD-10-CM

## 2017-05-30 DIAGNOSIS — G20 Parkinson's disease: Secondary | ICD-10-CM | POA: Diagnosis not present

## 2017-05-30 NOTE — Patient Instructions (Signed)
Take carbidopa/levodopa 25/100, 2 at 9am/1 at 1pm/1 at 5pm

## 2017-05-30 NOTE — Progress Notes (Signed)
I met with the patient and her daughter/caregiver while they were in the clinic today. I reviewed some of the local Parkinson's resources that are available. I highlighted the [employer] [facility], [facility] and [employer].   We talked a little about depression, anxiety and apathy. Patient reports that she plays cards with friends on Tuesdays and goes out with friends that are widowed for lunch once a month. She reports some anxiety. I provided information on [facility] as they will provide services at the home. Patient's daughter will be going back to work full time in a few weeks.   Patient will be starting taking a different medication regimen. We discussed contacting the office is she is having trouble with taking the medication (missing doses). Patient is planning on using an alarm system and taking medication 30 min prior to her meals.

## 2017-06-04 ENCOUNTER — Other Ambulatory Visit: Payer: Self-pay | Admitting: Neurology

## 2017-06-13 ENCOUNTER — Encounter: Payer: Self-pay | Admitting: Family Medicine

## 2017-06-13 ENCOUNTER — Ambulatory Visit: Payer: Medicare Other | Admitting: Family Medicine

## 2017-06-13 VITALS — BP 110/70 | HR 78 | Temp 98.2°F | Resp 16 | Ht 61.5 in | Wt 151.8 lb

## 2017-06-13 DIAGNOSIS — R300 Dysuria: Secondary | ICD-10-CM

## 2017-06-13 DIAGNOSIS — N390 Urinary tract infection, site not specified: Secondary | ICD-10-CM | POA: Diagnosis not present

## 2017-06-13 DIAGNOSIS — R319 Hematuria, unspecified: Secondary | ICD-10-CM

## 2017-06-13 LAB — POC URINALSYSI DIPSTICK (AUTOMATED)
Bilirubin, UA: NEGATIVE
GLUCOSE UA: NEGATIVE
Ketones, UA: NEGATIVE
NITRITE UA: POSITIVE
Protein, UA: 15
UROBILINOGEN UA: 0.2 U/dL
pH, UA: 6 (ref 5.0–8.0)

## 2017-06-13 MED ORDER — SULFAMETHOXAZOLE-TRIMETHOPRIM 800-160 MG PO TABS: 1.0000 | ORAL_TABLET | Freq: Two times a day (BID) | ORAL | 0 refills | Status: DC

## 2017-06-13 NOTE — Patient Instructions (Signed)

## 2017-06-13 NOTE — Progress Notes (Signed)
Patient ID: Elizabeth Mccullough, female   DOB: 1940/07/26, 76 y.o.   MRN: [id number]     Subjective:  I acted as a Education administrator for Dr. Carollee Herter.  Elizabeth Mccullough, Elizabeth Mccullough   Patient ID: Elizabeth Mccullough, female    DOB: 1941/06/08, 76 y.o.   MRN: [id number]  Chief Complaint  Patient presents with  . Dysuria  . Hematuria    HPI  Patient is in today for dysuria and hematuria.  Started Friday.  She has taken AZO.   Patient Care Team: Carollee Herter, Alferd Apa, DO as PCP - General Elizabeth Mccullough, Elizabeth Quail, DO as Consulting Physician (Neurology) Elizabeth Mccullough, Elizabeth Salina, MD as Consulting Physician (Cardiothoracic Surgery) Shon Hough, MD as Consulting Physician (Ophthalmology)   Past Medical History:  Diagnosis Date  . AAA (abdominal aortic aneurysm) ([place])   . Hyperlipidemia   . Parkinson's disease [facility])     Past Surgical History:  Procedure Laterality Date  . BREAST CYST ASPIRATION  1965   Right Breast  . HAMMER TOE SURGERY  04/10/02   Left Toe  . KNEE SURGERY Right 10/17/15   meniscus repair  . NASAL SEPTUM SURGERY  1980  . RIGHT/LEFT HEART CATH AND CORONARY ANGIOGRAPHY N/A 03/03/2017   Procedure: RIGHT/LEFT HEART CATH AND CORONARY ANGIOGRAPHY;  Surgeon: Nelva Bush, MD;  Location: [facility];  Service: Cardiovascular;  Laterality: N/A;  . THORACIC AORTOGRAM N/A 03/03/2017   Procedure: Thoracic Aortogram;  Surgeon: Nelva Bush, MD;  Location: [facility];  Service: Cardiovascular;  Laterality: N/A;    Family History  Problem Relation Age of Onset  . Heart disease Mother   . Hyperlipidemia Sister   . Hyperlipidemia Brother   . Diabetes Sister   . Hyperlipidemia Sister   . Cancer Sister 40       colon  . Cancer Sister        breast  . Stroke Brother   . Hypertension Brother   . Breast cancer Unknown   . Colon cancer Unknown     Social History   Socioeconomic History  . Marital status: Widowed    Spouse name: Not on file  . Number of children: Not on file  .  Years of education: Not on file  . Highest education level: Not on file  Social Needs  . Financial resource strain: Not on file  . Food insecurity - worry: Not on file  . Food insecurity - inability: Not on file  . Transportation needs - medical: Not on file  . Transportation needs - non-medical: Not on file  Occupational History  . Occupation: retired  Tobacco Use  . Smoking status: Never Smoker  . Smokeless tobacco: Never Used  Substance and Sexual Activity  . Alcohol use: Yes    Comment: social wine   . Drug use: No  . Sexual activity: Not Currently    Partners: Male  Other Topics Concern  . Not on file  Social History Narrative   Exercise-- no    Outpatient Medications Prior to Visit  Medication Sig Dispense Refill  . ALPRAZolam (XANAX) 0.25 MG tablet Take 1 tablet (0.25 mg total) by mouth at bedtime as needed for anxiety or sleep. 30 tablet 0  . aspirin EC 81 MG tablet Take 1 tablet (81 mg total) by mouth daily.    . carbidopa-levodopa (SINEMET IR) 25-100 MG tablet Take 1 tablet by mouth 4 (four) times daily. (Patient taking differently: Take 1 tablet by mouth See admin instructions. 3-4 times daily (  usually 3)) 360 tablet 1  . carbidopa-levodopa (SINEMET IR) 25-100 MG tablet 2 at 9am/1 at 1pm/1 at 5pm 360 tablet 1  . carvedilol (COREG) 6.25 MG tablet Take 1 tablet (6.25 mg total) by mouth 2 (two) times daily. 180 tablet 3  . furosemide (LASIX) 20 MG tablet Take 1 tablet (20 mg total) by mouth daily. 90 tablet 3  . losartan (COZAAR) 25 MG tablet Take 1 tablet (25 mg total) by mouth daily. 30 tablet 11  . lovastatin (MEVACOR) 20 MG tablet Take 20 mg by mouth daily.    . Multiple Vitamin (MULTIVITAMIN) tablet Take 1 tablet by mouth daily.    . potassium chloride (K-DUR) 10 MEQ tablet Take 1 tablet (10 mEq total) by mouth daily. 90 tablet 3  . venlafaxine XR (EFFEXOR-XR) 150 MG 24 hr capsule TAKE 1 CAPSULE BY MOUTH DAILY WITH BREAKFAST. 90 capsule 0   No facility-administered  medications prior to visit.     Allergies  Allergen Reactions  . Penicillins Hives  . Iohexol Rash     Code: RASH, Desc: PATIENT STATES SHE IS ALLERGIC TO IV DYE. 20 YRS AGO SHE HAD A REACTION AT [facility], PT WAS GIVEN BENADRYL. 07/13/06/RM, Onset Date: 96789381     Review of Systems  Constitutional: Negative for fever and malaise/fatigue.  HENT: Negative for congestion.   Eyes: Negative for blurred vision.  Respiratory: Negative for cough and shortness of breath.   Cardiovascular: Negative for chest pain, palpitations and leg swelling.  Gastrointestinal: Negative for vomiting.  Genitourinary: Positive for dysuria and hematuria. Negative for flank pain.  Musculoskeletal: Negative for back pain.  Skin: Negative for rash.  Neurological: Negative for loss of consciousness and headaches.       Objective:    Physical Exam  Constitutional: She is oriented to person, place, and time. She appears well-developed and well-nourished.  HENT:  Head: Normocephalic and atraumatic.  Eyes: Conjunctivae and EOM are normal.  Neck: Normal range of motion. Neck supple. No JVD present. Carotid bruit is not present. No thyromegaly present.  Cardiovascular: Normal rate, regular rhythm and normal heart sounds.  No murmur heard. Pulmonary/Chest: Effort normal and breath sounds normal. No respiratory distress. She has no wheezes. She has no rales. She exhibits no tenderness.  Abdominal: Soft. She exhibits no distension. There is no tenderness. There is no rebound.  Musculoskeletal: She exhibits no edema.  Neurological: She is alert and oriented to person, place, and time.  Psychiatric: She has a normal mood and affect.  Nursing note and vitals reviewed.   BP 110/70 (BP Location: Left Arm, Cuff Size: Normal)   Pulse 78   Temp 98.2 F (36.8 C) (Oral)   Resp 16   Ht 5' 1.5" (1.562 m)   Wt 151 lb 12.8 oz (68.9 kg)   LMP 07/19/1988   SpO2 98%   BMI 28.22 kg/m  Wt Readings from Last 3  Encounters:  06/13/17 151 lb 12.8 oz (68.9 kg)  05/30/17 151 lb (68.5 kg)  05/10/17 151 lb 9.6 oz (68.8 kg)   BP Readings from Last 3 Encounters:  06/13/17 110/70  05/30/17 118/64  05/10/17 116/62     Immunization History  Administered Date(s) Administered  . Influenza Split 05/11/2011, 05/10/2012  . Influenza Whole 05/16/2007, 04/16/2008, 05/19/2009, 04/29/2010  . Influenza, High Dose Seasonal PF 05/24/2013, 05/15/2015, 05/13/2016  . Influenza,inj,Quad PF,6+ Mos 04/29/2014  . Influenza-Unspecified 05/16/2017  . Pneumococcal Conjugate-13 01/16/2014  . Pneumococcal Polysaccharide-23 06/03/2004  . Td 07/05/2006  .  Tdap 04/10/2013    Health Maintenance  Topic Date Due  . MAMMOGRAM  01/05/2018  . TETANUS/TDAP  04/11/2023  . INFLUENZA VACCINE  Completed  . DEXA SCAN  Completed  . PNA vac Low Risk Adult  Completed    Lab Results  Component Value Date   WBC 6.7 03/01/2017   HGB 12.3 03/01/2017   HCT 37.6 03/01/2017   PLT 298 03/01/2017   GLUCOSE 92 04/18/2017   CHOL 182 01/11/2017   TRIG 82.0 01/11/2017   HDL 58.70 01/11/2017   LDLCALC 107 (H) 01/11/2017   ALT 6 03/01/2017   AST 17 03/01/2017   NA 141 04/18/2017   K 3.9 04/18/2017   CL 101 04/18/2017   CREATININE 0.79 04/18/2017   BUN 19 04/18/2017   CO2 29 04/18/2017   TSH 2.68 01/11/2017   INR 1.0 03/01/2017   HGBA1C 5.5 09/04/2012   MICROALBUR 0.2 01/16/2014    Lab Results  Component Value Date   TSH 2.68 01/11/2017   Lab Results  Component Value Date   WBC 6.7 03/01/2017   HGB 12.3 03/01/2017   HCT 37.6 03/01/2017   MCV 95 03/01/2017   PLT 298 03/01/2017   Lab Results  Component Value Date   NA 141 04/18/2017   K 3.9 04/18/2017   CO2 29 04/18/2017   GLUCOSE 92 04/18/2017   BUN 19 04/18/2017   CREATININE 0.79 04/18/2017   BILITOT 0.3 03/01/2017   ALKPHOS 92 03/01/2017   AST 17 03/01/2017   ALT 6 03/01/2017   PROT 7.1 03/01/2017   ALBUMIN 4.5 03/01/2017   CALCIUM 8.9 04/18/2017   GFR  69.64 01/11/2017   Lab Results  Component Value Date   CHOL 182 01/11/2017   Lab Results  Component Value Date   HDL 58.70 01/11/2017   Lab Results  Component Value Date   LDLCALC 107 (H) 01/11/2017   Lab Results  Component Value Date   TRIG 82.0 01/11/2017   Lab Results  Component Value Date   CHOLHDL 3 01/11/2017   Lab Results  Component Value Date   HGBA1C 5.5 09/04/2012         Assessment & Plan:   Problem List Items Addressed This Visit    None    Visit Diagnoses    Dysuria    -  Primary   Relevant Medications   sulfamethoxazole-trimethoprim (BACTRIM DS,SEPTRA DS) 800-160 MG tablet   Other Relevant Orders   POCT Urinalysis Dipstick (Automated) (Completed)   Urine Culture   Urinary tract infection with hematuria, site unspecified       Relevant Medications   sulfamethoxazole-trimethoprim (BACTRIM DS,SEPTRA DS) 800-160 MG tablet      I am having Elizabeth Mccullough start on sulfamethoxazole-trimethoprim. I am also having her maintain her multivitamin, carbidopa-levodopa, ALPRAZolam, lovastatin, aspirin EC, furosemide, potassium chloride, losartan, venlafaxine XR, carvedilol, and carbidopa-levodopa.  Meds ordered this encounter  Medications  . sulfamethoxazole-trimethoprim (BACTRIM DS,SEPTRA DS) 800-160 MG tablet    Sig: Take 1 tablet by mouth 2 (two) times daily.    Dispense:  14 tablet    Refill:  0    CMA served as scribe during this visit. History, Physical and Plan performed by medical provider. Documentation and orders reviewed and attested to.  Ann Held, DO

## 2017-06-16 LAB — URINE CULTURE
MICRO NUMBER:: 81323735
SPECIMEN QUALITY: ADEQUATE

## 2017-06-29 ENCOUNTER — Telehealth: Payer: Self-pay | Admitting: Family Medicine

## 2017-06-29 NOTE — Telephone Encounter (Signed)
Copied from [place] [id number]. Topic: Quick Communication - See Telephone Encounter >> Jun 29, 2017  4:21 PM Burnis Medin, NT wrote: CRM for notification. See Telephone encounter for: Pt. Is calling because she was told to come back in two weeks for more labs. Didn't see any orders in to schedule. Pt would like a call back.  06/29/17.

## 2017-07-04 ENCOUNTER — Other Ambulatory Visit: Payer: Self-pay | Admitting: *Deleted

## 2017-07-04 DIAGNOSIS — N390 Urinary tract infection, site not specified: Secondary | ICD-10-CM

## 2017-07-04 NOTE — Telephone Encounter (Signed)
Patient notified.  She will call back to make appt.  Future orders placed.

## 2017-07-20 ENCOUNTER — Other Ambulatory Visit: Payer: Self-pay

## 2017-07-20 DIAGNOSIS — F411 Generalized anxiety disorder: Secondary | ICD-10-CM

## 2017-07-20 NOTE — Telephone Encounter (Signed)
[name] 0.25MG  Contract:-Not on file UDS:-Not on file Last OV: 06/13/17 Next OV:-Not scheduled Last Refill: 01/11/17 #30 0rf   Please advise

## 2017-07-21 NOTE — Telephone Encounter (Signed)
Need contract and uds  Refill x1

## 2017-07-22 ENCOUNTER — Telehealth: Payer: Self-pay | Admitting: Family Medicine

## 2017-07-22 DIAGNOSIS — Z79899 Other long term (current) drug therapy: Secondary | ICD-10-CM

## 2017-07-22 DIAGNOSIS — F411 Generalized anxiety disorder: Secondary | ICD-10-CM

## 2017-07-22 NOTE — Telephone Encounter (Signed)
Copied from [place]. Topic: Quick Communication - See Telephone Encounter >> Jul 22, 2017  4:53 PM Corie Chiquito, [place] wrote: CRM for notification. See Telephone encounter for: Patients daughter called because her mother still needs a refill on her Xanax. Daughter stated that she has been in contact with the pharmacy as well. She would like for her mothers medication to be sent to the [facility]. If someone could give them a call back at [phone]  07/22/17.

## 2017-07-25 MED ORDER — ALPRAZOLAM 0.25 MG PO TABS: 0.2500 mg | ORAL_TABLET | Freq: Every evening | ORAL | 0 refills | Status: DC | PRN

## 2017-07-25 NOTE — Telephone Encounter (Signed)
Patient will be in for uds and contract

## 2017-07-25 NOTE — Telephone Encounter (Signed)
See attached for Xanax

## 2017-07-26 ENCOUNTER — Other Ambulatory Visit (INDEPENDENT_AMBULATORY_CARE_PROVIDER_SITE_OTHER): Payer: Medicare Other

## 2017-07-26 DIAGNOSIS — Z79899 Other long term (current) drug therapy: Secondary | ICD-10-CM | POA: Diagnosis not present

## 2017-07-26 DIAGNOSIS — N390 Urinary tract infection, site not specified: Secondary | ICD-10-CM

## 2017-07-26 DIAGNOSIS — F411 Generalized anxiety disorder: Secondary | ICD-10-CM | POA: Diagnosis not present

## 2017-07-26 LAB — POC URINALSYSI DIPSTICK (AUTOMATED)
BILIRUBIN UA: NEGATIVE
Blood, UA: NEGATIVE
Glucose, UA: NEGATIVE
Ketones, UA: NEGATIVE
Leukocytes, UA: NEGATIVE
NITRITE UA: NEGATIVE
PH UA: 6 (ref 5.0–8.0)
PROTEIN UA: NEGATIVE
Spec Grav, UA: 1.015 (ref 1.010–1.025)
UROBILINOGEN UA: 0.2 U/dL

## 2017-07-27 LAB — PAIN MGMT, PROFILE 8 W/CONF, U
6 Acetylmorphine: NEGATIVE ng/mL (ref <10)
AMPHETAMINES: NEGATIVE ng/mL (ref <500)
Alcohol Metabolites: NEGATIVE ng/mL (ref <500)
BUPRENORPHINE, URINE: NEGATIVE ng/mL (ref <5)
Benzodiazepines: NEGATIVE ng/mL (ref <100)
Cocaine Metabolite: NEGATIVE ng/mL (ref <150)
Creatinine: 39.7 mg/dL
MDMA: NEGATIVE ng/mL (ref <500)
Marijuana Metabolite: NEGATIVE ng/mL (ref <20)
OXIDANT: NEGATIVE ug/mL (ref <200)
OXYCODONE: NEGATIVE ng/mL (ref <100)
Opiates: NEGATIVE ng/mL (ref <100)
pH: 6.07 (ref 4.5–9.0)

## 2017-07-27 LAB — URINE CULTURE
MICRO NUMBER:: 90028627
RESULT: NO GROWTH
SPECIMEN QUALITY: ADEQUATE

## 2017-08-03 ENCOUNTER — Encounter: Payer: Self-pay | Admitting: Cardiovascular Disease

## 2017-08-03 ENCOUNTER — Ambulatory Visit: Payer: Medicare Other | Admitting: Cardiovascular Disease

## 2017-08-03 VITALS — BP 90/52 | HR 85 | Ht 61.2 in | Wt 150.5 lb

## 2017-08-03 DIAGNOSIS — I5042 Chronic combined systolic (congestive) and diastolic (congestive) heart failure: Secondary | ICD-10-CM | POA: Diagnosis not present

## 2017-08-03 DIAGNOSIS — I351 Nonrheumatic aortic (valve) insufficiency: Secondary | ICD-10-CM

## 2017-08-03 NOTE — Patient Instructions (Signed)
Medication Instructions:  Your physician recommends that you continue on your current medications as directed. Please refer to the Current Medication list given to you today.   Labwork: None Ordered   Testing/Procedures: Your physician has requested that you have an echocardiogram. Echocardiography is a painless test that uses sound waves to create images of your heart. It provides your doctor with information about the size and shape of your heart and how well your heart's chambers and valves are working. This procedure takes approximately one hour. There are no restrictions for this procedure.   Follow-Up Your physician wants you to follow-up in: 6 months with Dr. Nahser.  You will receive a reminder letter in the mail two months in advance. If you don't receive a letter, please call our office to schedule the follow-up appointment.   If you need a refill on your cardiac medications before your next appointment, please call your pharmacy.   Thank you for choosing [facility]! Michelle Swinyer, RN [phone]    

## 2017-08-03 NOTE — Progress Notes (Signed)
Cardiology Office Note:    Date:  08/03/2017   ID:  SOPHIAMARIE NEASE, DOB 1940-12-12, MRN [id number]  PCP:  Carollee Herter, Alferd Apa, DO  Cardiologist:  Mertie Moores, MD    Referring MD: Carollee Herter, Alferd Apa, *   Problem list 1. 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 Hyperlipidemia 3.  Parkinson's disease 4.  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 3  5.  Mild carotid artery disease  Chief Complaint  Patient presents with  . Congestive Heart Failure    History of Present Illness:    Elizabeth Mccullough is a 77 y.o. female with a hx of Parkinsons, hyperlipidemia . 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 Recent echo shows new EF of 20-25%.  Previous echo in 2013  showed EF 55-60%.  Increased shortness of breath for the past several months .   No PND or orthopnea. No leg swelling .    Does not eat much salt  No CP.  Not able to exercise much secondary to frequent falling and parkinson .  Walks with a cane.   Oct. 23, 2018:     Elizabeth Mccullough had a cath since her last office visit Normal coronaries. 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 She is followed by Dr. Darcey Nora.   Was started on Coreg.   Is not as short of breath as she was previoiusly  She eats a very low-salt diet.  August 03, 2017: Elizabeth Mccullough is seen today for follow-up of her chronic combined systolic and diastolic congestive heart failure.  She has had a heart catheterization and was found to have normal coronary arteries.  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  She is seen and has been followed by Dr. Prescott Gum. No CP or dyspnea. Has some fatigue,  Is tired frequently .   BP is low ( on Losartan and Sinimet)   CHF was diagnosed in Aug. 2018.   She is not feeling any better since that time    Past Medical History:  Diagnosis Date    . AAA (abdominal aortic aneurysm) ([place])   . Hyperlipidemia   . Parkinson's disease [facility])     Past Surgical History:  Procedure Laterality Date  . BREAST CYST ASPIRATION  1965   Right Breast  . HAMMER TOE SURGERY  04/10/02   Left Toe  . KNEE SURGERY Right 10/17/15   meniscus repair  . NASAL SEPTUM SURGERY  1980  . RIGHT/LEFT HEART CATH AND CORONARY ANGIOGRAPHY N/A 03/03/2017   Procedure: RIGHT/LEFT HEART CATH AND CORONARY ANGIOGRAPHY;  Surgeon: Nelva Bush, MD;  Location: [place] [facility];  Service: Cardiovascular;  Laterality: N/A;  . THORACIC AORTOGRAM N/A 03/03/2017   Procedure: Thoracic Aortogram;  Surgeon: Nelva Bush, MD;  Location: [place] [facility];  Service: Cardiovascular;  Laterality: N/A;    Current Medications: Current Meds  Medication Sig  . ALPRAZolam (XANAX) 0.25 MG tablet Take 1 tablet (0.25 mg total) by mouth at bedtime as needed for anxiety or sleep.  Marland Kitchen aspirin EC 81 MG tablet Take 1 tablet (81 mg total) by mouth daily.  . carbidopa-levodopa (SINEMET IR) 25-100 MG tablet 2 at 9am/1 at 1pm/1 at 5pm  . carvedilol (COREG) 6.25 MG tablet Take 1 tablet (6.25 mg total) by mouth 2 (two) times daily.  Marland Kitchen losartan (COZAAR) 25  MG tablet Take 1 tablet (25 mg total) by mouth daily.  Marland Kitchen lovastatin (MEVACOR) 20 MG tablet Take 20 mg by mouth daily.  . Multiple Vitamin (MULTIVITAMIN) tablet Take 1 tablet by mouth daily.  Marland Kitchen venlafaxine XR (EFFEXOR-XR) 150 MG 24 hr capsule TAKE 1 CAPSULE BY MOUTH DAILY WITH BREAKFAST.     Allergies:   Penicillins and Iohexol   Social History   Socioeconomic History  . Marital status: Widowed    Spouse name: None  . Number of children: None  . Years of education: None  . Highest education level: None  Social Needs  . Financial resource strain: None  . Food insecurity - worry: None  . Food insecurity - inability: None  . Transportation needs - medical: None  . Transportation needs - non-medical: None  Occupational History  .  Occupation: retired  Tobacco Use  . Smoking status: Never Smoker  . Smokeless tobacco: Never Used  Substance and Sexual Activity  . Alcohol use: Yes    Comment: social wine   . Drug use: No  . Sexual activity: Not Currently    Partners: Male  Other Topics Concern  . None  Social History Narrative   Exercise-- no     Family History: The patient's family history includes Breast cancer in her unknown relative; Cancer in her sister; Cancer (age of onset: 71) in her sister; Colon cancer in her unknown relative; Diabetes in her sister; Heart disease in her mother; Hyperlipidemia in her brother, sister, and sister; Hypertension in her brother; Stroke in her brother. ROS:   Please see the history of present illness.     All other systems reviewed and are negative.  EKGs/Labs/Other Studies Reviewed:    The following studies were reviewed today: recoreds from Dr. Prescott Gum  EKG:  EKG is not  ordered today.   Recent Labs: 01/11/2017: TSH 2.68 03/01/2017: ALT 6; Hemoglobin 12.3; Platelets 298 04/18/2017: BUN 19; Creatinine, Ser 0.79; Potassium 3.9; Sodium 141  Recent Lipid Panel    Component Value Date/Time   CHOL 182 01/11/2017 1228   TRIG 82.0 01/11/2017 1228   TRIG 78 07/04/2006 1313   HDL 58.70 01/11/2017 1228   CHOLHDL 3 01/11/2017 1228   VLDL 16.4 01/11/2017 1228   LDLCALC 107 (H) 01/11/2017 1228    Physical Exam:    VS:  BP (!) 90/52   Pulse 85   Ht 5' 1.2" (1.554 m)   Wt 150 lb 8 oz (68.3 kg)   LMP 07/19/1988   SpO2 96%   BMI 28.25 kg/m     Wt Readings from Last 3 Encounters:  08/03/17 150 lb 8 oz (68.3 kg)  06/13/17 151 lb 12.8 oz (68.9 kg)  05/30/17 151 lb (68.5 kg)     Physical Exam: Blood pressure (!) 90/52, pulse 85, height 5' 1.2" (1.554 m), weight 150 lb 8 oz (68.3 kg), last menstrual period 07/19/1988, SpO2 96 %.  GEN:  Well nourished, well developed in no acute distress HEENT: Normal NECK: No JVD; No carotid bruits LYMPHATICS: No  lymphadenopathy CARDIAC: RR, soft systolic murmur  RESPIRATORY:  Clear to auscultation without rales, wheezing or rhonchi  ABDOMEN: Soft, non-tender, non-distended MUSCULOSKELETAL:  No edema; No deformity  SKIN: Warm and dry NEUROLOGIC:  Alert and oriented x 3    ASSESSMENT:    No diagnosis found. PLAN:       1. Chronic combined systolic / iastolic CHF:   She is currently on low-dose carvedilol and losartan. She had  no coronary artery disease by heart cath. She is on carvedilol 6.25 mg twice a day.  She is having lots of fatigue but I would like to check an echocardiogram before we consider decreasing the dose or stopping the Coreg. If her LVEF is not any better, will consider referral to the heart failure clinic.  I'll see her in 6 months.  2. Aortic insufficiency -  Stable .   3. Mitral regurgitation - stable   4. Aortic root dilatation: She's been followed by Dr. Prescott Gum.    Medication Adjustments/Labs and Tests Ordered: Current medicines are reviewed at length with the patient today.  Concerns regarding medicines are outlined above.  No orders of the defined types were placed in this encounter.  No orders of the defined types were placed in this encounter.   Signed, Mertie Moores, MD  08/03/2017 11:21 AM    [place]

## 2017-08-04 ENCOUNTER — Other Ambulatory Visit: Payer: Self-pay | Admitting: Family Medicine

## 2017-08-04 DIAGNOSIS — F32A Depression, unspecified: Secondary | ICD-10-CM

## 2017-08-04 DIAGNOSIS — F329 Major depressive disorder, single episode, unspecified: Secondary | ICD-10-CM

## 2017-08-05 ENCOUNTER — Telehealth: Payer: Self-pay | Admitting: Cardiovascular Disease

## 2017-08-05 NOTE — Telephone Encounter (Signed)
Spoke with patient who wanted to share some additional information she forgot to mention during yesterday's (1/17) visit.  Apparently, she frequently experiences her lips and finger tips of her Right hand turning blue.  I told her that I would forward to Dr. Acie Fredrickson and the nurse.

## 2017-08-05 NOTE — Telephone Encounter (Signed)
Follow up   Elizabeth Mccullough is calling back on behalf of mother. Please call.

## 2017-08-05 NOTE — Telephone Encounter (Signed)
°  New Prob  Has some information that was omitted during last OV on 1/16. Please call.

## 2017-08-10 NOTE — Telephone Encounter (Signed)
Spoke with patient and advised I was returning her daughter's call. Patient states she thinks her daughter is exaggerating, states she does not think her lips are turning blue. I advised that per Dr. Acie Fredrickson, she may have Raynaud's phenomenon and she should be careful to cover the affected areas in cold temperatures. She verbalized understanding and states she has her echo scheduled for tomorrow. I advised that I will call her once those results are available. She thanked me for the call.

## 2017-08-11 ENCOUNTER — Telehealth: Payer: Self-pay | Admitting: Nurse Practitioner

## 2017-08-11 ENCOUNTER — Other Ambulatory Visit: Payer: Self-pay

## 2017-08-11 ENCOUNTER — Ambulatory Visit (HOSPITAL_COMMUNITY): Payer: Medicare Other | Attending: Cardiology

## 2017-08-11 DIAGNOSIS — I080 Rheumatic disorders of both mitral and aortic valves: Secondary | ICD-10-CM | POA: Diagnosis not present

## 2017-08-11 DIAGNOSIS — I5042 Chronic combined systolic (congestive) and diastolic (congestive) heart failure: Secondary | ICD-10-CM | POA: Diagnosis not present

## 2017-08-11 DIAGNOSIS — I351 Nonrheumatic aortic (valve) insufficiency: Secondary | ICD-10-CM | POA: Diagnosis not present

## 2017-08-11 NOTE — Telephone Encounter (Signed)
Reviewed echo results with patient who verbalized understanding. She asks about her low BP at last ov and asks if this would cause fatigue. I advised that hypotension could cause fatigue and advised her to monitor her BP at home for 1-2 weeks and call back with the readings. She verbalized understanding and agreement and thanked me for the call.

## 2017-08-29 NOTE — Telephone Encounter (Signed)
Called patient to find out how she has been feeling over the past few weeks. She reports she still notices periodic fatigue. She reports BP has been around 106/70 mmHg and HR 83 bpm. Patient states she wonders if her Effexor or Carbidopa/Levodopa are causing hypotension. She states she would rather reduce one of those medications than her cardiac medication. I advised her to follow-up with the prescribing doctors for those adjustments. She verbalized understanding and states she was very grateful for the call.

## 2017-09-01 ENCOUNTER — Other Ambulatory Visit: Payer: Self-pay | Admitting: Family Medicine

## 2017-11-18 ENCOUNTER — Other Ambulatory Visit: Payer: Self-pay | Admitting: Family Medicine

## 2017-11-18 DIAGNOSIS — F329 Major depressive disorder, single episode, unspecified: Secondary | ICD-10-CM

## 2017-11-18 DIAGNOSIS — F32A Depression, unspecified: Secondary | ICD-10-CM

## 2017-12-01 ENCOUNTER — Ambulatory Visit: Payer: Medicare Other | Admitting: Family Medicine

## 2017-12-01 ENCOUNTER — Encounter: Payer: Self-pay | Admitting: Family Medicine

## 2017-12-01 VITALS — BP 108/62 | HR 80 | Temp 98.3°F | Resp 16 | Ht 61.0 in | Wt 144.2 lb

## 2017-12-01 DIAGNOSIS — I712 Thoracic aortic aneurysm, without rupture, unspecified: Secondary | ICD-10-CM

## 2017-12-01 DIAGNOSIS — R82998 Other abnormal findings in urine: Secondary | ICD-10-CM

## 2017-12-01 DIAGNOSIS — R5383 Other fatigue: Secondary | ICD-10-CM | POA: Diagnosis not present

## 2017-12-01 DIAGNOSIS — G20 Parkinson's disease: Secondary | ICD-10-CM

## 2017-12-01 DIAGNOSIS — F419 Anxiety disorder, unspecified: Secondary | ICD-10-CM | POA: Diagnosis not present

## 2017-12-01 DIAGNOSIS — E785 Hyperlipidemia, unspecified: Secondary | ICD-10-CM | POA: Diagnosis not present

## 2017-12-01 DIAGNOSIS — I10 Essential (primary) hypertension: Secondary | ICD-10-CM

## 2017-12-01 DIAGNOSIS — R300 Dysuria: Secondary | ICD-10-CM | POA: Diagnosis not present

## 2017-12-01 DIAGNOSIS — I5042 Chronic combined systolic (congestive) and diastolic (congestive) heart failure: Secondary | ICD-10-CM

## 2017-12-01 LAB — CBC WITH DIFFERENTIAL/PLATELET
Basophils Absolute: 0 10*3/uL (ref 0.0–0.1)
Basophils Relative: 0.6 % (ref 0.0–3.0)
EOS PCT: 1.7 % (ref 0.0–5.0)
Eosinophils Absolute: 0.1 10*3/uL (ref 0.0–0.7)
HCT: 36.2 % (ref 36.0–46.0)
Hemoglobin: 12.2 g/dL (ref 12.0–15.0)
Lymphocytes Relative: 34.6 % (ref 12.0–46.0)
Lymphs Abs: 2 10*3/uL (ref 0.7–4.0)
MCHC: 33.8 g/dL (ref 30.0–36.0)
MCV: 94.6 fl (ref 78.0–100.0)
MONO ABS: 0.4 10*3/uL (ref 0.1–1.0)
Monocytes Relative: 7.3 % (ref 3.0–12.0)
NEUTROS ABS: 3.2 10*3/uL (ref 1.4–7.7)
Neutrophils Relative %: 55.8 % (ref 43.0–77.0)
Platelets: 297 10*3/uL (ref 150.0–400.0)
RBC: 3.83 Mil/uL — ABNORMAL LOW (ref 3.87–5.11)
RDW: 13.7 % (ref 11.5–15.5)
WBC: 5.7 10*3/uL (ref 4.0–10.5)

## 2017-12-01 LAB — COMPREHENSIVE METABOLIC PANEL
ALT: 3 U/L (ref 0–35)
AST: 13 U/L (ref 0–37)
Albumin: 4.3 g/dL (ref 3.5–5.2)
Alkaline Phosphatase: 69 U/L (ref 39–117)
BUN: 21 mg/dL (ref 6–23)
CHLORIDE: 99 meq/L (ref 96–112)
CO2: 33 mEq/L — ABNORMAL HIGH (ref 19–32)
Calcium: 9.2 mg/dL (ref 8.4–10.5)
Creatinine, Ser: 0.85 mg/dL (ref 0.40–1.20)
GFR: 68.54 mL/min (ref >60.00)
GLUCOSE: 102 mg/dL — AB (ref 70–99)
POTASSIUM: 4.1 meq/L (ref 3.5–5.1)
SODIUM: 139 meq/L (ref 135–145)
Total Bilirubin: 0.6 mg/dL (ref 0.2–1.2)
Total Protein: 6.8 g/dL (ref 6.0–8.3)

## 2017-12-01 LAB — LIPID PANEL
CHOLESTEROL: 175 mg/dL (ref 0–200)
HDL: 58.5 mg/dL (ref >39.00)
LDL CALC: 100 mg/dL — AB (ref 0–99)
NONHDL: 116.68
Total CHOL/HDL Ratio: 3
Triglycerides: 82 mg/dL (ref 0.0–149.0)
VLDL: 16.4 mg/dL (ref 0.0–40.0)

## 2017-12-01 LAB — POC URINALSYSI DIPSTICK (AUTOMATED)
Bilirubin, UA: NEGATIVE
Blood, UA: NEGATIVE
GLUCOSE UA: NEGATIVE
KETONES UA: NEGATIVE
NITRITE UA: NEGATIVE
PH UA: 7 (ref 5.0–8.0)
SPEC GRAV UA: 1.015 (ref 1.010–1.025)
Urobilinogen, UA: 0.2 E.U./dL

## 2017-12-01 LAB — TSH: TSH: 2.37 u[IU]/mL (ref 0.35–4.50)

## 2017-12-01 LAB — VITAMIN B12: VITAMIN B 12: 1213 pg/mL — AB (ref 211–911)

## 2017-12-01 NOTE — Assessment & Plan Note (Signed)
Check labs prob from mult meds

## 2017-12-01 NOTE — Assessment & Plan Note (Signed)
Per vascular  

## 2017-12-01 NOTE — Patient Instructions (Signed)

## 2017-12-01 NOTE — Assessment & Plan Note (Signed)
Stable Per cardiology 

## 2017-12-01 NOTE — Assessment & Plan Note (Signed)
Per neuro 

## 2017-12-01 NOTE — Assessment & Plan Note (Signed)
Tolerating statin, encouraged heart healthy diet, avoid trans fats, minimize simple carbs and saturated fats. Increase exercise as tolerated 

## 2017-12-01 NOTE — Progress Notes (Signed)
Patient ID: Elizabeth Mccullough, female   DOB: 02/13/1941, 77 y.o.   MRN: [id number]     Subjective:  I acted as a Education administrator for Dr. Carollee Herter.  Guerry Bruin, [place]   Patient ID: Elizabeth Mccullough, female    DOB: 14-Oct-1940, 77 y.o.   MRN: [id number]  Chief Complaint  Patient presents with  . Fatigue  . Dysuria    HPI    Patient is in today for fatigue and dysuria.  Pt has been fatigued for months.  She also c/o dysuria -- that is not constant.  Only occasionally.      Patient Care Team: Carollee Herter, Alferd Apa, DO as PCP - General Nahser, Wonda Cheng, MD as PCP - Cardiology (Cardiology) Tat, Eustace Quail, DO as Consulting Physician (Neurology) Prescott Gum, Collier Salina, MD as Consulting Physician (Cardiothoracic Surgery) Shon Hough, MD as Consulting Physician (Ophthalmology)   Past Medical History:  Diagnosis Date  . AAA (abdominal aortic aneurysm) ([place])   . Hyperlipidemia   . Parkinson's disease [facility])     Past Surgical History:  Procedure Laterality Date  . BREAST CYST ASPIRATION  1965   Right Breast  . HAMMER TOE SURGERY  04/10/02   Left Toe  . KNEE SURGERY Right 10/17/15   meniscus repair  . NASAL SEPTUM SURGERY  1980  . RIGHT/LEFT HEART CATH AND CORONARY ANGIOGRAPHY N/A 03/03/2017   Procedure: RIGHT/LEFT HEART CATH AND CORONARY ANGIOGRAPHY;  Surgeon: Nelva Bush, MD;  Location: [place] [facility];  Service: Cardiovascular;  Laterality: N/A;  . THORACIC AORTOGRAM N/A 03/03/2017   Procedure: Thoracic Aortogram;  Surgeon: Nelva Bush, MD;  Location: [place] [facility];  Service: Cardiovascular;  Laterality: N/A;    Family History  Problem Relation Age of Onset  . Heart disease Mother   . Hyperlipidemia Sister   . Hyperlipidemia Brother   . Diabetes Sister   . Hyperlipidemia Sister   . Cancer Sister 88       colon  . Cancer Sister        breast  . Stroke Brother   . Hypertension Brother   . Breast cancer Unknown   . Colon cancer Unknown     Social History    Socioeconomic History  . Marital status: Widowed    Spouse name: Not on file  . Number of children: Not on file  . Years of education: Not on file  . Highest education level: Not on file  Occupational History  . Occupation: retired  Scientific laboratory technician  . Financial resource strain: Not on file  . Food insecurity:    Worry: Not on file    Inability: Not on file  . Transportation needs:    Medical: Not on file    Non-medical: Not on file  Tobacco Use  . Smoking status: Never Smoker  . Smokeless tobacco: Never Used  Substance and Sexual Activity  . Alcohol use: Yes    Comment: social wine   . Drug use: No  . Sexual activity: Not Currently    Partners: Male  Lifestyle  . Physical activity:    Days per week: Not on file    Minutes per session: Not on file  . Stress: Not on file  Relationships  . Social connections:    Talks on phone: Not on file    Gets together: Not on file    Attends religious service: Not on file    Active member of club or organization: Not on file    Attends meetings  of clubs or organizations: Not on file    Relationship status: Not on file  . Intimate partner violence:    Fear of current or ex partner: Not on file    Emotionally abused: Not on file    Physically abused: Not on file    Forced sexual activity: Not on file  Other Topics Concern  . Not on file  Social History Narrative   Exercise-- no    Outpatient Medications Prior to Visit  Medication Sig Dispense Refill  . ALPRAZolam (XANAX) 0.25 MG tablet Take 1 tablet (0.25 mg total) by mouth at bedtime as needed for anxiety or sleep. 30 tablet 0  . aspirin EC 81 MG tablet Take 1 tablet (81 mg total) by mouth daily.    . carbidopa-levodopa (SINEMET IR) 25-100 MG tablet 2 at 9am/1 at 1pm/1 at 5pm 360 tablet 1  . carvedilol (COREG) 6.25 MG tablet Take 1 tablet (6.25 mg total) by mouth 2 (two) times daily. 180 tablet 3  . furosemide (LASIX) 20 MG tablet Take 1 tablet (20 mg total) by mouth daily. 90  tablet 3  . losartan (COZAAR) 25 MG tablet Take 1 tablet (25 mg total) by mouth daily. 30 tablet 11  . lovastatin (MEVACOR) 20 MG tablet Take 20 mg by mouth daily.    Marland Kitchen lovastatin (MEVACOR) 20 MG tablet TAKE TWO TABLETS BY MOUTH AT BEDTIME 180 tablet 1  . Multiple Vitamin (MULTIVITAMIN) tablet Take 1 tablet by mouth daily.    Marland Kitchen venlafaxine XR (EFFEXOR-XR) 150 MG 24 hr capsule TAKE 1 CAPSULE BY MOUTH DAILY WITH BREAKFAST. 90 capsule 1  . potassium chloride (K-DUR) 10 MEQ tablet Take 1 tablet (10 mEq total) by mouth daily. 90 tablet 3   No facility-administered medications prior to visit.     Allergies  Allergen Reactions  . Penicillins Hives  . Iohexol Rash     Code: RASH, Desc: PATIENT STATES SHE IS ALLERGIC TO IV DYE. 20 YRS AGO SHE HAD A REACTION AT [facility], PT WAS GIVEN BENADRYL. 07/13/06/RM, Onset Date: [id number]     Review of Systems  Constitutional: Positive for malaise/fatigue. Negative for fever.  HENT: Negative for congestion.   Eyes: Negative for blurred vision.  Respiratory: Negative for cough and shortness of breath.   Cardiovascular: Negative for chest pain, palpitations and leg swelling.  Gastrointestinal: Negative for vomiting.  Genitourinary: Positive for dysuria.  Musculoskeletal: Negative for back pain.  Skin: Negative for rash.  Neurological: Negative for loss of consciousness and headaches.  Psychiatric/Behavioral: Positive for memory loss. The patient is nervous/anxious and has insomnia.        Objective:    Physical Exam  Constitutional: She is oriented to person, place, and time. She appears well-developed and well-nourished.  HENT:  Head: Normocephalic and atraumatic.  Eyes: Conjunctivae and EOM are normal.  Neck: Normal range of motion. Neck supple. No JVD present. Carotid bruit is not present. No thyromegaly present.  Cardiovascular: Normal rate, regular rhythm and normal heart sounds.  No murmur heard. Pulmonary/Chest: Effort normal and  breath sounds normal. No respiratory distress. She has no wheezes. She has no rales. She exhibits no tenderness.  Musculoskeletal: She exhibits no edema.  Neurological: She is alert and oriented to person, place, and time.  Psychiatric: Her behavior is normal. Thought content normal. Her mood appears anxious.  Nursing note and vitals reviewed.   BP 108/62 (BP Location: Right Arm, Cuff Size: Normal)   Pulse 80   Temp 98.3 F (36.8  C) (Oral)   Resp 16   Ht 5\' 1"  (1.549 m)   Wt 144 lb 3.2 oz (65.4 kg)   LMP 07/19/1988   SpO2 97%   BMI 27.25 kg/m  Wt Readings from Last 3 Encounters:  12/01/17 144 lb 3.2 oz (65.4 kg)  08/03/17 150 lb 8 oz (68.3 kg)  06/13/17 151 lb 12.8 oz (68.9 kg)   BP Readings from Last 3 Encounters:  12/01/17 108/62  08/03/17 (!) 90/52  06/13/17 110/70     Immunization History  Administered Date(s) Administered  . Influenza Split 05/11/2011, 05/10/2012  . Influenza Whole 05/16/2007, 04/16/2008, 05/19/2009, 04/29/2010  . Influenza, High Dose Seasonal PF 05/24/2013, 05/15/2015, 05/13/2016  . Influenza,inj,Quad PF,6+ Mos 04/29/2014  . Influenza-Unspecified 05/16/2017  . Pneumococcal Conjugate-13 01/16/2014  . Pneumococcal Polysaccharide-23 06/03/2004  . Td 07/05/2006  . Tdap 04/10/2013    Health Maintenance  Topic Date Due  . MAMMOGRAM  01/05/2018  . INFLUENZA VACCINE  02/16/2018  . TETANUS/TDAP  04/11/2023  . DEXA SCAN  Completed  . PNA vac Low Risk Adult  Completed    Lab Results  Component Value Date   WBC 5.7 12/01/2017   HGB 12.2 12/01/2017   HCT 36.2 12/01/2017   PLT 297.0 12/01/2017   GLUCOSE 102 (H) 12/01/2017   CHOL 175 12/01/2017   TRIG 82.0 12/01/2017   HDL 58.50 12/01/2017   LDLCALC 100 (H) 12/01/2017   ALT 3 12/01/2017   AST 13 12/01/2017   NA 139 12/01/2017   K 4.1 12/01/2017   CL 99 12/01/2017   CREATININE 0.85 12/01/2017   BUN 21 12/01/2017   CO2 33 (H) 12/01/2017   TSH 2.37 12/01/2017   INR 1.0 03/01/2017   HGBA1C  5.5 09/04/2012   MICROALBUR 0.2 01/16/2014    Lab Results  Component Value Date   TSH 2.37 12/01/2017   Lab Results  Component Value Date   WBC 5.7 12/01/2017   HGB 12.2 12/01/2017   HCT 36.2 12/01/2017   MCV 94.6 12/01/2017   PLT 297.0 12/01/2017   Lab Results  Component Value Date   NA 139 12/01/2017   K 4.1 12/01/2017   CO2 33 (H) 12/01/2017   GLUCOSE 102 (H) 12/01/2017   BUN 21 12/01/2017   CREATININE 0.85 12/01/2017   BILITOT 0.6 12/01/2017   ALKPHOS 69 12/01/2017   AST 13 12/01/2017   ALT 3 12/01/2017   PROT 6.8 12/01/2017   ALBUMIN 4.3 12/01/2017   CALCIUM 9.2 12/01/2017   GFR 68.54 12/01/2017   Lab Results  Component Value Date   CHOL 175 12/01/2017   Lab Results  Component Value Date   HDL 58.50 12/01/2017   Lab Results  Component Value Date   LDLCALC 100 (H) 12/01/2017   Lab Results  Component Value Date   TRIG 82.0 12/01/2017   Lab Results  Component Value Date   CHOLHDL 3 12/01/2017   Lab Results  Component Value Date   HGBA1C 5.5 09/04/2012         Assessment & Plan:   Problem List Items Addressed This Visit      Unprioritized   Chronic combined systolic and diastolic CHF (congestive heart failure) ([place])    Stable Per cardiology      Fatigue    Check labs prob from mult meds       Relevant Orders   TSH (Completed)   Vitamin B12 (Completed)   CBC with Differential/Platelet (Completed)   Comprehensive metabolic panel (Completed)   Hyperlipidemia  Tolerating statin, encouraged heart healthy diet, avoid trans fats, minimize simple carbs and saturated fats. Increase exercise as tolerated      Parkinson disease ([place])    Per neuro      Thoracic aortic aneurysm without rupture (HCC) (Chronic)    Per vascular       Other Visit Diagnoses    Dysuria    -  Primary   Relevant Orders   POCT Urinalysis Dipstick (Automated) (Completed)   Anxiety       Relevant Orders   TSH (Completed)   Vitamin B12 (Completed)   CBC  with Differential/Platelet (Completed)   Comprehensive metabolic panel (Completed)   Hyperlipidemia LDL goal <100       Relevant Orders   TSH (Completed)   Vitamin B12 (Completed)   CBC with Differential/Platelet (Completed)   Comprehensive metabolic panel (Completed)   Lipid panel (Completed)   Essential hypertension       Relevant Orders   TSH (Completed)   Vitamin B12 (Completed)   CBC with Differential/Platelet (Completed)   Comprehensive metabolic panel (Completed)   Leukocytes in urine       Relevant Orders   Urine Culture      I am having Elizabeth Mccullough maintain her multivitamin, lovastatin, aspirin EC, furosemide, potassium chloride, losartan, carvedilol, carbidopa-levodopa, ALPRAZolam, lovastatin, and venlafaxine XR.  No orders of the defined types were placed in this encounter.   CMA served as Education administrator during this visit. History, Physical and Plan performed by medical provider. Documentation and orders reviewed and attested to.  Ann Held, DO

## 2017-12-02 ENCOUNTER — Other Ambulatory Visit: Payer: Self-pay | Admitting: Medical

## 2017-12-02 DIAGNOSIS — F411 Generalized anxiety disorder: Secondary | ICD-10-CM

## 2017-12-02 LAB — URINE CULTURE
MICRO NUMBER: 90597832
SPECIMEN QUALITY: ADEQUATE

## 2017-12-02 NOTE — Progress Notes (Signed)
Elizabeth Mccullough was seen today in the movement disorders clinic for neurologic consultation at the request of Ann Held, DO.  The consultation is for the evaluation of tremor.  Prior medical records were reviewed.  She is accompanied by her friend who supplements the hx.  The pt noted that she began to have tremor about a year ago.  It has increased over the year.  I noted in med records that the pt went to the ED on 04/10/13 after a fall.  Her sandal had caught on the sidewalk and she had to have stitches in her finger.  07/05/13 update:  The patient is following up today regarding her Parkinson's disease, which was just diagnosed last visit, in October 2014.  I started her on levodopa and referred her to the Parkinson's program.  The pt reports that she is taking OTC sleep medication which is helping.  She initially had nausea with the carbidopa/levodopa but is doing much better.  She really isn't sure if the nausea was from the carbidopa/levodopa 25/100 or from the antidepressant she had started but she feels good now.  She does state that she fell once since last visit.  She says that she just "wasn't looking down" and tripped walking around a car door. She insists that she is doing markedly better.  She started driving again.  "I feel almost normal."  She was supposed to go to physical therapy since our last visit, but admits that she had the initial evaluation and 2 subsequent visits, but this was when she was very nauseated and just could not tolerate the therapy.  She really is not doing any outside exercise. I did review records from other physicians since her last visit.  She had a lower extremity venous Doppler that did not demonstrate DVT but did demonstrate a rather extensive superficial thrombus that extended from above the knee to almost the ankle.  11/06/13 update:  Pt is on carbidopa/levodopa 25/100 three times per day.  She is feeling better.  No hallucinations.  No falls.   "I can walk, I can write, my mood is good."  She is tolerating the effexor well.  She is not exercising.    05/06/14 update:  Pt is f/u today re: PD. She is on carbidopa/levodopa 25/100 tid (7am/12pm/5pm).  She is on effexor for her mood.  She is feeling well.  She reports a few falls since last visit.  One was at her sons house.  She was going up the stairs and was carrying food and fell on her knees.  She had one other minor fall.  No hallucinations.  No lightheadedness or near syncope.  She is not exercising.  She notes that she has some trouble raising the R arm.   02/20/15 update:  The patient is following up today.  I have not seen her since October, 2015.  She had an appointment in April that she canceled because she reported that she was moving.  She is currently on carbidopa/levodopa 25/100, one tablet 3 times per day.   She is tolerating that well.  She remains on Effexor for anxiety and depression.  I reviewed records available to me since last visit.  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  She does not need a follow-up with them for another year and a half.  She is not exercising faithfully.  She does have some tremor of the L hand if nervous or if she is trying  to put on eyebrow pencil.  Wearing off:  No.  How long before next dose:  n/a Falls:   Yes.  , last week; fell over a pillow on the floor; did not get hurt N/V:  No. Hallucinations:  No.  visual distortions: No. Lightheaded:  No.  Syncope: No. Dyskinesia:  No.  11/14/15 update:  The patient is following up today.  I have not seen her since August, 2016.  She has a history of Parkinson's disease and is on carbidopa/levodopa 25/100, one tablet 3 times per day.  More tremor when she drives.  No lightheadedness.  No hallucinations.  She does have a history of depression and is on Effexor.  At our last visit, she reported that she was off of benzodiazepines, but states today that she is back on  Xanax. States that she really isn't taking that.  States that she only takes it if she goes on plane, etc. I did have the opportunity to review records since last visit.  She went to the emergency room on January 12 after having a fall.  She was attempting to get on the bed and fell and hit her head.  This did require stitches.  States that she had about 3 falls in total.    05/03/16 update:  The patient follows up today.  She has history of Parkinson's disease and last visit I increased her carbidopa/levodopa 25/100, so that she was taking one tablet 4 times a day instead of one tablet 3 times per day.  I asked her to take it at 7 AM/11 AM/3 PM/7 PM.  She states that about 30% of the time she takes it qid and about 70% of the time she takes it qid.  She had a fall about 2 months ago.  She was walking down her granddaughters patio and she fell on the L knee.  She is walking the track some for exercise but mostly is doing knee/leg stretches.  She denies lightheadedness or near syncope.  She denies hallucinations.  Has occasional nausea.    11/26/16 update:  Patient seen today in follow-up.  I have not seen her in about 7 months.  She is prescribed carbidopa/levodopa 25/100, one tablet 4 times per day but generally only takes it tid.  States "everything is getting harder for me to do."  She has had "lots and lots" of falls since our last visit.  States that sometimes the tip of her shoe will get caught on her rug and she will fall.  She denies lightheadedness or near syncope.  She denies hallucinations.  No significant exercise (walks to mailbox, etc).    05/30/17 update: Patient seen today in follow-up for her Parkinson's disease. She is accompanied by her daughter who supplements the history.   The patient is prescribed carbidopa/levodopa 25/100, 1 tablet at 7 AM/11 AM/3 PM/7 PM.  She still has trouble taking it and patient states that "I try to take it morning, noon, night."  Daughter states that she isn't  taking it as directed.  Daughter wonders if she has PD.  States that patients husband had PSP and her daughter wonders if she didn't take on the sx's of her husband.  Daughter asks about marijuana and if I can prescribe in a pill. She was in the hospital in August.  She had a heart cath that demonstrated normal coronary arteries.  She does have depressed LV function.  12/05/17 update:   Pt seen in f/u for PD.  She  is on carbidopa/levodopa 25/100, 2/1/1/.  She reports that she is doing well.  She has had falls, but usually from tripping over something.  No hallucinations.  Biggest c/o is dizziness.  The records that were made available to me were reviewed.  She saw cardiology in January.  She was reporting fatigue.  He thought it could be due to her carvedilol, but wanted to check an echocardiogram before decreasing it.  Echocardiogram was completed and her left ventricular ejection fraction had not changed and he did not think her persistent fatigue was related to cardiac function.  She did see her primary care physician just last week about fatigue.  It was felt likely from medication per pt.  She states that she f/u with PCP and told likely likely from meds.   "I was hoping to get xanax from my PCP but I'm not sure that she called it in."  Not driving due to dizziness.  PREVIOUS MEDICATIONS: none to date  ALLERGIES:   Allergies  Allergen Reactions  . Penicillins Hives  . Iohexol Rash     Code: RASH, Desc: PATIENT STATES SHE IS ALLERGIC TO IV DYE. 20 YRS AGO SHE HAD A REACTION AT [facility], PT WAS GIVEN BENADRYL. 07/13/06/RM, Onset Date: [id number]     CURRENT MEDICATIONS:  Current Outpatient Medications on File Prior to Visit  Medication Sig Dispense Refill  . ALPRAZolam (XANAX) 0.25 MG tablet Take 1 tablet (0.25 mg total) by mouth at bedtime as needed for anxiety or sleep. 30 tablet 0  . aspirin EC 81 MG tablet Take 1 tablet (81 mg total) by mouth daily.    . carbidopa-levodopa (SINEMET IR)  25-100 MG tablet 2 at 9am/1 at 1pm/1 at 5pm 360 tablet 1  . carvedilol (COREG) 6.25 MG tablet Take 1 tablet (6.25 mg total) by mouth 2 (two) times daily. 180 tablet 3  . losartan (COZAAR) 25 MG tablet Take 1 tablet (25 mg total) by mouth daily. 30 tablet 11  . lovastatin (MEVACOR) 20 MG tablet Take 20 mg by mouth daily.    . Multiple Vitamin (MULTIVITAMIN) tablet Take 1 tablet by mouth daily.    Marland Kitchen venlafaxine XR (EFFEXOR-XR) 150 MG 24 hr capsule TAKE 1 CAPSULE BY MOUTH DAILY WITH BREAKFAST. 90 capsule 1  . furosemide (LASIX) 20 MG tablet Take 1 tablet (20 mg total) by mouth daily. 90 tablet 3  . potassium chloride (K-DUR) 10 MEQ tablet Take 1 tablet (10 mEq total) by mouth daily. 90 tablet 3   No current facility-administered medications on file prior to visit.     PAST MEDICAL HISTORY:   Past Medical History:  Diagnosis Date  . AAA (abdominal aortic aneurysm) ([place])   . Hyperlipidemia   . Parkinson's disease ([place])     PAST SURGICAL HISTORY:   Past Surgical History:  Procedure Laterality Date  . BREAST CYST ASPIRATION  1965   Right Breast  . HAMMER TOE SURGERY  04/10/02   Left Toe  . KNEE SURGERY Right 10/17/15   meniscus repair  . NASAL SEPTUM SURGERY  1980  . RIGHT/LEFT HEART CATH AND CORONARY ANGIOGRAPHY N/A 03/03/2017   Procedure: RIGHT/LEFT HEART CATH AND CORONARY ANGIOGRAPHY;  Surgeon: Nelva Bush, MD;  Location: [facility];  Service: Cardiovascular;  Laterality: N/A;  . THORACIC AORTOGRAM N/A 03/03/2017   Procedure: Thoracic Aortogram;  Surgeon: Nelva Bush, MD;  Location: [facility];  Service: Cardiovascular;  Laterality: N/A;    SOCIAL HISTORY:   Social History  Socioeconomic History  . Marital status: Widowed    Spouse name: Not on file  . Number of children: Not on file  . Years of education: Not on file  . Highest education level: Not on file  Occupational History  . Occupation: retired  Scientific laboratory technician  . Financial resource strain: Not  on file  . Food insecurity:    Worry: Not on file    Inability: Not on file  . Transportation needs:    Medical: Not on file    Non-medical: Not on file  Tobacco Use  . Smoking status: Never Smoker  . Smokeless tobacco: Never Used  Substance and Sexual Activity  . Alcohol use: Yes    Comment: social wine   . Drug use: No  . Sexual activity: Not Currently    Partners: Male  Lifestyle  . Physical activity:    Days per week: Not on file    Minutes per session: Not on file  . Stress: Not on file  Relationships  . Social connections:    Talks on phone: Not on file    Gets together: Not on file    Attends religious service: Not on file    Active member of club or organization: Not on file    Attends meetings of clubs or organizations: Not on file    Relationship status: Not on file  . Intimate partner violence:    Fear of current or ex partner: Not on file    Emotionally abused: Not on file    Physically abused: Not on file    Forced sexual activity: Not on file  Other Topics Concern  . Not on file  Social History Narrative   Exercise-- no    FAMILY HISTORY:   Family Status  Relation Name Status  . Mother  Deceased at age 504       chf  . Father  Deceased at age 71       addisons  . Sister  Alive       36  . Brother  Deceased at age birth  . Sister  Alive       32  . Sister  Deceased  . Sister  Deceased at age 502  . Brother  Deceased  . Brother  Deceased at age 60       bleeding ulcer, heart  . Brother  Deceased at age 50       pneumonia  . Unknown  (Not Specified)  . MGM  Deceased  . MGF  Deceased  . PGM  Deceased  . PGF  Deceased    ROS:  A complete 10 system review of systems was obtained and was unremarkable apart from what is mentioned above.  PHYSICAL EXAMINATION:    VITALS:   Vitals:   12/05/17 1059  BP: 104/66  Pulse: 90  SpO2: 97%  Weight: 144 lb (65.3 kg)  Height: 5' 1.5" (1.562 m)   Wt Readings from Last 3 Encounters:  12/05/17 144 lb  (65.3 kg)  12/01/17 144 lb 3.2 oz (65.4 kg)  08/03/17 150 lb 8 oz (68.3 kg)    GEN:  The patient appears stated age and is in NAD. HEENT:  Normocephalic, atraumatic.  The mucous membranes are moist. The superficial temporal arteries are without ropiness or tenderness. CV:  RRR Lungs:  CTAB Neck/HEME:  There are no carotid bruits bilaterally.  Neurological examination:  Orientation: The patient is alert and oriented x3.  Cranial nerves: There is good  facial symmetry.  No significant facial hypomimia.  The speech is fluent and clear.  It is hypophonic.  The patient is able to make the gutteral sounds without difficulty.  Soft palate rises symmetrically and there is no tongue deviation. Hearing is intact to conversational tone. Sensation: Sensation is intact to light throughout. Motor: Strength is 5/5 in the bilateral upper and lower extremities.   Shoulder shrug is equal and symmetric.  There is no pronator drift.  Movement examination: Tone: There is normal tone today (great improvement for her) Abnormal movements: There is mild to mod RUE resting tremor Coordination:  There is decremation with any form of RAMS, including alternating supination and pronation of the forearm, hand opening and closing, finger taps, heel taps and toe taps bilaterally but it is mild Gait and Station: The patient has trouble arising without using hands.  She is slightly short stepped.  Has decreased arm swing.  Lab Results  Component Value Date   TSH 2.37 12/01/2017   Lab Results  Component Value Date   VITAMINB12 1,213 (H) 12/01/2017     Chemistry      Component Value Date/Time   NA 139 12/01/2017 1131   NA 141 04/18/2017 1216   K 4.1 12/01/2017 1131   CL 99 12/01/2017 1131   CO2 33 (H) 12/01/2017 1131   BUN 21 12/01/2017 1131   BUN 19 04/18/2017 1216   CREATININE 0.85 12/01/2017 1131   CREATININE 0.79 01/03/2015 1226      Component Value Date/Time   CALCIUM 9.2 12/01/2017 1131   ALKPHOS 69  12/01/2017 1131   AST 13 12/01/2017 1131   ALT 3 12/01/2017 1131   BILITOT 0.6 12/01/2017 1131   BILITOT 0.3 03/01/2017 1520       ASSESSMENT/PLAN:  1.  Parkinson's disease, diagnosed in 04/20/2013.  -she is having trouble remembering to take med 4 times a day and compliance has been an issue.  She is doing better with carbidopa/levodopa 25/100, 2/1/1  -talked to her about compliance with f/u visits.  Told her would not continue to RF medication beyond 6 months.  Told her that i would prefer to see her every 3-4 months but she refused.  -exericise encouraged.  rec use walker  2.  Anxiety and ? Depression  -I think that she could use a Social worker.  She refuses.  She is on effexor. 3.  Lightheadedness  -BP dropped about 70 points from lying to standing.  Likely etiology for dizziness.  She is on carvedilol and losartan but on these for CHF. Will ask PCP and cardiology if there is any adjustment we can make3 4.  F/u 6 months.  Much greater than 50% of this visit was spent in counseling and coordinating care.  Total face to face time:  25 min

## 2017-12-02 NOTE — Telephone Encounter (Signed)
Copied from [place] [id number]. Topic: Quick Communication - Rx Refill/Question >> Dec 02, 2017 10:10 AM Pricilla Handler wrote: Medication: [name] 0.25 MG tablet Has the patient contacted their pharmacy? Yes.   (Agent: If no, request that the patient contact the pharmacy for the refill.) Preferred Pharmacy (with phone number or street name): [place], [place] - [place] [phone] (Phone) [phone] (Fax)  Agent: Please be advised that RX refills may take up to 3 business days. We ask that you follow-up with your pharmacy.

## 2017-12-02 NOTE — Telephone Encounter (Signed)
Rx refill request : Alprazolam 0.25 mg        Last filled: 07/25/17    # 30  LOV: 12/02/17   PCP: [place]: verified

## 2017-12-05 ENCOUNTER — Encounter: Payer: Self-pay | Admitting: Neurology

## 2017-12-05 ENCOUNTER — Telehealth: Payer: Self-pay | Admitting: *Deleted

## 2017-12-05 ENCOUNTER — Ambulatory Visit: Payer: Medicare Other | Admitting: Neurology

## 2017-12-05 VITALS — BP 104/66 | HR 90 | Ht 61.5 in | Wt 144.0 lb

## 2017-12-05 DIAGNOSIS — G903 Multi-system degeneration of the autonomic nervous system: Secondary | ICD-10-CM | POA: Diagnosis not present

## 2017-12-05 DIAGNOSIS — G20 Parkinson's disease: Secondary | ICD-10-CM

## 2017-12-05 MED ORDER — ALPRAZOLAM 0.25 MG PO TABS: 0.2500 mg | ORAL_TABLET | Freq: Every evening | ORAL | 0 refills | Status: DC | PRN

## 2017-12-05 MED ORDER — CARBIDOPA-LEVODOPA 25-100 MG PO TABS: ORAL_TABLET | ORAL | 1 refills | Status: DC

## 2017-12-05 NOTE — Telephone Encounter (Signed)
Patient and daughter notified of results.  

## 2017-12-05 NOTE — Telephone Encounter (Signed)
Patient and daughter notified.

## 2017-12-05 NOTE — Telephone Encounter (Signed)
Copied from [place] [id number]. Topic: Inquiry >> Dec 02, 2017 10:14 AM Pricilla Handler wrote: Reason for CRM: Patient called for lab results. Please call patient.Marland KitchenMarland Kitchen

## 2017-12-05 NOTE — Telephone Encounter (Signed)
Requesting: XANAX 0.25 MG Contract: 07/26/17  UDS: 07/26/17  Low risk Last OV: 12/01/17 Next OV: - Last Refill: 07/25/17  #30   Please advise

## 2017-12-05 NOTE — Telephone Encounter (Signed)
Patient returning call.

## 2017-12-05 NOTE — Telephone Encounter (Signed)
Copied from [place] [id number] [phone]. Topic: Quick Communication - Rx Refill/Question >> Dec 02, 2017 10:10 AM Pricilla Handler wrote: Medication: [name] 0.25 MG tablet Has the patient contacted their pharmacy? Yes.   (Agent: If no, request that the patient contact the pharmacy for the refill.) Preferred Pharmacy (with phone number or street name): [place], [place] - [place] [phone] (Phone) [phone] (Fax)  Agent: Please be advised that RX refills may take up to 3 business days. We ask that you follow-up with your pharmacy.

## 2017-12-05 NOTE — Telephone Encounter (Signed)
-----   Message from Ann Held, DO sent at 12/05/2017  3:48 PM EDT ----- We need to stop the losartan because her bp was running low per neuro--- let her daughter know we discussed it with her cardiologist

## 2017-12-05 NOTE — Patient Instructions (Signed)
Registration is OPEN!    Third Annual Parkinson's Education Symposium   To register: [email or web address]      Search:  Parkinson's Symposium  Register EACH person attending individually Questions: Contact Jessica Thomas, LCSW  [phone] or [email or web address]    

## 2017-12-06 ENCOUNTER — Other Ambulatory Visit: Payer: Self-pay | Admitting: Family Medicine

## 2017-12-06 ENCOUNTER — Telehealth: Payer: Self-pay | Admitting: Neurology

## 2017-12-06 DIAGNOSIS — F411 Generalized anxiety disorder: Secondary | ICD-10-CM

## 2017-12-06 MED ORDER — ALPRAZOLAM 0.25 MG PO TABS: 0.2500 mg | ORAL_TABLET | Freq: Every evening | ORAL | 0 refills | Status: DC | PRN

## 2017-12-06 NOTE — Telephone Encounter (Signed)
done

## 2017-12-06 NOTE — Telephone Encounter (Signed)
Patient notified

## 2017-12-06 NOTE — Telephone Encounter (Signed)
Database ran and is on your desk for review.  Last filled per database: 07/26/17 Last written: 07/25/17 Last ov: 12/01/17 Next ov: none  Contract: 07/26/18 UDS: 07/26/18

## 2017-12-06 NOTE — Telephone Encounter (Signed)
Received note from Dr. Etter Sjogren and Dr. Acie Fredrickson.  Pt was already called by Dr. Etter Sjogren to go ahead and hold losartan and they will monitor her cardiac function with time.

## 2017-12-09 ENCOUNTER — Other Ambulatory Visit: Payer: Self-pay | Admitting: Cardiothoracic Surgery

## 2017-12-09 DIAGNOSIS — I712 Thoracic aortic aneurysm, without rupture, unspecified: Secondary | ICD-10-CM

## 2017-12-20 ENCOUNTER — Encounter: Payer: Self-pay | Admitting: *Deleted

## 2017-12-20 ENCOUNTER — Telehealth: Payer: Self-pay | Admitting: *Deleted

## 2017-12-20 NOTE — Telephone Encounter (Signed)
Copied from [place] [id number]. Topic: General - Other >> Dec 19, 2017  1:15 PM Carolyn Stare wrote:  Pt would like a copy on her labs done on 12/01/17 to her billing address

## 2017-12-20 NOTE — Telephone Encounter (Signed)
Lab work mailed. 

## 2018-01-03 ENCOUNTER — Telehealth: Payer: Self-pay | Admitting: Cardiovascular Disease

## 2018-01-03 NOTE — Telephone Encounter (Signed)
She did not tolerate Coreg.  Will see if her symptoms improve off the coreg. May try other meds when I see her in Sept.

## 2018-01-03 NOTE — Telephone Encounter (Signed)
New Message   Patient is calling because she has been feeling fatigue. She is not sure if its her medication. Please call to discuss.

## 2018-01-03 NOTE — Telephone Encounter (Signed)
Spoke with patient and her daughter who states she is a Marine scientist. She advised patient is very fatigued since starting Coreg and they would like her to stop the medication. Daughter states HR has been "fine" but does not have BP and HR readings to report. She states they have done a lot of research and feel that this medication is the cause of the patient's severe fatigue. States "my mom can't go on like this." I asked her to monitor patient's HR and BP and to call back to report abnormal readings. Daughter verbalized agreement and states they will bring those readings to follow-up appointment with Dr. Acie Fredrickson in September. She thanked me for the call.

## 2018-01-09 DIAGNOSIS — Z1231 Encounter for screening mammogram for malignant neoplasm of breast: Secondary | ICD-10-CM | POA: Diagnosis not present

## 2018-01-11 ENCOUNTER — Other Ambulatory Visit: Payer: Self-pay

## 2018-01-11 ENCOUNTER — Encounter: Payer: Self-pay | Admitting: Cardiothoracic Surgery

## 2018-01-11 ENCOUNTER — Ambulatory Visit: Payer: Medicare Other | Admitting: Cardiothoracic Surgery

## 2018-01-11 ENCOUNTER — Ambulatory Visit
Admission: RE | Admit: 2018-01-11 | Discharge: 2018-01-11 | Disposition: A | Discharge status: Home / Self Care | Payer: Medicare Other | Source: Ambulatory Visit | Attending: Cardiothoracic Surgery | Admitting: Cardiothoracic Surgery

## 2018-01-11 VITALS — BP 153/75 | HR 82 | Resp 18 | Ht 61.5 in | Wt 144.0 lb

## 2018-01-11 DIAGNOSIS — I7121 Aneurysm of the ascending aorta, without rupture: Secondary | ICD-10-CM

## 2018-01-11 DIAGNOSIS — I712 Thoracic aortic aneurysm, without rupture, unspecified: Secondary | ICD-10-CM

## 2018-01-11 NOTE — Progress Notes (Signed)
PCP is Carollee Herter, Alferd Apa, DO Referring Provider is Carollee Herter, Alferd Apa, *  Chief Complaint  Patient presents with  . Thoracic Aortic Aneurysm    f/u with Ct chest today    HPI: Patient presents with CT scan of chest for 1 year follow-up of fusiform ascending thoracic aortic aneurysm.  It is asymptomatic.  She has history of hypertension, Parkinson's disease, and diabetes.  Today she appears very frail looking and somewhat confused.  She states she  has fallen some at home without injury.  She is living alone and doing very short distance driving.  Currently she is having difficulty with orthostatic hypotension and dizziness and her cardiologist and internal medicine physicians are adjusting and titrating down her blood pressure medication.  A recent echocardiogram was performed showing  moderate AI, preserved LV systolic function, and mild LVH.  Today the CT scan of chest without contrast shows stable fusiform thoracic aneurysm of the ascending aorta with diameter 4.2 cm, no significant change over the past several years.  No evidence of penetrating ulcer or hematoma.  She denies chest pain.  Past Medical History:  Diagnosis Date  . AAA (abdominal aortic aneurysm) ([place])   . Hyperlipidemia   . Parkinson's disease [facility])     Past Surgical History:  Procedure Laterality Date  . BREAST CYST ASPIRATION  1965   Right Breast  . HAMMER TOE SURGERY  04/10/02   Left Toe  . KNEE SURGERY Right 10/17/15   meniscus repair  . NASAL SEPTUM SURGERY  1980  . RIGHT/LEFT HEART CATH AND CORONARY ANGIOGRAPHY N/A 03/03/2017   Procedure: RIGHT/LEFT HEART CATH AND CORONARY ANGIOGRAPHY;  Surgeon: Nelva Bush, MD;  Location: [place] [facility];  Service: Cardiovascular;  Laterality: N/A;  . THORACIC AORTOGRAM N/A 03/03/2017   Procedure: Thoracic Aortogram;  Surgeon: Nelva Bush, MD;  Location: [place] [facility];  Service: Cardiovascular;  Laterality: N/A;    Family History  Problem Relation Age  of Onset  . Heart disease Mother   . Hyperlipidemia Sister   . Hyperlipidemia Brother   . Diabetes Sister   . Hyperlipidemia Sister   . Cancer Sister 37       colon  . Cancer Sister        breast  . Stroke Brother   . Hypertension Brother   . Breast cancer Unknown   . Colon cancer Unknown     Social History Social History   Tobacco Use  . Smoking status: Never Smoker  . Smokeless tobacco: Never Used  Substance Use Topics  . Alcohol use: Yes    Comment: social wine   . Drug use: No    Current Outpatient Medications  Medication Sig Dispense Refill  . ALPRAZolam (XANAX) 0.25 MG tablet Take 1 tablet (0.25 mg total) by mouth at bedtime as needed for anxiety or sleep. 30 tablet 0  . aspirin EC 81 MG tablet Take 1 tablet (81 mg total) by mouth daily.    . carbidopa-levodopa (SINEMET IR) 25-100 MG tablet 2 at 9am/1 at 1pm/1 at 5pm 360 tablet 1  . lovastatin (MEVACOR) 20 MG tablet Take 20 mg by mouth daily.    . Melatonin 3 MG CAPS Take 3 mg by mouth at bedtime.    . Multiple Vitamin (MULTIVITAMIN) tablet Take 1 tablet by mouth daily.    Marland Kitchen venlafaxine XR (EFFEXOR-XR) 150 MG 24 hr capsule TAKE 1 CAPSULE BY MOUTH DAILY WITH BREAKFAST. 90 capsule 1  . furosemide (LASIX) 20 MG  tablet Take 1 tablet (20 mg total) by mouth daily. 90 tablet 3  . potassium chloride (K-DUR) 10 MEQ tablet Take 1 tablet (10 mEq total) by mouth daily. 90 tablet 3   No current facility-administered medications for this visit.     Allergies  Allergen Reactions  . Penicillins Hives  . Iohexol Rash     Code: RASH, Desc: PATIENT STATES SHE IS ALLERGIC TO IV DYE. 20 YRS AGO SHE HAD A REACTION AT [facility], PT WAS GIVEN BENADRYL. 07/13/06/RM, Onset Date: [id number]     Review of Systems  Weight slightly down No fever No headache No difficulty swallowing or recent dental complaints Orthostatic dizziness from sitting to standing position No ankle edema No palpitations or ectopy or atrial fib  BP (!)  153/75 (BP Location: Right Arm, Patient Position: Sitting, Cuff Size: Normal)   Pulse 82   Resp 18   Ht 5' 1.5" (1.562 m)   Wt 144 lb (65.3 kg)   LMP 07/19/1988   SpO2 97% Comment: RA  BMI 26.77 kg/m  Physical Exam Elderly frail appearing female no acute distress, mildly confused Neck without JVD carotid bruit or mass Lungs clear chest without deformity Cardiac exam with 2/6 diastolic murmur left sternal border of AI Abdomen without pulsatile mass Extremities warm without edema palpable pulses intact  Diagnostic Tests: CT scan images personally reviewed showing the ascending aorta to have minimal calcification and no significant enlargement over the interval since the previous exam 2017.  Diameter stable at 4.2 cm.  Impression: Asymptomatic mild-moderate ascending aneurysm with low risk of dissection. Aortic Size Index= 4.25    cm    /Body surface area is 1.68 meters squared. = 2.5cm/m2  < 2.75 cm/m2      4% risk per year 2.75 to 4.25          8% risk per year > 4.25 cm/m2    20% risk per year     Plan: Blood pressure today is relatively higher for her than normal and her blood pressure medications are being weaned off because of her orthostasis, dizziness.  I talked to her son and discussed the importance of keeping blood pressure under control to prevent thoracic aortic dissection and that while the blood pressure medications are being reduced she needs to keep a close watch on her blood pressure.  Return with CT scan without contrast in 1 year. Len Childs, MD [facility] [phone]

## 2018-01-25 ENCOUNTER — Ambulatory Visit (INDEPENDENT_AMBULATORY_CARE_PROVIDER_SITE_OTHER): Payer: Medicare Other | Admitting: Family Medicine

## 2018-01-25 ENCOUNTER — Encounter: Payer: Self-pay | Admitting: Family Medicine

## 2018-01-25 VITALS — BP 138/79 | HR 82 | Ht 62.0 in | Wt 141.3 lb

## 2018-01-25 DIAGNOSIS — G20 Parkinson's disease: Secondary | ICD-10-CM | POA: Diagnosis not present

## 2018-01-25 DIAGNOSIS — F411 Generalized anxiety disorder: Secondary | ICD-10-CM | POA: Diagnosis not present

## 2018-01-25 DIAGNOSIS — I714 Abdominal aortic aneurysm, without rupture, unspecified: Secondary | ICD-10-CM

## 2018-01-25 DIAGNOSIS — Z800 Family history of malignant neoplasm of digestive organs: Secondary | ICD-10-CM | POA: Diagnosis not present

## 2018-01-25 DIAGNOSIS — R739 Hyperglycemia, unspecified: Secondary | ICD-10-CM

## 2018-01-25 DIAGNOSIS — E785 Hyperlipidemia, unspecified: Secondary | ICD-10-CM

## 2018-01-25 DIAGNOSIS — I428 Other cardiomyopathies: Secondary | ICD-10-CM | POA: Diagnosis not present

## 2018-01-25 DIAGNOSIS — I5042 Chronic combined systolic (congestive) and diastolic (congestive) heart failure: Secondary | ICD-10-CM

## 2018-01-25 DIAGNOSIS — K5900 Constipation, unspecified: Secondary | ICD-10-CM

## 2018-01-25 DIAGNOSIS — Z803 Family history of malignant neoplasm of breast: Secondary | ICD-10-CM

## 2018-01-25 DIAGNOSIS — Z833 Family history of diabetes mellitus: Secondary | ICD-10-CM | POA: Diagnosis not present
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
## 2018-01-25 NOTE — Patient Instructions (Signed)
Please go off your Xanax as this is not recommended for use in older folks and can cause much more problems than benefits.  -If you feel you need something to sleep, please let me know and we can start you on something else in addition to your melatonin.  -For your constipation please use over-the-counter MiraLAX powders on a daily basis as well as try to drink adequate amounts of water- as directed by your cardiologist-since you have congestive heart failure.  Also, walking around more and being more active will also help the constipation.  -For your anxiety per your request, we will not make any changes today but in the future if need be we can switch you to something such as a citalopram which is very good for use with folks with your conditions.   Living With Anxiety After being diagnosed with an anxiety disorder, you may be relieved to know why you have felt or behaved a certain way. It is natural to also feel overwhelmed about the treatment ahead and what it will mean for your life. With care and support, you can manage this condition and recover from it. How to cope with anxiety Dealing with stress Stress is your body's reaction to life changes and events, both good and bad. Stress can last just a few hours or it can be ongoing. Stress can play a major role in anxiety, so it is important to learn both how to cope with stress and how to think about it differently. Talk with your health care provider or a counselor to learn more about stress reduction. He or she may suggest some stress reduction techniques, such as:  Music therapy. This can include creating or listening to music that you enjoy and that inspires you.  Mindfulness-based meditation. This involves being aware of your normal breaths, rather than trying to control your breathing. It can be done while sitting or walking.  Centering prayer. This is a kind of meditation that involves focusing on a word, phrase, or sacred image that  is meaningful to you and that brings you peace.  Deep breathing. To do this, expand your stomach and inhale slowly through your nose. Hold your breath for 3-5 seconds. Then exhale slowly, allowing your stomach muscles to relax.  Self-talk. This is a skill where you identify thought patterns that lead to anxiety reactions and correct those thoughts.  Muscle relaxation. This involves tensing muscles then relaxing them.  Choose a stress reduction technique that fits your lifestyle and personality. Stress reduction techniques take time and practice. Set aside 5-15 minutes a day to do them. Therapists can offer training in these techniques. The training may be covered by some insurance plans. Other things you can do to manage stress include:  Keeping a stress diary. This can help you learn what triggers your stress and ways to control your response.  Thinking about how you respond to certain situations. You may not be able to control everything, but you can control your reaction.  Making time for activities that help you relax, and not feeling guilty about spending your time in this way.  Therapy combined with coping and stress-reduction skills provides the best chance for successful treatment. Medicines Medicines can help ease symptoms. Medicines for anxiety include:  Anti-anxiety drugs.  Antidepressants.  Beta-blockers.  Medicines may be used as the main treatment for anxiety disorder, along with therapy, or if other treatments are not working. Medicines should be prescribed by a health care provider. Relationships Relationships can  play a big part in helping you recover. Try to spend more time connecting with trusted friends and family members. Consider going to couples counseling, taking family education classes, or going to family therapy. Therapy can help you and others better understand the condition. How to recognize changes in your condition Everyone has a different response to  treatment for anxiety. Recovery from anxiety happens when symptoms decrease and stop interfering with your daily activities at home or work. This may mean that you will start to:  Have better concentration and focus.  Sleep better.  Be less irritable.  Have more energy.  Have improved memory.  It is important to recognize when your condition is getting worse. Contact your health care provider if your symptoms interfere with home or work and you do not feel like your condition is improving. Where to find help and support: You can get help and support from these sources:  Self-help groups.  Online and [employer].  A trusted spiritual leader.  Couples counseling.  Family education classes.  Family therapy.  Follow these instructions at home:  Eat a healthy diet that includes plenty of vegetables, fruits, whole grains, low-fat dairy products, and lean protein. Do not eat a lot of foods that are high in solid fats, added sugars, or salt.  Exercise. Most adults should do the following: ? Exercise for at least 150 minutes each week. The exercise should increase your heart rate and make you sweat (moderate-intensity exercise). ? Strengthening exercises at least twice a week.  Cut down on caffeine, tobacco, alcohol, and other potentially harmful substances.  Get the right amount and quality of sleep. Most adults need 7-9 hours of sleep each night.  Make choices that simplify your life.  Take over-the-counter and prescription medicines only as told by your health care provider.  Avoid caffeine, alcohol, and certain over-the-counter cold medicines. These may make you feel worse. Ask your pharmacist which medicines to avoid.  Keep all follow-up visits as told by your health care provider. This is important. Questions to ask your health care provider  Would I benefit from therapy?  How often should I follow up with a health care provider?  How long do I need to  take medicine?  Are there any long-term side effects of my medicine?  Are there any alternatives to taking medicine? Contact a health care provider if:  You have a hard time staying focused or finishing daily tasks.  You spend many hours a day feeling worried about everyday life.  You become exhausted by worry.  You start to have headaches, feel tense, or have nausea.  You urinate more than normal.  You have diarrhea. Get help right away if:  You have a racing heart and shortness of breath.  You have thoughts of hurting yourself or others. If you ever feel like you may hurt yourself or others, or have thoughts about taking your own life, get help right away. You can go to your nearest emergency department or call:  Your local emergency services (911 in the [place]  A suicide crisis helpline, such as the [facility] at (734)197-3275. This is open 24-hours a day.  Summary  Taking steps to deal with stress can help calm you.  Medicines cannot cure anxiety disorders, but they can help ease symptoms.  Family, friends, and partners can play a big part in helping you recover from an anxiety disorder. This information is not intended to replace advice given to you by  your health care provider. Make sure you discuss any questions you have with your health care provider. Document Released: 06/29/2016 Document Revised: 06/29/2016 Document Reviewed: 06/29/2016 Elsevier Interactive Patient Education  2018 [employer].      Generalized Anxiety Disorder, Adult Generalized anxiety disorder (GAD) is a mental health disorder. People with this condition constantly worry about everyday events. Unlike normal anxiety, worry related to GAD is not triggered by a specific event. These worries also do not fade or get better with time. GAD interferes with life functions, including relationships, work, and school. GAD can vary from mild to severe. People with severe GAD  can have intense waves of anxiety with physical symptoms (panic attacks). What are the causes? The exact cause of GAD is not known. What increases the risk? This condition is more likely to develop in:  Women.  People who have a family history of anxiety disorders.  People who are very shy.  People who experience very stressful life events, such as the death of a loved one.  People who have a very stressful family environment.  What are the signs or symptoms? People with GAD often worry excessively about many things in their lives, such as their health and family. They may also be overly concerned about:  Doing well at work.  Being on time.  Natural disasters.  Friendships.  Physical symptoms of GAD include:  Fatigue.  Muscle tension or having muscle twitches.  Trembling or feeling shaky.  Being easily startled.  Feeling like your heart is pounding or racing.  Feeling out of breath or like you cannot take a deep breath.  Having trouble falling asleep or staying asleep.  Sweating.  Nausea, diarrhea, or irritable bowel syndrome (IBS).  Headaches.  Trouble concentrating or remembering facts.  Restlessness.  Irritability.  How is this diagnosed? Your health care provider can diagnose GAD based on your symptoms and medical history. You will also have a physical exam. The health care provider will ask specific questions about your symptoms, including how severe they are, when they started, and if they come and go. Your health care provider may ask you about your use of alcohol or drugs, including prescription medicines. Your health care provider may refer you to a mental health specialist for further evaluation. Your health care provider will do a thorough examination and may perform additional tests to rule out other possible causes of your symptoms. To be diagnosed with GAD, a person must have anxiety that:  Is out of his or her control.  Affects several  different aspects of his or her life, such as work and relationships.  Causes distress that makes him or her unable to take part in normal activities.  Includes at least three physical symptoms of GAD, such as restlessness, fatigue, trouble concentrating, irritability, muscle tension, or sleep problems.  Before your health care provider can confirm a diagnosis of GAD, these symptoms must be present more days than they are not, and they must last for six months or longer. How is this treated? The following therapies are usually used to treat GAD:  Medicine. Antidepressant medicine is usually prescribed for long-term daily control. Antianxiety medicines may be added in severe cases, especially when panic attacks occur.  Talk therapy (psychotherapy). Certain types of talk therapy can be helpful in treating GAD by providing support, education, and guidance. Options include: ? Cognitive behavioral therapy (CBT). People learn coping skills and techniques to ease their anxiety. They learn to identify unrealistic or negative thoughts  and behaviors and to replace them with positive ones. ? Acceptance and commitment therapy (ACT). This treatment teaches people how to be mindful as a way to cope with unwanted thoughts and feelings. ? Biofeedback. This process trains you to manage your body's response (physiological response) through breathing techniques and relaxation methods. You will work with a therapist while machines are used to monitor your physical symptoms.  Stress management techniques. These include yoga, meditation, and exercise.  A mental health specialist can help determine which treatment is best for you. Some people see improvement with one type of therapy. However, other people require a combination of therapies. Follow these instructions at home:  Take over-the-counter and prescription medicines only as told by your health care provider.  Try to maintain a normal routine.  Try to  anticipate stressful situations and allow extra time to manage them.  Practice any stress management or self-calming techniques as taught by your health care provider.  Do not punish yourself for setbacks or for not making progress.  Try to recognize your accomplishments, even if they are small.  Keep all follow-up visits as told by your health care provider. This is important. Contact a health care provider if:  Your symptoms do not get better.  Your symptoms get worse.  You have signs of depression, such as: ? A persistently sad, cranky, or irritable mood. ? Loss of enjoyment in activities that used to bring you joy. ? Change in weight or eating. ? Changes in sleeping habits. ? Avoiding friends or family members. ? Loss of energy for normal tasks. ? Feelings of guilt or worthlessness. Get help right away if:  You have serious thoughts about hurting yourself or others. If you ever feel like you may hurt yourself or others, or have thoughts about taking your own life, get help right away. You can go to your nearest emergency department or call:  Your local emergency services (911 in the [place]  A suicide crisis helpline, such as the [facility] at 616-455-6518. This is open 24 hours a day.  Summary  Generalized anxiety disorder (GAD) is a mental health disorder that involves worry that is not triggered by a specific event.  People with GAD often worry excessively about many things in their lives, such as their health and family.  GAD may cause physical symptoms such as restlessness, trouble concentrating, sleep problems, frequent sweating, nausea, diarrhea, headaches, and trembling or muscle twitching.  A mental health specialist can help determine which treatment is best for you. Some people see improvement with one type of therapy. However, other people require a combination of therapies. This information is not intended to replace advice given to  you by your health care provider. Make sure you discuss any questions you have with your health care provider. Document Released: 10/30/2012 Document Revised: 05/25/2016 Document Reviewed: 05/25/2016 Elsevier Interactive Patient Education  [employer].

## 2018-03-08 ENCOUNTER — Other Ambulatory Visit: Payer: Self-pay | Admitting: Cardiovascular Disease

## 2018-03-09 ENCOUNTER — Other Ambulatory Visit: Payer: Self-pay

## 2018-03-09 DIAGNOSIS — F329 Major depressive disorder, single episode, unspecified: Secondary | ICD-10-CM

## 2018-03-09 DIAGNOSIS — F32A Depression, unspecified: Secondary | ICD-10-CM

## 2018-03-09 NOTE — Telephone Encounter (Signed)
Pharmacy sent refill request for effexor medicatin was last filled by pervious provider - patient is requesting a change to lower dose of 75 mg daily.  Patient was last seen on 01/25/2018.  Please review and advised. MPulliam, CMA/RT(R)

## 2018-03-10 NOTE — Telephone Encounter (Signed)
Per Dr. Raliegh Scarlet patient needs OV - called and patient set up for 03/15/2018. MPulliam, CMA/RT(R)

## 2018-03-13 NOTE — Telephone Encounter (Signed)
Will see pt in two days and we will discuss change in dose since last OV pt did not indicate her symptoms were so well controlled she would like to lower her dose.

## 2018-03-15 ENCOUNTER — Ambulatory Visit: Payer: Medicare Other | Admitting: Family Medicine

## 2018-03-15 ENCOUNTER — Encounter: Payer: Self-pay | Admitting: Cardiovascular Disease

## 2018-03-15 VITALS — BP 119/82 | HR 81 | Ht 62.0 in | Wt 144.0 lb

## 2018-03-15 DIAGNOSIS — G20 Parkinson's disease: Secondary | ICD-10-CM | POA: Diagnosis not present

## 2018-03-15 DIAGNOSIS — F411 Generalized anxiety disorder: Secondary | ICD-10-CM | POA: Diagnosis not present

## 2018-03-15 DIAGNOSIS — M8580 Other specified disorders of bone density and structure, unspecified site: Secondary | ICD-10-CM

## 2018-03-15 DIAGNOSIS — F329 Major depressive disorder, single episode, unspecified: Secondary | ICD-10-CM

## 2018-03-15 DIAGNOSIS — M810 Age-related osteoporosis without current pathological fracture: Secondary | ICD-10-CM

## 2018-03-15 DIAGNOSIS — E785 Hyperlipidemia, unspecified: Secondary | ICD-10-CM

## 2018-03-15 DIAGNOSIS — F32A Depression, unspecified: Secondary | ICD-10-CM

## 2018-03-15 DIAGNOSIS — R296 Repeated falls: Secondary | ICD-10-CM

## 2018-03-15 DIAGNOSIS — G20A1 Parkinson's disease without dyskinesia, without mention of fluctuations: Secondary | ICD-10-CM

## 2018-03-15 DIAGNOSIS — Z780 Asymptomatic menopausal state: Secondary | ICD-10-CM

## 2018-03-15 DIAGNOSIS — G4700 Insomnia, unspecified: Secondary | ICD-10-CM

## 2018-03-15 DIAGNOSIS — E2839 Other primary ovarian failure: Secondary | ICD-10-CM

## 2018-03-15 MED ORDER — VENLAFAXINE HCL ER 150 MG PO CP24: ORAL_CAPSULE | ORAL | 1 refills | Status: DC
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
## 2018-03-15 NOTE — Patient Instructions (Signed)
Patient will get her cholesterol meds and Lasix\heart meds from cardiology in the future.  Is important you try to come off the Xanax as this is not recommended in someone your age for sleep.  This can lead to increased frequency of falls in the middle the night if you get up to pee etc.  I do not recommend it.  Increase your melatonin from 3 mg up to 9 to 10 mg nightly.

## 2018-03-18 ENCOUNTER — Other Ambulatory Visit: Payer: Self-pay | Admitting: Cardiovascular Disease

## 2018-03-27 ENCOUNTER — Ambulatory Visit: Payer: Medicare Other | Admitting: Family Medicine

## 2018-04-05 ENCOUNTER — Ambulatory Visit: Payer: Medicare Other | Admitting: Cardiovascular Disease

## 2018-04-05 ENCOUNTER — Encounter: Payer: Self-pay | Admitting: Cardiovascular Disease

## 2018-04-05 VITALS — BP 142/72 | HR 78 | Ht 62.0 in | Wt 141.8 lb

## 2018-04-05 DIAGNOSIS — I5042 Chronic combined systolic (congestive) and diastolic (congestive) heart failure: Secondary | ICD-10-CM

## 2018-04-05 DIAGNOSIS — I447 Left bundle-branch block, unspecified: Secondary | ICD-10-CM | POA: Diagnosis not present

## 2018-04-05 MED ORDER — LOSARTAN POTASSIUM 25 MG PO TABS: 25.0000 mg | ORAL_TABLET | Freq: Every day | ORAL | 3 refills | Status: DC

## 2018-04-05 MED ORDER — CARVEDILOL 3.125 MG PO TABS: 3.1250 mg | ORAL_TABLET | Freq: Two times a day (BID) | ORAL | 3 refills | Status: DC

## 2018-04-05 NOTE — Progress Notes (Signed)
Date:  04/05/2018   ID:  DALTON MILLE, DOB 1941/05/19, MRN [id number]  PCP:  Mellody Dance, DO  Cardiologist:  Mertie Moores, MD    Referring MD: Carollee Herter, Alferd Apa, *   Problem list 1. 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 Hyperlipidemia 3.  Parkinson's disease 4.  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 3  5.  Mild carotid artery disease  Chief Complaint  Patient presents with  . Congestive Heart Failure    Elizabeth Mccullough is a 77 y.o. female with a hx of Parkinsons, hyperlipidemia . 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 Recent echo shows new EF of 20-25%.  Previous echo in 2013  showed EF 55-60%.  Increased shortness of breath for the past several months .   No PND or orthopnea. No leg swelling .    Does not eat much salt  No CP.  Not able to exercise much secondary to frequent falling and parkinson .  Walks with a cane.   Oct. 23, 2018:     Elizabeth Mccullough had a cath since her last office visit Normal coronaries. 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 She is followed by Dr. Darcey Nora.   Was started on Coreg.   Is not as short of breath as she was previoiusly  She eats a very low-salt diet.  August 03, 2017: Elizabeth Mccullough is seen today for follow-up of her chronic combined systolic and diastolic congestive heart failure.  She has had a heart catheterization and was found to have normal coronary arteries.  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  She is seen and has been followed by Dr. Prescott Gum. No CP or dyspnea. Has some fatigue,  Is tired frequently .   BP is low ( on Losartan and Sinimet)   CHF was diagnosed in Aug. 2018.   She is not feeling any better since that time   Sept. 18, 2019:  Elizabeth Mccullough is seen back today for follow-up of her chronic combined systolic and  diastolic congestive heart failure.  She has normal coronary arteries by heart catheterization.   Most recent echocardiogram in January, 2019 reveals normal left ventricular systolic function with ejection fraction of 55 to 60%.  She has grade 1 diastolic dysfunction.  There is moderate aortic insufficiency.  She has moderate aortic insufficiency with mild dilatation of the aortic root and has been followed by Dr. Prescott Gum.  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  There is been no change of the past several years.  She is had some problems with orthostasis in the past.  We have titrated her medications slightly.  Has some questions for me today   She has stopped her Coreg ( daughter recommended that she stop it )  She also stopped her Losartan   Im not clear why she has stopped her Coreg and Losartan .   Past Medical History:  Diagnosis Date  . AAA (abdominal aortic aneurysm) ([place])   . Hyperlipidemia   . Parkinson's disease [facility])     Past Surgical History:  Procedure Laterality Date  . BREAST CYST ASPIRATION  1965   Right Breast  . HAMMER TOE SURGERY  04/10/02   Left Toe  . KNEE SURGERY  Right 10/17/15   meniscus repair  . NASAL SEPTUM SURGERY  1980  . RIGHT/LEFT HEART CATH AND CORONARY ANGIOGRAPHY N/A 03/03/2017   Procedure: RIGHT/LEFT HEART CATH AND CORONARY ANGIOGRAPHY;  Surgeon: Nelva Bush, MD;  Location: [place] [facility];  Service: Cardiovascular;  Laterality: N/A;  . THORACIC AORTOGRAM N/A 03/03/2017   Procedure: Thoracic Aortogram;  Surgeon: Nelva Bush, MD;  Location: [place] [facility];  Service: Cardiovascular;  Laterality: N/A;    Current Medications: Current Meds  Medication Sig  . aspirin EC 81 MG tablet Take 1 tablet (81 mg total) by mouth daily.  . carbidopa-levodopa (SINEMET IR) 25-100 MG tablet 2 at 9am/1 at 1pm/1 at 5pm  . diphenhydrAMINE (SLEEP AID, DIPHENHYDRAMINE,) 25 MG tablet  Take 25 mg by mouth at bedtime as needed for sleep.  . furosemide (LASIX) 20 MG tablet Take 20 mg by mouth daily.  Marland Kitchen lovastatin (MEVACOR) 20 MG tablet Take 20 mg by mouth daily.  . Melatonin 3 MG CAPS Take 3-10 mg by mouth at bedtime. OTC  . Multiple Vitamin (MULTIVITAMIN) tablet Take 1 tablet by mouth daily.  . potassium chloride (K-DUR) 10 MEQ tablet Take 1 tablet (10 mEq total) by mouth daily.  Marland Kitchen venlafaxine XR (EFFEXOR-XR) 150 MG 24 hr capsule TAKE 1 CAPSULE BY MOUTH DAILY WITH BREAKFAST.  . [DISCONTINUED] furosemide (LASIX) 20 MG tablet Take 1 tablet (20 mg total) by mouth daily. Please keep upcoming appt in September with Dr. Acie Fredrickson for future refills. Thank you     Allergies:   Penicillins and Iohexol   Social History   Socioeconomic History  . Marital status: Widowed    Spouse name: Not on file  . Number of children: Not on file  . Years of education: Not on file  . Highest education level: Not on file  Occupational History  . Occupation: retired  Scientific laboratory technician  . Financial resource strain: Not on file  . Food insecurity:    Worry: Not on file    Inability: Not on file  . Transportation needs:    Medical: Not on file    Non-medical: Not on file  Tobacco Use  . Smoking status: Never Smoker  . Smokeless tobacco: Never Used  Substance and Sexual Activity  . Alcohol use: Yes    Comment: social wine   . Drug use: No  . Sexual activity: Not Currently    Partners: Male  Lifestyle  . Physical activity:    Days per week: Not on file    Minutes per session: Not on file  . Stress: Not on file  Relationships  . Social connections:    Talks on phone: Not on file    Gets together: Not on file    Attends religious service: Not on file    Active member of club or organization: Not on file    Attends meetings of clubs or organizations: Not on file    Relationship status: Not on file  Other Topics Concern  . Not on file  Social History Narrative   Exercise-- no      Family History: The patient's family history includes Breast cancer in her unknown relative; Cancer in her sister; Cancer (age of onset: 22) in her sister; Colon cancer in her unknown relative; Diabetes in her sister; Heart disease in her mother; Hyperlipidemia in her brother, sister, and sister; Hypertension in her brother; Stroke in her brother. ROS:   Please see the history of present illness.     All  other systems reviewed and are negative.  EKGs/Labs/Other Studies Reviewed:    The following studies were reviewed today: recoreds from Dr. Prescott Gum  EKG:    Sept. 18, 2019:     NSR at 78.   LBBB.     Recent Labs: 12/01/2017: ALT 3; BUN 21; Creatinine, Ser 0.85; Hemoglobin 12.2; Platelets 297.0; Potassium 4.1; Sodium 139; TSH 2.37  Recent Lipid Panel    Component Value Date/Time   CHOL 175 12/01/2017 1131   TRIG 82.0 12/01/2017 1131   TRIG 78 07/04/2006 1313   HDL 58.50 12/01/2017 1131   CHOLHDL 3 12/01/2017 1131   VLDL 16.4 12/01/2017 1131   LDLCALC 100 (H) 12/01/2017 1131    Physical Exam:    VS:  BP (!) 142/72   Pulse 78   Ht 5\' 2"  (1.575 m)   Wt 141 lb 12.8 oz (64.3 kg)   LMP 07/19/1988   SpO2 99%   BMI 25.94 kg/m     Wt Readings from Last 3 Encounters:  04/05/18 141 lb 12.8 oz (64.3 kg)  03/15/18 144 lb (65.3 kg)  01/25/18 141 lb 4.8 oz (64.1 kg)     Physical Exam: Blood pressure (!) 142/72, pulse 78, height 5\' 2"  (1.575 m), weight 141 lb 12.8 oz (64.3 kg), last menstrual period 07/19/1988, SpO2 99 %.  GEN:  Well nourished, well developed in no acute distress HEENT: Normal NECK: No JVD; No carotid bruits LYMPHATICS: No lymphadenopathy CARDIAC: RRR ,  2/6 diastolic murmur  RESPIRATORY:  Clear to auscultation without rales, wheezing or rhonchi  ABDOMEN: Soft, non-tender, non-distended MUSCULOSKELETAL:  No edema; No deformity  SKIN: Warm and dry NEUROLOGIC:  Alert and oriented x 3   ASSESSMENT:    1. Chronic combined systolic and diastolic CHF  (congestive heart failure) ([place])   2. Left bundle branch block (LBBB)    PLAN:       Chronic combined systolic / diastolic CHF:   She has stopped her carvedilol and losartan.  I suspect that she was having some episodes of hypotension.  We will restart these medications at a lower dose.  I have advised her to check her blood pressure every day.  We will have her return to see an APP in 3 months.  We will check a basic metabolic profile in 3 weeks.  2. Aortic insufficiency -  Her aortic insufficiency sounds stable.  3. Mitral regurgitation - stable   4. Aortic root dilatation: She's been followed by Dr. Prescott Gum.    Medication Adjustments/Labs and Tests Ordered: Current medicines are reviewed at length with the patient today.  Concerns regarding medicines are outlined above.  Orders Placed This Encounter  Procedures  . Basic Metabolic Panel (BMET)  . EKG 12-Lead   Meds ordered this encounter  Medications  . carvedilol (COREG) 3.125 MG tablet    Sig: Take 1 tablet (3.125 mg total) by mouth 2 (two) times daily.    Dispense:  180 tablet    Refill:  3  . losartan (COZAAR) 25 MG tablet    Sig: Take 1 tablet (25 mg total) by mouth daily.    Dispense:  90 tablet    Refill:  3     Signed, Mertie Moores, MD  04/05/2018 10:52 AM    [place]

## 2018-04-05 NOTE — Patient Instructions (Addendum)
Medication Instructions:  Your physician has recommended you make the following change in your medication:   START Carvedilol (Coreg) 3.125 mg twice daily START Losartan (Cozaar) 25 mg once daily    Labwork: Your physician recommends that you return for lab work in: 3 weeks for basic metabolic panel    Testing/Procedures: None Ordered   Follow-Up: Your physician recommends that you schedule a follow-up appointment in: 3 months with Richardson Dopp, PA   If you need a refill on your cardiac medications before your next appointment, please call your pharmacy.   Thank you for choosing [facility]! Christen Bame, RN [phone]

## 2018-04-07 ENCOUNTER — Encounter: Payer: Self-pay | Admitting: *Deleted

## 2018-04-20 ENCOUNTER — Ambulatory Visit (INDEPENDENT_AMBULATORY_CARE_PROVIDER_SITE_OTHER): Payer: Medicare Other

## 2018-04-20 VITALS — BP 120/72 | HR 76 | Temp 97.8°F

## 2018-04-20 DIAGNOSIS — Z23 Encounter for immunization: Secondary | ICD-10-CM

## 2018-04-21 ENCOUNTER — Ambulatory Visit: Payer: Medicare Other

## 2018-04-27 ENCOUNTER — Emergency Department (HOSPITAL_COMMUNITY): Payer: Medicare Other

## 2018-04-27 ENCOUNTER — Encounter (HOSPITAL_COMMUNITY): Payer: Self-pay | Admitting: Emergency Medicine

## 2018-04-27 ENCOUNTER — Emergency Department (HOSPITAL_COMMUNITY)
Admission: EM | Admit: 2018-04-27 | Discharge: 2018-04-28 | Disposition: A | Discharge status: Home / Self Care | Payer: Medicare Other | Attending: Emergency Medicine | Admitting: Emergency Medicine

## 2018-04-27 DIAGNOSIS — W109XXA Fall (on) (from) unspecified stairs and steps, initial encounter: Secondary | ICD-10-CM | POA: Diagnosis not present

## 2018-04-27 DIAGNOSIS — Y999 Unspecified external cause status: Secondary | ICD-10-CM | POA: Insufficient documentation

## 2018-04-27 DIAGNOSIS — R58 Hemorrhage, not elsewhere classified: Secondary | ICD-10-CM | POA: Diagnosis not present

## 2018-04-27 DIAGNOSIS — S62652A Nondisplaced fracture of medial phalanx of right middle finger, initial encounter for closed fracture: Secondary | ICD-10-CM | POA: Diagnosis not present

## 2018-04-27 DIAGNOSIS — Z23 Encounter for immunization: Secondary | ICD-10-CM | POA: Insufficient documentation

## 2018-04-27 DIAGNOSIS — S199XXA Unspecified injury of neck, initial encounter: Secondary | ICD-10-CM | POA: Diagnosis not present

## 2018-04-27 DIAGNOSIS — T148XXA Other injury of unspecified body region, initial encounter: Secondary | ICD-10-CM

## 2018-04-27 DIAGNOSIS — S0083XA Contusion of other part of head, initial encounter: Secondary | ICD-10-CM | POA: Diagnosis not present

## 2018-04-27 DIAGNOSIS — S0081XA Abrasion of other part of head, initial encounter: Secondary | ICD-10-CM | POA: Diagnosis not present

## 2018-04-27 DIAGNOSIS — Y929 Unspecified place or not applicable: Secondary | ICD-10-CM | POA: Insufficient documentation

## 2018-04-27 DIAGNOSIS — Z79899 Other long term (current) drug therapy: Secondary | ICD-10-CM | POA: Diagnosis not present

## 2018-04-27 DIAGNOSIS — R52 Pain, unspecified: Secondary | ICD-10-CM | POA: Diagnosis not present

## 2018-04-27 DIAGNOSIS — W19XXXA Unspecified fall, initial encounter: Secondary | ICD-10-CM

## 2018-04-27 DIAGNOSIS — S0093XA Contusion of unspecified part of head, initial encounter: Secondary | ICD-10-CM | POA: Diagnosis not present

## 2018-04-27 DIAGNOSIS — S62642A Nondisplaced fracture of proximal phalanx of right middle finger, initial encounter for closed fracture: Secondary | ICD-10-CM | POA: Insufficient documentation

## 2018-04-27 DIAGNOSIS — M79644 Pain in right finger(s): Secondary | ICD-10-CM | POA: Diagnosis not present

## 2018-04-27 DIAGNOSIS — S0990XA Unspecified injury of head, initial encounter: Secondary | ICD-10-CM | POA: Diagnosis not present

## 2018-04-27 DIAGNOSIS — S6991XA Unspecified injury of right wrist, hand and finger(s), initial encounter: Secondary | ICD-10-CM | POA: Diagnosis not present

## 2018-04-27 DIAGNOSIS — I10 Essential (primary) hypertension: Secondary | ICD-10-CM | POA: Diagnosis not present

## 2018-04-27 DIAGNOSIS — Y939 Activity, unspecified: Secondary | ICD-10-CM | POA: Diagnosis not present

## 2018-04-27 DIAGNOSIS — S0031XA Abrasion of nose, initial encounter: Secondary | ICD-10-CM | POA: Diagnosis not present

## 2018-04-27 MED ORDER — LIDOCAINE HCL (PF) 1 % IJ SOLN
5.0000 mL | Freq: Once | INTRAMUSCULAR | Status: DC
  Filled 2018-04-27: qty 5

## 2018-04-27 MED ORDER — ONDANSETRON 4 MG PO TBDP
4.0000 mg | ORAL_TABLET | Freq: Once | ORAL | Status: AC
  Administered 2018-04-28: 4 mg via ORAL
  Filled 2018-04-27: qty 1

## 2018-04-27 MED ORDER — TETANUS-DIPHTH-ACELL PERTUSSIS 5-2.5-18.5 LF-MCG/0.5 IM SUSP
0.5000 mL | Freq: Once | INTRAMUSCULAR | Status: AC
  Administered 2018-04-28: 0.5 mL via INTRAMUSCULAR
  Filled 2018-04-27: qty 0.5

## 2018-04-27 MED ORDER — ACETAMINOPHEN 325 MG PO TABS
650.0000 mg | ORAL_TABLET | Freq: Once | ORAL | Status: AC
  Administered 2018-04-28: 650 mg via ORAL
  Filled 2018-04-27: qty 2

## 2018-04-27 NOTE — ED Triage Notes (Signed)
  Patient BIB [facility] after falling at her home.  Patient was going outside to feed her dogs and missed a step falling to the ground and hitting her face on the cement.  Patient has hematoma on L forehead and abrasions to forehead and nose.  No LOC.  Patient is not on blood thinners.  A&O x4.  Pain 7/10.

## 2018-04-27 NOTE — ED Notes (Signed)
Patient transported to X-ray 
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
## 2018-04-28 ENCOUNTER — Other Ambulatory Visit: Payer: Medicare Other

## 2018-04-28 NOTE — Progress Notes (Signed)
Orthopedic Tech Progress Note Patient Details:  Elizabeth Mccullough [birth year] [id number]  Ortho Devices Type of Ortho Device: Finger splint Ortho Device/Splint Location: rue middle finger Ortho Device/Splint Interventions: Ordered, Application, Adjustment   Post Interventions Patient Tolerated: Well Instructions Provided: Care of device, Adjustment of device   Karolee Stamps 04/28/2018, 1:10 AM

## 2018-04-28 NOTE — Discharge Instructions (Addendum)
Contact a health care provider if: You received a tetanus shot and you have swelling, severe pain, redness, or bleeding at the injection site. Your pain is not controlled with medicine. You have more redness, swelling, or pain around the wound. You have more fluid or blood coming from the wound. Your wound feels warm to the touch. You have pus or a bad smell coming from the wound. You have a fever or chills. You are nauseous or you vomit. You are dizzy. Get help right away if: You have a red streak going away from your wound. The edges of the wound open up and separate. Your wound is bleeding and the bleeding does not stop with gentle pressure. You have a rash. You faint. You have trouble breathing

## 2018-05-03 DIAGNOSIS — S62622A Displaced fracture of medial phalanx of right middle finger, initial encounter for closed fracture: Secondary | ICD-10-CM | POA: Diagnosis not present

## 2018-05-08 DIAGNOSIS — M79644 Pain in right finger(s): Secondary | ICD-10-CM | POA: Diagnosis not present

## 2018-05-08 DIAGNOSIS — M25641 Stiffness of right hand, not elsewhere classified: Secondary | ICD-10-CM | POA: Diagnosis not present

## 2018-05-08 DIAGNOSIS — S62622A Displaced fracture of medial phalanx of right middle finger, initial encounter for closed fracture: Secondary | ICD-10-CM | POA: Diagnosis not present

## 2018-05-10 DIAGNOSIS — S62622A Displaced fracture of medial phalanx of right middle finger, initial encounter for closed fracture: Secondary | ICD-10-CM | POA: Diagnosis not present

## 2018-06-02 NOTE — Progress Notes (Deleted)
Elizabeth Mccullough was seen today in the movement disorders clinic for neurologic consultation at the request of Mellody Dance, DO.  The consultation is for the evaluation of tremor.  Prior medical records were reviewed.  She is accompanied by her friend who supplements the hx.  The pt noted that she began to have tremor about a year ago.  It has increased over the year.  I noted in med records that the pt went to the ED on 04/10/13 after a fall.  Her sandal had caught on the sidewalk and she had to have stitches in her finger.  07/05/13 update:  The patient is following up today regarding her Parkinson's disease, which was just diagnosed last visit, in October 2014.  I started her on levodopa and referred her to the Parkinson's program.  The pt reports that she is taking OTC sleep medication which is helping.  She initially had nausea with the carbidopa/levodopa but is doing much better.  She really isn't sure if the nausea was from the carbidopa/levodopa 25/100 or from the antidepressant she had started but she feels good now.  She does state that she fell once since last visit.  She says that she just "wasn't looking down" and tripped walking around a car door. She insists that she is doing markedly better.  She started driving again.  "I feel almost normal."  She was supposed to go to physical therapy since our last visit, but admits that she had the initial evaluation and 2 subsequent visits, but this was when she was very nauseated and just could not tolerate the therapy.  She really is not doing any outside exercise. I did review records from other physicians since her last visit.  She had a lower extremity venous Doppler that did not demonstrate DVT but did demonstrate a rather extensive superficial thrombus that extended from above the knee to almost the ankle.  11/06/13 update:  Pt is on carbidopa/levodopa 25/100 three times per day.  She is feeling better.  No hallucinations.  No falls.  "I can  walk, I can write, my mood is good."  She is tolerating the effexor well.  She is not exercising.    05/06/14 update:  Pt is f/u today re: PD. She is on carbidopa/levodopa 25/100 tid (7am/12pm/5pm).  She is on effexor for her mood.  She is feeling well.  She reports a few falls since last visit.  One was at her sons house.  She was going up the stairs and was carrying food and fell on her knees.  She had one other minor fall.  No hallucinations.  No lightheadedness or near syncope.  She is not exercising.  She notes that she has some trouble raising the R arm.   02/20/15 update:  The patient is following up today.  I have not seen her since October, 2015.  She had an appointment in April that she canceled because she reported that she was moving.  She is currently on carbidopa/levodopa 25/100, one tablet 3 times per day.   She is tolerating that well.  She remains on Effexor for anxiety and depression.  I reviewed records available to me since last visit.  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  She does not need a follow-up with them for another year and a half.  She is not exercising faithfully.  She does have some tremor of the L hand if nervous or if she is trying to put  on eyebrow pencil.  Wearing off:  No.  How long before next dose:  n/a Falls:   Yes.  , last week; fell over a pillow on the floor; did not get hurt N/V:  No. Hallucinations:  No.  visual distortions: No. Lightheaded:  No.  Syncope: No. Dyskinesia:  No.  11/14/15 update:  The patient is following up today.  I have not seen her since August, 2016.  She has a history of Parkinson's disease and is on carbidopa/levodopa 25/100, one tablet 3 times per day.  More tremor when she drives.  No lightheadedness.  No hallucinations.  She does have a history of depression and is on Effexor.  At our last visit, she reported that she was off of benzodiazepines, but states today that she is back on Xanax.  States that she really isn't taking that.  States that she only takes it if she goes on plane, etc. I did have the opportunity to review records since last visit.  She went to the emergency room on January 12 after having a fall.  She was attempting to get on the bed and fell and hit her head.  This did require stitches.  States that she had about 3 falls in total.    05/03/16 update:  The patient follows up today.  She has history of Parkinson's disease and last visit I increased her carbidopa/levodopa 25/100, so that she was taking one tablet 4 times a day instead of one tablet 3 times per day.  I asked her to take it at 7 AM/11 AM/3 PM/7 PM.  She states that about 30% of the time she takes it qid and about 70% of the time she takes it qid.  She had a fall about 2 months ago.  She was walking down her granddaughters patio and she fell on the L knee.  She is walking the track some for exercise but mostly is doing knee/leg stretches.  She denies lightheadedness or near syncope.  She denies hallucinations.  Has occasional nausea.    11/26/16 update:  Patient seen today in follow-up.  I have not seen her in about 7 months.  She is prescribed carbidopa/levodopa 25/100, one tablet 4 times per day but generally only takes it tid.  States "everything is getting harder for me to do."  She has had "lots and lots" of falls since our last visit.  States that sometimes the tip of her shoe will get caught on her rug and she will fall.  She denies lightheadedness or near syncope.  She denies hallucinations.  No significant exercise (walks to mailbox, etc).    05/30/17 update: Patient seen today in follow-up for her Parkinson's disease. She is accompanied by her daughter who supplements the history.   The patient is prescribed carbidopa/levodopa 25/100, 1 tablet at 7 AM/11 AM/3 PM/7 PM.  She still has trouble taking it and patient states that "I try to take it morning, noon, night."  Daughter states that she isn't taking it  as directed.  Daughter wonders if she has PD.  States that patients husband had PSP and her daughter wonders if she didn't take on the sx's of her husband.  Daughter asks about marijuana and if I can prescribe in a pill. She was in the hospital in August.  She had a heart cath that demonstrated normal coronary arteries.  She does have depressed LV function.  12/05/17 update:   Pt seen in f/u for PD.  She is on  carbidopa/levodopa 25/100, 2/1/1/.  She reports that she is doing well.  She has had falls, but usually from tripping over something.  No hallucinations.  Biggest c/o is dizziness.  The records that were made available to me were reviewed.  She saw cardiology in January.  She was reporting fatigue.  He thought it could be due to her carvedilol, but wanted to check an echocardiogram before decreasing it.  Echocardiogram was completed and her left ventricular ejection fraction had not changed and he did not think her persistent fatigue was related to cardiac function.  She did see her primary care physician just last week about fatigue.  It was felt likely from medication per pt.  She states that she f/u with PCP and told likely likely from meds.   "I was hoping to get xanax from my PCP but I'm not sure that she called it in."  Not driving due to dizziness.  06/05/18 update:  Pt seen in f/u for PD.  Patient is accompanied by her daughter who supplements history.  Patient is on carbidopa/levodopa 25/100, 2 tablets in the morning, when the afternoon and 1 evening.  Patient is doing well.  She has had no falls.  No hallucinations.  Last visit, she was significantly orthostatic in the office.  I talked to her other physicians, and we recommended holding losartan to see if that would help.  Records are reviewed since last visit.  She called the cardiologist on June 18 because she had been feeling so tired since starting Coreg.  They told her to stop the medication.  Of note is that she did see thoracic surgery  because of her a sending aneurysm, with low risk of dissection.  They do want her blood pressure under good control but recognize that her medications were being weaned to because of orthostasis.  Patient was in the emergency room on April 27, 2018 after a fall.  She lost her footing going down the stairs and fell on her face.  There is no loss of consciousness.  CT of brain was unremarkable with the exception of a forehead hematoma.  She did have a fracture of the volar plate of the middle finger.  PREVIOUS MEDICATIONS: none to date  ALLERGIES:   Allergies  Allergen Reactions  . Penicillins Hives  . Iohexol Rash     Code: RASH, Desc: PATIENT STATES SHE IS ALLERGIC TO IV DYE. 20 YRS AGO SHE HAD A REACTION AT [facility], PT WAS GIVEN BENADRYL. 07/13/06/RM, Onset Date: [id number]     CURRENT MEDICATIONS:  Current Outpatient Medications on File Prior to Visit  Medication Sig Dispense Refill  . aspirin EC 81 MG tablet Take 1 tablet (81 mg total) by mouth daily.    . carbidopa-levodopa (SINEMET IR) 25-100 MG tablet 2 at 9am/1 at 1pm/1 at 5pm 360 tablet 1  . carvedilol (COREG) 3.125 MG tablet Take 1 tablet (3.125 mg total) by mouth 2 (two) times daily. 180 tablet 3  . diphenhydrAMINE (SLEEP AID, DIPHENHYDRAMINE,) 25 MG tablet Take 25 mg by mouth at bedtime as needed for sleep.    . furosemide (LASIX) 20 MG tablet Take 20 mg by mouth daily.    Marland Kitchen losartan (COZAAR) 25 MG tablet Take 1 tablet (25 mg total) by mouth daily. 90 tablet 3  . lovastatin (MEVACOR) 20 MG tablet Take 20 mg by mouth daily.    . Melatonin 3 MG CAPS Take 3-10 mg by mouth at bedtime. OTC    . Multiple  Vitamin (MULTIVITAMIN) tablet Take 1 tablet by mouth daily.    . potassium chloride (K-DUR) 10 MEQ tablet Take 1 tablet (10 mEq total) by mouth daily. 90 tablet 1  . venlafaxine XR (EFFEXOR-XR) 150 MG 24 hr capsule TAKE 1 CAPSULE BY MOUTH DAILY WITH BREAKFAST. 90 capsule 1   No current facility-administered medications on file  prior to visit.     PAST MEDICAL HISTORY:   Past Medical History:  Diagnosis Date  . AAA (abdominal aortic aneurysm) ([place])   . Hyperlipidemia   . Parkinson's disease ([place])     PAST SURGICAL HISTORY:   Past Surgical History:  Procedure Laterality Date  . BREAST CYST ASPIRATION  1965   Right Breast  . HAMMER TOE SURGERY  04/10/02   Left Toe  . KNEE SURGERY Right 10/17/15   meniscus repair  . NASAL SEPTUM SURGERY  1980  . RIGHT/LEFT HEART CATH AND CORONARY ANGIOGRAPHY N/A 03/03/2017   Procedure: RIGHT/LEFT HEART CATH AND CORONARY ANGIOGRAPHY;  Surgeon: Nelva Bush, MD;  Location: [facility];  Service: Cardiovascular;  Laterality: N/A;  . THORACIC AORTOGRAM N/A 03/03/2017   Procedure: Thoracic Aortogram;  Surgeon: Nelva Bush, MD;  Location: [facility];  Service: Cardiovascular;  Laterality: N/A;    SOCIAL HISTORY:   Social History   Socioeconomic History  . Marital status: Widowed    Spouse name: Not on file  . Number of children: Not on file  . Years of education: Not on file  . Highest education level: Not on file  Occupational History  . Occupation: retired  Scientific laboratory technician  . Financial resource strain: Not on file  . Food insecurity:    Worry: Not on file    Inability: Not on file  . Transportation needs:    Medical: Not on file    Non-medical: Not on file  Tobacco Use  . Smoking status: Never Smoker  . Smokeless tobacco: Never Used  Substance and Sexual Activity  . Alcohol use: Yes    Comment: social wine   . Drug use: No  . Sexual activity: Not Currently    Partners: Male  Lifestyle  . Physical activity:    Days per week: Not on file    Minutes per session: Not on file  . Stress: Not on file  Relationships  . Social connections:    Talks on phone: Not on file    Gets together: Not on file    Attends religious service: Not on file    Active member of club or organization: Not on file    Attends meetings of clubs or organizations:  Not on file    Relationship status: Not on file  . Intimate partner violence:    Fear of current or ex partner: Not on file    Emotionally abused: Not on file    Physically abused: Not on file    Forced sexual activity: Not on file  Other Topics Concern  . Not on file  Social History Narrative   Exercise-- no    FAMILY HISTORY:   Family Status  Relation Name Status  . Mother  Deceased at age 19       chf  . Father  Deceased at age 48       addisons  . Sister  Alive       30  . Brother  Deceased at age birth  . Sister  Alive       38  . Sister  Deceased  . Sister  Deceased at age 58  . Brother  Deceased  . Brother  Deceased at age 34       bleeding ulcer, heart  . Brother  Deceased at age 27       pneumonia  . Unknown  (Not Specified)  . MGM  Deceased  . MGF  Deceased  . PGM  Deceased  . PGF  Deceased    ROS:  A complete 10 system review of systems was obtained and was unremarkable apart from what is mentioned above.  PHYSICAL EXAMINATION:    VITALS:   There were no vitals filed for this visit. Wt Readings from Last 3 Encounters:  04/27/18 141 lb 12.1 oz (64.3 kg)  04/05/18 141 lb 12.8 oz (64.3 kg)  03/15/18 144 lb (65.3 kg)    GEN:  The patient appears stated age and is in NAD. HEENT:  Normocephalic, atraumatic.  The mucous membranes are moist. The superficial temporal arteries are without ropiness or tenderness. CV:  RRR Lungs:  CTAB Neck/HEME:  There are no carotid bruits bilaterally.  Neurological examination:  Orientation: The patient is alert and oriented x3.  Cranial nerves: There is good facial symmetry.  No significant facial hypomimia.  The speech is fluent and clear.  It is hypophonic.  The patient is able to make the gutteral sounds without difficulty.  Soft palate rises symmetrically and there is no tongue deviation. Hearing is intact to conversational tone. Sensation: Sensation is intact to light throughout. Motor: Strength is 5/5 in the  bilateral upper and lower extremities.   Shoulder shrug is equal and symmetric.  There is no pronator drift.  Movement examination: Tone: There is normal tone today (great improvement for her) Abnormal movements: There is mild to mod RUE resting tremor Coordination:  There is decremation with any form of RAMS, including alternating supination and pronation of the forearm, hand opening and closing, finger taps, heel taps and toe taps bilaterally but it is mild Gait and Station: The patient has trouble arising without using hands.  She is slightly short stepped.  Has decreased arm swing.  Lab Results  Component Value Date   TSH 2.37 12/01/2017   Lab Results  Component Value Date   VITAMINB12 1,213 (H) 12/01/2017     Chemistry      Component Value Date/Time   NA 139 12/01/2017 1131   NA 141 04/18/2017 1216   K 4.1 12/01/2017 1131   CL 99 12/01/2017 1131   CO2 33 (H) 12/01/2017 1131   BUN 21 12/01/2017 1131   BUN 19 04/18/2017 1216   CREATININE 0.85 12/01/2017 1131   CREATININE 0.79 01/03/2015 1226      Component Value Date/Time   CALCIUM 9.2 12/01/2017 1131   ALKPHOS 69 12/01/2017 1131   AST 13 12/01/2017 1131   ALT 3 12/01/2017 1131   BILITOT 0.6 12/01/2017 1131   BILITOT 0.3 03/01/2017 1520       ASSESSMENT/PLAN:  1.  Parkinson's disease, diagnosed in 04/20/2013.  -she is having trouble remembering to take med 4 times a day and compliance has been an issue.  She is doing better with carbidopa/levodopa 25/100, 2/1/1  -talked to her about compliance with f/u visits.  Told her would not continue to RF medication beyond 6 months.  Told her that i would prefer to see her every 3-4 months but she refused.  -exericise encouraged.  rec use walker  2.  Anxiety and ? Depression  -I think that  she could use a Social worker.  She refuses.  She is on effexor. 3.  Lightheadedness  -BP dropped about 70 points from lying to standing.  Likely etiology for dizziness.  She is on carvedilol  and losartan but on these for CHF. Will ask PCP and cardiology if there is any adjustment we can make3 4.  ***

## 2018-06-05 ENCOUNTER — Ambulatory Visit: Payer: Medicare Other | Admitting: Neurology

## 2018-06-08 DIAGNOSIS — H524 Presbyopia: Secondary | ICD-10-CM | POA: Diagnosis not present

## 2018-06-13 ENCOUNTER — Telehealth: Payer: Self-pay | Admitting: Neurology

## 2018-06-13 MED ORDER — CARBIDOPA-LEVODOPA 25-100 MG PO TABS: ORAL_TABLET | ORAL | 0 refills | Status: DC

## 2018-06-13 NOTE — Telephone Encounter (Signed)
Dr. Carles Collet had cancellation on 08/03/18 at 8:30 am. Patient made aware and moved to this date/time. Medication sent in to last til this appt. Aware she needs to keep it for refills.

## 2018-06-13 NOTE — Telephone Encounter (Signed)
Patient called and needed to schedule a follow up from where she cancelled her last one due to a fall and going to the ED. She is scheduled for April and on a Wait list. She will need a refill on her Carbidopa Levodopa medication. She needs a 90 day supply. She uses [facility]. Thanks

## 2018-06-19 NOTE — Progress Notes (Signed)
Elizabeth Mccullough was seen today in the movement disorders clinic for neurologic consultation at the request of Mellody Dance, DO.  The consultation is for the evaluation of tremor.  Prior medical records were reviewed.  She is accompanied by her friend who supplements the hx.  The pt noted that she began to have tremor about a year ago.  It has increased over the year.  I noted in med records that the pt went to the ED on 04/10/13 after a fall.  Her sandal had caught on the sidewalk and she had to have stitches in her finger.  07/05/13 update:  The patient is following up today regarding her Parkinson's disease, which was just diagnosed last visit, in October 2014.  I started her on levodopa and referred her to the Parkinson's program.  The pt reports that she is taking OTC sleep medication which is helping.  She initially had nausea with the carbidopa/levodopa but is doing much better.  She really isn't sure if the nausea was from the carbidopa/levodopa 25/100 or from the antidepressant she had started but she feels good now.  She does state that she fell once since last visit.  She says that she just "wasn't looking down" and tripped walking around a car door. She insists that she is doing markedly better.  She started driving again.  "I feel almost normal."  She was supposed to go to physical therapy since our last visit, but admits that she had the initial evaluation and 2 subsequent visits, but this was when she was very nauseated and just could not tolerate the therapy.  She really is not doing any outside exercise. I did review records from other physicians since her last visit.  She had a lower extremity venous Doppler that did not demonstrate DVT but did demonstrate a rather extensive superficial thrombus that extended from above the knee to almost the ankle.  11/06/13 update:  Pt is on carbidopa/levodopa 25/100 three times per day.  She is feeling better.  No hallucinations.  No falls.  "I can  walk, I can write, my mood is good."  She is tolerating the effexor well.  She is not exercising.    05/06/14 update:  Pt is f/u today re: PD. She is on carbidopa/levodopa 25/100 tid (7am/12pm/5pm).  She is on effexor for her mood.  She is feeling well.  She reports a few falls since last visit.  One was at her sons house.  She was going up the stairs and was carrying food and fell on her knees.  She had one other minor fall.  No hallucinations.  No lightheadedness or near syncope.  She is not exercising.  She notes that she has some trouble raising the R arm.   02/20/15 update:  The patient is following up today.  I have not seen her since October, 2015.  She had an appointment in April that she canceled because she reported that she was moving.  She is currently on carbidopa/levodopa 25/100, one tablet 3 times per day.   She is tolerating that well.  She remains on Effexor for anxiety and depression.  I reviewed records available to me since last visit.  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  She does not need a follow-up with them for another year and a half.  She is not exercising faithfully.  She does have some tremor of the L hand if nervous or if she is trying to put  on eyebrow pencil.  Wearing off:  No.  How long before next dose:  n/a Falls:   Yes.  , last week; fell over a pillow on the floor; did not get hurt N/V:  No. Hallucinations:  No.  visual distortions: No. Lightheaded:  No.  Syncope: No. Dyskinesia:  No.  11/14/15 update:  The patient is following up today.  I have not seen her since August, 2016.  She has a history of Parkinson's disease and is on carbidopa/levodopa 25/100, one tablet 3 times per day.  More tremor when she drives.  No lightheadedness.  No hallucinations.  She does have a history of depression and is on Effexor.  At our last visit, she reported that she was off of benzodiazepines, but states today that she is back on Xanax.  States that she really isn't taking that.  States that she only takes it if she goes on plane, etc. I did have the opportunity to review records since last visit.  She went to the emergency room on January 12 after having a fall.  She was attempting to get on the bed and fell and hit her head.  This did require stitches.  States that she had about 3 falls in total.    05/03/16 update:  The patient follows up today.  She has history of Parkinson's disease and last visit I increased her carbidopa/levodopa 25/100, so that she was taking one tablet 4 times a day instead of one tablet 3 times per day.  I asked her to take it at 7 AM/11 AM/3 PM/7 PM.  She states that about 30% of the time she takes it qid and about 70% of the time she takes it qid.  She had a fall about 2 months ago.  She was walking down her granddaughters patio and she fell on the L knee.  She is walking the track some for exercise but mostly is doing knee/leg stretches.  She denies lightheadedness or near syncope.  She denies hallucinations.  Has occasional nausea.    11/26/16 update:  Patient seen today in follow-up.  I have not seen her in about 7 months.  She is prescribed carbidopa/levodopa 25/100, one tablet 4 times per day but generally only takes it tid.  States "everything is getting harder for me to do."  She has had "lots and lots" of falls since our last visit.  States that sometimes the tip of her shoe will get caught on her rug and she will fall.  She denies lightheadedness or near syncope.  She denies hallucinations.  No significant exercise (walks to mailbox, etc).    05/30/17 update: Patient seen today in follow-up for her Parkinson's disease. She is accompanied by her daughter who supplements the history.   The patient is prescribed carbidopa/levodopa 25/100, 1 tablet at 7 AM/11 AM/3 PM/7 PM.  She still has trouble taking it and patient states that "I try to take it morning, noon, night."  Daughter states that she isn't taking it  as directed.  Daughter wonders if she has PD.  States that patients husband had PSP and her daughter wonders if she didn't take on the sx's of her husband.  Daughter asks about marijuana and if I can prescribe in a pill. She was in the hospital in August.  She had a heart cath that demonstrated normal coronary arteries.  She does have depressed LV function.  12/05/17 update:   Pt seen in f/u for PD.  She is on  carbidopa/levodopa 25/100, 2/1/1/.  She reports that she is doing well.  She has had falls, but usually from tripping over something.  No hallucinations.  Biggest c/o is dizziness.  The records that were made available to me were reviewed.  She saw cardiology in January.  She was reporting fatigue.  He thought it could be due to her carvedilol, but wanted to check an echocardiogram before decreasing it.  Echocardiogram was completed and her left ventricular ejection fraction had not changed and he did not think her persistent fatigue was related to cardiac function.  She did see her primary care physician just last week about fatigue.  It was felt likely from medication per pt.  She states that she f/u with PCP and told likely likely from meds.   "I was hoping to get xanax from my PCP but I'm not sure that she called it in."  Not driving due to dizziness.  06/21/18 update:  Pt seen in f/u for PD.   Patient is on carbidopa/levodopa 25/100, 2 tablets in the morning, 1 in the afternoon and 1 evening.   No hallucinations.  Last visit, she was significantly orthostatic in the office.  I talked to her other physicians, and we recommended holding losartan to see if that would help.  Records are reviewed since last visit.  She called the cardiologist on June 18 because she had been feeling so tired since starting Coreg.  They told her to stop the medication.  Of note is that she did see thoracic surgery because of her ascending aneurysm, with low risk of dissection.  They do want her blood pressure under good  control but recognize that her medications were being weaned to because of orthostasis.  Patient was in the emergency room on April 27, 2018 after a fall.  She lost her footing going down the stairs and fell on her face.  There is no loss of consciousness.  CT of brain was unremarkable with the exception of a forehead hematoma.  She did have a fracture of the volar plate of the middle finger.  She had 2 after this that she did not seek care for.  The last was 11/22.  She went face first on the cement.  States that she was walking to a car in the dark and fell.  She c/o nausea but then when asked about it she denies it and said its head pressure.  She however denies headache.  Head pressure is bilateral temporal.  States that has been going on for months.  Asks me for xanax.    PREVIOUS MEDICATIONS: none to date  ALLERGIES:   Allergies  Allergen Reactions  . Penicillins Hives  . Iohexol Rash     Code: RASH, Desc: PATIENT STATES SHE IS ALLERGIC TO IV DYE. 20 YRS AGO SHE HAD A REACTION AT [facility], PT WAS GIVEN BENADRYL. 07/13/06/RM, Onset Date: [id number]     CURRENT MEDICATIONS:  Current Outpatient Medications on File Prior to Visit  Medication Sig Dispense Refill  . aspirin EC 81 MG tablet Take 1 tablet (81 mg total) by mouth daily.    . carbidopa-levodopa (SINEMET IR) 25-100 MG tablet 2 at 9am/1 at 1pm/1 at 5pm 360 tablet 0  . diphenhydrAMINE (SLEEP AID, DIPHENHYDRAMINE,) 25 MG tablet Take 25 mg by mouth at bedtime as needed for sleep.    . furosemide (LASIX) 20 MG tablet Take 20 mg by mouth daily.    Marland Kitchen losartan (COZAAR) 25 MG tablet Take 1  tablet (25 mg total) by mouth daily. 90 tablet 3  . lovastatin (MEVACOR) 20 MG tablet Take 20 mg by mouth daily.    . Melatonin 3 MG CAPS Take 3-10 mg by mouth at bedtime. OTC    . Multiple Vitamin (MULTIVITAMIN) tablet Take 1 tablet by mouth daily.    Marland Kitchen venlafaxine XR (EFFEXOR-XR) 150 MG 24 hr capsule TAKE 1 CAPSULE BY MOUTH DAILY WITH BREAKFAST. 90  capsule 1  . carvedilol (COREG) 3.125 MG tablet Take 1 tablet (3.125 mg total) by mouth 2 (two) times daily. (Patient not taking: Reported on 06/21/2018) 180 tablet 3  . potassium chloride (K-DUR) 10 MEQ tablet Take 1 tablet (10 mEq total) by mouth daily. 90 tablet 1   No current facility-administered medications on file prior to visit.     PAST MEDICAL HISTORY:   Past Medical History:  Diagnosis Date  . AAA (abdominal aortic aneurysm) ([place])   . Hyperlipidemia   . Parkinson's disease ([place])     PAST SURGICAL HISTORY:   Past Surgical History:  Procedure Laterality Date  . BREAST CYST ASPIRATION  1965   Right Breast  . HAMMER TOE SURGERY  04/10/02   Left Toe  . KNEE SURGERY Right 10/17/15   meniscus repair  . NASAL SEPTUM SURGERY  1980  . RIGHT/LEFT HEART CATH AND CORONARY ANGIOGRAPHY N/A 03/03/2017   Procedure: RIGHT/LEFT HEART CATH AND CORONARY ANGIOGRAPHY;  Surgeon: Nelva Bush, MD;  Location: [facility];  Service: Cardiovascular;  Laterality: N/A;  . THORACIC AORTOGRAM N/A 03/03/2017   Procedure: Thoracic Aortogram;  Surgeon: Nelva Bush, MD;  Location: [facility];  Service: Cardiovascular;  Laterality: N/A;    SOCIAL HISTORY:   Social History   Socioeconomic History  . Marital status: Widowed    Spouse name: Not on file  . Number of children: Not on file  . Years of education: Not on file  . Highest education level: Not on file  Occupational History  . Occupation: retired  Scientific laboratory technician  . Financial resource strain: Not on file  . Food insecurity:    Worry: Not on file    Inability: Not on file  . Transportation needs:    Medical: Not on file    Non-medical: Not on file  Tobacco Use  . Smoking status: Never Smoker  . Smokeless tobacco: Never Used  Substance and Sexual Activity  . Alcohol use: Yes    Comment: social wine   . Drug use: No  . Sexual activity: Not Currently    Partners: Male  Lifestyle  . Physical activity:    Days per  week: Not on file    Minutes per session: Not on file  . Stress: Not on file  Relationships  . Social connections:    Talks on phone: Not on file    Gets together: Not on file    Attends religious service: Not on file    Active member of club or organization: Not on file    Attends meetings of clubs or organizations: Not on file    Relationship status: Not on file  . Intimate partner violence:    Fear of current or ex partner: Not on file    Emotionally abused: Not on file    Physically abused: Not on file    Forced sexual activity: Not on file  Other Topics Concern  . Not on file  Social History Narrative   Exercise-- no    FAMILY HISTORY:  Family Status  Relation Name Status  . Mother  Deceased at age 640       chf  . Father  Deceased at age 69       addisons  . Sister  Alive       43  . Brother  Deceased at age birth  . Sister  Alive       33  . Sister  Deceased  . Sister  Deceased at age 60  . Brother  Deceased  . Brother  Deceased at age 14       bleeding ulcer, heart  . Brother  Deceased at age 64       pneumonia  . Unknown  (Not Specified)  . MGM  Deceased  . MGF  Deceased  . PGM  Deceased  . PGF  Deceased    ROS:  Review of Systems  Constitutional: Positive for malaise/fatigue.  HENT: Negative.   Respiratory: Negative.   Cardiovascular: Negative.   Musculoskeletal: Negative.   Skin: Negative.   Psychiatric/Behavioral: The patient is nervous/anxious.      PHYSICAL EXAMINATION:    VITALS:   Vitals:   06/21/18 1011  BP: (!) 146/80  Pulse: 84  Weight: 141 lb (64 kg)  Height: 5' 1.5" (1.562 m)   Wt Readings from Last 3 Encounters:  06/21/18 141 lb (64 kg)  04/27/18 141 lb 12.1 oz (64.3 kg)  04/05/18 141 lb 12.8 oz (64.3 kg)    GEN:  The patient appears stated age and is in NAD. HEENT:  Normocephalic.  Well-healed scar over the left eyebrow is noted.  The mucous membranes are moist. The superficial temporal arteries are without ropiness or  tenderness. CV:  RRR Lungs:  CTAB Neck/HEME:  There are no carotid bruits bilaterally.  Neurological examination:  Orientation: The patient is alert and oriented x3. Cranial nerves: There is good facial symmetry. The speech is fluent and clear. Soft palate rises symmetrically and there is no tongue deviation. Hearing is intact to conversational tone. Sensation: Sensation is intact to light touch throughout Motor: Strength is 5/5 in the bilateral upper and lower extremities.   Shoulder shrug is equal and symmetric.  There is no pronator drift.   Movement examination: Tone: There is mild increased tone in the bilateral UE (she took med last at 3:30 AM) Abnormal movements: There is mild to mod RUE resting tremor Coordination:  There is decremation, with any form of RAMS, including alternating supination and pronation of the forearm, hand opening and closing, finger taps, heel taps and toe taps bilaterally. Gait and Station: The patient pushes off of the chair.  She is short stepped.  She has decreased arm swing bilaterally  Lab Results  Component Value Date   TSH 2.37 12/01/2017   Lab Results  Component Value Date   VITAMINB12 1,213 (H) 12/01/2017     Chemistry      Component Value Date/Time   NA 139 12/01/2017 1131   NA 141 04/18/2017 1216   K 4.1 12/01/2017 1131   CL 99 12/01/2017 1131   CO2 33 (H) 12/01/2017 1131   BUN 21 12/01/2017 1131   BUN 19 04/18/2017 1216   CREATININE 0.85 12/01/2017 1131   CREATININE 0.79 01/03/2015 1226      Component Value Date/Time   CALCIUM 9.2 12/01/2017 1131   ALKPHOS 69 12/01/2017 1131   AST 13 12/01/2017 1131   ALT 3 12/01/2017 1131   BILITOT 0.6 12/01/2017 1131   BILITOT 0.3 03/01/2017  1520       ASSESSMENT/PLAN:  1.  Parkinson's disease, diagnosed in 04/20/2013.  -She had trouble with compliance with 4 times per day dosing.  She is not taking it 3 times per day, but for some reason starts it in the middle of the night at 3:30  AM.  When I asked her why she does that, she states "so it is in my system."  I explained to her that she should not start taking it until she wakes up for the day.  We reviewed this several times during the visit.  I told her to take carbidopa/levodopa 25/100, 2 tablets at 7-8AM, 1 tablet at 11 AM to noon, 1 tablet at 4-5PM  -Discussed importance of compliance with follow-up visits.  -she needs PT.  She doesn't drive much and not out of her area.  She has many excuses and I am not sure she will complete it but we will send the referral.  -He is having more head pressure since recent falls.  She did hit her head.  She had a CAT scan after the first fall, but not after 2 subsequent falls.  I am going to go ahead and do that today. 2.  Anxiety and ? Depression  -I think that she could use a Social worker.  She refuses.  She is on effexor.  She doesn't think that this works.  I told her she needs to talk with prescribing physician about this.    -asks me several times during the visit for Xanax and explained that this is not good for PD patients.  Said that PCP refused as well.  Explained that she should not be on this.  Explained that this could increase risk for falls. 3.  Lightheadedness  -much improved with change of BP medication and d/c of coreg 4.  Follow-up in the next 5 to 6 months.  She refuses to follow-up more frequently than that.  Much greater than 50% of this visit was spent in counseling and coordinating care.  Total face to face time:  25 min

## 2018-06-21 ENCOUNTER — Encounter: Payer: Self-pay | Admitting: Neurology

## 2018-06-21 ENCOUNTER — Ambulatory Visit
Admission: RE | Admit: 2018-06-21 | Discharge: 2018-06-21 | Disposition: A | Discharge status: Home / Self Care | Payer: Medicare Other | Source: Ambulatory Visit | Attending: Neurology | Admitting: Neurology

## 2018-06-21 ENCOUNTER — Telehealth: Payer: Self-pay | Admitting: Neurology

## 2018-06-21 ENCOUNTER — Ambulatory Visit: Payer: Medicare Other | Admitting: Neurology

## 2018-06-21 VITALS — BP 146/80 | HR 84 | Ht 61.5 in | Wt 141.0 lb

## 2018-06-21 DIAGNOSIS — G44319 Acute post-traumatic headache, not intractable: Secondary | ICD-10-CM

## 2018-06-21 DIAGNOSIS — F411 Generalized anxiety disorder: Secondary | ICD-10-CM

## 2018-06-21 DIAGNOSIS — G20 Parkinson's disease: Secondary | ICD-10-CM

## 2018-06-21 NOTE — Patient Instructions (Signed)
1. CT today at [facility].   2. We will order physical therapy - home health. They will call you directly to start care.

## 2018-06-21 NOTE — Telephone Encounter (Signed)
-----   Message from [place], DO sent at 06/21/2018 12:23 PM EST ----- Let pt know that head CT okay

## 2018-06-21 NOTE — Telephone Encounter (Signed)
Patient made aware CT okay.

## 2018-06-23 DIAGNOSIS — G44319 Acute post-traumatic headache, not intractable: Secondary | ICD-10-CM | POA: Diagnosis not present

## 2018-06-23 DIAGNOSIS — G20 Parkinson's disease: Secondary | ICD-10-CM | POA: Diagnosis not present

## 2018-06-23 DIAGNOSIS — F411 Generalized anxiety disorder: Secondary | ICD-10-CM | POA: Diagnosis not present

## 2018-06-23 DIAGNOSIS — Z9181 History of falling: Secondary | ICD-10-CM | POA: Diagnosis not present

## 2018-06-23 DIAGNOSIS — E785 Hyperlipidemia, unspecified: Secondary | ICD-10-CM | POA: Diagnosis not present

## 2018-06-23 DIAGNOSIS — I714 Abdominal aortic aneurysm, without rupture: Secondary | ICD-10-CM | POA: Diagnosis not present

## 2018-06-26 ENCOUNTER — Ambulatory Visit: Payer: Medicare Other | Admitting: Physician Assistant

## 2018-06-26 ENCOUNTER — Encounter: Payer: Self-pay | Admitting: Physician Assistant

## 2018-06-26 VITALS — BP 140/62 | HR 86 | Ht 61.5 in | Wt 140.4 lb

## 2018-06-26 DIAGNOSIS — G20 Parkinson's disease: Secondary | ICD-10-CM

## 2018-06-26 DIAGNOSIS — I712 Thoracic aortic aneurysm, without rupture, unspecified: Secondary | ICD-10-CM

## 2018-06-26 DIAGNOSIS — I5042 Chronic combined systolic (congestive) and diastolic (congestive) heart failure: Secondary | ICD-10-CM | POA: Diagnosis not present

## 2018-06-26 DIAGNOSIS — I351 Nonrheumatic aortic (valve) insufficiency: Secondary | ICD-10-CM | POA: Diagnosis not present

## 2018-06-26 LAB — BASIC METABOLIC PANEL
BUN/Creatinine Ratio: 23 (ref 12–28)
BUN: 19 mg/dL (ref 8–27)
CALCIUM: 9.2 mg/dL (ref 8.7–10.3)
CO2: 26 mmol/L (ref 20–29)
CREATININE: 0.84 mg/dL (ref 0.57–1.00)
Chloride: 101 mmol/L (ref 96–106)
GFR calc Af Amer: 76 mL/min/{1.73_m2} (ref >59)
GFR, EST NON AFRICAN AMERICAN: 66 mL/min/{1.73_m2} (ref >59)
Glucose: 86 mg/dL (ref 65–99)
Potassium: 3.9 mmol/L (ref 3.5–5.2)
SODIUM: 144 mmol/L (ref 134–144)

## 2018-06-26 MED ORDER — CARVEDILOL 3.125 MG PO TABS: 3.1250 mg | ORAL_TABLET | Freq: Two times a day (BID) | ORAL | 3 refills | Status: DC

## 2018-06-26 NOTE — Patient Instructions (Signed)
Medication Instructions:  Your physician has recommended you make the following change in your medication:  1. RESTART COREG 3.125 TWICE DAILY   If you need a refill on your cardiac medications before your next appointment, please call your pharmacy.   Lab work: TODAY: BMET  If you have labs (blood work) drawn today and your tests are completely normal, you will receive your results only by: Marland Kitchen MyChart Message (if you have MyChart) OR . A paper copy in the mail If you have any lab test that is abnormal or we need to change your treatment, we will call you to review the results.  Testing/Procedures: NONE  Follow-Up: At [facility], you and your health needs are our priority.  As part of our continuing mission to provide you with exceptional heart care, we have created designated Provider Care Teams.  These Care Teams include your primary Cardiologist (physician) and Advanced Practice Providers (APPs -  Physician Assistants and Nurse Practitioners) who all work together to provide you with the care you need, when you need it. You will need a follow up appointment in:  6 months.  Please call our office 2 months in advance to schedule this appointment.  You may see Mertie Moores, MD or one of the following Advanced Practice Providers on your designated Care Team: Richardson Dopp, PA-C [place], [place] . Daune Perch, NP  Any Other Special Instructions Will Be Listed Below (If Applicable).

## 2018-06-26 NOTE — Progress Notes (Signed)
Cardiology Office Note:    Date:  06/26/2018   ID:  Elizabeth Mccullough, DOB 12-16-1940, MRN [id number]  PCP:  Elizabeth Dance, DO  Cardiologist:  Mertie Moores, MD  Electrophysiologist:  None  TCTS:  Dr. Prescott Gum Neurologist:  Dr. Carles Collet  Referring MD: Elizabeth Dance, DO   Chief Complaint  Patient presents with  . Follow-up    CHF    History of Present Illness:    Elizabeth Mccullough is a 77 y.o. 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 Echocardiogram in 8/18 demonstrated newly depressed LV function with an EF of 20-25%.  Cardiac catheterization demonstrated normal coronary arteries, normal pulmonary pressures, moderate aortic insufficiency and mild dilation of aortic root.  She followed by cardiothoracic surgery for management of her thoracic aortic aneurysm.  Echo in 07/2017 demonstrated improved LVF with EF 55-60.  She was last seen by Dr. Acie Fredrickson in September 2019.  The patient had stopped both her carvedilol and losartan.  She had apparently been having some issues with hypotension.  Both medications were resumed at lower doses.     Elizabeth Mccullough returns for follow-up.  She notes that she feels short of breath at rest.  She thinks it may be related to holding her breath while she is talking.  However, she can exert herself without significant shortness of breath.  She denies chest discomfort, orthopnea, paroxysmal nocturnal dyspnea or lower extremity swelling.  She denies syncope.  She does not feel that Effexor is effectively treating her anxiety.  She constantly feels fatigued.  She stopped taking carvedilol to see if this would improve her symptoms.  However, she has had no change in her symptoms since stopping carvedilol.  Prior CV studies:   The following studies were reviewed today:  Echo 08/11/17 EF 55-60, no RWMA, Gr 1 DD, mod AI, mild MR, PASP 32  Cardiac Catheterization  03/03/17 Normal coronary arteries Moderate AI, mildly dilated aortic root measuring 4 cm at the sinuses of Valsalva Mean RA 8, mean PA 18, mean PCWP 17, LVEDP 20  Echocardiogram 02/23/17 EF 20-25, diffuse HK, grade 1 diastolic dysfunction, moderate AI, moderate MR, mild TR, PASP 32  Carotid US 01/13/17  <50% right and left ICA - FU 23yrs  Past Medical History:  Diagnosis Date  . AAA (abdominal aortic aneurysm) ([place])   . Carotid artery disease ([place]) 01/12/2017   Carotid US 6/18: Bilat < 50%; repeat in 12/2000  . Chronic combined systolic and diastolic CHF (congestive heart failure) ([place]) 03/01/2017   NICM // [place] 8/18: normal coronary arteries /  Echo 8/18: EF 20-25, mod AI, mod MR, PASP 32 // Echo 1/19: EF 55-60, no RWMA, Gr 1 DD, mod AI, mild MR, PASP 32  . Hyperlipidemia   . Parkinson's disease [facility])    Surgical Hx: The patient  has a past surgical history that includes Hammer toe surgery (04/10/02); Breast cyst aspiration (1965); Nasal septum surgery (1980); Knee surgery (Right, 10/17/15); RIGHT/LEFT HEART CATH AND CORONARY ANGIOGRAPHY (N/A, 03/03/2017); and THORACIC AORTOGRAM (N/A, 03/03/2017).   Current Medications: Current Meds  Medication Sig  . aspirin EC 81 MG tablet Take 1 tablet (81 mg total) by mouth daily.  . carbidopa-levodopa (SINEMET IR) 25-100 MG tablet 2 at 9am/1 at 1pm/1 at 5pm  . diphenhydrAMINE (SLEEP AID, DIPHENHYDRAMINE,) 25 MG tablet Take 25 mg by mouth at bedtime as needed for sleep.  . furosemide (LASIX) 20 MG tablet Take 20 mg by mouth  daily.  . losartan (COZAAR) 25 MG tablet Take 1 tablet (25 mg total) by mouth daily.  Marland Kitchen lovastatin (MEVACOR) 20 MG tablet Take 20 mg by mouth daily.  . Melatonin 3 MG CAPS Take 3-10 mg by mouth at bedtime. OTC  . Multiple Vitamin (MULTIVITAMIN) tablet Take 1 tablet by mouth daily.  . potassium chloride (K-DUR) 10 MEQ tablet Take 1 tablet (10 mEq total) by mouth daily.  Marland Kitchen venlafaxine XR (EFFEXOR-XR) 150 MG 24 hr capsule TAKE 1  CAPSULE BY MOUTH DAILY WITH BREAKFAST.     Allergies:   Iohexol and Penicillins   Social History   Tobacco Use  . Smoking status: Never Smoker  . Smokeless tobacco: Never Used  Substance Use Topics  . Alcohol use: Yes    Comment: social wine   . Drug use: No     Family Hx: The patient's family history includes Breast cancer in her unknown relative; Cancer in her sister; Cancer (age of onset: 72) in her sister; Colon cancer in her unknown relative; Diabetes in her sister; Heart disease in her mother; Hyperlipidemia in her brother, sister, and sister; Hypertension in her brother; Stroke in her brother.  ROS:   Please see the history of present illness.    Review of Systems  Constitution: Positive for decreased appetite and malaise/fatigue.  Cardiovascular: Positive for dyspnea on exertion.  Gastrointestinal: Positive for constipation.  Neurological: Positive for headaches.  Psychiatric/Behavioral: The patient is nervous/anxious.    All other systems reviewed and are negative.   EKGs/Labs/Other Test Reviewed:    EKG:  EKG is  ordered today.  The ekg ordered today demonstrates normal sinus rhythm, heart rate 81, left bundle branch block  Recent Labs: 12/01/2017: ALT 3; BUN 21; Creatinine, Ser 0.85; Hemoglobin 12.2; Platelets 297.0; Potassium 4.1; Sodium 139; TSH 2.37   Recent Lipid Panel Lab Results  Component Value Date/Time   CHOL 175 12/01/2017 11:31 AM   TRIG 82.0 12/01/2017 11:31 AM   TRIG 78 07/04/2006 01:13 PM   HDL 58.50 12/01/2017 11:31 AM   CHOLHDL 3 12/01/2017 11:31 AM   LDLCALC 100 (H) 12/01/2017 11:31 AM    Physical Exam:    VS:  BP 140/62   Pulse 86   Ht 5' 1.5" (1.562 m)   Wt 140 lb 6.4 oz (63.7 kg)   LMP 07/19/1988   SpO2 91%   BMI 26.10 kg/m     Wt Readings from Last 3 Encounters:  06/26/18 140 lb 6.4 oz (63.7 kg)  06/21/18 141 lb (64 kg)  04/27/18 141 lb 12.1 oz (64.3 kg)     Physical Exam  Constitutional: She is oriented to person,  place, and time. She appears well-developed and well-nourished. No distress.  HENT:  Head: Normocephalic and atraumatic.  Eyes: No scleral icterus.  Neck: No thyromegaly present.  Cardiovascular: Normal rate and regular rhythm.  Murmur heard.  Diastolic murmur is present with a grade of 2/6 at the lower left sternal border. Pulmonary/Chest: Effort normal. She has no rales.  Abdominal: Soft.  Musculoskeletal: She exhibits no edema.  Neurological: She is alert and oriented to person, place, and time.  Skin: Skin is warm and dry.    ASSESSMENT & PLAN:    Chronic combined systolic and diastolic CHF (congestive heart failure) (HCC) Nonischemic cardiomyopathy.  EF was previously 20-25%.  Recent echo in January 2019 demonstrated EF 60-65%.  Volume status currently looks stable.  She is currently on losartan.  She stopped taking carvedilol due to concerns  over side effects.  However, none of her symptoms changed when she stopped this drug.  I have encouraged her to resume carvedilol 3.125 mg twice daily.  Obtain follow-up BMET today.  Thoracic aortic aneurysm without rupture [facility]) Continue follow-up with cardiothoracic surgery.  Resume beta-blocker as noted above.  Nonrheumatic aortic valve insufficiency Moderate by echocardiogram in January 2019.  Parkinson disease [facility]) Continue follow-up with neurology.  I suspect a lot of her symptoms are related to her Parkinson's disease.  She does not feel that Effexor is providing much benefit.  She should follow-up with primary care for further management of her anxiety and her neurologist for further management of her Parkinson's   Dispo:  Return in about 6 months (around 12/26/2018) for Routine Follow Up, w/ Dr. Acie Fredrickson, or Richardson Dopp, PA-C.   Medication Adjustments/Labs and Tests Ordered: Current medicines are reviewed at length with the patient today.  Concerns regarding medicines are outlined above.  Tests Ordered: Orders Placed This  Encounter  Procedures  . Basic metabolic panel  . EKG 12-Lead   Medication Changes: Meds ordered this encounter  Medications  . carvedilol (COREG) 3.125 MG tablet    Sig: Take 1 tablet (3.125 mg total) by mouth 2 (two) times daily.    Dispense:  180 tablet    Refill:  3    Signed, Richardson Dopp, PA-C  06/26/2018 3:07 PM    [facility] [place], [place], [place]  [address] Phone: [phone]; Fax: [phone]

## 2018-07-14 ENCOUNTER — Telehealth: Payer: Self-pay | Admitting: Family Medicine

## 2018-07-14 NOTE — Telephone Encounter (Signed)
Maia Petties with [facility] contacted our office as they are providing in home support for the patient as a benefit with her insurance and would like to discuss a few health concerns with the clinic staff about the patient's last visit. She can be reached at [phone]

## 2018-07-14 NOTE — Telephone Encounter (Signed)
Spoke to [facility] and they were informing us that they have enrolled the patient in their home visit program. MPulliam, CMA/RT(R)

## 2018-08-01 ENCOUNTER — Telehealth: Payer: Self-pay | Admitting: Neurology

## 2018-08-01 NOTE — Telephone Encounter (Signed)
Jade, I'm confirming that this patient actually needs to be seen this week.  She has an appt but also has one in a few months.  I saw her last month for her hospital f/u from a fall already.  Can you check with patient?

## 2018-08-02 NOTE — Telephone Encounter (Signed)
Left message on machine for patient to call back.  To confirm with patient that it is okay to cancel appt for later this week (just seen one month ago). She has appt scheduled in April. Awaiting call back.

## 2018-08-03 ENCOUNTER — Ambulatory Visit: Payer: Medicare Other | Admitting: Neurology

## 2018-08-03 ENCOUNTER — Encounter: Payer: Self-pay | Admitting: Neurology

## 2018-08-03 VITALS — BP 114/62 | HR 88 | Ht 61.5 in | Wt 141.0 lb

## 2018-08-03 DIAGNOSIS — F411 Generalized anxiety disorder: Secondary | ICD-10-CM | POA: Diagnosis not present

## 2018-08-03 DIAGNOSIS — G20 Parkinson's disease: Secondary | ICD-10-CM | POA: Diagnosis not present

## 2018-08-03 NOTE — Progress Notes (Signed)
Elizabeth Mccullough was seen today in the movement disorders clinic for neurologic consultation at the request of Mellody Dance, DO.  The consultation is for the evaluation of tremor.  Prior medical records were reviewed.  She is accompanied by her friend who supplements the hx.  The pt noted that she began to have tremor about a year ago.  It has increased over the year.  I noted in med records that the pt went to the ED on 04/10/13 after a fall.  Her sandal had caught on the sidewalk and she had to have stitches in her finger.  07/05/13 update:  The patient is following up today regarding her Parkinson's disease, which was just diagnosed last visit, in October 2014.  I started her on levodopa and referred her to the Parkinson's program.  The pt reports that she is taking OTC sleep medication which is helping.  She initially had nausea with the carbidopa/levodopa but is doing much better.  She really isn't sure if the nausea was from the carbidopa/levodopa 25/100 or from the antidepressant she had started but she feels good now.  She does state that she fell once since last visit.  She says that she just "wasn't looking down" and tripped walking around a car door. She insists that she is doing markedly better.  She started driving again.  "I feel almost normal."  She was supposed to go to physical therapy since our last visit, but admits that she had the initial evaluation and 2 subsequent visits, but this was when she was very nauseated and just could not tolerate the therapy.  She really is not doing any outside exercise. I did review records from other physicians since her last visit.  She had a lower extremity venous Doppler that did not demonstrate DVT but did demonstrate a rather extensive superficial thrombus that extended from above the knee to almost the ankle.  11/06/13 update:  Pt is on carbidopa/levodopa 25/100 three times per day.  She is feeling better.  No hallucinations.  No falls.  "I can  walk, I can write, my mood is good."  She is tolerating the effexor well.  She is not exercising.    05/06/14 update:  Pt is f/u today re: PD. She is on carbidopa/levodopa 25/100 tid (7am/12pm/5pm).  She is on effexor for her mood.  She is feeling well.  She reports a few falls since last visit.  One was at her sons house.  She was going up the stairs and was carrying food and fell on her knees.  She had one other minor fall.  No hallucinations.  No lightheadedness or near syncope.  She is not exercising.  She notes that she has some trouble raising the R arm.   02/20/15 update:  The patient is following up today.  I have not seen her since October, 2015.  She had an appointment in April that she canceled because she reported that she was moving.  She is currently on carbidopa/levodopa 25/100, one tablet 3 times per day.   She is tolerating that well.  She remains on Effexor for anxiety and depression.  I reviewed records available to me since last visit.  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  She does not need a follow-up with them for another year and a half.  She is not exercising faithfully.  She does have some tremor of the L hand if nervous or if she is trying to put  on eyebrow pencil.  Wearing off:  No.  How long before next dose:  n/a Falls:   Yes.  , last week; fell over a pillow on the floor; did not get hurt N/V:  No. Hallucinations:  No.  visual distortions: No. Lightheaded:  No.  Syncope: No. Dyskinesia:  No.  11/14/15 update:  The patient is following up today.  I have not seen her since August, 2016.  She has a history of Parkinson's disease and is on carbidopa/levodopa 25/100, one tablet 3 times per day.  More tremor when she drives.  No lightheadedness.  No hallucinations.  She does have a history of depression and is on Effexor.  At our last visit, she reported that she was off of benzodiazepines, but states today that she is back on Xanax.  States that she really isn't taking that.  States that she only takes it if she goes on plane, etc. I did have the opportunity to review records since last visit.  She went to the emergency room on January 12 after having a fall.  She was attempting to get on the bed and fell and hit her head.  This did require stitches.  States that she had about 3 falls in total.    05/03/16 update:  The patient follows up today.  She has history of Parkinson's disease and last visit I increased her carbidopa/levodopa 25/100, so that she was taking one tablet 4 times a day instead of one tablet 3 times per day.  I asked her to take it at 7 AM/11 AM/3 PM/7 PM.  She states that about 30% of the time she takes it qid and about 70% of the time she takes it qid.  She had a fall about 2 months ago.  She was walking down her granddaughters patio and she fell on the L knee.  She is walking the track some for exercise but mostly is doing knee/leg stretches.  She denies lightheadedness or near syncope.  She denies hallucinations.  Has occasional nausea.    11/26/16 update:  Patient seen today in follow-up.  I have not seen her in about 7 months.  She is prescribed carbidopa/levodopa 25/100, one tablet 4 times per day but generally only takes it tid.  States "everything is getting harder for me to do."  She has had "lots and lots" of falls since our last visit.  States that sometimes the tip of her shoe will get caught on her rug and she will fall.  She denies lightheadedness or near syncope.  She denies hallucinations.  No significant exercise (walks to mailbox, etc).    05/30/17 update: Patient seen today in follow-up for her Parkinson's disease. She is accompanied by her daughter who supplements the history.   The patient is prescribed carbidopa/levodopa 25/100, 1 tablet at 7 AM/11 AM/3 PM/7 PM.  She still has trouble taking it and patient states that "I try to take it morning, noon, night."  Daughter states that she isn't taking it  as directed.  Daughter wonders if she has PD.  States that patients husband had PSP and her daughter wonders if she didn't take on the sx's of her husband.  Daughter asks about marijuana and if I can prescribe in a pill. She was in the hospital in August.  She had a heart cath that demonstrated normal coronary arteries.  She does have depressed LV function.  12/05/17 update:   Pt seen in f/u for PD.  She is on  carbidopa/levodopa 25/100, 2/1/1/.  She reports that she is doing well.  She has had falls, but usually from tripping over something.  No hallucinations.  Biggest c/o is dizziness.  The records that were made available to me were reviewed.  She saw cardiology in January.  She was reporting fatigue.  He thought it could be due to her carvedilol, but wanted to check an echocardiogram before decreasing it.  Echocardiogram was completed and her left ventricular ejection fraction had not changed and he did not think her persistent fatigue was related to cardiac function.  She did see her primary care physician just last week about fatigue.  It was felt likely from medication per pt.  She states that she f/u with PCP and told likely likely from meds.   "I was hoping to get xanax from my PCP but I'm not sure that she called it in."  Not driving due to dizziness.  06/21/18 update:  Pt seen in f/u for PD.   Patient is on carbidopa/levodopa 25/100, 2 tablets in the morning, 1 in the afternoon and 1 evening.   No hallucinations.  Last visit, she was significantly orthostatic in the office.  I talked to her other physicians, and we recommended holding losartan to see if that would help.  Records are reviewed since last visit.  She called the cardiologist on June 18 because she had been feeling so tired since starting Coreg.  They told her to stop the medication.  Of note is that she did see thoracic surgery because of her ascending aneurysm, with low risk of dissection.  They do want her blood pressure under good  control but recognize that her medications were being weaned to because of orthostasis.  Patient was in the emergency room on April 27, 2018 after a fall.  She lost her footing going down the stairs and fell on her face.  There is no loss of consciousness.  CT of brain was unremarkable with the exception of a forehead hematoma.  She did have a fracture of the volar plate of the middle finger.  She had 2 after this that she did not seek care for.  The last was 11/22.  She went face first on the cement.  States that she was walking to a car in the dark and fell.  She c/o nausea but then when asked about it she denies it and said its head pressure.  She however denies headache.  Head pressure is bilateral temporal.  States that has been going on for months.  Asks me for xanax.    08/03/18 update: Patient is seen today in follow-up for Parkinson's disease.   Patient is on carbidopa/levodopa 25/100, 2 tablets in the morning, 1 in the afternoon and 1 in the evening.  However, I changed the timing of her dosing, because she was previously waking up in the middle of the night and starting the dosages then.  I told her not to start the dosages until 7 AM, when she woke up for the day.  I sent a referral for physical therapy last visit, although it was not convinced that she was actually going to complete it.  She reports today that one company came to the house twice but then had to change companies because of insurance so she is expecting them to come 1/31.  She did have a head CT after our last visit that was nonacute.  Her mood has been the same.  I encouraged her last visit  to get a Social worker.  She has not done that.  She complained to her cardiology PA about her anxiety as well, and even stopped her carvedilol hoping that it would help with the anxiety.  It did not, and they restarted. She states that she didn't start it yet. I told her to follow back up with primary care but she hasn't done that yet.  She states  that she has a new NP and doesn't think the effexor worked.  She really only wants Xanax, which has been denied by her primary care as well as myself.  PREVIOUS MEDICATIONS: none to date  ALLERGIES:   Allergies  Allergen Reactions  . Iohexol Rash     Code: RASH, Desc: PATIENT STATES SHE IS ALLERGIC TO IV DYE. 20 YRS AGO SHE HAD A REACTION AT [facility], PT WAS GIVEN BENADRYL. 07/13/06/RM, Onset Date: [id number]   . Penicillins Hives    CURRENT MEDICATIONS:  Current Outpatient Medications on File Prior to Visit  Medication Sig Dispense Refill  . aspirin EC 81 MG tablet Take 1 tablet (81 mg total) by mouth daily.    . carbidopa-levodopa (SINEMET IR) 25-100 MG tablet 2 at 9am/1 at 1pm/1 at 5pm 360 tablet 0  . diphenhydrAMINE (SLEEP AID, DIPHENHYDRAMINE,) 25 MG tablet Take 25 mg by mouth at bedtime as needed for sleep.    . furosemide (LASIX) 20 MG tablet Take 20 mg by mouth daily.    Marland Kitchen losartan (COZAAR) 25 MG tablet Take 1 tablet (25 mg total) by mouth daily. 90 tablet 3  . lovastatin (MEVACOR) 20 MG tablet Take 20 mg by mouth daily.    . Melatonin 3 MG CAPS Take 3-10 mg by mouth at bedtime. OTC    . Multiple Vitamin (MULTIVITAMIN) tablet Take 1 tablet by mouth daily.    . potassium chloride (K-DUR) 10 MEQ tablet Take 1 tablet (10 mEq total) by mouth daily. 90 tablet 1  . venlafaxine XR (EFFEXOR-XR) 150 MG 24 hr capsule TAKE 1 CAPSULE BY MOUTH DAILY WITH BREAKFAST. 90 capsule 1   No current facility-administered medications on file prior to visit.     PAST MEDICAL HISTORY:   Past Medical History:  Diagnosis Date  . AAA (abdominal aortic aneurysm) ([place])   . Carotid artery disease ([place]) 01/12/2017   Carotid US 6/18: Bilat < 50%; repeat in 12/2000  . Chronic combined systolic and diastolic CHF (congestive heart failure) ([place]) 03/01/2017   NICM // [place] 8/18: normal coronary arteries /  Echo 8/18: EF 20-25, mod AI, mod MR, PASP 32 // Echo 1/19: EF 55-60, no RWMA, Gr 1 DD, mod AI, mild MR,  PASP 32  . Hyperlipidemia   . Parkinson's disease ([place])     PAST SURGICAL HISTORY:   Past Surgical History:  Procedure Laterality Date  . BREAST CYST ASPIRATION  1965   Right Breast  . HAMMER TOE SURGERY  04/10/02   Left Toe  . KNEE SURGERY Right 10/17/15   meniscus repair  . NASAL SEPTUM SURGERY  1980  . RIGHT/LEFT HEART CATH AND CORONARY ANGIOGRAPHY N/A 03/03/2017   Procedure: RIGHT/LEFT HEART CATH AND CORONARY ANGIOGRAPHY;  Surgeon: Nelva Bush, MD;  Location: [facility];  Service: Cardiovascular;  Laterality: N/A;  . THORACIC AORTOGRAM N/A 03/03/2017   Procedure: Thoracic Aortogram;  Surgeon: Nelva Bush, MD;  Location: [facility];  Service: Cardiovascular;  Laterality: N/A;    SOCIAL HISTORY:   Social History   Socioeconomic History  . Marital  status: Widowed    Spouse name: Not on file  . Number of children: Not on file  . Years of education: Not on file  . Highest education level: Not on file  Occupational History  . Occupation: retired  Scientific laboratory technician  . Financial resource strain: Not on file  . Food insecurity:    Worry: Not on file    Inability: Not on file  . Transportation needs:    Medical: Not on file    Non-medical: Not on file  Tobacco Use  . Smoking status: Never Smoker  . Smokeless tobacco: Never Used  Substance and Sexual Activity  . Alcohol use: Yes    Comment: social wine   . Drug use: No  . Sexual activity: Not Currently    Partners: Male  Lifestyle  . Physical activity:    Days per week: Not on file    Minutes per session: Not on file  . Stress: Not on file  Relationships  . Social connections:    Talks on phone: Not on file    Gets together: Not on file    Attends religious service: Not on file    Active member of club or organization: Not on file    Attends meetings of clubs or organizations: Not on file    Relationship status: Not on file  . Intimate partner violence:    Fear of current or ex partner: Not on  file    Emotionally abused: Not on file    Physically abused: Not on file    Forced sexual activity: Not on file  Other Topics Concern  . Not on file  Social History Narrative   Exercise-- no    FAMILY HISTORY:   Family Status  Relation Name Status  . Mother  Deceased at age 30       chf  . Father  Deceased at age 619       addisons  . Sister  Alive       37  . Brother  Deceased at age birth  . Sister  Alive       96  . Sister  Deceased  . Sister  Deceased at age 39  . Brother  Deceased  . Brother  Deceased at age 91       bleeding ulcer, heart  . Brother  Deceased at age 61       pneumonia  . Unknown  (Not Specified)  . MGM  Deceased  . MGF  Deceased  . PGM  Deceased  . PGF  Deceased    ROS:  Review of Systems  Constitutional: Positive for malaise/fatigue.  HENT: Negative.   Eyes: Negative.   Respiratory: Negative.   Cardiovascular: Negative.   Skin: Negative.   Neurological: Negative.   Endo/Heme/Allergies: Negative.   Psychiatric/Behavioral: The patient is nervous/anxious.      PHYSICAL EXAMINATION:    VITALS:   Vitals:   08/03/18 1442  BP: 114/62  Pulse: 88  SpO2: 96%  Weight: 141 lb (64 kg)  Height: 5' 1.5" (1.562 m)   Wt Readings from Last 3 Encounters:  08/03/18 141 lb (64 kg)  06/26/18 140 lb 6.4 oz (63.7 kg)  06/21/18 141 lb (64 kg)    GEN:  The patient appears stated age and is in NAD. HEENT:  Normocephalic.  Well-healed scar over the left eyebrow is noted.  The mucous membranes are moist. The superficial temporal arteries are without ropiness or tenderness. CV:  RRR Lungs:  CTAB Neck/HEME:  There are no carotid bruits bilaterally.  Neurological examination:  Orientation: The patient is alert and oriented x3. Cranial nerves: There is good facial symmetry. The speech is fluent and clear. Soft palate rises symmetrically and there is no tongue deviation. Hearing is intact to conversational tone. Sensation: Sensation is intact to light  touch throughout Motor: Strength is 5/5 in the bilateral upper and lower extremities.   Shoulder shrug is equal and symmetric.  There is no pronator drift.   Movement examination: Tone: There is mild increased tone in the RUE Abnormal movements: There is mild to mod RUE resting tremor Coordination:  There is mild decremation, with any form of RAMS, including alternating supination and pronation of the forearm, hand opening and closing, finger taps, heel taps and toe taps. Gait and Station: The patient pushes off of the chair.  She is slightly short stepped.  She is stable  Lab Results  Component Value Date   TSH 2.37 12/01/2017   Lab Results  Component Value Date   VITAMINB12 1,213 (H) 12/01/2017     Chemistry      Component Value Date/Time   NA 144 06/26/2018 1110   K 3.9 06/26/2018 1110   CL 101 06/26/2018 1110   CO2 26 06/26/2018 1110   BUN 19 06/26/2018 1110   CREATININE 0.84 06/26/2018 1110   CREATININE 0.79 01/03/2015 1226      Component Value Date/Time   CALCIUM 9.2 06/26/2018 1110   ALKPHOS 69 12/01/2017 1131   AST 13 12/01/2017 1131   ALT 3 12/01/2017 1131   BILITOT 0.6 12/01/2017 1131   BILITOT 0.3 03/01/2017 1520       ASSESSMENT/PLAN:  1.  Parkinson's disease, diagnosed in 04/20/2013.  -continue carbidopa/levodopa 25/100, 2 tablets at 7-8AM, 1 tablet at 11 AM to noon, 1 tablet at 4-5PM  -starting home PT soon 2.  Anxiety and ? Depression  -I think that she could use a Social worker.  She refuses.  She is on effexor.  She doesn't think that this works.  I told her she needs to talk with prescribing physician about this.  Discussed this again today  -asks me several times during the visit for Xanax and explained that this is not good for PD patients.  Said that PCP refused as well.  Explained that she should not be on this.  Explained that this could increase risk for falls. 3.  Hx cardiomyopathy  -encouraged pt to take medication as directed by cardiology.   Stopped carvedilol on own and told by cardiology to restart but didn't. 4.  F/u 5-6 months.

## 2018-08-15 ENCOUNTER — Encounter: Payer: Self-pay | Admitting: Family Medicine

## 2018-08-15 ENCOUNTER — Ambulatory Visit (INDEPENDENT_AMBULATORY_CARE_PROVIDER_SITE_OTHER): Payer: Medicare Other | Admitting: Family Medicine

## 2018-08-15 VITALS — BP 136/80 | HR 84 | Temp 97.7°F | Ht 61.5 in | Wt 142.4 lb

## 2018-08-15 DIAGNOSIS — F411 Generalized anxiety disorder: Secondary | ICD-10-CM
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
## 2018-08-17 ENCOUNTER — Other Ambulatory Visit: Payer: Self-pay

## 2018-08-17 DIAGNOSIS — I10 Essential (primary) hypertension: Secondary | ICD-10-CM

## 2018-08-17 DIAGNOSIS — G4700 Insomnia, unspecified: Secondary | ICD-10-CM

## 2018-08-17 DIAGNOSIS — E2839 Other primary ovarian failure: Secondary | ICD-10-CM

## 2018-08-17 DIAGNOSIS — M8580 Other specified disorders of bone density and structure, unspecified site: Secondary | ICD-10-CM

## 2018-08-17 DIAGNOSIS — I712 Thoracic aortic aneurysm, without rupture, unspecified: Secondary | ICD-10-CM

## 2018-08-17 DIAGNOSIS — E785 Hyperlipidemia, unspecified: Secondary | ICD-10-CM

## 2018-08-17 DIAGNOSIS — R5383 Other fatigue: Secondary | ICD-10-CM

## 2018-08-17 DIAGNOSIS — R739 Hyperglycemia, unspecified: Secondary | ICD-10-CM

## 2018-09-04 ENCOUNTER — Other Ambulatory Visit: Payer: Self-pay | Admitting: Neurology

## 2018-09-04 ENCOUNTER — Other Ambulatory Visit: Payer: Self-pay | Admitting: Cardiovascular Disease

## 2018-09-05 ENCOUNTER — Other Ambulatory Visit: Payer: Self-pay | Admitting: Cardiovascular Disease

## 2018-09-13 ENCOUNTER — Other Ambulatory Visit: Payer: Self-pay | Admitting: Family Medicine

## 2018-09-14 ENCOUNTER — Other Ambulatory Visit: Payer: Self-pay | Admitting: Family Medicine

## 2018-09-14 ENCOUNTER — Other Ambulatory Visit: Payer: Self-pay

## 2018-09-14 DIAGNOSIS — E785 Hyperlipidemia, unspecified: Secondary | ICD-10-CM

## 2018-09-14 MED ORDER — LOVASTATIN 20 MG PO TABS: 20.0000 mg | ORAL_TABLET | Freq: Every day | ORAL | 0 refills | Status: DC

## 2018-09-14 NOTE — Telephone Encounter (Signed)
Patient is requesting a refill of her lovastatin, if approved please send to [facility]

## 2018-09-14 NOTE — Telephone Encounter (Signed)
Patient requesting a refill on lovastatin.  Medication was last filled by pervious provider.  Please review and advise. LOV 08/15/2018. MPulliam, CMA/RT(R)

## 2018-09-14 NOTE — Telephone Encounter (Signed)
Sent request to Dr. Raliegh Scarlet to review as medication was last filled by a pervious provider. MPulliam, CMA/RT(R)

## 2018-09-14 NOTE — Addendum Note (Signed)
Addended by: Lanier Prude D on: 09/14/2018 01:27 PM   Modules accepted: Orders

## 2018-10-24 ENCOUNTER — Encounter

## 2018-10-24 ENCOUNTER — Ambulatory Visit: Payer: Medicare Other | Admitting: Neurology

## 2018-11-01 ENCOUNTER — Other Ambulatory Visit: Payer: Self-pay | Admitting: Cardiovascular Disease

## 2018-11-08 ENCOUNTER — Ambulatory Visit (INDEPENDENT_AMBULATORY_CARE_PROVIDER_SITE_OTHER): Payer: Medicare Other | Admitting: Family Medicine

## 2018-11-08 ENCOUNTER — Other Ambulatory Visit: Payer: Self-pay

## 2018-11-08 ENCOUNTER — Encounter: Payer: Self-pay | Admitting: Family Medicine

## 2018-11-08 VITALS — BP 127/68 | HR 79 | Ht 61.5 in | Wt 136.0 lb

## 2018-11-08 DIAGNOSIS — R300 Dysuria: Secondary | ICD-10-CM

## 2018-11-08 DIAGNOSIS — N3001 Acute cystitis with hematuria: Secondary | ICD-10-CM

## 2018-11-08 LAB — POCT URINALYSIS DIPSTICK
Bilirubin, UA: NEGATIVE
Glucose, UA: NEGATIVE
Ketones, UA: NEGATIVE
Nitrite, UA: NEGATIVE
Protein, UA: POSITIVE — AB
Spec Grav, UA: 1.02 (ref 1.010–1.025)
Urobilinogen, UA: 0.2 E.U./dL
pH, UA: 6.5 (ref 5.0–8.0)

## 2018-11-08 MED ORDER — SULFAMETHOXAZOLE-TRIMETHOPRIM 800-160 MG PO TABS: 1.0000 | ORAL_TABLET | Freq: Two times a day (BID) | ORAL | 0 refills | Status: DC
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
## 2018-11-09 ENCOUNTER — Encounter: Payer: Self-pay | Admitting: Family Medicine

## 2018-11-09 ENCOUNTER — Ambulatory Visit (INDEPENDENT_AMBULATORY_CARE_PROVIDER_SITE_OTHER): Payer: Medicare Other | Admitting: Family Medicine

## 2018-11-09 VITALS — BP 135/71 | HR 79 | Ht 61.5 in | Wt 136.0 lb

## 2018-11-09 DIAGNOSIS — Z23 Encounter for immunization: Secondary | ICD-10-CM

## 2018-11-09 DIAGNOSIS — Z0000 Encounter for general adult medical examination without abnormal findings: Secondary | ICD-10-CM

## 2018-11-09 MED ORDER — ZOSTER VAC RECOMB ADJUVANTED 50 MCG/0.5ML IM SUSR: 0.5000 mL | Freq: Once | INTRAMUSCULAR | 0 refills | Status: AC

## 2018-11-09 NOTE — Progress Notes (Addendum)
Telehealth office visit note for Elizabeth Mccullough, Elizabeth Mccullough- at Primary Care at [facility]   I connected with current patient today and verified that I am speaking with the correct person using two identifiers.   . Location of the patient: Home . Location of the provider: Office Only the patient (+/- their family members at pt's discretion) and myself were participating in the encounter    - This visit type was conducted due to national recommendations for restrictions regarding the COVID-19 Pandemic (e.g. social distancing) in an effort to limit this patient's exposure and mitigate transmission in our community.  This format is felt to be most appropriate for this patient at this time.   - The patient did not have access to video technology or had technical difficulties with video requiring transitioning to audio format only. - No physical exam could be performed with this format, beyond that communicated to [place] by the patient/ family members as noted.   - Additionally my office staff/ schedulers discussed with the patient that there may be a monetary charge related to this service, depending on their medical insurance.   The patient expressed understanding, and agreed to proceed.   Subjective:   Elizabeth Mccullough is a 78 y.o. female who presents for Medicare Annual (Subsequent) preventive examination.  Lives with Daughter- [name]  Only has difficulty getting up from chair if no arms to push up on.  O/w no problems.       Objective:    Vitals: BP 135/71   Pulse 79   Ht 5' 1.5" (1.562 m)   Wt 136 lb (61.7 kg)   LMP 07/19/1988   BMI 25.28 kg/m   Body mass index is 25.28 kg/m.  Advanced Directives 01/25/2018 03/03/2017 11/26/2016 03/05/2016 07/31/2015 02/28/2012  Does Patient Have a Medical Advance Directive? Yes Yes Yes Yes No Patient does not have advance directive;Patient would not like information  Type of Scientist, forensic Power of [place];Living will [place];Living will [place];Living will [place];Living will - -  Does patient want to make changes to medical advance directive? - No - Patient declined - No - Patient declined - -  Copy of [place] in Chart? No - copy requested No - copy requested - No - copy requested - -  Would patient like information on creating a medical advance directive? - - - - No - patient declined information -    Tobacco Social History   Tobacco Use  Smoking Status Never Smoker  Smokeless Tobacco Never Used     Counseling given: Not Answered   Past Medical History:  Diagnosis Date  . AAA (abdominal aortic aneurysm) ([place])   . Carotid artery disease ([place]) 01/12/2017   Carotid US 6/18: Bilat < 50%; repeat in 12/2000  . Chronic combined systolic and diastolic CHF (congestive heart failure) ([place]) 03/01/2017   NICM // [place] 8/18: normal coronary arteries /  Echo 8/18: EF 20-25, mod AI, mod MR, PASP 32 // Echo 1/19: EF 55-60, no RWMA, Gr 1 DD, mod AI, mild MR, PASP 32  . Hyperlipidemia   . Parkinson's disease [facility])    Past Surgical History:  Procedure Laterality Date  . BREAST CYST ASPIRATION  1965   Right Breast  . HAMMER TOE SURGERY  04/10/02   Left Toe  . KNEE SURGERY Right 10/17/15   meniscus repair  . NASAL SEPTUM SURGERY  1980  . RIGHT/LEFT HEART CATH AND CORONARY ANGIOGRAPHY  N/A 03/03/2017   Procedure: RIGHT/LEFT HEART CATH AND CORONARY ANGIOGRAPHY;  Surgeon: Nelva Bush, MD;  Location: [facility];  Service: Cardiovascular;  Laterality: N/A;  . THORACIC AORTOGRAM N/A 03/03/2017   Procedure: Thoracic Aortogram;  Surgeon: Nelva Bush, MD;  Location: [facility];  Service: Cardiovascular;  Laterality: N/A;   Family History  Problem Relation Age of Onset  . Heart disease Mother   . Hyperlipidemia Sister   . Hyperlipidemia Brother   . Diabetes Sister   . Hyperlipidemia Sister   . Cancer Sister 14       colon  .  Cancer Sister        breast  . Stroke Brother   . Hypertension Brother   . Breast cancer Other   . Colon cancer Other    Social History   Socioeconomic History  . Marital status: Widowed    Spouse name: Not on file  . Number of children: Not on file  . Years of education: Not on file  . Highest education level: Not on file  Occupational History  . Occupation: retired  Scientific laboratory technician  . Financial resource strain: Not on file  . Food insecurity:    Worry: Not on file    Inability: Not on file  . Transportation needs:    Medical: Not on file    Non-medical: Not on file  Tobacco Use  . Smoking status: Never Smoker  . Smokeless tobacco: Never Used  Substance and Sexual Activity  . Alcohol use: Yes    Comment: social wine   . Drug use: No  . Sexual activity: Not Currently    Partners: Male  Lifestyle  . Physical activity:    Days per week: Not on file    Minutes per session: Not on file  . Stress: Not on file  Relationships  . Social connections:    Talks on phone: Not on file    Gets together: Not on file    Attends religious service: Not on file    Active member of club or organization: Not on file    Attends meetings of clubs or organizations: Not on file    Relationship status: Not on file  Other Topics Concern  . Not on file  Social History Narrative   Exercise-- no    Outpatient Encounter Medications as of 11/09/2018  Medication Sig  . aspirin EC 81 MG tablet Take 1 tablet (81 mg total) by mouth daily.  . carbidopa-levodopa (SINEMET IR) 25-100 MG tablet TAKE 2 TABLETS BY MOUTH AT 9:00 AM, 1 TABLET AT 1:00 PM, AND 1 TABLET AT 5:00 PM.  . carvedilol (COREG) 3.125 MG tablet Take 3.125 mg by mouth daily.  . diphenhydrAMINE (SLEEP AID, DIPHENHYDRAMINE,) 25 MG tablet Take 25 mg by mouth at bedtime as needed for sleep.  . furosemide (LASIX) 20 MG tablet Take 1 tablet (20 mg total) by mouth daily.  Marland Kitchen losartan (COZAAR) 25 MG tablet Take 1 tablet (25 mg total) by mouth  daily.  Marland Kitchen lovastatin (MEVACOR) 20 MG tablet Take 1 tablet (20 mg total) by mouth at bedtime.  . Melatonin 3 MG CAPS Take 3-10 mg by mouth at bedtime. OTC  . Multiple Vitamin (MULTIVITAMIN) tablet Take 1 tablet by mouth daily.  . potassium chloride (K-DUR) 10 MEQ tablet TAKE 1 TABLET BY MOUTH DAILY.  Marland Kitchen sulfamethoxazole-trimethoprim (BACTRIM DS) 800-160 MG tablet Take 1 tablet by mouth 2 (two) times daily.  Marland Kitchen venlafaxine XR (EFFEXOR-XR) 150 MG 24  hr capsule TAKE 1 CAPSULE BY MOUTH DAILY WITH BREAKFAST.  Marland Kitchen Zoster Vaccine Adjuvanted [facility]) injection Inject 0.5 mLs into the muscle once for 1 dose.   No facility-administered encounter medications on file as of 11/09/2018.     Activities of Daily Living In your present state of health, do you have any difficulty performing the following activities: 11/09/2018 11/08/2018  Hearing? N N  Vision? N N  Difficulty concentrating or making decisions? N N  Walking or climbing stairs? N N  Dressing or bathing? N N  Doing errands, shopping? N N  Some recent data might be hidden   Activities of Daily Living In your present state of health, do you have difficulty performing the following activities?  1- Driving - no 2- Managing money - no 3- Feeding yourself - no 4- Getting from the bed to the chair - no 5- Climbing a flight of stairs - no 6- Preparing food and eating - no 7- Bathing or showering - no 8- Getting dressed - no 9- Getting to the toilet - no 10- Using the toilet - no 11- Moving around from place to place - no  Patient states that she feels safe in the home.  Patient Care Team: Elizabeth Dance, DO as PCP - General (Family Medicine) Nahser, Wonda Cheng, MD as PCP - Cardiology (Cardiology) Tat, Eustace Quail, DO as Consulting Physician (Neurology) Prescott Gum, Collier Salina, MD as Consulting Physician (Cardiothoracic Surgery) Shon Hough, MD as Consulting Physician (Ophthalmology) Paralee Cancel, MD as Consulting Physician (Orthopedic  Surgery)      GAD 7 : Generalized Anxiety Score 11/09/2018 08/15/2018  Nervous, Anxious, on Edge 3 3  Control/stop worrying 0 0  Worry too much - different things 0 0  Trouble relaxing 0 0  Restless 0 0  Easily annoyed or irritable 0 0  Afraid - awful might happen 0 0  Total GAD 7 Score 3 3  Anxiety Difficulty Somewhat difficult Not difficult at all      Depression screen [facility] 2/9 11/09/2018 11/08/2018 08/15/2018 03/15/2018 01/25/2018  Decreased Interest 0 0 2 3 0  Down, Depressed, Hopeless 0 0 0 2 0  PHQ - 2 Score 0 0 2 5 0  Altered sleeping 1 1 0 1 -  Tired, decreased energy 1 1 2 3  -  Change in appetite 0 0 2 1 -  Feeling bad or failure about yourself  0 0 0 1 -  Trouble concentrating 0 0 0 1 -  Moving slowly or fidgety/restless 0 0 1 1 -  Suicidal thoughts 0 0 0 0 -  PHQ-9 Score 2 2 7 13  -  Difficult doing work/chores Not difficult at all Not difficult at all Somewhat difficult Somewhat difficult -    Assessment:   This is a routine wellness examination for [place].  Exercise Activities and Dietary recommendations Current Exercise Habits: Home exercise routine, Type of exercise: walking(walks to the mail box couple times a day and around the house), Frequency (Times/Week): >7, Intensity: Mild   Fall Risk Fall Risk  08/15/2018 08/03/2018 06/21/2018 03/15/2018 01/25/2018  Falls in the past year? 0 1 1 No Yes  Number falls in past yr: - 1 1 - 2 or more  Injury with Fall? - 0 0 - -  Comment - - - - -  Risk Factor Category  - - - - High Fall Risk  Risk for fall due to : - History of fall(s) Impaired balance/gait;History of fall(s) - -  Risk for fall  due to: Comment - - - - -  Follow up Falls evaluation completed Falls evaluation completed Falls evaluation completed - -   Is the patient's home free of loose throw rugs in walkways, pet beds, electrical cords, etc?   yes      Grab bars in the bathroom? yes      Handrails on the stairs?   yes      Adequate lighting?    yes   Depression Screen PHQ 2/9 Scores 11/09/2018 11/08/2018 08/15/2018 03/15/2018  PHQ - 2 Score 0 0 2 5  PHQ- 9 Score 2 2 7 13     Depression screen [facility] 2/9 11/09/2018 11/08/2018 08/15/2018 03/15/2018 01/25/2018  Decreased Interest 0 0 2 3 0  Down, Depressed, Hopeless 0 0 0 2 0  PHQ - 2 Score 0 0 2 5 0  Altered sleeping 1 1 0 1 -  Tired, decreased energy 1 1 2 3  -  Change in appetite 0 0 2 1 -  Feeling bad or failure about yourself  0 0 0 1 -  Trouble concentrating 0 0 0 1 -  Moving slowly or fidgety/restless 0 0 1 1 -  Suicidal thoughts 0 0 0 0 -  PHQ-9 Score 2 2 7 13  -  Difficult doing work/chores Not difficult at all Not difficult at all Somewhat difficult Somewhat difficult -    GAD 7 : Generalized Anxiety Score 11/09/2018 08/15/2018  Nervous, Anxious, on Edge 3 3  Control/stop worrying 0 0  Worry too much - different things 0 0  Trouble relaxing 0 0  Restless 0 0  Easily annoyed or irritable 0 0  Afraid - awful might happen 0 0  Total GAD 7 Score 3 3  Anxiety Difficulty Somewhat difficult Not difficult at all    GAD 7 : Generalized Anxiety Score 11/09/2018 08/15/2018  Nervous, Anxious, on Edge 3 3  Control/stop worrying 0 0  Worry too much - different things 0 0  Trouble relaxing 0 0  Restless 0 0  Easily annoyed or irritable 0 0  Afraid - awful might happen 0 0  Total GAD 7 Score 3 3  Anxiety Difficulty Somewhat difficult Not difficult at all     Cognitive Function     6CIT Screen 11/09/2018  What Year? 0 points  What month? 0 points  What time? 0 points  Count back from 20 0 points  Months in reverse 0 points  Repeat phrase 10 points  Total Score 10    Immunization History  Administered Date(s) Administered  . Influenza Split 05/11/2011, 05/10/2012  . Influenza Whole 05/16/2007, 04/16/2008, 05/19/2009, 04/29/2010  . Influenza, High Dose Seasonal PF 05/24/2013, 05/15/2015, 05/13/2016, 04/20/2018  . Influenza,inj,Quad PF,6+ Mos 04/29/2014  .  Influenza-Unspecified 05/16/2017  . Pneumococcal Conjugate-13 01/16/2014  . Pneumococcal Polysaccharide-23 06/03/2004  . Td 07/05/2006  . Tdap 04/10/2013, 04/28/2018    Qualifies for Shingles Vaccine? Sent RX to the pharmacy   Screening Tests Health Maintenance  Topic Date Due  . MAMMOGRAM  01/26/2019 (Originally 01/05/2018)  . INFLUENZA VACCINE  02/17/2019  . TETANUS/TDAP  04/28/2028  . DEXA SCAN  Completed  . PNA vac Low Risk Adult  Completed    Cancer Screenings: Lung: Low Dose CT Chest recommended if Age 28-80 years, 30 pack-year currently smoking OR have quit w/in 15years. Patient does not qualify. Breast:  Up to date on Mammogram? 12/2018 per patient - requested results  [place] Up to date of Bone Density/Dexa? 2019 per patient -  requested results from [facility] Colorectal:      Plan:    I have personally reviewed and noted the following in the patient's chart:   . Medical and social history . Use of alcohol, tobacco or illicit drugs  . Current medications and supplements . Functional ability and status . Nutritional status . Physical activity . Advanced directives . List of other physicians . Hospitalizations, surgeries, and ER visits in previous 12 months . Vitals . Screenings to include cognitive, depression, and falls . Referrals and appointments  In addition, I have reviewed and discussed with patient certain preventive protocols, quality metrics, and best practice recommendations. A written personalized care plan for preventive services as well as general preventive health recommendations were provided to patient.     Elizabeth Dance, DO  11/09/2018

## 2018-11-10 ENCOUNTER — Telehealth: Payer: Self-pay | Admitting: Family Medicine

## 2018-11-10 LAB — URINE CULTURE

## 2018-11-10 NOTE — Telephone Encounter (Signed)
I called patient at 11:34 am today at [phone] to let her know about the recent urine culture results that was obtained 2 days ago.  The voicemail clearly was the patient's voice, thus I felt comfortable leaving the following message  - left message on VM to tell her culture results were negative and to only take 3 days of the antibiotic Bactrim and she can totally stop it after that.  Notified her that there was not significant amount of bacteria found in the urine thus, it did not show she had a bacterial infection of her urine  -Also at 11:37 I also called [phone] in an attempt to contact patient.  However it was somebody else's voice and the message stated it was a different residence besides [place] so I did not leave a message.

## 2018-11-13 ENCOUNTER — Ambulatory Visit: Payer: Medicare Other | Admitting: Neurology

## 2018-12-12 ENCOUNTER — Other Ambulatory Visit: Payer: Self-pay | Admitting: Family Medicine

## 2018-12-12 DIAGNOSIS — F329 Major depressive disorder, single episode, unspecified: Secondary | ICD-10-CM

## 2018-12-12 DIAGNOSIS — E785 Hyperlipidemia, unspecified: Secondary | ICD-10-CM

## 2018-12-12 DIAGNOSIS — F32A Depression, unspecified: Secondary | ICD-10-CM

## 2018-12-12 DIAGNOSIS — F411 Generalized anxiety disorder: Secondary | ICD-10-CM

## 2019-01-02 NOTE — Progress Notes (Deleted)
    Virtual Visit via Telephone Note The purpose of this virtual visit is to provide medical care while limiting exposure to the novel coronavirus.    Consent was obtained for phone visit:  {yes no:314532} Answered questions that patient had about telehealth interaction:  {yes no:314532} I discussed the limitations, risks, security and privacy concerns of performing an evaluation and management service by telephone. I also discussed with the patient that there may be a patient responsible charge related to this service. The patient expressed understanding and agreed to proceed.  Pt location: Home Physician Location: office Name of referring provider:  Mellody Dance, DO I connected with .Elizabeth Mccullough at patients initiation/request on 01/05/2019 at  3:00 PM EDT by telephone and verified that I am speaking with the correct person using two identifiers.  Pt MRN:  [id number] Pt DOB:  [birth year]   History of Present Illness: *** Patient seen today in follow-up for Parkinson's disease.  She is on carbidopa/levodopa 25/100, 2 tablets at 7 AM, 1 tablet at 11 AM, 1 tablet at 4 PM.  Pt denies falls.  Pt denies lightheadedness, near syncope.  No hallucinations.  Primary care has tried to address her anxiety with her.  I have reviewed primary care records since our last visit.   Observations/Objective:  ***   Assessment and Plan:  *** 1.  Parkinson's disease, diagnosed October, 2014  -Continue carbidopa/levodopa 25/100, 2 tablets at 7 AM, 1 tablet at 11 AM, 1 tablet at 4 PM 2.  Anxiety and possible depression  -Counseling would be of value.  Patient refuses.  -Effexor being managed by primary care. 3.  History of cardiomyopathy  -Following with cardiology.  Follow Up Instructions:    -I discussed the assessment and treatment plan with the patient. The patient was provided an opportunity to ask questions and all were answered. The patient agreed with the plan and demonstrated an  understanding of the instructions.   The patient was advised to call back or seek an in-person evaluation if the symptoms worsen or if the condition fails to improve as anticipated.    Total Time spent in visit with the patient was:  ***, of which 100% of the time was spent in counseling and/or coordinating care on ***.   Pt understands and agrees with the plan of care outlined.     Alonza Bogus, DO

## 2019-01-03 ENCOUNTER — Telehealth: Payer: Self-pay

## 2019-01-03 NOTE — Telephone Encounter (Signed)
Called spoke with patient daughter [name] she states that patient has not been in Hospital she is not sure where that came from. She states that she is going out of town and to cancel this appt.

## 2019-01-03 NOTE — Telephone Encounter (Signed)
Yes I spoke with daughter about follow up she said to just cancel she will contact office to make follow up later

## 2019-01-03 NOTE — Telephone Encounter (Signed)
-----   Message from Gilman, DO sent at 01/02/2019  5:48 PM EDT ----- Appointment line says hospital follow up.  Can you find out where she was hospitalized and why?  I see no notes about it

## 2019-01-03 NOTE — Telephone Encounter (Signed)
Noted.  Glad we called then to at least find out that they were not going to come.  Make sure they make another follow up appointment

## 2019-01-04 ENCOUNTER — Ambulatory Visit: Payer: Medicare Other | Admitting: Neurology

## 2019-01-05 ENCOUNTER — Telehealth: Payer: Medicare Other | Admitting: Neurology

## 2019-01-08 ENCOUNTER — Other Ambulatory Visit: Payer: Medicare Other

## 2019-01-10 ENCOUNTER — Ambulatory Visit: Payer: Medicare Other | Admitting: Family Medicine

## 2019-01-15 ENCOUNTER — Ambulatory Visit: Payer: Medicare Other | Admitting: Family Medicine

## 2019-01-17 ENCOUNTER — Telehealth: Payer: Self-pay | Admitting: Nurse Practitioner

## 2019-01-17 NOTE — Telephone Encounter (Signed)
Left message regarding patient's appointment with Dr. Acie Fredrickson on 7/10. I advised that Dr. Acie Fredrickson is in the office on that day and that if she is not comfortable with a clinic visit, to please call back to reschedule to a day when he is doing virtual visits.

## 2019-01-22 NOTE — Telephone Encounter (Signed)
YOUR CARDIOLOGY TEAM HAS ARRANGED FOR AN E-VISIT FOR YOUR APPOINTMENT - PLEASE REVIEW IMPORTANT INFORMATION BELOW SEVERAL DAYS PRIOR TO YOUR APPOINTMENT  Due to the recent COVID-19 pandemic, we are transitioning in-person office visits to tele-medicine visits in an effort to decrease unnecessary exposure to our patients, their families, and staff. These visits are billed to your insurance just like a normal visit is. We also encourage you to sign up for MyChart if you have not already done so. You will need a smartphone if possible. For patients that do not have this, we can still complete the visit using a regular telephone but do prefer a smartphone to enable video when possible. You may have a family member that lives with you that can help. If possible, we also ask that you have a blood pressure cuff and scale at home to measure your blood pressure, heart rate and weight prior to your scheduled appointment. Patients with clinical needs that need an in-person evaluation and testing will still be able to come to the office if absolutely necessary. If you have any questions, feel free to call our office.     YOUR PROVIDER WILL BE USING THE FOLLOWING PLATFORM TO COMPLETE YOUR VISIT: Doxy.Me  . IF USING DOXIMITY or [email or web address].ME - The staff will give you instructions on receiving your link to join the meeting the day of your visit.    2-3 DAYS BEFORE YOUR APPOINTMENT  You will receive a telephone call from one of our [place] team members - your caller ID may say "Unknown caller." If this is a video visit, we will walk you through how to get the video launched on your phone. We will remind you check your blood pressure, heart rate and weight prior to your scheduled appointment. If you have an Apple Watch or Kardia, please upload any pertinent ECG strips the day before or morning of your appointment to [place]. Our staff will also make sure you have reviewed the consent and agree to move forward with your  scheduled tele-health visit.    THE DAY OF YOUR APPOINTMENT  Approximately 15 minutes prior to your scheduled appointment, you will receive a telephone call from one of [place] team - your caller ID may say "Unknown caller."  Our staff will confirm medications, vital signs for the day and any symptoms you may be experiencing. Please have this information available prior to the time of visit start. It may also be helpful for you to have a pad of paper and pen handy for any instructions given during your visit. They will also walk you through joining the smartphone meeting if this is a video visit.   CONSENT FOR TELE-HEALTH VISIT - PLEASE REVIEW  I hereby voluntarily request, consent and authorize [facility] and its employed or contracted physicians, physician assistants, nurse practitioners or other licensed health care professionals (the Practitioner), to provide me with telemedicine health care services (the "Services") as deemed necessary by the treating Practitioner. I acknowledge and consent to receive the Services by the Practitioner via telemedicine. I understand that the telemedicine visit will involve communicating with the Practitioner through live audiovisual communication technology and the disclosure of certain medical information by electronic transmission. I acknowledge that I have been given the opportunity to request an in-person assessment or other available alternative prior to the telemedicine visit and am voluntarily participating in the telemedicine visit.  I understand that I have the right to withhold or withdraw my consent to the use of telemedicine in  the course of my care at any time, without affecting my right to future care or treatment, and that the Practitioner or I may terminate the telemedicine visit at any time. I understand that I have the right to inspect all information obtained and/or recorded in the course of the telemedicine visit and may receive copies of  available information for a reasonable fee.  I understand that some of the potential risks of receiving the Services via telemedicine include:  Marland Kitchen Delay or interruption in medical evaluation due to technological equipment failure or disruption; . Information transmitted may not be sufficient (e.g. poor resolution of images) to allow for appropriate medical decision making by the Practitioner; and/or  . In rare instances, security protocols could fail, causing a breach of personal health information.  Furthermore, I acknowledge that it is my responsibility to provide information about my medical history, conditions and care that is complete and accurate to the best of my ability. I acknowledge that Practitioner's advice, recommendations, and/or decision may be based on factors not within their control, such as incomplete or inaccurate data provided by me or distortions of diagnostic images or specimens that may result from electronic transmissions. I understand that the practice of medicine is not an exact science and that Practitioner makes no warranties or guarantees regarding treatment outcomes. I acknowledge that I will receive a copy of this consent concurrently upon execution via email to the email address I last provided but may also request a printed copy by calling the office of [place].    I understand that my insurance will be billed for this visit.   I have read or had this consent read to me. . I understand the contents of this consent, which adequately explains the benefits and risks of the Services being provided via telemedicine.  . I have been provided ample opportunity to ask questions regarding this consent and the Services and have had my questions answered to my satisfaction. . I give my informed consent for the services to be provided through the use of telemedicine in my medical care  By participating in this telemedicine visit I agree to the above.

## 2019-01-23 ENCOUNTER — Telehealth (INDEPENDENT_AMBULATORY_CARE_PROVIDER_SITE_OTHER): Payer: Medicare Other | Admitting: Cardiovascular Disease

## 2019-01-23 ENCOUNTER — Encounter: Payer: Self-pay | Admitting: Cardiovascular Disease

## 2019-01-23 ENCOUNTER — Telehealth: Payer: Self-pay | Admitting: Family Medicine

## 2019-01-23 ENCOUNTER — Other Ambulatory Visit: Payer: Self-pay

## 2019-01-23 VITALS — BP 141/76 | HR 92 | Ht 61.5 in | Wt 134.0 lb

## 2019-01-23 DIAGNOSIS — I5042 Chronic combined systolic (congestive) and diastolic (congestive) heart failure: Secondary | ICD-10-CM | POA: Diagnosis not present

## 2019-01-23 DIAGNOSIS — I712 Thoracic aortic aneurysm, without rupture: Secondary | ICD-10-CM | POA: Diagnosis not present

## 2019-01-23 DIAGNOSIS — I7121 Aneurysm of the ascending aorta, without rupture: Secondary | ICD-10-CM | POA: Insufficient documentation

## 2019-01-23 DIAGNOSIS — Z7189 Other specified counseling: Secondary | ICD-10-CM

## 2019-01-23 MED ORDER — BISOPROLOL FUMARATE 5 MG PO TABS: 2.5000 mg | ORAL_TABLET | Freq: Every day | ORAL | 3 refills | Status: DC

## 2019-01-23 NOTE — Patient Instructions (Signed)
Medication Instructions:  Your physician has recommended you make the following change in your medication:  STOP Carvedilol (Coreg) START Bisoprolol 2.5 mg (1/2 tablet) once daily  If you need a refill on your cardiac medications before your next appointment, please call your pharmacy.    Lab work: None Ordered   Testing/Procedures: None Ordered   Follow-Up: At [facility], you and your health needs are our priority.  As part of our continuing mission to provide you with exceptional heart care, we have created designated Provider Care Teams.  These Care Teams include your primary Cardiologist (physician) and Advanced Practice Providers (APPs -  Physician Assistants and Nurse Practitioners) who all work together to provide you with the care you need, when you need it. You will need a follow up appointment in:  6 months.  Please call our office 2 months in advance to schedule this appointment.  You may see Mertie Moores, MD or one of the following Advanced Practice Providers on your designated Care Team: Richardson Dopp, PA-C [place], [place] . Daune Perch, NP

## 2019-01-23 NOTE — Progress Notes (Signed)
Virtual Visit via Video Note   This visit type was conducted due to national recommendations for restrictions regarding the COVID-19 Pandemic (e.g. social distancing) in an effort to limit this patient's exposure and mitigate transmission in our community.  Due to her co-morbid illnesses, this patient is at least at moderate risk for complications without adequate follow up.  This format is felt to be most appropriate for this patient at this time.  All issues noted in this document were discussed and addressed.  A limited physical exam was performed with this format.  Please refer to the patient's chart for her consent to telehealth for Elizabeth Mccullough.   Date:  7/7/Mccullough   ID:  Elizabeth Mccullough, DOB 12-27-1940, MRN [id number]  Patient Location: Home Provider Location: Home  PCP:  Elizabeth Dance, DO  Cardiologist:  Elizabeth Moores, MD  Electrophysiologist:  None   Problem list 1. 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 Hyperlipidemia 3.  Parkinson's disease 4.  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 3  5.  Mild carotid artery disease     Chief Complaint  Patient presents with  . Congestive Heart Failure    Elizabeth Mccullough is a 78 y.o. female with a hx of Parkinsons, hyperlipidemia . 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 Recent echo shows new EF of 20-25%.  Previous echo in 2013  showed EF 55-60%.  Increased shortness of breath for the past several months .   No PND or orthopnea. No leg swelling .    Does not eat much salt  No CP.  Not able to exercise much secondary to frequent falling and parkinson .  Walks with a cane.   Oct. 23, 2018:     Elizabeth Mccullough had a cath since her last office visit Normal coronaries. 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 She is followed by Elizabeth Mccullough.   Was started on Coreg.   Is not as  short of breath as she was previoiusly  She eats a very low-salt diet.  August 03, 2017: Elizabeth Mccullough is seen today for follow-up of her chronic combined systolic and diastolic congestive heart failure.  She has had a heart catheterization and was found to have normal coronary arteries.  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  She is seen and has been followed by Dr. Prescott Mccullough. No CP or dyspnea. Has some fatigue,  Is tired frequently .   BP is low ( on Losartan and Sinimet)   CHF was diagnosed in Aug. 2018.   She is not feeling any better since that time   Sept. 18, 2019:  Elizabeth Mccullough is seen back today for follow-up of her chronic combined systolic and diastolic congestive heart failure.  She has normal coronary arteries by heart catheterization.   Most recent echocardiogram in January, 2019 reveals normal left ventricular systolic function with ejection fraction of 55 to 60%.  She has grade 1 diastolic dysfunction.  There is moderate aortic insufficiency.  She has moderate aortic insufficiency with mild dilatation of the aortic root and has been followed by Dr. Prescott Mccullough.  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  There is been no change of the past several years.  She is had some problems with orthostasis in  the past.  We have titrated her medications slightly.  Has some questions for me today   She has stopped her Coreg ( daughter recommended that she stop it )  She also stopped her Losartan   Im not clear why she has stopped her Coreg and Losartan .    Evaluation Performed:  Follow-Up Visit  Chief Complaint:  Chronic combined CHF   Elizabeth Mccullough :    Elizabeth Mccullough is a 78 y.o. female with hx of chronic combined  CHF ( original jEF 25% - now EF has improved to 65% with meds )  , dilated asc. Aorta ( followed by Dr. Prescott Mccullough)  Was last seen by Elizabeth Mccullough in Dec. 2019   She had stopped coreg - Elizabeth Mccullough encouraged her to restart.  She had stopped due to increased depression.   Has been having some headaches.   No cp ,  Has some dyspnea.  Unable to exercise .  Seems to be completely limited by her anxiety and to a lesser degree is limited by her parkinsons.  Lives with daughter,  Elizabeth Mccullough. Walks to [employer] ,  Lots of anxiety.    The patient does not have symptoms concerning for COVID-19 infection (fever, chills, cough, or new shortness of breath).    Past Medical History:  Diagnosis Date  . AAA (abdominal aortic aneurysm) ([place])   . Carotid artery disease ([place]) 01/12/2017   Carotid US 6/18: Bilat < 50%; repeat in 12/2000  . Chronic combined systolic and diastolic CHF (congestive heart failure) ([place]) 03/01/2017   NICM // [place] 8/18: normal coronary arteries /  Echo 8/18: EF 20-25, mod AI, mod MR, PASP 32 // Echo 1/19: EF 55-60, no RWMA, Gr 1 DD, mod AI, mild MR, PASP 32  . Hyperlipidemia   . Parkinson's disease [facility])    Past Surgical History:  Procedure Laterality Date  . BREAST CYST ASPIRATION  1965   Right Breast  . HAMMER TOE SURGERY  04/10/02   Left Toe  . KNEE SURGERY Right 10/17/15   meniscus repair  . NASAL SEPTUM SURGERY  1980  . RIGHT/LEFT HEART CATH AND CORONARY ANGIOGRAPHY N/A 03/03/2017   Procedure: RIGHT/LEFT HEART CATH AND CORONARY ANGIOGRAPHY;  Surgeon: Nelva Bush, MD;  Location: [place] [facility];  Service: Cardiovascular;  Laterality: N/A;  . THORACIC AORTOGRAM N/A 03/03/2017   Procedure: Thoracic Aortogram;  Surgeon: Nelva Bush, MD;  Location: [facility];  Service: Cardiovascular;  Laterality: N/A;     Current Meds  Medication Sig  . aspirin EC 81 MG tablet Take 1 tablet (81 mg total) by mouth daily.  . carbidopa-levodopa (SINEMET IR) 25-100 MG tablet TAKE 2 TABLETS BY MOUTH AT 9:00 AM, 1 TABLET AT 1:00 PM, AND 1 TABLET AT 5:00 PM.  . carvedilol (COREG) 3.125 MG tablet Take 3.125 mg by mouth daily.  . diphenhydrAMINE  (SLEEP AID, DIPHENHYDRAMINE,) 25 MG tablet Take 25 mg by mouth at bedtime as needed for sleep.  . furosemide (LASIX) 20 MG tablet Take 1 tablet (20 mg total) by mouth daily.  Marland Kitchen losartan (COZAAR) 25 MG tablet Take 1 tablet (25 mg total) by mouth daily.  Marland Kitchen lovastatin (MEVACOR) 20 MG tablet TAKE 1 TABLET BY MOUTH AT BEDTIME.  . Melatonin 3 MG CAPS Take 3-10 mg by mouth as needed. OTC  . Multiple Vitamin (MULTIVITAMIN) tablet Take 1 tablet by mouth daily.  . potassium chloride (K-DUR) 10 MEQ tablet TAKE 1 TABLET BY MOUTH DAILY.  Marland Kitchen  venlafaxine XR (EFFEXOR-XR) 150 MG 24 hr capsule TAKE 1 CAPSULE BY MOUTH DAILY WITH BREAKFAST.     Allergies:   Iohexol and Penicillins   Social History   Tobacco Use  . Smoking status: Never Smoker  . Smokeless tobacco: Never Used  Substance Use Topics  . Alcohol use: Yes    Comment: social wine   . Drug use: No     Family Hx: The patient's family history includes Breast cancer in an other family member; Cancer in her sister; Cancer (age of onset: 72) in her sister; Colon cancer in an other family member; Diabetes in her sister; Heart disease in her mother; Hyperlipidemia in her brother, sister, and sister; Hypertension in her brother; Stroke in her brother.  ROS:   Please see the history of present illness.     All other systems reviewed and are negative.   Prior CV studies:   The following studies were reviewed today:    Labs/Other Tests and Data Reviewed:    EKG:  No ECG reviewed.  Recent Labs: 06/26/2018: BUN 19; Creatinine, Ser 0.84; Potassium 3.9; Sodium 144   Recent Lipid Panel Lab Results  Component Value Date/Time   CHOL 175 12/01/2017 11:31 AM   TRIG 82.0 12/01/2017 11:31 AM   TRIG 78 07/04/2006 01:13 PM   HDL 58.50 12/01/2017 11:31 AM   CHOLHDL 3 12/01/2017 11:31 AM   LDLCALC 100 (H) 12/01/2017 11:31 AM    Wt Readings from Last 3 Encounters:  01/23/19 134 lb (60.8 kg)  11/09/18 136 lb (61.7 kg)  11/08/18 136 lb (61.7 kg)      Objective:    Vital Signs:  BP (!) 141/76 (BP Location: Left Arm, Patient Position: Sitting, Cuff Size: Normal)   Pulse 92   Ht 5' 1.5" (1.562 m)   Wt 134 lb (60.8 kg)   LMP 07/19/1988   BMI 24.91 kg/m    VITAL SIGNS:  reviewed GEN:  no acute distress EYES:  sclerae anicteric, EOMI - Extraocular Movements Intact RESPIRATORY:  normal respiratory effort, symmetric expansion CARDIOVASCULAR:  no peripheral edema SKIN:  no rash, lesions or ulcers. MUSCULOSKELETAL:  no obvious deformities. NEURO:  alert and oriented x 3, no obvious focal deficit PSYCH:  normal affect  ASSESSMENT & PLAN:    1. CHF:   EF has normalized .   She  Does not tolerate coreg - has incresed depression and anxiety.   Will DC coreg and try Bisoprolol 2.5 mg a day .   Cont other meds Will see her in 6 months   2.  Parkinsons:   As per her primary md  3. Anxiety:  This seems to be her main issue. Advised her to see her primary MD   COVID-19 Education: The signs and symptoms of COVID-19 were discussed with the patient and how to seek care for testing (follow up with PCP or arrange E-visit).  The importance of social distancing was discussed today.  Time:   Today, I have spent  18  minutes with the patient with telehealth technology discussing the above problems.     Medication Adjustments/Labs and Tests Ordered: Current medicines are reviewed at length with the patient today.  Concerns regarding medicines are outlined above.   Tests Ordered: No orders of the defined types were placed in this encounter.   Medication Changes: No orders of the defined types were placed in this encounter.   Follow Up:  Virtual Visit or In Person in 3 month(s)  Signed, Arnette Norris  Jeannia Tatro, MD  7/7/Mccullough 8:46 AM    [facility]

## 2019-01-23 NOTE — Telephone Encounter (Signed)
Patient will need an office visit to discuss med management/possible adjustment of her Effexor and or adding a second medicine etc.   Schedule her next available -Please put in an urgent referral for psychology - needs a counselor for CBT-preferably 1 that specializes in anxiety  -In meantime, please see if patient has ever been on anything else for her anxiety and or a higher dose of Effexor and how she did with it.  -Additionally, if symptoms worsen and patient cannot wait until an office visit with me, I would recommend urgent care or actually better would be to go to the behavioral health ED/ UC for acute treatment  -Due to her age and multiple core morbidities, I do not just want to adjust medicines over the phone

## 2019-01-23 NOTE — Telephone Encounter (Signed)
Daughter notified - patient unable to come in tomorrow at 49 30 - appt made for Thursday at 11 30

## 2019-01-23 NOTE — Telephone Encounter (Signed)
Pt's daughter states pt has High Anxiety & Rx isn't working  (daughter wishes nurse/ provider to call her w/ advice or to allow pt to come in for OV--advised her Dr. Raliegh Scarlet  Does NOT have any available appts today.  ----forwarding request to med assistant to call Ms. Younts @ [phone] asap.  --glh

## 2019-01-23 NOTE — Telephone Encounter (Signed)
Patient's daughter called and states that the patient is having extreme anxiety recently and is c/o SOB and headache daily.  Patient will not leave the house and is not active - she goes from the chair to the loveseat to the bed daily.  Patient will not take a shower and does not want to eat right.  Daughter states that she is going out of town Friday and this has the patient even more anxious - daughter has someone coming in to sit with her while gone.  Patient's son in law passed recently.  Patient had cards appointment today. MPulliam, CMA/RT(R)

## 2019-01-25 ENCOUNTER — Encounter: Payer: Self-pay | Admitting: Family Medicine

## 2019-01-25 ENCOUNTER — Other Ambulatory Visit: Payer: Self-pay

## 2019-01-25 ENCOUNTER — Ambulatory Visit (INDEPENDENT_AMBULATORY_CARE_PROVIDER_SITE_OTHER): Payer: Medicare Other | Admitting: Family Medicine

## 2019-01-25 VITALS — BP 144/78 | HR 71 | Temp 98.5°F | Ht 61.5 in | Wt 134.4 lb

## 2019-01-25 DIAGNOSIS — R531 Weakness: Secondary | ICD-10-CM

## 2019-01-25 DIAGNOSIS — E785 Hyperlipidemia, unspecified: Secondary | ICD-10-CM

## 2019-01-25 DIAGNOSIS — G20 Parkinson's disease: Secondary | ICD-10-CM

## 2019-01-25 DIAGNOSIS — R296 Repeated falls: Secondary | ICD-10-CM

## 2019-01-25 DIAGNOSIS — I5042 Chronic combined systolic (congestive) and diastolic (congestive) heart failure: Secondary | ICD-10-CM

## 2019-01-25 DIAGNOSIS — F411 Generalized anxiety disorder: Secondary | ICD-10-CM | POA: Diagnosis not present

## 2019-01-25 DIAGNOSIS — R110 Nausea: Secondary | ICD-10-CM

## 2019-01-25 DIAGNOSIS — I428 Other cardiomyopathies: Secondary | ICD-10-CM
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
## 2019-01-26 ENCOUNTER — Ambulatory Visit: Payer: Medicare Other | Admitting: Cardiovascular Disease

## 2019-01-29 ENCOUNTER — Telehealth: Payer: Self-pay | Admitting: Neurology

## 2019-01-29 ENCOUNTER — Telehealth: Payer: Self-pay | Admitting: Cardiovascular Disease

## 2019-01-29 NOTE — Telephone Encounter (Signed)
Spoke with patient who states she thinks she is having a reaction to bisoprolol 2.5 mg. She states she has taken 3 doses and feels very fatigued. I asked her to verify that she stopped carvedilol and she confirmed that she stopped it prior to starting bisoprolol. She asks which is the better medication and I explained that they are similar medications and that Dr. Acie Fredrickson wanted her to try bisoprolol in the place of carvedilol due to complaints of fatigue while taking carvedilol. I asked patient if she agrees to continue bisoprolol for a while longer to see if she can tolerate it. She verbalized understanding and agreement and states she wants to take the one that is better for her. I advised her to call back at the end of the week to let us know if her symptoms do not improve. She verbalized agreement and thanked me for the call.

## 2019-01-29 NOTE — Telephone Encounter (Signed)
Pt calling stating that the medication bisoprolol Dr. Acie Fredrickson prescribed for her is making her worst and she would like a call back concerning this matter [phone]. Please address

## 2019-01-29 NOTE — Telephone Encounter (Signed)
Got and appreciate message from PCP.  Stated that she saw pt and biggest issues seem to be anxiety/depression.  Wanted to know if I took care of the "neuropsychiatric" issues.  I told PCP that I generally refer to psychiatry for this.  There are neuropsychologists in town, but they only do testing usually and pt has refused counseling.  PCP felt pt may need appointment here as has not been seen since Jan.  Noted that pt had appointment last month and they cancelled and said that they would call back to r/s and have not done that.  Hinton Dyer, please call the patient and find out if would like to r/s appointment or not.  If so, can see her on 8/4 if they would like.  Do tell them though, that I will defer to PCP and/or psychiatry for mood symptoms.

## 2019-01-29 NOTE — Telephone Encounter (Signed)
Pt is sch for 02-20-19

## 2019-01-31 ENCOUNTER — Other Ambulatory Visit: Payer: Self-pay

## 2019-01-31 ENCOUNTER — Other Ambulatory Visit: Payer: Medicare Other

## 2019-01-31 DIAGNOSIS — R739 Hyperglycemia, unspecified: Secondary | ICD-10-CM

## 2019-01-31 DIAGNOSIS — G4700 Insomnia, unspecified: Secondary | ICD-10-CM

## 2019-01-31 DIAGNOSIS — E785 Hyperlipidemia, unspecified: Secondary | ICD-10-CM | POA: Diagnosis not present

## 2019-01-31 DIAGNOSIS — M8580 Other specified disorders of bone density and structure, unspecified site: Secondary | ICD-10-CM | POA: Diagnosis not present

## 2019-01-31 DIAGNOSIS — I712 Thoracic aortic aneurysm, without rupture, unspecified: Secondary | ICD-10-CM

## 2019-01-31 DIAGNOSIS — I10 Essential (primary) hypertension: Secondary | ICD-10-CM

## 2019-01-31 DIAGNOSIS — E2839 Other primary ovarian failure: Secondary | ICD-10-CM

## 2019-01-31 DIAGNOSIS — R5383 Other fatigue: Secondary | ICD-10-CM

## 2019-02-01 LAB — COMPREHENSIVE METABOLIC PANEL
ALT: 8 IU/L (ref 0–32)
AST: 16 IU/L (ref 0–40)
Albumin/Globulin Ratio: 2.1 (ref 1.2–2.2)
Albumin: 4.4 g/dL (ref 3.7–4.7)
Alkaline Phosphatase: 68 IU/L (ref 39–117)
BUN/Creatinine Ratio: 23 (ref 12–28)
BUN: 18 mg/dL (ref 8–27)
Bilirubin Total: 0.4 mg/dL (ref 0.0–1.2)
CO2: 25 mmol/L (ref 20–29)
Calcium: 9.2 mg/dL (ref 8.7–10.3)
Chloride: 101 mmol/L (ref 96–106)
Creatinine, Ser: 0.78 mg/dL (ref 0.57–1.00)
GFR calc Af Amer: 83 mL/min/{1.73_m2} (ref >59)
GFR calc non Af Amer: 72 mL/min/{1.73_m2} (ref >59)
Globulin, Total: 2.1 g/dL (ref 1.5–4.5)
Glucose: 92 mg/dL (ref 65–99)
Potassium: 3.8 mmol/L (ref 3.5–5.2)
Sodium: 142 mmol/L (ref 134–144)
Total Protein: 6.5 g/dL (ref 6.0–8.5)

## 2019-02-01 LAB — CBC WITH DIFFERENTIAL/PLATELET
Basophils Absolute: 0 10*3/uL (ref 0.0–0.2)
Basos: 0 %
EOS (ABSOLUTE): 0.1 10*3/uL (ref 0.0–0.4)
Eos: 1 %
Hematocrit: 35.5 % (ref 34.0–46.6)
Hemoglobin: 12.1 g/dL (ref 11.1–15.9)
Immature Grans (Abs): 0 10*3/uL (ref 0.0–0.1)
Immature Granulocytes: 0 %
Lymphocytes Absolute: 1.6 10*3/uL (ref 0.7–3.1)
Lymphs: 25 %
MCH: 32.2 pg (ref 26.6–33.0)
MCHC: 34.1 g/dL (ref 31.5–35.7)
MCV: 94 fL (ref 79–97)
Monocytes Absolute: 0.4 10*3/uL (ref 0.1–0.9)
Monocytes: 7 %
Neutrophils Absolute: 4.1 10*3/uL (ref 1.4–7.0)
Neutrophils: 67 %
Platelets: 289 10*3/uL (ref 150–450)
RBC: 3.76 x10E6/uL — ABNORMAL LOW (ref 3.77–5.28)
RDW: 12.7 % (ref 11.7–15.4)
WBC: 6.3 10*3/uL (ref 3.4–10.8)

## 2019-02-01 LAB — LIPID PANEL
Chol/HDL Ratio: 2.4 ratio (ref 0.0–4.4)
Cholesterol, Total: 171 mg/dL (ref 100–199)
HDL: 72 mg/dL (ref >39)
LDL Calculated: 88 mg/dL (ref 0–99)
Triglycerides: 56 mg/dL (ref 0–149)
VLDL Cholesterol Cal: 11 mg/dL (ref 5–40)

## 2019-02-01 LAB — VITAMIN D 25 HYDROXY (VIT D DEFICIENCY, FRACTURES): Vit D, 25-Hydroxy: 28.4 ng/mL — ABNORMAL LOW (ref 30.0–100.0)

## 2019-02-01 LAB — VITAMIN B12: Vitamin B-12: 1579 pg/mL — ABNORMAL HIGH (ref 232–1245)

## 2019-02-01 LAB — HEMOGLOBIN A1C
Est. average glucose Bld gHb Est-mCnc: 108 mg/dL
Hgb A1c MFr Bld: 5.4 % (ref 4.8–5.6)

## 2019-02-01 LAB — T4, FREE: Free T4: 1.41 ng/dL (ref 0.82–1.77)

## 2019-02-01 LAB — TSH: TSH: 2.42 u[IU]/mL (ref 0.450–4.500)

## 2019-02-06 NOTE — Progress Notes (Signed)
Elizabeth Mccullough was seen today in the movement disorders clinic for neurologic consultation at the request of Mellody Dance, DO.  The consultation is for the evaluation of tremor.  Prior medical records were reviewed.  She is accompanied by her friend who supplements the hx.  The pt noted that she began to have tremor about a year ago.  It has increased over the year.  I noted in med records that the pt went to the ED on 04/10/13 after a fall.  Her sandal had caught on the sidewalk and she had to have stitches in her finger.  07/05/13 update:  The patient is following up today regarding her Parkinson's disease, which was just diagnosed last visit, in October 2014.  I started her on levodopa and referred her to the Parkinson's program.  The pt reports that she is taking OTC sleep medication which is helping.  She initially had nausea with the carbidopa/levodopa but is doing much better.  She really isn't sure if the nausea was from the carbidopa/levodopa 25/100 or from the antidepressant she had started but she feels good now.  She does state that she fell once since last visit.  She says that she just "wasn't looking down" and tripped walking around a car door. She insists that she is doing markedly better.  She started driving again.  "I feel almost normal."  She was supposed to go to physical therapy since our last visit, but admits that she had the initial evaluation and 2 subsequent visits, but this was when she was very nauseated and just could not tolerate the therapy.  She really is not doing any outside exercise. I did review records from other physicians since her last visit.  She had a lower extremity venous Doppler that did not demonstrate DVT but did demonstrate a rather extensive superficial thrombus that extended from above the knee to almost the ankle.  11/06/13 update:  Pt is on carbidopa/levodopa 25/100 three times per day.  She is feeling better.  No hallucinations.  No falls.  "I can  walk, I can write, my mood is good."  She is tolerating the effexor well.  She is not exercising.    05/06/14 update:  Pt is f/u today re: PD. She is on carbidopa/levodopa 25/100 tid (7am/12pm/5pm).  She is on effexor for her mood.  She is feeling well.  She reports a few falls since last visit.  One was at her sons house.  She was going up the stairs and was carrying food and fell on her knees.  She had one other minor fall.  No hallucinations.  No lightheadedness or near syncope.  She is not exercising.  She notes that she has some trouble raising the R arm.   02/20/15 update:  The patient is following up today.  I have not seen her since October, 2015.  She had an appointment in April that she canceled because she reported that she was moving.  She is currently on carbidopa/levodopa 25/100, one tablet 3 times per day.   She is tolerating that well.  She remains on Effexor for anxiety and depression.  I reviewed records available to me since last visit.  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  She does not need a follow-up with them for another year and a half.  She is not exercising faithfully.  She does have some tremor of the L hand if nervous or if she is trying to put  on eyebrow pencil.  Wearing off:  No.  How long before next dose:  n/a Falls:   Yes.  , last week; fell over a pillow on the floor; did not get hurt N/V:  No. Hallucinations:  No.  visual distortions: No. Lightheaded:  No.  Syncope: No. Dyskinesia:  No.  11/14/15 update:  The patient is following up today.  I have not seen her since August, 2016.  She has a history of Parkinson's disease and is on carbidopa/levodopa 25/100, one tablet 3 times per day.  More tremor when she drives.  No lightheadedness.  No hallucinations.  She does have a history of depression and is on Effexor.  At our last visit, she reported that she was off of benzodiazepines, but states today that she is back on Xanax.  States that she really isn't taking that.  States that she only takes it if she goes on plane, etc. I did have the opportunity to review records since last visit.  She went to the emergency room on January 12 after having a fall.  She was attempting to get on the bed and fell and hit her head.  This did require stitches.  States that she had about 3 falls in total.    05/03/16 update:  The patient follows up today.  She has history of Parkinson's disease and last visit I increased her carbidopa/levodopa 25/100, so that she was taking one tablet 4 times a day instead of one tablet 3 times per day.  I asked her to take it at 7 AM/11 AM/3 PM/7 PM.  She states that about 30% of the time she takes it qid and about 70% of the time she takes it qid.  She had a fall about 2 months ago.  She was walking down her granddaughters patio and she fell on the L knee.  She is walking the track some for exercise but mostly is doing knee/leg stretches.  She denies lightheadedness or near syncope.  She denies hallucinations.  Has occasional nausea.    11/26/16 update:  Patient seen today in follow-up.  I have not seen her in about 7 months.  She is prescribed carbidopa/levodopa 25/100, one tablet 4 times per day but generally only takes it tid.  States "everything is getting harder for me to do."  She has had "lots and lots" of falls since our last visit.  States that sometimes the tip of her shoe will get caught on her rug and she will fall.  She denies lightheadedness or near syncope.  She denies hallucinations.  No significant exercise (walks to mailbox, etc).    05/30/17 update: Patient seen today in follow-up for her Parkinson's disease. She is accompanied by her daughter who supplements the history.   The patient is prescribed carbidopa/levodopa 25/100, 1 tablet at 7 AM/11 AM/3 PM/7 PM.  She still has trouble taking it and patient states that "I try to take it morning, noon, night."  Daughter states that she isn't taking it  as directed.  Daughter wonders if she has PD.  States that patients husband had PSP and her daughter wonders if she didn't take on the sx's of her husband.  Daughter asks about marijuana and if I can prescribe in a pill. She was in the hospital in August.  She had a heart cath that demonstrated normal coronary arteries.  She does have depressed LV function.  12/05/17 update:   Pt seen in f/u for PD.  She is on  carbidopa/levodopa 25/100, 2/1/1/.  She reports that she is doing well.  She has had falls, but usually from tripping over something.  No hallucinations.  Biggest c/o is dizziness.  The records that were made available to me were reviewed.  She saw cardiology in January.  She was reporting fatigue.  He thought it could be due to her carvedilol, but wanted to check an echocardiogram before decreasing it.  Echocardiogram was completed and her left ventricular ejection fraction had not changed and he did not think her persistent fatigue was related to cardiac function.  She did see her primary care physician just last week about fatigue.  It was felt likely from medication per pt.  She states that she f/u with PCP and told likely likely from meds.   "I was hoping to get xanax from my PCP but I'm not sure that she called it in."  Not driving due to dizziness.  06/21/18 update:  Pt seen in f/u for PD.   Patient is on carbidopa/levodopa 25/100, 2 tablets in the morning, 1 in the afternoon and 1 evening.   No hallucinations.  Last visit, she was significantly orthostatic in the office.  I talked to her other physicians, and we recommended holding losartan to see if that would help.  Records are reviewed since last visit.  She called the cardiologist on June 18 because she had been feeling so tired since starting Coreg.  They told her to stop the medication.  Of note is that she did see thoracic surgery because of her ascending aneurysm, with low risk of dissection.  They do want her blood pressure under good  control but recognize that her medications were being weaned to because of orthostasis.  Patient was in the emergency room on April 27, 2018 after a fall.  She lost her footing going down the stairs and fell on her face.  There is no loss of consciousness.  CT of brain was unremarkable with the exception of a forehead hematoma.  She did have a fracture of the volar plate of the middle finger.  She had 2 after this that she did not seek care for.  The last was 11/22.  She went face first on the cement.  States that she was walking to a car in the dark and fell.  She c/o nausea but then when asked about it she denies it and said its head pressure.  She however denies headache.  Head pressure is bilateral temporal.  States that has been going on for months.  Asks me for xanax.    08/03/18 update: Patient is seen today in follow-up for Parkinson's disease.   Patient is on carbidopa/levodopa 25/100, 2 tablets in the morning, 1 in the afternoon and 1 in the evening.  However, I changed the timing of her dosing, because she was previously waking up in the middle of the night and starting the dosages then.  I told her not to start the dosages until 7 AM, when she woke up for the day.  I sent a referral for physical therapy last visit, although it was not convinced that she was actually going to complete it.  She reports today that one company came to the house twice but then had to change companies because of insurance so she is expecting them to come 1/31.  She did have a head CT after our last visit that was nonacute.  Her mood has been the same.  I encouraged her last visit  to get a Social worker.  She has not done that.  She complained to her cardiology PA about her anxiety as well, and even stopped her carvedilol hoping that it would help with the anxiety.  It did not, and they restarted. She states that she didn't start it yet. I told her to follow back up with primary care but she hasn't done that yet.  She states  that she has a new NP and doesn't think the effexor worked.  She really only wants Xanax, which has been denied by her primary care as well as myself.  02/20/19 update: Patient seen today in follow-up for Parkinson's disease.  I actually worked her in today.  She had an appointment last month, but canceled the appointment and did not reschedule it.  Patient's primary care physician contacted me and I spoke with her, and appreciate the contact.  She felt that the patient needed a follow-up.  Apparently, there was some concern about her diagnoses by the family.  Patient is currently on carbidopa/levodopa 25/100, 2 tablets in the morning (but only took 1 this morning), 1 in the afternoon and 1 in the evening.  States that she is shaky.  States that she is tired.  Denies n/v.  She reports no falls since our last visit.  No lightheadedness or near syncope.  States that she does not like to leave the house to go to dinner or anywhere else, but has trouble telling me why she does not like to leave the house.  When asked if it is because of mood, she states that "I am just tired and shaky."  Unfortunately, she has had increased stress since our last visit.  1 of her son-in-law's has died since our last visit (suicide).  "the effexor is not working because I'm shaking."  Asked her multiple times about mood and she said "i'm tired by 8pm."  She cannot say if she is depressed or anxious when asked specifically.  Denies suicidal or homicidal ideation, but does state that it has been difficult dealing with the recent death of her son-in-law.  She is happy that her daughter is adjusting well after her husband's death.  Medical records have been reviewed since our last visit.  PREVIOUS MEDICATIONS: none to date  ALLERGIES:   Allergies  Allergen Reactions  . Iohexol Rash     Code: RASH, Desc: PATIENT STATES SHE IS ALLERGIC TO IV DYE. 20 YRS AGO SHE HAD A REACTION AT [facility], PT WAS GIVEN BENADRYL. 07/13/06/RM, Onset  Date: [id number]   . Penicillins Hives    CURRENT MEDICATIONS:  Current Outpatient Medications on File Prior to Visit  Medication Sig Dispense Refill  . aspirin EC 81 MG tablet Take 1 tablet (81 mg total) by mouth daily.    . bisoprolol (ZEBETA) 5 MG tablet Take 0.5 tablets (2.5 mg total) by mouth daily. 45 tablet 3  . diphenhydrAMINE (SLEEP AID, DIPHENHYDRAMINE,) 25 MG tablet Take 25 mg by mouth at bedtime as needed for sleep.    . furosemide (LASIX) 20 MG tablet Take 1 tablet (20 mg total) by mouth daily. 90 tablet 1  . losartan (COZAAR) 25 MG tablet Take 1 tablet (25 mg total) by mouth daily. 90 tablet 3  . lovastatin (MEVACOR) 20 MG tablet TAKE 1 TABLET BY MOUTH AT BEDTIME. 90 tablet 1  . Melatonin 3 MG CAPS Take 3-10 mg by mouth as needed. OTC    . Multiple Vitamin (MULTIVITAMIN) tablet Take 1 tablet by mouth  daily.    . potassium chloride (K-DUR) 10 MEQ tablet TAKE 1 TABLET BY MOUTH DAILY. 90 tablet 2  . venlafaxine XR (EFFEXOR-XR) 150 MG 24 hr capsule TAKE 1 CAPSULE BY MOUTH DAILY WITH BREAKFAST. 90 capsule 1   No current facility-administered medications on file prior to visit.     PAST MEDICAL HISTORY:   Past Medical History:  Diagnosis Date  . AAA (abdominal aortic aneurysm) ([place])   . Carotid artery disease ([place]) 01/12/2017   Carotid US 6/18: Bilat < 50%; repeat in 12/2000  . Chronic combined systolic and diastolic CHF (congestive heart failure) ([place]) 03/01/2017   NICM // [place] 8/18: normal coronary arteries /  Echo 8/18: EF 20-25, mod AI, mod MR, PASP 32 // Echo 1/19: EF 55-60, no RWMA, Gr 1 DD, mod AI, mild MR, PASP 32  . Hyperlipidemia   . Parkinson's disease ([place])     PAST SURGICAL HISTORY:   Past Surgical History:  Procedure Laterality Date  . BREAST CYST ASPIRATION  1965   Right Breast  . HAMMER TOE SURGERY  04/10/02   Left Toe  . KNEE SURGERY Right 10/17/15   meniscus repair  . NASAL SEPTUM SURGERY  1980  . RIGHT/LEFT HEART CATH AND CORONARY ANGIOGRAPHY N/A  03/03/2017   Procedure: RIGHT/LEFT HEART CATH AND CORONARY ANGIOGRAPHY;  Surgeon: Nelva Bush, MD;  Location: [facility];  Service: Cardiovascular;  Laterality: N/A;  . THORACIC AORTOGRAM N/A 03/03/2017   Procedure: Thoracic Aortogram;  Surgeon: Nelva Bush, MD;  Location: [facility];  Service: Cardiovascular;  Laterality: N/A;    SOCIAL HISTORY:   Social History   Socioeconomic History  . Marital status: Widowed    Spouse name: Not on file  . Number of children: 4  . Years of education: Not on file  . Highest education level: 12th grade  Occupational History  . Occupation: retired  Scientific laboratory technician  . Financial resource strain: Not on file  . Food insecurity    Worry: Not on file    Inability: Not on file  . Transportation needs    Medical: Not on file    Non-medical: Not on file  Tobacco Use  . Smoking status: Never Smoker  . Smokeless tobacco: Never Used  Substance and Sexual Activity  . Alcohol use: Yes    Comment: social wine   . Drug use: No  . Sexual activity: Not Currently    Partners: Male  Lifestyle  . Physical activity    Days per week: Not on file    Minutes per session: Not on file  . Stress: Not on file  Relationships  . Social Herbalist on phone: Not on file    Gets together: Not on file    Attends religious service: Not on file    Active member of club or organization: Not on file    Attends meetings of clubs or organizations: Not on file    Relationship status: Not on file  . Intimate partner violence    Fear of current or ex partner: Not on file    Emotionally abused: Not on file    Physically abused: Not on file    Forced sexual activity: Not on file  Other Topics Concern  . Not on file  Social History Narrative   Exercise-- no   Pt lives with daughter [name] at home    FAMILY HISTORY:   Family Status  Relation Name Status  . Mother  Deceased at age 544       chf  . Father  Deceased at age 48        addisons  . Sister  Alive       15  . Brother  Deceased at age birth  . Sister  Alive       70  . Sister  Deceased  . Sister  Deceased at age 38  . Brother  Deceased  . Brother  Deceased at age 72       bleeding ulcer, heart  . Brother  Deceased at age 54       pneumonia  . Other  (Not Specified)  . MGM  Deceased  . MGF  Deceased  . PGM  Deceased  . PGF  Deceased  . Son [employer]  . Daughter Environmental consultant  . Daughter [employer]  . Daughter Juliann Pulse Alive    ROS:  Review of Systems  Constitutional: Negative.   HENT: Negative.   Eyes: Negative.   Respiratory: Negative.   Cardiovascular: Negative.   Gastrointestinal: Negative.   Genitourinary: Negative.   Skin: Negative.       PHYSICAL EXAMINATION:    VITALS:   Vitals:   02/20/19 1018  BP: 112/64  Pulse: 79  Temp: 98.4 F (36.9 C)  SpO2: 97%  Weight: 132 lb (59.9 kg)  Height: 5\' 1"  (1.549 m)   Wt Readings from Last 3 Encounters:  02/20/19 132 lb (59.9 kg)  01/25/19 134 lb 6.4 oz (61 kg)  01/23/19 134 lb (60.8 kg)     GEN:  The patient appears stated age and is in NAD. HEENT:  Normocephalic, atraumatic.  The mucous membranes are moist. The superficial temporal arteries are without ropiness or tenderness. CV:  RRR Lungs: Clear to auscultation bilaterally Neck/HEME: No bruits  Neurological examination:  Orientation: MMSE done personally by me MMSE - Mini Mental State Exam 02/20/2019  Orientation to time 5  Orientation to Place 5  Registration 3  Attention/ Calculation 5  Recall 3  Language- name 2 objects 2  Language- repeat 1  Language- follow 3 step command 3  Language- read & follow direction 1  Write a sentence 1  Copy design 1  Total score 30   Cranial nerves: There is good facial symmetry. The speech is fluent and clear. Soft palate rises symmetrically and there is no tongue deviation. Hearing is intact to conversational tone. Sensation: Sensation is intact to light touch throughout Motor:  Strength is 5/5 in the bilateral upper and lower extremities.   Shoulder shrug is equal and symmetric.  There is no pronator drift.   Movement examination: Tone: There is mild to mod increased tone in the RUE and very mild in the LUE (only took 1 carbidopa/levodopa instead of 2 this AM) Abnormal movements: There is mild to mod RUE resting tremor (same as previous visit). Coordination:  There is mild decremation, with any form of RAMS, including alternating supination and pronation of the forearm, hand opening and closing, finger taps, heel taps and toe taps bilaterally (same as prior visit). Gait and Station: The patient pushes off of a chair to arise.  She walks well down the hall.  She was stable.  Lab Results  Component Value Date   TSH 2.420 01/31/2019   Lab Results  Component Value Date   VITAMINB12 1,579 (H) 01/31/2019     Chemistry      Component Value Date/Time   NA 142 01/31/2019 0913  K 3.8 01/31/2019 0913   CL 101 01/31/2019 0913   CO2 25 01/31/2019 0913   BUN 18 01/31/2019 0913   CREATININE 0.78 01/31/2019 0913   CREATININE 0.79 01/03/2015 1226      Component Value Date/Time   CALCIUM 9.2 01/31/2019 0913   ALKPHOS 68 01/31/2019 0913   AST 16 01/31/2019 0913   ALT 8 01/31/2019 0913   BILITOT 0.4 01/31/2019 0913       ASSESSMENT/PLAN:  1.  Parkinson's disease, diagnosed in 04/20/2013.  -d/c carbidopa/levodopa 25/100 IR, although I am not convinced that the fatigue is from this  -start carbidopa/levodopa 25/100 CR, 2 in the AM, 2 in the afternoon, 1 in the evening 2.  Anxiety and depression  -Pt and I had long discussion regarding need for counseling and psychiatry.  She is refusing.  I asked her to think about it.  She initially stated she was trying to avoid going to more physician appointments.  I told her that counseling is often online, at which point she stated that she would need help getting online.  She did state that her daughter could help her, but did  not want me to make that appointment.  She did ask my medical assistant afterwards about how she would go about getting an appointment, and we told her we could refer her, but she stated that she would call back if she wanted the referral. 3.  Hx cardiomyopathy  -Patient is following with cardiology.  Her cardiologist has changed her from carvedilol to bisoprolol  -she asks me today about her potassium dosage and I told her that this was out of my field of expertise and that she will need to ask her prescribing physician. 4.  Follow up is anticipated in the next 4-6 months, sooner should new neurologic issues arise.  Much greater than 50% of this visit was spent in counseling and coordinating care.  Total face to face time:  25 min

## 2019-02-20 ENCOUNTER — Other Ambulatory Visit: Payer: Self-pay

## 2019-02-20 ENCOUNTER — Telehealth: Payer: Self-pay | Admitting: Family Medicine

## 2019-02-20 ENCOUNTER — Telehealth: Payer: Self-pay | Admitting: Neurology

## 2019-02-20 ENCOUNTER — Encounter: Payer: Self-pay | Admitting: Neurology

## 2019-02-20 ENCOUNTER — Ambulatory Visit: Payer: Medicare Other | Admitting: Neurology

## 2019-02-20 VITALS — BP 112/64 | HR 79 | Temp 98.4°F | Ht 61.0 in | Wt 132.0 lb

## 2019-02-20 DIAGNOSIS — F330 Major depressive disorder, recurrent, mild: Secondary | ICD-10-CM | POA: Diagnosis not present

## 2019-02-20 DIAGNOSIS — G20 Parkinson's disease: Secondary | ICD-10-CM | POA: Diagnosis not present

## 2019-02-20 MED ORDER — CARBIDOPA-LEVODOPA ER 25-100 MG PO TBCR: EXTENDED_RELEASE_TABLET | ORAL | 1 refills | Status: DC

## 2019-02-20 NOTE — Patient Instructions (Signed)
1.  Stop carbidopa/levodopa 25/100 IR (the yellow tablet) 2.  Start carbidopa/levodopa 25/100 CR, 2 tablets at 7am, 2 at 11am, 1 at 4pm 3.  I would like you to consider psychiatry and a counselor.  Many of the counseling services are online now, if that would be easier for you.

## 2019-02-20 NOTE — Progress Notes (Signed)
Per Dr. Raliegh Scarlet placed referral for neurology as patient's family requests a referral for a new office. MPulliam, CMA/RT(R)

## 2019-02-20 NOTE — Telephone Encounter (Signed)
Note to document follow up to todays visit:  Front staff let me know following visit that pts daughter very upset that she was not allowed back to visit due to [place] Restrictions currently in place due to coronavirus (pt told this upon making appt and noted on appointment line).  Front staff let daughter know that this was a Museum/gallery conservator.  Daughter called primary care office while in the office and primary care medical assistant apparently called the front staff and stated that the daughter had to come back because the patients daughter stated that patient had memory change.  MMSE was personally done by me during the visit and patient scored 30/30.  Patient has no evidence of dementia, and was able to provide her history very well today.  Upon exiting today, staff tried to make the patient her follow-up appointment and a daughter stated that they would not be back and would not be making a follow-up appointment.  It should be noted that daughter also canceled the patient's June follow-up appointment and did not make a follow-up appointment, and then we had to work her in today because primary care asked [place] to.

## 2019-02-20 NOTE — Telephone Encounter (Signed)
Patient's daughter notified that new referral was placed. MPulliam, CMA/RT(R)

## 2019-02-20 NOTE — Telephone Encounter (Signed)
Patient's daughter was displeased with [facility] and her mom's first visit and is requesting a referral to [facility] for her mother's care. She is also requesting a call back from clinic staff to discuss (pt requested to speak with PCP but advised her the CMA would be the one to contact her).

## 2019-02-22 ENCOUNTER — Telehealth: Payer: Self-pay | Admitting: Neurology

## 2019-02-22 ENCOUNTER — Other Ambulatory Visit: Payer: Self-pay | Admitting: Cardiovascular Disease

## 2019-03-22 ENCOUNTER — Encounter: Payer: Self-pay | Admitting: Neurology

## 2019-03-22 ENCOUNTER — Ambulatory Visit: Payer: Medicare Other | Admitting: Neurology

## 2019-03-22 ENCOUNTER — Other Ambulatory Visit: Payer: Self-pay

## 2019-03-22 VITALS — BP 162/83 | HR 91 | Ht 61.5 in | Wt 133.0 lb

## 2019-03-22 DIAGNOSIS — F418 Other specified anxiety disorders: Secondary | ICD-10-CM

## 2019-03-22 DIAGNOSIS — K5909 Other constipation: Secondary | ICD-10-CM | POA: Diagnosis not present

## 2019-03-22 DIAGNOSIS — G20 Parkinson's disease: Secondary | ICD-10-CM

## 2019-03-22 DIAGNOSIS — Z9181 History of falling: Secondary | ICD-10-CM

## 2019-03-22 NOTE — Progress Notes (Signed)
Subjective:    Patient ID: Elizabeth Mccullough is a 78 y.o. female.  HPI     Star Age, MD, PhD [facility] [address] [place], [place] [address]  Dear Dr. Everette Rank,   I saw your patient, Elizabeth Mccullough, upon your kind request in my neurologic clinic today for initial consultation of her parkinsonism.  The patient is accompanied by her oldest daughter, Juliann Pulse today.  As you know, Ms. Cathell is a 78 year old right-handed woman with an underlying medical history of chronic congestive heart failure, carotid artery disease, AAA, hyperlipidemia and Parkinson's disease, who followed with Dr. Carles Collet for nearly 6 years.  She was most recently seen by Dr. Carles Collet on 02/20/2019.  She had at that time some recent stressors and increase in depressive and anxiety symptoms and felt more shaky.  She had more fatigue.  She was switched from Sinemet IR to Sinemet CR 25-100 mg strength, 2 pills in the morning and midday and 1 at night.  Her MMSE was 30 out of 30 at the time.  She was offered a referral for depression and anxiety to counseling.  She declined at that time and she and her daughter declined any follow-up with Dr. Carles Collet as I understand.  Her symptoms go back to early in 2014 and started with handwriting changes and right hand tremor.  She has fallen over the course of the years, most recent fall was at home a couple of days ago, she was reaching for the light switch and she fell backwards, bumped her left flank area but no serious injury, no head injury thankfully.  She has had some memory loss over time.  She has had more depression and anxiety and does not feel that the Effexor is helping.  She is currently on Effexor long-acting 150 mg daily.  She has not had any recent physical therapy or occupational therapy or speech therapy.  She had PT evaluation about a year ago.  She is not very active physically, she walks to the mailbox maybe once a day.  She likes to  watch TV.  She lives with her other daughter.  She has 3 daughters and 1 son.  She is widowed.  She is a non-smoker and does not drink much in the way of alcohol, maybe once a year or so, caffeine and limitation, 1 or 2 cups of coffee per day, does not hydrate very well with water according to her daughter, estimates that she drinks about 4 cups of water per day.  She is not very strict with her Sinemet schedule, she reports taking 2 pills at 430 or 5 AM, then goes back to sleep and wakes up around 9 or even 10 at times.  She then takes her other medications.  She takes her second dose of Sinemet around 11 and 1 pill around 4.  Sometimes she takes the last pill around 5 PM.  Bedtime is between 9 and 10.  She takes melatonin at night for sleep, 5 mg.  She has also taken Benadryl at night for sleep but daughter indicates that they have stopped doing that.  She has been diagnosed with CHF, she has seen her new cardiologist.  She also has been monitored for AAA. She had a head CT without contrast on 06/21/2018 and I reviewed the results: IMPRESSION: 1. Atrophy and small vessel disease. No acute intracranial findings. 2. No posttraumatic sequelae are observed. 3. Stable appearance from 04/27/2018, aside from involution of  the forehead hematoma. She had a brain MRI with and without contrast on 09/07/2015 and I reviewed the results: IMPRESSION: Atrophy with mild chronic microvascular ischemia. No acute intracranial abnormality. Her daughter indicates that there is a family history of dementia.  Her sister died in her 21s and had dementia.  Her other sister has been diagnosed with vascular dementia as I understand.  Her daughter is wondering if they should pursue a genetic test for Alzheimer's disease in her mom.  Her Past Medical History Is Significant For: Past Medical History:  Diagnosis Date  . AAA (abdominal aortic aneurysm) ([place])   . Carotid artery disease ([place]) 01/12/2017   Carotid US 6/18: Bilat < 50%;  repeat in 12/2000  . Chronic combined systolic and diastolic CHF (congestive heart failure) ([place]) 03/01/2017   NICM // [place] 8/18: normal coronary arteries /  Echo 8/18: EF 20-25, mod AI, mod MR, PASP 32 // Echo 1/19: EF 55-60, no RWMA, Gr 1 DD, mod AI, mild MR, PASP 32  . Hyperlipidemia   . Parkinson's disease ([place])     Her Past Surgical History Is Significant For: Past Surgical History:  Procedure Laterality Date  . BREAST CYST ASPIRATION  1965   Right Breast  . HAMMER TOE SURGERY  04/10/02   Left Toe  . KNEE SURGERY Right 10/17/15   meniscus repair  . NASAL SEPTUM SURGERY  1980  . RIGHT/LEFT HEART CATH AND CORONARY ANGIOGRAPHY N/A 03/03/2017   Procedure: RIGHT/LEFT HEART CATH AND CORONARY ANGIOGRAPHY;  Surgeon: Nelva Bush, MD;  Location: [facility];  Service: Cardiovascular;  Laterality: N/A;  . THORACIC AORTOGRAM N/A 03/03/2017   Procedure: Thoracic Aortogram;  Surgeon: Nelva Bush, MD;  Location: [facility];  Service: Cardiovascular;  Laterality: N/A;    Her Family History Is Significant For: Family History  Problem Relation Age of Onset  . Heart disease Mother   . Hyperlipidemia Sister   . Hyperlipidemia Brother   . Diabetes Sister   . Hyperlipidemia Sister   . Cancer Sister 44       colon  . Cancer Sister        breast  . Stroke Brother   . Hypertension Brother   . Breast cancer Other   . Colon cancer Other   . Prostate cancer Son   . Diabetes Daughter   . Stomach cancer Daughter   . Healthy Daughter     Her Social History Is Significant For: Social History   Socioeconomic History  . Marital status: Widowed    Spouse name: Not on file  . Number of children: 4  . Years of education: Not on file  . Highest education level: 12th grade  Occupational History  . Occupation: retired  Scientific laboratory technician  . Financial resource strain: Not on file  . Food insecurity    Worry: Not on file    Inability: Not on file  . Transportation needs     Medical: Not on file    Non-medical: Not on file  Tobacco Use  . Smoking status: Never Smoker  . Smokeless tobacco: Never Used  Substance and Sexual Activity  . Alcohol use: Yes    Comment: social wine   . Drug use: No  . Sexual activity: Not Currently    Partners: Male  Lifestyle  . Physical activity    Days per week: Not on file    Minutes per session: Not on file  . Stress: Not on file  Relationships  .  Social Herbalist on phone: Not on file    Gets together: Not on file    Attends religious service: Not on file    Active member of club or organization: Not on file    Attends meetings of clubs or organizations: Not on file    Relationship status: Not on file  Other Topics Concern  . Not on file  Social History Narrative   Exercise-- no   Pt lives with daughter [name] at home    Her Allergies Are:  Allergies  Allergen Reactions  . Iohexol Rash     Code: RASH, Desc: PATIENT STATES SHE IS ALLERGIC TO IV DYE. 20 YRS AGO SHE HAD A REACTION AT [facility], PT WAS GIVEN BENADRYL. 07/13/06/RM, Onset Date: [id number]   . Penicillins Hives  :   Her Current Medications Are:  Outpatient Encounter Medications as of 03/22/2019  Medication Sig  . aspirin EC 81 MG tablet Take 1 tablet (81 mg total) by mouth daily.  . bisoprolol (ZEBETA) 5 MG tablet Take 0.5 tablets (2.5 mg total) by mouth daily.  . Carbidopa-Levodopa ER (SINEMET CR) 25-100 MG tablet controlled release 2 in the AM, 2 in the afternoon, 1 in the evening  . diphenhydrAMINE (SLEEP AID, DIPHENHYDRAMINE,) 25 MG tablet Take 25 mg by mouth at bedtime as needed for sleep.  . furosemide (LASIX) 20 MG tablet TAKE 1 TABLET BY MOUTH DAILY.  Marland Kitchen losartan (COZAAR) 25 MG tablet Take 1 tablet (25 mg total) by mouth daily.  Marland Kitchen lovastatin (MEVACOR) 20 MG tablet TAKE 1 TABLET BY MOUTH AT BEDTIME.  . Melatonin 3 MG CAPS Take 3-10 mg by mouth as needed. OTC  . Multiple Vitamin (MULTIVITAMIN) tablet Take 1 tablet by mouth daily.   . potassium chloride (K-DUR) 10 MEQ tablet TAKE 1 TABLET BY MOUTH DAILY.  Marland Kitchen venlafaxine XR (EFFEXOR-XR) 150 MG 24 hr capsule TAKE 1 CAPSULE BY MOUTH DAILY WITH BREAKFAST.   No facility-administered encounter medications on file as of 03/22/2019.   :   Review of Systems:  Out of a complete 14 point review of systems, all are reviewed and negative with the exception of these symptoms as listed below: Review of Systems  Neurological:       Pt presents today to discuss her PD. Pt wants an MRI because she feels the PD is progressing. Pt has siblings with alzheimer's disease and wants a genetic test for that. Pt wants to change her effexor because it does not seem to help her anxiety. Pt is also complaining of anxiety, headaches, fatigue, weakness, SoB, dizziness, and forgetfulness.    Objective:  Neurological Exam  Physical Exam Physical Examination:   Vitals:   03/22/19 1000  BP: (!) 162/83  Pulse: 91    General Examination: The patient is a very pleasant 78 y.o. female in no acute distress. She appears well-developed and well-nourished and well groomed.   HEENT: Normocephalic, atraumatic, pupils are equal, round and reactive to light and accommodation. Extraocular tracking is mildly impaired, no obvious gait limitation.  No nystagmus.  Face is mildly to moderately mass, no obvious facial tremor, moderate nuchal rigidity noted.  Speech is mildly hypophonic, no dysarthria noted.  Hearing is grossly intact.  Airway examination reveals a small airway, moderate mouth dryness, otherwise benign findings, tongue protrudes centrally in palate elevates symmetrically.  Chest: Clear to auscultation without wheezing, rhonchi or crackles noted.  Heart: S1+S2+0, regular and normal ?mild diastolic heart murmur.   Abdomen: Soft, non-tender  and non-distended with normal bowel sounds appreciated on auscultation.  Extremities: There is no pitting edema in the distal lower extremities bilaterally. Pedal  pulses are intact.  Skin: Warm and dry without trophic changes noted. There are Prominent varicose veins in the left calf.  Musculoskeletal: exam reveals Arthritic changes in both hands.   Neurologically:  Mental status: The patient is awake, alert and oriented in all 4 spheres. Her immediate and remote memory, attention, language skills and fund of knowledge are appropriate. There is no evidence of aphasia, agnosia, apraxia or anomia. Speech is clear with normal prosody and enunciation. Thought process is linear. Mood is normal and affect is blunted.  Cranial nerves II - XII are as described above under HEENT exam. In addition: shoulder shrug is normal with equal shoulder height noted. Motor exam: Thin built, normal global strength for age, no obvious drift, she has a mild resting tremor in the right upper extremity, no obvious resting tremor noted otherwise.  She has no significant postural or action tremor.  On fine motor testing, she has moderate difficulty with the right hand with finger taps, rapid alternating padding and hand movements, mild to moderate on the left.  Foot taps are moderately impaired on the right and mild to moderate on the left.  Foot agility is mildly impaired bilaterally Romberg is not test for safety. Reflexes are 1+ throughout. Babinski: Toes are flexor bilaterally.   Sensory exam: intact to light touch in the upper and lower extremities.  Gait, station and balance: She stands up with difficulty and pushes herself up, she requires no assistance, posture is moderately stooped.  She walks with near absence of arm swing on the right, significantly reduced arm swing on the left.  She walks slowly and with decreased stride length.  No shuffling per se, no start had a station or freezing noted.  She has no obvious dyskinesias Cerebellar testing shows no dysmetria or intention tremor.  Balance is mildly impaired. Assessment and Plan:   In summary, DAMYAH YOCHUM is a  very pleasant 78 y.o.-year old female  with an underlying medical history of chronic congestive heart failure, carotid artery disease, AAA, hyperlipidemia and Parkinson's disease, who Presents for evaluation of her Parkinson's disease.  Her history and examination are in keeping with right-sided predominant Parkinson's disease.  She may have had some associated mild memory loss, she does have associated constipation and anxiety and depression.  She is suboptimal with her anxiety and depression control.  She is encouraged to talk to you about it as well but also consider seeking expert input with a psychiatrist.  She is encouraged to think about it.  Furthermore, I would like to suggest she streamline her medication regimen a little bit more and also Work on keeping a more set schedule with her sleep.  She is advised to continue with Sinemet CR at this point.  She is encouraged to take it 3 times a day, 2 pills, namely at 9, 4, and 9.I think she would benefit from evaluation with PT, OT and speech therapy.  I offered a referral to neuro rehab, she declines at this time but is encouraged to think about it.  She is encouraged to incorporate more physical activity in her day-to-day routine.  She is encouraged to consider a recumbent bike. We talked about the importance of good hydration.  She is advised to increase her water intake.  She is strongly discouraged from utilizing Benadryl at night for sleep as it can  cause daytime drowsiness and balance impairment.  She can continue with melatonin as needed. Her daughter and the patient are worried about dementia.  We can certainly do a follow-up MMSE in the near future.  We will proceed with a brain MRI and compare with 2017's findings.  I plan to see her back in 3 months, sooner if needed.  I answered all their questions today and the patient and her daughter Juliann Pulse were in agreement.   Thank you very much for allowing me to participate in the care of this nice patient.  If I can be of any further assistance to you please do not hesitate to call me at [phone].  Sincerely,   Star Age, MD, PhD

## 2019-03-22 NOTE — Patient Instructions (Addendum)
  It was nice to meet you today.  I do agree with Dr. Carles Collet that you have right side of Parkinson's disease.  You may have associated mild memory loss, we can continue to monitor your memory, let us proceed with a brain MRI And compare with your MRI from 2017. Please take Sinemet CR 25-100 mg strength 2 pills 3 times a day, at 9 AM, 4 PM and 9 PM.Please try to stay active physically, a recumbent bike may be a good choice for you.  Please try to stay better hydrated with water, 5 or 6 cups of water should be tolerable.  You can check with your cardiologist as well.  Please stay proactive about constipation issues.  Please add a stool softener or a laxative even as needed. Continue with your other medications, we can consider a referral to a psychiatrist for depression and anxiety management if needed.  Also we should consider a referral to neuro rehab for evaluations with physical therapy, Occupational Therapy and speech therapy. Follow-up in 3 months.

## 2019-03-28 ENCOUNTER — Telehealth: Payer: Self-pay | Admitting: Neurology

## 2019-03-28 NOTE — Telephone Encounter (Signed)
[employer] medicare pending faxed notes 

## 2019-03-31 ENCOUNTER — Other Ambulatory Visit: Payer: Self-pay | Admitting: Cardiovascular Disease

## 2019-04-03 NOTE — Telephone Encounter (Signed)
I was unable to get it approved on my level.. I spoke to the nurse Almyra Free with [employer] she was unable to get it approve on her level. There is an option to do a peer to peer. The phone number is [phone]. The member ID is [id number] & DOB [id number]-1940.Marland Kitchen The case does close on Friday 04/06/19.

## 2019-04-03 NOTE — Telephone Encounter (Signed)
Please advise patient that her MRI was denied by her insurance.  She had a head CT Within the last year and we will continue to monitor her clinically at this point.  We will review the need for an MRI down the road if needed.

## 2019-04-04 ENCOUNTER — Telehealth: Payer: Self-pay | Admitting: Neurology

## 2019-04-04 NOTE — Telephone Encounter (Signed)
Please reiterate that the MRI was denied by the insurance based on my documentation which was extensive.  We will reevaluate the need for the brain MRI down the road and continue to monitor her clinically.  Reassuringly, she had a head CT within the last year which did not show any acute or concerning findings.  If there are any acute symptom changes or complaints, these may require more sImmediate evaluation, even in the emergency room and scans are based on the need at the time.  For now, we can continue to monitor her clinically.

## 2019-04-04 NOTE — Telephone Encounter (Signed)
Pt daughter on DPR is asking for a call from RN to discuss the denial of the MRI

## 2019-04-04 NOTE — Telephone Encounter (Signed)
Pt daughter(on DPR ) is asking if the wording can be phrased differently so the MRI can be approved.  Daughter is asking for a call from RN

## 2019-04-04 NOTE — Telephone Encounter (Signed)
Pt's daughter has already called less than ten minutes ago to discuss this. Please see other telephone note from today.

## 2019-04-04 NOTE — Telephone Encounter (Signed)
I called pt. She answered the phone but the connection seemed poor. She told me to "speak up". She confirmed that she could hear me. I explained that her MRI was denied by her insurance and Dr. Guadelupe Sabin recommendations. There was silence on the line after that and the pt did not answer me back.  I called pt again. She asked me to repeat everything I explained earlier. She confirmed that she will keep her follow up in December. She verbalized understanding that her MRI was denied by her insurance and of Dr. Guadelupe Sabin recommendations.

## 2019-04-05 NOTE — Telephone Encounter (Signed)
I called pt's daughter, [name], per DPR to discuss. No answer, left a message asking her to call me back.

## 2019-04-05 NOTE — Telephone Encounter (Signed)
Pt's daughter [name], per DPR returned my call and I explained the recommendations regarding the MRI denial to her. Pt's daughter is worried about pt's depression. She wanted to know a psychiatrist's name locally, I recommended Dr. Casimiro Needle and she will discuss a psychiatry referral more with pt's PCP. Pt's daughter verbalized understanding of the recommendations and had no further questions at this time.

## 2019-05-21 ENCOUNTER — Other Ambulatory Visit: Payer: Self-pay | Admitting: Family Medicine

## 2019-05-21 DIAGNOSIS — F411 Generalized anxiety disorder: Secondary | ICD-10-CM

## 2019-05-21 DIAGNOSIS — F329 Major depressive disorder, single episode, unspecified: Secondary | ICD-10-CM

## 2019-05-21 DIAGNOSIS — F32A Depression, unspecified: Secondary | ICD-10-CM

## 2019-06-11 ENCOUNTER — Other Ambulatory Visit: Payer: Self-pay | Admitting: Family Medicine

## 2019-06-11 DIAGNOSIS — E785 Hyperlipidemia, unspecified: Secondary | ICD-10-CM

## 2019-06-12 ENCOUNTER — Other Ambulatory Visit: Payer: Self-pay | Admitting: Family Medicine

## 2019-06-12 DIAGNOSIS — F32A Depression, unspecified: Secondary | ICD-10-CM

## 2019-06-12 DIAGNOSIS — E785 Hyperlipidemia, unspecified: Secondary | ICD-10-CM

## 2019-06-12 DIAGNOSIS — F329 Major depressive disorder, single episode, unspecified: Secondary | ICD-10-CM

## 2019-06-12 DIAGNOSIS — F411 Generalized anxiety disorder: Secondary | ICD-10-CM

## 2019-06-26 ENCOUNTER — Ambulatory Visit: Payer: Medicare Other | Admitting: Neurology

## 2019-06-26 ENCOUNTER — Encounter: Payer: Self-pay | Admitting: Neurology

## 2019-06-26 ENCOUNTER — Other Ambulatory Visit: Payer: Self-pay

## 2019-06-26 VITALS — BP 154/80 | HR 79 | Ht 62.0 in | Wt 134.0 lb

## 2019-06-26 DIAGNOSIS — K5909 Other constipation: Secondary | ICD-10-CM | POA: Diagnosis not present

## 2019-06-26 DIAGNOSIS — Z9181 History of falling: Secondary | ICD-10-CM | POA: Diagnosis not present

## 2019-06-26 DIAGNOSIS — G20 Parkinson's disease: Secondary | ICD-10-CM

## 2019-06-26 DIAGNOSIS — F418 Other specified anxiety disorders: Secondary | ICD-10-CM | POA: Diagnosis not present

## 2019-06-26 DIAGNOSIS — G20A1 Parkinson's disease without dyskinesia, without mention of fluctuations: Secondary | ICD-10-CM

## 2019-06-26 NOTE — Patient Instructions (Signed)
As discussed during our appointment in September, I recommend you take Sinemet CR 25-100 mg strength 2 pills 3 times a day, at 9 AM, 4 PM and 9 PM.Please try to stay active physically, please use your walker at all times, as you have fallen.  If you would like to taper off the Effexor, please talk to your primary care physician about this, or alternative treatment options for depression and anxiety.   As discussed, we can consider a referral to neuro rehab for evaluations with physical therapy, Occupational Therapy and speech therapy. We can also initiate home health physical therapy if you would prefer, please talk to your family about it and you can call us to let us know, I can make a referral to your home health agency from before. Please follow-up to see the Nurse practitioner in 4 months.

## 2019-06-26 NOTE — Progress Notes (Signed)
Subjective:    Patient ID: Elizabeth Mccullough is a 78 y.o. female.  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  The patient is unaccompanied today.  I first met Elizabeth Mccullough on 03/22/2019 at the request of Elizabeth Mccullough primary care physician, at which time the patient requested to change providers, she had been seeing Dr. Carles Collet for several years.  She was on Sinemet CR at the time, and I advised Elizabeth Mccullough to take 2 pills 3 times daily.  She switched from Sinemet IR to Sinemet CR not too long ago.  We talked about the importance of healthy lifestyle and physical activity, we considered referral to neuro rehab.    Today, 06/26/2019: She reports that Elizabeth Mccullough Parkinson symptoms have become worse, Elizabeth Mccullough tremor is worse.  She reports having fallen twice since Elizabeth Mccullough appointment with me in September, thankfully no injuries.  She tends to walk without a cane or walker.  She has 2 canes available and a walker.  She currently is not in therapy but has had some physical therapy at home.  She declines a referral to neuro rehab, does not wish to pursue outpatient therapy at this time.  She would be open to home health physical therapy but indicates that Elizabeth Mccullough daughter did not like a stranger in the house.  She is willing to talk to Elizabeth Mccullough daughter about home health physical therapy again.  She is taking Sinemet CR 2 pills in the morning and 2 pills midday, 1 pill at night, for some reason she did not increase it to 2 pills 3 times daily as discussed in September. She has been on Effexor per Elizabeth Mccullough PCP, she would like to come off of it, feels like it is making Elizabeth Mccullough nervous.  The patient's allergies, current medications, family history, past medical history, past social  history, past surgical history and problem list were reviewed and updated as appropriate.   Previously:   03/22/2019: (She) followed with Dr. Carles Collet for nearly 6 years.  She was most recently seen by Dr. Carles Collet on 02/20/2019.  She had at that time some recent stressors and increase in depressive and anxiety symptoms and felt more shaky.  She had more fatigue.  She was switched from Sinemet IR to Sinemet CR 25-100 mg strength, 2 pills in the morning and midday and 1 at night.  Elizabeth Mccullough MMSE was 30 out of 30 at the time.  She was offered a referral for depression and anxiety to counseling.  She declined at that time and she and Elizabeth Mccullough daughter declined any follow-up with Dr. Carles Collet as I understand.  Elizabeth Mccullough symptoms go back to early in 2014 and started with handwriting changes and right hand tremor.  She has fallen over the course of the years, most recent fall was at home a couple of days ago, she was reaching for the light switch and she fell backwards, bumped Elizabeth Mccullough left flank area but no serious injury, no head injury thankfully.  She has had some memory loss over time.  She has had more depression and anxiety and does not feel that the Effexor is helping.  She is currently on Effexor long-acting 150 mg daily.  She has not had any recent physical therapy or occupational therapy or speech therapy.  She had PT evaluation  about a year ago.  She is not very active physically, she walks to the mailbox maybe once a day.  She likes to watch TV.  She lives with Elizabeth Mccullough other daughter.  She has 3 daughters and 1 son.  She is widowed.  She is a non-smoker and does not drink much in the way of alcohol, maybe once a year or so, caffeine and limitation, 1 or 2 cups of coffee per day, does not hydrate very well with water according to Elizabeth Mccullough daughter, estimates that she drinks about 4 cups of water per day.  She is not very strict with Elizabeth Mccullough Sinemet schedule, she reports taking 2 pills at 430 or 5 AM, then goes back to sleep and wakes up around 9 or even 10 at  times.  She then takes Elizabeth Mccullough other medications.  She takes Elizabeth Mccullough second dose of Sinemet around 11 and 1 pill around 4.  Sometimes she takes the last pill around 5 PM.  Bedtime is between 9 and 10.  She takes melatonin at night for sleep, 5 mg.  She has also taken Benadryl at night for sleep but daughter indicates that they have stopped doing that.  She has been diagnosed with CHF, she has seen Elizabeth Mccullough new cardiologist.  She also has been monitored for AAA. She had a head CT without contrast on 06/21/2018 and I reviewed the results: IMPRESSION: 1. Atrophy and small vessel disease. No acute intracranial findings. 2. No posttraumatic sequelae are observed. 3. Stable appearance from 04/27/2018, aside from involution of the forehead hematoma. She had a brain MRI with and without contrast on 09/07/2015 and I reviewed the results: IMPRESSION: Atrophy with mild chronic microvascular ischemia. No acute intracranial abnormality. Elizabeth Mccullough daughter indicates that there is a family history of dementia.  Elizabeth Mccullough sister died in Elizabeth Mccullough 67s and had dementia.  Elizabeth Mccullough other sister has been diagnosed with vascular dementia as I understand.  Elizabeth Mccullough daughter is wondering if they should pursue a genetic test for Alzheimer's disease in Elizabeth Mccullough mom.  Elizabeth Mccullough Past Medical History Is Significant For: Past Medical History:  Diagnosis Date  . AAA (abdominal aortic aneurysm) ([place])   . Carotid artery disease ([place]) 01/12/2017   Carotid US 6/18: Bilat < 50%; repeat in 12/2000  . Chronic combined systolic and diastolic CHF (congestive heart failure) ([place]) 03/01/2017   NICM // [facility] 8/18: normal coronary arteries /  Echo 8/18: EF 20-25, mod AI, mod MR, PASP 32 // Echo 1/19: EF 55-60, no RWMA, Gr 1 DD, mod AI, mild MR, PASP 32  . Hyperlipidemia   . Parkinson's disease ([place])     Elizabeth Mccullough Past Surgical History Is Significant For: Past Surgical History:  Procedure Laterality Date  . BREAST CYST ASPIRATION  1965   Right Breast  . HAMMER TOE SURGERY  04/10/02   Left Toe  .  KNEE SURGERY Right 10/17/15   meniscus repair  . NASAL SEPTUM SURGERY  1980  . RIGHT/LEFT HEART CATH AND CORONARY ANGIOGRAPHY N/A 03/03/2017   Procedure: RIGHT/LEFT HEART CATH AND CORONARY ANGIOGRAPHY;  Surgeon: Nelva Bush, MD;  Location: [facility];  Service: Cardiovascular;  Laterality: N/A;  . THORACIC AORTOGRAM N/A 03/03/2017   Procedure: Thoracic Aortogram;  Surgeon: Nelva Bush, MD;  Location: [facility];  Service: Cardiovascular;  Laterality: N/A;    Elizabeth Mccullough Family History Is Significant For: Family History  Problem Relation Age of Onset  . Heart disease Mother   . Hyperlipidemia Sister   . Hyperlipidemia Brother   .  Diabetes Sister   . Hyperlipidemia Sister   . Cancer Sister 59       colon  . Cancer Sister        breast  . Stroke Brother   . Hypertension Brother   . Breast cancer Other   . Colon cancer Other   . Prostate cancer Son   . Diabetes Daughter   . Stomach cancer Daughter   . Healthy Daughter     Elizabeth Mccullough Social History Is Significant For: Social History   Socioeconomic History  . Marital status: Widowed    Spouse name: Not on file  . Number of children: 4  . Years of education: Not on file  . Highest education level: 12th grade  Occupational History  . Occupation: retired  Scientific laboratory technician  . Financial resource strain: Not on file  . Food insecurity    Worry: Not on file    Inability: Not on file  . Transportation needs    Medical: Not on file    Non-medical: Not on file  Tobacco Use  . Smoking status: Never Smoker  . Smokeless tobacco: Never Used  Substance and Sexual Activity  . Alcohol use: Yes    Comment: social wine   . Drug use: No  . Sexual activity: Not Currently    Partners: Male  Lifestyle  . Physical activity    Days per week: Not on file    Minutes per session: Not on file  . Stress: Not on file  Relationships  . Social Herbalist on phone: Not on file    Gets together: Not on file    Attends  religious service: Not on file    Active member of club or organization: Not on file    Attends meetings of clubs or organizations: Not on file    Relationship status: Not on file  Other Topics Concern  . Not on file  Social History Narrative   Exercise-- no   Pt lives with daughter [name] at home    Elizabeth Mccullough Allergies Are:  Allergies  Allergen Reactions  . Iohexol Rash     Code: RASH, Desc: PATIENT STATES SHE IS ALLERGIC TO IV DYE. 20 YRS AGO SHE HAD A REACTION AT [facility], PT WAS GIVEN BENADRYL. 07/13/06/RM, Onset Date: [id number]   . Penicillins Hives  :   Elizabeth Mccullough Current Medications Are:  Outpatient Encounter Medications as of 06/26/2019  Medication Sig  . aspirin EC 81 MG tablet Take 1 tablet (81 mg total) by mouth daily.  . bisoprolol (ZEBETA) 5 MG tablet Take 0.5 tablets (2.5 mg total) by mouth daily.  . Carbidopa-Levodopa ER (SINEMET CR) 25-100 MG tablet controlled release 2 in the AM, 2 in the afternoon, 1 in the evening  . diphenhydrAMINE (SLEEP AID, DIPHENHYDRAMINE,) 25 MG tablet Take 25 mg by mouth at bedtime as needed for sleep.  . furosemide (LASIX) 20 MG tablet TAKE 1 TABLET BY MOUTH DAILY.  Marland Kitchen losartan (COZAAR) 25 MG tablet Take 1 tablet (25 mg total) by mouth daily.  Marland Kitchen lovastatin (MEVACOR) 20 MG tablet Take 1 tablet (20 mg total) by mouth at bedtime. **PATIENT NEEDS APT FOR FURTHER REFILLS**  . Melatonin 3 MG CAPS Take 3-10 mg by mouth as needed. OTC  . Multiple Vitamin (MULTIVITAMIN) tablet Take 1 tablet by mouth daily.  . potassium chloride (K-DUR) 10 MEQ tablet TAKE 1 TABLET BY MOUTH DAILY.  Marland Kitchen venlafaxine XR (EFFEXOR-XR) 150 MG 24 hr capsule TAKE 1 CAPSULE BY  MOUTH DAILY WITH BREAKFAST. **PATIENT NEEDS APT FOR FURTHER REFILLS**   No facility-administered encounter medications on file as of 06/26/2019.   :  Review of Systems:  Out of a complete 14 point review of systems, all are reviewed and negative with the exception of these symptoms as listed below: Review of  Systems  Neurological:       Pt presents today to discuss Elizabeth Mccullough PD. Pt has questions about stopping effexor.    Objective:  Neurological Exam  Physical Exam Physical Examination:   Vitals:   06/26/19 1151  BP: (!) 154/80  Pulse: 79    General Examination: The patient is a very pleasant 78 y.o. female in no acute distress. She appears well-developed and well-nourished and well groomed.   HEENT: Normocephalic, atraumatic, pupils are equal, round and reactive to light and accommodation. Extraocular tracking is mildly impaired, no obvious gait limitation.  No nystagmus.  Face is mildly to moderately mass, no obvious facial tremor, moderate nuchal rigidity noted.  Speech is mild to moderately hypophonic, no dysarthria noted.  Hearing is grossly intact.  Airway examination reveals a small airway, moderate mouth dryness, otherwise benign findings, tongue protrudes centrally in palate elevates symmetrically.   Chest: Clear to auscultation without wheezing, rhonchi or crackles noted.  Heart: S1+S2+0, regular and normal ?mild diastolic heart murmur.   Abdomen: Soft, non-tender and non-distended with normal bowel sounds appreciated on auscultation.  Extremities: There is no pitting edema in the distal lower extremities bilaterally.  Skin: Warm and dry without trophic changes noted. There are Prominent varicose veins in the left calf.  Musculoskeletal: exam reveals Arthritic changes in both hands. LE dyskinesias, mild.  Neurologically:  Mental status: The patient is awake, alert and oriented in all 4 spheres. Elizabeth Mccullough immediate and remote memory, attention, language skills and fund of knowledge are appropriate. There is no evidence of aphasia, agnosia, apraxia or anomia. Speech is clear with normal prosody and enunciation. Thought process is linear. Mood is normal and affect is fairly normal today.  Cranial nerves II - XII are as described above under HEENT exam. In addition: shoulder shrug is  normal with equal shoulder height noted. Motor exam: Thin built, normal global strength for age, no obvious drift, she has a mild resting tremor in the right upper extremity, no obvious resting tremor noted otherwise.  She has no significant postural or action tremor.  On fine motor testing, she has moderate difficulty with the right hand with finger taps, rapid alternating padding and hand movements, mild to moderate on the left.  Foot taps are moderately impaired on the right and mild to moderate on the left.  Foot agility is mildly impaired bilaterally Romberg is not test for safety. Reflexes are 1+ throughout.   Sensory exam: intact to light touch in the upper and lower extremities.  Gait, station and balance: She stands up with difficulty and pushes herself up, she requires no assistance, posture is moderately stooped.  She walks with near absence of arm swing on the right, significantly reduced arm swing on the left.  She walks slowly and with decreased stride length.  No shuffling per se, no start had a station or freezing noted.  She has mild LE dyskinesias Cerebellar testing shows no dysmetria or intention tremor.  Balance is mildly impaired. She did not bring Elizabeth Mccullough walker or cane.  Assessment and Plan:   In summary, Elizabeth Mccullough is a very pleasant 78 year old female  with an underlying medical history of chronic  congestive heart failure, carotid artery disease, AAA, hyperlipidemia and Parkinson's disease, who Presents for Follow-up consultation of Elizabeth Mccullough history of right-sided predominant Parkinson's disease.  She has had mood related complications, including depressive symptoms and anxiety and has been on Effexor per PCP.  She indicates that she would like to come off of it.  She is encouraged to make an appointment or at least talk to Elizabeth Mccullough PCP about this.  She is advised that anxiety and depression often go hand-in-hand with Parkinson's disease and typically it is helpful to be on some  form an antidepressant for most patients, at some point. She had some physical therapy at home, she would be amenable to restarting it.  She is encouraged to let us know what agency she would like to go with.  She declines a referral to neuro rehab today.  I suggested evaluation through PT, OT and speech therapy.  She has not increased Elizabeth Mccullough Sinemet CR as discussed last time.  She is encouraged to take Sinemet CR 2 pills 3 times daily.  I provided instructions in writing again today.  She is advised to use Elizabeth Mccullough walker at all times as she reports 2 falls since our visit 3 months ago. She had a head CT in 2019, Elizabeth Mccullough brain MRI was denied by Elizabeth Mccullough insurance.  We will continue to monitor Elizabeth Mccullough clinically.  She is advised to follow-up with the nurse practitioner in 4 months, sooner if needed.  I answered all Elizabeth Mccullough questions today and she was in agreement.

## 2019-07-11 ENCOUNTER — Other Ambulatory Visit: Payer: Self-pay | Admitting: Family Medicine

## 2019-07-11 DIAGNOSIS — E785 Hyperlipidemia, unspecified: Secondary | ICD-10-CM

## 2019-07-11 DIAGNOSIS — F411 Generalized anxiety disorder: Secondary | ICD-10-CM

## 2019-07-11 DIAGNOSIS — F32A Depression, unspecified: Secondary | ICD-10-CM

## 2019-07-11 DIAGNOSIS — F329 Major depressive disorder, single episode, unspecified: Secondary | ICD-10-CM

## 2019-07-16 ENCOUNTER — Emergency Department
Admission: EM | Admit: 2019-07-16 | Discharge: 2019-07-16 | Disposition: A | Discharge status: Home / Self Care | Payer: Medicare Other | Source: Home / Self Care

## 2019-07-16 ENCOUNTER — Ambulatory Visit: Payer: Medicare Other | Admitting: Family Medicine

## 2019-07-16 ENCOUNTER — Encounter: Payer: Self-pay | Admitting: Family Medicine

## 2019-07-16 ENCOUNTER — Other Ambulatory Visit: Payer: Self-pay

## 2019-07-16 ENCOUNTER — Emergency Department (INDEPENDENT_AMBULATORY_CARE_PROVIDER_SITE_OTHER): Payer: Medicare Other

## 2019-07-16 DIAGNOSIS — S0990XA Unspecified injury of head, initial encounter: Secondary | ICD-10-CM

## 2019-07-16 DIAGNOSIS — X58XXXA Exposure to other specified factors, initial encounter: Secondary | ICD-10-CM

## 2019-07-16 DIAGNOSIS — S0083XA Contusion of other part of head, initial encounter: Secondary | ICD-10-CM | POA: Diagnosis not present

## 2019-07-16 DIAGNOSIS — M542 Cervicalgia: Secondary | ICD-10-CM | POA: Diagnosis not present

## 2019-07-16 DIAGNOSIS — R4182 Altered mental status, unspecified: Secondary | ICD-10-CM | POA: Diagnosis not present

## 2019-07-16 DIAGNOSIS — S161XXA Strain of muscle, fascia and tendon at neck level, initial encounter: Secondary | ICD-10-CM

## 2019-07-16 MED ORDER — HYDROCODONE-ACETAMINOPHEN 5-325 MG PO TABS: 1.0000 | ORAL_TABLET | Freq: Four times a day (QID) | ORAL | 0 refills | Status: DC | PRN

## 2019-07-16 NOTE — ED Triage Notes (Signed)
Pt fell Saturday before Christmas, has a bump on right forehead, and black eye.  This morning around 4 am fell again, and hit fore head on bed frame, and whipped head back.  No bump noted on forehead, but c/o neck pain and lower head pain.
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
## 2019-07-19 ENCOUNTER — Other Ambulatory Visit: Payer: Self-pay | Admitting: Family Medicine

## 2019-07-19 DIAGNOSIS — F32A Depression, unspecified: Secondary | ICD-10-CM

## 2019-07-19 DIAGNOSIS — F411 Generalized anxiety disorder: Secondary | ICD-10-CM

## 2019-07-19 DIAGNOSIS — F329 Major depressive disorder, single episode, unspecified: Secondary | ICD-10-CM

## 2019-07-31 ENCOUNTER — Other Ambulatory Visit: Payer: Self-pay | Admitting: Cardiovascular Disease

## 2019-08-03 ENCOUNTER — Other Ambulatory Visit: Payer: Self-pay | Admitting: Neurology

## 2019-08-03 ENCOUNTER — Other Ambulatory Visit: Payer: Self-pay | Admitting: Family Medicine

## 2019-08-03 DIAGNOSIS — F329 Major depressive disorder, single episode, unspecified: Secondary | ICD-10-CM

## 2019-08-03 DIAGNOSIS — F32A Depression, unspecified: Secondary | ICD-10-CM

## 2019-08-03 DIAGNOSIS — F411 Generalized anxiety disorder: Secondary | ICD-10-CM

## 2019-08-06 ENCOUNTER — Telehealth: Payer: Self-pay | Admitting: Neurology

## 2019-08-06 ENCOUNTER — Emergency Department (HOSPITAL_BASED_OUTPATIENT_CLINIC_OR_DEPARTMENT_OTHER)
Admission: EM | Admit: 2019-08-06 | Discharge: 2019-08-06 | Discharge status: Against Medical Advice | Payer: Medicare Other

## 2019-08-06 ENCOUNTER — Other Ambulatory Visit: Payer: Self-pay

## 2019-08-06 MED ORDER — CARBIDOPA-LEVODOPA ER 25-100 MG PO TBCR: EXTENDED_RELEASE_TABLET | ORAL | 1 refills | Status: DC

## 2019-08-06 NOTE — ED Notes (Signed)
Peggye Form, RN states pt and daughter are deciding if she wants to be seen

## 2019-08-06 NOTE — Telephone Encounter (Signed)
Chart reviewed and refill is appropriate. Rx has been sent.

## 2019-08-06 NOTE — Telephone Encounter (Signed)
Pt is requesting a refill of Carbidopa-Levodopa ER (SINEMET CR) 25-100 MG tablet controlled release, to be sent to [place], [place]

## 2019-08-08 ENCOUNTER — Other Ambulatory Visit: Payer: Self-pay | Admitting: Family Medicine

## 2019-08-08 DIAGNOSIS — E785 Hyperlipidemia, unspecified: Secondary | ICD-10-CM

## 2019-08-09 ENCOUNTER — Ambulatory Visit (INDEPENDENT_AMBULATORY_CARE_PROVIDER_SITE_OTHER): Payer: Medicare Other | Admitting: Family Medicine

## 2019-08-09 ENCOUNTER — Encounter: Payer: Self-pay | Admitting: Family Medicine

## 2019-08-09 ENCOUNTER — Other Ambulatory Visit: Payer: Self-pay

## 2019-08-09 VITALS — BP 144/79 | HR 71 | Temp 97.6°F | Resp 12 | Ht 59.84 in | Wt 133.2 lb

## 2019-08-09 DIAGNOSIS — F411 Generalized anxiety disorder: Secondary | ICD-10-CM

## 2019-08-09 DIAGNOSIS — F329 Major depressive disorder, single episode, unspecified: Secondary | ICD-10-CM | POA: Diagnosis not present

## 2019-08-09 DIAGNOSIS — F32A Depression, unspecified: Secondary | ICD-10-CM

## 2019-08-09 DIAGNOSIS — I5042 Chronic combined systolic (congestive) and diastolic (congestive) heart failure: Secondary | ICD-10-CM

## 2019-08-09 DIAGNOSIS — I714 Abdominal aortic aneurysm, without rupture, unspecified: Secondary | ICD-10-CM

## 2019-08-09 DIAGNOSIS — I428 Other cardiomyopathies: Secondary | ICD-10-CM | POA: Diagnosis not present

## 2019-08-09 DIAGNOSIS — G20 Parkinson's disease: Secondary | ICD-10-CM | POA: Diagnosis not present

## 2019-08-09 DIAGNOSIS — R42 Dizziness and giddiness: Secondary | ICD-10-CM | POA: Diagnosis not present

## 2019-08-09 DIAGNOSIS — I779 Disorder of arteries and arterioles, unspecified: Secondary | ICD-10-CM

## 2019-08-09 DIAGNOSIS — E785 Hyperlipidemia, unspecified: Secondary | ICD-10-CM

## 2019-08-09 DIAGNOSIS — Z022 Encounter for examination for admission to residential institution: Secondary | ICD-10-CM | POA: Diagnosis not present

## 2019-08-09 LAB — POCT URINALYSIS DIPSTICK
Bilirubin, UA: NEGATIVE
Glucose, UA: NEGATIVE
Ketones, UA: NEGATIVE
Nitrite, UA: POSITIVE
Protein, UA: NEGATIVE
Spec Grav, UA: >=1.03 — AB (ref 1.010–1.025)
Urobilinogen, UA: 0.2 E.U./dL
pH, UA: 6 (ref 5.0–8.0)

## 2019-08-09 MED ORDER — LEVOFLOXACIN 250 MG PO TABS: 250.0000 mg | ORAL_TABLET | Freq: Every day | ORAL | 0 refills | Status: AC

## 2019-08-09 MED ORDER — VENLAFAXINE HCL ER 150 MG PO CP24: ORAL_CAPSULE | ORAL | 0 refills | Status: DC

## 2019-08-09 NOTE — Patient Instructions (Signed)
Again as I recommended in the past, your mother really needs to see and be treated by a psychiatrist regarding her mood disorder which is most likely being affected by her Parkinson's/neurological disease as well.    Please call and make that appointment.
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
## 2019-08-10 ENCOUNTER — Ambulatory Visit: Payer: Medicare Other | Attending: Internal Medicine

## 2019-08-10 DIAGNOSIS — Z20822 Contact with and (suspected) exposure to covid-19: Secondary | ICD-10-CM | POA: Diagnosis not present

## 2019-08-11 LAB — NOVEL CORONAVIRUS, NAA: SARS-CoV-2, NAA: NOT DETECTED

## 2019-08-12 LAB — URINE CULTURE

## 2019-08-13 ENCOUNTER — Other Ambulatory Visit: Payer: Self-pay

## 2019-08-13 ENCOUNTER — Ambulatory Visit (INDEPENDENT_AMBULATORY_CARE_PROVIDER_SITE_OTHER): Payer: Medicare Other | Admitting: Family Medicine

## 2019-08-13 ENCOUNTER — Telehealth: Payer: Self-pay | Admitting: Family Medicine

## 2019-08-13 VITALS — BP 144/79 | Ht 59.0 in | Wt 133.0 lb

## 2019-08-13 DIAGNOSIS — Z111 Encounter for screening for respiratory tuberculosis: Secondary | ICD-10-CM | POA: Diagnosis not present

## 2019-08-13 NOTE — Telephone Encounter (Signed)
See note that was sent to the family/patient regarding her recent UTI under the urine culture results.  She just came out of the cold.  I would expect them to be cold to touch at this time.  Call the family tell them to continue to monitor.  I recommend they get a pulse oximeter that you buy at [facility] or [place] to monitor this at home.

## 2019-08-13 NOTE — Progress Notes (Signed)
Patient is here for PPD placement. PPD placed in L forearm. Patient tolerated injection well without complications.

## 2019-08-13 NOTE — Telephone Encounter (Signed)
Patient daughter is aware of the below and verbalized understanding. AS, CMA

## 2019-08-13 NOTE — Telephone Encounter (Signed)
Patient came into office today for [place] TB skin test. Patient daughter requesting results from urine culture. Wanting to know if patient needs more medication. Only given 3 days of antibiotic.  Also states that patients hands are discolored at fingers. I did see this and they are cold to touch and a blue/gray color. I suggested that patient schedule apt to discuss with you but daughter refused and said for me to mention this to you.   AS, CMA

## 2019-08-14 ENCOUNTER — Ambulatory Visit (HOSPITAL_COMMUNITY): Payer: Self-pay | Admitting: Psychiatry

## 2019-08-15 ENCOUNTER — Ambulatory Visit (INDEPENDENT_AMBULATORY_CARE_PROVIDER_SITE_OTHER): Payer: Medicare Other | Admitting: Family Medicine

## 2019-08-15 ENCOUNTER — Other Ambulatory Visit: Payer: Self-pay

## 2019-08-15 DIAGNOSIS — Z111 Encounter for screening for respiratory tuberculosis: Secondary | ICD-10-CM

## 2019-08-15 LAB — TB SKIN TEST
Induration: 0 mm
TB Skin Test: NEGATIVE

## 2019-08-15 NOTE — Progress Notes (Signed)
HPI:  Patient is here for PPD read.  A&P:  Results were negative, 0 mm induration.  Charyl Bigger, CMA

## 2019-08-20 ENCOUNTER — Telehealth: Payer: Self-pay | Admitting: Family Medicine

## 2019-08-20 ENCOUNTER — Telehealth: Payer: Self-pay | Admitting: Cardiovascular Disease

## 2019-08-20 NOTE — Telephone Encounter (Signed)
Patient & staff @ [facility] are trying to get Approval to give pt Ibuprofen for pain relief--- (per pt's daughter this is something she has been taking OTC not prescribed by any doctor.  --Forwarding request to med asst for review w/ provider & to contact the [facility] w/ [id number]  [phone]  --glh

## 2019-08-20 NOTE — Telephone Encounter (Signed)
New message:    Patient daughter calling stating that her mother is at [facility] on [address] [phone]. Patient will be there for a mouth and they need a order of all medication to be sent. Patient has been with medication for four days. If any question please call daughter. Patient daughter would like for some to call when that information has been sent also need Ibuprofen.

## 2019-08-20 NOTE — Telephone Encounter (Signed)
Spoke with Levada Dy at [facility] who states they need a list of patient's medications faxed to them at [phone]. Levada Dy states patient has requested ibuprofen which I advised we do not prescribe regularly.  I left a message for patient's daughter Juliann Pulse and advised her that I am faxing the medication list and that we do not prescribe ibuprofen on a regular basis due to its effect on blood pressure and increased incidence of bleeding. I advised her to call back with questions or concerns.

## 2019-08-21 MED ORDER — ACETAMINOPHEN 325 MG PO CAPS: 1.0000 | ORAL_CAPSULE | Freq: Three times a day (TID) | ORAL | 0 refills | Status: DC | PRN

## 2019-08-21 NOTE — Telephone Encounter (Signed)
I spoke with Teresa-director of nursing and she states there are no on site providers and that patient has left [place] as PCP. Patient has had headache since Saturday and they are requesting an order for tylenol or ibuprofen for the headache and that the order would have to come from [place] since patient has left [place] as her PCP.   Helene [phone]  Please advise.

## 2019-08-21 NOTE — Telephone Encounter (Signed)
Please review and advise. AS, CMA 

## 2019-08-21 NOTE — Telephone Encounter (Signed)
If the onsite medical providers feel it is necessary, and that order will need to come from them.  I do not recommend ibuprofen for the patient.

## 2019-08-21 NOTE — Telephone Encounter (Signed)
Per my conversation with my medical assistant, Elizabeth Mccullough, patient is only there for 30 days and as such, she did not establish with the medical staff on site, she still using [place] as her PCP.  This is fine however, if she stays beyond 30 days, please notify family that they will need to establish with an onsite medical team member and I will not be able to be her PCP if she remains there.    However, for her headache and treatment now please see below. 325 mg of acetaminophen tabs.  Take 1-2 p.o. every 8 hours as needed moderate to severe pain dispense 30, no refill

## 2019-08-21 NOTE — Telephone Encounter (Signed)
Spoke with Helene Kelp from facility. She is aware of the order and requested med be sent to patient pharmacy and that she would inform patients daughter that this order is only good for 30 days and have them pick up script.   Rx sent to pharmacy. AS, CMA

## 2019-08-24 ENCOUNTER — Emergency Department (HOSPITAL_COMMUNITY): Payer: Medicare Other

## 2019-08-24 ENCOUNTER — Emergency Department (HOSPITAL_COMMUNITY)
Admission: EM | Admit: 2019-08-24 | Discharge: 2019-08-25 | Disposition: A | Discharge status: Skilled Nursing Facility | Payer: Medicare Other | Attending: Emergency Medicine | Admitting: Emergency Medicine

## 2019-08-24 ENCOUNTER — Other Ambulatory Visit: Payer: Self-pay

## 2019-08-24 ENCOUNTER — Encounter (HOSPITAL_COMMUNITY): Payer: Self-pay

## 2019-08-24 DIAGNOSIS — Y999 Unspecified external cause status: Secondary | ICD-10-CM | POA: Insufficient documentation

## 2019-08-24 DIAGNOSIS — Z79899 Other long term (current) drug therapy: Secondary | ICD-10-CM | POA: Diagnosis not present

## 2019-08-24 DIAGNOSIS — R296 Repeated falls: Secondary | ICD-10-CM | POA: Diagnosis not present

## 2019-08-24 DIAGNOSIS — S0081XA Abrasion of other part of head, initial encounter: Secondary | ICD-10-CM | POA: Diagnosis not present

## 2019-08-24 DIAGNOSIS — S0031XA Abrasion of nose, initial encounter: Secondary | ICD-10-CM | POA: Diagnosis not present

## 2019-08-24 DIAGNOSIS — S50312A Abrasion of left elbow, initial encounter: Secondary | ICD-10-CM | POA: Diagnosis not present

## 2019-08-24 DIAGNOSIS — Y9389 Activity, other specified: Secondary | ICD-10-CM | POA: Insufficient documentation

## 2019-08-24 DIAGNOSIS — S8001XA Contusion of right knee, initial encounter: Secondary | ICD-10-CM | POA: Diagnosis not present

## 2019-08-24 DIAGNOSIS — R52 Pain, unspecified: Secondary | ICD-10-CM | POA: Diagnosis not present

## 2019-08-24 DIAGNOSIS — Z7982 Long term (current) use of aspirin: Secondary | ICD-10-CM | POA: Insufficient documentation

## 2019-08-24 DIAGNOSIS — W01198A Fall on same level from slipping, tripping and stumbling with subsequent striking against other object, initial encounter: Secondary | ICD-10-CM | POA: Diagnosis not present

## 2019-08-24 DIAGNOSIS — Y92128 Other place in nursing home as the place of occurrence of the external cause: Secondary | ICD-10-CM | POA: Insufficient documentation

## 2019-08-24 DIAGNOSIS — G20 Parkinson's disease: Secondary | ICD-10-CM | POA: Insufficient documentation

## 2019-08-24 DIAGNOSIS — W19XXXA Unspecified fall, initial encounter: Secondary | ICD-10-CM | POA: Diagnosis not present

## 2019-08-24 DIAGNOSIS — I5042 Chronic combined systolic (congestive) and diastolic (congestive) heart failure: Secondary | ICD-10-CM | POA: Insufficient documentation

## 2019-08-24 DIAGNOSIS — G44309 Post-traumatic headache, unspecified, not intractable: Secondary | ICD-10-CM | POA: Diagnosis not present

## 2019-08-24 DIAGNOSIS — S8002XA Contusion of left knee, initial encounter: Secondary | ICD-10-CM | POA: Insufficient documentation

## 2019-08-24 DIAGNOSIS — M7989 Other specified soft tissue disorders: Secondary | ICD-10-CM | POA: Diagnosis not present

## 2019-08-24 DIAGNOSIS — I10 Essential (primary) hypertension: Secondary | ICD-10-CM | POA: Diagnosis not present

## 2019-08-24 DIAGNOSIS — S8991XA Unspecified injury of right lower leg, initial encounter: Secondary | ICD-10-CM | POA: Diagnosis not present

## 2019-08-24 DIAGNOSIS — T07XXXA Unspecified multiple injuries, initial encounter: Secondary | ICD-10-CM

## 2019-08-24 LAB — BASIC METABOLIC PANEL
Anion gap: 10 (ref 5–15)
BUN: 27 mg/dL — ABNORMAL HIGH (ref 8–23)
CO2: 29 mmol/L (ref 22–32)
Calcium: 8.9 mg/dL (ref 8.9–10.3)
Chloride: 103 mmol/L (ref 98–111)
Creatinine, Ser: 1.24 mg/dL — ABNORMAL HIGH (ref 0.44–1.00)
GFR calc Af Amer: 48 mL/min — ABNORMAL LOW (ref >60)
GFR calc non Af Amer: 41 mL/min — ABNORMAL LOW (ref >60)
Glucose, Bld: 93 mg/dL (ref 70–99)
Potassium: 3.9 mmol/L (ref 3.5–5.1)
Sodium: 142 mmol/L (ref 135–145)

## 2019-08-24 LAB — CBC WITH DIFFERENTIAL/PLATELET
Abs Immature Granulocytes: 0.03 10*3/uL (ref 0.00–0.07)
Basophils Absolute: 0 10*3/uL (ref 0.0–0.1)
Basophils Relative: 1 %
Eosinophils Absolute: 0.2 10*3/uL (ref 0.0–0.5)
Eosinophils Relative: 3 %
HCT: 38.9 % (ref 36.0–46.0)
Hemoglobin: 12.1 g/dL (ref 12.0–15.0)
Immature Granulocytes: 0 %
Lymphocytes Relative: 29 %
Lymphs Abs: 2.1 10*3/uL (ref 0.7–4.0)
MCH: 31.4 pg (ref 26.0–34.0)
MCHC: 31.1 g/dL (ref 30.0–36.0)
MCV: 101 fL — ABNORMAL HIGH (ref 80.0–100.0)
Monocytes Absolute: 0.8 10*3/uL (ref 0.1–1.0)
Monocytes Relative: 11 %
Neutro Abs: 4.2 10*3/uL (ref 1.7–7.7)
Neutrophils Relative %: 56 %
Platelets: 295 10*3/uL (ref 150–400)
RBC: 3.85 MIL/uL — ABNORMAL LOW (ref 3.87–5.11)
RDW: 12.9 % (ref 11.5–15.5)
WBC: 7.3 10*3/uL (ref 4.0–10.5)
nRBC: 0 % (ref 0.0–0.2)

## 2019-08-24 LAB — URINALYSIS, ROUTINE W REFLEX MICROSCOPIC
Bilirubin Urine: NEGATIVE
Glucose, UA: NEGATIVE mg/dL
Hgb urine dipstick: NEGATIVE
Ketones, ur: 5 mg/dL — AB
Leukocytes,Ua: NEGATIVE
Nitrite: NEGATIVE
Protein, ur: NEGATIVE mg/dL
Specific Gravity, Urine: 1.013 (ref 1.005–1.030)
pH: 6 (ref 5.0–8.0)

## 2019-08-24 MED ORDER — ACETAMINOPHEN 325 MG PO TABS
650.0000 mg | ORAL_TABLET | Freq: Once | ORAL | Status: AC
  Administered 2019-08-24: 650 mg via ORAL
  Filled 2019-08-24: qty 2

## 2019-08-24 NOTE — ED Triage Notes (Signed)
Pt BIB EMS from [facility]. Facility called out because patient fell last night and 2x today. Bruises to right knee, left elbow, and forehead. A/Ox4.p Pt hx of Parkinsons. Pt is not on blood thinners.   BP 132/71 RR 16 98% RA  HR 87
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
## 2019-08-24 NOTE — ED Notes (Signed)
Attempted to call report to pt's facility with no answer.  Spoke with Pt's daughter Elizabeth Mccullough and discharge instructions gone over with daughter. She verbalizes understanding and states that an MD called and spoke with her sister as well.

## 2019-08-24 NOTE — ED Notes (Signed)
Staff at facility contacted successfully and made aware that pt was waiting for transport back top [facility] at this time.

## 2019-08-24 NOTE — ED Notes (Signed)
[facility] CALLED  °

## 2019-08-24 NOTE — ED Notes (Signed)
Daughter, Kerin Salen, daughter, would like to speak to doctor she said her mom has fallen 10 or 12 times in the last month. Her phone number is [phone].

## 2019-08-24 NOTE — ED Notes (Signed)
Pt transported to X-ray and will then go to CT

## 2019-08-24 NOTE — ED Notes (Addendum)
Call received from pt daughter [name] [phone], requesting rtn call for pt updates and assessment when possible. [employer]

## 2019-08-25 DIAGNOSIS — W19XXXA Unspecified fall, initial encounter: Secondary | ICD-10-CM | POA: Diagnosis not present

## 2019-08-25 DIAGNOSIS — Z743 Need for continuous supervision: Secondary | ICD-10-CM | POA: Diagnosis not present

## 2019-08-25 DIAGNOSIS — I10 Essential (primary) hypertension: Secondary | ICD-10-CM | POA: Diagnosis not present

## 2019-08-25 DIAGNOSIS — R279 Unspecified lack of coordination: Secondary | ICD-10-CM | POA: Diagnosis not present

## 2019-08-26 LAB — URINE CULTURE: Culture: <10000 — AB

## 2019-08-30 DIAGNOSIS — Z20828 Contact with and (suspected) exposure to other viral communicable diseases: Secondary | ICD-10-CM | POA: Diagnosis not present

## 2019-08-30 DIAGNOSIS — Z03818 Encounter for observation for suspected exposure to other biological agents ruled out: Secondary | ICD-10-CM | POA: Diagnosis not present

## 2019-09-03 DIAGNOSIS — M6281 Muscle weakness (generalized): Secondary | ICD-10-CM | POA: Diagnosis not present

## 2019-09-03 DIAGNOSIS — R2681 Unsteadiness on feet: Secondary | ICD-10-CM | POA: Diagnosis not present

## 2019-09-04 DIAGNOSIS — M6281 Muscle weakness (generalized): Secondary | ICD-10-CM | POA: Diagnosis not present

## 2019-09-04 DIAGNOSIS — M25561 Pain in right knee: Secondary | ICD-10-CM | POA: Diagnosis not present

## 2019-09-04 DIAGNOSIS — R2681 Unsteadiness on feet: Secondary | ICD-10-CM | POA: Diagnosis not present

## 2019-09-04 DIAGNOSIS — R498 Other voice and resonance disorders: Secondary | ICD-10-CM | POA: Diagnosis not present

## 2019-09-04 DIAGNOSIS — R41841 Cognitive communication deficit: Secondary | ICD-10-CM | POA: Diagnosis not present

## 2019-09-04 DIAGNOSIS — M25562 Pain in left knee: Secondary | ICD-10-CM | POA: Diagnosis not present

## 2019-09-05 DIAGNOSIS — R2681 Unsteadiness on feet: Secondary | ICD-10-CM | POA: Diagnosis not present

## 2019-09-05 DIAGNOSIS — M25562 Pain in left knee: Secondary | ICD-10-CM | POA: Diagnosis not present

## 2019-09-05 DIAGNOSIS — M25561 Pain in right knee: Secondary | ICD-10-CM | POA: Diagnosis not present

## 2019-09-05 DIAGNOSIS — M6281 Muscle weakness (generalized): Secondary | ICD-10-CM | POA: Diagnosis not present

## 2019-09-06 DIAGNOSIS — R2681 Unsteadiness on feet: Secondary | ICD-10-CM | POA: Diagnosis not present

## 2019-09-06 DIAGNOSIS — M6281 Muscle weakness (generalized): Secondary | ICD-10-CM | POA: Diagnosis not present

## 2019-09-07 DIAGNOSIS — M6281 Muscle weakness (generalized): Secondary | ICD-10-CM | POA: Diagnosis not present

## 2019-09-07 DIAGNOSIS — R41841 Cognitive communication deficit: Secondary | ICD-10-CM | POA: Diagnosis not present

## 2019-09-07 DIAGNOSIS — M25562 Pain in left knee: Secondary | ICD-10-CM | POA: Diagnosis not present

## 2019-09-07 DIAGNOSIS — R498 Other voice and resonance disorders: Secondary | ICD-10-CM | POA: Diagnosis not present

## 2019-09-07 DIAGNOSIS — R2681 Unsteadiness on feet: Secondary | ICD-10-CM | POA: Diagnosis not present

## 2019-09-07 DIAGNOSIS — M25561 Pain in right knee: Secondary | ICD-10-CM | POA: Diagnosis not present

## 2019-09-10 DIAGNOSIS — M25561 Pain in right knee: Secondary | ICD-10-CM | POA: Diagnosis not present

## 2019-09-10 DIAGNOSIS — M25562 Pain in left knee: Secondary | ICD-10-CM | POA: Diagnosis not present

## 2019-09-10 DIAGNOSIS — R2681 Unsteadiness on feet: Secondary | ICD-10-CM | POA: Diagnosis not present

## 2019-09-10 DIAGNOSIS — M6281 Muscle weakness (generalized): Secondary | ICD-10-CM | POA: Diagnosis not present

## 2019-09-10 DIAGNOSIS — R41841 Cognitive communication deficit: Secondary | ICD-10-CM | POA: Diagnosis not present

## 2019-09-10 DIAGNOSIS — R498 Other voice and resonance disorders: Secondary | ICD-10-CM | POA: Diagnosis not present

## 2019-09-11 ENCOUNTER — Telehealth: Payer: Self-pay | Admitting: Family Medicine

## 2019-09-11 DIAGNOSIS — M25562 Pain in left knee: Secondary | ICD-10-CM | POA: Diagnosis not present

## 2019-09-11 DIAGNOSIS — M6281 Muscle weakness (generalized): Secondary | ICD-10-CM | POA: Diagnosis not present

## 2019-09-11 DIAGNOSIS — R2681 Unsteadiness on feet: Secondary | ICD-10-CM | POA: Diagnosis not present

## 2019-09-11 DIAGNOSIS — M25561 Pain in right knee: Secondary | ICD-10-CM | POA: Diagnosis not present

## 2019-09-11 NOTE — Telephone Encounter (Signed)
PT has relocated back to this area. Her daughter is Calling to see if she can reestablish  Care with [facility]. Please Advise

## 2019-09-11 NOTE — Telephone Encounter (Signed)
DONE

## 2019-09-11 NOTE — Telephone Encounter (Signed)
yes

## 2019-09-11 NOTE — Telephone Encounter (Signed)
Please advice  

## 2019-09-12 DIAGNOSIS — M25562 Pain in left knee: Secondary | ICD-10-CM | POA: Diagnosis not present

## 2019-09-12 DIAGNOSIS — M25561 Pain in right knee: Secondary | ICD-10-CM | POA: Diagnosis not present

## 2019-09-12 DIAGNOSIS — M6281 Muscle weakness (generalized): Secondary | ICD-10-CM | POA: Diagnosis not present

## 2019-09-12 DIAGNOSIS — R2681 Unsteadiness on feet: Secondary | ICD-10-CM | POA: Diagnosis not present

## 2019-09-13 DIAGNOSIS — R2681 Unsteadiness on feet: Secondary | ICD-10-CM | POA: Diagnosis not present

## 2019-09-13 DIAGNOSIS — R498 Other voice and resonance disorders: Secondary | ICD-10-CM | POA: Diagnosis not present

## 2019-09-13 DIAGNOSIS — M6281 Muscle weakness (generalized): Secondary | ICD-10-CM | POA: Diagnosis not present

## 2019-09-13 DIAGNOSIS — R41841 Cognitive communication deficit: Secondary | ICD-10-CM | POA: Diagnosis not present

## 2019-09-14 ENCOUNTER — Other Ambulatory Visit: Payer: Self-pay

## 2019-09-14 DIAGNOSIS — M6281 Muscle weakness (generalized): Secondary | ICD-10-CM | POA: Diagnosis not present

## 2019-09-14 DIAGNOSIS — R2681 Unsteadiness on feet: Secondary | ICD-10-CM | POA: Diagnosis not present

## 2019-09-17 ENCOUNTER — Ambulatory Visit: Payer: Medicare Other | Admitting: Family Medicine

## 2019-09-17 ENCOUNTER — Encounter: Payer: Medicare Other | Admitting: Family Medicine

## 2019-09-17 ENCOUNTER — Other Ambulatory Visit: Payer: Self-pay

## 2019-09-17 ENCOUNTER — Encounter: Payer: Self-pay | Admitting: Family Medicine

## 2019-09-17 VITALS — BP 108/66 | HR 71 | Temp 97.3°F | Resp 18 | Ht 59.0 in | Wt 131.6 lb

## 2019-09-17 DIAGNOSIS — M25562 Pain in left knee: Secondary | ICD-10-CM | POA: Diagnosis not present

## 2019-09-17 DIAGNOSIS — R498 Other voice and resonance disorders: Secondary | ICD-10-CM | POA: Diagnosis not present

## 2019-09-17 DIAGNOSIS — R41841 Cognitive communication deficit: Secondary | ICD-10-CM | POA: Diagnosis not present

## 2019-09-17 DIAGNOSIS — R2681 Unsteadiness on feet: Secondary | ICD-10-CM | POA: Diagnosis not present

## 2019-09-17 DIAGNOSIS — I712 Thoracic aortic aneurysm, without rupture, unspecified: Secondary | ICD-10-CM

## 2019-09-17 DIAGNOSIS — F411 Generalized anxiety disorder: Secondary | ICD-10-CM

## 2019-09-17 DIAGNOSIS — G20 Parkinson's disease: Secondary | ICD-10-CM | POA: Diagnosis not present

## 2019-09-17 DIAGNOSIS — E785 Hyperlipidemia, unspecified: Secondary | ICD-10-CM

## 2019-09-17 DIAGNOSIS — M6281 Muscle weakness (generalized): Secondary | ICD-10-CM | POA: Diagnosis not present

## 2019-09-17 DIAGNOSIS — R296 Repeated falls: Secondary | ICD-10-CM | POA: Diagnosis not present

## 2019-09-17 DIAGNOSIS — I5042 Chronic combined systolic (congestive) and diastolic (congestive) heart failure: Secondary | ICD-10-CM

## 2019-09-17 DIAGNOSIS — M25561 Pain in right knee: Secondary | ICD-10-CM | POA: Diagnosis not present

## 2019-09-17 LAB — COMPREHENSIVE METABOLIC PANEL
ALT: 3 U/L (ref 0–35)
AST: 15 U/L (ref 0–37)
Albumin: 4.1 g/dL (ref 3.5–5.2)
Alkaline Phosphatase: 96 U/L (ref 39–117)
BUN: 22 mg/dL (ref 6–23)
CO2: 34 mEq/L — ABNORMAL HIGH (ref 19–32)
Calcium: 9 mg/dL (ref 8.4–10.5)
Chloride: 99 mEq/L (ref 96–112)
Creatinine, Ser: 0.92 mg/dL (ref 0.40–1.20)
GFR: 58.59 mL/min — ABNORMAL LOW (ref >60.00)
Glucose, Bld: 85 mg/dL (ref 70–99)
Potassium: 4.3 mEq/L (ref 3.5–5.1)
Sodium: 141 mEq/L (ref 135–145)
Total Bilirubin: 0.4 mg/dL (ref 0.2–1.2)
Total Protein: 6.7 g/dL (ref 6.0–8.3)

## 2019-09-17 LAB — LIPID PANEL
Cholesterol: 166 mg/dL (ref 0–200)
HDL: 57.6 mg/dL (ref >39.00)
LDL Cholesterol: 89 mg/dL (ref 0–99)
NonHDL: 108.4
Total CHOL/HDL Ratio: 3
Triglycerides: 95 mg/dL (ref 0.0–149.0)
VLDL: 19 mg/dL (ref 0.0–40.0)

## 2019-09-17 NOTE — Assessment & Plan Note (Signed)
Per neuro con't meds 

## 2019-09-17 NOTE — Assessment & Plan Note (Signed)
Per CTS

## 2019-09-17 NOTE — Assessment & Plan Note (Signed)
Tolerating statin, encouraged heart healthy diet, avoid trans fats, minimize simple carbs and saturated fats. Increase exercise as tolerated 

## 2019-09-17 NOTE — Assessment & Plan Note (Signed)
Pt / ot ordered to be done at assisted living facility

## 2019-09-17 NOTE — Progress Notes (Signed)
Patient ID: Elizabeth Mccullough, female    DOB: 1941-06-18  Age: 79 y.o. MRN: [id number]    Subjective:  Subjective  HPI Elizabeth Mccullough presents for f/u and to reestablish.  Her son is with her .   No new complaints.  Pt sees neuro for PD and cardiology   Review of Systems  Constitutional: Negative for activity change, appetite change, diaphoresis, fatigue and unexpected weight change.  Eyes: Negative for pain, redness and visual disturbance.  Respiratory: Negative for cough, chest tightness, shortness of breath and wheezing.   Cardiovascular: Negative for chest pain, palpitations and leg swelling.  Endocrine: Negative for cold intolerance, heat intolerance, polydipsia, polyphagia and polyuria.  Genitourinary: Negative for difficulty urinating, dysuria and frequency.  Neurological: Negative for dizziness, light-headedness, numbness and headaches.  Psychiatric/Behavioral: Negative for behavioral problems and dysphoric mood. The patient is not nervous/anxious.     History Past Medical History:  Diagnosis Date  . AAA (abdominal aortic aneurysm) ([place])   . Carotid artery disease ([place]) 01/12/2017   Carotid US 6/18: Bilat < 50%; repeat in 12/2000  . Chronic combined systolic and diastolic CHF (congestive heart failure) ([place]) 03/01/2017   NICM // [place] 8/18: normal coronary arteries /  Echo 8/18: EF 20-25, mod AI, mod MR, PASP 32 // Echo 1/19: EF 55-60, no RWMA, Gr 1 DD, mod AI, mild MR, PASP 32  . Hyperlipidemia   . Parkinson's disease [facility])     She has a past surgical history that includes Hammer toe surgery (04/10/02); Breast cyst aspiration (1965); Nasal septum surgery (1980); Knee surgery (Right, 10/17/15); RIGHT/LEFT HEART CATH AND CORONARY ANGIOGRAPHY (N/A, 03/03/2017); and THORACIC AORTOGRAM (N/A, 03/03/2017).   Her family history includes Breast cancer in an other family member; Cancer in her sister; Cancer (age of onset: 52) in her sister; Colon cancer in an other family member;  Diabetes in her daughter and sister; Healthy in her daughter; Heart disease in her mother; Hyperlipidemia in her brother, sister, and sister; Hypertension in her brother; Prostate cancer in her son; Stomach cancer in her daughter; Stroke in her brother.She reports that she has never smoked. She has never used smokeless tobacco. She reports current alcohol use. She reports that she does not use drugs.  Current Outpatient Medications on File Prior to Visit  Medication Sig Dispense Refill  . Acetaminophen 325 MG CAPS Take 1-2 capsules by mouth every 8 (eight) hours as needed (moderate to severe pain). 30 capsule 0  . aspirin EC 81 MG tablet Take 1 tablet (81 mg total) by mouth daily.    . bisoprolol (ZEBETA) 5 MG tablet Take 0.5 tablets (2.5 mg total) by mouth daily. 45 tablet 3  . Carbidopa-Levodopa ER (SINEMET CR) 25-100 MG tablet controlled release 2 pills 3 times per day 450 tablet 1  . furosemide (LASIX) 20 MG tablet TAKE 1 TABLET BY MOUTH DAILY. 90 tablet 3  . losartan (COZAAR) 25 MG tablet Take 1 tablet (25 mg total) by mouth daily. 90 tablet 2  . lovastatin (MEVACOR) 20 MG tablet Take 1 tablet (20 mg total) by mouth at bedtime. 90 tablet 0  . potassium chloride (KLOR-CON) 10 MEQ tablet TAKE 1 TABLET BY MOUTH DAILY. 90 tablet 1  . venlafaxine XR (EFFEXOR-XR) 150 MG 24 hr capsule TAKE 1 CAPSULE BY MOUTH DAILY WITH BREAKFAST. PATIENT NEEDS GET RF'S FROM PSYCHIATRIST I REFERRED HER TO AS WE HAVE DISCUSSED IN THE PAST MONTHS AGO 15 capsule 0  . HYDROcodone-acetaminophen (NORCO) 5-325 MG tablet Take 1  tablet by mouth every 6 (six) hours as needed for moderate pain. (Patient not taking: Reported on 08/24/2019) 12 tablet 0   No current facility-administered medications on file prior to visit.     Objective:  Objective  Physical Exam Vitals and nursing note reviewed.  Constitutional:      Appearance: She is well-developed.  HENT:     Head: Normocephalic and atraumatic.  Eyes:      Conjunctiva/sclera: Conjunctivae normal.  Neck:     Thyroid: No thyromegaly.     Vascular: No carotid bruit or JVD.  Cardiovascular:     Rate and Rhythm: Normal rate and regular rhythm.     Heart sounds: Normal heart sounds. No murmur.  Pulmonary:     Effort: Pulmonary effort is normal. No respiratory distress.     Breath sounds: Normal breath sounds. No wheezing or rales.  Chest:     Chest wall: No tenderness.  Musculoskeletal:     Cervical back: Normal range of motion and neck supple.  Neurological:     Mental Status: She is alert and oriented to person, place, and time.    BP 108/66 (BP Location: Left Arm, Patient Position: Sitting, Cuff Size: Normal)   Pulse 71   Temp (!) 97.3 F (36.3 C) (Temporal)   Resp 18   Ht 4\' 11"  (1.499 m)   Wt 131 lb 9.6 oz (59.7 kg)   LMP 07/19/1988   SpO2 97%   BMI 26.58 kg/m  Wt Readings from Last 3 Encounters:  09/17/19 131 lb 9.6 oz (59.7 kg)  08/24/19 132 lb 4.4 oz (60 kg)  08/13/19 133 lb (60.3 kg)     Lab Results  Component Value Date   WBC 7.3 08/24/2019   HGB 12.1 08/24/2019   HCT 38.9 08/24/2019   PLT 295 08/24/2019   GLUCOSE 85 09/17/2019   CHOL 166 09/17/2019   TRIG 95.0 09/17/2019   HDL 57.60 09/17/2019   LDLCALC 89 09/17/2019   ALT 3 09/17/2019   AST 15 09/17/2019   NA 141 09/17/2019   K 4.3 09/17/2019   CL 99 09/17/2019   CREATININE 0.92 09/17/2019   BUN 22 09/17/2019   CO2 34 (H) 09/17/2019   TSH 2.420 01/31/2019   INR 1.0 03/01/2017   HGBA1C 5.4 01/31/2019   MICROALBUR 0.2 01/16/2014    CT Head Wo Contrast  Result Date: 08/24/2019 CLINICAL DATA:  Headache posttraumatic EXAM: CT HEAD WITHOUT CONTRAST TECHNIQUE: Contiguous axial images were obtained from the base of the skull through the vertex without intravenous contrast. COMPARISON:  July 16, 2019 FINDINGS: Brain: No evidence of acute territorial infarction, hemorrhage, hydrocephalus,extra-axial collection or mass lesion/mass effect. There is dilatation  the ventricles and sulci consistent with age-related atrophy. Low-attenuation changes in the deep white matter consistent with small vessel ischemia. Vascular: No hyperdense vessel or unexpected calcification. Skull: The skull is intact. No fracture or focal lesion identified. Sinuses/Orbits: The visualized paranasal sinuses and mastoid air cells are clear. The orbits and globes intact. Other: None IMPRESSION: No acute intracranial abnormality. Findings consistent with age related atrophy and chronic small vessel ischemia Electronically Signed   By: Prudencio Pair M.D.   On: 08/24/2019 19:09   DG Knee Complete 4 Views Right  Result Date: 08/24/2019 CLINICAL DATA:  Status post fall. EXAM: RIGHT KNEE - COMPLETE 4+ VIEW COMPARISON:  None. FINDINGS: There is marked severity narrowing of the medial and lateral femorotibial compartments. Marked severity narrowing of the patellofemoral articulation is also seen. Normal visualized  distal femur. Normal visualized proximal tibia and fibula. Normal proximal tibiofibular articulation. Mild to moderate severity soft tissue swelling is seen anterior to the right patella. IMPRESSION: 1. Marked severity degenerative changes. 2. Mild to moderate severity soft tissue swelling anterior to the right patella without evidence of acute osseous abnormality. Electronically Signed   By: Virgina Norfolk M.D.   On: 08/24/2019 18:03     Assessment & Plan:  Plan  I am having [place] B. Neubauer maintain her aspirin EC, bisoprolol, furosemide, losartan, HYDROcodone-acetaminophen, potassium chloride, Carbidopa-Levodopa ER, lovastatin, venlafaxine XR, and Acetaminophen.  No orders of the defined types were placed in this encounter.   Problem List Items Addressed This Visit      Unprioritized   Anxiety state    con't effexor  Stable       Chronic combined systolic and diastolic CHF (congestive heart failure) [facility])    Per cardiology Cont meds      Falls frequently    Pt  / ot ordered to be done at assisted living facility       Hyperlipidemia - Primary    Tolerating statin, encouraged heart healthy diet, avoid trans fats, minimize simple carbs and saturated fats. Increase exercise as tolerated      Relevant Orders   Lipid panel (Completed)   Comprehensive metabolic panel (Completed)   Parkinson disease ([place])    Per neuro con't meds      Thoracic aortic aneurysm without rupture ([place]) (Chronic)    Per CTS         Follow-up: Return in about 3 months (around 12/18/2019), or if symptoms worsen or fail to improve, for hypertension, hyperlipidemia.  Ann Held, DO

## 2019-09-17 NOTE — Assessment & Plan Note (Signed)
con't effexor  Stable  

## 2019-09-17 NOTE — Patient Instructions (Signed)
DASH Eating Plan DASH stands for "Dietary Approaches to Stop Hypertension." The DASH eating plan is a healthy eating plan that has been shown to reduce high blood pressure (hypertension). It may also reduce your risk for type 2 diabetes, heart disease, and stroke. The DASH eating plan may also help with weight loss. What are tips for following this plan?  General guidelines  Avoid eating more than 2,300 mg (milligrams) of salt (sodium) a day. If you have hypertension, you may need to reduce your sodium intake to 1,500 mg a day.  Limit alcohol intake to no more than 1 drink a day for nonpregnant women and 2 drinks a day for men. One drink equals 12 oz of beer, 5 oz of wine, or 1 oz of hard liquor.  Work with your health care provider to maintain a healthy body weight or to lose weight. Ask what an ideal weight is for you.  Get at least 30 minutes of exercise that causes your heart to beat faster (aerobic exercise) most days of the week. Activities may include walking, swimming, or biking.  Work with your health care provider or diet and nutrition specialist (dietitian) to adjust your eating plan to your individual calorie needs. Reading food labels   Check food labels for the amount of sodium per serving. Choose foods with less than 5 percent of the Daily Value of sodium. Generally, foods with less than 300 mg of sodium per serving fit into this eating plan.  To find whole grains, look for the word "whole" as the first word in the ingredient list. Shopping  Buy products labeled as "low-sodium" or "no salt added."  Buy fresh foods. Avoid canned foods and premade or frozen meals. Cooking  Avoid adding salt when cooking. Use salt-free seasonings or herbs instead of table salt or sea salt. Check with your health care provider or pharmacist before using salt substitutes.  Do not fry foods. Cook foods using healthy methods such as baking, boiling, grilling, and broiling instead.  Cook with  heart-healthy oils, such as olive, canola, soybean, or sunflower oil. Meal planning  Eat a balanced diet that includes: ? 5 or more servings of fruits and vegetables each day. At each meal, try to fill half of your plate with fruits and vegetables. ? Up to 6-8 servings of whole grains each day. ? Less than 6 oz of lean meat, poultry, or fish each day. A 3-oz serving of meat is about the same size as a deck of cards. One egg equals 1 oz. ? 2 servings of low-fat dairy each day. ? A serving of nuts, seeds, or beans 5 times each week. ? Heart-healthy fats. Healthy fats called Omega-3 fatty acids are found in foods such as flaxseeds and coldwater fish, like sardines, salmon, and mackerel.  Limit how much you eat of the following: ? Canned or prepackaged foods. ? Food that is high in trans fat, such as fried foods. ? Food that is high in saturated fat, such as fatty meat. ? Sweets, desserts, sugary drinks, and other foods with added sugar. ? Full-fat dairy products.  Do not salt foods before eating.  Try to eat at least 2 vegetarian meals each week.  Eat more home-cooked food and less restaurant, buffet, and fast food.  When eating at a restaurant, ask that your food be prepared with less salt or no salt, if possible. What foods are recommended? The items listed may not be a complete list. Talk with your dietitian about   what dietary choices are best for you. Grains Whole-grain or whole-wheat bread. Whole-grain or whole-wheat pasta. Brown rice. Oatmeal. Quinoa. Bulgur. Whole-grain and low-sodium cereals. Pita bread. Low-fat, low-sodium crackers. Whole-wheat flour tortillas. Vegetables Fresh or frozen vegetables (raw, steamed, roasted, or grilled). Low-sodium or reduced-sodium tomato and vegetable juice. Low-sodium or reduced-sodium tomato sauce and tomato paste. Low-sodium or reduced-sodium canned vegetables. Fruits All fresh, dried, or frozen fruit. Canned fruit in natural juice (without  added sugar). Meat and other protein foods Skinless chicken or turkey. Ground chicken or turkey. Pork with fat trimmed off. Fish and seafood. Egg whites. Dried beans, peas, or lentils. Unsalted nuts, nut butters, and seeds. Unsalted canned beans. Lean cuts of beef with fat trimmed off. Low-sodium, lean deli meat. Dairy Low-fat (1%) or fat-free (skim) milk. Fat-free, low-fat, or reduced-fat cheeses. Nonfat, low-sodium ricotta or cottage cheese. Low-fat or nonfat yogurt. Low-fat, low-sodium cheese. Fats and oils Soft margarine without trans fats. Vegetable oil. Low-fat, reduced-fat, or light mayonnaise and salad dressings (reduced-sodium). Canola, safflower, olive, soybean, and sunflower oils. Avocado. Seasoning and other foods Herbs. Spices. Seasoning mixes without salt. Unsalted popcorn and pretzels. Fat-free sweets. What foods are not recommended? The items listed may not be a complete list. Talk with your dietitian about what dietary choices are best for you. Grains Baked goods made with fat, such as croissants, muffins, or some breads. Dry pasta or rice meal packs. Vegetables Creamed or fried vegetables. Vegetables in a cheese sauce. Regular canned vegetables (not low-sodium or reduced-sodium). Regular canned tomato sauce and paste (not low-sodium or reduced-sodium). Regular tomato and vegetable juice (not low-sodium or reduced-sodium). Pickles. Olives. Fruits Canned fruit in a light or heavy syrup. Fried fruit. Fruit in cream or butter sauce. Meat and other protein foods Fatty cuts of meat. Ribs. Fried meat. Bacon. Sausage. Bologna and other processed lunch meats. Salami. Fatback. Hotdogs. Bratwurst. Salted nuts and seeds. Canned beans with added salt. Canned or smoked fish. Whole eggs or egg yolks. Chicken or turkey with skin. Dairy Whole or 2% milk, cream, and half-and-half. Whole or full-fat cream cheese. Whole-fat or sweetened yogurt. Full-fat cheese. Nondairy creamers. Whipped toppings.  Processed cheese and cheese spreads. Fats and oils Butter. Stick margarine. Lard. Shortening. Ghee. Bacon fat. Tropical oils, such as coconut, palm kernel, or palm oil. Seasoning and other foods Salted popcorn and pretzels. Onion salt, garlic salt, seasoned salt, table salt, and sea salt. Worcestershire sauce. Tartar sauce. Barbecue sauce. Teriyaki sauce. Soy sauce, including reduced-sodium. Steak sauce. Canned and packaged gravies. Fish sauce. Oyster sauce. Cocktail sauce. Horseradish that you find on the shelf. Ketchup. Mustard. Meat flavorings and tenderizers. Bouillon cubes. Hot sauce and Tabasco sauce. Premade or packaged marinades. Premade or packaged taco seasonings. Relishes. Regular salad dressings. Where to find more information:  National Heart, Lung, and Blood Institute: www.nhlbi.nih.gov  American Heart Association: www.heart.org Summary  The DASH eating plan is a healthy eating plan that has been shown to reduce high blood pressure (hypertension). It may also reduce your risk for type 2 diabetes, heart disease, and stroke.  With the DASH eating plan, you should limit salt (sodium) intake to 2,300 mg a day. If you have hypertension, you may need to reduce your sodium intake to 1,500 mg a day.  When on the DASH eating plan, aim to eat more fresh fruits and vegetables, whole grains, lean proteins, low-fat dairy, and heart-healthy fats.  Work with your health care provider or diet and nutrition specialist (dietitian) to adjust your eating plan to your   individual calorie needs. This information is not intended to replace advice given to you by your health care provider. Make sure you discuss any questions you have with your health care provider. Document Revised: 06/17/2017 Document Reviewed: 06/28/2016 Elsevier Patient Education  2020 [employer]  

## 2019-09-17 NOTE — Assessment & Plan Note (Signed)
Per cardiology Cont meds

## 2019-09-18 ENCOUNTER — Ambulatory Visit: Payer: Medicare Other | Admitting: Cardiovascular Disease

## 2019-09-18 DIAGNOSIS — M6281 Muscle weakness (generalized): Secondary | ICD-10-CM | POA: Diagnosis not present

## 2019-09-18 DIAGNOSIS — M25561 Pain in right knee: Secondary | ICD-10-CM | POA: Diagnosis not present

## 2019-09-18 DIAGNOSIS — M25562 Pain in left knee: Secondary | ICD-10-CM | POA: Diagnosis not present

## 2019-09-18 DIAGNOSIS — R2681 Unsteadiness on feet: Secondary | ICD-10-CM | POA: Diagnosis not present

## 2019-09-19 ENCOUNTER — Other Ambulatory Visit: Payer: Self-pay | Admitting: Family Medicine

## 2019-09-19 DIAGNOSIS — M6281 Muscle weakness (generalized): Secondary | ICD-10-CM | POA: Diagnosis not present

## 2019-09-19 DIAGNOSIS — F411 Generalized anxiety disorder: Secondary | ICD-10-CM

## 2019-09-19 DIAGNOSIS — R2681 Unsteadiness on feet: Secondary | ICD-10-CM | POA: Diagnosis not present

## 2019-09-19 DIAGNOSIS — F329 Major depressive disorder, single episode, unspecified: Secondary | ICD-10-CM

## 2019-09-19 DIAGNOSIS — M25562 Pain in left knee: Secondary | ICD-10-CM | POA: Diagnosis not present

## 2019-09-19 DIAGNOSIS — R498 Other voice and resonance disorders: Secondary | ICD-10-CM | POA: Diagnosis not present

## 2019-09-19 DIAGNOSIS — F32A Depression, unspecified: Secondary | ICD-10-CM

## 2019-09-19 DIAGNOSIS — R41841 Cognitive communication deficit: Secondary | ICD-10-CM | POA: Diagnosis not present

## 2019-09-19 DIAGNOSIS — M25561 Pain in right knee: Secondary | ICD-10-CM | POA: Diagnosis not present

## 2019-09-19 NOTE — Telephone Encounter (Signed)
Pt just re established. It looks like previous provider advised pt to contact therapist for more refills. Please advise

## 2019-09-20 DIAGNOSIS — M6281 Muscle weakness (generalized): Secondary | ICD-10-CM | POA: Diagnosis not present

## 2019-09-20 DIAGNOSIS — M25561 Pain in right knee: Secondary | ICD-10-CM | POA: Diagnosis not present

## 2019-09-20 DIAGNOSIS — M25562 Pain in left knee: Secondary | ICD-10-CM | POA: Diagnosis not present

## 2019-09-20 DIAGNOSIS — R498 Other voice and resonance disorders: Secondary | ICD-10-CM | POA: Diagnosis not present

## 2019-09-20 DIAGNOSIS — R41841 Cognitive communication deficit: Secondary | ICD-10-CM | POA: Diagnosis not present

## 2019-09-20 DIAGNOSIS — R2681 Unsteadiness on feet: Secondary | ICD-10-CM | POA: Diagnosis not present

## 2019-09-21 DIAGNOSIS — M25561 Pain in right knee: Secondary | ICD-10-CM | POA: Diagnosis not present

## 2019-09-21 DIAGNOSIS — M6281 Muscle weakness (generalized): Secondary | ICD-10-CM | POA: Diagnosis not present

## 2019-09-21 DIAGNOSIS — M25562 Pain in left knee: Secondary | ICD-10-CM | POA: Diagnosis not present

## 2019-09-21 DIAGNOSIS — R2681 Unsteadiness on feet: Secondary | ICD-10-CM | POA: Diagnosis not present

## 2019-09-23 DIAGNOSIS — M25561 Pain in right knee: Secondary | ICD-10-CM | POA: Diagnosis not present

## 2019-09-23 DIAGNOSIS — M25562 Pain in left knee: Secondary | ICD-10-CM | POA: Diagnosis not present

## 2019-09-23 DIAGNOSIS — M6281 Muscle weakness (generalized): Secondary | ICD-10-CM | POA: Diagnosis not present

## 2019-09-23 DIAGNOSIS — R2681 Unsteadiness on feet: Secondary | ICD-10-CM | POA: Diagnosis not present

## 2019-09-24 DIAGNOSIS — R498 Other voice and resonance disorders: Secondary | ICD-10-CM | POA: Diagnosis not present

## 2019-09-24 DIAGNOSIS — R2681 Unsteadiness on feet: Secondary | ICD-10-CM | POA: Diagnosis not present

## 2019-09-24 DIAGNOSIS — M6281 Muscle weakness (generalized): Secondary | ICD-10-CM | POA: Diagnosis not present

## 2019-09-24 DIAGNOSIS — R41841 Cognitive communication deficit: Secondary | ICD-10-CM | POA: Diagnosis not present

## 2019-09-25 DIAGNOSIS — M25561 Pain in right knee: Secondary | ICD-10-CM | POA: Diagnosis not present

## 2019-09-25 DIAGNOSIS — M25562 Pain in left knee: Secondary | ICD-10-CM | POA: Diagnosis not present

## 2019-09-25 DIAGNOSIS — R2681 Unsteadiness on feet: Secondary | ICD-10-CM | POA: Diagnosis not present

## 2019-09-25 DIAGNOSIS — R498 Other voice and resonance disorders: Secondary | ICD-10-CM | POA: Diagnosis not present

## 2019-09-25 DIAGNOSIS — M6281 Muscle weakness (generalized): Secondary | ICD-10-CM | POA: Diagnosis not present

## 2019-09-25 DIAGNOSIS — R41841 Cognitive communication deficit: Secondary | ICD-10-CM | POA: Diagnosis not present

## 2019-09-26 ENCOUNTER — Ambulatory Visit: Payer: Medicare Other | Admitting: Psychiatry

## 2019-09-26 ENCOUNTER — Other Ambulatory Visit: Payer: Self-pay

## 2019-09-26 DIAGNOSIS — M25561 Pain in right knee: Secondary | ICD-10-CM | POA: Diagnosis not present

## 2019-09-26 DIAGNOSIS — R2681 Unsteadiness on feet: Secondary | ICD-10-CM | POA: Diagnosis not present

## 2019-09-26 DIAGNOSIS — M6281 Muscle weakness (generalized): Secondary | ICD-10-CM | POA: Diagnosis not present

## 2019-09-26 DIAGNOSIS — M25562 Pain in left knee: Secondary | ICD-10-CM | POA: Diagnosis not present

## 2019-09-27 DIAGNOSIS — M6281 Muscle weakness (generalized): Secondary | ICD-10-CM | POA: Diagnosis not present

## 2019-09-27 DIAGNOSIS — R498 Other voice and resonance disorders: Secondary | ICD-10-CM | POA: Diagnosis not present

## 2019-09-27 DIAGNOSIS — R2681 Unsteadiness on feet: Secondary | ICD-10-CM | POA: Diagnosis not present

## 2019-09-27 DIAGNOSIS — R41841 Cognitive communication deficit: Secondary | ICD-10-CM | POA: Diagnosis not present

## 2019-09-28 DIAGNOSIS — R2681 Unsteadiness on feet: Secondary | ICD-10-CM | POA: Diagnosis not present

## 2019-09-28 DIAGNOSIS — M25562 Pain in left knee: Secondary | ICD-10-CM | POA: Diagnosis not present

## 2019-09-28 DIAGNOSIS — M25561 Pain in right knee: Secondary | ICD-10-CM | POA: Diagnosis not present

## 2019-09-28 DIAGNOSIS — M6281 Muscle weakness (generalized): Secondary | ICD-10-CM | POA: Diagnosis not present

## 2019-10-01 DIAGNOSIS — M25562 Pain in left knee: Secondary | ICD-10-CM | POA: Diagnosis not present

## 2019-10-01 DIAGNOSIS — R2681 Unsteadiness on feet: Secondary | ICD-10-CM | POA: Diagnosis not present

## 2019-10-01 DIAGNOSIS — M25561 Pain in right knee: Secondary | ICD-10-CM | POA: Diagnosis not present

## 2019-10-01 DIAGNOSIS — R41841 Cognitive communication deficit: Secondary | ICD-10-CM | POA: Diagnosis not present

## 2019-10-01 DIAGNOSIS — R498 Other voice and resonance disorders: Secondary | ICD-10-CM | POA: Diagnosis not present

## 2019-10-01 DIAGNOSIS — M6281 Muscle weakness (generalized): Secondary | ICD-10-CM | POA: Diagnosis not present

## 2019-10-02 DIAGNOSIS — R2681 Unsteadiness on feet: Secondary | ICD-10-CM | POA: Diagnosis not present

## 2019-10-02 DIAGNOSIS — M25562 Pain in left knee: Secondary | ICD-10-CM | POA: Diagnosis not present

## 2019-10-02 DIAGNOSIS — M25561 Pain in right knee: Secondary | ICD-10-CM | POA: Diagnosis not present

## 2019-10-02 DIAGNOSIS — M6281 Muscle weakness (generalized): Secondary | ICD-10-CM | POA: Diagnosis not present

## 2019-10-03 DIAGNOSIS — R2681 Unsteadiness on feet: Secondary | ICD-10-CM | POA: Diagnosis not present

## 2019-10-03 DIAGNOSIS — M25562 Pain in left knee: Secondary | ICD-10-CM | POA: Diagnosis not present

## 2019-10-03 DIAGNOSIS — M6281 Muscle weakness (generalized): Secondary | ICD-10-CM | POA: Diagnosis not present

## 2019-10-03 DIAGNOSIS — R41841 Cognitive communication deficit: Secondary | ICD-10-CM | POA: Diagnosis not present

## 2019-10-03 DIAGNOSIS — M25561 Pain in right knee: Secondary | ICD-10-CM | POA: Diagnosis not present

## 2019-10-03 DIAGNOSIS — R498 Other voice and resonance disorders: Secondary | ICD-10-CM | POA: Diagnosis not present

## 2019-10-04 DIAGNOSIS — R498 Other voice and resonance disorders: Secondary | ICD-10-CM | POA: Diagnosis not present

## 2019-10-04 DIAGNOSIS — R41841 Cognitive communication deficit: Secondary | ICD-10-CM | POA: Diagnosis not present

## 2019-10-04 DIAGNOSIS — R2681 Unsteadiness on feet: Secondary | ICD-10-CM | POA: Diagnosis not present

## 2019-10-04 DIAGNOSIS — M25561 Pain in right knee: Secondary | ICD-10-CM | POA: Diagnosis not present

## 2019-10-04 DIAGNOSIS — M25562 Pain in left knee: Secondary | ICD-10-CM | POA: Diagnosis not present

## 2019-10-04 DIAGNOSIS — M6281 Muscle weakness (generalized): Secondary | ICD-10-CM | POA: Diagnosis not present

## 2019-10-05 DIAGNOSIS — R2681 Unsteadiness on feet: Secondary | ICD-10-CM | POA: Diagnosis not present

## 2019-10-05 DIAGNOSIS — M25561 Pain in right knee: Secondary | ICD-10-CM | POA: Diagnosis not present

## 2019-10-05 DIAGNOSIS — M25562 Pain in left knee: Secondary | ICD-10-CM | POA: Diagnosis not present

## 2019-10-05 DIAGNOSIS — M6281 Muscle weakness (generalized): Secondary | ICD-10-CM | POA: Diagnosis not present

## 2019-10-08 DIAGNOSIS — R2681 Unsteadiness on feet: Secondary | ICD-10-CM | POA: Diagnosis not present

## 2019-10-08 DIAGNOSIS — R41841 Cognitive communication deficit: Secondary | ICD-10-CM | POA: Diagnosis not present

## 2019-10-08 DIAGNOSIS — M25562 Pain in left knee: Secondary | ICD-10-CM | POA: Diagnosis not present

## 2019-10-08 DIAGNOSIS — M6281 Muscle weakness (generalized): Secondary | ICD-10-CM | POA: Diagnosis not present

## 2019-10-08 DIAGNOSIS — M25561 Pain in right knee: Secondary | ICD-10-CM | POA: Diagnosis not present

## 2019-10-08 DIAGNOSIS — R498 Other voice and resonance disorders: Secondary | ICD-10-CM | POA: Diagnosis not present

## 2019-10-09 DIAGNOSIS — M25561 Pain in right knee: Secondary | ICD-10-CM | POA: Diagnosis not present

## 2019-10-09 DIAGNOSIS — R41841 Cognitive communication deficit: Secondary | ICD-10-CM | POA: Diagnosis not present

## 2019-10-09 DIAGNOSIS — R498 Other voice and resonance disorders: Secondary | ICD-10-CM | POA: Diagnosis not present

## 2019-10-09 DIAGNOSIS — M25562 Pain in left knee: Secondary | ICD-10-CM | POA: Diagnosis not present

## 2019-10-09 DIAGNOSIS — R2681 Unsteadiness on feet: Secondary | ICD-10-CM | POA: Diagnosis not present

## 2019-10-09 DIAGNOSIS — M6281 Muscle weakness (generalized): Secondary | ICD-10-CM | POA: Diagnosis not present

## 2019-10-10 DIAGNOSIS — R2681 Unsteadiness on feet: Secondary | ICD-10-CM | POA: Diagnosis not present

## 2019-10-10 DIAGNOSIS — M25561 Pain in right knee: Secondary | ICD-10-CM | POA: Diagnosis not present

## 2019-10-10 DIAGNOSIS — M6281 Muscle weakness (generalized): Secondary | ICD-10-CM | POA: Diagnosis not present

## 2019-10-10 DIAGNOSIS — M25562 Pain in left knee: Secondary | ICD-10-CM | POA: Diagnosis not present

## 2019-10-11 DIAGNOSIS — R2681 Unsteadiness on feet: Secondary | ICD-10-CM | POA: Diagnosis not present

## 2019-10-11 DIAGNOSIS — M25561 Pain in right knee: Secondary | ICD-10-CM | POA: Diagnosis not present

## 2019-10-11 DIAGNOSIS — M6281 Muscle weakness (generalized): Secondary | ICD-10-CM | POA: Diagnosis not present

## 2019-10-11 DIAGNOSIS — M25562 Pain in left knee: Secondary | ICD-10-CM | POA: Diagnosis not present

## 2019-10-12 DIAGNOSIS — M25562 Pain in left knee: Secondary | ICD-10-CM | POA: Diagnosis not present

## 2019-10-12 DIAGNOSIS — M25561 Pain in right knee: Secondary | ICD-10-CM | POA: Diagnosis not present

## 2019-10-12 DIAGNOSIS — R2681 Unsteadiness on feet: Secondary | ICD-10-CM | POA: Diagnosis not present

## 2019-10-12 DIAGNOSIS — M6281 Muscle weakness (generalized): Secondary | ICD-10-CM | POA: Diagnosis not present

## 2019-10-15 DIAGNOSIS — R2681 Unsteadiness on feet: Secondary | ICD-10-CM | POA: Diagnosis not present

## 2019-10-15 DIAGNOSIS — R41841 Cognitive communication deficit: Secondary | ICD-10-CM | POA: Diagnosis not present

## 2019-10-15 DIAGNOSIS — M25561 Pain in right knee: Secondary | ICD-10-CM | POA: Diagnosis not present

## 2019-10-15 DIAGNOSIS — M6281 Muscle weakness (generalized): Secondary | ICD-10-CM | POA: Diagnosis not present

## 2019-10-15 DIAGNOSIS — M25562 Pain in left knee: Secondary | ICD-10-CM | POA: Diagnosis not present

## 2019-10-15 DIAGNOSIS — R498 Other voice and resonance disorders: Secondary | ICD-10-CM | POA: Diagnosis not present

## 2019-10-16 DIAGNOSIS — M6281 Muscle weakness (generalized): Secondary | ICD-10-CM | POA: Diagnosis not present

## 2019-10-16 DIAGNOSIS — R2681 Unsteadiness on feet: Secondary | ICD-10-CM | POA: Diagnosis not present

## 2019-10-17 DIAGNOSIS — R2681 Unsteadiness on feet: Secondary | ICD-10-CM | POA: Diagnosis not present

## 2019-10-17 DIAGNOSIS — M6281 Muscle weakness (generalized): Secondary | ICD-10-CM | POA: Diagnosis not present

## 2019-10-17 DIAGNOSIS — R498 Other voice and resonance disorders: Secondary | ICD-10-CM | POA: Diagnosis not present

## 2019-10-17 DIAGNOSIS — R41841 Cognitive communication deficit: Secondary | ICD-10-CM | POA: Diagnosis not present

## 2019-10-18 DIAGNOSIS — R2681 Unsteadiness on feet: Secondary | ICD-10-CM | POA: Diagnosis not present

## 2019-10-18 DIAGNOSIS — M25562 Pain in left knee: Secondary | ICD-10-CM | POA: Diagnosis not present

## 2019-10-18 DIAGNOSIS — M6281 Muscle weakness (generalized): Secondary | ICD-10-CM | POA: Diagnosis not present

## 2019-10-18 DIAGNOSIS — M25561 Pain in right knee: Secondary | ICD-10-CM | POA: Diagnosis not present

## 2019-10-19 ENCOUNTER — Other Ambulatory Visit: Payer: Self-pay | Admitting: Family Medicine

## 2019-10-19 DIAGNOSIS — F411 Generalized anxiety disorder: Secondary | ICD-10-CM

## 2019-10-19 DIAGNOSIS — F32A Depression, unspecified: Secondary | ICD-10-CM

## 2019-10-19 DIAGNOSIS — M6281 Muscle weakness (generalized): Secondary | ICD-10-CM | POA: Diagnosis not present

## 2019-10-19 DIAGNOSIS — R2681 Unsteadiness on feet: Secondary | ICD-10-CM | POA: Diagnosis not present

## 2019-10-19 DIAGNOSIS — F329 Major depressive disorder, single episode, unspecified: Secondary | ICD-10-CM

## 2019-10-22 DIAGNOSIS — M25561 Pain in right knee: Secondary | ICD-10-CM | POA: Diagnosis not present

## 2019-10-22 DIAGNOSIS — M25562 Pain in left knee: Secondary | ICD-10-CM | POA: Diagnosis not present

## 2019-10-22 DIAGNOSIS — R2681 Unsteadiness on feet: Secondary | ICD-10-CM | POA: Diagnosis not present

## 2019-10-22 DIAGNOSIS — M6281 Muscle weakness (generalized): Secondary | ICD-10-CM | POA: Diagnosis not present

## 2019-10-23 DIAGNOSIS — R2681 Unsteadiness on feet: Secondary | ICD-10-CM | POA: Diagnosis not present

## 2019-10-23 DIAGNOSIS — M6281 Muscle weakness (generalized): Secondary | ICD-10-CM | POA: Diagnosis not present

## 2019-10-24 ENCOUNTER — Ambulatory Visit: Payer: Medicare Other | Admitting: Family Medicine

## 2019-10-24 ENCOUNTER — Other Ambulatory Visit: Payer: Self-pay

## 2019-10-24 DIAGNOSIS — R41841 Cognitive communication deficit: Secondary | ICD-10-CM | POA: Diagnosis not present

## 2019-10-24 DIAGNOSIS — R498 Other voice and resonance disorders: Secondary | ICD-10-CM | POA: Diagnosis not present

## 2019-10-24 DIAGNOSIS — M25562 Pain in left knee: Secondary | ICD-10-CM | POA: Diagnosis not present

## 2019-10-24 DIAGNOSIS — R2681 Unsteadiness on feet: Secondary | ICD-10-CM | POA: Diagnosis not present

## 2019-10-24 DIAGNOSIS — M6281 Muscle weakness (generalized): Secondary | ICD-10-CM | POA: Diagnosis not present

## 2019-10-24 DIAGNOSIS — M25561 Pain in right knee: Secondary | ICD-10-CM | POA: Diagnosis not present

## 2019-10-25 ENCOUNTER — Encounter: Payer: Self-pay | Admitting: Family Medicine

## 2019-10-25 ENCOUNTER — Ambulatory Visit: Payer: Medicare Other | Admitting: Neurology

## 2019-10-25 ENCOUNTER — Ambulatory Visit: Payer: Medicare Other | Admitting: Family Medicine

## 2019-10-25 VITALS — BP 138/82 | HR 83 | Temp 97.9°F | Resp 18 | Ht 59.0 in | Wt 126.5 lb

## 2019-10-25 DIAGNOSIS — M6281 Muscle weakness (generalized): Secondary | ICD-10-CM | POA: Diagnosis not present

## 2019-10-25 DIAGNOSIS — R29818 Other symptoms and signs involving the nervous system: Secondary | ICD-10-CM | POA: Diagnosis not present

## 2019-10-25 DIAGNOSIS — R2681 Unsteadiness on feet: Secondary | ICD-10-CM | POA: Diagnosis not present

## 2019-10-25 DIAGNOSIS — F419 Anxiety disorder, unspecified: Secondary | ICD-10-CM | POA: Diagnosis not present

## 2019-10-25 DIAGNOSIS — R413 Other amnesia: Secondary | ICD-10-CM | POA: Insufficient documentation

## 2019-10-25 DIAGNOSIS — M25562 Pain in left knee: Secondary | ICD-10-CM | POA: Diagnosis not present

## 2019-10-25 DIAGNOSIS — G20 Parkinson's disease: Secondary | ICD-10-CM

## 2019-10-25 DIAGNOSIS — R8290 Unspecified abnormal findings in urine: Secondary | ICD-10-CM

## 2019-10-25 DIAGNOSIS — M25561 Pain in right knee: Secondary | ICD-10-CM | POA: Diagnosis not present

## 2019-10-25 LAB — POC URINALSYSI DIPSTICK (AUTOMATED)
Bilirubin, UA: POSITIVE
Blood, UA: NEGATIVE
Glucose, UA: NEGATIVE
Nitrite, UA: NEGATIVE
Protein, UA: POSITIVE — AB
Spec Grav, UA: 1.02 (ref 1.010–1.025)
Urobilinogen, UA: 0.2 E.U./dL
pH, UA: 6 (ref 5.0–8.0)

## 2019-10-25 MED ORDER — ALPRAZOLAM 0.5 MG PO TABS: ORAL_TABLET | ORAL | 0 refills | Status: DC

## 2019-10-25 MED ORDER — VENLAFAXINE HCL ER 75 MG PO CP24: 75.0000 mg | ORAL_CAPSULE | Freq: Every day | ORAL | 0 refills | Status: DC

## 2019-10-25 NOTE — Progress Notes (Signed)
Patient ID: Elizabeth Mccullough, female    DOB: 06-15-1941  Age: 79 y.o. MRN: [id number]    Subjective:  Subjective  HPI Elizabeth Mccullough presents for c/o headaches and strong odor in urine  Pt is in indp living and is constantly calling daughter saying she does not feel well but can not tell us exactly what is wrong.  She c/o headaches a lot and her daughter states she has a strong odor to her urine.   Review of Systems  Constitutional: Negative for appetite change, diaphoresis, fatigue and unexpected weight change.  Eyes: Negative for pain, redness and visual disturbance.  Respiratory: Negative for cough, chest tightness, shortness of breath and wheezing.   Cardiovascular: Negative for chest pain, palpitations and leg swelling.  Endocrine: Negative for cold intolerance, heat intolerance, polydipsia, polyphagia and polyuria.  Genitourinary: Negative for difficulty urinating, dysuria and frequency.  Neurological: Negative for dizziness, light-headedness, numbness and headaches.    History Past Medical History:  Diagnosis Date  . AAA (abdominal aortic aneurysm) ([place])   . Carotid artery disease ([place]) 01/12/2017   Carotid US 6/18: Bilat < 50%; repeat in 12/2000  . Chronic combined systolic and diastolic CHF (congestive heart failure) ([place]) 03/01/2017   NICM // [place] 8/18: normal coronary arteries /  Echo 8/18: EF 20-25, mod AI, mod MR, PASP 32 // Echo 1/19: EF 55-60, no RWMA, Gr 1 DD, mod AI, mild MR, PASP 32  . Hyperlipidemia   . Parkinson's disease [facility])     She has a past surgical history that includes Hammer toe surgery (04/10/02); Breast cyst aspiration (1965); Nasal septum surgery (1980); Knee surgery (Right, 10/17/15); RIGHT/LEFT HEART CATH AND CORONARY ANGIOGRAPHY (N/A, 03/03/2017); and THORACIC AORTOGRAM (N/A, 03/03/2017).   Her family history includes Breast cancer in an other family member; Cancer in her sister; Cancer (age of onset: 43) in her sister; Colon cancer in an other  family member; Diabetes in her daughter and sister; Healthy in her daughter; Heart disease in her mother; Hyperlipidemia in her brother, sister, and sister; Hypertension in her brother; Prostate cancer in her son; Stomach cancer in her daughter; Stroke in her brother.She reports that she has never smoked. She has never used smokeless tobacco. She reports current alcohol use. She reports that she does not use drugs.  Current Outpatient Medications on File Prior to Visit  Medication Sig Dispense Refill  . Acetaminophen 325 MG CAPS Take 1-2 capsules by mouth every 8 (eight) hours as needed (moderate to severe pain). 30 capsule 0  . aspirin EC 81 MG tablet Take 1 tablet (81 mg total) by mouth daily.    . bisoprolol (ZEBETA) 5 MG tablet Take 0.5 tablets (2.5 mg total) by mouth daily. 45 tablet 3  . Carbidopa-Levodopa ER (SINEMET CR) 25-100 MG tablet controlled release 2 pills 3 times per day 450 tablet 1  . furosemide (LASIX) 20 MG tablet TAKE 1 TABLET BY MOUTH DAILY. 90 tablet 3  . losartan (COZAAR) 25 MG tablet Take 1 tablet (25 mg total) by mouth daily. 90 tablet 2  . lovastatin (MEVACOR) 20 MG tablet Take 1 tablet (20 mg total) by mouth at bedtime. 90 tablet 0  . potassium chloride (KLOR-CON) 10 MEQ tablet TAKE 1 TABLET BY MOUTH DAILY. 90 tablet 1   No current facility-administered medications on file prior to visit.     Objective:  Objective  Physical Exam Vitals and nursing note reviewed.  Constitutional:      Appearance: She is well-developed.  HENT:  Head: Normocephalic and atraumatic.  Eyes:     Conjunctiva/sclera: Conjunctivae normal.  Neck:     Thyroid: No thyromegaly.     Vascular: No carotid bruit or JVD.  Cardiovascular:     Rate and Rhythm: Normal rate and regular rhythm.     Heart sounds: Normal heart sounds. No murmur.  Pulmonary:     Effort: Pulmonary effort is normal. No respiratory distress.     Breath sounds: Normal breath sounds. No wheezing or rales.  Chest:      Chest wall: No tenderness.  Musculoskeletal:     Cervical back: Normal range of motion and neck supple.  Neurological:     Mental Status: She is alert. She is confused.  Psychiatric:     Comments: Pt had been manipulative with family in order to get them to visit her  Calling family at working and faking a rx drug overdose when she really just put her meds back in the bottle D/w daughter assisted living or memory care--- they had an awful experience with it so took her out and put her in indp living     BP 138/82 (BP Location: Right Arm, Patient Position: Sitting, Cuff Size: Normal)   Pulse 83   Temp 97.9 F (36.6 C) (Temporal)   Resp 18   Ht 4\' 11"  (1.499 m)   Wt 126 lb 8 oz (57.4 kg)   LMP 07/19/1988   SpO2 97%   BMI 25.55 kg/m  Wt Readings from Last 3 Encounters:  10/25/19 126 lb 8 oz (57.4 kg)  09/17/19 131 lb 9.6 oz (59.7 kg)  08/24/19 132 lb 4.4 oz (60 kg)     Lab Results  Component Value Date   WBC 7.3 08/24/2019   HGB 12.1 08/24/2019   HCT 38.9 08/24/2019   PLT 295 08/24/2019   GLUCOSE 85 09/17/2019   CHOL 166 09/17/2019   TRIG 95.0 09/17/2019   HDL 57.60 09/17/2019   LDLCALC 89 09/17/2019   ALT 3 09/17/2019   AST 15 09/17/2019   NA 141 09/17/2019   K 4.3 09/17/2019   CL 99 09/17/2019   CREATININE 0.92 09/17/2019   BUN 22 09/17/2019   CO2 34 (H) 09/17/2019   TSH 2.420 01/31/2019   INR 1.0 03/01/2017   HGBA1C 5.4 01/31/2019   MICROALBUR 0.2 01/16/2014    CT Head Wo Contrast  Result Date: 08/24/2019 CLINICAL DATA:  Headache posttraumatic EXAM: CT HEAD WITHOUT CONTRAST TECHNIQUE: Contiguous axial images were obtained from the base of the skull through the vertex without intravenous contrast. COMPARISON:  July 16, 2019 FINDINGS: Brain: No evidence of acute territorial infarction, hemorrhage, hydrocephalus,extra-axial collection or mass lesion/mass effect. There is dilatation the ventricles and sulci consistent with age-related atrophy. Low-attenuation  changes in the deep white matter consistent with small vessel ischemia. Vascular: No hyperdense vessel or unexpected calcification. Skull: The skull is intact. No fracture or focal lesion identified. Sinuses/Orbits: The visualized paranasal sinuses and mastoid air cells are clear. The orbits and globes intact. Other: None IMPRESSION: No acute intracranial abnormality. Findings consistent with age related atrophy and chronic small vessel ischemia Electronically Signed   By: Prudencio Pair M.D.   On: 08/24/2019 19:09   DG Knee Complete 4 Views Right  Result Date: 08/24/2019 CLINICAL DATA:  Status post fall. EXAM: RIGHT KNEE - COMPLETE 4+ VIEW COMPARISON:  None. FINDINGS: There is marked severity narrowing of the medial and lateral femorotibial compartments. Marked severity narrowing of the patellofemoral articulation is also seen. Normal  visualized distal femur. Normal visualized proximal tibia and fibula. Normal proximal tibiofibular articulation. Mild to moderate severity soft tissue swelling is seen anterior to the right patella. IMPRESSION: 1. Marked severity degenerative changes. 2. Mild to moderate severity soft tissue swelling anterior to the right patella without evidence of acute osseous abnormality. Electronically Signed   By: Virgina Norfolk M.D.   On: 08/24/2019 18:03     Assessment & Plan:  Plan  I have discontinued Zyana B. Peer's HYDROcodone-acetaminophen and venlafaxine XR. I am also having her start on ALPRAZolam and venlafaxine XR. Additionally, I am having her maintain her aspirin EC, bisoprolol, furosemide, losartan, potassium chloride, Carbidopa-Levodopa ER, lovastatin, and Acetaminophen.  Meds ordered this encounter  Medications  . ALPRAZolam (XANAX) 0.5 MG tablet    Sig: 1 po 30 prior to procedure prn and bring to procedure    Dispense:  10 tablet    Refill:  0  . venlafaxine XR (EFFEXOR XR) 75 MG 24 hr capsule    Sig: Take 1 capsule (75 mg total) by mouth daily with  breakfast.    Dispense:  30 capsule    Refill:  0    Problem List Items Addressed This Visit      Unprioritized   Abnormal urine odor    Pt was unble to void in office  Will send home with cup  Pt needs to drink more water       Relevant Orders   POCT Urinalysis Dipstick (Automated) (Completed)   Urine Culture   Anxiety    Will start to wean off meds Dec to effexor 75 mg daily for at least a month      Relevant Medications   ALPRAZolam (XANAX) 0.5 MG tablet   venlafaxine XR (EFFEXOR XR) 75 MG 24 hr capsule   Memory deficit - Primary    Refer to neuro psych  F/u neuro  Check MRI brain      Relevant Orders   Ambulatory referral to Neuropsychology   Parkinson disease [facility])    F/u neuro with family member       Other Visit Diagnoses    Other symptoms and signs involving the nervous system       Relevant Orders   MR Brain Wo Contrast      Follow-up: Return if symptoms worsen or fail to improve.  Ann Held, DO

## 2019-10-25 NOTE — Patient Instructions (Signed)
Confusion °Confusion is the inability to think with the usual speed or clarity. People who are confused often describe their thinking as cloudy or unclear. Confusion can also include feeling disoriented. This means you are unaware of where you are or who you are. You may also not know the date or time. When confused, you may have difficulty remembering, paying attention, or making decisions. Some people also act aggressively when they are confused. °In some cases, confusion may come on quickly. In other cases, it may develop slowly over time. How quickly confusion comes on depends on the cause. °Confusion may be caused by: °· Head injury (concussion). °· Seizures. °· Stroke. °· Fever. °· Brain tumor. °· Decrease in brain function due to a vascular or neurologic condition (dementia). °· Emotions, like rage or terror. °· Inability to know what is real and what is not (hallucinations). °· Infections, such as a urinary tract infection (UTI). °· Using too much alcohol, drugs, or medicines. °· Loss of fluid (dehydration) or an imbalance of salts in the body (electrolytes). °· Lack of sleep. °· Low blood sugar (diabetes). °· Low levels of oxygen. This comes from conditions such as chronic lung disorders. °· Side effects of medicines, or taking medicines that affect other medicines (drug interactions). °· Lack of certain nutrients, especially niacin, thiamine, vitamin C, or vitamin B. °· Sudden drop in body temperature (hypothermia). °· Change in routine, such as traveling or being hospitalized. °Follow these instructions at home: °Pay attention to your symptoms. Tell your health care provider about any changes or if you develop new symptoms. Follow these instructions to control or treat symptoms. Ask a family member or friend for help if needed. °Medicines °· Take over-the-counter and prescription medicines only as told by your health care provider. °· Ask your health care provider about changing or stopping any medicines  that may be causing your confusion. °· Avoid pain medicines or sleep medicines until you have fully recovered. °· Use a pillbox or an alarm to help you take the right medicines at the right time. °Lifestyle ° °· Eat a balanced diet that includes fruits and vegetables. °· Get enough sleep. For most adults, this is 7-9 hours each night. °· Do not drink alcohol. °· Do not become isolated. Spend time with other people and make plans for your days. °· Do not drive until your health care provider says that it is safe to do so. °· Do not use any products that contain nicotine or tobacco, such as cigarettes and e-cigarettes. If you need help quitting, ask your health care provider. °· Stop other activities that may increase your chances of getting hurt. These may include some work duties, sports activities, swimming, or bike riding. Ask your health care provider what activities are safe for you. °What caregivers can do °· Find out if the person is confused. Ask the person to state his or her name, age, and the date. If the person is unsure or answers incorrectly, he or she may be confused. °· Always introduce yourself, no matter how well the person knows you. °· Remind the person of his or her location. Do this often. °· Place a calendar and clock near the person who is confused. °· Talk about current events and plans for the day. °· Keep the environment calm, quiet, and peaceful. °· Help the person do the things that he or she is unable to do. These include: °? Taking medicines. °? Keeping follow-up visits with his or her health care   provider. °? Helping with household duties, including meal preparation. °? Running errands. °· Get help if you need it. There are several support groups for caregivers. °· If the person you are helping needs more support, consider day care, extended care programs, or a skilled nursing facility. The person's health care provider may be able to help evaluate these options. °General  instructions °· Monitor yourself for any conditions you may have. These may include: °? Checking your blood glucose levels, if you have diabetes. °? Watching your weight, if you are overweight. °? Monitoring your blood pressure, if you have hypertension. °? Monitoring your body temperature, if you have a fever. °· Keep all follow-up visits as told by your health care provider. This is important. °Contact a health care provider if: °· Your symptoms get worse. °Get help right away if you: °· Feel that you are not able to care for yourself. °· Develop severe headaches, repeated vomiting, seizures, blackouts, or slurred speech. °· Have increasing confusion, weakness, numbness, restlessness, or personality changes. °· Develop a loss of balance, have marked dizziness, feel uncoordinated, or fall. °· Develop severe anxiety, or you have delusions or hallucinations. °These symptoms may represent a serious problem that is an emergency. Do not wait to see if the symptoms will go away. Get medical help right away. Call your local emergency services (911 in the [place] Do not drive yourself to the hospital. °Summary °· Confusion is the inability to think with the usual speed or clarity. People who are confused often describe their thinking as cloudy or unclear. °· Confusion can also include having difficulty remembering, paying attention, or making decisions. °· Confusion may come on quickly or develop slowly over time, depending on the cause. There are many different causes of confusion. °· Ask for help from family members or friends if you are unable to take care of yourself. °This information is not intended to replace advice given to you by your health care provider. Make sure you discuss any questions you have with your health care provider. °Document Revised: 07/07/2017 Document Reviewed: 07/07/2017 °Elsevier Patient Education © 2020 [employer] ° °

## 2019-10-25 NOTE — Assessment & Plan Note (Signed)
F/u neuro with family member

## 2019-10-25 NOTE — Assessment & Plan Note (Signed)
Refer to neuro psych  F/u neuro  Check MRI brain

## 2019-10-25 NOTE — Assessment & Plan Note (Signed)
Pt was unble to void in office  Will send home with cup  Pt needs to drink more water

## 2019-10-25 NOTE — Assessment & Plan Note (Signed)
Will start to wean off meds Dec to effexor 75 mg daily for at least a month

## 2019-10-26 DIAGNOSIS — R8290 Unspecified abnormal findings in urine: Secondary | ICD-10-CM | POA: Diagnosis not present

## 2019-10-26 DIAGNOSIS — M6281 Muscle weakness (generalized): Secondary | ICD-10-CM | POA: Diagnosis not present

## 2019-10-26 DIAGNOSIS — R2681 Unsteadiness on feet: Secondary | ICD-10-CM | POA: Diagnosis not present

## 2019-10-26 DIAGNOSIS — M25562 Pain in left knee: Secondary | ICD-10-CM | POA: Diagnosis not present

## 2019-10-26 DIAGNOSIS — M25561 Pain in right knee: Secondary | ICD-10-CM | POA: Diagnosis not present

## 2019-10-26 NOTE — Addendum Note (Signed)
Addended by: Kelle Darting A on: 10/26/2019 07:51 AM   Modules accepted: Orders

## 2019-10-29 DIAGNOSIS — M6281 Muscle weakness (generalized): Secondary | ICD-10-CM | POA: Diagnosis not present

## 2019-10-29 DIAGNOSIS — M25562 Pain in left knee: Secondary | ICD-10-CM | POA: Diagnosis not present

## 2019-10-29 DIAGNOSIS — R2681 Unsteadiness on feet: Secondary | ICD-10-CM | POA: Diagnosis not present

## 2019-10-29 DIAGNOSIS — M25561 Pain in right knee: Secondary | ICD-10-CM | POA: Diagnosis not present

## 2019-10-29 LAB — URINE CULTURE
MICRO NUMBER:: 10346242
SPECIMEN QUALITY:: ADEQUATE

## 2019-10-30 ENCOUNTER — Other Ambulatory Visit: Payer: Self-pay | Admitting: Cardiovascular Disease

## 2019-10-30 DIAGNOSIS — M25562 Pain in left knee: Secondary | ICD-10-CM | POA: Diagnosis not present

## 2019-10-30 DIAGNOSIS — R2681 Unsteadiness on feet: Secondary | ICD-10-CM | POA: Diagnosis not present

## 2019-10-30 DIAGNOSIS — M25561 Pain in right knee: Secondary | ICD-10-CM | POA: Diagnosis not present

## 2019-10-30 DIAGNOSIS — M6281 Muscle weakness (generalized): Secondary | ICD-10-CM | POA: Diagnosis not present

## 2019-10-31 ENCOUNTER — Encounter: Payer: Self-pay | Admitting: Psychology

## 2019-10-31 DIAGNOSIS — R2681 Unsteadiness on feet: Secondary | ICD-10-CM | POA: Diagnosis not present

## 2019-10-31 DIAGNOSIS — M25562 Pain in left knee: Secondary | ICD-10-CM | POA: Diagnosis not present

## 2019-10-31 DIAGNOSIS — M6281 Muscle weakness (generalized): Secondary | ICD-10-CM | POA: Diagnosis not present

## 2019-10-31 DIAGNOSIS — M25561 Pain in right knee: Secondary | ICD-10-CM | POA: Diagnosis not present

## 2019-11-01 DIAGNOSIS — M25562 Pain in left knee: Secondary | ICD-10-CM | POA: Diagnosis not present

## 2019-11-01 DIAGNOSIS — M25561 Pain in right knee: Secondary | ICD-10-CM | POA: Diagnosis not present

## 2019-11-01 DIAGNOSIS — R41841 Cognitive communication deficit: Secondary | ICD-10-CM | POA: Diagnosis not present

## 2019-11-01 DIAGNOSIS — M6281 Muscle weakness (generalized): Secondary | ICD-10-CM | POA: Diagnosis not present

## 2019-11-01 DIAGNOSIS — R498 Other voice and resonance disorders: Secondary | ICD-10-CM | POA: Diagnosis not present

## 2019-11-01 DIAGNOSIS — R2681 Unsteadiness on feet: Secondary | ICD-10-CM | POA: Diagnosis not present

## 2019-11-02 DIAGNOSIS — M6281 Muscle weakness (generalized): Secondary | ICD-10-CM | POA: Diagnosis not present

## 2019-11-02 DIAGNOSIS — M25561 Pain in right knee: Secondary | ICD-10-CM | POA: Diagnosis not present

## 2019-11-02 DIAGNOSIS — M25562 Pain in left knee: Secondary | ICD-10-CM | POA: Diagnosis not present

## 2019-11-02 DIAGNOSIS — R41841 Cognitive communication deficit: Secondary | ICD-10-CM | POA: Diagnosis not present

## 2019-11-02 DIAGNOSIS — R498 Other voice and resonance disorders: Secondary | ICD-10-CM | POA: Diagnosis not present

## 2019-11-02 DIAGNOSIS — R2681 Unsteadiness on feet: Secondary | ICD-10-CM | POA: Diagnosis not present

## 2019-11-05 ENCOUNTER — Ambulatory Visit (HOSPITAL_COMMUNITY)
Admission: RE | Admit: 2019-11-05 | Discharge: 2019-11-05 | Disposition: A | Discharge status: Home / Self Care | Payer: Medicare Other | Source: Ambulatory Visit | Attending: Family Medicine | Admitting: Family Medicine

## 2019-11-05 ENCOUNTER — Other Ambulatory Visit: Payer: Self-pay

## 2019-11-05 DIAGNOSIS — R2681 Unsteadiness on feet: Secondary | ICD-10-CM | POA: Diagnosis not present

## 2019-11-05 DIAGNOSIS — R29818 Other symptoms and signs involving the nervous system: Secondary | ICD-10-CM | POA: Insufficient documentation

## 2019-11-05 DIAGNOSIS — R519 Headache, unspecified: Secondary | ICD-10-CM | POA: Diagnosis not present

## 2019-11-05 DIAGNOSIS — R4182 Altered mental status, unspecified: Secondary | ICD-10-CM | POA: Diagnosis not present

## 2019-11-05 DIAGNOSIS — M6281 Muscle weakness (generalized): Secondary | ICD-10-CM | POA: Diagnosis not present

## 2019-11-06 DIAGNOSIS — R41841 Cognitive communication deficit: Secondary | ICD-10-CM | POA: Diagnosis not present

## 2019-11-06 DIAGNOSIS — R2681 Unsteadiness on feet: Secondary | ICD-10-CM | POA: Diagnosis not present

## 2019-11-06 DIAGNOSIS — M6281 Muscle weakness (generalized): Secondary | ICD-10-CM | POA: Diagnosis not present

## 2019-11-06 DIAGNOSIS — R498 Other voice and resonance disorders: Secondary | ICD-10-CM | POA: Diagnosis not present

## 2019-11-06 DIAGNOSIS — M25561 Pain in right knee: Secondary | ICD-10-CM | POA: Diagnosis not present

## 2019-11-06 DIAGNOSIS — M25562 Pain in left knee: Secondary | ICD-10-CM | POA: Diagnosis not present

## 2019-11-07 ENCOUNTER — Other Ambulatory Visit: Payer: Self-pay | Admitting: Family Medicine

## 2019-11-07 DIAGNOSIS — M25562 Pain in left knee: Secondary | ICD-10-CM | POA: Diagnosis not present

## 2019-11-07 DIAGNOSIS — R498 Other voice and resonance disorders: Secondary | ICD-10-CM | POA: Diagnosis not present

## 2019-11-07 DIAGNOSIS — E785 Hyperlipidemia, unspecified: Secondary | ICD-10-CM

## 2019-11-07 DIAGNOSIS — R41841 Cognitive communication deficit: Secondary | ICD-10-CM | POA: Diagnosis not present

## 2019-11-07 DIAGNOSIS — M6281 Muscle weakness (generalized): Secondary | ICD-10-CM | POA: Diagnosis not present

## 2019-11-07 DIAGNOSIS — R2681 Unsteadiness on feet: Secondary | ICD-10-CM | POA: Diagnosis not present

## 2019-11-07 DIAGNOSIS — M25561 Pain in right knee: Secondary | ICD-10-CM | POA: Diagnosis not present

## 2019-11-08 ENCOUNTER — Telehealth: Payer: Self-pay

## 2019-11-08 DIAGNOSIS — M25561 Pain in right knee: Secondary | ICD-10-CM | POA: Diagnosis not present

## 2019-11-08 DIAGNOSIS — M25562 Pain in left knee: Secondary | ICD-10-CM | POA: Diagnosis not present

## 2019-11-08 DIAGNOSIS — R2681 Unsteadiness on feet: Secondary | ICD-10-CM | POA: Diagnosis not present

## 2019-11-08 DIAGNOSIS — M6281 Muscle weakness (generalized): Secondary | ICD-10-CM | POA: Diagnosis not present

## 2019-11-08 NOTE — Telephone Encounter (Signed)
Patient called in to see if Dr. Etter Sjogren or the nurse could give her a call to discuss her test results. Please give the patient a call at [phone]

## 2019-11-09 DIAGNOSIS — R2681 Unsteadiness on feet: Secondary | ICD-10-CM | POA: Diagnosis not present

## 2019-11-09 DIAGNOSIS — M6281 Muscle weakness (generalized): Secondary | ICD-10-CM | POA: Diagnosis not present

## 2019-11-09 NOTE — Telephone Encounter (Signed)
Patient called. Spoke with daughter. Daughter on [place]. Results given. Verbalized understanding.

## 2019-11-12 DIAGNOSIS — M6281 Muscle weakness (generalized): Secondary | ICD-10-CM | POA: Diagnosis not present

## 2019-11-12 DIAGNOSIS — R2681 Unsteadiness on feet: Secondary | ICD-10-CM | POA: Diagnosis not present

## 2019-11-12 DIAGNOSIS — M25561 Pain in right knee: Secondary | ICD-10-CM | POA: Diagnosis not present

## 2019-11-12 DIAGNOSIS — R41841 Cognitive communication deficit: Secondary | ICD-10-CM | POA: Diagnosis not present

## 2019-11-12 DIAGNOSIS — R498 Other voice and resonance disorders: Secondary | ICD-10-CM | POA: Diagnosis not present

## 2019-11-12 DIAGNOSIS — M25562 Pain in left knee: Secondary | ICD-10-CM | POA: Diagnosis not present

## 2019-11-14 DIAGNOSIS — R41841 Cognitive communication deficit: Secondary | ICD-10-CM | POA: Diagnosis not present

## 2019-11-14 DIAGNOSIS — R2681 Unsteadiness on feet: Secondary | ICD-10-CM | POA: Diagnosis not present

## 2019-11-14 DIAGNOSIS — M25561 Pain in right knee: Secondary | ICD-10-CM | POA: Diagnosis not present

## 2019-11-14 DIAGNOSIS — M25562 Pain in left knee: Secondary | ICD-10-CM | POA: Diagnosis not present

## 2019-11-14 DIAGNOSIS — M6281 Muscle weakness (generalized): Secondary | ICD-10-CM | POA: Diagnosis not present

## 2019-11-14 DIAGNOSIS — R498 Other voice and resonance disorders: Secondary | ICD-10-CM | POA: Diagnosis not present

## 2019-11-15 DIAGNOSIS — R2681 Unsteadiness on feet: Secondary | ICD-10-CM | POA: Diagnosis not present

## 2019-11-15 DIAGNOSIS — M6281 Muscle weakness (generalized): Secondary | ICD-10-CM | POA: Diagnosis not present

## 2019-11-15 DIAGNOSIS — M25562 Pain in left knee: Secondary | ICD-10-CM | POA: Diagnosis not present

## 2019-11-15 DIAGNOSIS — M25561 Pain in right knee: Secondary | ICD-10-CM | POA: Diagnosis not present

## 2019-11-16 ENCOUNTER — Other Ambulatory Visit: Payer: Self-pay

## 2019-11-16 DIAGNOSIS — M25562 Pain in left knee: Secondary | ICD-10-CM | POA: Diagnosis not present

## 2019-11-16 DIAGNOSIS — M25561 Pain in right knee: Secondary | ICD-10-CM | POA: Diagnosis not present

## 2019-11-16 DIAGNOSIS — R2681 Unsteadiness on feet: Secondary | ICD-10-CM | POA: Diagnosis not present

## 2019-11-16 DIAGNOSIS — E785 Hyperlipidemia, unspecified: Secondary | ICD-10-CM

## 2019-11-16 DIAGNOSIS — M6281 Muscle weakness (generalized): Secondary | ICD-10-CM | POA: Diagnosis not present

## 2019-11-16 MED ORDER — LOVASTATIN 20 MG PO TABS: 20.0000 mg | ORAL_TABLET | Freq: Every day | ORAL | 0 refills | Status: DC

## 2019-11-19 DIAGNOSIS — M25562 Pain in left knee: Secondary | ICD-10-CM | POA: Diagnosis not present

## 2019-11-19 DIAGNOSIS — R41841 Cognitive communication deficit: Secondary | ICD-10-CM | POA: Diagnosis not present

## 2019-11-19 DIAGNOSIS — R498 Other voice and resonance disorders: Secondary | ICD-10-CM | POA: Diagnosis not present

## 2019-11-19 DIAGNOSIS — M25561 Pain in right knee: Secondary | ICD-10-CM | POA: Diagnosis not present

## 2019-11-19 DIAGNOSIS — M6281 Muscle weakness (generalized): Secondary | ICD-10-CM | POA: Diagnosis not present

## 2019-11-19 DIAGNOSIS — R2681 Unsteadiness on feet: Secondary | ICD-10-CM | POA: Diagnosis not present

## 2019-11-20 ENCOUNTER — Ambulatory Visit: Payer: Medicare Other | Admitting: Cardiovascular Disease

## 2019-11-20 ENCOUNTER — Ambulatory Visit: Payer: Medicare Other | Admitting: Physician Assistant

## 2019-11-21 DIAGNOSIS — M25561 Pain in right knee: Secondary | ICD-10-CM | POA: Diagnosis not present

## 2019-11-21 DIAGNOSIS — M25562 Pain in left knee: Secondary | ICD-10-CM | POA: Diagnosis not present

## 2019-11-21 DIAGNOSIS — R2681 Unsteadiness on feet: Secondary | ICD-10-CM | POA: Diagnosis not present

## 2019-11-21 DIAGNOSIS — M6281 Muscle weakness (generalized): Secondary | ICD-10-CM | POA: Diagnosis not present

## 2019-11-22 DIAGNOSIS — M25561 Pain in right knee: Secondary | ICD-10-CM | POA: Diagnosis not present

## 2019-11-22 DIAGNOSIS — R2681 Unsteadiness on feet: Secondary | ICD-10-CM | POA: Diagnosis not present

## 2019-11-22 DIAGNOSIS — M6281 Muscle weakness (generalized): Secondary | ICD-10-CM | POA: Diagnosis not present

## 2019-11-22 DIAGNOSIS — R41841 Cognitive communication deficit: Secondary | ICD-10-CM | POA: Diagnosis not present

## 2019-11-22 DIAGNOSIS — M25562 Pain in left knee: Secondary | ICD-10-CM | POA: Diagnosis not present

## 2019-11-22 DIAGNOSIS — R498 Other voice and resonance disorders: Secondary | ICD-10-CM | POA: Diagnosis not present

## 2019-11-23 DIAGNOSIS — R2681 Unsteadiness on feet: Secondary | ICD-10-CM | POA: Diagnosis not present

## 2019-11-23 DIAGNOSIS — M6281 Muscle weakness (generalized): Secondary | ICD-10-CM | POA: Diagnosis not present

## 2019-11-23 DIAGNOSIS — M25561 Pain in right knee: Secondary | ICD-10-CM | POA: Diagnosis not present

## 2019-11-23 DIAGNOSIS — M25562 Pain in left knee: Secondary | ICD-10-CM | POA: Diagnosis not present

## 2019-11-25 ENCOUNTER — Emergency Department (HOSPITAL_COMMUNITY): Payer: Medicare Other

## 2019-11-25 ENCOUNTER — Encounter (HOSPITAL_COMMUNITY): Payer: Self-pay

## 2019-11-25 ENCOUNTER — Other Ambulatory Visit: Payer: Self-pay

## 2019-11-25 ENCOUNTER — Inpatient Hospital Stay (HOSPITAL_COMMUNITY)
Admission: EM | Admit: 2019-11-25 | Discharge: 2019-11-28 | DRG: 871 | Disposition: A | Discharge status: Home Health | Payer: Medicare Other | Source: Skilled Nursing Facility | Attending: Family Medicine | Admitting: Family Medicine

## 2019-11-25 ENCOUNTER — Inpatient Hospital Stay (HOSPITAL_COMMUNITY): Payer: Medicare Other

## 2019-11-25 DIAGNOSIS — N3090 Cystitis, unspecified without hematuria: Secondary | ICD-10-CM

## 2019-11-25 DIAGNOSIS — W19XXXA Unspecified fall, initial encounter: Secondary | ICD-10-CM | POA: Diagnosis not present

## 2019-11-25 DIAGNOSIS — E785 Hyperlipidemia, unspecified: Secondary | ICD-10-CM | POA: Diagnosis not present

## 2019-11-25 DIAGNOSIS — F419 Anxiety disorder, unspecified: Secondary | ICD-10-CM | POA: Diagnosis present

## 2019-11-25 DIAGNOSIS — S0990XA Unspecified injury of head, initial encounter: Secondary | ICD-10-CM

## 2019-11-25 DIAGNOSIS — G9340 Encephalopathy, unspecified: Secondary | ICD-10-CM | POA: Diagnosis not present

## 2019-11-25 DIAGNOSIS — Z7982 Long term (current) use of aspirin: Secondary | ICD-10-CM | POA: Diagnosis not present

## 2019-11-25 DIAGNOSIS — B9620 Unspecified Escherichia coli [E. coli] as the cause of diseases classified elsewhere: Secondary | ICD-10-CM | POA: Diagnosis not present

## 2019-11-25 DIAGNOSIS — E860 Dehydration: Secondary | ICD-10-CM | POA: Diagnosis not present

## 2019-11-25 DIAGNOSIS — Z20822 Contact with and (suspected) exposure to covid-19: Secondary | ICD-10-CM | POA: Diagnosis present

## 2019-11-25 DIAGNOSIS — Y92009 Unspecified place in unspecified non-institutional (private) residence as the place of occurrence of the external cause: Secondary | ICD-10-CM | POA: Diagnosis not present

## 2019-11-25 DIAGNOSIS — A419 Sepsis, unspecified organism: Secondary | ICD-10-CM | POA: Diagnosis not present

## 2019-11-25 DIAGNOSIS — N3000 Acute cystitis without hematuria: Secondary | ICD-10-CM | POA: Diagnosis present

## 2019-11-25 DIAGNOSIS — I110 Hypertensive heart disease with heart failure: Secondary | ICD-10-CM | POA: Diagnosis present

## 2019-11-25 DIAGNOSIS — F329 Major depressive disorder, single episode, unspecified: Secondary | ICD-10-CM | POA: Diagnosis present

## 2019-11-25 DIAGNOSIS — S79912A Unspecified injury of left hip, initial encounter: Secondary | ICD-10-CM | POA: Diagnosis not present

## 2019-11-25 DIAGNOSIS — F32A Depression, unspecified: Secondary | ICD-10-CM | POA: Diagnosis present

## 2019-11-25 DIAGNOSIS — R6520 Severe sepsis without septic shock: Secondary | ICD-10-CM | POA: Diagnosis not present

## 2019-11-25 DIAGNOSIS — G9341 Metabolic encephalopathy: Secondary | ICD-10-CM | POA: Diagnosis not present

## 2019-11-25 DIAGNOSIS — I5042 Chronic combined systolic (congestive) and diastolic (congestive) heart failure: Secondary | ICD-10-CM | POA: Diagnosis present

## 2019-11-25 DIAGNOSIS — N390 Urinary tract infection, site not specified: Secondary | ICD-10-CM | POA: Diagnosis not present

## 2019-11-25 DIAGNOSIS — I712 Thoracic aortic aneurysm, without rupture: Secondary | ICD-10-CM | POA: Diagnosis present

## 2019-11-25 DIAGNOSIS — G20 Parkinson's disease: Secondary | ICD-10-CM | POA: Diagnosis present

## 2019-11-25 DIAGNOSIS — R0602 Shortness of breath: Secondary | ICD-10-CM

## 2019-11-25 DIAGNOSIS — G4489 Other headache syndrome: Secondary | ICD-10-CM | POA: Diagnosis not present

## 2019-11-25 DIAGNOSIS — G20A1 Parkinson's disease without dyskinesia, without mention of fluctuations: Secondary | ICD-10-CM | POA: Diagnosis present

## 2019-11-25 DIAGNOSIS — W1830XA Fall on same level, unspecified, initial encounter: Secondary | ICD-10-CM | POA: Diagnosis present

## 2019-11-25 DIAGNOSIS — E876 Hypokalemia: Secondary | ICD-10-CM | POA: Diagnosis present

## 2019-11-25 DIAGNOSIS — D539 Nutritional anemia, unspecified: Secondary | ICD-10-CM | POA: Diagnosis present

## 2019-11-25 DIAGNOSIS — R531 Weakness: Secondary | ICD-10-CM

## 2019-11-25 DIAGNOSIS — R296 Repeated falls: Secondary | ICD-10-CM | POA: Diagnosis not present

## 2019-11-25 DIAGNOSIS — K219 Gastro-esophageal reflux disease without esophagitis: Secondary | ICD-10-CM | POA: Diagnosis present

## 2019-11-25 DIAGNOSIS — Z79899 Other long term (current) drug therapy: Secondary | ICD-10-CM

## 2019-11-25 LAB — BASIC METABOLIC PANEL
Anion gap: 10 (ref 5–15)
BUN: 24 mg/dL — ABNORMAL HIGH (ref 8–23)
CO2: 27 mmol/L (ref 22–32)
Calcium: 8.9 mg/dL (ref 8.9–10.3)
Chloride: 101 mmol/L (ref 98–111)
Creatinine, Ser: 0.91 mg/dL (ref 0.44–1.00)
GFR calc Af Amer: >60 mL/min (ref >60)
GFR calc non Af Amer: 59 mL/min — ABNORMAL LOW (ref >60)
Glucose, Bld: 116 mg/dL — ABNORMAL HIGH (ref 70–99)
Potassium: 3.5 mmol/L (ref 3.5–5.1)
Sodium: 138 mmol/L (ref 135–145)

## 2019-11-25 LAB — CBC WITH DIFFERENTIAL/PLATELET
Abs Immature Granulocytes: 0.1 10*3/uL — ABNORMAL HIGH (ref 0.00–0.07)
Basophils Absolute: 0 10*3/uL (ref 0.0–0.1)
Basophils Relative: 0 %
Eosinophils Absolute: 0 10*3/uL (ref 0.0–0.5)
Eosinophils Relative: 0 %
HCT: 36.8 % (ref 36.0–46.0)
Hemoglobin: 11.9 g/dL — ABNORMAL LOW (ref 12.0–15.0)
Immature Granulocytes: 1 %
Lymphocytes Relative: 8 %
Lymphs Abs: 1.1 10*3/uL (ref 0.7–4.0)
MCH: 32.2 pg (ref 26.0–34.0)
MCHC: 32.3 g/dL (ref 30.0–36.0)
MCV: 99.5 fL (ref 80.0–100.0)
Monocytes Absolute: 1.1 10*3/uL — ABNORMAL HIGH (ref 0.1–1.0)
Monocytes Relative: 8 %
Neutro Abs: 11.9 10*3/uL — ABNORMAL HIGH (ref 1.7–7.7)
Neutrophils Relative %: 83 %
Platelets: 262 10*3/uL (ref 150–400)
RBC: 3.7 MIL/uL — ABNORMAL LOW (ref 3.87–5.11)
RDW: 13 % (ref 11.5–15.5)
WBC: 14.3 10*3/uL — ABNORMAL HIGH (ref 4.0–10.5)
nRBC: 0 % (ref 0.0–0.2)

## 2019-11-25 LAB — URINALYSIS, ROUTINE W REFLEX MICROSCOPIC
Bilirubin Urine: NEGATIVE
Glucose, UA: NEGATIVE mg/dL
Ketones, ur: 5 mg/dL — AB
Nitrite: NEGATIVE
Protein, ur: 100 mg/dL — AB
Specific Gravity, Urine: 1.013 (ref 1.005–1.030)
WBC, UA: >50 WBC/hpf — ABNORMAL HIGH (ref 0–5)
pH: 7 (ref 5.0–8.0)

## 2019-11-25 LAB — LACTIC ACID, PLASMA
Lactic Acid, Venous: 1.2 mmol/L (ref 0.5–1.9)
Lactic Acid, Venous: 1.2 mmol/L (ref 0.5–1.9)

## 2019-11-25 LAB — HEPATIC FUNCTION PANEL
ALT: 14 U/L (ref 0–44)
AST: 21 U/L (ref 15–41)
Albumin: 4.3 g/dL (ref 3.5–5.0)
Alkaline Phosphatase: 67 U/L (ref 38–126)
Bilirubin, Direct: 0.3 mg/dL — ABNORMAL HIGH (ref 0.0–0.2)
Indirect Bilirubin: 1.1 mg/dL — ABNORMAL HIGH (ref 0.3–0.9)
Total Bilirubin: 1.4 mg/dL — ABNORMAL HIGH (ref 0.3–1.2)
Total Protein: 7.3 g/dL (ref 6.5–8.1)

## 2019-11-25 LAB — RESPIRATORY PANEL BY RT PCR (FLU A&B, COVID)
Influenza A by PCR: NEGATIVE
Influenza B by PCR: NEGATIVE
SARS Coronavirus 2 by RT PCR: NEGATIVE

## 2019-11-25 LAB — PROTIME-INR
INR: 1.1 (ref 0.8–1.2)
Prothrombin Time: 14.2 seconds (ref 11.4–15.2)

## 2019-11-25 LAB — MRSA PCR SCREENING: MRSA by PCR: NEGATIVE

## 2019-11-25 LAB — APTT: aPTT: 26 seconds (ref 24–36)

## 2019-11-25 MED ORDER — BISOPROLOL FUMARATE 5 MG PO TABS
2.5000 mg | ORAL_TABLET | Freq: Every day | ORAL | Status: DC
  Administered 2019-11-26 – 2019-11-28 (×3): 2.5 mg via ORAL
  Filled 2019-11-25 (×3): qty 1

## 2019-11-25 MED ORDER — ASPIRIN EC 81 MG PO TBEC
81.0000 mg | DELAYED_RELEASE_TABLET | Freq: Every day | ORAL | Status: DC
  Administered 2019-11-26 – 2019-11-28 (×3): 81 mg via ORAL
  Filled 2019-11-25 (×3): qty 1

## 2019-11-25 MED ORDER — ALBUTEROL SULFATE (2.5 MG/3ML) 0.083% IN NEBU: 2.5000 mg | INHALATION_SOLUTION | RESPIRATORY_TRACT | Status: DC | PRN

## 2019-11-25 MED ORDER — SODIUM CHLORIDE 0.9% FLUSH
3.0000 mL | Freq: Two times a day (BID) | INTRAVENOUS | Status: DC
  Administered 2019-11-25 – 2019-11-28 (×4): 3 mL via INTRAVENOUS

## 2019-11-25 MED ORDER — SODIUM CHLORIDE 0.9 % IV SOLN
1.0000 g | Freq: Once | INTRAVENOUS | Status: AC
  Administered 2019-11-25: 1 g via INTRAVENOUS
  Filled 2019-11-25: qty 10

## 2019-11-25 MED ORDER — LACTATED RINGERS IV BOLUS (SEPSIS)
1000.0000 mL | Freq: Once | INTRAVENOUS | Status: AC
  Administered 2019-11-25: 1000 mL via INTRAVENOUS

## 2019-11-25 MED ORDER — ACETAMINOPHEN 325 MG PO TABS
650.0000 mg | ORAL_TABLET | Freq: Once | ORAL | Status: AC
  Administered 2019-11-25: 650 mg via ORAL
  Filled 2019-11-25: qty 2

## 2019-11-25 MED ORDER — ACETAMINOPHEN 650 MG RE SUPP: 650.0000 mg | Freq: Four times a day (QID) | RECTAL | Status: DC | PRN

## 2019-11-25 MED ORDER — CHLORHEXIDINE GLUCONATE CLOTH 2 % EX PADS
6.0000 | MEDICATED_PAD | Freq: Every day | CUTANEOUS | Status: DC
Start: 2019-11-25 — End: 2019-11-28
  Administered 2019-11-25 – 2019-11-28 (×4): 6 via TOPICAL

## 2019-11-25 MED ORDER — LACTATED RINGERS IV BOLUS (SEPSIS)
500.0000 mL | Freq: Once | INTRAVENOUS | Status: AC
  Administered 2019-11-25: 500 mL via INTRAVENOUS

## 2019-11-25 MED ORDER — POLYETHYLENE GLYCOL 3350 17 G PO PACK
17.0000 g | PACK | Freq: Every day | ORAL | Status: DC | PRN
  Administered 2019-11-28: 17 g via ORAL
  Filled 2019-11-25: qty 1

## 2019-11-25 MED ORDER — LOSARTAN POTASSIUM 50 MG PO TABS
25.0000 mg | ORAL_TABLET | Freq: Every day | ORAL | Status: DC
  Administered 2019-11-25 – 2019-11-28 (×4): 25 mg via ORAL
  Filled 2019-11-25 (×4): qty 1

## 2019-11-25 MED ORDER — SODIUM CHLORIDE 0.9 % IV SOLN
1.0000 g | INTRAVENOUS | Status: DC
  Administered 2019-11-26 – 2019-11-27 (×2): 1 g via INTRAVENOUS
  Filled 2019-11-25 (×2): qty 10

## 2019-11-25 MED ORDER — ACETAMINOPHEN 325 MG PO TABS
650.0000 mg | ORAL_TABLET | Freq: Four times a day (QID) | ORAL | Status: DC | PRN
  Administered 2019-11-25 – 2019-11-28 (×6): 650 mg via ORAL
  Filled 2019-11-25 (×6): qty 2

## 2019-11-25 MED ORDER — SODIUM CHLORIDE 0.9 % IV BOLUS
500.0000 mL | Freq: Once | INTRAVENOUS | Status: AC
  Administered 2019-11-25: 500 mL via INTRAVENOUS

## 2019-11-25 MED ORDER — VANCOMYCIN HCL 750 MG/150ML IV SOLN
750.0000 mg | INTRAVENOUS | Status: DC
  Administered 2019-11-26: 750 mg via INTRAVENOUS
  Filled 2019-11-25 (×2): qty 150

## 2019-11-25 MED ORDER — ENOXAPARIN SODIUM 40 MG/0.4ML ~~LOC~~ SOLN
40.0000 mg | SUBCUTANEOUS | Status: DC
  Administered 2019-11-25 – 2019-11-27 (×3): 40 mg via SUBCUTANEOUS
  Filled 2019-11-25 (×3): qty 0.4

## 2019-11-25 MED ORDER — VENLAFAXINE HCL ER 150 MG PO CP24
150.0000 mg | ORAL_CAPSULE | Freq: Every morning | ORAL | Status: DC
  Administered 2019-11-26 – 2019-11-28 (×3): 150 mg via ORAL
  Filled 2019-11-25 (×3): qty 1

## 2019-11-25 MED ORDER — CARBIDOPA-LEVODOPA ER 25-100 MG PO TBCR
1.0000 | EXTENDED_RELEASE_TABLET | Freq: Three times a day (TID) | ORAL | Status: DC
  Administered 2019-11-25 – 2019-11-28 (×9): 1 via ORAL
  Filled 2019-11-25 (×11): qty 1

## 2019-11-25 MED ORDER — LACTATED RINGERS IV BOLUS (SEPSIS)
250.0000 mL | Freq: Once | INTRAVENOUS | Status: AC
  Administered 2019-11-25: 250 mL via INTRAVENOUS

## 2019-11-25 MED ORDER — PRAVASTATIN SODIUM 20 MG PO TABS
20.0000 mg | ORAL_TABLET | Freq: Every day | ORAL | Status: DC
  Administered 2019-11-25 – 2019-11-27 (×3): 20 mg via ORAL
  Filled 2019-11-25 (×3): qty 1

## 2019-11-25 MED ORDER — VANCOMYCIN HCL 1250 MG/250ML IV SOLN
1250.0000 mg | INTRAVENOUS | Status: AC
  Administered 2019-11-25: 1250 mg via INTRAVENOUS
  Filled 2019-11-25: qty 250

## 2019-11-25 MED ORDER — SODIUM CHLORIDE 0.9 % IV SOLN: INTRAVENOUS | Status: AC

## 2019-11-25 NOTE — ED Notes (Signed)
ED TO INPATIENT HANDOFF REPORT  ED Nurse Name and Phone #: Estill Bamberg, RN  S Name/Age/Gender Elizabeth Mccullough 79 y.o. female Room/Bed: WA10/WA10  Code Status   Code Status: Prior  Home/SNF/Other Independent Living Patient oriented to: self Is this baseline? No   Triage Complete: Triage complete  Chief Complaint Acute cystitis [N30.00] Sepsis ([place]) [A41.9]  Triage Note EMS reports from [facility], Walking this morning without walker, fall from standing, struck head on front right side on Tray table. No LOC, no obvious injury. EMS notes urine dark and odorous. Pt A&O x 4 on arrival.  BP 150/82 HR 90 RR 16 Sp02 98 RA CBG 137    Allergies Allergies  Allergen Reactions  . Iohexol Rash     Code: RASH, Desc: PATIENT STATES SHE IS ALLERGIC TO IV DYE. 20 YRS AGO SHE HAD A REACTION AT [facility], PT WAS GIVEN BENADRYL. 07/13/06/RM, Onset Date: [id number]   . Penicillins Hives    Level of Care/Admitting Diagnosis ED Disposition    ED Disposition Condition Comment   Admit  Hospital Area: [facility] [facility] [[id number]]  Level of Care: Stepdown [14]  Admit to SDU based on following criteria: Severe physiological/psychological symptoms:  Any diagnosis requiring assessment & intervention at least every 4 hours on an ongoing basis to obtain desired patient outcomes including stability and rehabilitation  Covid Evaluation: Asymptomatic Screening Protocol (No Symptoms)  [facility]: [id number]  Admitting Physician: Modena Jansky [[id number]]  Attending Physician: Vernell Leep D [[id number]]  Estimated length of stay: past midnight tomorrow  Certification:: I certify this patient will need inpatient services for at least 2 midnights       B Medical/Surgery History Past Medical History:  Diagnosis Date  . AAA (abdominal aortic aneurysm) ([place])   . Carotid artery disease ([place]) 01/12/2017   Carotid US 6/18: Bilat < 50%; repeat in 12/2000   . Chronic combined systolic and diastolic CHF (congestive heart failure) ([place]) 03/01/2017   NICM // [place] 8/18: normal coronary arteries /  Echo 8/18: EF 20-25, mod AI, mod MR, PASP 32 // Echo 1/19: EF 55-60, no RWMA, Gr 1 DD, mod AI, mild MR, PASP 32  . Hyperlipidemia   . Parkinson's disease [facility])    Past Surgical History:  Procedure Laterality Date  . BREAST CYST ASPIRATION  1965   Right Breast  . HAMMER TOE SURGERY  04/10/02   Left Toe  . KNEE SURGERY Right 10/17/15   meniscus repair  . NASAL SEPTUM SURGERY  1980  . RIGHT/LEFT HEART CATH AND CORONARY ANGIOGRAPHY N/A 03/03/2017   Procedure: RIGHT/LEFT HEART CATH AND CORONARY ANGIOGRAPHY;  Surgeon: Nelva Bush, MD;  Location: [facility];  Service: Cardiovascular;  Laterality: N/A;  . THORACIC AORTOGRAM N/A 03/03/2017   Procedure: Thoracic Aortogram;  Surgeon: Nelva Bush, MD;  Location: [place] [facility];  Service: Cardiovascular;  Laterality: N/A;     A IV Location/Drains/Wounds Patient Lines/Drains/Airways Status   Active Line/Drains/Airways    Name:   Placement date:   Placement time:   Site:   Days:   Peripheral IV 11/25/19 Left Antecubital   11/25/19    1104    Antecubital   less than 1   Wound 04/10/13 Laceration Finger (Comment which one) Right   04/10/13    1230    Finger (Comment which one)   2420          Intake/Output Last 24 hours  Intake/Output Summary (Last 24 hours)  at 11/25/2019 1546 Last data filed at 11/25/2019 1403 Gross per 24 hour  Intake 600 ml  Output --  Net 600 ml    Labs/Imaging Results for orders placed or performed during the hospital encounter of 11/25/19 (from the past 48 hour(s))  Urinalysis, Routine w reflex microscopic     Status: Abnormal   Collection Time: 11/25/19 10:51 AM  Result Value Ref Range   Color, Urine YELLOW YELLOW   APPearance HAZY (A) CLEAR   Specific Gravity, Urine 1.013 1.005 - 1.030   pH 7.0 5.0 - 8.0   Glucose, UA NEGATIVE NEGATIVE mg/dL   Hgb urine  dipstick MODERATE (A) NEGATIVE   Bilirubin Urine NEGATIVE NEGATIVE   Ketones, ur 5 (A) NEGATIVE mg/dL   Protein, ur 100 (A) NEGATIVE mg/dL   Nitrite NEGATIVE NEGATIVE   Leukocytes,Ua MODERATE (A) NEGATIVE   RBC / HPF 11-20 0 - 5 RBC/hpf   WBC, UA >50 (H) 0 - 5 WBC/hpf   Bacteria, UA RARE (A) NONE SEEN   Squamous Epithelial / LPF 0-5 0 - 5    Comment: Performed at [facility], [place] [address] [place], [place] [id number]  Basic metabolic panel     Status: Abnormal   Collection Time: 11/25/19 10:51 AM  Result Value Ref Range   Sodium 138 135 - 145 mmol/L   Potassium 3.5 3.5 - 5.1 mmol/L   Chloride 101 98 - 111 mmol/L   CO2 27 22 - 32 mmol/L   Glucose, Bld 116 (H) 70 - 99 mg/dL    Comment: Glucose reference range applies only to samples taken after fasting for at least 8 hours.   BUN 24 (H) 8 - 23 mg/dL   Creatinine, Ser 0.91 0.44 - 1.00 mg/dL   Calcium 8.9 8.9 - 10.3 mg/dL   GFR calc non Af Amer 59 (L) >60 mL/min   GFR calc Af Amer >60 >60 mL/min   Anion gap 10 5 - 15    Comment: Performed at [facility], [place] [address] [place], [place] [address]  CBC with Differential     Status: Abnormal   Collection Time: 11/25/19 10:51 AM  Result Value Ref Range   WBC 14.3 (H) 4.0 - 10.5 K/uL   RBC 3.70 (L) 3.87 - 5.11 MIL/uL   Hemoglobin 11.9 (L) 12.0 - 15.0 g/dL   HCT 36.8 36.0 - 46.0 %   MCV 99.5 80.0 - 100.0 fL   MCH 32.2 26.0 - 34.0 pg   MCHC 32.3 30.0 - 36.0 g/dL   RDW 13.0 11.5 - 15.5 %   Platelets 262 150 - 400 K/uL   nRBC 0.0 0.0 - 0.2 %   Neutrophils Relative % 83 %   Neutro Abs 11.9 (H) 1.7 - 7.7 K/uL   Lymphocytes Relative 8 %   Lymphs Abs 1.1 0.7 - 4.0 K/uL   Monocytes Relative 8 %   Monocytes Absolute 1.1 (H) 0.1 - 1.0 K/uL   Eosinophils Relative 0 %   Eosinophils Absolute 0.0 0.0 - 0.5 K/uL   Basophils Relative 0 %   Basophils Absolute 0.0 0.0 - 0.1 K/uL   Immature Granulocytes 1 %   Abs Immature Granulocytes 0.10 (H) 0.00 -  0.07 K/uL    Comment: Performed at [facility], [place] [address], [place], [place] [address]  APTT     Status: None   Collection Time: 11/25/19  3:16 PM  Result Value Ref Range   aPTT 26 24 -  36 seconds    Comment: Performed at [facility], [place] [address], [place], [place] [address]  Protime-INR     Status: None   Collection Time: 11/25/19  3:16 PM  Result Value Ref Range   Prothrombin Time 14.2 11.4 - 15.2 seconds   INR 1.1 0.8 - 1.2    Comment: (NOTE) INR goal varies based on device and disease states. Performed at [facility], [place] [address], [place],  [address]    CT Head Wo Contrast  Result Date: 11/25/2019 CLINICAL DATA:  Head trauma, minor. Additional provided: Fall from standing position, hit right-side of head on tray table. EXAM: CT HEAD WITHOUT CONTRAST TECHNIQUE: Contiguous axial images were obtained from the base of the skull through the vertex without intravenous contrast. COMPARISON:  Brain MRI 11/05/2019, head CT 08/24/2019. FINDINGS: Brain: Ill-defined hypoattenuation within the cerebral white matter is nonspecific, but consistent with chronic small vessel ischemic disease. Stable, moderate generalized parenchymal atrophy. There is no acute intracranial hemorrhage. No demarcated cortical infarct. No extra-axial fluid collection. No evidence of intracranial mass. No midline shift. Vascular: No hyperdense vessel.  Atherosclerotic calcifications. Skull: Normal. Negative for fracture or focal lesion. Sinuses/Orbits: Visualized orbits show no acute finding. Mild ethmoid and maxillary sinus mucosal thickening. No significant mastoid effusion. IMPRESSION: 1. No evidence of acute intracranial abnormality. 2. Stable generalized parenchymal atrophy and chronic small vessel ischemic disease. 3. Mild ethmoid and maxillary sinus mucosal thickening. Electronically Signed   By: Kellie Simmering DO   On: 11/25/2019 11:39   DG Hip  Unilat W or Wo Pelvis 2-3 Views Left  Result Date: 11/25/2019 CLINICAL DATA:  Pain following fall EXAM: DG HIP (WITH OR WITHOUT PELVIS) 2-3V LEFT COMPARISON:  February 27, 2012 FINDINGS: Frontal pelvis as well as frontal and lateral left hip images were obtained. No acute fracture or dislocation. There is moderately severe joint space narrowing on the right with milder narrowing on the left, unchanged. No erosive changes. Bony overgrowth is noted in the pubic symphysis region, stable. There is degenerative change in the lower lumbar spine, stable. 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 No fracture or dislocation. Electronically Signed   By: Lowella Grip III M.D.   On: 11/25/2019 11:45    Pending Labs Unresulted Labs (From admission, onward)    Start     Ordered   11/25/19 1458  Lactic acid, plasma  Now then every 2 hours,   STAT     11/25/19 1459   11/25/19 1458  Blood Culture (routine x 2)  BLOOD CULTURE X 2,   STAT     11/25/19 1459   11/25/19 1429  Respiratory Panel by RT PCR (Flu A&B, Covid) - Nasopharyngeal Swab  (Tier 2 Respiratory Panel by RT PCR (Flu A&B, Covid) (TAT 2 hrs))  ONCE - STAT,   STAT    Question Answer Comment  Is this test for diagnosis or screening Screening   Symptomatic for COVID-19 as defined by CDC No   Hospitalized for COVID-19 No   Admitted to ICU for COVID-19 No   Previously tested for COVID-19 Yes   Resident in a congregate (group) care setting Yes   Employed in healthcare setting No   Pregnant No   Has patient completed COVID vaccination(s) (2 doses of Pfizer/Moderna 1 dose of [employer]) No      11/25/19 1429   11/25/19 1051  Urine culture  ONCE - STAT,  STAT     11/25/19 1051          Vitals/Pain Today's Vitals   11/25/19 1300 11/25/19 1455 11/25/19 1500 11/25/19 1530  BP: (!) 189/69 (!) 158/108 (!) 165/93 (!) 176/92  Pulse: (!) 117 (!) 108 (!) 101 96  Resp: 16 (!) 24  (!) 27  Temp:  (!)  103 F (39.4 C)    TempSrc:      SpO2: 99% 95% 100% 99%  PainSc:        Isolation Precautions No active isolations  Medications Medications  lactated ringers bolus 1,000 mL (1,000 mLs Intravenous New Bag/Given 11/25/19 1509)    And  lactated ringers bolus 500 mL (has no administration in time range)    And  lactated ringers bolus 250 mL (has no administration in time range)  sodium chloride 0.9 % bolus 500 mL (0 mLs Intravenous Stopped 11/25/19 1230)  cefTRIAXone (ROCEPHIN) 1 g in sodium chloride 0.9 % 100 mL IVPB (0 g Intravenous Stopped 11/25/19 1403)  acetaminophen (TYLENOL) tablet 650 mg (650 mg Oral Given 11/25/19 1527)    Mobility walks with device Low fall risk   Focused Assessments Cardiac Assessment Handoff:  Cardiac Rhythm: Sinus tachycardia Lab Results  Component Value Date   CKTOTAL 64 03/10/2012   CKMB 4.9 (H) 03/01/2012   TROPONINI <0.30 03/01/2012   No results found for: DDIMER Does the Patient currently have chest pain? No     R Recommendations: See Admitting Provider Note  Report given to: [place] RN ICU  Additional Notes:

## 2019-11-25 NOTE — Progress Notes (Addendum)
Pharmacy Antibiotic Note  Elizabeth Mccullough is a 79 y.o. female admitted on 11/25/2019 with sepsis, possibly due to acute cystitis. Pharmacy has been consulted for Vancomycin dosing. Patient also placed on Ceftriaxone per MD.   Plan:  Vancomycin 1250mg  IV x 1, then 750mg  IV q24h  Vancomycin trough level at steady state, as indicated  Ceftriaxone 1g IV q24h per MD  Monitor renal function, cultures, clinical course  Height: 5\' 2"  (157.5 cm) Weight: 59.4 kg (130 lb 15.3 oz) IBW/kg (Calculated) : 50.1  Temp (24hrs), Avg:101.6 F (38.7 C), Min:99.2 F (37.3 C), Max:103 F (39.4 C)  Recent Labs  Lab 11/25/19 1051 11/25/19 1516  WBC 14.3*  --   CREATININE 0.91  --   LATICACIDVEN  --  1.2    Estimated Creatinine Clearance: 38.3 mL/min (by C-G formula based on SCr of 0.91 mg/dL).    Allergies  Allergen Reactions  . Iohexol Rash     Code: RASH, Desc: PATIENT STATES SHE IS ALLERGIC TO IV DYE. 20 YRS AGO SHE HAD A REACTION AT [facility], PT WAS GIVEN BENADRYL. 07/13/06/RM, Onset Date: [id number]   . Penicillins Hives    Antimicrobials this admission: 5/9 Ceftriaxone >> 5/9 Vancomycin >>  Dose adjustments this admission: --  Microbiology results: 5/9 BCx: sent 5/9 UCx: sent  5/9 COVID: negative 5/9 Influenza A/B: negative  5/9 MRSA PCR: sent   Thank you for allowing pharmacy to be a part of this patient's care.   Lindell Spar, PharmD, BCPS Clinical Pharmacist  11/25/2019 4:53 PM

## 2019-11-25 NOTE — ED Triage Notes (Signed)
EMS reports from [facility], Walking this morning without walker, fall from standing, struck head on front right side on Tray table. No LOC, no obvious injury. EMS notes urine dark and odorous. Pt A&O x 4 on arrival.  BP 150/82 HR 90 RR 16 Sp02 98 RA CBG 137
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
## 2019-11-25 NOTE — ED Provider Notes (Signed)
Medical screening examination/treatment/procedure(s) were conducted as a shared visit with non-physician practitioner(s) and myself.  I personally evaluated the patient during the encounter.    Patient presented to ED with weakness and fall.  Patient's ED work-up notable for urinary tract infection.  Initially she was afebrile and normotensive.  Plan was to admit to the hospital as the patient was weak and unstable.  Patient then developed a fever to 103.  She became more tachycardic.  Sepsis protocol initiated.  Patient has already been given Rocephin.  IV fluids have been ordered.  Lactic acid level pending.  Patient will be admitted to hospital for further treatment.   Dorie Rank, MD 11/25/19 1500

## 2019-11-25 NOTE — H&P (Addendum)
History and Physical    Elizabeth Mccullough [id number] DOB: 07/17/1941 DOA: 11/25/2019  PCP: Ann Held, DO   I have briefly reviewed patients previous medical reports in [place] [facility].  Patient coming from: [facility], independent living facility  Chief Complaint: Fall at home.  HPI: Elizabeth Mccullough is a 79 year old female, resident of an independent living facility, PMH of Parkinson's disease, suspected dementia, hyperlipidemia, hypertension, chronic combined systolic and diastolic CHF, AAA, TAA, GERD, anxiety and depression, presented to the [facility] ED on 11/25/2019 via EMS following a fall shortly prior to arrival.  Patient is confused and is unable to provide any history at this time.  No family at bedside.  History obtained from EDP discussion.  She reportedly was walking without her walker, lost her balance and fell forward.  On initial evaluation, complained of pain to the right side of her forehead.  She reported several days of dysuria.  She denied other history of recent illness.  She had denied fever, chills, abdominal pain, cough, chest pain, dyspnea, numbness, weakness, vision changes, neck/back pain, nausea, vomiting, diarrhea, dizziness, syncope or any other complaints.  EDP discussed with patient's daughter who indicated that the description provided of the patient's behavior at that time was typical for her, especially in times of stress.  She indicated that patient would have episodes of confusion and.'s of time where she just will not answer questions.  However she also noted that especially when the patient has a UTI, she will get much more confused.  She also stated that many times patient will create symptoms seemingly for the sake of attention.  Family have been suspicious of possible developing dementia over the last several years.  EDP attempted to ambulate the patient, she was quite unsteady on her feet, even using a walker and EDP and  nurse for support on either side.  She seemed overall quite weak.  She also seemed more confused than she was initially.  During my evaluation, patient however was distinctly worse.  She was much more confused, not oriented to person place or time.  Followed simple instructions.  Appeared slightly restless and fidgety, attempting to get out of bed.  Unable to provide any history.  ED Course: Initial temperature 99.2 which over a couple of hours at my evaluation increased to 103 F (by mouth), tachypneic in the mid 20s, tachycardic in the low 100s, blood pressures elevated in the 170s/80s-90s, not hypoxic.  Lab work significant for BUN 24, WBC 14.3, predominantly neutrophilic, hemoglobin [id number], INR 1.1, flu panel and COVID-19 RT-PCR negative.  Lactate 1.2.  Urine microscopy showed hazy appearance, 100 protein, rare bacteria, 11-20 red blood cell and >50 white blood cells.  Blood and urine cultures pending.  Since she had reported some left hip pain, x-ray of pelvis and hip done, stable areas of osteoarthritic change, most notably in the right hip joint.  No fracture or dislocation.  CT head: No evidence of acute intracranial abnormality.  Stable generalized parenchymal atrophy and chronic small vessel ischemic disease.  Mild ethmoid and maxillary sinus mucosal thickening.  Review of Systems:  All other systems reviewed and apart from HPI, are negative.  Past Medical History:  Diagnosis Date  . AAA (abdominal aortic aneurysm) ([place])   . Carotid artery disease ([place]) 01/12/2017   Carotid US 6/18: Bilat < 50%; repeat in 12/2000  . Chronic combined systolic and diastolic CHF (congestive heart failure) ([place]) 03/01/2017   NICM // [place] 8/18: normal  coronary arteries /  Echo 8/18: EF 20-25, mod AI, mod MR, PASP 32 // Echo 1/19: EF 55-60, no RWMA, Gr 1 DD, mod AI, mild MR, PASP 32  . Hyperlipidemia   . Parkinson's disease [facility])     Past Surgical History:  Procedure Laterality Date  . BREAST CYST ASPIRATION   1965   Right Breast  . HAMMER TOE SURGERY  04/10/02   Left Toe  . KNEE SURGERY Right 10/17/15   meniscus repair  . NASAL SEPTUM SURGERY  1980  . RIGHT/LEFT HEART CATH AND CORONARY ANGIOGRAPHY N/A 03/03/2017   Procedure: RIGHT/LEFT HEART CATH AND CORONARY ANGIOGRAPHY;  Surgeon: Nelva Bush, MD;  Location: [facility];  Service: Cardiovascular;  Laterality: N/A;  . THORACIC AORTOGRAM N/A 03/03/2017   Procedure: Thoracic Aortogram;  Surgeon: Nelva Bush, MD;  Location: [facility];  Service: Cardiovascular;  Laterality: N/A;    Social History  reports that she has never smoked. She has never used smokeless tobacco. She reports current alcohol use. She reports that she does not use drugs.  Allergies  Allergen Reactions  . Iohexol Rash     Code: RASH, Desc: PATIENT STATES SHE IS ALLERGIC TO IV DYE. 20 YRS AGO SHE HAD A REACTION AT [facility], PT WAS GIVEN BENADRYL. 07/13/06/RM, Onset Date: [id number]   . Penicillins Hives    Family History  Problem Relation Age of Onset  . Heart disease Mother   . Hyperlipidemia Sister   . Hyperlipidemia Brother   . Diabetes Sister   . Hyperlipidemia Sister   . Cancer Sister 21       colon  . Cancer Sister        breast  . Stroke Brother   . Hypertension Brother   . Breast cancer Other   . Colon cancer Other   . Prostate cancer Son   . Diabetes Daughter   . Stomach cancer Daughter   . Healthy Daughter      Prior to Admission medications   Medication Sig Start Date End Date Taking? Authorizing Provider  Acetaminophen 325 MG CAPS Take 1-2 capsules by mouth every 8 (eight) hours as needed (moderate to severe pain). 08/21/19  Yes Opalski, Neoma Laming, DO  aspirin EC 81 MG tablet Take 1 tablet (81 mg total) by mouth daily. 03/01/17  Yes Nahser, Wonda Cheng, MD  bisoprolol (ZEBETA) 5 MG tablet Take 0.5 tablets (2.5 mg total) by mouth daily. 01/23/19  Yes Nahser, Wonda Cheng, MD  Carbidopa-Levodopa ER (SINEMET CR) 25-100 MG tablet  controlled release 2 pills 3 times per day Patient taking differently: Take 1 tablet by mouth in the morning, at noon, and at bedtime.  08/06/19  Yes Star Age, MD  furosemide (LASIX) 20 MG tablet TAKE 1 TABLET BY MOUTH DAILY. Patient taking differently: Take 20 mg by mouth daily.  02/22/19  Yes Nahser, Wonda Cheng, MD  losartan (COZAAR) 25 MG tablet Take 1 tablet (25 mg total) by mouth daily. Please make yearly appt with Dr. Acie Fredrickson for July for future refills. 1st attempt 10/31/19  Yes Nahser, Wonda Cheng, MD  lovastatin (MEVACOR) 20 MG tablet Take 1 tablet (20 mg total) by mouth at bedtime. 11/16/19  Yes Roma Schanz R, DO  potassium chloride (KLOR-CON) 10 MEQ tablet TAKE 1 TABLET BY MOUTH DAILY. Patient taking differently: Take 10 mEq by mouth daily.  08/01/19  Yes Nahser, Wonda Cheng, MD  venlafaxine XR (EFFEXOR-XR) 150 MG 24 hr capsule Take 150 mg by mouth every  morning. 11/05/19  Yes [provider]    Physical Exam: Vitals:   11/25/19 1300 11/25/19 1455 11/25/19 1500 11/25/19 1530  BP: (!) 189/69 (!) 158/108 (!) 165/93 (!) 176/92  Pulse: (!) 117 (!) 108 (!) 101 96  Resp: 16 (!) 24  (!) 27  Temp:  (!) 103 F (39.4 C)    TempSrc:      SpO2: 99% 95% 100% 99%      Constitutional: Elderly female, small built, moderately nourished, ill looking, lying propped up in bed, appears slightly restless, fidgety, attempting to pull her pulse oximetry, remove sheets covering her and attempting to get out of bed. Eyes: PERTLA, lids and conjunctivae normal ENMT: Mucous membranes are dry, lips dry.  Not cooperating with examination of oral cavity. Neck: supple, no masses, no thyromegaly Respiratory: Clear to auscultation without wheezing, rhonchi or crackles.  Appears tachypneic but also currently febrile at 6 F which may be contributing. Cardiovascular: S1 & S2 heard, regular mild tachycardia. No JVD, murmurs, rubs or clicks. No pedal edema. Abdomen: Non distended. Non tender. Soft. No  organomegaly or masses appreciated. No clinical Ascites. Normal bowel sounds heard. Musculoskeletal: no clubbing / cyanosis. No joint deformity upper and lower extremities. Good ROM, no contractures. Normal muscle tone.  Appears to be moving all extremities without difficulty or asymmetry or pain Skin: no rashes, lesions, ulcers. No induration.  I did not notice any skin changes on the forehead noted by EDP. Neurologic: CN 2-12 grossly intact. Sensation intact, DTR normal. Strength 5/5 in all 4 limbs.  Psychiatric: Impaired judgment and insight. Alert but not oriented.  Follows some simple instructions.     Labs on Admission: I have personally reviewed following labs and imaging studies  CBC: Recent Labs  Lab 11/25/19 1051  WBC 14.3*  NEUTROABS 11.9*  HGB 11.9*  HCT 36.8  MCV 99.5  PLT 99991111    Basic Metabolic Panel: Recent Labs  Lab 11/25/19 1051  NA 138  K 3.5  CL 101  CO2 27  GLUCOSE 116*  BUN 24*  CREATININE 0.91  CALCIUM 8.9    Liver Function Tests: No results for input(s): AST, ALT, ALKPHOS, BILITOT, PROT, ALBUMIN in the last 168 hours.  Urine analysis:    Component Value Date/Time   COLORURINE YELLOW 11/25/2019 1051   APPEARANCEUR HAZY (A) 11/25/2019 1051   LABSPEC 1.013 11/25/2019 1051   PHURINE 7.0 11/25/2019 1051   GLUCOSEU NEGATIVE 11/25/2019 1051   HGBUR MODERATE (A) 11/25/2019 1051   HGBUR negative 12/05/2009 0000   BILIRUBINUR NEGATIVE 11/25/2019 1051   BILIRUBINUR POSITIVE 10/25/2019 1652   KETONESUR 5 (A) 11/25/2019 1051   PROTEINUR 100 (A) 11/25/2019 1051   UROBILINOGEN 0.2 10/25/2019 1652   UROBILINOGEN 0.2 02/27/2012 1857   NITRITE NEGATIVE 11/25/2019 1051   LEUKOCYTESUR MODERATE (A) 11/25/2019 1051     Radiological Exams on Admission: CT Head Wo Contrast  Result Date: 11/25/2019 CLINICAL DATA:  Head trauma, minor. Additional provided: Fall from standing position, hit right-side of head on tray table. EXAM: CT HEAD WITHOUT CONTRAST  TECHNIQUE: Contiguous axial images were obtained from the base of the skull through the vertex without intravenous contrast. COMPARISON:  Brain MRI 11/05/2019, head CT 08/24/2019. FINDINGS: Brain: Ill-defined hypoattenuation within the cerebral white matter is nonspecific, but consistent with chronic small vessel ischemic disease. Stable, moderate generalized parenchymal atrophy. There is no acute intracranial hemorrhage. No demarcated cortical infarct. No extra-axial fluid collection. No evidence of intracranial mass. No midline shift. Vascular: No hyperdense  vessel.  Atherosclerotic calcifications. Skull: Normal. Negative for fracture or focal lesion. Sinuses/Orbits: Visualized orbits show no acute finding. Mild ethmoid and maxillary sinus mucosal thickening. No significant mastoid effusion. IMPRESSION: 1. No evidence of acute intracranial abnormality. 2. Stable generalized parenchymal atrophy and chronic small vessel ischemic disease. 3. Mild ethmoid and maxillary sinus mucosal thickening. Electronically Signed   By: Kellie Simmering DO   On: 11/25/2019 11:39   DG Hip Unilat W or Wo Pelvis 2-3 Views Left  Result Date: 11/25/2019 CLINICAL DATA:  Pain following fall EXAM: DG HIP (WITH OR WITHOUT PELVIS) 2-3V LEFT COMPARISON:  February 27, 2012 FINDINGS: Frontal pelvis as well as frontal and lateral left hip images were obtained. No acute fracture or dislocation. There is moderately severe joint space narrowing on the right with milder narrowing on the left, unchanged. No erosive changes. Bony overgrowth is noted in the pubic symphysis region, stable. There is degenerative change in the lower lumbar spine, stable. 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 No fracture or dislocation. Electronically Signed   By: Lowella Grip III M.D.   On: 11/25/2019 11:45      Assessment/Plan Principal Problem:   Sepsis ([place]) Active Problems:   Hyperlipidemia   GERD   Falls  frequently   Parkinson disease ([place])   Chronic combined systolic and diastolic CHF (congestive heart failure) ([place])   Acute cystitis   Acute metabolic encephalopathy   Anxiety and depression   Dehydration     Sepsis, POA, possibly due to acute cystitis: Follow blood and urine culture results.  Urine culture in mid April 2021 had shown gram-positive cocci but there were no final results.  I discussed with infectious disease MD on call and agree with continuing ceftriaxone but added IV vancomycin to cover for Enterococcus plus/- MRSA.  Treated per sepsis protocol with IV fluids and antibiotics.  Once cultures are back, rapidly de-escalate appropriately.  Check chest x-ray to ensure no pneumonia.  Dehydration: Secondary to sepsis.  IV fluids.  Hold Lasix temporarily.  Acute metabolic encephalopathy: Secondary to sepsis complicating underlying Parkinson's and possible dementia.  Treat underlying cause.  Delirium precautions.  Avoid sedatives and opioids.   Mechanical fall: May be precipitated by UTI, sepsis complicating underlying Parkinson's disease with gait instability and dementia.  PT and OT evaluation.  Parkinson's disease: Continue Sinemet.  Chronic combined systolic and diastolic CHF: Clinically dehydrated.  Temporarily hold Lasix.  Continue prior home dose of beta-blockers.  Essential hypertension: Mildly uncontrolled.  Continue bisoprolol and ARB.  Hyperlipidemia: Continue statins.  Anxiety and depression: Continue Effexor.  Chronic headaches: As reported by patient's daughter.  Reportedly worked up outpatient and negative.  Unclear etiology.  May consider outpatient neurology consultation and follow-up.  Addendum:  LFTs unremarkable.  Chest x-ray reported as below:  1. Widening of the superior mediastinum on the right. Correlation with contrast-enhanced chest CT is recommended, as aneurysmal dilatation of the ascending thoracic aorta cannot be excluded. 2. No acute or  active cardiopulmonary disease.  I reviewed with reading radiologist, the widening mediastinum likely related to her known thoracic aortic aneurysm, no acute findings on this imaging, appears to follow outpatient with TCTS   DVT prophylaxis: Lovenox Code Status: Full, confirmed with patient's daughter/healthcare power of attorney. Family Communication: I discussed in detail with patient's daughter Elizabeth Mccullough) who worked for [employer].  I updated care and answered questions.  She will review with her family and patient's paperwork and update [place]  if change in CODE STATUS is warranted.  She indicates that patient was living with her but sustained "14 falls" and so was moved initially to an ALF and eventually to an independent living about 2 months ago.  She ambulates both with a cane and without a cane and reportedly is doing better at the ILF. Disposition Plan:   Patient is from:  [place], independent living facility.  Anticipated DC to:  To be determined pending clinical improvement and therapies evaluation.  Anticipated DC date:  To be determined  Anticipated DC barriers: Sepsis, acute metabolic encephalopathy   Consults called: None Admission status: Inpatient/stepdown  Severity of Illness: The appropriate patient status for this patient is INPATIENT. Inpatient status is judged to be reasonable and necessary in order to provide the required intensity of service to ensure the patient's safety. The patient's presenting symptoms, physical exam findings, and initial radiographic and laboratory data in the context of their chronic comorbidities is felt to place them at high risk for further clinical deterioration. Furthermore, it is not anticipated that the patient will be medically stable for discharge from the [facility] within 2 midnights of admission. The following factors support the patient status of inpatient.   " The patient's presenting symptoms include fall at home, dysuria and  increased confusion. " The worrisome physical exam findings include high fever, tachypnea, tachycardia, dry mucosa, disorientation. " The initial radiographic and laboratory data are worrisome because of leukocytosis, pyuria with some bacteria in the urine, CT head negative, x-ray pelvis negative for fractures. " The chronic co-morbidities include Parkinson's disease, suspected dementia, HTN and CHF.   * I certify that at the point of admission it is my clinical judgment that the patient will require inpatient [facility] care spanning beyond 2 midnights from the point of admission due to high intensity of service, high risk for further deterioration and high frequency of surveillance required.Vernell Leep MD [facility]  To contact the attending provider between 7A-7P or the covering provider during after hours 7P-7A, please log into the web site [email or web address] and access using universal [facility] password for that web site. If you do not have the password, please call the [facility] operator.  11/25/2019, 4:16 PM

## 2019-11-26 DIAGNOSIS — K219 Gastro-esophageal reflux disease without esophagitis: Secondary | ICD-10-CM

## 2019-11-26 DIAGNOSIS — A419 Sepsis, unspecified organism: Principal | ICD-10-CM

## 2019-11-26 DIAGNOSIS — E785 Hyperlipidemia, unspecified: Secondary | ICD-10-CM

## 2019-11-26 DIAGNOSIS — G20 Parkinson's disease: Secondary | ICD-10-CM

## 2019-11-26 DIAGNOSIS — E860 Dehydration: Secondary | ICD-10-CM

## 2019-11-26 DIAGNOSIS — G9340 Encephalopathy, unspecified: Secondary | ICD-10-CM

## 2019-11-26 DIAGNOSIS — R6520 Severe sepsis without septic shock: Secondary | ICD-10-CM

## 2019-11-26 DIAGNOSIS — R296 Repeated falls: Secondary | ICD-10-CM

## 2019-11-26 LAB — PHOSPHORUS: Phosphorus: 2.5 mg/dL (ref 2.5–4.6)

## 2019-11-26 LAB — COMPREHENSIVE METABOLIC PANEL
ALT: <5 U/L (ref 0–44)
AST: 20 U/L (ref 15–41)
Albumin: 3.1 g/dL — ABNORMAL LOW (ref 3.5–5.0)
Alkaline Phosphatase: 51 U/L (ref 38–126)
Anion gap: 8 (ref 5–15)
BUN: 20 mg/dL (ref 8–23)
CO2: 24 mmol/L (ref 22–32)
Calcium: 7.9 mg/dL — ABNORMAL LOW (ref 8.9–10.3)
Chloride: 107 mmol/L (ref 98–111)
Creatinine, Ser: 0.71 mg/dL (ref 0.44–1.00)
GFR calc Af Amer: >60 mL/min (ref >60)
GFR calc non Af Amer: >60 mL/min (ref >60)
Glucose, Bld: 110 mg/dL — ABNORMAL HIGH (ref 70–99)
Potassium: 2.9 mmol/L — ABNORMAL LOW (ref 3.5–5.1)
Sodium: 139 mmol/L (ref 135–145)
Total Bilirubin: 1.4 mg/dL — ABNORMAL HIGH (ref 0.3–1.2)
Total Protein: 5.4 g/dL — ABNORMAL LOW (ref 6.5–8.1)

## 2019-11-26 LAB — GLUCOSE, CAPILLARY: Glucose-Capillary: 86 mg/dL (ref 70–99)

## 2019-11-26 LAB — CBC
HCT: 29.6 % — ABNORMAL LOW (ref 36.0–46.0)
Hemoglobin: 9.3 g/dL — ABNORMAL LOW (ref 12.0–15.0)
MCH: 31.6 pg (ref 26.0–34.0)
MCHC: 31.4 g/dL (ref 30.0–36.0)
MCV: 100.7 fL — ABNORMAL HIGH (ref 80.0–100.0)
Platelets: 220 10*3/uL (ref 150–400)
RBC: 2.94 MIL/uL — ABNORMAL LOW (ref 3.87–5.11)
RDW: 13.1 % (ref 11.5–15.5)
WBC: 12.2 10*3/uL — ABNORMAL HIGH (ref 4.0–10.5)
nRBC: 0 % (ref 0.0–0.2)

## 2019-11-26 LAB — MAGNESIUM: Magnesium: 2 mg/dL (ref 1.7–2.4)

## 2019-11-26 MED ORDER — POTASSIUM CHLORIDE CRYS ER 20 MEQ PO TBCR
40.0000 meq | EXTENDED_RELEASE_TABLET | Freq: Two times a day (BID) | ORAL | Status: AC
  Administered 2019-11-26 (×2): 40 meq via ORAL
  Filled 2019-11-26 (×2): qty 2

## 2019-11-26 MED ORDER — HYDRALAZINE HCL 20 MG/ML IJ SOLN
10.0000 mg | Freq: Four times a day (QID) | INTRAMUSCULAR | Status: DC | PRN
  Administered 2019-11-26 – 2019-11-28 (×3): 10 mg via INTRAVENOUS
  Filled 2019-11-26 (×3): qty 1

## 2019-11-26 MED ORDER — SODIUM CHLORIDE 0.9 % IV SOLN: INTRAVENOUS | Status: DC
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
## 2019-11-26 NOTE — Progress Notes (Signed)
PROGRESS NOTE    Elizabeth Mccullough  YWV:[id number] DOB: 07-19-1941 DOA: 11/25/2019 PCP: Ann Held, DO   Brief Narrative:  HPI per Dr. Vernell Leep on 11/25/19 Elizabeth Mccullough is a 79 year old female, resident of an independent living facility, PMH of Parkinson's disease, suspected dementia, hyperlipidemia, hypertension, chronic combined systolic and diastolic CHF, AAA, TAA, GERD, anxiety and depression, presented to the [facility] ED on 11/25/2019 via EMS following a fall shortly prior to arrival.  Patient is confused and is unable to provide any history at this time.  No family at bedside.  History obtained from EDP discussion.  She reportedly was walking without her walker, lost her balance and fell forward.  On initial evaluation, complained of pain to the right side of her forehead.  She reported several days of dysuria.  She denied other history of recent illness.  She had denied fever, chills, abdominal pain, cough, chest pain, dyspnea, numbness, weakness, vision changes, neck/back pain, nausea, vomiting, diarrhea, dizziness, syncope or any other complaints.  EDP discussed with patient's daughter who indicated that the description provided of the patient's behavior at that time was typical for her, especially in times of stress.  She indicated that patient would have episodes of confusion and.'s of time where she just will not answer questions.  However she also noted that especially when the patient has a UTI, she will get much more confused.  She also stated that many times patient will create symptoms seemingly for the sake of attention.  Family have been suspicious of possible developing dementia over the last several years.  EDP attempted to ambulate the patient, she was quite unsteady on her feet, even using a walker and EDP and nurse for support on either side.  She seemed overall quite weak.  She also seemed more confused than she was initially.  During my  evaluation, patient however was distinctly worse.  She was much more confused, not oriented to person place or time.  Followed simple instructions.  Appeared slightly restless and fidgety, attempting to get out of bed.  Unable to provide any history.  ED Course: Initial temperature 99.2 which over a couple of hours at my evaluation increased to 103 F (by mouth), tachypneic in the mid 20s, tachycardic in the low 100s, blood pressures elevated in the 170s/80s-90s, not hypoxic.  Lab work significant for BUN 24, WBC 14.3, predominantly neutrophilic, hemoglobin 85.4, INR 1.1, flu panel and COVID-19 RT-PCR negative.  Lactate 1.2.  Urine microscopy showed hazy appearance, 100 protein, rare bacteria, 11-20 red blood cell and >50 white blood cells.  Blood and urine cultures pending.  Since she had reported some left hip pain, x-ray of pelvis and hip done, stable areas of osteoarthritic change, most notably in the right hip joint.  No fracture or dislocation.  CT head: No evidence of acute intracranial abnormality.  Stable generalized parenchymal atrophy and chronic small vessel ischemic disease.  Mild ethmoid and maxillary sinus mucosal thickening.  **Interim History  She still continues to be somewhat confused and sepsis physiology stop her/orbit as she is febrile today.  We will continue antibiotics and follow-up on cultures.  She is stable to be transferred out of the stepdown unit to the medical floor.  Assessment & Plan:   Principal Problem:   Sepsis ([place]) Active Problems:   Hyperlipidemia   GERD   Falls frequently   Parkinson disease ([place])   Chronic combined systolic and diastolic CHF (congestive heart failure) ([place])  Acute cystitis   Acute metabolic encephalopathy   Anxiety and depression   Dehydration  Sepsis, POA, possibly due to acute cystitis -Presented and met sepsis criteria as she was febrile, tachycardic, tachypneic, slightly hypotensive, as well as having a leukocytosis and a source  of infection in the urine -Urinalysis showed a hazy appearance with moderate hemoglobin, 5 ketones, moderate leukocytes, 100 protein, rare bacteria, 11-20 RBCs per high-power field, 0-5 squamous epithelial cells, and greater than 50 WBCs -She is given 1750 mL bolus in the ED and she had normal saline at 50 MLS per hour for 16 hours as maintenance.  Will hold further IV fluid hydration given that she does have a history of chronic combined systolic and diastolic CHF and avoidance to volume overload the patient -Urine culture in mid April 2021 had shown gram-positive cocci but there were no final results.     -Dr. Algis Liming discussed with infectious disease MD on call and agree with continuing ceftriaxone but added IV vancomycin to cover for Enterococcus plus/- MRSA.   -Treated per sepsis protocol with IV fluids and antibiotics.   -Once cultures are back, rapidly de-escalate appropriately.   -Check chest x-ray to ensure no pneumonia and showed "Widening of the superior mediastinum on the right. Correlation with contrast-enhanced chest CT is recommended, as aneurysmal dilatation of the ascending thoracic aorta cannot be excluded.  No acute or active cardiopulmonary disease" -Sepsis physiology persists little bit as she was febrile this morning the T-max of 100.7 but her tachycardia is improved but she is still slightly tachypneic -He is trending down and went from 14.3 is now 12.2 -Urine culture showing greater than 100,000 colony-forming units of gram-negative rods -Blood cultures x2 less than 24 hours -Continue to monitor WBC and temperature curve -Check procalcitonin level in the a.m. -Of note SARS-CoV-2 testing and influenza AMB PCR were negative  Dehydration -Secondary to sepsis.  -Given IV fluids as above.   -Continue to Hold Lasix temporarily.  Acute metabolic encephalopathy -Secondary to sepsis complicating underlying Parkinson's and possible dementia.   -Head CT showed "No evidence of  acute intracranial abnormality. Stable generalized parenchymal atrophy and chronic small vessel ischemic disease. Mild ethmoid and maxillary sinus mucosal thickening." -Treat underlying cause.    -C/w Delirium precautions.   -Reorient frequently -Avoid sedatives and opioids.  -Check TSH, RPR, B12, and maybe Ammonia Level   Mechanical Fall -May be precipitated by UTI, sepsis complicating underlying Parkinson's disease with gait instability and dementia.  -Obtain PT and OT evaluation.  Parkinson's disease -Continue Carbidopa-Levodopa ER 1 tab p.o. 3 times daily.  Chronic combined systolic and diastolic CHF: -Clinically was dehydrated.   -Temporarily hold Lasix.  -Continue with losartan 25 mg p.o. daily -Continue prior home dose of beta-blockers with bisoprolol 2.5 mg p.o. daily -Strict I's and O's, daily weights and continue to monitor for signs and symptoms of volume overload -Patient is +33 3 mL since admission and patient was 130.95 pounds and was not repeated this morning -Continue to follow clinically  Essential Hypertension -Varying on admission -BP is now 137/62 but had a BP as high as 180/64.   -Continue bisoprolol and ARB. -Continue to Monitor BP per protocol and if necessary will place on IV Hydralazine prn  Hyperlipidemia -Continue with Pravastatin 20 mg po Daily.  Anxiety and Depression -Continue Venlafaxine XR 150 mg 24 hour Capsule.  Hypokalemia -Patient's potassium level this morning was 29 -Replete with po KCl 40 mEQ BID x2 Doses -Check Mag Level in the AM -  Continue to Monitor and Replete as Necessary -Repeat CMP in the AM   Hyperbilirubinemia -Mild as T bili was 1.4 -Continue to Monitor and Trend -Repeat CMP in the AM   Chronic Headaches -As reported by patient's daughter.   -Reportedly worked up outpatient and negative.   -Unclear etiology.   -Will need to consider outpatient neurology consultation and follow-up as she also has  Parkinsons.  Thoracic Aortic Aneurysm -Dr. Algis Liming discussed and reviewed with reading radiologist, the widening mediastinum likely related to her known thoracic aortic aneurysm, no acute findings on this imaging -Appears to follow outpatient with TCTS  Macrocytic Anemia -Patient's hemoglobin/hematocrit dropped from 11.9/36.8 and is now 9.3/29.6 with an MCV of 100.7 -Check anemia panel in a.m. -Continue to monitor for signs and symptoms of bleeding; currently no overt bleeding noted -Repeat CBC in a.m.  DVT prophylaxis: Enoxaparin 40 mg sq q24h Code Status: FULL CODE Family Communication: No family present at bedside  Disposition Plan: Patient continues remain a little bit confused but she is stable to be transferred out of the ICU to the medical floor.  PT OT still needs to evaluate her for safe discharge disposition and we need to figure out if she is from an ALF or SNF.  Status is: Inpatient  Remains inpatient appropriate because:Altered mental status, Unsafe d/c plan, IV treatments appropriate due to intensity of illness or inability to take PO and Inpatient level of care appropriate due to severity of illness   Dispo: The patient is from: SNF              Anticipated d/c is to: SNF              Anticipated d/c date is: 2 days              Patient currently is not medically stable to d/c.  Consultants:   None  Procedures:  None   Antimicrobials:  Anti-infectives (From admission, onward)   Start     Dose/Rate Route Frequency Ordered Stop   11/26/19 1800  vancomycin (VANCOREADY) IVPB 750 mg/150 mL     750 mg 150 mL/hr over 60 Minutes Intravenous Every 24 hours 11/25/19 1647     11/26/19 1000  cefTRIAXone (ROCEPHIN) 1 g in sodium chloride 0.9 % 100 mL IVPB     1 g 200 mL/hr over 30 Minutes Intravenous Every 24 hours 11/25/19 1616     11/25/19 1645  vancomycin (VANCOREADY) IVPB 1250 mg/250 mL     1,250 mg 166.7 mL/hr over 90 Minutes Intravenous STAT 11/25/19 1642  11/25/19 1928   11/25/19 1315  cefTRIAXone (ROCEPHIN) 1 g in sodium chloride 0.9 % 100 mL IVPB     1 g 200 mL/hr over 30 Minutes Intravenous  Once 11/25/19 1304 11/25/19 1403     Subjective: Seen and examined at bedside and she was sitting in chair and appears little confused.  Thought she was in the hospital for 4 days.  No nausea or vomiting.  Feels okay.  Denies any lightheadedness or dizziness.  No other concerns or complaints at this time.  Objective: Vitals:   11/26/19 1200 11/26/19 1204 11/26/19 1242 11/26/19 1300  BP:  137/62    Pulse: 78 73  73  Resp: 17 (!) 24  (!) 27  Temp:   99 F (37.2 C)   TempSrc:   Oral   SpO2: 98% 100%  99%  Weight:      Height:        Intake/Output  Summary (Last 24 hours) at 11/26/2019 1446 Last data filed at 11/26/2019 1235 Gross per 24 hour  Intake 1083.66 ml  Output 1350 ml  Net -266.34 ml   Filed Weights   11/25/19 1635  Weight: 59.4 kg   Examination: Physical Exam:  Constitutional: The patient is a thin elderly Caucasian female currently in no acute distress who is currently confused and slightly demented Eyes: Lids and conjunctivae normal, sclerae anicteric  ENMT: External Ears, Nose appear normal. Grossly normal hearing.  Neck: Appears normal, supple, no cervical masses, normal ROM, no appreciable thyromegaly; no JVD Respiratory: Diminished to auscultation bilaterally, no wheezing, rales, rhonchi or crackles.  She is slightly tachypneic but is not using any accessory muscle use.  Unlabored breathing Cardiovascular: RRR, no murmurs / rubs / gallops. S1 and S2 auscultated. No extremity edema.  Abdomen: Soft, non-tender, non-distended. Bowel sounds positive.  GU: Deferred. Musculoskeletal: No clubbing / cyanosis of digits/nails. No joint deformity upper and lower extremities.  Skin: No rashes, lesions, ulcers or limited skin evaluation. No induration; Warm and dry.  Neurologic: CN 2-12 grossly intact with no focal deficits but she  does have some parkinsonian tremors. Romberg sign and cerebellar reflexes not assessed.  Psychiatric: Impaired judgment and insight.  Awake and alert and only oriented to herself.  Normal mood and appropriate affect.   Data Reviewed: I have personally reviewed following labs and imaging studies  CBC: Recent Labs  Lab 11/25/19 1051 11/26/19 0242  WBC 14.3* 12.2*  NEUTROABS 11.9*  --   HGB 11.9* 9.3*  HCT 36.8 29.6*  MCV 99.5 100.7*  PLT 262 998   Basic Metabolic Panel: Recent Labs  Lab 11/25/19 1051 11/26/19 0242  NA 138 139  K 3.5 2.9*  CL 101 107  CO2 27 24  GLUCOSE 116* 110*  BUN 24* 20  CREATININE 0.91 0.71  CALCIUM 8.9 7.9*  MG  --  2.0  PHOS  --  2.5   GFR: Estimated Creatinine Clearance: 43.6 mL/min (by C-G formula based on SCr of 0.71 mg/dL). Liver Function Tests: Recent Labs  Lab 11/25/19 1617 11/26/19 0242  AST 21 20  ALT 14 <5  ALKPHOS 67 51  BILITOT 1.4* 1.4*  PROT 7.3 5.4*  ALBUMIN 4.3 3.1*   No results for input(s): LIPASE, AMYLASE in the last 168 hours. No results for input(s): AMMONIA in the last 168 hours. Coagulation Profile: Recent Labs  Lab 11/25/19 1516  INR 1.1   Cardiac Enzymes: No results for input(s): CKTOTAL, CKMB, CKMBINDEX, TROPONINI in the last 168 hours. BNP (last 3 results) No results for input(s): PROBNP in the last 8760 hours. HbA1C: No results for input(s): HGBA1C in the last 72 hours. CBG: Recent Labs  Lab 11/26/19 0814  GLUCAP 86   Lipid Profile: No results for input(s): CHOL, HDL, LDLCALC, TRIG, CHOLHDL, LDLDIRECT in the last 72 hours. Thyroid Function Tests: No results for input(s): TSH, T4TOTAL, FREET4, T3FREE, THYROIDAB in the last 72 hours. Anemia Panel: No results for input(s): VITAMINB12, FOLATE, FERRITIN, TIBC, IRON, RETICCTPCT in the last 72 hours. Sepsis Labs: Recent Labs  Lab 11/25/19 1516 11/25/19 1809  LATICACIDVEN 1.2 1.2    Recent Results (from the past 240 hour(s))  Urine culture      Status: Abnormal (Preliminary result)   Collection Time: 11/25/19 10:51 AM   Specimen: Urine, Random  Result Value Ref Range Status   Specimen Description   Final    URINE, RANDOM Performed at [facility]  [address] [place], [place] [address]    Special Requests   Final    NONE Performed at [facility], [place] [address] [place], [place] [address]    Culture (A)  Final    >=100,000 COLONIES/mL GRAM NEGATIVE RODS SUSCEPTIBILITIES TO FOLLOW Performed at [facility], [place] [address], [place], [place] [address]    Report Status PENDING  Incomplete  Respiratory Panel by RT PCR (Flu A&B, Covid) - Nasopharyngeal Swab     Status: None   Collection Time: 11/25/19  2:29 PM   Specimen: Nasopharyngeal Swab  Result Value Ref Range Status   SARS Coronavirus 2 by RT PCR NEGATIVE NEGATIVE Final    Comment: (NOTE) SARS-CoV-2 target nucleic acids are NOT DETECTED. The SARS-CoV-2 RNA is generally detectable in upper respiratoy specimens during the acute phase of infection. The lowest concentration of SARS-CoV-2 viral copies this assay can detect is 131 copies/mL. A negative result does not preclude SARS-Cov-2 infection and should not be used as the sole basis for treatment or other patient management decisions. A negative result may occur with  improper specimen collection/handling, submission of specimen other than nasopharyngeal swab, presence of viral mutation(s) within the areas targeted by this assay, and inadequate number of viral copies (<131 copies/mL). A negative result must be combined with clinical observations, patient history, and epidemiological information. The expected result is Negative. Fact Sheet for Patients:  PinkCheek.be Fact Sheet for Healthcare Providers:  GravelBags.it This test is not yet ap proved or cleared by the [place] FDA and  has been authorized for  detection and/or diagnosis of SARS-CoV-2 by FDA under an Emergency Use Authorization (EUA). This EUA will remain  in effect (meaning this test can be used) for the duration of the COVID-19 declaration under Section 564(b)(1) of the Act, 21 U.S.C. section 360bbb-3(b)(1), unless the authorization is terminated or revoked sooner.    Influenza A by PCR NEGATIVE NEGATIVE Final   Influenza B by PCR NEGATIVE NEGATIVE Final    Comment: (NOTE) The Xpert Xpress SARS-CoV-2/FLU/RSV assay is intended as an aid in  the diagnosis of influenza from Nasopharyngeal swab specimens and  should not be used as a sole basis for treatment. Nasal washings and  aspirates are unacceptable for Xpert Xpress SARS-CoV-2/FLU/RSV  testing. Fact Sheet for Patients: PinkCheek.be Fact Sheet for Healthcare Providers: GravelBags.it This test is not yet approved or cleared by the [place] FDA and  has been authorized for detection and/or diagnosis of SARS-CoV-2 by  FDA under an Emergency Use Authorization (EUA). This EUA will remain  in effect (meaning this test can be used) for the duration of the  Covid-19 declaration under Section 564(b)(1) of the Act, 21  U.S.C. section 360bbb-3(b)(1), unless the authorization is  terminated or revoked. Performed at [facility], [place] [address], [place], [place] [address]   Blood Culture (routine x 2)     Status: None (Preliminary result)   Collection Time: 11/25/19  3:16 PM   Specimen: BLOOD  Result Value Ref Range Status   Specimen Description   Final    BLOOD BLOOD RIGHT FOREARM Performed at [facility] [address], [place], [place] [address]    Special Requests   Final    BOTTLES DRAWN AEROBIC AND ANAEROBIC Blood Culture results may not be optimal due to an excessive volume of blood received in culture bottles Performed at [facility] [address], [place],  [address]    Culture  Final    NO GROWTH < 24 HOURS Performed at [facility], [place] [address] [place], [place] [address]    Report Status PENDING  Incomplete  Blood Culture (routine x 2)     Status: None (Preliminary result)   Collection Time: 11/25/19  3:16 PM   Specimen: BLOOD  Result Value Ref Range Status   Specimen Description   Final    BLOOD RIGHT ANTECUBITAL Performed at [facility] [address] [place], [place] [address]    Special Requests   Final    BOTTLES DRAWN AEROBIC AND ANAEROBIC Blood Culture adequate volume Performed at [facility] [address], [place], [place] [address]    Culture   Final    NO GROWTH < 24 HOURS Performed at [facility] [address] [place], [place] [address]    Report Status PENDING  Incomplete  MRSA PCR Screening     Status: None   Collection Time: 11/25/19  4:18 PM   Specimen: Nasal Mucosa; Nasopharyngeal  Result Value Ref Range Status   MRSA by PCR NEGATIVE NEGATIVE Final    Comment:        The GeneXpert MRSA Assay (FDA approved for NASAL specimens only), is one component of a comprehensive MRSA colonization surveillance program. It is not intended to diagnose MRSA infection nor to guide or monitor treatment for MRSA infections. Performed at [facility], [place] [address] [place],  [address]      RN Pressure Injury Documentation:     Estimated body mass index is 23.95 kg/m as calculated from the following:   Height as of this encounter: 5' 2"  (1.575 m).   Weight as of this encounter: 59.4 kg.  Malnutrition Type:      Malnutrition Characteristics:      Nutrition Interventions:    Radiology Studies: CT Head Wo Contrast  Result Date: 11/25/2019 CLINICAL DATA:  Head trauma, minor. Additional provided: Fall from standing position, hit right-side of head on tray table. EXAM: CT HEAD WITHOUT CONTRAST TECHNIQUE:  Contiguous axial images were obtained from the base of the skull through the vertex without intravenous contrast. COMPARISON:  Brain MRI 11/05/2019, head CT 08/24/2019. FINDINGS: Brain: Ill-defined hypoattenuation within the cerebral white matter is nonspecific, but consistent with chronic small vessel ischemic disease. Stable, moderate generalized parenchymal atrophy. There is no acute intracranial hemorrhage. No demarcated cortical infarct. No extra-axial fluid collection. No evidence of intracranial mass. No midline shift. Vascular: No hyperdense vessel.  Atherosclerotic calcifications. Skull: Normal. Negative for fracture or focal lesion. Sinuses/Orbits: Visualized orbits show no acute finding. Mild ethmoid and maxillary sinus mucosal thickening. No significant mastoid effusion. IMPRESSION: 1. No evidence of acute intracranial abnormality. 2. Stable generalized parenchymal atrophy and chronic small vessel ischemic disease. 3. Mild ethmoid and maxillary sinus mucosal thickening. Electronically Signed   By: Kellie Simmering DO   On: 11/25/2019 11:39   DG CHEST PORT 1 VIEW  Result Date: 11/25/2019 CLINICAL DATA:  UTI, sepsis. EXAM: PORTABLE CHEST 1 VIEW COMPARISON:  February 08, 2017 FINDINGS: The lungs are hyperinflated. There is no evidence of acute infiltrate, pleural effusion or pneumothorax. The cardiac silhouette is within normal limits. Widening of the superior mediastinum is seen on the right. This extends to involve the right hilum. Multilevel degenerative changes seen throughout the thoracic spine. IMPRESSION: 1. Widening of the superior mediastinum on the right. Correlation with contrast-enhanced chest CT is recommended, as aneurysmal dilatation of the ascending thoracic  aorta cannot be excluded. 2. No acute or active cardiopulmonary disease. Electronically Signed   By: Virgina Norfolk M.D.   On: 11/25/2019 16:51   DG Hip Unilat W or Wo Pelvis 2-3 Views Left  Result Date: 11/25/2019 CLINICAL DATA:  Pain  following fall EXAM: DG HIP (WITH OR WITHOUT PELVIS) 2-3V LEFT COMPARISON:  February 27, 2012 FINDINGS: Frontal pelvis as well as frontal and lateral left hip images were obtained. No acute fracture or dislocation. There is moderately severe joint space narrowing on the right with milder narrowing on the left, unchanged. No erosive changes. Bony overgrowth is noted in the pubic symphysis region, stable. There is degenerative change in the lower lumbar spine, stable. 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 No fracture or dislocation. Electronically Signed   By: Lowella Grip III M.D.   On: 11/25/2019 11:45   Scheduled Meds:  aspirin EC  81 mg Oral Daily   bisoprolol  2.5 mg Oral Daily   Carbidopa-Levodopa ER  1 tablet Oral TID   Chlorhexidine Gluconate Cloth  6 each Topical Daily   enoxaparin (LOVENOX) injection  40 mg Subcutaneous Q24H   losartan  25 mg Oral Daily   potassium chloride  40 mEq Oral BID   pravastatin  20 mg Oral q1800   sodium chloride flush  3 mL Intravenous Q12H   venlafaxine XR  150 mg Oral q morning - 10a   Continuous Infusions:  cefTRIAXone (ROCEPHIN)  IV Stopped (11/26/19 0949)   vancomycin      LOS: 1 day   Kerney Elbe, DO [facility] PAGER is on AMION  If 7PM-7AM, please contact night-coverage [email or web address]

## 2019-11-26 NOTE — Evaluation (Addendum)
Physical Therapy Evaluation Patient Details Name: Elizabeth Mccullough MRN: [id number] DOB: April 25, 1941 Today's Date: 11/26/2019   History of Present Illness  Elizabeth Mccullough is a 79 y.o. female, with a history of AAA, CAD, CHF, hyperlipidemia, Parkinson's, suspected dementia per family presenting to the ED from an independent living facility for evaluation following a fall (walking without walker). Found to have UTI, Acute metabolic encephalopathy due to sepsis  Clinical Impression  The patient presents with  Decreased functional independence from her baseline( per daughter conversation with OT). Patient resides in independent Living ande was ambulatory. Patient is confused about some functional history. Patient should progress to return to facility with increased assistance initially.Pt admitted with above diagnosis.  Pt currently with functional limitations due to the deficits listed below (see PT Problem List). Pt will benefit from skilled PT to increase their independence and safety with mobility to allow discharge to the venue listed below.        Follow Up Recommendations Home health PT, as long as facility can provide the  Needed level of assistance, if not then recommend SNF.    Equipment Recommendations  None recommended by PT    Recommendations for Other Services       Precautions / Restrictions Precautions Precautions: Fall Restrictions Weight Bearing Restrictions: No      Mobility  Bed Mobility Overal bed mobility: Needs Assistance Bed Mobility: Supine to Sit     Supine to sit: Mod assist;+2 for physical assistance;HOB elevated     General bed mobility comments: Pt able to get half way up to sit at EOB, but then need A to get completely to EOB  Transfers Overall transfer level: Needs assistance Equipment used: 2 person hand held assist Transfers: Sit to/from Stand;Stand Pivot Transfers Sit to Stand: Min assist;+2 physical assistance Stand pivot transfers:  Min assist;+2 physical assistance       General transfer comment: Assist to rise from bed and BSC with HHA, pt. holding to armrest O BSC then to recliner with psmall shuffle pivotal steps  Ambulation/Gait                Stairs            Wheelchair Mobility    Modified Rankin (Stroke Patients Only)       Balance Overall balance assessment: Needs assistance Sitting-balance support: No upper extremity supported;Feet supported Sitting balance-Leahy Scale: Good     Standing balance support: Bilateral upper extremity supported;During functional activity Standing balance-Leahy Scale: Poor Standing balance comment: requires UE assistance for  support                             Pertinent Vitals/Pain Pain Assessment: Faces Faces Pain Scale: Hurts a little bit Pain Location: right side of head and neck Pain Descriptors / Indicators: Aching;Sore Pain Intervention(s): Monitored during session    Home Living Family/patient expects to be discharged to:: Private residence(independent living) Living Arrangements: Alone Available Help at Discharge: Available PRN/intermittently Type of Home: Independent living facility Home Access: Level entry     Home Layout: One level Home Equipment: Walker - 4 wheels;Grab bars - toilet;Grab bars - tub/shower;Shower seat Additional Comments: Per speaking with dtr, staff at [facility] come in and check on patient 3 times a day and help her with whatever she needs A with    Prior Function Level of Independence: Independent with assistive device(s)         Comments:  Per daughter pt can bath, dress, toilet herself, ambulates to eating area (with and without rollator), takes her own pills (that dtr puts in pill box for her).     Hand Dominance   Dominant Hand: Right    Extremity/Trunk Assessment   Upper Extremity Assessment Upper Extremity Assessment: Generalized weakness(noted tremors of UE's)    Lower  Extremity Assessment Lower Extremity Assessment: Generalized weakness    Cervical / Trunk Assessment Cervical / Trunk Assessment: Normal  Communication   Communication: No difficulties  Cognition Arousal/Alertness: Awake/alert Behavior During Therapy: Flat affect Overall Cognitive Status: Impaired/Different from baseline Area of Impairment: Orientation;Awareness;Safety/judgement                 Orientation Level: [id number], Feb)       Safety/Judgement: Decreased awareness of safety Awareness: Intellectual   General Comments: Pt could tell us she was in the hospital, but then would say things like "my brush is over there in my drawer"      General Comments      Exercises     Assessment/Plan    PT Assessment Patient needs continued PT services  PT Problem List Decreased strength;Decreased balance;Decreased cognition;Decreased knowledge of precautions;Decreased mobility;Decreased knowledge of use of DME;Decreased activity tolerance;Decreased safety awareness       PT Treatment Interventions DME instruction;Functional mobility training;Gait training;Therapeutic activities;Therapeutic exercise;Cognitive remediation;Patient/family education    PT Goals (Current goals can be found in the Care Plan section)  Acute Rehab PT Goals Patient Stated Goal: agreeable to get up and OOB PT Goal Formulation: With patient Time For Goal Achievement: 11/26/19 Potential to Achieve Goals: Good    Frequency Min 3X/week   Barriers to discharge        Co-evaluation PT/OT/SLP Co-Evaluation/Treatment: Yes Reason for Co-Treatment: For patient/therapist safety;To address functional/ADL transfers PT goals addressed during session: Mobility/safety with mobility OT goals addressed during session: ADL's and self-care       AM-PAC PT "6 Clicks" Mobility  Outcome Measure Help needed turning from your back to your side while in a flat bed without using bedrails?: A Lot Help needed  moving from lying on your back to sitting on the side of a flat bed without using bedrails?: A Lot Help needed moving to and from a bed to a chair (including a wheelchair)?: A Lot Help needed standing up from a chair using your arms (e.g., wheelchair or bedside chair)?: A Lot Help needed to walk in hospital room?: A Lot Help needed climbing 3-5 steps with a railing? : Total 6 Click Score: 11    End of Session Equipment Utilized During Treatment: Gait belt Activity Tolerance: Patient tolerated treatment well Patient left: in chair;with call bell/phone within reach;with chair alarm set Nurse Communication: Mobility status PT Visit Diagnosis: Unsteadiness on feet (R26.81);Difficulty in walking, not elsewhere classified (R26.2);Pain    Time: [id number] PT Time Calculation (min) (ACUTE ONLY): 23 min   Charges:   PT Evaluation $PT Eval Low Complexity: [facility] Acute Rehabilitation Services Pager [phone] Office [phone]   Claretha Cooper 11/26/2019, 12:53 PM

## 2019-11-26 NOTE — Evaluation (Signed)
Occupational Therapy Evaluation Patient Details Name: Elizabeth Mccullough MRN: [id number] DOB: 11/19/1940 Today's Date: 11/26/2019    History of Present Illness Elizabeth Mccullough is a 79 y.o. female, with a history of AAA, CAD, CHF, hyperlipidemia, Parkinson's, suspected dementia per family presenting to the ED from an independent living facility for evaluation following a fall (walking without walker). Found to have UTI, Acute metabolic encephalopathy due to sepsis   Clinical Impression   This 79 yo female admitted with above presents to acute OT with PLOF of being independent to Mod I with basic ADLs. Currently pt is setup/S- Mod A for basic ADLs with a little confusion. She will benefit from acute OT with follow up back at [place] [facility] for [facility] as long as she can have increased A in the beginning for basic ADLs prn--otherwise she will need ALF/SNF.    Follow Up Recommendations  Home health OT;Other (comment)(as along as ILF can provide A in beginning for basic ADLs prn)    Equipment Recommendations  None recommended by OT       Precautions / Restrictions Precautions Precautions: Fall Restrictions Weight Bearing Restrictions: No      Mobility Bed Mobility Overal bed mobility: Needs Assistance Bed Mobility: Supine to Sit     Supine to sit: Mod assist;+2 for physical assistance;HOB elevated     General bed mobility comments: Pt able to get half way up to sit at EOB, but then need A to get completely to EOB  Transfers Overall transfer level: Needs assistance Equipment used: 2 person hand held assist Transfers: Sit to/from [facility] Sit to Stand: Min assist;+2 physical assistance Stand pivot transfers: Min assist;+2 physical assistance            Balance Overall balance assessment: Needs assistance Sitting-balance support: No upper extremity supported;Feet supported Sitting balance-Leahy Scale: Good     Standing balance support: Bilateral upper  extremity supported Standing balance-Leahy Scale: Poor                             ADL either performed or assessed with clinical judgement   ADL Overall ADL's : Needs assistance/impaired Eating/Feeding: Independent;Sitting   Grooming: Set up;Sitting   Upper Body Bathing: Set up;Sitting   Lower Body Bathing: Minimal assistance;Sit to/from stand   Upper Body Dressing : Set up;Sitting   Lower Body Dressing: Minimal assistance;Sit to/from stand   Toilet Transfer: Minimal assistance;+2 for safety/equipment;Stand-pivot;BSC   Toileting- Clothing Manipulation and Hygiene: Moderate assistance Toileting - Clothing Manipulation Details (indicate cue type and reason): min A sit<>stand             Vision Patient Visual Report: No change from baseline              Pertinent Vitals/Pain Pain Assessment: Faces Faces Pain Scale: Hurts a little bit Pain Location: right side of head and neck Pain Descriptors / Indicators: Aching;Sore Pain Intervention(s): Limited activity within patient's tolerance;Monitored during session;Patient requesting pain meds-RN notified     Hand Dominance Right   Extremity/Trunk Assessment Upper Extremity Assessment Upper Extremity Assessment: Generalized weakness   Lower Extremity Assessment Lower Extremity Assessment: Generalized weakness       Communication Communication Communication: No difficulties   Cognition Arousal/Alertness: Awake/alert Behavior During Therapy: Flat affect Overall Cognitive Status: Impaired/Different from baseline Area of Impairment: Orientation;Awareness;Safety/judgement                 Orientation Level: [id number], Feb)  Safety/Judgement: Decreased awareness of safety Awareness: Intellectual   General Comments: Pt could tell us she was in the hospital, but then would say things like "my brush is over there in my drawer"              [place] expects to be  discharged to:: Private residence(independent living) Living Arrangements: Alone Available Help at Discharge: Available PRN/intermittently Type of Home: Independent living facility Home Access: Level entry     Home Layout: One level     Bathroom Shower/Tub: Occupational psychologist: Handicapped height     Home Equipment: Walker - 4 wheels;Grab bars - toilet;Grab bars - tub/shower;Shower seat   Additional Comments: Per speaking with dtr, staff at [facility] come in and check on patient 3 times a day and help her with whatever she needs A with      Prior Functioning/Environment Level of Independence: Independent with assistive device(s)        Comments: Per daughter pt can bath, dress, toilet herself, ambulates to eating area (with and without rollator), takes her own pills (that dtr puts in pill box for her).        OT Problem List: Decreased strength;Impaired balance (sitting and/or standing);Decreased safety awareness;Decreased cognition;Pain      OT Treatment/Interventions: Self-care/ADL training;DME and/or AE instruction;Patient/family education;Balance training    OT Goals(Current goals can be found in the care plan section) Acute Rehab OT Goals Patient Stated Goal: agreeable to get up and OOB OT Goal Formulation: With patient Time For Goal Achievement: 12/10/19 Potential to Achieve Goals: Good  OT Frequency: Min 2X/week   Barriers to D/C: Decreased caregiver support  If pt can have increased A for basic ADLs until they feel she is able to do them back at her baseline then she can return to ILF       Co-evaluation PT/OT/SLP Co-Evaluation/Treatment: Yes(partial) Reason for Co-Treatment: For patient/therapist safety PT goals addressed during session: Mobility/safety with mobility;Balance;Strengthening/ROM OT goals addressed during session: ADL's and self-care;Strengthening/ROM      AM-PAC OT "6 Clicks" Daily Activity     Outcome Measure Help  from another person eating meals?: None Help from another person taking care of personal grooming?: A Little Help from another person toileting, which includes using toliet, bedpan, or urinal?: A Lot Help from another person bathing (including washing, rinsing, drying)?: A Little Help from another person to put on and taking off regular upper body clothing?: A Little Help from another person to put on and taking off regular lower body clothing?: A Lot 6 Click Score: 17   End of Session Equipment Utilized During Treatment: Gait belt Nurse Communication: Mobility status;Patient requests pain meds(Pt requests purewick be put back in)  Activity Tolerance: Patient tolerated treatment well Patient left: in chair;with call bell/phone within reach;with chair alarm set  OT Visit Diagnosis: Unsteadiness on feet (R26.81);Other abnormalities of gait and mobility (R26.89);Muscle weakness (generalized) (M62.81);Other symptoms and signs involving cognitive function;Pain Pain - Right/Left: Right Pain - part of body: (head and neck)                Time: [id number] OT Time Calculation (min): 27 min Charges:     [place], OTR/L Acute Rehab Services Pager [phone] Office [phone]     Almon Register 11/26/2019, 10:27 AM

## 2019-11-27 ENCOUNTER — Telehealth: Payer: Medicare Other | Admitting: Physician Assistant

## 2019-11-27 ENCOUNTER — Inpatient Hospital Stay (HOSPITAL_COMMUNITY): Payer: Medicare Other

## 2019-11-27 DIAGNOSIS — B9620 Unspecified Escherichia coli [E. coli] as the cause of diseases classified elsewhere: Secondary | ICD-10-CM

## 2019-11-27 DIAGNOSIS — N390 Urinary tract infection, site not specified: Secondary | ICD-10-CM

## 2019-11-27 LAB — CBC WITH DIFFERENTIAL/PLATELET
Abs Immature Granulocytes: 0.03 10*3/uL (ref 0.00–0.07)
Basophils Absolute: 0 10*3/uL (ref 0.0–0.1)
Basophils Relative: 0 %
Eosinophils Absolute: 0.1 10*3/uL (ref 0.0–0.5)
Eosinophils Relative: 1 %
HCT: 30.5 % — ABNORMAL LOW (ref 36.0–46.0)
Hemoglobin: 9.8 g/dL — ABNORMAL LOW (ref 12.0–15.0)
Immature Granulocytes: 0 %
Lymphocytes Relative: 10 %
Lymphs Abs: 0.8 10*3/uL (ref 0.7–4.0)
MCH: 31.7 pg (ref 26.0–34.0)
MCHC: 32.1 g/dL (ref 30.0–36.0)
MCV: 98.7 fL (ref 80.0–100.0)
Monocytes Absolute: 0.9 10*3/uL (ref 0.1–1.0)
Monocytes Relative: 10 %
Neutro Abs: 6.6 10*3/uL (ref 1.7–7.7)
Neutrophils Relative %: 79 %
Platelets: 234 10*3/uL (ref 150–400)
RBC: 3.09 MIL/uL — ABNORMAL LOW (ref 3.87–5.11)
RDW: 13 % (ref 11.5–15.5)
WBC: 8.4 10*3/uL (ref 4.0–10.5)
nRBC: 0 % (ref 0.0–0.2)

## 2019-11-27 LAB — COMPREHENSIVE METABOLIC PANEL
ALT: 6 U/L (ref 0–44)
AST: 24 U/L (ref 15–41)
Albumin: 3.3 g/dL — ABNORMAL LOW (ref 3.5–5.0)
Alkaline Phosphatase: 58 U/L (ref 38–126)
Anion gap: 9 (ref 5–15)
BUN: 19 mg/dL (ref 8–23)
CO2: 23 mmol/L (ref 22–32)
Calcium: 8.2 mg/dL — ABNORMAL LOW (ref 8.9–10.3)
Chloride: 106 mmol/L (ref 98–111)
Creatinine, Ser: 0.69 mg/dL (ref 0.44–1.00)
GFR calc Af Amer: >60 mL/min (ref >60)
GFR calc non Af Amer: >60 mL/min (ref >60)
Glucose, Bld: 103 mg/dL — ABNORMAL HIGH (ref 70–99)
Potassium: 3.7 mmol/L (ref 3.5–5.1)
Sodium: 138 mmol/L (ref 135–145)
Total Bilirubin: 0.8 mg/dL (ref 0.3–1.2)
Total Protein: 6 g/dL — ABNORMAL LOW (ref 6.5–8.1)

## 2019-11-27 LAB — PHOSPHORUS: Phosphorus: 2 mg/dL — ABNORMAL LOW (ref 2.5–4.6)

## 2019-11-27 LAB — SARS CORONAVIRUS 2 BY RT PCR (HOSPITAL ORDER, PERFORMED IN ~~LOC~~ HOSPITAL LAB): SARS Coronavirus 2: NEGATIVE

## 2019-11-27 LAB — URINE CULTURE: Culture: >=100000 — AB

## 2019-11-27 LAB — MAGNESIUM: Magnesium: 1.9 mg/dL (ref 1.7–2.4)

## 2019-11-27 MED ORDER — CEFAZOLIN SODIUM-DEXTROSE 1-4 GM/50ML-% IV SOLN
1.0000 g | Freq: Three times a day (TID) | INTRAVENOUS | Status: DC
  Administered 2019-11-28: 1 g via INTRAVENOUS
  Filled 2019-11-27 (×2): qty 50

## 2019-11-27 MED ORDER — POTASSIUM CHLORIDE CRYS ER 20 MEQ PO TBCR
40.0000 meq | EXTENDED_RELEASE_TABLET | Freq: Once | ORAL | Status: AC
  Administered 2019-11-27: 40 meq via ORAL
  Filled 2019-11-27: qty 2

## 2019-11-27 MED ORDER — POTASSIUM PHOSPHATES 15 MMOLE/5ML IV SOLN
20.0000 mmol | Freq: Once | INTRAVENOUS | Status: AC
  Administered 2019-11-27: 20 mmol via INTRAVENOUS
  Filled 2019-11-27: qty 6.67

## 2019-11-27 NOTE — Progress Notes (Signed)
PROGRESS NOTE    Elizabeth Mccullough  YQI:[id number] DOB: Feb 21, 1941 DOA: 11/25/2019 PCP: Ann Held, DO  Brief Narrative:  HPI per Dr. Vernell Leep on 11/25/19 Elizabeth Mccullough is a 79 year old female, resident of an independent living facility, PMH of Parkinson's disease, suspected dementia, hyperlipidemia, hypertension, chronic combined systolic and diastolic CHF, AAA, TAA, GERD, anxiety and depression, presented to the [facility] ED on 11/25/2019 via EMS following a fall shortly prior to arrival.  Patient is confused and is unable to provide any history at this time.  No family at bedside.  History obtained from EDP discussion.  She reportedly was walking without her walker, lost her balance and fell forward.  On initial evaluation, complained of pain to the right side of her forehead.  She reported several days of dysuria.  She denied other history of recent illness.  She had denied fever, chills, abdominal pain, cough, chest pain, dyspnea, numbness, weakness, vision changes, neck/back pain, nausea, vomiting, diarrhea, dizziness, syncope or any other complaints.  EDP discussed with patient's daughter who indicated that the description provided of the patient's behavior at that time was typical for her, especially in times of stress.  She indicated that patient would have episodes of confusion and.'s of time where she just will not answer questions.  However she also noted that especially when the patient has a UTI, she will get much more confused.  She also stated that many times patient will create symptoms seemingly for the sake of attention.  Family have been suspicious of possible developing dementia over the last several years.  EDP attempted to ambulate the patient, she was quite unsteady on her feet, even using a walker and EDP and nurse for support on either side.  She seemed overall quite weak.  She also seemed more confused than she was initially.  During my  evaluation, patient however was distinctly worse.  She was much more confused, not oriented to person place or time.  Followed simple instructions.  Appeared slightly restless and fidgety, attempting to get out of bed.  Unable to provide any history.  ED Course: Initial temperature 99.2 which over a couple of hours at my evaluation increased to 103 F (by mouth), tachypneic in the mid 20s, tachycardic in the low 100s, blood pressures elevated in the 170s/80s-90s, not hypoxic.  Lab work significant for BUN 24, WBC 14.3, predominantly neutrophilic, hemoglobin 38.7, INR 1.1, flu panel and COVID-19 RT-PCR negative.  Lactate 1.2.  Urine microscopy showed hazy appearance, 100 protein, rare bacteria, 11-20 red blood cell and >50 white blood cells.  Blood and urine cultures pending.  Since she had reported some left hip pain, x-ray of pelvis and hip done, stable areas of osteoarthritic change, most notably in the right hip joint.  No fracture or dislocation.  CT head: No evidence of acute intracranial abnormality.  Stable generalized parenchymal atrophy and chronic small vessel ischemic disease.  Mild ethmoid and maxillary sinus mucosal thickening.  **Interim History  She still continued to be somewhat confused but is improving and sepsis physiology is also improved as she is not febrile today but she was slightly febrile yesterday.  We will continue antibiotics and follow-up on cultures and after discussion with ID we will stop her IV vancomycin and change to IV cefazolin and then change to p.o Keflex in the a.m.  She is stable to be transferred out of the stepdown unit to the medical floor however the bed is still pending.  We will obtain a repeat Covid test patient for discharge in next 24 to 48 hours if she is improving.  Assessment & Plan:   Principal Problem:   Sepsis ([place]) Active Problems:   Hyperlipidemia   GERD   Falls frequently   Parkinson disease ([place])   Chronic combined systolic and diastolic  CHF (congestive heart failure) (HCC)   Acute cystitis   Acute metabolic encephalopathy   Anxiety and depression   Dehydration  Sepsis, POA, possibly due to acute cystitis, improving -Presented and met sepsis criteria as she was febrile, tachycardic, tachypneic, slightly hypotensive, as well as having a leukocytosis and a source of infection in the urine -Urinalysis showed a hazy appearance with moderate hemoglobin, 5 ketones, moderate leukocytes, 100 protein, rare bacteria, 11-20 RBCs per high-power field, 0-5 squamous epithelial cells, and greater than 50 WBCs -She is given 1750 mL bolus in the ED and she had normal saline at 50 MLS per hour for 16 hours as maintenance.  Will hold further IV fluid hydration given that she does have a history of chronic combined systolic and diastolic CHF and avoidance to volume overload the patient -Urine culture in mid April 2021 had shown gram-positive cocci but there were no final results.     -Dr. Algis Liming discussed with infectious disease MD on call and agree with continuing ceftriaxone but added IV vancomycin to cover for Enterococcus plus/- MRSA.  We will now discontinue IV vancomycin given the urine cultures growing out below treatment and her clinical status -Treated per sepsis protocol with IV fluids and antibiotics.   -Once cultures are back, rapidly de-escalate appropriately.   -Check chest x-ray to ensure no pneumonia and showed "Widening of the superior mediastinum on the right. Correlation with contrast-enhanced chest CT is recommended, as aneurysmal dilatation of the ascending thoracic aorta cannot be excluded.  No acute or active cardiopulmonary disease" -Sepsis physiology persisted little bit as she was febrile yesterday morning with a T-max of 100.7 but her tachycardia is improved but she is still slightly tachypneic; She is improving and if she is Afebrile overnight likely can be D/C'd back to her ALF  -He is trending down and went from 14.3 is  now 12.2 -> 8.4 -Urine culture showing greater than 100,000 colony-forming units of gram-negative rods and is E Coli which was pansensitive -Blood cultures x2 less than at 2 Days -Continue to monitor WBC and temperature curve -Of note SARS-CoV-2 testing and influenza A/B PCR were negative -PT and OT recommending home health PT and OT as long as the ALF can provide level needed care and assistance if not then she will need SNF  Dehydration -Secondary to sepsis.  -Given IV fluids as above and is improving .   -Continue to Hold Lasix temporarily.  Acute Metabolic Encephalopathy, improving  -Secondary to sepsis complicating underlying Parkinson's and likely possible dementia.   -Head CT showed "No evidence of acute intracranial abnormality. Stable generalized parenchymal atrophy and chronic small vessel ischemic disease. Mild ethmoid and maxillary sinus mucosal thickening." -Treat underlying cause.    -She was oriented x3 today but could not remember the President  -C/w Delirium precautions.   -Reorient frequently -Avoid sedatives and opioids.  -Check TSH, RPR, B12, and maybe Ammonia Level in the AM -Continue to Monitor closely   Mechanical Fall -May be precipitated by UTI, sepsis complicating underlying Parkinson's disease with gait instability and dementia.  -Obtain PT and OT evaluation.  Parkinson's Disease -Continue Carbidopa-Levodopa ER 1 tab p.o. 3 times daily. -  Will need to follow up with Neurology in the outpatient setting.   Chronic combined systolic and diastolic CHF: -Clinically was dehydrated.   -Temporarily hold Lasix.  -Continue with losartan 25 mg p.o. daily -Continue prior home dose of beta-blockers with bisoprolol 2.5 mg p.o. daily -Strict I's and O's, daily weights and continue to monitor for signs and symptoms of volume overload -Patient is -866 mL since admission and patient was 130.95 pounds and was not repeated this morning -Continue to follow clinically  and may resume Lasix in the a.m.  Essential Hypertension -Varying on admission -BP is now 130/65 but had a BP as high as 183/92 -Continue bisoprolol and ARB. -Continue to Monitor BP per protocol and if necessary will place on IV Hydralazine prn  Hyperlipidemia -Continue with Pravastatin 20 mg po Daily.  Anxiety and Depression -Continue Venlafaxine XR 150 mg 24 hour Capsule.  Hypokalemia -Patient's potassium level this morning was 2.9 and is now 3.7 -Replete with po KCl 40 mEQ BID x2 Doses yesterday and replete with IV K Phos 20 mmol today  -Check Mag Level and was 1.9 this AM  -Continue to Monitor and Replete as Necessary -Repeat CMP in the AM   Hypophosphatemia -Patient's Phos Level is 2.0 -Replete with IV K Phos 20 mmol  -Continue to Monitor and Replete as Necessary -Repeat Phos Level in AM    Hyperbilirubinemia -Mild as T bili was 1.4 and is now 0.8 -Continue to Monitor and Trend -Repeat CMP in the AM   Chronic Headaches -As reported by patient's daughter.   -Reportedly worked up outpatient and negative.   -Unclear etiology.   -Will need to consider outpatient neurology consultation and follow-up as she also has Parkinsons.  Thoracic Aortic Aneurysm -Dr. Algis Liming discussed and reviewed with reading radiologist, the widening mediastinum likely related to her known thoracic aortic aneurysm, no acute findings on this imaging -CXR this AM showed "No active disease. Unchanged ascending thoracic aortic aneurysm." -Appears to follow outpatient with TCTS  Normocytic Anemia -Patient's hemoglobin/hematocrit dropped from 11.9/36.8 and is now 9.8/30.5 and MCV is now 98.7 -Check anemia panel in a.m. -Continue to monitor for signs and symptoms of bleeding; currently no overt bleeding noted -Repeat CBC in a.m.  DVT prophylaxis: Enoxaparin 40 mg sq q24h Code Status: FULL CODE Family Communication: No family present at bedside but I spoke with Daughter [name] via Telephone  Disposition Plan: Patient continues remain a little bit confused but she is stable to be transferred out of the ICU to the medical floor.  PT OT still needs to evaluate her for safe discharge disposition and we need to figure out if she is from an ALF or SNF.  Status is: Inpatient  Remains inpatient appropriate because:Altered mental status, Unsafe d/c plan, IV treatments appropriate due to intensity of illness or inability to take PO and Inpatient level of care appropriate due to severity of illness   Dispo: The patient is from: SNF              Anticipated d/c is to: SNF              Anticipated d/c date is: 1 day              Patient currently is not medically stable to d/c.  Consultants:   None  Procedures:  None   Antimicrobials:  Anti-infectives (From admission, onward)   Start     Dose/Rate Route Frequency Ordered Stop   11/28/19 0600  ceFAZolin (ANCEF) IVPB  1 g/50 mL premix     1 g 100 mL/hr over 30 Minutes Intravenous Every 8 hours 11/27/19 0930     11/26/19 1800  vancomycin (VANCOREADY) IVPB 750 mg/150 mL  Status:  Discontinued     750 mg 150 mL/hr over 60 Minutes Intravenous Every 24 hours 11/25/19 1647 11/27/19 1439   11/26/19 1000  cefTRIAXone (ROCEPHIN) 1 g in sodium chloride 0.9 % 100 mL IVPB  Status:  Discontinued     1 g 200 mL/hr over 30 Minutes Intravenous Every 24 hours 11/25/19 1616 11/27/19 0929   11/25/19 1645  vancomycin (VANCOREADY) IVPB 1250 mg/250 mL     1,250 mg 166.7 mL/hr over 90 Minutes Intravenous STAT 11/25/19 1642 11/25/19 1928   11/25/19 1315  cefTRIAXone (ROCEPHIN) 1 g in sodium chloride 0.9 % 100 mL IVPB     1 g 200 mL/hr over 30 Minutes Intravenous  Once 11/25/19 1304 11/25/19 1403     Subjective: Seen and examined at bedside and she is little bit more awake and alert and oriented.  Did not remember me coming into the room yesterday.  She knows her name, birthdate, where she is and why she came to the hospital and also knows the year.   No chest pain, lightheadedness or dizziness.  Feels okay and states that she feels a little bit better.  No other concerns or complaints at this time.    Objective: Vitals:   11/27/19 1000 11/27/19 1100 11/27/19 1200 11/27/19 1300  BP: 123/60 134/62 (!) 124/59 130/65  Pulse: 81 74 72 74  Resp: (!) 23 (!) 21 (!) 21 (!) 26  Temp:   98.1 F (36.7 C)   TempSrc:   Oral   SpO2: 100% 100% 100% 100%  Weight:      Height:        Intake/Output Summary (Last 24 hours) at 11/27/2019 1441 Last data filed at 11/27/2019 1400 Gross per 24 hour  Intake 250 ml  Output 1750 ml  Net -1500 ml   Filed Weights   11/25/19 1635  Weight: 59.4 kg   Examination: Physical Exam:  Constitutional: Patient is a thin elderly Caucasian female currently in no acute distress who is not as confused today and is improving.  She appears calm and she is eating her breakfast. Eyes: Lids and conjunctivae normal, sclerae anicteric  ENMT: External Ears, Nose appear normal. Grossly normal hearing.  Neck: Appears normal, supple, no cervical masses, normal ROM, no appreciable thyromegaly: No JVD Respiratory: Slightly diminished to auscultation bilaterally with no appreciable wheezing, rales, rhonchi. Normal respiratory effort and patient is not tachypenic. No accessory muscle use.  Unlabored breathing Cardiovascular: RRR, no murmurs / rubs / gallops. S1 and S2 auscultated. No extremity edema.  Abdomen: Soft, non-tender, non-distended. Bowel sounds positive.  GU: Deferred. Musculoskeletal: No clubbing / cyanosis of digits/nails. No joint deformity upper and lower extremities.  Skin: No rashes, lesions, ulcers on a limited skin evaluation. No induration; Warm and dry.  Neurologic: CN 2-12 grossly intact with no focal deficits but does have some parkinson tremors. Romberg sign and cerebellar reflexes not assessed.  Psychiatric: Normal judgment and insight. Alert and oriented x 3 today. Normal mood and appropriate affect.    Data Reviewed: I have personally reviewed following labs and imaging studies  CBC: Recent Labs  Lab 11/25/19 1051 11/26/19 0242 11/27/19 0157  WBC 14.3* 12.2* 8.4  NEUTROABS 11.9*  --  6.6  HGB 11.9* 9.3* 9.8*  HCT 36.8 29.6* 30.5*  MCV  99.5 100.7* 98.7  PLT 262 220 122   Basic Metabolic Panel: Recent Labs  Lab 11/25/19 1051 11/26/19 0242 11/27/19 0157  NA 138 139 138  K 3.5 2.9* 3.7  CL 101 107 106  CO2 _0 GLUCOSE 116* 110* 103*  BUN 24* 20 19  CREATININE 0.91 0.71 0.69  CALCIUM 8.9 7.9* 8.2*  MG  --  2.0 1.9  PHOS  --  2.5 2.0*   GFR: Estimated Creatinine Clearance: 43.6 mL/min (by C-G formula based on SCr of 0.69 mg/dL). Liver Function Tests: Recent Labs  Lab 11/25/19 1617 11/26/19 0242 11/27/19 0157  AST _1 ALT 14 <5 6  ALKPHOS 67 51 58  BILITOT 1.4* 1.4* 0.8  PROT 7.3 5.4* 6.0*  ALBUMIN 4.3 3.1* 3.3*   No results for input(s): LIPASE, AMYLASE in the last 168 hours. No results for input(s): AMMONIA in the last 168 hours. Coagulation Profile: Recent Labs  Lab 11/25/19 1516  INR 1.1   Cardiac Enzymes: No results for input(s): CKTOTAL, CKMB, CKMBINDEX, TROPONINI in the last 168 hours. BNP (last 3 results) No results for input(s): PROBNP in the last 8760 hours. HbA1C: No results for input(s): HGBA1C in the last 72 hours. CBG: Recent Labs  Lab 11/26/19 0814  GLUCAP 86   Lipid Profile: No results for input(s): CHOL, HDL, LDLCALC, TRIG, CHOLHDL, LDLDIRECT in the last 72 hours. Thyroid Function Tests: No results for input(s): TSH, T4TOTAL, FREET4, T3FREE, THYROIDAB in the last 72 hours. Anemia Panel: No results for input(s): VITAMINB12, FOLATE, FERRITIN, TIBC, IRON, RETICCTPCT in the last 72 hours. Sepsis Labs: Recent Labs  Lab 11/25/19 1516 11/25/19 1809  LATICACIDVEN 1.2 1.2    Recent Results (from the past 240 hour(s))  Urine culture     Status: Abnormal   Collection Time: 11/25/19 10:51 AM   Specimen: Urine, Random   Result Value Ref Range Status   Specimen Description   Final    URINE, RANDOM Performed at [facility] [address], [place], [place] [address]    Special Requests   Final    NONE Performed at [facility], [place] [address] [place], [place] [address]    Culture >=100,000 COLONIES/mL ESCHERICHIA COLI (A)  Final   Report Status 11/27/2019 FINAL  Final   Organism ID, Bacteria ESCHERICHIA COLI (A)  Final      Susceptibility   Escherichia coli - MIC*    AMPICILLIN 4 SENSITIVE Sensitive     CEFAZOLIN <=4 SENSITIVE Sensitive     CEFTRIAXONE <=1 SENSITIVE Sensitive     CIPROFLOXACIN <=0.25 SENSITIVE Sensitive     GENTAMICIN <=1 SENSITIVE Sensitive     IMIPENEM <=0.25 SENSITIVE Sensitive     NITROFURANTOIN <=16 SENSITIVE Sensitive     TRIMETH/SULFA <=20 SENSITIVE Sensitive     AMPICILLIN/SULBACTAM <=2 SENSITIVE Sensitive     PIP/TAZO <=4 SENSITIVE Sensitive     * >=100,000 COLONIES/mL ESCHERICHIA COLI  Respiratory Panel by RT PCR (Flu A&B, Covid) - Nasopharyngeal Swab     Status: None   Collection Time: 11/25/19  2:29 PM   Specimen: Nasopharyngeal Swab  Result Value Ref Range Status   SARS Coronavirus 2 by RT PCR NEGATIVE NEGATIVE Final    Comment: (NOTE) SARS-CoV-2 target nucleic acids are NOT DETECTED. The SARS-CoV-2 RNA is generally detectable in upper respiratoy specimens during the acute phase of infection. The lowest concentration of SARS-CoV-2 viral copies this assay can detect is 131 copies/mL. A negative result  does not preclude SARS-Cov-2 infection and should not be used as the sole basis for treatment or other patient management decisions. A negative result may occur with  improper specimen collection/handling, submission of specimen other than nasopharyngeal swab, presence of viral mutation(s) within the areas targeted by this assay, and inadequate number of viral copies (<131 copies/mL). A negative result must be combined with  clinical observations, patient history, and epidemiological information. The expected result is Negative. Fact Sheet for Patients:  PinkCheek.be Fact Sheet for Healthcare Providers:  GravelBags.it This test is not yet ap proved or cleared by the [place] FDA and  has been authorized for detection and/or diagnosis of SARS-CoV-2 by FDA under an Emergency Use Authorization (EUA). This EUA will remain  in effect (meaning this test can be used) for the duration of the COVID-19 declaration under Section 564(b)(1) of the Act, 21 U.S.C. section 360bbb-3(b)(1), unless the authorization is terminated or revoked sooner.    Influenza A by PCR NEGATIVE NEGATIVE Final   Influenza B by PCR NEGATIVE NEGATIVE Final    Comment: (NOTE) The Xpert Xpress SARS-CoV-2/FLU/RSV assay is intended as an aid in  the diagnosis of influenza from Nasopharyngeal swab specimens and  should not be used as a sole basis for treatment. Nasal washings and  aspirates are unacceptable for Xpert Xpress SARS-CoV-2/FLU/RSV  testing. Fact Sheet for Patients: PinkCheek.be Fact Sheet for Healthcare Providers: GravelBags.it This test is not yet approved or cleared by the [place] FDA and  has been authorized for detection and/or diagnosis of SARS-CoV-2 by  FDA under an Emergency Use Authorization (EUA). This EUA will remain  in effect (meaning this test can be used) for the duration of the  Covid-19 declaration under Section 564(b)(1) of the Act, 21  U.S.C. section 360bbb-3(b)(1), unless the authorization is  terminated or revoked. Performed at [facility], [place] [address] [place], [place] [address]   Blood Culture (routine x 2)     Status: None (Preliminary result)   Collection Time: 11/25/19  3:16 PM   Specimen: BLOOD  Result Value Ref Range Status   Specimen Description   Final     BLOOD BLOOD RIGHT FOREARM Performed at [facility] [address] [place], [place] [address]    Special Requests   Final    BOTTLES DRAWN AEROBIC AND ANAEROBIC Blood Culture results may not be optimal due to an excessive volume of blood received in culture bottles Performed at [facility] [address], [place], [place] [address]    Culture   Final    NO GROWTH 2 DAYS Performed at [facility] [address], [place], [place] [address]    Report Status PENDING  Incomplete  Blood Culture (routine x 2)     Status: None (Preliminary result)   Collection Time: 11/25/19  3:16 PM   Specimen: BLOOD  Result Value Ref Range Status   Specimen Description   Final    BLOOD RIGHT ANTECUBITAL Performed at [facility] [address], [place], [place] [address]    Special Requests   Final    BOTTLES DRAWN AEROBIC AND ANAEROBIC Blood Culture adequate volume Performed at [facility] [address] [place], [place] [address]    Culture   Final    NO GROWTH 2 DAYS Performed at [facility] [address], [place], [place] [address]    Report Status PENDING  Incomplete  MRSA PCR Screening  Status: None   Collection Time: 11/25/19  4:18 PM   Specimen: Nasal Mucosa; Nasopharyngeal  Result Value Ref Range Status   MRSA by PCR NEGATIVE NEGATIVE Final    Comment:        The GeneXpert MRSA Assay (FDA approved for NASAL specimens only), is one component of a comprehensive MRSA colonization surveillance program. It is not intended to diagnose MRSA infection nor to guide or monitor treatment for MRSA infections. Performed at [facility], [place] [address] [place], [place] [address]      RN Pressure Injury Documentation:     Estimated body mass index is 23.95 kg/m as calculated from the following:   Height as of this encounter: _0  (1.575 m).   Weight as of this  encounter: 59.4 kg.  Malnutrition Type:      Malnutrition Characteristics:      Nutrition Interventions:    Radiology Studies: DG CHEST PORT 1 VIEW  Result Date: 11/27/2019 CLINICAL DATA:  Shortness of breath. EXAM: PORTABLE CHEST 1 VIEW COMPARISON:  Chest x-ray dated Nov 25, 2019. FINDINGS: Normal heart size. Unchanged ascending thoracic aortic aneurysm. Normal pulmonary vascularity. No focal consolidation, pleural effusion, or pneumothorax. No acute osseous abnormality. IMPRESSION: 1. No active disease. 2. Unchanged ascending thoracic aortic aneurysm. Electronically Signed   By: Titus Dubin M.D.   On: 11/27/2019 07:41   DG CHEST PORT 1 VIEW  Result Date: 11/25/2019 CLINICAL DATA:  UTI, sepsis. EXAM: PORTABLE CHEST 1 VIEW COMPARISON:  February 08, 2017 FINDINGS: The lungs are hyperinflated. There is no evidence of acute infiltrate, pleural effusion or pneumothorax. The cardiac silhouette is within normal limits. Widening of the superior mediastinum is seen on the right. This extends to involve the right hilum. Multilevel degenerative changes seen throughout the thoracic spine. IMPRESSION: 1. Widening of the superior mediastinum on the right. Correlation with contrast-enhanced chest CT is recommended, as aneurysmal dilatation of the ascending thoracic aorta cannot be excluded. 2. No acute or active cardiopulmonary disease. Electronically Signed   By: Virgina Norfolk M.D.   On: 11/25/2019 16:51   Scheduled Meds: . aspirin EC  81 mg Oral Daily  . bisoprolol  2.5 mg Oral Daily  . Carbidopa-Levodopa ER  1 tablet Oral TID  . Chlorhexidine Gluconate Cloth  6 each Topical Daily  . enoxaparin (LOVENOX) injection  40 mg Subcutaneous Q24H  . losartan  25 mg Oral Daily  . pravastatin  20 mg Oral q1800  . sodium chloride flush  3 mL Intravenous Q12H  . venlafaxine XR  150 mg Oral q morning - 10a   Continuous Infusions: . [START ON 11/28/2019]  ceFAZolin (ANCEF) IV    . potassium PHOSPHATE  IVPB (in mmol) 20 mmol (11/27/19 0948)    LOS: 2 days   Kerney Elbe, DO [facility] PAGER is on AMION  If 7PM-7AM, please contact night-coverage [email or web address]

## 2019-11-27 NOTE — Progress Notes (Signed)
Physical Therapy Treatment Patient Details Name: Elizabeth Mccullough MRN: [id number] DOB: Jan 30, 1941 Today's Date: 11/27/2019    History of Present Illness Elizabeth Mccullough is a 79 y.o. female, with a history of AAA, CAD, CHF, hyperlipidemia, Parkinson's, suspected dementia per family presenting to the ED from an independent living facility for evaluation following a fall (walking without walker). Found to have UTI, Acute metabolic encephalopathy due to sepsis    PT Comments    The patient ambulated x 100' with mod to min assistanc, guidance  Of RW at times, multimodal cues for turns . Patient with mild wheezing audible. HR /VS stable on Ra. Plans are to return to ALF.  Follow Up Recommendations  Home health PT;Supervision/Assistance - 24 hour     Equipment Recommendations  None recommended by PT    Recommendations for Other Services       Precautions / Restrictions Precautions Precautions: Fall Precaution Comments: monitor HR, has VT runs    Mobility  Bed Mobility               General bed mobility comments: in recliner  Transfers Overall transfer level: Needs assistance Equipment used: Rolling walker (2 wheeled) Transfers: Sit to/from Stand Sit to Stand: Mod assist         General transfer comment: multimodal cues for hand placement, feet scooted forward upon standing  Ambulation/Gait Ambulation/Gait assistance: Min assist;Mod assist Gait Distance (Feet): 100 Feet Assistive device: Rolling walker (2 wheeled) Gait Pattern/deviations: Step-to pattern;Shuffle;Festinating;Step-through pattern     General Gait Details: inititially mod steady assistance, turning, through tigh spaces, Min assist in hallway.   Stairs             Wheelchair Mobility    Modified Rankin (Stroke Patients Only)       Balance Overall balance assessment: Needs assistance Sitting-balance support: Single extremity supported;Feet supported Sitting balance-Leahy Scale:  Good     Standing balance support: Bilateral upper extremity supported;During functional activity Standing balance-Leahy Scale: Poor Standing balance comment: requires UE assistance for  support                            Cognition Arousal/Alertness: Awake/alert Behavior During Therapy: Flat affect Overall Cognitive Status: Impaired/Different from baseline Area of Impairment: Orientation;Awareness;Safety/judgement                 Orientation Level: Time       Safety/Judgement: Decreased awareness of safety Awareness: Intellectual          Exercises      General Comments        Pertinent Vitals/Pain Pain Assessment: No/denies pain    Home Living                      Prior Function            PT Goals (current goals can now be found in the care plan section) Progress towards PT goals: Progressing toward goals    Frequency    Min 3X/week      PT Plan Current plan remains appropriate    Co-evaluation              AM-PAC PT "6 Clicks" Mobility   Outcome Measure  Help needed turning from your back to your side while in a flat bed without using bedrails?: A Lot Help needed moving from lying on your back to sitting on the side of a flat bed without  using bedrails?: A Lot Help needed moving to and from a bed to a chair (including a wheelchair)?: A Lot Help needed standing up from a chair using your arms (e.g., wheelchair or bedside chair)?: A Lot Help needed to walk in hospital room?: A Lot Help needed climbing 3-5 steps with a railing? : Total 6 Click Score: 11    End of Session Equipment Utilized During Treatment: Gait belt Activity Tolerance: Patient tolerated treatment well Patient left: in chair;with call bell/phone within reach;with chair alarm set Nurse Communication: Mobility status PT Visit Diagnosis: Unsteadiness on feet (R26.81);Difficulty in walking, not elsewhere classified (R26.2);Pain     Time:  [id number] PT Time Calculation (min) (ACUTE ONLY): 25 min  Charges:  $Gait Training: 23-37 mins                     [place] Pager [phone] Office [phone]    Claretha Cooper 11/27/2019, 3:05 PM

## 2019-11-27 NOTE — Progress Notes (Signed)
Pt with 5 beat run of vtach.. pt asymptomatic and other vitals remain unchanged and stable. MD made aware. Awaiting morning lab results since potassium was low yesterday.

## 2019-11-27 NOTE — TOC Progression Note (Signed)
Transition of Care [facility]) - Progression Note    Patient Details  Name: Elizabeth Mccullough MRN: [id number] Date of Birth: 1940-10-11  Transition of Care [facility]) CM/SW Contact  Leeroy Cha, RN Phone Number: 11/27/2019, 8:44 AM  Clinical Narrative:    [id number]/possible transfer to regular floor,pt is from [facility] assisted living,room air,IV rocephin, Iv vanco and iv kp04, Urine culture + for E.coli-may be cause of confusion, Bld cultures x2 x2d=neg.    Expected Discharge Plan: Assisted Living Barriers to Discharge: Continued Medical Work up  Expected Discharge Plan and Services Expected Discharge Plan: Assisted Living   Discharge Planning Services: CM Consult   Living arrangements for the past 2 months: Apartment([place])                                       Social Determinants of Health (SDOH) Interventions    Readmission Risk Interventions No flowsheet data found.

## 2019-11-28 DIAGNOSIS — G9341 Metabolic encephalopathy: Secondary | ICD-10-CM

## 2019-11-28 DIAGNOSIS — I5042 Chronic combined systolic (congestive) and diastolic (congestive) heart failure: Secondary | ICD-10-CM

## 2019-11-28 DIAGNOSIS — N3090 Cystitis, unspecified without hematuria: Secondary | ICD-10-CM

## 2019-11-28 DIAGNOSIS — F329 Major depressive disorder, single episode, unspecified: Secondary | ICD-10-CM

## 2019-11-28 DIAGNOSIS — N3000 Acute cystitis without hematuria: Secondary | ICD-10-CM

## 2019-11-28 DIAGNOSIS — F419 Anxiety disorder, unspecified: Secondary | ICD-10-CM

## 2019-11-28 LAB — VITAMIN B12: Vitamin B-12: 782 pg/mL (ref 180–914)

## 2019-11-28 LAB — COMPREHENSIVE METABOLIC PANEL
ALT: 9 U/L (ref 0–44)
AST: 29 U/L (ref 15–41)
Albumin: 3.4 g/dL — ABNORMAL LOW (ref 3.5–5.0)
Alkaline Phosphatase: 51 U/L (ref 38–126)
Anion gap: 9 (ref 5–15)
BUN: 21 mg/dL (ref 8–23)
CO2: 24 mmol/L (ref 22–32)
Calcium: 8.9 mg/dL (ref 8.9–10.3)
Chloride: 108 mmol/L (ref 98–111)
Creatinine, Ser: 0.75 mg/dL (ref 0.44–1.00)
GFR calc Af Amer: >60 mL/min (ref >60)
GFR calc non Af Amer: >60 mL/min (ref >60)
Glucose, Bld: 107 mg/dL — ABNORMAL HIGH (ref 70–99)
Potassium: 3.9 mmol/L (ref 3.5–5.1)
Sodium: 141 mmol/L (ref 135–145)
Total Bilirubin: 0.5 mg/dL (ref 0.3–1.2)
Total Protein: 6.5 g/dL (ref 6.5–8.1)

## 2019-11-28 LAB — CBC WITH DIFFERENTIAL/PLATELET
Abs Immature Granulocytes: 0.02 10*3/uL (ref 0.00–0.07)
Basophils Absolute: 0 10*3/uL (ref 0.0–0.1)
Basophils Relative: 0 %
Eosinophils Absolute: 0.2 10*3/uL (ref 0.0–0.5)
Eosinophils Relative: 3 %
HCT: 31.3 % — ABNORMAL LOW (ref 36.0–46.0)
Hemoglobin: 10.1 g/dL — ABNORMAL LOW (ref 12.0–15.0)
Immature Granulocytes: 0 %
Lymphocytes Relative: 16 %
Lymphs Abs: 0.9 10*3/uL (ref 0.7–4.0)
MCH: 32.1 pg (ref 26.0–34.0)
MCHC: 32.3 g/dL (ref 30.0–36.0)
MCV: 99.4 fL (ref 80.0–100.0)
Monocytes Absolute: 0.9 10*3/uL (ref 0.1–1.0)
Monocytes Relative: 16 %
Neutro Abs: 3.7 10*3/uL (ref 1.7–7.7)
Neutrophils Relative %: 65 %
Platelets: 259 10*3/uL (ref 150–400)
RBC: 3.15 MIL/uL — ABNORMAL LOW (ref 3.87–5.11)
RDW: 13.2 % (ref 11.5–15.5)
WBC: 5.8 10*3/uL (ref 4.0–10.5)
nRBC: 0 % (ref 0.0–0.2)

## 2019-11-28 LAB — PHOSPHORUS: Phosphorus: 3.6 mg/dL (ref 2.5–4.6)

## 2019-11-28 LAB — RETICULOCYTES
Immature Retic Fract: 8.6 % (ref 2.3–15.9)
RBC.: 3.11 MIL/uL — ABNORMAL LOW (ref 3.87–5.11)
Retic Count, Absolute: 37.6 10*3/uL (ref 19.0–186.0)
Retic Ct Pct: 1.2 % (ref 0.4–3.1)

## 2019-11-28 LAB — TSH: TSH: 2.878 u[IU]/mL (ref 0.350–4.500)

## 2019-11-28 LAB — RPR: RPR Ser Ql: NONREACTIVE

## 2019-11-28 LAB — FERRITIN: Ferritin: 444 ng/mL — ABNORMAL HIGH (ref 11–307)

## 2019-11-28 LAB — IRON AND TIBC
Iron: 49 ug/dL (ref 28–170)
Saturation Ratios: 20 % (ref 10.4–31.8)
TIBC: 250 ug/dL (ref 250–450)
UIBC: 201 ug/dL

## 2019-11-28 LAB — MAGNESIUM: Magnesium: 2 mg/dL (ref 1.7–2.4)

## 2019-11-28 LAB — AMMONIA: Ammonia: 31 umol/L (ref 9–35)

## 2019-11-28 LAB — FOLATE: Folate: 19 ng/mL (ref >5.9)

## 2019-11-28 MED ORDER — CEPHALEXIN 500 MG PO CAPS: 500.0000 mg | ORAL_CAPSULE | Freq: Two times a day (BID) | ORAL | 0 refills | Status: DC

## 2019-11-28 MED ORDER — CEPHALEXIN 500 MG PO CAPS: 500.0000 mg | ORAL_CAPSULE | Freq: Two times a day (BID) | ORAL | 0 refills | Status: AC

## 2019-11-28 NOTE — Care Management Important Message (Signed)
Important Message  Patient Details IM Letter given to Roque Lias SW Case Manager to present to the Patient Name: YARENI LINDROTH MRN: [id number] Date of Birth: 10/05/1940   Medicare Important Message Given:  Yes     Kerin Salen 11/28/2019, 10:30 AM

## 2019-11-28 NOTE — TOC Transition Note (Signed)
Transition of Care [facility]) - CM/SW Discharge Note   Patient Details  Name: NOMI MACHOWSKI MRN: [id number] Date of Birth: 1941/06/29  Transition [facility]) CM/SW Contact:  Trish Mage, LCSW Phone Number: 11/28/2019, 10:57 AM   Clinical Narrative:   Patient to d/c today; return to [place] living.  Spoke to daughter who will provide transportation at [place] to PT agency on campus who requested orders for OT/PT.  FAXed orders to [phone].  TOC sign off.    Final next level of care: [place] Barriers to Discharge: Barriers Resolved   Patient Goals and CMS Choice Patient states their goals for this hospitalization and ongoing recovery are:: to go back to my apartment CMS Medicare.gov Compare Post Acute Care list provided to:: Patient Choice offered to / list presented to : Patient  Discharge Placement                       Discharge Plan and Services   Discharge Planning Services: CM Consult                                 Social Determinants of Health (SDOH) Interventions     Readmission Risk Interventions No flowsheet data found.

## 2019-11-28 NOTE — Progress Notes (Signed)
Occupational Therapy Treatment Patient Details Name: Elizabeth Mccullough MRN: [id number] DOB: Oct 17, 1940 Today's Date: 11/28/2019    History of present illness Elizabeth Mccullough is a 79 y.o. female, with a history of AAA, CAD, CHF, hyperlipidemia, Parkinson's, suspected dementia per family presenting to the ED from an independent living facility for evaluation following a fall (walking without walker). Found to have UTI, Acute metabolic encephalopathy due to sepsis   OT comments  Unsure of patient's baseline however confused this morning, making comments suggesting she didn't eat all day yesterday and is getting her breakfast at night time, does not appear oriented to time. Patient require min A for transfer to recliner with decreased safety backing up to chair and eccentric control into chair. Will continue to follow.   Follow Up Recommendations  Home health OT(as long as ILF can provide initial A ADLs PRN)    Equipment Recommendations  None recommended by OT       Precautions / Restrictions Precautions Precautions: Fall Precaution Comments: monitor HR, has VT runs Restrictions Weight Bearing Restrictions: No       Mobility Bed Mobility Overal bed mobility: Needs Assistance Bed Mobility: Supine to Sit     Supine to sit: Min assist     General bed mobility comments: for trunk support  Transfers Overall transfer level: Needs assistance Equipment used: Rolling walker (2 wheeled) Transfers: Sit to/from Stand Sit to Stand: Min assist         General transfer comment: multimodal cues for hand placement, decreased balance when backing up to chair requiring min A to prevent fall and poor eccentric control when sitting into chair    Balance Overall balance assessment: Needs assistance Sitting-balance support: Single extremity supported;Feet supported Sitting balance-Leahy Scale: Good     Standing balance support: Bilateral upper extremity supported;During functional  activity Standing balance-Leahy Scale: Poor Standing balance comment: requires UE assistance for  support                           ADL either performed or assessed with clinical judgement   ADL Overall ADL's : Needs assistance/impaired Eating/Feeding: Independent;Sitting                   Lower Body Dressing: Maximal assistance;Sitting/lateral leans Lower Body Dressing Details (indicate cue type and reason): pt reach towards socks, unable to adjust for adhesive on bottom of feet Toilet Transfer: Minimal assistance;Ambulation;BSC;RW;Cueing for safety Toilet Transfer Details (indicate cue type and reason): simulated to recliner, decreased eccentric control         Functional mobility during ADLs: Minimal assistance;Rolling walker;Cueing for safety                 Cognition Arousal/Alertness: Awake/alert Behavior During Therapy: Flat affect Overall Cognitive Status: Impaired/Different from baseline Area of Impairment: Safety/judgement;Awareness;Orientation                 Orientation Level: Disoriented to;Time       Safety/Judgement: Decreased awareness of safety Awareness: Intellectual   General Comments: patient stating its confusing how it goes dark then light, doesn't seem aware its morning believes shes getting her breakfast at night time                   Pertinent Vitals/ Pain       Pain Assessment: No/denies pain         Frequency  Min 2X/week        Progress Toward  Goals  OT Goals(current goals can now be found in the care plan section)  Progress towards OT goals: Progressing toward goals  Acute Rehab OT Goals Patient Stated Goal: agreeable to get up and OOB OT Goal Formulation: With patient Time For Goal Achievement: 12/10/19 Potential to Achieve Goals: Good  Plan Discharge plan remains appropriate       AM-PAC OT "6 Clicks" Daily Activity     Outcome Measure   Help from another person eating meals?:  None Help from another person taking care of personal grooming?: A Little Help from another person toileting, which includes using toliet, bedpan, or urinal?: A Little Help from another person bathing (including washing, rinsing, drying)?: A Little Help from another person to put on and taking off regular upper body clothing?: A Little Help from another person to put on and taking off regular lower body clothing?: A Lot 6 Click Score: 18    End of Session Equipment Utilized During Treatment: Rolling walker  OT Visit Diagnosis: Unsteadiness on feet (R26.81);Other abnormalities of gait and mobility (R26.89);Muscle weakness (generalized) (M62.81);Other symptoms and signs involving cognitive function   Activity Tolerance Patient tolerated treatment well   Patient Left in chair;with call bell/phone within reach;with chair alarm set           Time: [id number] OT Time Calculation (min): 15 min  Charges: OT General Charges $OT Visit: 1 Visit OT Treatments $Self Care/Home Management : 8-22 mins  Delbert Phenix OT Pager: [place] 11/28/2019, 10:50 AM

## 2019-11-28 NOTE — Discharge Summary (Signed)
Physician Discharge Summary  Elizabeth Mccullough [id number] DOB: 10-29-1940 DOA: 11/25/2019  PCP: Elizabeth Held, DO  Admit date: 11/25/2019 Discharge date: 11/28/2019  Time spent: 40 minutes  Recommendations for Outpatient Follow-up:  1. Follow-up PCP in 2 weeks  Discharge Diagnoses:  Principal Problem:   Sepsis ([place]) Active Problems:   Hyperlipidemia   GERD   Falls frequently   Parkinson disease ([place])   Chronic combined systolic and diastolic CHF (congestive heart failure) ([place])   Acute cystitis   Acute metabolic encephalopathy   Anxiety and depression   Dehydration   Discharge Condition: Stable  Diet recommendation: Heart healthy diet  Filed Weights   11/25/19 1635  Weight: 59.4 kg    History of present illness:  79 year old female with medical history of Parkinson disease, dementia, hyperlipidemia, hypertension, chronic combined [place] and diastolic CHF, AAA, thoracic aortic aneurysm, GERD, anxiety and depression came to this long ED via EMS following a fall shortly prior to arrival.  Patient was found to have UTI, started on IV antibiotics.  Hospital Course:   Sepsis due to UTI-resolved, patient was found to have abnormal UA, urine culture grew E. coli sensitive to cefazolin.  Patient was started on IV cefazolin.  At this time WBC is down to 5.8.  We will discharge her on Keflex 500 mg p.o. twice daily for 7 more days.  Acute metabolic encephalopathy-resolved, secondary to above.  Patient is mentally back to baseline.  Ammonia level 31, B12 782, RPR nonreactive, TSH 2.878.  Mechanical fall-likely precipitated by UTI with underlying Parkinson disease.  PT OT was consulted, patient will go home with home health PT.  Chronic combined systolic and diastolic CHF-continue home medication including Lasix which was Mccullough in the hospital temporarily for dehydration.  Continue losartan, bisoprolol.  Hypertension-continue bisoprolol, ARB.  Chronic headaches-reportedly  worked up as outpatient with negative work-up.  Will need outpatient neurology consultation and follow-up.  She also has Parkinson's disease.  Thoracic aortic aneurysm-chest x-ray showed widened mediastinum, admitting physician discussed with radiologist.  Chest x-ray showed no active disease, unchanged ascending aortic thoracic aneurysm.  Patient follows up with the [facility] as outpatient.       Procedures:  *  Consultations:    Discharge Exam: Vitals:   11/28/19 0611 11/28/19 0845  BP: (!) 167/83 (!) 149/81  Pulse: 76 91  Resp:  16  Temp:  98.1 F (36.7 C)  SpO2:  98%    General-appears in no acute distress Heart-S1-S2, regular, no murmur auscultated Lungs-clear to auscultation bilaterally, no wheezing or crackles auscultated Abdomen-soft, nontender, no organomegaly Extremities-no edema in the lower extremities Neuro-alert, oriented x3, no focal deficit noted  Discharge Instructions   Discharge Instructions    Diet - low sodium heart healthy   Complete by: As directed    Increase activity slowly   Complete by: As directed      Allergies as of 11/28/2019      Reactions   Iohexol Rash    Code: RASH, Desc: PATIENT STATES SHE IS ALLERGIC TO IV DYE. 20 YRS AGO SHE HAD A REACTION AT [facility], PT WAS GIVEN BENADRYL. 07/13/06/RM, Onset Date: [id number]   Penicillins Hives      Medication List    TAKE these medications   Acetaminophen 325 MG Caps Take 1-2 capsules by mouth every 8 (eight) hours as needed (moderate to severe pain).   aspirin EC 81 MG tablet Take 1 tablet (81 mg total) by mouth daily.   bisoprolol 5 MG  tablet Commonly known as: ZEBETA Take 0.5 tablets (2.5 mg total) by mouth daily.   Carbidopa-Levodopa ER 25-100 MG tablet controlled release Commonly known as: SINEMET CR 2 pills 3 times per day What changed:   how much to take  how to take this  when to take this  additional instructions   cephALEXin 500 MG capsule Commonly known as:  KEFLEX Take 1 capsule (500 mg total) by mouth 2 (two) times daily for 7 days.   furosemide 20 MG tablet Commonly known as: LASIX TAKE 1 TABLET BY MOUTH DAILY.   losartan 25 MG tablet Commonly known as: COZAAR Take 1 tablet (25 mg total) by mouth daily. Please make yearly appt with Dr. Acie Fredrickson for July for future refills. 1st attempt   lovastatin 20 MG tablet Commonly known as: MEVACOR Take 1 tablet (20 mg total) by mouth at bedtime.   potassium chloride 10 MEQ tablet Commonly known as: KLOR-CON TAKE 1 TABLET BY MOUTH DAILY.   venlafaxine XR 150 MG 24 hr capsule Commonly known as: EFFEXOR-XR Take 150 mg by mouth every morning.      Allergies  Allergen Reactions  . Iohexol Rash     Code: RASH, Desc: PATIENT STATES SHE IS ALLERGIC TO IV DYE. 20 YRS AGO SHE HAD A REACTION AT [facility], PT WAS GIVEN BENADRYL. 07/13/06/RM, Onset Date: [id number]   . Penicillins Hives      The results of significant diagnostics from this hospitalization (including imaging, microbiology, ancillary and laboratory) are listed below for reference.    Significant Diagnostic Studies: CT Head Wo Contrast  Result Date: 11/25/2019 CLINICAL DATA:  Head trauma, minor. Additional provided: Fall from standing position, hit right-side of head on tray table. EXAM: CT HEAD WITHOUT CONTRAST TECHNIQUE: Contiguous axial images were obtained from the base of the skull through the vertex without intravenous contrast. COMPARISON:  Brain MRI 11/05/2019, head CT 08/24/2019. FINDINGS: Brain: Ill-defined hypoattenuation within the cerebral white matter is nonspecific, but consistent with chronic small vessel ischemic disease. Stable, moderate generalized parenchymal atrophy. There is no acute intracranial hemorrhage. No demarcated cortical infarct. No extra-axial fluid collection. No evidence of intracranial mass. No midline shift. Vascular: No hyperdense vessel.  Atherosclerotic calcifications. Skull: Normal. Negative for  fracture or focal lesion. Sinuses/Orbits: Visualized orbits show no acute finding. Mild ethmoid and maxillary sinus mucosal thickening. No significant mastoid effusion. IMPRESSION: 1. No evidence of acute intracranial abnormality. 2. Stable generalized parenchymal atrophy and chronic small vessel ischemic disease. 3. Mild ethmoid and maxillary sinus mucosal thickening. Electronically Signed   By: Kellie Simmering DO   On: 11/25/2019 11:39   MR Brain Wo Contrast  Result Date: 11/06/2019 CLINICAL DATA:  79 year old female with subacute headache, altered mental status, recurrent falls in the past month. EXAM: MRI HEAD WITHOUT CONTRAST TECHNIQUE: Multiplanar, multiecho pulse sequences of the brain and surrounding structures were obtained without intravenous contrast. COMPARISON:  [facility] brain MRI 09/07/2015. Head CT 08/24/2019. FINDINGS: Brain: Cerebral volume appears stable since 2017. No restricted diffusion to suggest acute infarction. No midline shift, mass effect, evidence of mass lesion, ventriculomegaly, extra-axial collection or acute intracranial hemorrhage. Cervicomedullary junction and pituitary are within normal limits. Widely scattered and patchy bilateral cerebral white matter T2 and FLAIR hyperintensity. Associated T2 signal heterogeneity in the bilateral deep gray nuclei and pons. No significant change since 2017. No cortical encephalomalacia or chronic cerebral blood products identified. Vascular: Major intracranial vascular flow voids are stable since 2017, with dominant right vertebral artery.  Skull and upper cervical spine: Normal for age visible cervical spine, bone marrow signal. Sinuses/Orbits: Negative. Other: Left mastoid effusion has resolved since 2017. Visible internal auditory structures appear grossly normal. Scalp and face soft tissues appear negative. IMPRESSION: 1. No acute intracranial abnormality. 2. Stable non-contrast MRI appearance of the brain since 2017  with moderate for age signal changes most compatible with chronic small vessel disease. Electronically Signed   By: Genevie Elizabeth M.D.   On: 11/06/2019 08:59   DG CHEST PORT 1 VIEW  Result Date: 11/27/2019 CLINICAL DATA:  Shortness of breath. EXAM: PORTABLE CHEST 1 VIEW COMPARISON:  Chest x-ray dated Nov 25, 2019. FINDINGS: Normal heart size. Unchanged ascending thoracic aortic aneurysm. Normal pulmonary vascularity. No focal consolidation, pleural effusion, or pneumothorax. No acute osseous abnormality. IMPRESSION: 1. No active disease. 2. Unchanged ascending thoracic aortic aneurysm. Electronically Signed   By: Titus Dubin M.D.   On: 11/27/2019 07:41   DG CHEST PORT 1 VIEW  Result Date: 11/25/2019 CLINICAL DATA:  UTI, sepsis. EXAM: PORTABLE CHEST 1 VIEW COMPARISON:  February 08, 2017 FINDINGS: The lungs are hyperinflated. There is no evidence of acute infiltrate, pleural effusion or pneumothorax. The cardiac silhouette is within normal limits. Widening of the superior mediastinum is seen on the right. This extends to involve the right hilum. Multilevel degenerative changes seen throughout the thoracic spine. IMPRESSION: 1. Widening of the superior mediastinum on the right. Correlation with contrast-enhanced chest CT is recommended, as aneurysmal dilatation of the ascending thoracic aorta cannot be excluded. 2. No acute or active cardiopulmonary disease. Electronically Signed   By: Virgina Norfolk M.D.   On: 11/25/2019 16:51   DG Hip Unilat W or Wo Pelvis 2-3 Views Left  Result Date: 11/25/2019 CLINICAL DATA:  Pain following fall EXAM: DG HIP (WITH OR WITHOUT PELVIS) 2-3V LEFT COMPARISON:  February 27, 2012 FINDINGS: Frontal pelvis as well as frontal and lateral left hip images were obtained. No acute fracture or dislocation. There is moderately severe joint space narrowing on the right with milder narrowing on the left, unchanged. No erosive changes. Bony overgrowth is noted in the pubic symphysis region,  stable. There is degenerative change in the lower lumbar spine, stable. 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 No fracture or dislocation. Electronically Signed   By: Lowella Grip III M.D.   On: 11/25/2019 11:45    Microbiology: Recent Results (from the past 240 hour(s))  Urine culture     Status: Abnormal   Collection Time: 11/25/19 10:51 AM   Specimen: Urine, Random  Result Value Ref Range Status   Specimen Description   Final    URINE, RANDOM Performed at [facility] [address], [place], [place] [address]    Special Requests   Final    NONE Performed at [facility], [place] [address] [place], [place] [address]    Culture >=100,000 COLONIES/mL ESCHERICHIA COLI (A)  Final   Report Status 11/27/2019 FINAL  Final   Organism ID, Bacteria ESCHERICHIA COLI (A)  Final      Susceptibility   Escherichia coli - MIC*    AMPICILLIN 4 SENSITIVE Sensitive     CEFAZOLIN <=4 SENSITIVE Sensitive     CEFTRIAXONE <=1 SENSITIVE Sensitive     CIPROFLOXACIN <=0.25 SENSITIVE Sensitive     GENTAMICIN <=1 SENSITIVE Sensitive     IMIPENEM <=0.25 SENSITIVE Sensitive     NITROFURANTOIN <=16 SENSITIVE Sensitive     TRIMETH/SULFA <=  20 SENSITIVE Sensitive     AMPICILLIN/SULBACTAM <=2 SENSITIVE Sensitive     PIP/TAZO <=4 SENSITIVE Sensitive     * >=100,000 COLONIES/mL ESCHERICHIA COLI  Respiratory Panel by RT PCR (Flu A&B, Covid) - Nasopharyngeal Swab     Status: None   Collection Time: 11/25/19  2:29 PM   Specimen: Nasopharyngeal Swab  Result Value Ref Range Status   SARS Coronavirus 2 by RT PCR NEGATIVE NEGATIVE Final    Comment: (NOTE) SARS-CoV-2 target nucleic acids are NOT DETECTED. The SARS-CoV-2 RNA is generally detectable in upper respiratoy specimens during the acute phase of infection. The lowest concentration of SARS-CoV-2 viral copies this assay can detect is 131 copies/mL. A negative result does  not preclude SARS-Cov-2 infection and should not be used as the sole basis for treatment or other patient management decisions. A negative result may occur with  improper specimen collection/handling, submission of specimen other than nasopharyngeal swab, presence of viral mutation(s) within the areas targeted by this assay, and inadequate number of viral copies (<131 copies/mL). A negative result must be combined with clinical observations, patient history, and epidemiological information. The expected result is Negative. Fact Sheet for Patients:  PinkCheek.be Fact Sheet for Healthcare Providers:  GravelBags.it This test is not yet ap proved or cleared by the [place] FDA and  has been authorized for detection and/or diagnosis of SARS-CoV-2 by FDA under an Emergency Use Authorization (EUA). This EUA will remain  in effect (meaning this test can be used) for the duration of the COVID-19 declaration under Section 564(b)(1) of the Act, 21 U.S.C. section 360bbb-3(b)(1), unless the authorization is terminated or revoked sooner.    Influenza A by PCR NEGATIVE NEGATIVE Final   Influenza B by PCR NEGATIVE NEGATIVE Final    Comment: (NOTE) The Xpert Xpress SARS-CoV-2/FLU/RSV assay is intended as an aid in  the diagnosis of influenza from Nasopharyngeal swab specimens and  should not be used as a sole basis for treatment. Nasal washings and  aspirates are unacceptable for Xpert Xpress SARS-CoV-2/FLU/RSV  testing. Fact Sheet for Patients: PinkCheek.be Fact Sheet for Healthcare Providers: GravelBags.it This test is not yet approved or cleared by the [place] FDA and  has been authorized for detection and/or diagnosis of SARS-CoV-2 by  FDA under an Emergency Use Authorization (EUA). This EUA will remain  in effect (meaning this test can be used) for the duration of the   Covid-19 declaration under Section 564(b)(1) of the Act, 21  U.S.C. section 360bbb-3(b)(1), unless the authorization is  terminated or revoked. Performed at [facility], [place] [address], [place], [place] [address]   Blood Culture (routine x 2)     Status: None (Preliminary result)   Collection Time: 11/25/19  3:16 PM   Specimen: BLOOD  Result Value Ref Range Status   Specimen Description   Final    BLOOD BLOOD RIGHT FOREARM Performed at [facility] [address], [place], [place] [address]    Special Requests   Final    BOTTLES DRAWN AEROBIC AND ANAEROBIC Blood Culture results may not be optimal due to an excessive volume of blood received in culture bottles Performed at [facility] [address] [place], [place] [address]    Culture   Final    NO GROWTH 3 DAYS Performed at [facility], [place] [address], [place], [place] [address]    Report Status PENDING  Incomplete  Blood Culture (routine x 2)  Status: None (Preliminary result)   Collection Time: 11/25/19  3:16 PM   Specimen: BLOOD  Result Value Ref Range Status   Specimen Description   Final    BLOOD RIGHT ANTECUBITAL Performed at [facility] [address] [place], [place] [address]    Special Requests   Final    BOTTLES DRAWN AEROBIC AND ANAEROBIC Blood Culture adequate volume Performed at [facility] [address] [place], [place] [address]    Culture   Final    NO GROWTH 3 DAYS Performed at [facility], [place] [address] [place], [place] [address]    Report Status PENDING  Incomplete  MRSA PCR Screening     Status: None   Collection Time: 11/25/19  4:18 PM   Specimen: Nasal Mucosa; Nasopharyngeal  Result Value Ref Range Status   MRSA by PCR NEGATIVE NEGATIVE Final    Comment:        The GeneXpert MRSA Assay (FDA approved for NASAL specimens only), is one component of a comprehensive MRSA  colonization surveillance program. It is not intended to diagnose MRSA infection nor to guide or monitor treatment for MRSA infections. Performed at [facility], [place] [address] [place], [place] [address]   SARS Coronavirus 2 by RT PCR (hospital order, performed in [facility] hospital lab) Nasopharyngeal Nasopharyngeal Swab     Status: None   Collection Time: 11/27/19  3:06 PM   Specimen: Nasopharyngeal Swab  Result Value Ref Range Status   SARS Coronavirus 2 NEGATIVE NEGATIVE Final    Comment: (NOTE) SARS-CoV-2 target nucleic acids are NOT DETECTED. The SARS-CoV-2 RNA is generally detectable in upper and lower respiratory specimens during the acute phase of infection. The lowest concentration of SARS-CoV-2 viral copies this assay can detect is 250 copies / mL. A negative result does not preclude SARS-CoV-2 infection and should not be used as the sole basis for treatment or other patient management decisions.  A negative result may occur with improper specimen collection / handling, submission of specimen other than nasopharyngeal swab, presence of viral mutation(s) within the areas targeted by this assay, and inadequate number of viral copies (<250 copies / mL). A negative result must be combined with clinical observations, patient history, and epidemiological information. Fact Sheet for Patients:   StrictlyIdeas.no Fact Sheet for Healthcare Providers: BankingDealers.co.za This test is not yet approved or cleared  by the [place] FDA and has been authorized for detection and/or diagnosis of SARS-CoV-2 by FDA under an Emergency Use Authorization (EUA).  This EUA will remain in effect (meaning this test can be used) for the duration of the COVID-19 declaration under Section 564(b)(1) of the Act, 21 U.S.C. section 360bbb-3(b)(1), unless the authorization is terminated or revoked sooner. Performed at [facility], [place] [address] [place], [place] [address]      Labs: Basic Metabolic Panel: Recent Labs  Lab 11/25/19 1051 11/26/19 0242 11/27/19 0157 11/28/19 0550  NA 138 139 138 141  K 3.5 2.9* 3.7 3.9  CL 101 107 106 108  CO2 27 24 23 24   GLUCOSE 116* 110* 103* 107*  BUN 24* 20 19 21   CREATININE 0.91 0.71 0.69 0.75  CALCIUM 8.9 7.9* 8.2* 8.9  MG  --  2.0 1.9 2.0  PHOS  --  2.5 2.0* 3.6   Liver Function Tests: Recent Labs  Lab 11/25/19 1617 11/26/19 0242 11/27/19 0157 11/28/19 0550  AST 21 20 24 29   ALT 14 <  5 6 9   ALKPHOS 67 51 58 51  BILITOT 1.4* 1.4* 0.8 0.5  PROT 7.3 5.4* 6.0* 6.5  ALBUMIN 4.3 3.1* 3.3* 3.4*   No results for input(s): LIPASE, AMYLASE in the last 168 hours. Recent Labs  Lab 11/28/19 0550  AMMONIA 31   CBC: Recent Labs  Lab 11/25/19 1051 11/26/19 0242 11/27/19 0157 11/28/19 0550  WBC 14.3* 12.2* 8.4 5.8  NEUTROABS 11.9*  --  6.6 3.7  HGB 11.9* 9.3* 9.8* 10.1*  HCT 36.8 29.6* 30.5* 31.3*  MCV 99.5 100.7* 98.7 99.4  PLT 262 220 234 259   Cardiac Enzymes: No results for input(s): CKTOTAL, CKMB, CKMBINDEX, TROPONINI in the last 168 hours. BNP: BNP (last 3 results) No results for input(s): BNP in the last 8760 hours.  ProBNP (last 3 results) No results for input(s): PROBNP in the last 8760 hours.  CBG: Recent Labs  Lab 11/26/19 0814  GLUCAP 86       Signed:  Oswald Hillock MD.  [facility] 11/28/2019, 11:35 AM

## 2019-11-28 NOTE — Plan of Care (Signed)
  Problem: Nutrition: Goal: Adequate nutrition will be maintained Outcome: Progressing   Problem: Elimination: Goal: Will not experience complications related to bowel motility Outcome: Progressing   Problem: Pain Managment: Goal: General experience of comfort will improve Outcome: Progressing   

## 2019-11-29 DIAGNOSIS — M25561 Pain in right knee: Secondary | ICD-10-CM | POA: Diagnosis not present

## 2019-11-29 DIAGNOSIS — G20 Parkinson's disease: Secondary | ICD-10-CM | POA: Diagnosis not present

## 2019-11-29 DIAGNOSIS — M6281 Muscle weakness (generalized): Secondary | ICD-10-CM | POA: Diagnosis not present

## 2019-11-29 DIAGNOSIS — M25562 Pain in left knee: Secondary | ICD-10-CM | POA: Diagnosis not present

## 2019-11-29 DIAGNOSIS — R2681 Unsteadiness on feet: Secondary | ICD-10-CM | POA: Diagnosis not present

## 2019-11-29 DIAGNOSIS — R296 Repeated falls: Secondary | ICD-10-CM | POA: Diagnosis not present

## 2019-11-30 DIAGNOSIS — R296 Repeated falls: Secondary | ICD-10-CM | POA: Diagnosis not present

## 2019-11-30 DIAGNOSIS — G20 Parkinson's disease: Secondary | ICD-10-CM | POA: Diagnosis not present

## 2019-11-30 DIAGNOSIS — M6281 Muscle weakness (generalized): Secondary | ICD-10-CM | POA: Diagnosis not present

## 2019-11-30 LAB — CULTURE, BLOOD (ROUTINE X 2)
Culture: NO GROWTH
Culture: NO GROWTH
Special Requests: ADEQUATE

## 2019-12-03 DIAGNOSIS — M6281 Muscle weakness (generalized): Secondary | ICD-10-CM | POA: Diagnosis not present

## 2019-12-03 DIAGNOSIS — R2681 Unsteadiness on feet: Secondary | ICD-10-CM | POA: Diagnosis not present

## 2019-12-03 DIAGNOSIS — M25561 Pain in right knee: Secondary | ICD-10-CM | POA: Diagnosis not present

## 2019-12-03 DIAGNOSIS — G20 Parkinson's disease: Secondary | ICD-10-CM | POA: Diagnosis not present

## 2019-12-03 DIAGNOSIS — M25562 Pain in left knee: Secondary | ICD-10-CM | POA: Diagnosis not present

## 2019-12-03 DIAGNOSIS — R296 Repeated falls: Secondary | ICD-10-CM | POA: Diagnosis not present

## 2019-12-03 DIAGNOSIS — R41841 Cognitive communication deficit: Secondary | ICD-10-CM | POA: Diagnosis not present

## 2019-12-03 DIAGNOSIS — R498 Other voice and resonance disorders: Secondary | ICD-10-CM | POA: Diagnosis not present

## 2019-12-04 DIAGNOSIS — G20 Parkinson's disease: Secondary | ICD-10-CM | POA: Diagnosis not present

## 2019-12-04 DIAGNOSIS — R2681 Unsteadiness on feet: Secondary | ICD-10-CM | POA: Diagnosis not present

## 2019-12-04 DIAGNOSIS — M6281 Muscle weakness (generalized): Secondary | ICD-10-CM | POA: Diagnosis not present

## 2019-12-04 DIAGNOSIS — M25562 Pain in left knee: Secondary | ICD-10-CM | POA: Diagnosis not present

## 2019-12-04 DIAGNOSIS — R296 Repeated falls: Secondary | ICD-10-CM | POA: Diagnosis not present

## 2019-12-04 DIAGNOSIS — M25561 Pain in right knee: Secondary | ICD-10-CM | POA: Diagnosis not present

## 2019-12-05 DIAGNOSIS — M6281 Muscle weakness (generalized): Secondary | ICD-10-CM | POA: Diagnosis not present

## 2019-12-05 DIAGNOSIS — G20 Parkinson's disease: Secondary | ICD-10-CM | POA: Diagnosis not present

## 2019-12-05 DIAGNOSIS — R296 Repeated falls: Secondary | ICD-10-CM | POA: Diagnosis not present

## 2019-12-05 DIAGNOSIS — R2681 Unsteadiness on feet: Secondary | ICD-10-CM | POA: Diagnosis not present

## 2019-12-05 DIAGNOSIS — M25562 Pain in left knee: Secondary | ICD-10-CM | POA: Diagnosis not present

## 2019-12-05 DIAGNOSIS — M25561 Pain in right knee: Secondary | ICD-10-CM | POA: Diagnosis not present

## 2019-12-06 DIAGNOSIS — R41841 Cognitive communication deficit: Secondary | ICD-10-CM | POA: Diagnosis not present

## 2019-12-06 DIAGNOSIS — M25561 Pain in right knee: Secondary | ICD-10-CM | POA: Diagnosis not present

## 2019-12-06 DIAGNOSIS — G20 Parkinson's disease: Secondary | ICD-10-CM | POA: Diagnosis not present

## 2019-12-06 DIAGNOSIS — M25562 Pain in left knee: Secondary | ICD-10-CM | POA: Diagnosis not present

## 2019-12-06 DIAGNOSIS — R498 Other voice and resonance disorders: Secondary | ICD-10-CM | POA: Diagnosis not present

## 2019-12-06 DIAGNOSIS — R2681 Unsteadiness on feet: Secondary | ICD-10-CM | POA: Diagnosis not present

## 2019-12-06 DIAGNOSIS — R296 Repeated falls: Secondary | ICD-10-CM | POA: Diagnosis not present

## 2019-12-06 DIAGNOSIS — M6281 Muscle weakness (generalized): Secondary | ICD-10-CM | POA: Diagnosis not present

## 2019-12-07 DIAGNOSIS — M25561 Pain in right knee: Secondary | ICD-10-CM | POA: Diagnosis not present

## 2019-12-07 DIAGNOSIS — G20 Parkinson's disease: Secondary | ICD-10-CM | POA: Diagnosis not present

## 2019-12-07 DIAGNOSIS — M6281 Muscle weakness (generalized): Secondary | ICD-10-CM | POA: Diagnosis not present

## 2019-12-07 DIAGNOSIS — R296 Repeated falls: Secondary | ICD-10-CM | POA: Diagnosis not present

## 2019-12-07 DIAGNOSIS — M25562 Pain in left knee: Secondary | ICD-10-CM | POA: Diagnosis not present

## 2019-12-07 DIAGNOSIS — R2681 Unsteadiness on feet: Secondary | ICD-10-CM | POA: Diagnosis not present

## 2019-12-10 DIAGNOSIS — G20 Parkinson's disease: Secondary | ICD-10-CM | POA: Diagnosis not present

## 2019-12-10 DIAGNOSIS — R296 Repeated falls: Secondary | ICD-10-CM | POA: Diagnosis not present

## 2019-12-10 DIAGNOSIS — R2681 Unsteadiness on feet: Secondary | ICD-10-CM | POA: Diagnosis not present

## 2019-12-10 DIAGNOSIS — M6281 Muscle weakness (generalized): Secondary | ICD-10-CM | POA: Diagnosis not present

## 2019-12-10 DIAGNOSIS — M25562 Pain in left knee: Secondary | ICD-10-CM | POA: Diagnosis not present

## 2019-12-10 DIAGNOSIS — M25561 Pain in right knee: Secondary | ICD-10-CM | POA: Diagnosis not present

## 2019-12-11 DIAGNOSIS — M25561 Pain in right knee: Secondary | ICD-10-CM | POA: Diagnosis not present

## 2019-12-11 DIAGNOSIS — M25562 Pain in left knee: Secondary | ICD-10-CM | POA: Diagnosis not present

## 2019-12-11 DIAGNOSIS — R296 Repeated falls: Secondary | ICD-10-CM | POA: Diagnosis not present

## 2019-12-11 DIAGNOSIS — G20 Parkinson's disease: Secondary | ICD-10-CM | POA: Diagnosis not present

## 2019-12-11 DIAGNOSIS — M6281 Muscle weakness (generalized): Secondary | ICD-10-CM | POA: Diagnosis not present

## 2019-12-11 DIAGNOSIS — R2681 Unsteadiness on feet: Secondary | ICD-10-CM | POA: Diagnosis not present

## 2019-12-12 DIAGNOSIS — M25561 Pain in right knee: Secondary | ICD-10-CM | POA: Diagnosis not present

## 2019-12-12 DIAGNOSIS — G20 Parkinson's disease: Secondary | ICD-10-CM | POA: Diagnosis not present

## 2019-12-12 DIAGNOSIS — R296 Repeated falls: Secondary | ICD-10-CM | POA: Diagnosis not present

## 2019-12-12 DIAGNOSIS — M25562 Pain in left knee: Secondary | ICD-10-CM | POA: Diagnosis not present

## 2019-12-12 DIAGNOSIS — M6281 Muscle weakness (generalized): Secondary | ICD-10-CM | POA: Diagnosis not present

## 2019-12-12 DIAGNOSIS — R2681 Unsteadiness on feet: Secondary | ICD-10-CM | POA: Diagnosis not present

## 2019-12-13 DIAGNOSIS — R2681 Unsteadiness on feet: Secondary | ICD-10-CM | POA: Diagnosis not present

## 2019-12-13 DIAGNOSIS — M6281 Muscle weakness (generalized): Secondary | ICD-10-CM | POA: Diagnosis not present

## 2019-12-13 DIAGNOSIS — M25561 Pain in right knee: Secondary | ICD-10-CM | POA: Diagnosis not present

## 2019-12-13 DIAGNOSIS — G20 Parkinson's disease: Secondary | ICD-10-CM | POA: Diagnosis not present

## 2019-12-13 DIAGNOSIS — M25562 Pain in left knee: Secondary | ICD-10-CM | POA: Diagnosis not present

## 2019-12-13 DIAGNOSIS — R296 Repeated falls: Secondary | ICD-10-CM | POA: Diagnosis not present

## 2019-12-13 DIAGNOSIS — R498 Other voice and resonance disorders: Secondary | ICD-10-CM | POA: Diagnosis not present

## 2019-12-13 DIAGNOSIS — R41841 Cognitive communication deficit: Secondary | ICD-10-CM | POA: Diagnosis not present

## 2019-12-14 DIAGNOSIS — M6281 Muscle weakness (generalized): Secondary | ICD-10-CM | POA: Diagnosis not present

## 2019-12-14 DIAGNOSIS — R41841 Cognitive communication deficit: Secondary | ICD-10-CM | POA: Diagnosis not present

## 2019-12-14 DIAGNOSIS — M25561 Pain in right knee: Secondary | ICD-10-CM | POA: Diagnosis not present

## 2019-12-14 DIAGNOSIS — R2681 Unsteadiness on feet: Secondary | ICD-10-CM | POA: Diagnosis not present

## 2019-12-14 DIAGNOSIS — R498 Other voice and resonance disorders: Secondary | ICD-10-CM | POA: Diagnosis not present

## 2019-12-14 DIAGNOSIS — M25562 Pain in left knee: Secondary | ICD-10-CM | POA: Diagnosis not present

## 2019-12-14 NOTE — Progress Notes (Signed)
Subjective:   Elizabeth Mccullough is a 79 y.o. female who presents for Medicare Annual (Subsequent) preventive examination.  Pt is accompanied by her dtr.  Review of Systems:  Home Safety/Smoke Alarms: Feels safe in home. Smoke alarms in place.  Lives in independent living at [facility].  All meals provided. Emergency pull cords in apt.   Female:   Mammo-  Declines     Dexa scan- declines            Objective:     Vitals: BP 100/68 (BP Location: Right Arm, Patient Position: Sitting, Cuff Size: Normal) Comment: all vitals done by Shea Evans CMA  Pulse 76   Temp 97.8 F (36.6 C) (Temporal)   Ht 5\' 2"  (1.575 m)   Wt 128 lb 4.9 oz (58.2 kg)   LMP 07/19/1988   SpO2 98%   BMI 23.47 kg/m   Body mass index is 23.47 kg/m.  Advanced Directives 12/18/2019 11/25/2019 08/24/2019 02/20/2019 01/25/2018 03/03/2017 11/26/2016  Does Patient Have a Medical Advance Directive? Yes No No Yes Yes Yes Yes  Type of Paramedic of [place];Living will - - Living will;Healthcare Power of [place];Living will [place];Living will [place];Living will  Does patient want to make changes to medical advance directive? No - Patient declined - - - - No - Patient declined -  Copy of [place] in Chart? No - copy requested - - - No - copy requested No - copy requested -  Would patient like information on creating a medical advance directive? - No - Patient declined No - Patient declined - - - -    Tobacco Social History   Tobacco Use  Smoking Status Never Smoker  Smokeless Tobacco Never Used     Counseling given: Not Answered   Clinical Intake:     Pain : No/denies pain Pain Score: 0-No pain                 Past Medical History:  Diagnosis Date  . AAA (abdominal aortic aneurysm) ([place])   . Carotid artery disease ([place]) 01/12/2017   Carotid US 6/18: Bilat < 50%; repeat in  12/2000  . Chronic combined systolic and diastolic CHF (congestive heart failure) ([place]) 03/01/2017   NICM // [place] 8/18: normal coronary arteries /  Echo 8/18: EF 20-25, mod AI, mod MR, PASP 32 // Echo 1/19: EF 55-60, no RWMA, Gr 1 DD, mod AI, mild MR, PASP 32  . Hyperlipidemia   . Parkinson's disease [facility])    Past Surgical History:  Procedure Laterality Date  . BREAST CYST ASPIRATION  1965   Right Breast  . HAMMER TOE SURGERY  04/10/02   Left Toe  . KNEE SURGERY Right 10/17/15   meniscus repair  . NASAL SEPTUM SURGERY  1980  . RIGHT/LEFT HEART CATH AND CORONARY ANGIOGRAPHY N/A 03/03/2017   Procedure: RIGHT/LEFT HEART CATH AND CORONARY ANGIOGRAPHY;  Surgeon: Nelva Bush, MD;  Location: [place] [facility];  Service: Cardiovascular;  Laterality: N/A;  . THORACIC AORTOGRAM N/A 03/03/2017   Procedure: Thoracic Aortogram;  Surgeon: Nelva Bush, MD;  Location: [place] [facility];  Service: Cardiovascular;  Laterality: N/A;   Family History  Problem Relation Age of Onset  . Heart disease Mother   . Hyperlipidemia Sister   . Hyperlipidemia Brother   . Diabetes Sister   . Hyperlipidemia Sister   . Cancer Sister 1  colon  . Cancer Sister        breast  . Stroke Brother   . Hypertension Brother   . Breast cancer Other   . Colon cancer Other   . Prostate cancer Son   . Diabetes Daughter   . Stomach cancer Daughter   . Healthy Daughter    Social History   Socioeconomic History  . Marital status: Widowed    Spouse name: Not on file  . Number of children: 4  . Years of education: Not on file  . Highest education level: 12th grade  Occupational History  . Occupation: retired  Tobacco Use  . Smoking status: Never Smoker  . Smokeless tobacco: Never Used  Substance and Sexual Activity  . Alcohol use: Yes    Comment: social wine   . Drug use: No  . Sexual activity: Not Currently    Partners: Male  Other Topics Concern  . Not on file  Social History Narrative    Exercise-- no   Pt lives with daughter [name] at home   Social Determinants of Health   Financial Resource Strain: Low Risk   . Difficulty of Paying Living Expenses: Not hard at all  Food Insecurity: No Food Insecurity  . Worried About Charity fundraiser in the Last Year: Never true  . Ran Out of Food in the Last Year: Never true  Transportation Needs: No Transportation Needs  . Lack of Transportation (Medical): No  . Lack of Transportation (Non-Medical): No  Physical Activity:   . Days of Exercise per Week:   . Minutes of Exercise per Session:   Stress:   . Feeling of Stress :   Social Connections:   . Frequency of Communication with Friends and Family:   . Frequency of Social Gatherings with Friends and Family:   . Attends Religious Services:   . Active Member of Clubs or Organizations:   . Attends Archivist Meetings:   Marland Kitchen Marital Status:     Outpatient Encounter Medications as of 12/18/2019  Medication Sig  . Acetaminophen 325 MG CAPS Take 1-2 capsules by mouth every 8 (eight) hours as needed (moderate to severe pain).  Marland Kitchen aspirin EC 81 MG tablet Take 1 tablet (81 mg total) by mouth daily.  . bisoprolol (ZEBETA) 5 MG tablet Take 0.5 tablets (2.5 mg total) by mouth daily.  . Carbidopa-Levodopa ER (SINEMET CR) 25-100 MG tablet controlled release 2 pills 3 times per day (Patient taking differently: Take 1 tablet by mouth in the morning, at noon, and at bedtime. )  . furosemide (LASIX) 20 MG tablet TAKE 1 TABLET BY MOUTH DAILY. (Patient taking differently: Take 20 mg by mouth daily. )  . losartan (COZAAR) 25 MG tablet Take 1 tablet (25 mg total) by mouth daily. Please make yearly appt with Dr. Acie Fredrickson for July for future refills. 1st attempt  . lovastatin (MEVACOR) 20 MG tablet Take 1 tablet (20 mg total) by mouth at bedtime.  . potassium chloride (KLOR-CON) 10 MEQ tablet TAKE 1 TABLET BY MOUTH DAILY. (Patient taking differently: Take 10 mEq by mouth daily. )  .  [DISCONTINUED] venlafaxine XR (EFFEXOR-XR) 150 MG 24 hr capsule Take 150 mg by mouth every morning.   No facility-administered encounter medications on file as of 12/18/2019.    Activities of Daily Living In your present state of health, do you have any difficulty performing the following activities: 12/18/2019 11/25/2019  Hearing? N N  Vision? N N  Difficulty concentrating or  making decisions? Tempie Donning  Walking or climbing stairs? Y Y  Dressing or bathing? Y Y  Doing errands, shopping? Tempie Donning  Preparing Food and eating ? Y -  Using the Toilet? Y -  In the past six months, have you accidently leaked urine? Y -  Do you have problems with loss of bowel control? N -  Managing your Medications? Y -  Managing your Finances? Y -  Housekeeping or managing your Housekeeping? Y -  Some recent data might be hidden    Patient Care Team: Carollee Herter, Alferd Apa, DO as PCP - General (Family Medicine) Nahser, Wonda Cheng, MD as PCP - Cardiology (Cardiology) Prescott Gum, Collier Salina, MD as Consulting Physician (Cardiothoracic Surgery) Shon Hough, MD as Consulting Physician (Ophthalmology) Paralee Cancel, MD as Consulting Physician (Orthopedic Surgery)    Assessment:   This is a routine wellness examination for [place]. Physical assessment deferred to PCP.  Exercise Activities and Dietary recommendations Current Exercise Habits: Structured exercise class, Time (Minutes): 30, Frequency (Times/Week): 6, Weekly Exercise (Minutes/Week): 180, Intensity: Mild, Exercise limited by: neurologic condition(s) Diet (meal preparation, eat out, water intake, caffeinated beverages, dairy products, fruits and vegetables): well balanced     Goals    . LIFESTYLE - DECREASE FALLS RISK       Fall Risk Fall Risk  12/18/2019 09/17/2019 08/09/2019 02/20/2019 12/28/2018  Falls in the past year? 1 1 1 1  0  Number falls in past yr: 1 1 1  0 -  Injury with Fall? 1 1 1  0 -  Comment - - - - -  Risk Factor Category  - - - - -  Risk for fall  due to : - History of fall(s) - - -  Risk for fall due to: Comment - - - - -  Follow up Education provided;Falls prevention discussed - Falls evaluation completed - Falls evaluation completed   Depression Screen PHQ 2/9 Scores 12/18/2019 08/09/2019 01/25/2019 11/09/2018  PHQ - 2 Score 1 4 6  0  PHQ- 9 Score - 15 16 2      Cognitive Function     MMSE - Mini Mental State Exam 02/20/2019  Orientation to time 5  Orientation to Place 5  Registration 3  Attention/ Calculation 5  Recall 3  Language- name 2 objects 2  Language- repeat 1  Language- follow 3 step command 3  Language- read & follow direction 1  Write a sentence 1  Copy design 1  Total score 30     6CIT Screen 12/18/2019 11/09/2018  What Year? 0 points 0 points  What month? 0 points 0 points  What time? 0 points 0 points  Count back from 20 0 points 0 points  Months in reverse 0 points 0 points  Repeat phrase 10 points 10 points  Total Score 10 10    Immunization History  Administered Date(s) Administered  . Influenza Split 05/11/2011, 05/10/2012  . Influenza Whole 05/16/2007, 04/16/2008, 05/19/2009, 04/29/2010  . Influenza, High Dose Seasonal PF 05/24/2013, 05/15/2015, 05/13/2016, 04/20/2018  . Influenza,inj,Quad PF,6+ Mos 04/29/2014  . Influenza-Unspecified 05/16/2017  . PPD Test 08/13/2019  . Pneumococcal Conjugate-13 01/16/2014  . Pneumococcal Polysaccharide-23 06/03/2004  . Td 07/05/2006  . Tdap 04/10/2013, 04/28/2018    Screening Tests Health Maintenance  Topic Date Due  . COVID-19 Vaccine (1) Never done  . MAMMOGRAM  01/05/2018  . INFLUENZA VACCINE  02/17/2020  . TETANUS/TDAP  04/28/2028  . DEXA SCAN  Completed  . PNA vac Low Risk Adult  Completed  Plan:    Please schedule your next medicare wellness visit with me in 1 yr.  Continue to eat heart healthy diet (full of fruits, vegetables, whole grains, lean protein, water--limit salt, fat, and sugar intake) and increase physical activity as  tolerated.  Continue doing brain stimulating activities (puzzles, reading, adult coloring books, staying active) to keep memory sharp.   Bring a copy of your living will and/or healthcare power of attorney to your next office visit.   I have personally reviewed and noted the following in the patient's chart:   . Medical and social history . Use of alcohol, tobacco or illicit drugs  . Current medications and supplements . Functional ability and status . Nutritional status . Physical activity . Advanced directives . List of other physicians . Hospitalizations, surgeries, and ER visits in previous 12 months . Vitals . Screenings to include cognitive, depression, and falls . Referrals and appointments  In addition, I have reviewed and discussed with patient certain preventive protocols, quality metrics, and best practice recommendations. A written personalized care plan for preventive services as well as general preventive health recommendations were provided to patient.     Shela Nevin, [place]  12/18/2019

## 2019-12-18 ENCOUNTER — Other Ambulatory Visit: Payer: Self-pay

## 2019-12-18 ENCOUNTER — Encounter: Payer: Self-pay | Admitting: *Deleted

## 2019-12-18 ENCOUNTER — Ambulatory Visit (INDEPENDENT_AMBULATORY_CARE_PROVIDER_SITE_OTHER): Payer: Medicare Other | Admitting: Family Medicine

## 2019-12-18 ENCOUNTER — Other Ambulatory Visit: Payer: Self-pay | Admitting: Cardiovascular Disease

## 2019-12-18 ENCOUNTER — Ambulatory Visit (INDEPENDENT_AMBULATORY_CARE_PROVIDER_SITE_OTHER): Payer: Medicare Other | Admitting: *Deleted

## 2019-12-18 ENCOUNTER — Encounter: Payer: Self-pay | Admitting: Family Medicine

## 2019-12-18 VITALS — BP 100/68 | HR 76 | Temp 97.8°F | Ht 62.0 in | Wt 128.3 lb

## 2019-12-18 VITALS — BP 100/68 | HR 76 | Temp 97.8°F | Resp 18 | Ht 62.0 in | Wt 128.2 lb

## 2019-12-18 DIAGNOSIS — G20 Parkinson's disease: Secondary | ICD-10-CM | POA: Diagnosis not present

## 2019-12-18 DIAGNOSIS — F419 Anxiety disorder, unspecified: Secondary | ICD-10-CM

## 2019-12-18 DIAGNOSIS — R296 Repeated falls: Secondary | ICD-10-CM | POA: Diagnosis not present

## 2019-12-18 DIAGNOSIS — F32A Depression, unspecified: Secondary | ICD-10-CM

## 2019-12-18 DIAGNOSIS — N390 Urinary tract infection, site not specified: Secondary | ICD-10-CM | POA: Diagnosis not present

## 2019-12-18 DIAGNOSIS — M25561 Pain in right knee: Secondary | ICD-10-CM | POA: Diagnosis not present

## 2019-12-18 DIAGNOSIS — F329 Major depressive disorder, single episode, unspecified: Secondary | ICD-10-CM

## 2019-12-18 DIAGNOSIS — E785 Hyperlipidemia, unspecified: Secondary | ICD-10-CM | POA: Diagnosis not present

## 2019-12-18 DIAGNOSIS — R2681 Unsteadiness on feet: Secondary | ICD-10-CM | POA: Diagnosis not present

## 2019-12-18 DIAGNOSIS — M6281 Muscle weakness (generalized): Secondary | ICD-10-CM | POA: Diagnosis not present

## 2019-12-18 DIAGNOSIS — M25562 Pain in left knee: Secondary | ICD-10-CM | POA: Diagnosis not present

## 2019-12-18 DIAGNOSIS — R413 Other amnesia: Secondary | ICD-10-CM

## 2019-12-18 DIAGNOSIS — G20A1 Parkinson's disease without dyskinesia, without mention of fluctuations: Secondary | ICD-10-CM

## 2019-12-18 DIAGNOSIS — Z0000 Encounter for general adult medical examination without abnormal findings: Secondary | ICD-10-CM | POA: Diagnosis not present

## 2019-12-18 LAB — POC URINALSYSI DIPSTICK (AUTOMATED)
Bilirubin, UA: NEGATIVE
Glucose, UA: NEGATIVE
Ketones, UA: NEGATIVE
Nitrite, UA: NEGATIVE
Protein, UA: POSITIVE — AB
Spec Grav, UA: 1.025 (ref 1.010–1.025)
Urobilinogen, UA: 1 E.U./dL
pH, UA: 6 (ref 5.0–8.0)

## 2019-12-18 NOTE — Assessment & Plan Note (Signed)
Per neuro 

## 2019-12-18 NOTE — Progress Notes (Signed)
Patient ID: Elizabeth Mccullough, female    DOB: 1940-09-03  Age: 79 y.o. MRN: [id number]    Subjective:  Subjective    HPI Elizabeth Mccullough presents for f/u bp , chol .    Her daughter states she is getting anxious at home but pt stopped effexor and does not want any more meds    Review of Systems  Constitutional: Negative for appetite change, diaphoresis, fatigue and unexpected weight change.  Eyes: Negative for pain, redness and visual disturbance.  Respiratory: Negative for cough, chest tightness, shortness of breath and wheezing.   Cardiovascular: Negative for chest pain, palpitations and leg swelling.  Endocrine: Negative for cold intolerance, heat intolerance, polydipsia, polyphagia and polyuria.  Genitourinary: Negative for difficulty urinating, dysuria and frequency.  Neurological: Negative for dizziness, light-headedness, numbness and headaches.    History Past Medical History:  Diagnosis Date  . AAA (abdominal aortic aneurysm) ([place])   . Carotid artery disease ([place]) 01/12/2017   Carotid US 6/18: Bilat < 50%; repeat in 12/2000  . Chronic combined systolic and diastolic CHF (congestive heart failure) ([place]) 03/01/2017   NICM // [place] 8/18: normal coronary arteries /  Echo 8/18: EF 20-25, mod AI, mod MR, PASP 32 // Echo 1/19: EF 55-60, no RWMA, Gr 1 DD, mod AI, mild MR, PASP 32  . Hyperlipidemia   . Parkinson's disease [facility])     She has a past surgical history that includes Hammer toe surgery (04/10/02); Breast cyst aspiration (1965); Nasal septum surgery (1980); Knee surgery (Right, 10/17/15); RIGHT/LEFT HEART CATH AND CORONARY ANGIOGRAPHY (N/A, 03/03/2017); and THORACIC AORTOGRAM (N/A, 03/03/2017).   Her family history includes Breast cancer in an other family member; Cancer in her sister; Cancer (age of onset: 19) in her sister; Colon cancer in an other family member; Diabetes in her daughter and sister; Healthy in her daughter; Heart disease in her mother; Hyperlipidemia in her  brother, sister, and sister; Hypertension in her brother; Prostate cancer in her son; Stomach cancer in her daughter; Stroke in her brother.She reports that she has never smoked. She has never used smokeless tobacco. She reports current alcohol use. She reports that she does not use drugs.  Current Outpatient Medications on File Prior to Visit  Medication Sig Dispense Refill  . Acetaminophen 325 MG CAPS Take 1-2 capsules by mouth every 8 (eight) hours as needed (moderate to severe pain). 30 capsule 0  . aspirin EC 81 MG tablet Take 1 tablet (81 mg total) by mouth daily.    . bisoprolol (ZEBETA) 5 MG tablet Take 0.5 tablets (2.5 mg total) by mouth daily. 45 tablet 3  . Carbidopa-Levodopa ER (SINEMET CR) 25-100 MG tablet controlled release 2 pills 3 times per day (Patient taking differently: Take 1 tablet by mouth in the morning, at noon, and at bedtime. ) 450 tablet 1  . furosemide (LASIX) 20 MG tablet TAKE 1 TABLET BY MOUTH DAILY. (Patient taking differently: Take 20 mg by mouth daily. ) 90 tablet 3  . losartan (COZAAR) 25 MG tablet Take 1 tablet (25 mg total) by mouth daily. Please make yearly appt with Dr. Acie Fredrickson for July for future refills. 1st attempt 90 tablet 0  . lovastatin (MEVACOR) 20 MG tablet Take 1 tablet (20 mg total) by mouth at bedtime. 90 tablet 0  . potassium chloride (KLOR-CON) 10 MEQ tablet TAKE 1 TABLET BY MOUTH DAILY. (Patient taking differently: Take 10 mEq by mouth daily. ) 90 tablet 1   No current facility-administered medications on file  prior to visit.     Objective:  Objective  Physical Exam Vitals and nursing note reviewed.  Constitutional:      Appearance: She is well-developed.  HENT:     Head: Normocephalic and atraumatic.  Eyes:     Conjunctiva/sclera: Conjunctivae normal.  Neck:     Thyroid: No thyromegaly.     Vascular: No carotid bruit or JVD.  Cardiovascular:     Rate and Rhythm: Normal rate and regular rhythm.     Heart sounds: Normal heart sounds.  No murmur.  Pulmonary:     Effort: Pulmonary effort is normal. No respiratory distress.     Breath sounds: Normal breath sounds. No wheezing or rales.  Chest:     Chest wall: No tenderness.  Musculoskeletal:     Cervical back: Normal range of motion and neck supple.  Neurological:     Mental Status: She is alert and oriented to person, place, and time.    BP 100/68 (BP Location: Right Arm, Patient Position: Sitting, Cuff Size: Normal)   Pulse 76   Temp 97.8 F (36.6 C) (Temporal)   Resp 18   Ht 5\' 2"  (1.575 m)   Wt 128 lb 3.2 oz (58.2 kg)   LMP 07/19/1988   SpO2 98%   BMI 23.45 kg/m  Wt Readings from Last 3 Encounters:  12/18/19 128 lb 4.9 oz (58.2 kg)  12/18/19 128 lb 3.2 oz (58.2 kg)  11/25/19 130 lb 15.3 oz (59.4 kg)     Lab Results  Component Value Date   WBC 5.8 11/28/2019   HGB 10.1 (L) 11/28/2019   HCT 31.3 (L) 11/28/2019   PLT 259 11/28/2019   GLUCOSE 107 (H) 11/28/2019   CHOL 166 09/17/2019   TRIG 95.0 09/17/2019   HDL 57.60 09/17/2019   LDLCALC 89 09/17/2019   ALT 9 11/28/2019   AST 29 11/28/2019   NA 141 11/28/2019   K 3.9 11/28/2019   CL 108 11/28/2019   CREATININE 0.75 11/28/2019   BUN 21 11/28/2019   CO2 24 11/28/2019   TSH 2.878 11/28/2019   INR 1.1 11/25/2019   HGBA1C 5.4 01/31/2019   MICROALBUR 0.2 01/16/2014    CT Head Wo Contrast  Result Date: 11/25/2019 CLINICAL DATA:  Head trauma, minor. Additional provided: Fall from standing position, hit right-side of head on tray table. EXAM: CT HEAD WITHOUT CONTRAST TECHNIQUE: Contiguous axial images were obtained from the base of the skull through the vertex without intravenous contrast. COMPARISON:  Brain MRI 11/05/2019, head CT 08/24/2019. FINDINGS: Brain: Ill-defined hypoattenuation within the cerebral white matter is nonspecific, but consistent with chronic small vessel ischemic disease. Stable, moderate generalized parenchymal atrophy. There is no acute intracranial hemorrhage. No demarcated  cortical infarct. No extra-axial fluid collection. No evidence of intracranial mass. No midline shift. Vascular: No hyperdense vessel.  Atherosclerotic calcifications. Skull: Normal. Negative for fracture or focal lesion. Sinuses/Orbits: Visualized orbits show no acute finding. Mild ethmoid and maxillary sinus mucosal thickening. No significant mastoid effusion. IMPRESSION: 1. No evidence of acute intracranial abnormality. 2. Stable generalized parenchymal atrophy and chronic small vessel ischemic disease. 3. Mild ethmoid and maxillary sinus mucosal thickening. Electronically Signed   By: Kellie Simmering DO   On: 11/25/2019 11:39   DG CHEST PORT 1 VIEW  Result Date: 11/25/2019 CLINICAL DATA:  UTI, sepsis. EXAM: PORTABLE CHEST 1 VIEW COMPARISON:  February 08, 2017 FINDINGS: The lungs are hyperinflated. There is no evidence of acute infiltrate, pleural effusion or pneumothorax. The cardiac silhouette is  within normal limits. Widening of the superior mediastinum is seen on the right. This extends to involve the right hilum. Multilevel degenerative changes seen throughout the thoracic spine. IMPRESSION: 1. Widening of the superior mediastinum on the right. Correlation with contrast-enhanced chest CT is recommended, as aneurysmal dilatation of the ascending thoracic aorta cannot be excluded. 2. No acute or active cardiopulmonary disease. Electronically Signed   By: Virgina Norfolk M.D.   On: 11/25/2019 16:51   DG Hip Unilat W or Wo Pelvis 2-3 Views Left  Result Date: 11/25/2019 CLINICAL DATA:  Pain following fall EXAM: DG HIP (WITH OR WITHOUT PELVIS) 2-3V LEFT COMPARISON:  February 27, 2012 FINDINGS: Frontal pelvis as well as frontal and lateral left hip images were obtained. No acute fracture or dislocation. There is moderately severe joint space narrowing on the right with milder narrowing on the left, unchanged. No erosive changes. Bony overgrowth is noted in the pubic symphysis region, stable. There is degenerative  change in the lower lumbar spine, stable. 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 No fracture or dislocation. Electronically Signed   By: Lowella Grip III M.D.   On: 11/25/2019 11:45     Assessment & Plan:  Plan  I have discontinued Ramey B. Harden's venlafaxine XR. I am also having her maintain her aspirin EC, bisoprolol, furosemide, potassium chloride, Carbidopa-Levodopa ER, Acetaminophen, losartan, and lovastatin.  No orders of the defined types were placed in this encounter.   Problem List Items Addressed This Visit      Unprioritized   Anxiety and depression    Pt does not want meds at this time       Hyperlipidemia    Tolerating statin, encouraged heart healthy diet, avoid trans fats, minimize simple carbs and saturated fats. Increase exercise as tolerated      Memory deficit    con't f/u with neuro       Parkinson disease ([place])    Per neuro       Other Visit Diagnoses    Urinary tract infection without hematuria, site unspecified    -  Primary   Relevant Orders   POCT Urinalysis Dipstick (Automated) (Completed)   Urine Culture      Follow-up: Return in about 6 months (around 06/18/2020), or if symptoms worsen or fail to improve.  Ann Held, DO

## 2019-12-18 NOTE — Patient Instructions (Signed)
Please schedule your next medicare wellness visit with me in 1 yr.  Continue to eat heart healthy diet (full of fruits, vegetables, whole grains, lean protein, water--limit salt, fat, and sugar intake) and increase physical activity as tolerated.  Continue doing brain stimulating activities (puzzles, reading, adult coloring books, staying active) to keep memory sharp.   Bring a copy of your living will and/or healthcare power of attorney to your next office visit.   Elizabeth Mccullough , Thank you for taking time to come for your Medicare Wellness Visit. I appreciate your ongoing commitment to your health goals. Please review the following plan we discussed and let me know if I can assist you in the future.   These are the goals we discussed: Goals     LIFESTYLE - DECREASE FALLS RISK       This is a list of the screening recommended for you and due dates:  Health Maintenance  Topic Date Due   COVID-19 Vaccine (1) Never done   Mammogram  01/05/2018   Flu Shot  02/17/2020   Tetanus Vaccine  04/28/2028   DEXA scan (bone density measurement)  Completed   Pneumonia vaccines  Completed   It is important to avoid accidents which may result in broken bones.  Here are a few ideas on how to make your home safer so you will be less likely to trip or fall.  Use nonskid mats or non slip strips in your shower or tub, on your bathroom floor and around sinks.  If you know that you have spilled water, wipe it up! In the bathroom, it is important to have properly installed grab bars on the walls or on the edge of the tub.  Towel racks are NOT strong enough for you to hold onto or to pull on for support. Stairs and hallways should have enough light.  Add lamps or night lights if you need ore light. It is good to have handrails on both sides of the stairs if possible.  Always fix broken handrails right away. It is important to see the edges of steps.  Paint the edges of outdoor steps white so you  can see them better.  Put colored tape on the edge of inside steps. Throw-rugs are dangerous because they can slide.  Removing the rugs is the best idea, but if they must stay, add adhesive carpet tape to prevent slipping. Do not keep things on stairs or in the halls.  Remove small furniture that blocks the halls as it may cause you to trip.  Keep telephone and electrical cords out of the way where you walk. Always were sturdy, rubber-soled shoes for good support.  Never wear just socks, especially on the stairs.  Socks may cause you to slip or fall.  Do not wear full-length housecoats as you can easily trip on the bottom.  Place the things you use the most on the shelves that are the easiest to reach.  If you use a stepstool, make sure it is in good condition.  If you feel unsteady, DO NOT climb, ask for help. If a health professional advises you to use a cane or walker, do not be ashamed.  These items can keep you from falling and breaking your bones. Preventive Care 53 Years and Older, Female Preventive care refers to lifestyle choices and visits with your health care provider that can promote health and wellness. This includes:  A yearly physical exam. This is also called an annual well check.  Regular dental and eye exams.  Immunizations.  Screening for certain conditions.  Healthy lifestyle choices, such as diet and exercise. What can I expect for my preventive care visit? Physical exam Your health care provider will check:  Height and weight. These may be used to calculate body mass index (BMI), which is a measurement that tells if you are at a healthy weight.  Heart rate and blood pressure.  Your skin for abnormal spots. Counseling Your health care provider may ask you questions about:  Alcohol, tobacco, and drug use.  Emotional well-being.  Home and relationship well-being.  Sexual activity.  Eating habits.  History of falls.  Memory and ability to understand  (cognition).  Work and work Statistician.  Pregnancy and menstrual history. What immunizations do I need?  Influenza (flu) vaccine  This is recommended every year. Tetanus, diphtheria, and pertussis (Tdap) vaccine  You may need a Td booster every 10 years. Varicella (chickenpox) vaccine  You may need this vaccine if you have not already been vaccinated. Zoster (shingles) vaccine  You may need this after age 28. Pneumococcal conjugate (PCV13) vaccine  One dose is recommended after age 38. Pneumococcal polysaccharide (PPSV23) vaccine  One dose is recommended after age 52. Measles, mumps, and rubella (MMR) vaccine  You may need at least one dose of MMR if you were born in 1957 or later. You may also need a second dose. Meningococcal conjugate (MenACWY) vaccine  You may need this if you have certain conditions. Hepatitis A vaccine  You may need this if you have certain conditions or if you travel or work in places where you may be exposed to hepatitis A. Hepatitis B vaccine  You may need this if you have certain conditions or if you travel or work in places where you may be exposed to hepatitis B. Haemophilus influenzae type b (Hib) vaccine  You may need this if you have certain conditions. You may receive vaccines as individual doses or as more than one vaccine together in one shot (combination vaccines). Talk with your health care provider about the risks and benefits of combination vaccines. What tests do I need? Blood tests  Lipid and cholesterol levels. These may be checked every 5 years, or more frequently depending on your overall health.  Hepatitis C test.  Hepatitis B test. Screening  Lung cancer screening. You may have this screening every year starting at age 75 if you have a 30-pack-year history of smoking and currently smoke or have quit within the past 15 years.  Colorectal cancer screening. All adults should have this screening starting at age 46 and  continuing until age 56. Your health care provider may recommend screening at age 6 if you are at increased risk. You will have tests every 1-10 years, depending on your results and the type of screening test.  Diabetes screening. This is done by checking your blood sugar (glucose) after you have not eaten for a while (fasting). You may have this done every 1-3 years.  Mammogram. This may be done every 1-2 years. Talk with your health care provider about how often you should have regular mammograms.  BRCA-related cancer screening. This may be done if you have a family history of breast, ovarian, tubal, or peritoneal cancers. Other tests  Sexually transmitted disease (STD) testing.  Bone density scan. This is done to screen for osteoporosis. You may have this done starting at age 76. Follow these instructions at home: Eating and drinking  Eat a diet that  includes fresh fruits and vegetables, whole grains, lean protein, and low-fat dairy products. Limit your intake of foods with high amounts of sugar, saturated fats, and salt.  Take vitamin and mineral supplements as recommended by your health care provider.  Do not drink alcohol if your health care provider tells you not to drink.  If you drink alcohol: ? Limit how much you have to 0-1 drink a day. ? Be aware of how much alcohol is in your drink. In the [place] one drink equals one 12 oz bottle of beer (355 mL), one 5 oz glass of wine (148 mL), or one 1 oz glass of hard liquor (44 mL). Lifestyle  Take daily care of your teeth and gums.  Stay active. Exercise for at least 30 minutes on 5 or more days each week.  Do not use any products that contain nicotine or tobacco, such as cigarettes, e-cigarettes, and chewing tobacco. If you need help quitting, ask your health care provider.  If you are sexually active, practice safe sex. Use a condom or other form of protection in order to prevent STIs (sexually transmitted infections).  Talk  with your health care provider about taking a low-dose aspirin or statin. What's next?  Go to your health care provider once a year for a well check visit.  Ask your health care provider how often you should have your eyes and teeth checked.  Stay up to date on all vaccines. This information is not intended to replace advice given to you by your health care provider. Make sure you discuss any questions you have with your health care provider. Document Revised: 06/29/2018 Document Reviewed: 06/29/2018 Elsevier Patient Education  2020 [employer].

## 2019-12-18 NOTE — Assessment & Plan Note (Signed)
Tolerating statin, encouraged heart healthy diet, avoid trans fats, minimize simple carbs and saturated fats. Increase exercise as tolerated 

## 2019-12-18 NOTE — Patient Instructions (Signed)
Urinary Tract Infection, Adult A urinary tract infection (UTI) is an infection of any part of the urinary tract. The urinary tract includes:  The kidneys.  The ureters.  The bladder.  The urethra. These organs make, store, and get rid of pee (urine) in the body. What are the causes? This is caused by germs (bacteria) in your genital area. These germs grow and cause swelling (inflammation) of your urinary tract. What increases the risk? You are more likely to develop this condition if:  You have a small, thin tube (catheter) to drain pee.  You cannot control when you pee or poop (incontinence).  You are female, and: ? You use these methods to prevent pregnancy:  A medicine that kills sperm (spermicide).  A device that blocks sperm (diaphragm). ? You have low levels of a female hormone (estrogen). ? You are pregnant.  You have genes that add to your risk.  You are sexually active.  You take antibiotic medicines.  You have trouble peeing because of: ? A prostate that is bigger than normal, if you are female. ? A blockage in the part of your body that drains pee from the bladder (urethra). ? A kidney stone. ? A nerve condition that affects your bladder (neurogenic bladder). ? Not getting enough to drink. ? Not peeing often enough.  You have other conditions, such as: ? Diabetes. ? A weak disease-fighting system (immune system). ? Sickle cell disease. ? Gout. ? Injury of the spine. What are the signs or symptoms? Symptoms of this condition include:  Needing to pee right away (urgently).  Peeing often.  Peeing small amounts often.  Pain or burning when peeing.  Blood in the pee.  Pee that smells bad or not like normal.  Trouble peeing.  Pee that is cloudy.  Fluid coming from the vagina, if you are female.  Pain in the belly or lower back. Other symptoms include:  Throwing up (vomiting).  No urge to eat.  Feeling mixed up (confused).  Being tired  and grouchy (irritable).  A fever.  Watery poop (diarrhea). How is this treated? This condition may be treated with:  Antibiotic medicine.  Other medicines.  Drinking enough water. Follow these instructions at home:  Medicines  Take over-the-counter and prescription medicines only as told by your doctor.  If you were prescribed an antibiotic medicine, take it as told by your doctor. Do not stop taking it even if you start to feel better. General instructions  Make sure you: ? Pee until your bladder is empty. ? Do not hold pee for a long time. ? Empty your bladder after sex. ? Wipe from front to back after pooping if you are a female. Use each tissue one time when you wipe.  Drink enough fluid to keep your pee pale yellow.  Keep all follow-up visits as told by your doctor. This is important. Contact a doctor if:  You do not get better after 1-2 days.  Your symptoms go away and then come back. Get help right away if:  You have very bad back pain.  You have very bad pain in your lower belly.  You have a fever.  You are sick to your stomach (nauseous).  You are throwing up. Summary  A urinary tract infection (UTI) is an infection of any part of the urinary tract.  This condition is caused by germs in your genital area.  There are many risk factors for a UTI. These include having a small, thin   tube to drain pee and not being able to control when you pee or poop.  Treatment includes antibiotic medicines for germs.  Drink enough fluid to keep your pee pale yellow. This information is not intended to replace advice given to you by your health care provider. Make sure you discuss any questions you have with your health care provider. Document Revised: 06/22/2018 Document Reviewed: 01/12/2018 Elsevier Patient Education  2020 [employer]  

## 2019-12-18 NOTE — Assessment & Plan Note (Signed)
con't f/u with neuro

## 2019-12-18 NOTE — Assessment & Plan Note (Signed)
Pt does not want meds at this time

## 2019-12-19 ENCOUNTER — Other Ambulatory Visit: Payer: Self-pay

## 2019-12-19 DIAGNOSIS — M25561 Pain in right knee: Secondary | ICD-10-CM | POA: Diagnosis not present

## 2019-12-19 DIAGNOSIS — R296 Repeated falls: Secondary | ICD-10-CM | POA: Diagnosis not present

## 2019-12-19 DIAGNOSIS — M6281 Muscle weakness (generalized): Secondary | ICD-10-CM | POA: Diagnosis not present

## 2019-12-19 DIAGNOSIS — M25562 Pain in left knee: Secondary | ICD-10-CM | POA: Diagnosis not present

## 2019-12-19 DIAGNOSIS — G20 Parkinson's disease: Secondary | ICD-10-CM | POA: Diagnosis not present

## 2019-12-19 DIAGNOSIS — R2681 Unsteadiness on feet: Secondary | ICD-10-CM | POA: Diagnosis not present

## 2019-12-19 MED ORDER — LEVOFLOXACIN 250 MG PO TABS
250.0000 mg | ORAL_TABLET | Freq: Every day | ORAL | 0 refills | Status: DC
Start: 2019-12-19 — End: 2020-05-13

## 2019-12-20 DIAGNOSIS — M25561 Pain in right knee: Secondary | ICD-10-CM | POA: Diagnosis not present

## 2019-12-20 DIAGNOSIS — R296 Repeated falls: Secondary | ICD-10-CM | POA: Diagnosis not present

## 2019-12-20 DIAGNOSIS — M6281 Muscle weakness (generalized): Secondary | ICD-10-CM | POA: Diagnosis not present

## 2019-12-20 DIAGNOSIS — R498 Other voice and resonance disorders: Secondary | ICD-10-CM | POA: Diagnosis not present

## 2019-12-20 DIAGNOSIS — G20 Parkinson's disease: Secondary | ICD-10-CM | POA: Diagnosis not present

## 2019-12-20 DIAGNOSIS — R41841 Cognitive communication deficit: Secondary | ICD-10-CM | POA: Diagnosis not present

## 2019-12-20 DIAGNOSIS — R2681 Unsteadiness on feet: Secondary | ICD-10-CM | POA: Diagnosis not present

## 2019-12-20 DIAGNOSIS — M25562 Pain in left knee: Secondary | ICD-10-CM | POA: Diagnosis not present

## 2019-12-20 LAB — URINE CULTURE
MICRO NUMBER:: 10538254
SPECIMEN QUALITY:: ADEQUATE

## 2019-12-21 DIAGNOSIS — G20 Parkinson's disease: Secondary | ICD-10-CM | POA: Diagnosis not present

## 2019-12-21 DIAGNOSIS — R498 Other voice and resonance disorders: Secondary | ICD-10-CM | POA: Diagnosis not present

## 2019-12-21 DIAGNOSIS — M6281 Muscle weakness (generalized): Secondary | ICD-10-CM | POA: Diagnosis not present

## 2019-12-21 DIAGNOSIS — R41841 Cognitive communication deficit: Secondary | ICD-10-CM | POA: Diagnosis not present

## 2019-12-21 DIAGNOSIS — R296 Repeated falls: Secondary | ICD-10-CM | POA: Diagnosis not present

## 2019-12-24 DIAGNOSIS — G20 Parkinson's disease: Secondary | ICD-10-CM | POA: Diagnosis not present

## 2019-12-24 DIAGNOSIS — R296 Repeated falls: Secondary | ICD-10-CM | POA: Diagnosis not present

## 2019-12-24 DIAGNOSIS — M6281 Muscle weakness (generalized): Secondary | ICD-10-CM | POA: Diagnosis not present

## 2019-12-25 DIAGNOSIS — M6281 Muscle weakness (generalized): Secondary | ICD-10-CM | POA: Diagnosis not present

## 2019-12-25 DIAGNOSIS — R2681 Unsteadiness on feet: Secondary | ICD-10-CM | POA: Diagnosis not present

## 2019-12-25 DIAGNOSIS — M25562 Pain in left knee: Secondary | ICD-10-CM | POA: Diagnosis not present

## 2019-12-25 DIAGNOSIS — R296 Repeated falls: Secondary | ICD-10-CM | POA: Diagnosis not present

## 2019-12-25 DIAGNOSIS — M25561 Pain in right knee: Secondary | ICD-10-CM | POA: Diagnosis not present

## 2019-12-25 DIAGNOSIS — G20 Parkinson's disease: Secondary | ICD-10-CM | POA: Diagnosis not present

## 2019-12-26 DIAGNOSIS — G20 Parkinson's disease: Secondary | ICD-10-CM | POA: Diagnosis not present

## 2019-12-26 DIAGNOSIS — M25561 Pain in right knee: Secondary | ICD-10-CM | POA: Diagnosis not present

## 2019-12-26 DIAGNOSIS — M6281 Muscle weakness (generalized): Secondary | ICD-10-CM | POA: Diagnosis not present

## 2019-12-26 DIAGNOSIS — M25562 Pain in left knee: Secondary | ICD-10-CM | POA: Diagnosis not present

## 2019-12-26 DIAGNOSIS — R296 Repeated falls: Secondary | ICD-10-CM | POA: Diagnosis not present

## 2019-12-26 DIAGNOSIS — R2681 Unsteadiness on feet: Secondary | ICD-10-CM | POA: Diagnosis not present

## 2019-12-27 DIAGNOSIS — M6281 Muscle weakness (generalized): Secondary | ICD-10-CM | POA: Diagnosis not present

## 2019-12-27 DIAGNOSIS — R296 Repeated falls: Secondary | ICD-10-CM | POA: Diagnosis not present

## 2019-12-27 DIAGNOSIS — G20 Parkinson's disease: Secondary | ICD-10-CM | POA: Diagnosis not present

## 2019-12-28 DIAGNOSIS — M25561 Pain in right knee: Secondary | ICD-10-CM | POA: Diagnosis not present

## 2019-12-28 DIAGNOSIS — R296 Repeated falls: Secondary | ICD-10-CM | POA: Diagnosis not present

## 2019-12-28 DIAGNOSIS — M6281 Muscle weakness (generalized): Secondary | ICD-10-CM | POA: Diagnosis not present

## 2019-12-28 DIAGNOSIS — G20 Parkinson's disease: Secondary | ICD-10-CM | POA: Diagnosis not present

## 2019-12-28 DIAGNOSIS — R2681 Unsteadiness on feet: Secondary | ICD-10-CM | POA: Diagnosis not present

## 2019-12-28 DIAGNOSIS — M25562 Pain in left knee: Secondary | ICD-10-CM | POA: Diagnosis not present

## 2019-12-31 DIAGNOSIS — G20 Parkinson's disease: Secondary | ICD-10-CM | POA: Diagnosis not present

## 2019-12-31 DIAGNOSIS — M6281 Muscle weakness (generalized): Secondary | ICD-10-CM | POA: Diagnosis not present

## 2019-12-31 DIAGNOSIS — M25561 Pain in right knee: Secondary | ICD-10-CM | POA: Diagnosis not present

## 2019-12-31 DIAGNOSIS — R2681 Unsteadiness on feet: Secondary | ICD-10-CM | POA: Diagnosis not present

## 2019-12-31 DIAGNOSIS — M25562 Pain in left knee: Secondary | ICD-10-CM | POA: Diagnosis not present

## 2019-12-31 DIAGNOSIS — R296 Repeated falls: Secondary | ICD-10-CM | POA: Diagnosis not present

## 2020-01-01 DIAGNOSIS — R296 Repeated falls: Secondary | ICD-10-CM | POA: Diagnosis not present

## 2020-01-01 DIAGNOSIS — G20 Parkinson's disease: Secondary | ICD-10-CM | POA: Diagnosis not present

## 2020-01-01 DIAGNOSIS — M6281 Muscle weakness (generalized): Secondary | ICD-10-CM | POA: Diagnosis not present

## 2020-01-02 DIAGNOSIS — G20 Parkinson's disease: Secondary | ICD-10-CM | POA: Diagnosis not present

## 2020-01-02 DIAGNOSIS — R2681 Unsteadiness on feet: Secondary | ICD-10-CM | POA: Diagnosis not present

## 2020-01-02 DIAGNOSIS — M25561 Pain in right knee: Secondary | ICD-10-CM | POA: Diagnosis not present

## 2020-01-02 DIAGNOSIS — M25562 Pain in left knee: Secondary | ICD-10-CM | POA: Diagnosis not present

## 2020-01-02 DIAGNOSIS — R296 Repeated falls: Secondary | ICD-10-CM | POA: Diagnosis not present

## 2020-01-02 DIAGNOSIS — M6281 Muscle weakness (generalized): Secondary | ICD-10-CM | POA: Diagnosis not present

## 2020-01-03 ENCOUNTER — Telehealth: Payer: Self-pay | Admitting: Family Medicine

## 2020-01-03 DIAGNOSIS — R2681 Unsteadiness on feet: Secondary | ICD-10-CM | POA: Diagnosis not present

## 2020-01-03 DIAGNOSIS — M6281 Muscle weakness (generalized): Secondary | ICD-10-CM | POA: Diagnosis not present

## 2020-01-03 DIAGNOSIS — R296 Repeated falls: Secondary | ICD-10-CM | POA: Diagnosis not present

## 2020-01-03 DIAGNOSIS — M25561 Pain in right knee: Secondary | ICD-10-CM | POA: Diagnosis not present

## 2020-01-03 DIAGNOSIS — M25562 Pain in left knee: Secondary | ICD-10-CM | POA: Diagnosis not present

## 2020-01-03 DIAGNOSIS — G20 Parkinson's disease: Secondary | ICD-10-CM | POA: Diagnosis not present

## 2020-01-03 NOTE — Telephone Encounter (Signed)
Try zoloft 50 mg    take 1/2 po qday for 8 days then increase to 1 a day  #30  2 refills  F/u 1 month or sooner prn

## 2020-01-03 NOTE — Telephone Encounter (Signed)
Would you like patient to have another visit?

## 2020-01-03 NOTE — Telephone Encounter (Signed)
Caller Deatra Mcmahen  Call Back # [phone]   Patient would like to try another medication for anxiety.  Please advise   [place], [place] Phone:  [phone]  Fax:  [phone]

## 2020-01-04 DIAGNOSIS — R296 Repeated falls: Secondary | ICD-10-CM | POA: Diagnosis not present

## 2020-01-04 DIAGNOSIS — G20 Parkinson's disease: Secondary | ICD-10-CM | POA: Diagnosis not present

## 2020-01-04 DIAGNOSIS — M6281 Muscle weakness (generalized): Secondary | ICD-10-CM | POA: Diagnosis not present

## 2020-01-04 MED ORDER — SERTRALINE HCL 50 MG PO TABS
ORAL_TABLET | ORAL | 2 refills | Status: DC
Start: 2020-01-04 — End: 2020-03-25

## 2020-01-04 NOTE — Telephone Encounter (Signed)
Spoke with patients daughter. New med sent to pharmacy

## 2020-01-07 DIAGNOSIS — R2681 Unsteadiness on feet: Secondary | ICD-10-CM | POA: Diagnosis not present

## 2020-01-07 DIAGNOSIS — M25561 Pain in right knee: Secondary | ICD-10-CM | POA: Diagnosis not present

## 2020-01-07 DIAGNOSIS — M25562 Pain in left knee: Secondary | ICD-10-CM | POA: Diagnosis not present

## 2020-01-07 DIAGNOSIS — R296 Repeated falls: Secondary | ICD-10-CM | POA: Diagnosis not present

## 2020-01-07 DIAGNOSIS — G20 Parkinson's disease: Secondary | ICD-10-CM | POA: Diagnosis not present

## 2020-01-07 DIAGNOSIS — M6281 Muscle weakness (generalized): Secondary | ICD-10-CM | POA: Diagnosis not present

## 2020-01-08 ENCOUNTER — Other Ambulatory Visit: Payer: Self-pay | Admitting: Cardiovascular Disease

## 2020-01-08 ENCOUNTER — Other Ambulatory Visit: Payer: Self-pay | Admitting: Neurology

## 2020-01-08 DIAGNOSIS — R296 Repeated falls: Secondary | ICD-10-CM | POA: Diagnosis not present

## 2020-01-08 DIAGNOSIS — M25562 Pain in left knee: Secondary | ICD-10-CM | POA: Diagnosis not present

## 2020-01-08 DIAGNOSIS — M6281 Muscle weakness (generalized): Secondary | ICD-10-CM | POA: Diagnosis not present

## 2020-01-08 DIAGNOSIS — M25561 Pain in right knee: Secondary | ICD-10-CM | POA: Diagnosis not present

## 2020-01-08 DIAGNOSIS — G20 Parkinson's disease: Secondary | ICD-10-CM | POA: Diagnosis not present

## 2020-01-08 DIAGNOSIS — R41841 Cognitive communication deficit: Secondary | ICD-10-CM | POA: Diagnosis not present

## 2020-01-08 DIAGNOSIS — R2681 Unsteadiness on feet: Secondary | ICD-10-CM | POA: Diagnosis not present

## 2020-01-08 DIAGNOSIS — R498 Other voice and resonance disorders: Secondary | ICD-10-CM | POA: Diagnosis not present

## 2020-01-09 DIAGNOSIS — G20 Parkinson's disease: Secondary | ICD-10-CM | POA: Diagnosis not present

## 2020-01-09 DIAGNOSIS — M6281 Muscle weakness (generalized): Secondary | ICD-10-CM | POA: Diagnosis not present

## 2020-01-09 DIAGNOSIS — R296 Repeated falls: Secondary | ICD-10-CM | POA: Diagnosis not present

## 2020-01-10 DIAGNOSIS — R41841 Cognitive communication deficit: Secondary | ICD-10-CM | POA: Diagnosis not present

## 2020-01-10 DIAGNOSIS — M25561 Pain in right knee: Secondary | ICD-10-CM | POA: Diagnosis not present

## 2020-01-10 DIAGNOSIS — M6281 Muscle weakness (generalized): Secondary | ICD-10-CM | POA: Diagnosis not present

## 2020-01-10 DIAGNOSIS — M25562 Pain in left knee: Secondary | ICD-10-CM | POA: Diagnosis not present

## 2020-01-10 DIAGNOSIS — G20 Parkinson's disease: Secondary | ICD-10-CM | POA: Diagnosis not present

## 2020-01-10 DIAGNOSIS — R296 Repeated falls: Secondary | ICD-10-CM | POA: Diagnosis not present

## 2020-01-10 DIAGNOSIS — R498 Other voice and resonance disorders: Secondary | ICD-10-CM | POA: Diagnosis not present

## 2020-01-10 DIAGNOSIS — R2681 Unsteadiness on feet: Secondary | ICD-10-CM | POA: Diagnosis not present

## 2020-01-11 DIAGNOSIS — G20 Parkinson's disease: Secondary | ICD-10-CM | POA: Diagnosis not present

## 2020-01-11 DIAGNOSIS — M25561 Pain in right knee: Secondary | ICD-10-CM | POA: Diagnosis not present

## 2020-01-11 DIAGNOSIS — R296 Repeated falls: Secondary | ICD-10-CM | POA: Diagnosis not present

## 2020-01-11 DIAGNOSIS — R2681 Unsteadiness on feet: Secondary | ICD-10-CM | POA: Diagnosis not present

## 2020-01-11 DIAGNOSIS — M25562 Pain in left knee: Secondary | ICD-10-CM | POA: Diagnosis not present

## 2020-01-11 DIAGNOSIS — M6281 Muscle weakness (generalized): Secondary | ICD-10-CM | POA: Diagnosis not present

## 2020-01-14 ENCOUNTER — Telehealth: Payer: Self-pay

## 2020-01-14 DIAGNOSIS — M6281 Muscle weakness (generalized): Secondary | ICD-10-CM | POA: Diagnosis not present

## 2020-01-14 DIAGNOSIS — R296 Repeated falls: Secondary | ICD-10-CM | POA: Diagnosis not present

## 2020-01-14 DIAGNOSIS — G20 Parkinson's disease: Secondary | ICD-10-CM | POA: Diagnosis not present

## 2020-01-14 NOTE — Telephone Encounter (Signed)
She should take 1/2 tab for 8 days then 1 a day --- it should have been on the bottle

## 2020-01-14 NOTE — Telephone Encounter (Signed)
Please advise 

## 2020-01-14 NOTE — Telephone Encounter (Signed)
---  Caller states she wants to know the dosage and side effects of her anxiety medication. Sertaline 50 mg, Told her the directions should be on he prescription bottle or on the papers that came with it. She stated she would call the doctor on Monday.

## 2020-01-15 DIAGNOSIS — M25562 Pain in left knee: Secondary | ICD-10-CM | POA: Diagnosis not present

## 2020-01-15 DIAGNOSIS — M25561 Pain in right knee: Secondary | ICD-10-CM | POA: Diagnosis not present

## 2020-01-15 DIAGNOSIS — R2681 Unsteadiness on feet: Secondary | ICD-10-CM | POA: Diagnosis not present

## 2020-01-15 DIAGNOSIS — G20 Parkinson's disease: Secondary | ICD-10-CM | POA: Diagnosis not present

## 2020-01-15 DIAGNOSIS — M6281 Muscle weakness (generalized): Secondary | ICD-10-CM | POA: Diagnosis not present

## 2020-01-15 DIAGNOSIS — R296 Repeated falls: Secondary | ICD-10-CM | POA: Diagnosis not present

## 2020-01-16 DIAGNOSIS — M6281 Muscle weakness (generalized): Secondary | ICD-10-CM | POA: Diagnosis not present

## 2020-01-16 DIAGNOSIS — M25562 Pain in left knee: Secondary | ICD-10-CM | POA: Diagnosis not present

## 2020-01-16 DIAGNOSIS — R2681 Unsteadiness on feet: Secondary | ICD-10-CM | POA: Diagnosis not present

## 2020-01-16 DIAGNOSIS — R296 Repeated falls: Secondary | ICD-10-CM | POA: Diagnosis not present

## 2020-01-16 DIAGNOSIS — G20 Parkinson's disease: Secondary | ICD-10-CM | POA: Diagnosis not present

## 2020-01-16 DIAGNOSIS — M25561 Pain in right knee: Secondary | ICD-10-CM | POA: Diagnosis not present

## 2020-01-17 DIAGNOSIS — R2681 Unsteadiness on feet: Secondary | ICD-10-CM | POA: Diagnosis not present

## 2020-01-17 DIAGNOSIS — M6281 Muscle weakness (generalized): Secondary | ICD-10-CM | POA: Diagnosis not present

## 2020-01-17 DIAGNOSIS — M25562 Pain in left knee: Secondary | ICD-10-CM | POA: Diagnosis not present

## 2020-01-17 DIAGNOSIS — M25561 Pain in right knee: Secondary | ICD-10-CM | POA: Diagnosis not present

## 2020-01-18 NOTE — Telephone Encounter (Signed)
Attempted to call patient. Call dropped to dead air

## 2020-01-21 DIAGNOSIS — M25561 Pain in right knee: Secondary | ICD-10-CM | POA: Diagnosis not present

## 2020-01-21 DIAGNOSIS — M25562 Pain in left knee: Secondary | ICD-10-CM | POA: Diagnosis not present

## 2020-01-21 DIAGNOSIS — R2681 Unsteadiness on feet: Secondary | ICD-10-CM | POA: Diagnosis not present

## 2020-01-21 DIAGNOSIS — M6281 Muscle weakness (generalized): Secondary | ICD-10-CM | POA: Diagnosis not present

## 2020-01-22 DIAGNOSIS — M6281 Muscle weakness (generalized): Secondary | ICD-10-CM | POA: Diagnosis not present

## 2020-01-22 DIAGNOSIS — M25562 Pain in left knee: Secondary | ICD-10-CM | POA: Diagnosis not present

## 2020-01-22 DIAGNOSIS — M25561 Pain in right knee: Secondary | ICD-10-CM | POA: Diagnosis not present

## 2020-01-22 DIAGNOSIS — R2681 Unsteadiness on feet: Secondary | ICD-10-CM | POA: Diagnosis not present

## 2020-01-23 ENCOUNTER — Other Ambulatory Visit: Payer: Self-pay | Admitting: Cardiovascular Disease

## 2020-01-23 DIAGNOSIS — M25562 Pain in left knee: Secondary | ICD-10-CM | POA: Diagnosis not present

## 2020-01-23 DIAGNOSIS — R2681 Unsteadiness on feet: Secondary | ICD-10-CM | POA: Diagnosis not present

## 2020-01-23 DIAGNOSIS — M25561 Pain in right knee: Secondary | ICD-10-CM | POA: Diagnosis not present

## 2020-01-23 DIAGNOSIS — M6281 Muscle weakness (generalized): Secondary | ICD-10-CM | POA: Diagnosis not present

## 2020-01-23 NOTE — Telephone Encounter (Signed)
Spoke with patient. Pt seemed slightly confused and believes she has started this medication and states not having side effects. FYI

## 2020-01-24 DIAGNOSIS — M25561 Pain in right knee: Secondary | ICD-10-CM | POA: Diagnosis not present

## 2020-01-24 DIAGNOSIS — M6281 Muscle weakness (generalized): Secondary | ICD-10-CM | POA: Diagnosis not present

## 2020-01-24 DIAGNOSIS — R2681 Unsteadiness on feet: Secondary | ICD-10-CM | POA: Diagnosis not present

## 2020-01-24 DIAGNOSIS — M25562 Pain in left knee: Secondary | ICD-10-CM | POA: Diagnosis not present

## 2020-01-24 NOTE — Telephone Encounter (Signed)
Left VM for daughter to call back. 

## 2020-01-24 NOTE — Telephone Encounter (Signed)
Probably should make sure her daughter knows the directions

## 2020-01-25 DIAGNOSIS — M25562 Pain in left knee: Secondary | ICD-10-CM | POA: Diagnosis not present

## 2020-01-25 DIAGNOSIS — R2681 Unsteadiness on feet: Secondary | ICD-10-CM | POA: Diagnosis not present

## 2020-01-25 DIAGNOSIS — M6281 Muscle weakness (generalized): Secondary | ICD-10-CM | POA: Diagnosis not present

## 2020-01-25 DIAGNOSIS — M25561 Pain in right knee: Secondary | ICD-10-CM | POA: Diagnosis not present

## 2020-01-28 ENCOUNTER — Other Ambulatory Visit: Payer: Self-pay | Admitting: Neurology

## 2020-01-28 ENCOUNTER — Other Ambulatory Visit: Payer: Self-pay | Admitting: Cardiovascular Disease

## 2020-01-28 DIAGNOSIS — M25562 Pain in left knee: Secondary | ICD-10-CM | POA: Diagnosis not present

## 2020-01-28 DIAGNOSIS — R2681 Unsteadiness on feet: Secondary | ICD-10-CM | POA: Diagnosis not present

## 2020-01-28 DIAGNOSIS — M6281 Muscle weakness (generalized): Secondary | ICD-10-CM | POA: Diagnosis not present

## 2020-01-28 DIAGNOSIS — M25561 Pain in right knee: Secondary | ICD-10-CM | POA: Diagnosis not present

## 2020-01-29 DIAGNOSIS — M6281 Muscle weakness (generalized): Secondary | ICD-10-CM | POA: Diagnosis not present

## 2020-01-29 DIAGNOSIS — M25562 Pain in left knee: Secondary | ICD-10-CM | POA: Diagnosis not present

## 2020-01-29 DIAGNOSIS — M25561 Pain in right knee: Secondary | ICD-10-CM | POA: Diagnosis not present

## 2020-01-29 DIAGNOSIS — R2681 Unsteadiness on feet: Secondary | ICD-10-CM | POA: Diagnosis not present

## 2020-01-30 DIAGNOSIS — M25562 Pain in left knee: Secondary | ICD-10-CM | POA: Diagnosis not present

## 2020-01-30 DIAGNOSIS — M25561 Pain in right knee: Secondary | ICD-10-CM | POA: Diagnosis not present

## 2020-01-30 DIAGNOSIS — M6281 Muscle weakness (generalized): Secondary | ICD-10-CM | POA: Diagnosis not present

## 2020-01-30 DIAGNOSIS — R2681 Unsteadiness on feet: Secondary | ICD-10-CM | POA: Diagnosis not present

## 2020-01-31 DIAGNOSIS — M6281 Muscle weakness (generalized): Secondary | ICD-10-CM | POA: Diagnosis not present

## 2020-01-31 DIAGNOSIS — M25562 Pain in left knee: Secondary | ICD-10-CM | POA: Diagnosis not present

## 2020-01-31 DIAGNOSIS — M25561 Pain in right knee: Secondary | ICD-10-CM | POA: Diagnosis not present

## 2020-01-31 DIAGNOSIS — R2681 Unsteadiness on feet: Secondary | ICD-10-CM | POA: Diagnosis not present

## 2020-02-01 DIAGNOSIS — M25562 Pain in left knee: Secondary | ICD-10-CM | POA: Diagnosis not present

## 2020-02-01 DIAGNOSIS — M6281 Muscle weakness (generalized): Secondary | ICD-10-CM | POA: Diagnosis not present

## 2020-02-01 DIAGNOSIS — R2681 Unsteadiness on feet: Secondary | ICD-10-CM | POA: Diagnosis not present

## 2020-02-01 DIAGNOSIS — M25561 Pain in right knee: Secondary | ICD-10-CM | POA: Diagnosis not present

## 2020-02-04 DIAGNOSIS — M6281 Muscle weakness (generalized): Secondary | ICD-10-CM | POA: Diagnosis not present

## 2020-02-04 DIAGNOSIS — R2681 Unsteadiness on feet: Secondary | ICD-10-CM | POA: Diagnosis not present

## 2020-02-04 DIAGNOSIS — H5203 Hypermetropia, bilateral: Secondary | ICD-10-CM | POA: Diagnosis not present

## 2020-02-04 DIAGNOSIS — M25561 Pain in right knee: Secondary | ICD-10-CM | POA: Diagnosis not present

## 2020-02-04 DIAGNOSIS — M25562 Pain in left knee: Secondary | ICD-10-CM | POA: Diagnosis not present

## 2020-02-05 ENCOUNTER — Other Ambulatory Visit: Payer: Self-pay

## 2020-02-05 ENCOUNTER — Encounter: Payer: Self-pay | Admitting: Cardiovascular Disease

## 2020-02-05 ENCOUNTER — Ambulatory Visit (INDEPENDENT_AMBULATORY_CARE_PROVIDER_SITE_OTHER): Payer: Medicare Other | Admitting: Cardiovascular Disease

## 2020-02-05 VITALS — BP 96/58 | HR 72 | Ht 62.0 in | Wt 131.6 lb

## 2020-02-05 DIAGNOSIS — M25561 Pain in right knee: Secondary | ICD-10-CM | POA: Diagnosis not present

## 2020-02-05 DIAGNOSIS — I5042 Chronic combined systolic (congestive) and diastolic (congestive) heart failure: Secondary | ICD-10-CM

## 2020-02-05 DIAGNOSIS — M6281 Muscle weakness (generalized): Secondary | ICD-10-CM | POA: Diagnosis not present

## 2020-02-05 DIAGNOSIS — M25562 Pain in left knee: Secondary | ICD-10-CM | POA: Diagnosis not present

## 2020-02-05 DIAGNOSIS — G20A1 Parkinson's disease without dyskinesia, without mention of fluctuations: Secondary | ICD-10-CM

## 2020-02-05 DIAGNOSIS — G20 Parkinson's disease: Secondary | ICD-10-CM

## 2020-02-05 DIAGNOSIS — R2681 Unsteadiness on feet: Secondary | ICD-10-CM | POA: Diagnosis not present

## 2020-02-05 NOTE — Patient Instructions (Signed)
Medication Instructions:  Your physician has recommended you make the following change in your medication: stop losartan    *If you need a refill on your cardiac medications before your next appointment, please call your pharmacy*   Lab Work: none If you have labs (blood work) drawn today and your tests are completely normal, you will receive your results only by: Marland Kitchen MyChart Message (if you have MyChart) OR . A paper copy in the mail If you have any lab test that is abnormal or we need to change your treatment, we will call you to review the results.   Testing/Procedures: none   Follow-Up: At [facility], you and your health needs are our priority.  As part of our continuing mission to provide you with exceptional heart care, we have created designated Provider Care Teams.  These Care Teams include your primary Cardiologist (physician) and Advanced Practice Providers (APPs -  Physician Assistants and Nurse Practitioners) who all work together to provide you with the care you need, when you need it.  We recommend signing up for the patient portal called "MyChart".  Sign up information is provided on this After Visit Summary.  MyChart is used to connect with patients for Virtual Visits (Telemedicine).  Patients are able to view lab/test results, encounter notes, upcoming appointments, etc.  Non-urgent messages can be sent to your provider as well.   To learn more about what you can do with MyChart, go to [email or web address].    Your next appointment:   1 year(s)  The format for your next appointment:   In Person  Provider:   Mertie Moores, MD   Other Instructions

## 2020-02-05 NOTE — Progress Notes (Signed)
Date:  02/05/2020   ID:  RONNETTE RUMP, DOB 14-Dec-1940, MRN [id number]  PCP:  Carollee Herter, Alferd Apa, DO  Cardiologist:  Mertie Moores, MD    Referring MD: Carollee Herter, Alferd Apa, *   Problem list 1. 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 Hyperlipidemia 3.  Parkinson's disease 4.  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 3  5.  Mild carotid artery disease  Chief Complaint  Patient presents with  . Congestive Heart Failure  . Aortic Insuffiency    Elizabeth Mccullough is a 79 y.o. female with a hx of Parkinsons, hyperlipidemia . 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 Recent echo shows new EF of 20-25%.  Previous echo in 2013  showed EF 55-60%.  Increased shortness of breath for the past several months .   No PND or orthopnea. No leg swelling .    Does not eat much salt  No CP.  Not able to exercise much secondary to frequent falling and parkinson .  Walks with a cane.   Oct. 23, 2018:     Elizabeth Mccullough had a cath since her last office visit Normal coronaries. 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 She is followed by Dr. Darcey Nora.   Was started on Coreg.   Is not as short of breath as she was previoiusly  She eats a very low-salt diet.  August 03, 2017: Elizabeth Mccullough is seen today for follow-up of her chronic combined systolic and diastolic congestive heart failure.  She has had a heart catheterization and was found to have normal coronary arteries.  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  She is seen and has been followed by Dr. Prescott Gum. No CP or dyspnea. Has some fatigue,  Is tired frequently .   BP is low ( on Losartan and Sinimet)   CHF was diagnosed in Aug. 2018.   She is not feeling any better since that time   Sept. 18, 2019:  Elizabeth Mccullough is seen back today for follow-up of her chronic  combined systolic and diastolic congestive heart failure.  She has normal coronary arteries by heart catheterization.   Most recent echocardiogram in January, 2019 reveals normal left ventricular systolic function with ejection fraction of 55 to 60%.  She has grade 1 diastolic dysfunction.  There is moderate aortic insufficiency.  She has moderate aortic insufficiency with mild dilatation of the aortic root and has been followed by Dr. Prescott Gum.  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  There is been no change of the past several years.  She is had some problems with orthostasis in the past.  We have titrated her medications slightly.  Has some questions for me today   She has stopped her Coreg ( daughter recommended that she stop it )  She also stopped her Losartan   Im not clear why she has stopped her Coreg and Losartan .  February 05, 2020: Elizabeth Mccullough is seen today for follow-up of her congestive heart failure.  She has chronic combined systolic and diastolic congestive heart failure.  She has normal coronary arteries by heart catheterization.  Her last echocardiogram was from January, 2019 which reveals normal left ventricular systolic function.  She has grade 1 diastolic  dysfunction.  Has generally low energy  Has Parkinsons diesease.   Past Medical History:  Diagnosis Date  . AAA (abdominal aortic aneurysm) ([place])   . Carotid artery disease ([place]) 01/12/2017   Carotid US 6/18: Bilat < 50%; repeat in 12/2000  . Chronic combined systolic and diastolic CHF (congestive heart failure) ([place]) 03/01/2017   NICM // [place] 8/18: normal coronary arteries /  Echo 8/18: EF 20-25, mod AI, mod MR, PASP 32 // Echo 1/19: EF 55-60, no RWMA, Gr 1 DD, mod AI, mild MR, PASP 32  . Hyperlipidemia   . Parkinson's disease [facility])     Past Surgical History:  Procedure Laterality Date  . BREAST CYST ASPIRATION  1965   Right Breast  . HAMMER TOE SURGERY   04/10/02   Left Toe  . KNEE SURGERY Right 10/17/15   meniscus repair  . NASAL SEPTUM SURGERY  1980  . RIGHT/LEFT HEART CATH AND CORONARY ANGIOGRAPHY N/A 03/03/2017   Procedure: RIGHT/LEFT HEART CATH AND CORONARY ANGIOGRAPHY;  Surgeon: Nelva Bush, MD;  Location: [facility];  Service: Cardiovascular;  Laterality: N/A;  . THORACIC AORTOGRAM N/A 03/03/2017   Procedure: Thoracic Aortogram;  Surgeon: Nelva Bush, MD;  Location: [facility];  Service: Cardiovascular;  Laterality: N/A;    Current Medications: Current Meds  Medication Sig  . Acetaminophen 325 MG CAPS Take 1-2 capsules by mouth every 8 (eight) hours as needed (moderate to severe pain).  Marland Kitchen aspirin EC 81 MG tablet Take 1 tablet (81 mg total) by mouth daily.  . bisoprolol (ZEBETA) 5 MG tablet Take 0.5 tablets (2.5 mg total) by mouth daily. Please make overdue appt with Dr. Acie Fredrickson before anymore refills. 1st attempt  . Carbidopa-Levodopa ER (SINEMET CR) 25-100 MG tablet controlled release TAKE 2 TABLETS BY MOUTH 3 TIMES A DAY  . furosemide (LASIX) 20 MG tablet Take 1 tablet (20 mg total) by mouth daily.  Marland Kitchen levofloxacin (LEVAQUIN) 250 MG tablet Take 1 tablet (250 mg total) by mouth daily.  Marland Kitchen lovastatin (MEVACOR) 20 MG tablet Take 1 tablet (20 mg total) by mouth at bedtime.  . potassium chloride (KLOR-CON) 10 MEQ tablet Take 1 tablet (10 mEq total) by mouth daily.  . sertraline (ZOLOFT) 50 MG tablet take 1/2 tablet  for 8 days then increase to 1 tablet a day  . [DISCONTINUED] losartan (COZAAR) 25 MG tablet TAKE 1 TABLET BY MOUTH ONCE DAILY ** PLEASE MAKE YEARLY APPOINTMENT FOR JULY FOR FUTURE REFILLS     Allergies:   Iohexol and Penicillins   Social History   Socioeconomic History  . Marital status: Widowed    Spouse name: Not on file  . Number of children: 4  . Years of education: Not on file  . Highest education level: 12th grade  Occupational History  . Occupation: retired  Tobacco Use  . Smoking  status: Never Smoker  . Smokeless tobacco: Never Used  Vaping Use  . Vaping Use: Never used  Substance and Sexual Activity  . Alcohol use: Yes    Comment: social wine   . Drug use: No  . Sexual activity: Not Currently    Partners: Male  Other Topics Concern  . Not on file  Social History Narrative   Exercise-- no   Pt lives with daughter Elizabeth Mccullough at home   Social Determinants of Health   Financial Resource Strain: Low Risk   . Difficulty of Paying Living Expenses: Not hard at all  Food Insecurity: No Food Insecurity  .  Worried About Charity fundraiser in the Last Year: Never true  . Ran Out of Food in the Last Year: Never true  Transportation Needs: No Transportation Needs  . Lack of Transportation (Medical): No  . Lack of Transportation (Non-Medical): No  Physical Activity:   . Days of Exercise per Week:   . Minutes of Exercise per Session:   Stress:   . Feeling of Stress :   Social Connections:   . Frequency of Communication with Friends and Family:   . Frequency of Social Gatherings with Friends and Family:   . Attends Religious Services:   . Active Member of Clubs or Organizations:   . Attends Archivist Meetings:   Marland Kitchen Marital Status:      Family History: The patient's family history includes Breast cancer in an other family member; Cancer in her sister; Cancer (age of onset: 54) in her sister; Colon cancer in an other family member; Diabetes in her daughter and sister; Healthy in her daughter; Heart disease in her mother; Hyperlipidemia in her brother, sister, and sister; Hypertension in her brother; Prostate cancer in her son; Stomach cancer in her daughter; Stroke in her brother. ROS:   Please see the history of present illness.     All other systems reviewed and are negative.  EKGs/Labs/Other Studies Reviewed:    The following studies were reviewed today: recoreds from Dr. Prescott Gum  EKG:    Sept. 18, 2019:     NSR at 78.   LBBB.     Recent  Labs: 11/28/2019: ALT 9; BUN 21; Creatinine, Ser 0.75; Hemoglobin 10.1; Magnesium 2.0; Platelets 259; Potassium 3.9; Sodium 141; TSH 2.878  Recent Lipid Panel    Component Value Date/Time   CHOL 166 09/17/2019 1350   CHOL 171 01/31/2019 0913   TRIG 95.0 09/17/2019 1350   TRIG 78 07/04/2006 1313   HDL 57.60 09/17/2019 1350   HDL 72 01/31/2019 0913   CHOLHDL 3 09/17/2019 1350   VLDL 19.0 09/17/2019 1350   LDLCALC 89 09/17/2019 1350   LDLCALC 88 01/31/2019 0913    Physical Exam:    Physical Exam: Blood pressure (!) 96/58, pulse 72, height 5\' 2"  (1.575 m), weight 131 lb 9.6 oz (59.7 kg), last menstrual period 07/19/1988.  GEN:   Frail,, chronically ill appearing female.  HEENT: Normal NECK: No JVD; No carotid bruits LYMPHATICS: No lymphadenopathy CARDIAC: RRR , soft systolic murmur. RESPIRATORY:  Clear to auscultation without rales, wheezing or rhonchi  ABDOMEN: Soft, non-tender, non-distended MUSCULOSKELETAL:  No edema; No deformity  SKIN: Warm and dry NEUROLOGIC:  Alert and oriented x 3    ASSESSMENT:    No diagnosis found. PLAN:       Chronic combined systolic / diastolic CHF:   Her last echocardiogram in 2019 revealed well-preserved left ventricular systolic function.  She does have some dyssynergy related to her bundle branch block.  She seems to have responded well on medical therapy but today her blood pressure is too low and I would like to discontinue the losartan. She has a history of frequent falling and I worry that the combination of her Sinemet and her Parkinson's disease will cause her to have worsening episodes of orthostatic hypotension.  We will discontinue the losartan and she will follow up with her primary medical doctor.  2. Aortic insufficiency -  Her aortic valve sounds about the same.  She has a soft systolic murmur.   3. Mitral regurgitation -    4.  Aortic root dilatation: She's been followed by Dr. Prescott Gum.    Medication  Adjustments/Labs and Tests Ordered: Current medicines are reviewed at length with the patient today.  Concerns regarding medicines are outlined above.  No orders of the defined types were placed in this encounter.  No orders of the defined types were placed in this encounter.    Signed, Mertie Moores, MD  02/05/2020 6:21 PM    [place]

## 2020-02-06 DIAGNOSIS — R2681 Unsteadiness on feet: Secondary | ICD-10-CM | POA: Diagnosis not present

## 2020-02-06 DIAGNOSIS — M25562 Pain in left knee: Secondary | ICD-10-CM | POA: Diagnosis not present

## 2020-02-06 DIAGNOSIS — M25561 Pain in right knee: Secondary | ICD-10-CM | POA: Diagnosis not present

## 2020-02-06 DIAGNOSIS — M6281 Muscle weakness (generalized): Secondary | ICD-10-CM | POA: Diagnosis not present

## 2020-02-07 DIAGNOSIS — R2681 Unsteadiness on feet: Secondary | ICD-10-CM | POA: Diagnosis not present

## 2020-02-07 DIAGNOSIS — M25562 Pain in left knee: Secondary | ICD-10-CM | POA: Diagnosis not present

## 2020-02-07 DIAGNOSIS — M25561 Pain in right knee: Secondary | ICD-10-CM | POA: Diagnosis not present

## 2020-02-07 DIAGNOSIS — M6281 Muscle weakness (generalized): Secondary | ICD-10-CM | POA: Diagnosis not present

## 2020-02-08 ENCOUNTER — Other Ambulatory Visit: Payer: Self-pay | Admitting: Cardiovascular Disease

## 2020-02-08 DIAGNOSIS — R2681 Unsteadiness on feet: Secondary | ICD-10-CM | POA: Diagnosis not present

## 2020-02-08 DIAGNOSIS — M6281 Muscle weakness (generalized): Secondary | ICD-10-CM | POA: Diagnosis not present

## 2020-02-08 DIAGNOSIS — M25561 Pain in right knee: Secondary | ICD-10-CM | POA: Diagnosis not present

## 2020-02-08 DIAGNOSIS — M25562 Pain in left knee: Secondary | ICD-10-CM | POA: Diagnosis not present

## 2020-02-12 DIAGNOSIS — M25562 Pain in left knee: Secondary | ICD-10-CM | POA: Diagnosis not present

## 2020-02-12 DIAGNOSIS — M6281 Muscle weakness (generalized): Secondary | ICD-10-CM | POA: Diagnosis not present

## 2020-02-12 DIAGNOSIS — M25561 Pain in right knee: Secondary | ICD-10-CM | POA: Diagnosis not present

## 2020-02-12 DIAGNOSIS — R2681 Unsteadiness on feet: Secondary | ICD-10-CM | POA: Diagnosis not present

## 2020-02-13 DIAGNOSIS — M6281 Muscle weakness (generalized): Secondary | ICD-10-CM | POA: Diagnosis not present

## 2020-02-13 DIAGNOSIS — M25561 Pain in right knee: Secondary | ICD-10-CM | POA: Diagnosis not present

## 2020-02-13 DIAGNOSIS — M25562 Pain in left knee: Secondary | ICD-10-CM | POA: Diagnosis not present

## 2020-02-13 DIAGNOSIS — R2681 Unsteadiness on feet: Secondary | ICD-10-CM | POA: Diagnosis not present

## 2020-02-14 DIAGNOSIS — M25562 Pain in left knee: Secondary | ICD-10-CM | POA: Diagnosis not present

## 2020-02-14 DIAGNOSIS — M6281 Muscle weakness (generalized): Secondary | ICD-10-CM | POA: Diagnosis not present

## 2020-02-14 DIAGNOSIS — R2681 Unsteadiness on feet: Secondary | ICD-10-CM | POA: Diagnosis not present

## 2020-02-14 DIAGNOSIS — M25561 Pain in right knee: Secondary | ICD-10-CM | POA: Diagnosis not present

## 2020-02-15 DIAGNOSIS — M6281 Muscle weakness (generalized): Secondary | ICD-10-CM | POA: Diagnosis not present

## 2020-02-15 DIAGNOSIS — M25562 Pain in left knee: Secondary | ICD-10-CM | POA: Diagnosis not present

## 2020-02-15 DIAGNOSIS — M25561 Pain in right knee: Secondary | ICD-10-CM | POA: Diagnosis not present

## 2020-02-15 DIAGNOSIS — R2681 Unsteadiness on feet: Secondary | ICD-10-CM | POA: Diagnosis not present

## 2020-02-18 DIAGNOSIS — M6281 Muscle weakness (generalized): Secondary | ICD-10-CM | POA: Diagnosis not present

## 2020-02-18 DIAGNOSIS — R2681 Unsteadiness on feet: Secondary | ICD-10-CM | POA: Diagnosis not present

## 2020-02-18 DIAGNOSIS — M25562 Pain in left knee: Secondary | ICD-10-CM | POA: Diagnosis not present

## 2020-02-18 DIAGNOSIS — R41841 Cognitive communication deficit: Secondary | ICD-10-CM | POA: Diagnosis not present

## 2020-02-18 DIAGNOSIS — R498 Other voice and resonance disorders: Secondary | ICD-10-CM | POA: Diagnosis not present

## 2020-02-18 DIAGNOSIS — M25561 Pain in right knee: Secondary | ICD-10-CM | POA: Diagnosis not present

## 2020-02-19 DIAGNOSIS — M6281 Muscle weakness (generalized): Secondary | ICD-10-CM | POA: Diagnosis not present

## 2020-02-19 DIAGNOSIS — M25562 Pain in left knee: Secondary | ICD-10-CM | POA: Diagnosis not present

## 2020-02-19 DIAGNOSIS — M25561 Pain in right knee: Secondary | ICD-10-CM | POA: Diagnosis not present

## 2020-02-19 DIAGNOSIS — R2681 Unsteadiness on feet: Secondary | ICD-10-CM | POA: Diagnosis not present

## 2020-02-20 DIAGNOSIS — G20 Parkinson's disease: Secondary | ICD-10-CM | POA: Diagnosis not present

## 2020-02-20 DIAGNOSIS — M6281 Muscle weakness (generalized): Secondary | ICD-10-CM | POA: Diagnosis not present

## 2020-02-20 DIAGNOSIS — R498 Other voice and resonance disorders: Secondary | ICD-10-CM | POA: Diagnosis not present

## 2020-02-20 DIAGNOSIS — M25562 Pain in left knee: Secondary | ICD-10-CM | POA: Diagnosis not present

## 2020-02-20 DIAGNOSIS — R2681 Unsteadiness on feet: Secondary | ICD-10-CM | POA: Diagnosis not present

## 2020-02-20 DIAGNOSIS — M25561 Pain in right knee: Secondary | ICD-10-CM | POA: Diagnosis not present

## 2020-02-20 DIAGNOSIS — R296 Repeated falls: Secondary | ICD-10-CM | POA: Diagnosis not present

## 2020-02-20 DIAGNOSIS — R41841 Cognitive communication deficit: Secondary | ICD-10-CM | POA: Diagnosis not present

## 2020-02-22 DIAGNOSIS — R296 Repeated falls: Secondary | ICD-10-CM | POA: Diagnosis not present

## 2020-02-22 DIAGNOSIS — M25562 Pain in left knee: Secondary | ICD-10-CM | POA: Diagnosis not present

## 2020-02-22 DIAGNOSIS — R2681 Unsteadiness on feet: Secondary | ICD-10-CM | POA: Diagnosis not present

## 2020-02-22 DIAGNOSIS — G20 Parkinson's disease: Secondary | ICD-10-CM | POA: Diagnosis not present

## 2020-02-22 DIAGNOSIS — M6281 Muscle weakness (generalized): Secondary | ICD-10-CM | POA: Diagnosis not present

## 2020-02-22 DIAGNOSIS — M25561 Pain in right knee: Secondary | ICD-10-CM | POA: Diagnosis not present

## 2020-02-25 DIAGNOSIS — M25562 Pain in left knee: Secondary | ICD-10-CM | POA: Diagnosis not present

## 2020-02-25 DIAGNOSIS — M25561 Pain in right knee: Secondary | ICD-10-CM | POA: Diagnosis not present

## 2020-02-25 DIAGNOSIS — M6281 Muscle weakness (generalized): Secondary | ICD-10-CM | POA: Diagnosis not present

## 2020-02-25 DIAGNOSIS — R498 Other voice and resonance disorders: Secondary | ICD-10-CM | POA: Diagnosis not present

## 2020-02-25 DIAGNOSIS — R2681 Unsteadiness on feet: Secondary | ICD-10-CM | POA: Diagnosis not present

## 2020-02-25 DIAGNOSIS — R41841 Cognitive communication deficit: Secondary | ICD-10-CM | POA: Diagnosis not present

## 2020-02-26 ENCOUNTER — Encounter: Payer: Medicare Other | Attending: Psychology | Admitting: Psychology

## 2020-02-26 ENCOUNTER — Other Ambulatory Visit: Payer: Self-pay

## 2020-02-26 DIAGNOSIS — R413 Other amnesia: Secondary | ICD-10-CM | POA: Insufficient documentation

## 2020-02-26 DIAGNOSIS — F329 Major depressive disorder, single episode, unspecified: Secondary | ICD-10-CM | POA: Diagnosis not present

## 2020-02-26 DIAGNOSIS — R2681 Unsteadiness on feet: Secondary | ICD-10-CM | POA: Diagnosis not present

## 2020-02-26 DIAGNOSIS — M25562 Pain in left knee: Secondary | ICD-10-CM | POA: Diagnosis not present

## 2020-02-26 DIAGNOSIS — G20 Parkinson's disease: Secondary | ICD-10-CM | POA: Diagnosis not present

## 2020-02-26 DIAGNOSIS — R296 Repeated falls: Secondary | ICD-10-CM | POA: Diagnosis not present

## 2020-02-26 DIAGNOSIS — F419 Anxiety disorder, unspecified: Secondary | ICD-10-CM | POA: Diagnosis not present

## 2020-02-26 DIAGNOSIS — F32A Depression, unspecified: Secondary | ICD-10-CM

## 2020-02-26 DIAGNOSIS — M6281 Muscle weakness (generalized): Secondary | ICD-10-CM | POA: Diagnosis not present

## 2020-02-26 DIAGNOSIS — M25561 Pain in right knee: Secondary | ICD-10-CM | POA: Diagnosis not present

## 2020-02-26 NOTE — Progress Notes (Signed)
Neuropsychological Consultation   Patient:   Elizabeth Mccullough   DOB:   20-Feb-1941  MR Number:  [id number]  Location:  [facility] [place], [place] [id number] [place] [address] Dept: [phone]           Date of Service:   02/26/2020  Start Time:   10 AM End Time:   11:30 AM  Today's visit was initial clinical interview with the patient. The patient was present without other family members. This was a 1-1/2-hour in person clinical interview that was conducted in my outpatient clinic office with myself and the patient present.  Provider/Observer:  Ilean Skill, Psy.D.       Clinical Neuropsychologist       Billing Code/Service: 95093  Chief Complaint:    Elizabeth Mccullough is an 79 year old female referred by her primary care provider [name], DO for neuropsychological consultation and therapeutic interventions. The patient is also been followed by Star Age, MD for neurological consultation and care. The patient has a past medical history of Parkinson's disease, chronic congestive heart failure, carotid artery disease, AAA, hyperlipidemia. The patient has primarily right-sided predominant Parkinson's disease. The patient had been followed by Dr. Carles Collet for several years prior due to her movement disorder. The patient has been experiencing increasing symptoms of depression and anxiety in conjunction with feeling more shaky and tremulous. The patient has had various medication changes and has recently been started on Zoloft by her PCP. The patient also describes some development of memory loss with much of her memory difficulties related to retrieval deficits. The patient is able to remember new information but has difficulty retrieving information once it is stored.  Reason for Service:  Elizabeth Mccullough is an 79 year old female referred by her primary care  provider [name], DO for neuropsychological consultation and therapeutic interventions. The patient is also been followed by Star Age, MD for neurological consultation and care. The patient has a past medical history of Parkinson's disease, chronic congestive heart failure, carotid artery disease, AAA, hyperlipidemia. The patient has primarily right-sided predominant Parkinson's disease. The patient had been followed by Dr. Carles Collet for several years prior due to her movement disorder. The patient has been experiencing increasing symptoms of depression and anxiety in conjunction with feeling more shaky and tremulous. The patient has had various medication changes and has recently been started on Zoloft by her PCP. The patient also describes some development of memory loss with much of her memory difficulties related to retrieval deficits. The patient is able to remember new information but has difficulty retrieving information once it is stored.  The patient was diagnosed with Parkinson's disease several years ago and has continued to take Sinemet and was also taking Effexor. The Effexor was recently changed to Zoloft. The patient reports that she has had multiple falls including presentation to the emergency department following 1 recent fall. The patient is described as having a loss of wanting to go out and socialize and do various activities and is very anxious much of the time. She describes significant sleep variability. She reports that some nights she will sleep well but other nights she is up all night and may be up as much as 2 nights in a row. She describes her appetite is good. The patient reports that she is using an automatic medication dispenser to keep up with her medications.  The patient is currently living at [facility] which is an independent living facility and has been living there for the prior 6 months. The patient's husband passed away 8 years ago. The patient lives alone for a  period of time after her husband passed away and then lived with her daughter before moving into [place]. The patient describes her living situation is quite nice but she does miss some of her level of independence.  Behavioral Observation: Elizabeth Mccullough  presents as a 79 y.o.-year-old Right Caucasian Female who appeared her stated age. her dress was Appropriate and she was Well Groomed and her manners were Appropriate to the situation.  her participation was indicative of Appropriate and Redirectable behaviors.  There were  physical disabilities noted significant tremors in her right hand and difficulties with gait.  she displayed an appropriate level of cooperation and motivation.     Interactions:    Active Appropriate and Redirectable  Attention:   abnormal and attention span appeared shorter than expected for age  Memory:   abnormal; remote memory intact, recent memory impaired primarily related to retrieval issues.  Visuo-spatial:  not examined  Speech (Volume):  low  Speech:   normal; some stammering to her speech and some mild word finding issues.  Thought Process:  Coherent and Relevant  Though Content:  WNL; not suicidal and not homicidal  Orientation:   person, place, time/date and situation  Judgment:   Fair  Planning:   Fair  Affect:    Anxious and Lethargic  Mood:    Anxious and Dysphoric  Insight:   Good  Intelligence:   normal  Marital Status/Living: The patient was born and raised in [place] and lived there for 25 years until she and her husband moved to [place] after her husband took a job as a Freight forwarder at a [employer] product[employer]process [employer]. The patient was 1 of 9 children and the only significant childhood illnesses were measles and chickenpox. The patient was married for 56 years and her husband passed away 8 years ago. The patient has 4 adult children. The patient has a 79 year old daughter, 73 year old son, 10 year old daughter and a 19 year old  daughter. No significant issues are noted with her children.  Current Employment: The patient is retired.  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 She worked there for many years. 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 Hobbies and interests have included playing tennis, mah-jongg and various card games.  Substance Use:  No concerns of substance abuse are reported.    Education:   HS Graduate  Medical History:   Past Medical History:  Diagnosis Date  . AAA (abdominal aortic aneurysm) ([place])   . Carotid artery disease ([place]) 01/12/2017   Carotid US 6/18: Bilat < 50%; repeat in 12/2000  . Chronic combined systolic and diastolic CHF (congestive heart failure) ([place]) 03/01/2017   NICM // [place] 8/18: normal coronary arteries /  Echo 8/18: EF 20-25, mod AI, mod MR, PASP 32 // Echo 1/19: EF 55-60, no RWMA, Gr 1 DD, mod AI, mild MR, PASP 32  . Hyperlipidemia   . Parkinson's disease [facility])     Psychiatric History:  No prior psychiatric history as noted beyond issues associated with the more recent development of anxiety and depression with her loss of function and difficulties related to Parkinson's disease. During the clinical interview there were no indications of memory deficits or other symptoms consistent with Alzheimer's type dementia  or other progressive dementias and the types of memory issues she is having primarily related to retrieval deficits are consistent with cognitive symptoms associated with Parkinson's disease.  Family Med/Psych History:  Family History  Problem Relation Age of Onset  . Heart disease Mother   . Hyperlipidemia Sister   . Hyperlipidemia Brother   . Diabetes Sister   . Hyperlipidemia Sister   . Cancer Sister 25       colon  . Cancer Sister        breast  . Stroke Brother   . Hypertension Brother   . Breast cancer Other   . Colon cancer Other   . Prostate cancer  Son   . Diabetes Daughter   . Stomach cancer Daughter   . Healthy Daughter     Impression/DX:  Elizabeth Mccullough is an 79 year old female referred by her primary care provider [name], DO for neuropsychological consultation and therapeutic interventions. The patient is also been followed by Star Age, MD for neurological consultation and care. The patient has a past medical history of Parkinson's disease, chronic congestive heart failure, carotid artery disease, AAA, hyperlipidemia. The patient has primarily right-sided predominant Parkinson's disease. The patient had been followed by Dr. Carles Collet for several years prior due to her movement disorder. The patient has been experiencing increasing symptoms of depression and anxiety in conjunction with feeling more shaky and tremulous. The patient has had various medication changes and has recently been started on Zoloft by her PCP. The patient also describes some development of memory loss with much of her memory difficulties related to retrieval deficits. The patient is able to remember new information but has difficulty retrieving information once it is stored.  During the clinical interview, there did not appear to be symptoms consistent with other types of progressive degenerative dementia such as Alzheimer's or Lewy body or other conditions. The memory issues that were noted today appear to be primarily related to retrieval of information. The patient displayed good expressive and receptive language although she did have issues with some word finding difficulties and some fluency issues. The patient acknowledged significant symptoms of anxiety and depression and difficulties coping with her loss of function and concern around issues related to her Parkinson's disease.  Disposition/Plan:  We have set the patient up for therapeutic interventions around her anxiety and depression. We will initially schedule III visits approximately 2 weeks apart and  will try to get them scheduled as soon as possible although scheduling has been a little bit of an issue.  Diagnosis:    Parkinson disease ([place])  Anxiety and depression  Memory deficit         Electronically Signed   _______________________ Ilean Skill, Psy.D.

## 2020-02-27 DIAGNOSIS — M25561 Pain in right knee: Secondary | ICD-10-CM | POA: Diagnosis not present

## 2020-02-27 DIAGNOSIS — M6281 Muscle weakness (generalized): Secondary | ICD-10-CM | POA: Diagnosis not present

## 2020-02-27 DIAGNOSIS — R296 Repeated falls: Secondary | ICD-10-CM | POA: Diagnosis not present

## 2020-02-27 DIAGNOSIS — R2681 Unsteadiness on feet: Secondary | ICD-10-CM | POA: Diagnosis not present

## 2020-02-27 DIAGNOSIS — M25562 Pain in left knee: Secondary | ICD-10-CM | POA: Diagnosis not present

## 2020-02-27 DIAGNOSIS — R498 Other voice and resonance disorders: Secondary | ICD-10-CM | POA: Diagnosis not present

## 2020-02-27 DIAGNOSIS — G20 Parkinson's disease: Secondary | ICD-10-CM | POA: Diagnosis not present

## 2020-02-27 DIAGNOSIS — R41841 Cognitive communication deficit: Secondary | ICD-10-CM | POA: Diagnosis not present

## 2020-02-28 DIAGNOSIS — R296 Repeated falls: Secondary | ICD-10-CM | POA: Diagnosis not present

## 2020-02-28 DIAGNOSIS — G20 Parkinson's disease: Secondary | ICD-10-CM | POA: Diagnosis not present

## 2020-02-28 DIAGNOSIS — M6281 Muscle weakness (generalized): Secondary | ICD-10-CM | POA: Diagnosis not present

## 2020-03-03 DIAGNOSIS — G20 Parkinson's disease: Secondary | ICD-10-CM | POA: Diagnosis not present

## 2020-03-03 DIAGNOSIS — M6281 Muscle weakness (generalized): Secondary | ICD-10-CM | POA: Diagnosis not present

## 2020-03-03 DIAGNOSIS — R296 Repeated falls: Secondary | ICD-10-CM | POA: Diagnosis not present

## 2020-03-04 DIAGNOSIS — M25561 Pain in right knee: Secondary | ICD-10-CM | POA: Diagnosis not present

## 2020-03-04 DIAGNOSIS — R296 Repeated falls: Secondary | ICD-10-CM | POA: Diagnosis not present

## 2020-03-04 DIAGNOSIS — R2681 Unsteadiness on feet: Secondary | ICD-10-CM | POA: Diagnosis not present

## 2020-03-04 DIAGNOSIS — M25562 Pain in left knee: Secondary | ICD-10-CM | POA: Diagnosis not present

## 2020-03-04 DIAGNOSIS — M6281 Muscle weakness (generalized): Secondary | ICD-10-CM | POA: Diagnosis not present

## 2020-03-04 DIAGNOSIS — G20 Parkinson's disease: Secondary | ICD-10-CM | POA: Diagnosis not present

## 2020-03-05 DIAGNOSIS — R296 Repeated falls: Secondary | ICD-10-CM | POA: Diagnosis not present

## 2020-03-05 DIAGNOSIS — G20 Parkinson's disease: Secondary | ICD-10-CM | POA: Diagnosis not present

## 2020-03-05 DIAGNOSIS — M6281 Muscle weakness (generalized): Secondary | ICD-10-CM | POA: Diagnosis not present

## 2020-03-06 DIAGNOSIS — R296 Repeated falls: Secondary | ICD-10-CM | POA: Diagnosis not present

## 2020-03-06 DIAGNOSIS — M6281 Muscle weakness (generalized): Secondary | ICD-10-CM | POA: Diagnosis not present

## 2020-03-06 DIAGNOSIS — R498 Other voice and resonance disorders: Secondary | ICD-10-CM | POA: Diagnosis not present

## 2020-03-06 DIAGNOSIS — R41841 Cognitive communication deficit: Secondary | ICD-10-CM | POA: Diagnosis not present

## 2020-03-06 DIAGNOSIS — G20 Parkinson's disease: Secondary | ICD-10-CM | POA: Diagnosis not present

## 2020-03-07 DIAGNOSIS — M6281 Muscle weakness (generalized): Secondary | ICD-10-CM | POA: Diagnosis not present

## 2020-03-07 DIAGNOSIS — M25561 Pain in right knee: Secondary | ICD-10-CM | POA: Diagnosis not present

## 2020-03-07 DIAGNOSIS — R2681 Unsteadiness on feet: Secondary | ICD-10-CM | POA: Diagnosis not present

## 2020-03-07 DIAGNOSIS — R41841 Cognitive communication deficit: Secondary | ICD-10-CM | POA: Diagnosis not present

## 2020-03-07 DIAGNOSIS — R498 Other voice and resonance disorders: Secondary | ICD-10-CM | POA: Diagnosis not present

## 2020-03-07 DIAGNOSIS — M25562 Pain in left knee: Secondary | ICD-10-CM | POA: Diagnosis not present

## 2020-03-10 ENCOUNTER — Other Ambulatory Visit: Payer: Self-pay | Admitting: Family Medicine

## 2020-03-10 DIAGNOSIS — G20 Parkinson's disease: Secondary | ICD-10-CM | POA: Diagnosis not present

## 2020-03-10 DIAGNOSIS — M25561 Pain in right knee: Secondary | ICD-10-CM | POA: Diagnosis not present

## 2020-03-10 DIAGNOSIS — E785 Hyperlipidemia, unspecified: Secondary | ICD-10-CM

## 2020-03-10 DIAGNOSIS — R296 Repeated falls: Secondary | ICD-10-CM | POA: Diagnosis not present

## 2020-03-10 DIAGNOSIS — M25562 Pain in left knee: Secondary | ICD-10-CM | POA: Diagnosis not present

## 2020-03-10 DIAGNOSIS — R41841 Cognitive communication deficit: Secondary | ICD-10-CM | POA: Diagnosis not present

## 2020-03-10 DIAGNOSIS — R2681 Unsteadiness on feet: Secondary | ICD-10-CM | POA: Diagnosis not present

## 2020-03-10 DIAGNOSIS — R498 Other voice and resonance disorders: Secondary | ICD-10-CM | POA: Diagnosis not present

## 2020-03-10 DIAGNOSIS — M6281 Muscle weakness (generalized): Secondary | ICD-10-CM | POA: Diagnosis not present

## 2020-03-11 ENCOUNTER — Other Ambulatory Visit: Payer: Self-pay | Admitting: Family Medicine

## 2020-03-11 DIAGNOSIS — M25562 Pain in left knee: Secondary | ICD-10-CM | POA: Diagnosis not present

## 2020-03-11 DIAGNOSIS — M6281 Muscle weakness (generalized): Secondary | ICD-10-CM | POA: Diagnosis not present

## 2020-03-11 DIAGNOSIS — E785 Hyperlipidemia, unspecified: Secondary | ICD-10-CM

## 2020-03-11 DIAGNOSIS — M25561 Pain in right knee: Secondary | ICD-10-CM | POA: Diagnosis not present

## 2020-03-11 DIAGNOSIS — R2681 Unsteadiness on feet: Secondary | ICD-10-CM | POA: Diagnosis not present

## 2020-03-12 DIAGNOSIS — G20 Parkinson's disease: Secondary | ICD-10-CM | POA: Diagnosis not present

## 2020-03-12 DIAGNOSIS — R2681 Unsteadiness on feet: Secondary | ICD-10-CM | POA: Diagnosis not present

## 2020-03-12 DIAGNOSIS — R296 Repeated falls: Secondary | ICD-10-CM | POA: Diagnosis not present

## 2020-03-12 DIAGNOSIS — M6281 Muscle weakness (generalized): Secondary | ICD-10-CM | POA: Diagnosis not present

## 2020-03-12 DIAGNOSIS — M25561 Pain in right knee: Secondary | ICD-10-CM | POA: Diagnosis not present

## 2020-03-12 DIAGNOSIS — M25562 Pain in left knee: Secondary | ICD-10-CM | POA: Diagnosis not present

## 2020-03-13 DIAGNOSIS — R296 Repeated falls: Secondary | ICD-10-CM | POA: Diagnosis not present

## 2020-03-13 DIAGNOSIS — M6281 Muscle weakness (generalized): Secondary | ICD-10-CM | POA: Diagnosis not present

## 2020-03-13 DIAGNOSIS — G20 Parkinson's disease: Secondary | ICD-10-CM | POA: Diagnosis not present

## 2020-03-14 DIAGNOSIS — R296 Repeated falls: Secondary | ICD-10-CM | POA: Diagnosis not present

## 2020-03-14 DIAGNOSIS — M6281 Muscle weakness (generalized): Secondary | ICD-10-CM | POA: Diagnosis not present

## 2020-03-14 DIAGNOSIS — R41841 Cognitive communication deficit: Secondary | ICD-10-CM | POA: Diagnosis not present

## 2020-03-14 DIAGNOSIS — R498 Other voice and resonance disorders: Secondary | ICD-10-CM | POA: Diagnosis not present

## 2020-03-14 DIAGNOSIS — G20 Parkinson's disease: Secondary | ICD-10-CM | POA: Diagnosis not present

## 2020-03-17 DIAGNOSIS — R41841 Cognitive communication deficit: Secondary | ICD-10-CM | POA: Diagnosis not present

## 2020-03-17 DIAGNOSIS — R498 Other voice and resonance disorders: Secondary | ICD-10-CM | POA: Diagnosis not present

## 2020-03-18 DIAGNOSIS — R296 Repeated falls: Secondary | ICD-10-CM | POA: Diagnosis not present

## 2020-03-18 DIAGNOSIS — G20 Parkinson's disease: Secondary | ICD-10-CM | POA: Diagnosis not present

## 2020-03-18 DIAGNOSIS — R2681 Unsteadiness on feet: Secondary | ICD-10-CM | POA: Diagnosis not present

## 2020-03-18 DIAGNOSIS — M25561 Pain in right knee: Secondary | ICD-10-CM | POA: Diagnosis not present

## 2020-03-18 DIAGNOSIS — M25562 Pain in left knee: Secondary | ICD-10-CM | POA: Diagnosis not present

## 2020-03-18 DIAGNOSIS — M6281 Muscle weakness (generalized): Secondary | ICD-10-CM | POA: Diagnosis not present

## 2020-03-19 DIAGNOSIS — G20 Parkinson's disease: Secondary | ICD-10-CM | POA: Diagnosis not present

## 2020-03-19 DIAGNOSIS — M6281 Muscle weakness (generalized): Secondary | ICD-10-CM | POA: Diagnosis not present

## 2020-03-19 DIAGNOSIS — R296 Repeated falls: Secondary | ICD-10-CM | POA: Diagnosis not present

## 2020-03-20 DIAGNOSIS — G20 Parkinson's disease: Secondary | ICD-10-CM | POA: Diagnosis not present

## 2020-03-20 DIAGNOSIS — R498 Other voice and resonance disorders: Secondary | ICD-10-CM | POA: Diagnosis not present

## 2020-03-20 DIAGNOSIS — R41841 Cognitive communication deficit: Secondary | ICD-10-CM | POA: Diagnosis not present

## 2020-03-20 DIAGNOSIS — R296 Repeated falls: Secondary | ICD-10-CM | POA: Diagnosis not present

## 2020-03-20 DIAGNOSIS — M6281 Muscle weakness (generalized): Secondary | ICD-10-CM | POA: Diagnosis not present

## 2020-03-21 DIAGNOSIS — R296 Repeated falls: Secondary | ICD-10-CM | POA: Diagnosis not present

## 2020-03-21 DIAGNOSIS — G20 Parkinson's disease: Secondary | ICD-10-CM | POA: Diagnosis not present

## 2020-03-21 DIAGNOSIS — M6281 Muscle weakness (generalized): Secondary | ICD-10-CM | POA: Diagnosis not present

## 2020-03-25 ENCOUNTER — Other Ambulatory Visit: Payer: Self-pay | Admitting: Family Medicine

## 2020-03-25 DIAGNOSIS — M6281 Muscle weakness (generalized): Secondary | ICD-10-CM | POA: Diagnosis not present

## 2020-03-25 DIAGNOSIS — R296 Repeated falls: Secondary | ICD-10-CM | POA: Diagnosis not present

## 2020-03-25 DIAGNOSIS — G20 Parkinson's disease: Secondary | ICD-10-CM | POA: Diagnosis not present

## 2020-03-26 DIAGNOSIS — R296 Repeated falls: Secondary | ICD-10-CM | POA: Diagnosis not present

## 2020-03-26 DIAGNOSIS — M6281 Muscle weakness (generalized): Secondary | ICD-10-CM | POA: Diagnosis not present

## 2020-03-26 DIAGNOSIS — R2681 Unsteadiness on feet: Secondary | ICD-10-CM | POA: Diagnosis not present

## 2020-03-26 DIAGNOSIS — M25562 Pain in left knee: Secondary | ICD-10-CM | POA: Diagnosis not present

## 2020-03-26 DIAGNOSIS — G20 Parkinson's disease: Secondary | ICD-10-CM | POA: Diagnosis not present

## 2020-03-26 DIAGNOSIS — M25561 Pain in right knee: Secondary | ICD-10-CM | POA: Diagnosis not present

## 2020-03-27 DIAGNOSIS — H43813 Vitreous degeneration, bilateral: Secondary | ICD-10-CM | POA: Diagnosis not present

## 2020-03-27 DIAGNOSIS — S0511XA Contusion of eyeball and orbital tissues, right eye, initial encounter: Secondary | ICD-10-CM | POA: Diagnosis not present

## 2020-03-27 DIAGNOSIS — H25813 Combined forms of age-related cataract, bilateral: Secondary | ICD-10-CM | POA: Diagnosis not present

## 2020-03-27 DIAGNOSIS — H16213 Exposure keratoconjunctivitis, bilateral: Secondary | ICD-10-CM | POA: Diagnosis not present

## 2020-03-28 DIAGNOSIS — R2681 Unsteadiness on feet: Secondary | ICD-10-CM | POA: Diagnosis not present

## 2020-03-28 DIAGNOSIS — M25562 Pain in left knee: Secondary | ICD-10-CM | POA: Diagnosis not present

## 2020-03-28 DIAGNOSIS — M6281 Muscle weakness (generalized): Secondary | ICD-10-CM | POA: Diagnosis not present

## 2020-03-28 DIAGNOSIS — G20 Parkinson's disease: Secondary | ICD-10-CM | POA: Diagnosis not present

## 2020-03-28 DIAGNOSIS — R296 Repeated falls: Secondary | ICD-10-CM | POA: Diagnosis not present

## 2020-03-28 DIAGNOSIS — M25561 Pain in right knee: Secondary | ICD-10-CM | POA: Diagnosis not present

## 2020-03-31 DIAGNOSIS — G20 Parkinson's disease: Secondary | ICD-10-CM | POA: Diagnosis not present

## 2020-03-31 DIAGNOSIS — R296 Repeated falls: Secondary | ICD-10-CM | POA: Diagnosis not present

## 2020-03-31 DIAGNOSIS — M6281 Muscle weakness (generalized): Secondary | ICD-10-CM | POA: Diagnosis not present

## 2020-04-01 DIAGNOSIS — R2681 Unsteadiness on feet: Secondary | ICD-10-CM | POA: Diagnosis not present

## 2020-04-01 DIAGNOSIS — M6281 Muscle weakness (generalized): Secondary | ICD-10-CM | POA: Diagnosis not present

## 2020-04-01 DIAGNOSIS — M25562 Pain in left knee: Secondary | ICD-10-CM | POA: Diagnosis not present

## 2020-04-01 DIAGNOSIS — M25561 Pain in right knee: Secondary | ICD-10-CM | POA: Diagnosis not present

## 2020-04-02 DIAGNOSIS — R296 Repeated falls: Secondary | ICD-10-CM | POA: Diagnosis not present

## 2020-04-02 DIAGNOSIS — R2681 Unsteadiness on feet: Secondary | ICD-10-CM | POA: Diagnosis not present

## 2020-04-02 DIAGNOSIS — M25562 Pain in left knee: Secondary | ICD-10-CM | POA: Diagnosis not present

## 2020-04-02 DIAGNOSIS — M25561 Pain in right knee: Secondary | ICD-10-CM | POA: Diagnosis not present

## 2020-04-02 DIAGNOSIS — G20 Parkinson's disease: Secondary | ICD-10-CM | POA: Diagnosis not present

## 2020-04-02 DIAGNOSIS — M6281 Muscle weakness (generalized): Secondary | ICD-10-CM | POA: Diagnosis not present

## 2020-04-03 DIAGNOSIS — R296 Repeated falls: Secondary | ICD-10-CM | POA: Diagnosis not present

## 2020-04-03 DIAGNOSIS — R2681 Unsteadiness on feet: Secondary | ICD-10-CM | POA: Diagnosis not present

## 2020-04-03 DIAGNOSIS — M6281 Muscle weakness (generalized): Secondary | ICD-10-CM | POA: Diagnosis not present

## 2020-04-03 DIAGNOSIS — M25562 Pain in left knee: Secondary | ICD-10-CM | POA: Diagnosis not present

## 2020-04-03 DIAGNOSIS — R41841 Cognitive communication deficit: Secondary | ICD-10-CM | POA: Diagnosis not present

## 2020-04-03 DIAGNOSIS — G20 Parkinson's disease: Secondary | ICD-10-CM | POA: Diagnosis not present

## 2020-04-03 DIAGNOSIS — M25561 Pain in right knee: Secondary | ICD-10-CM | POA: Diagnosis not present

## 2020-04-03 DIAGNOSIS — R498 Other voice and resonance disorders: Secondary | ICD-10-CM | POA: Diagnosis not present

## 2020-04-04 DIAGNOSIS — R498 Other voice and resonance disorders: Secondary | ICD-10-CM | POA: Diagnosis not present

## 2020-04-04 DIAGNOSIS — R41841 Cognitive communication deficit: Secondary | ICD-10-CM | POA: Diagnosis not present

## 2020-04-04 DIAGNOSIS — M6281 Muscle weakness (generalized): Secondary | ICD-10-CM | POA: Diagnosis not present

## 2020-04-04 DIAGNOSIS — R296 Repeated falls: Secondary | ICD-10-CM | POA: Diagnosis not present

## 2020-04-04 DIAGNOSIS — G20 Parkinson's disease: Secondary | ICD-10-CM | POA: Diagnosis not present

## 2020-04-05 ENCOUNTER — Other Ambulatory Visit: Payer: Self-pay | Admitting: Family Medicine

## 2020-04-05 DIAGNOSIS — E785 Hyperlipidemia, unspecified: Secondary | ICD-10-CM

## 2020-04-07 DIAGNOSIS — M25561 Pain in right knee: Secondary | ICD-10-CM | POA: Diagnosis not present

## 2020-04-07 DIAGNOSIS — R2681 Unsteadiness on feet: Secondary | ICD-10-CM | POA: Diagnosis not present

## 2020-04-07 DIAGNOSIS — M6281 Muscle weakness (generalized): Secondary | ICD-10-CM | POA: Diagnosis not present

## 2020-04-07 DIAGNOSIS — M25562 Pain in left knee: Secondary | ICD-10-CM | POA: Diagnosis not present

## 2020-04-08 DIAGNOSIS — G20 Parkinson's disease: Secondary | ICD-10-CM | POA: Diagnosis not present

## 2020-04-08 DIAGNOSIS — R296 Repeated falls: Secondary | ICD-10-CM | POA: Diagnosis not present

## 2020-04-08 DIAGNOSIS — M6281 Muscle weakness (generalized): Secondary | ICD-10-CM | POA: Diagnosis not present

## 2020-04-09 DIAGNOSIS — M25562 Pain in left knee: Secondary | ICD-10-CM | POA: Diagnosis not present

## 2020-04-09 DIAGNOSIS — M6281 Muscle weakness (generalized): Secondary | ICD-10-CM | POA: Diagnosis not present

## 2020-04-09 DIAGNOSIS — R498 Other voice and resonance disorders: Secondary | ICD-10-CM | POA: Diagnosis not present

## 2020-04-09 DIAGNOSIS — M25561 Pain in right knee: Secondary | ICD-10-CM | POA: Diagnosis not present

## 2020-04-09 DIAGNOSIS — R41841 Cognitive communication deficit: Secondary | ICD-10-CM | POA: Diagnosis not present

## 2020-04-09 DIAGNOSIS — R2681 Unsteadiness on feet: Secondary | ICD-10-CM | POA: Diagnosis not present

## 2020-04-10 ENCOUNTER — Ambulatory Visit: Payer: Medicare Other | Admitting: Family Medicine

## 2020-04-10 DIAGNOSIS — M25562 Pain in left knee: Secondary | ICD-10-CM | POA: Diagnosis not present

## 2020-04-10 DIAGNOSIS — M6281 Muscle weakness (generalized): Secondary | ICD-10-CM | POA: Diagnosis not present

## 2020-04-10 DIAGNOSIS — R296 Repeated falls: Secondary | ICD-10-CM | POA: Diagnosis not present

## 2020-04-10 DIAGNOSIS — M25561 Pain in right knee: Secondary | ICD-10-CM | POA: Diagnosis not present

## 2020-04-10 DIAGNOSIS — R2681 Unsteadiness on feet: Secondary | ICD-10-CM | POA: Diagnosis not present

## 2020-04-10 DIAGNOSIS — G20 Parkinson's disease: Secondary | ICD-10-CM | POA: Diagnosis not present

## 2020-04-11 DIAGNOSIS — G20 Parkinson's disease: Secondary | ICD-10-CM | POA: Diagnosis not present

## 2020-04-11 DIAGNOSIS — R498 Other voice and resonance disorders: Secondary | ICD-10-CM | POA: Diagnosis not present

## 2020-04-11 DIAGNOSIS — R296 Repeated falls: Secondary | ICD-10-CM | POA: Diagnosis not present

## 2020-04-11 DIAGNOSIS — M6281 Muscle weakness (generalized): Secondary | ICD-10-CM | POA: Diagnosis not present

## 2020-04-11 DIAGNOSIS — R41841 Cognitive communication deficit: Secondary | ICD-10-CM | POA: Diagnosis not present

## 2020-04-13 DIAGNOSIS — A499 Bacterial infection, unspecified: Secondary | ICD-10-CM | POA: Diagnosis not present

## 2020-04-13 DIAGNOSIS — R300 Dysuria: Secondary | ICD-10-CM | POA: Diagnosis not present

## 2020-04-13 DIAGNOSIS — Z9181 History of falling: Secondary | ICD-10-CM | POA: Diagnosis not present

## 2020-04-13 DIAGNOSIS — N390 Urinary tract infection, site not specified: Secondary | ICD-10-CM | POA: Diagnosis not present

## 2020-04-14 DIAGNOSIS — R41841 Cognitive communication deficit: Secondary | ICD-10-CM | POA: Diagnosis not present

## 2020-04-14 DIAGNOSIS — R296 Repeated falls: Secondary | ICD-10-CM | POA: Diagnosis not present

## 2020-04-14 DIAGNOSIS — R498 Other voice and resonance disorders: Secondary | ICD-10-CM | POA: Diagnosis not present

## 2020-04-14 DIAGNOSIS — M6281 Muscle weakness (generalized): Secondary | ICD-10-CM | POA: Diagnosis not present

## 2020-04-14 DIAGNOSIS — G20 Parkinson's disease: Secondary | ICD-10-CM | POA: Diagnosis not present

## 2020-04-15 DIAGNOSIS — M25561 Pain in right knee: Secondary | ICD-10-CM | POA: Diagnosis not present

## 2020-04-15 DIAGNOSIS — R2681 Unsteadiness on feet: Secondary | ICD-10-CM | POA: Diagnosis not present

## 2020-04-15 DIAGNOSIS — M6281 Muscle weakness (generalized): Secondary | ICD-10-CM | POA: Diagnosis not present

## 2020-04-15 DIAGNOSIS — M25562 Pain in left knee: Secondary | ICD-10-CM | POA: Diagnosis not present

## 2020-04-16 DIAGNOSIS — G20 Parkinson's disease: Secondary | ICD-10-CM | POA: Diagnosis not present

## 2020-04-16 DIAGNOSIS — M6281 Muscle weakness (generalized): Secondary | ICD-10-CM | POA: Diagnosis not present

## 2020-04-16 DIAGNOSIS — R296 Repeated falls: Secondary | ICD-10-CM | POA: Diagnosis not present

## 2020-04-17 DIAGNOSIS — R41841 Cognitive communication deficit: Secondary | ICD-10-CM | POA: Diagnosis not present

## 2020-04-17 DIAGNOSIS — R498 Other voice and resonance disorders: Secondary | ICD-10-CM | POA: Diagnosis not present

## 2020-04-18 DIAGNOSIS — M6281 Muscle weakness (generalized): Secondary | ICD-10-CM | POA: Diagnosis not present

## 2020-04-18 DIAGNOSIS — R296 Repeated falls: Secondary | ICD-10-CM | POA: Diagnosis not present

## 2020-04-18 DIAGNOSIS — M25562 Pain in left knee: Secondary | ICD-10-CM | POA: Diagnosis not present

## 2020-04-18 DIAGNOSIS — M25561 Pain in right knee: Secondary | ICD-10-CM | POA: Diagnosis not present

## 2020-04-18 DIAGNOSIS — G20 Parkinson's disease: Secondary | ICD-10-CM | POA: Diagnosis not present

## 2020-04-18 DIAGNOSIS — R2681 Unsteadiness on feet: Secondary | ICD-10-CM | POA: Diagnosis not present

## 2020-04-21 DIAGNOSIS — G20 Parkinson's disease: Secondary | ICD-10-CM | POA: Diagnosis not present

## 2020-04-21 DIAGNOSIS — M6281 Muscle weakness (generalized): Secondary | ICD-10-CM | POA: Diagnosis not present

## 2020-04-21 DIAGNOSIS — R296 Repeated falls: Secondary | ICD-10-CM | POA: Diagnosis not present

## 2020-04-22 DIAGNOSIS — R41841 Cognitive communication deficit: Secondary | ICD-10-CM | POA: Diagnosis not present

## 2020-04-22 DIAGNOSIS — M25561 Pain in right knee: Secondary | ICD-10-CM | POA: Diagnosis not present

## 2020-04-22 DIAGNOSIS — M6281 Muscle weakness (generalized): Secondary | ICD-10-CM | POA: Diagnosis not present

## 2020-04-22 DIAGNOSIS — R2681 Unsteadiness on feet: Secondary | ICD-10-CM | POA: Diagnosis not present

## 2020-04-22 DIAGNOSIS — R498 Other voice and resonance disorders: Secondary | ICD-10-CM | POA: Diagnosis not present

## 2020-04-22 DIAGNOSIS — M25562 Pain in left knee: Secondary | ICD-10-CM | POA: Diagnosis not present

## 2020-04-23 DIAGNOSIS — G20 Parkinson's disease: Secondary | ICD-10-CM | POA: Diagnosis not present

## 2020-04-23 DIAGNOSIS — M25562 Pain in left knee: Secondary | ICD-10-CM | POA: Diagnosis not present

## 2020-04-23 DIAGNOSIS — R2681 Unsteadiness on feet: Secondary | ICD-10-CM | POA: Diagnosis not present

## 2020-04-23 DIAGNOSIS — M6281 Muscle weakness (generalized): Secondary | ICD-10-CM | POA: Diagnosis not present

## 2020-04-23 DIAGNOSIS — M25561 Pain in right knee: Secondary | ICD-10-CM | POA: Diagnosis not present

## 2020-04-23 DIAGNOSIS — R296 Repeated falls: Secondary | ICD-10-CM | POA: Diagnosis not present

## 2020-04-24 DIAGNOSIS — R41841 Cognitive communication deficit: Secondary | ICD-10-CM | POA: Diagnosis not present

## 2020-04-24 DIAGNOSIS — R498 Other voice and resonance disorders: Secondary | ICD-10-CM | POA: Diagnosis not present

## 2020-04-25 DIAGNOSIS — G20 Parkinson's disease: Secondary | ICD-10-CM | POA: Diagnosis not present

## 2020-04-25 DIAGNOSIS — R296 Repeated falls: Secondary | ICD-10-CM | POA: Diagnosis not present

## 2020-04-25 DIAGNOSIS — M6281 Muscle weakness (generalized): Secondary | ICD-10-CM | POA: Diagnosis not present

## 2020-04-25 DIAGNOSIS — M25561 Pain in right knee: Secondary | ICD-10-CM | POA: Diagnosis not present

## 2020-04-25 DIAGNOSIS — M25562 Pain in left knee: Secondary | ICD-10-CM | POA: Diagnosis not present

## 2020-04-25 DIAGNOSIS — R2681 Unsteadiness on feet: Secondary | ICD-10-CM | POA: Diagnosis not present

## 2020-04-28 DIAGNOSIS — G20 Parkinson's disease: Secondary | ICD-10-CM | POA: Diagnosis not present

## 2020-04-28 DIAGNOSIS — R296 Repeated falls: Secondary | ICD-10-CM | POA: Diagnosis not present

## 2020-04-28 DIAGNOSIS — M6281 Muscle weakness (generalized): Secondary | ICD-10-CM | POA: Diagnosis not present

## 2020-04-29 DIAGNOSIS — R498 Other voice and resonance disorders: Secondary | ICD-10-CM | POA: Diagnosis not present

## 2020-04-29 DIAGNOSIS — R2681 Unsteadiness on feet: Secondary | ICD-10-CM | POA: Diagnosis not present

## 2020-04-29 DIAGNOSIS — R41841 Cognitive communication deficit: Secondary | ICD-10-CM | POA: Diagnosis not present

## 2020-04-29 DIAGNOSIS — M6281 Muscle weakness (generalized): Secondary | ICD-10-CM | POA: Diagnosis not present

## 2020-04-29 DIAGNOSIS — M25562 Pain in left knee: Secondary | ICD-10-CM | POA: Diagnosis not present

## 2020-04-29 DIAGNOSIS — M25561 Pain in right knee: Secondary | ICD-10-CM | POA: Diagnosis not present

## 2020-04-30 ENCOUNTER — Emergency Department (INDEPENDENT_AMBULATORY_CARE_PROVIDER_SITE_OTHER): Payer: Medicare Other

## 2020-04-30 ENCOUNTER — Emergency Department (INDEPENDENT_AMBULATORY_CARE_PROVIDER_SITE_OTHER)
Admission: RE | Admit: 2020-04-30 | Discharge: 2020-04-30 | Disposition: A | Discharge status: Home / Self Care | Payer: Medicare Other | Source: Ambulatory Visit | Attending: Family Medicine | Admitting: Family Medicine

## 2020-04-30 ENCOUNTER — Other Ambulatory Visit: Payer: Self-pay

## 2020-04-30 VITALS — BP 115/72 | HR 67 | Temp 97.5°F | Resp 17

## 2020-04-30 DIAGNOSIS — S7001XA Contusion of right hip, initial encounter: Secondary | ICD-10-CM | POA: Diagnosis not present

## 2020-04-30 DIAGNOSIS — I6782 Cerebral ischemia: Secondary | ICD-10-CM | POA: Diagnosis not present

## 2020-04-30 DIAGNOSIS — E041 Nontoxic single thyroid nodule: Secondary | ICD-10-CM

## 2020-04-30 DIAGNOSIS — S0083XA Contusion of other part of head, initial encounter: Secondary | ICD-10-CM

## 2020-04-30 DIAGNOSIS — S8001XA Contusion of right knee, initial encounter: Secondary | ICD-10-CM

## 2020-04-30 DIAGNOSIS — W010XXA Fall on same level from slipping, tripping and stumbling without subsequent striking against object, initial encounter: Secondary | ICD-10-CM | POA: Diagnosis not present

## 2020-04-30 DIAGNOSIS — S60221A Contusion of right hand, initial encounter: Secondary | ICD-10-CM

## 2020-04-30 DIAGNOSIS — R2681 Unsteadiness on feet: Secondary | ICD-10-CM | POA: Diagnosis not present

## 2020-04-30 DIAGNOSIS — M79641 Pain in right hand: Secondary | ICD-10-CM

## 2020-04-30 DIAGNOSIS — M25562 Pain in left knee: Secondary | ICD-10-CM | POA: Diagnosis not present

## 2020-04-30 DIAGNOSIS — M7989 Other specified soft tissue disorders: Secondary | ICD-10-CM | POA: Diagnosis not present

## 2020-04-30 DIAGNOSIS — S199XXA Unspecified injury of neck, initial encounter: Secondary | ICD-10-CM | POA: Diagnosis not present

## 2020-04-30 DIAGNOSIS — M25551 Pain in right hip: Secondary | ICD-10-CM | POA: Diagnosis not present

## 2020-04-30 DIAGNOSIS — M47812 Spondylosis without myelopathy or radiculopathy, cervical region: Secondary | ICD-10-CM | POA: Diagnosis not present

## 2020-04-30 DIAGNOSIS — S0990XA Unspecified injury of head, initial encounter: Secondary | ICD-10-CM | POA: Diagnosis not present

## 2020-04-30 DIAGNOSIS — M6281 Muscle weakness (generalized): Secondary | ICD-10-CM | POA: Diagnosis not present

## 2020-04-30 DIAGNOSIS — G20 Parkinson's disease: Secondary | ICD-10-CM | POA: Diagnosis not present

## 2020-04-30 DIAGNOSIS — S0081XA Abrasion of other part of head, initial encounter: Secondary | ICD-10-CM

## 2020-04-30 DIAGNOSIS — R296 Repeated falls: Secondary | ICD-10-CM | POA: Diagnosis not present

## 2020-04-30 DIAGNOSIS — S2231XA Fracture of one rib, right side, initial encounter for closed fracture: Secondary | ICD-10-CM

## 2020-04-30 DIAGNOSIS — M25561 Pain in right knee: Secondary | ICD-10-CM | POA: Diagnosis not present

## 2020-04-30 DIAGNOSIS — Z043 Encounter for examination and observation following other accident: Secondary | ICD-10-CM | POA: Diagnosis not present
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
## 2020-04-30 NOTE — ED Triage Notes (Addendum)
Pt c/o several injuries after fall last night around 11pm. Says she was at bathroom sink, not using walker when she suddenly felt dizzy and fell. Injury to RT shoulder, also RT knee pain and RT hand swelling. Laceration to forehead and bridge of nose. Pain 8/10 Also says she has been getting headaches daily at around lunchtime.

## 2020-04-30 NOTE — Discharge Instructions (Addendum)
Apply ice pack for to injured areas for 10 to 15 minutes, 3 to 4 times daily  Continue until pain and swelling decrease.  Wear ace wrap on right hand until swelling resolves.   Change dressing on forehead daily and apply Bacitracin ointment to wound.  Keep wound clean and dry.  Return for any signs of infection (or follow-up with family doctor):  Increasing redness, swelling, pain, heat, drainage, etc.   If symptoms become significantly worse during the night or over the weekend, proceed to the local emergency room.

## 2020-05-01 DIAGNOSIS — M25562 Pain in left knee: Secondary | ICD-10-CM | POA: Diagnosis not present

## 2020-05-01 DIAGNOSIS — M6281 Muscle weakness (generalized): Secondary | ICD-10-CM | POA: Diagnosis not present

## 2020-05-01 DIAGNOSIS — R498 Other voice and resonance disorders: Secondary | ICD-10-CM | POA: Diagnosis not present

## 2020-05-01 DIAGNOSIS — R2681 Unsteadiness on feet: Secondary | ICD-10-CM | POA: Diagnosis not present

## 2020-05-01 DIAGNOSIS — R41841 Cognitive communication deficit: Secondary | ICD-10-CM | POA: Diagnosis not present

## 2020-05-01 DIAGNOSIS — M25561 Pain in right knee: Secondary | ICD-10-CM | POA: Diagnosis not present

## 2020-05-02 DIAGNOSIS — G20 Parkinson's disease: Secondary | ICD-10-CM | POA: Diagnosis not present

## 2020-05-02 DIAGNOSIS — R296 Repeated falls: Secondary | ICD-10-CM | POA: Diagnosis not present

## 2020-05-02 DIAGNOSIS — M6281 Muscle weakness (generalized): Secondary | ICD-10-CM | POA: Diagnosis not present

## 2020-05-06 DIAGNOSIS — H268 Other specified cataract: Secondary | ICD-10-CM | POA: Diagnosis not present

## 2020-05-06 DIAGNOSIS — H2521 Age-related cataract, morgagnian type, right eye: Secondary | ICD-10-CM | POA: Diagnosis not present

## 2020-05-06 DIAGNOSIS — H2589 Other age-related cataract: Secondary | ICD-10-CM | POA: Diagnosis not present

## 2020-05-06 DIAGNOSIS — H25811 Combined forms of age-related cataract, right eye: Secondary | ICD-10-CM | POA: Diagnosis not present

## 2020-05-07 DIAGNOSIS — M6281 Muscle weakness (generalized): Secondary | ICD-10-CM | POA: Diagnosis not present

## 2020-05-07 DIAGNOSIS — G20 Parkinson's disease: Secondary | ICD-10-CM | POA: Diagnosis not present

## 2020-05-07 DIAGNOSIS — R296 Repeated falls: Secondary | ICD-10-CM | POA: Diagnosis not present

## 2020-05-08 DIAGNOSIS — R296 Repeated falls: Secondary | ICD-10-CM | POA: Diagnosis not present

## 2020-05-08 DIAGNOSIS — M25562 Pain in left knee: Secondary | ICD-10-CM | POA: Diagnosis not present

## 2020-05-08 DIAGNOSIS — M25561 Pain in right knee: Secondary | ICD-10-CM | POA: Diagnosis not present

## 2020-05-08 DIAGNOSIS — M6281 Muscle weakness (generalized): Secondary | ICD-10-CM | POA: Diagnosis not present

## 2020-05-08 DIAGNOSIS — G20 Parkinson's disease: Secondary | ICD-10-CM | POA: Diagnosis not present

## 2020-05-08 DIAGNOSIS — R2681 Unsteadiness on feet: Secondary | ICD-10-CM | POA: Diagnosis not present

## 2020-05-09 DIAGNOSIS — R2681 Unsteadiness on feet: Secondary | ICD-10-CM | POA: Diagnosis not present

## 2020-05-09 DIAGNOSIS — R296 Repeated falls: Secondary | ICD-10-CM | POA: Diagnosis not present

## 2020-05-09 DIAGNOSIS — M25562 Pain in left knee: Secondary | ICD-10-CM | POA: Diagnosis not present

## 2020-05-09 DIAGNOSIS — M25561 Pain in right knee: Secondary | ICD-10-CM | POA: Diagnosis not present

## 2020-05-09 DIAGNOSIS — G20 Parkinson's disease: Secondary | ICD-10-CM | POA: Diagnosis not present

## 2020-05-09 DIAGNOSIS — M6281 Muscle weakness (generalized): Secondary | ICD-10-CM | POA: Diagnosis not present

## 2020-05-12 DIAGNOSIS — R498 Other voice and resonance disorders: Secondary | ICD-10-CM | POA: Diagnosis not present

## 2020-05-12 DIAGNOSIS — M25561 Pain in right knee: Secondary | ICD-10-CM | POA: Diagnosis not present

## 2020-05-12 DIAGNOSIS — R2681 Unsteadiness on feet: Secondary | ICD-10-CM | POA: Diagnosis not present

## 2020-05-12 DIAGNOSIS — R296 Repeated falls: Secondary | ICD-10-CM | POA: Diagnosis not present

## 2020-05-12 DIAGNOSIS — R41841 Cognitive communication deficit: Secondary | ICD-10-CM | POA: Diagnosis not present

## 2020-05-12 DIAGNOSIS — M6281 Muscle weakness (generalized): Secondary | ICD-10-CM | POA: Diagnosis not present

## 2020-05-12 DIAGNOSIS — M25562 Pain in left knee: Secondary | ICD-10-CM | POA: Diagnosis not present

## 2020-05-12 DIAGNOSIS — G20 Parkinson's disease: Secondary | ICD-10-CM | POA: Diagnosis not present

## 2020-05-13 ENCOUNTER — Encounter (HOSPITAL_COMMUNITY): Payer: Self-pay | Admitting: Emergency Medicine

## 2020-05-13 ENCOUNTER — Other Ambulatory Visit: Payer: Self-pay

## 2020-05-13 ENCOUNTER — Emergency Department (HOSPITAL_COMMUNITY): Payer: Medicare Other

## 2020-05-13 ENCOUNTER — Observation Stay (HOSPITAL_COMMUNITY)
Admission: EM | Admit: 2020-05-13 | Discharge: 2020-05-17 | DRG: 562 | Disposition: A | Discharge status: Rehabilitation Facility | Payer: Medicare Other | Attending: Internal Medicine | Admitting: Internal Medicine

## 2020-05-13 DIAGNOSIS — E876 Hypokalemia: Secondary | ICD-10-CM | POA: Diagnosis present

## 2020-05-13 DIAGNOSIS — R079 Chest pain, unspecified: Secondary | ICD-10-CM | POA: Diagnosis not present

## 2020-05-13 DIAGNOSIS — I5043 Acute on chronic combined systolic (congestive) and diastolic (congestive) heart failure: Secondary | ICD-10-CM | POA: Diagnosis not present

## 2020-05-13 DIAGNOSIS — R498 Other voice and resonance disorders: Secondary | ICD-10-CM | POA: Diagnosis not present

## 2020-05-13 DIAGNOSIS — Z880 Allergy status to penicillin: Secondary | ICD-10-CM | POA: Diagnosis not present

## 2020-05-13 DIAGNOSIS — I712 Thoracic aortic aneurysm, without rupture: Secondary | ICD-10-CM | POA: Diagnosis present

## 2020-05-13 DIAGNOSIS — Z20822 Contact with and (suspected) exposure to covid-19: Secondary | ICD-10-CM | POA: Diagnosis not present

## 2020-05-13 DIAGNOSIS — E785 Hyperlipidemia, unspecified: Secondary | ICD-10-CM | POA: Diagnosis not present

## 2020-05-13 DIAGNOSIS — S199XXA Unspecified injury of neck, initial encounter: Secondary | ICD-10-CM | POA: Diagnosis not present

## 2020-05-13 DIAGNOSIS — F419 Anxiety disorder, unspecified: Secondary | ICD-10-CM | POA: Diagnosis not present

## 2020-05-13 DIAGNOSIS — R0781 Pleurodynia: Secondary | ICD-10-CM | POA: Diagnosis not present

## 2020-05-13 DIAGNOSIS — M1611 Unilateral primary osteoarthritis, right hip: Secondary | ICD-10-CM | POA: Diagnosis not present

## 2020-05-13 DIAGNOSIS — R42 Dizziness and giddiness: Secondary | ICD-10-CM

## 2020-05-13 DIAGNOSIS — G20 Parkinson's disease: Secondary | ICD-10-CM | POA: Diagnosis not present

## 2020-05-13 DIAGNOSIS — F32A Depression, unspecified: Secondary | ICD-10-CM | POA: Diagnosis present

## 2020-05-13 DIAGNOSIS — S42009A Fracture of unspecified part of unspecified clavicle, initial encounter for closed fracture: Secondary | ICD-10-CM | POA: Diagnosis present

## 2020-05-13 DIAGNOSIS — Z888 Allergy status to other drugs, medicaments and biological substances status: Secondary | ICD-10-CM | POA: Diagnosis not present

## 2020-05-13 DIAGNOSIS — S42032A Displaced fracture of lateral end of left clavicle, initial encounter for closed fracture: Principal | ICD-10-CM | POA: Diagnosis present

## 2020-05-13 DIAGNOSIS — M25562 Pain in left knee: Secondary | ICD-10-CM | POA: Diagnosis not present

## 2020-05-13 DIAGNOSIS — R0602 Shortness of breath: Secondary | ICD-10-CM | POA: Diagnosis not present

## 2020-05-13 DIAGNOSIS — S8001XA Contusion of right knee, initial encounter: Secondary | ICD-10-CM | POA: Diagnosis present

## 2020-05-13 DIAGNOSIS — I428 Other cardiomyopathies: Secondary | ICD-10-CM | POA: Diagnosis present

## 2020-05-13 DIAGNOSIS — R0789 Other chest pain: Secondary | ICD-10-CM | POA: Diagnosis not present

## 2020-05-13 DIAGNOSIS — R413 Other amnesia: Secondary | ICD-10-CM | POA: Diagnosis present

## 2020-05-13 DIAGNOSIS — D649 Anemia, unspecified: Secondary | ICD-10-CM | POA: Diagnosis not present

## 2020-05-13 DIAGNOSIS — R296 Repeated falls: Secondary | ICD-10-CM

## 2020-05-13 DIAGNOSIS — I951 Orthostatic hypotension: Secondary | ICD-10-CM | POA: Diagnosis not present

## 2020-05-13 DIAGNOSIS — M25551 Pain in right hip: Secondary | ICD-10-CM | POA: Diagnosis present

## 2020-05-13 DIAGNOSIS — Z823 Family history of stroke: Secondary | ICD-10-CM

## 2020-05-13 DIAGNOSIS — W010XXA Fall on same level from slipping, tripping and stumbling without subsequent striking against object, initial encounter: Secondary | ICD-10-CM | POA: Diagnosis not present

## 2020-05-13 DIAGNOSIS — Z800 Family history of malignant neoplasm of digestive organs: Secondary | ICD-10-CM

## 2020-05-13 DIAGNOSIS — R21 Rash and other nonspecific skin eruption: Secondary | ICD-10-CM | POA: Diagnosis not present

## 2020-05-13 DIAGNOSIS — I447 Left bundle-branch block, unspecified: Secondary | ICD-10-CM | POA: Diagnosis present

## 2020-05-13 DIAGNOSIS — M19012 Primary osteoarthritis, left shoulder: Secondary | ICD-10-CM | POA: Diagnosis not present

## 2020-05-13 DIAGNOSIS — R41841 Cognitive communication deficit: Secondary | ICD-10-CM | POA: Diagnosis not present

## 2020-05-13 DIAGNOSIS — W19XXXA Unspecified fall, initial encounter: Secondary | ICD-10-CM | POA: Diagnosis present

## 2020-05-13 DIAGNOSIS — I517 Cardiomegaly: Secondary | ICD-10-CM | POA: Diagnosis not present

## 2020-05-13 DIAGNOSIS — Z7982 Long term (current) use of aspirin: Secondary | ICD-10-CM

## 2020-05-13 DIAGNOSIS — J90 Pleural effusion, not elsewhere classified: Secondary | ICD-10-CM | POA: Diagnosis not present

## 2020-05-13 DIAGNOSIS — I110 Hypertensive heart disease with heart failure: Secondary | ICD-10-CM | POA: Diagnosis not present

## 2020-05-13 DIAGNOSIS — Z79899 Other long term (current) drug therapy: Secondary | ICD-10-CM

## 2020-05-13 DIAGNOSIS — S42002A Fracture of unspecified part of left clavicle, initial encounter for closed fracture: Secondary | ICD-10-CM | POA: Diagnosis not present

## 2020-05-13 DIAGNOSIS — Z83438 Family history of other disorder of lipoprotein metabolism and other lipidemia: Secondary | ICD-10-CM

## 2020-05-13 DIAGNOSIS — R2681 Unsteadiness on feet: Secondary | ICD-10-CM | POA: Diagnosis not present

## 2020-05-13 DIAGNOSIS — Z8249 Family history of ischemic heart disease and other diseases of the circulatory system: Secondary | ICD-10-CM

## 2020-05-13 DIAGNOSIS — I10 Essential (primary) hypertension: Secondary | ICD-10-CM | POA: Diagnosis not present

## 2020-05-13 DIAGNOSIS — I351 Nonrheumatic aortic (valve) insufficiency: Secondary | ICD-10-CM | POA: Diagnosis present

## 2020-05-13 DIAGNOSIS — M47816 Spondylosis without myelopathy or radiculopathy, lumbar region: Secondary | ICD-10-CM | POA: Diagnosis not present

## 2020-05-13 DIAGNOSIS — M6281 Muscle weakness (generalized): Secondary | ICD-10-CM | POA: Diagnosis not present

## 2020-05-13 DIAGNOSIS — M25561 Pain in right knee: Secondary | ICD-10-CM | POA: Diagnosis not present

## 2020-05-13 DIAGNOSIS — Z833 Family history of diabetes mellitus: Secondary | ICD-10-CM

## 2020-05-13 LAB — URINALYSIS, ROUTINE W REFLEX MICROSCOPIC
Bilirubin Urine: NEGATIVE
Glucose, UA: NEGATIVE mg/dL
Hgb urine dipstick: NEGATIVE
Ketones, ur: NEGATIVE mg/dL
Nitrite: NEGATIVE
Protein, ur: NEGATIVE mg/dL
Specific Gravity, Urine: 1.009 (ref 1.005–1.030)
pH: 6 (ref 5.0–8.0)

## 2020-05-13 LAB — CBC WITH DIFFERENTIAL/PLATELET
Abs Immature Granulocytes: 0.03 10*3/uL (ref 0.00–0.07)
Basophils Absolute: 0.1 10*3/uL (ref 0.0–0.1)
Basophils Relative: 1 %
Eosinophils Absolute: 0.1 10*3/uL (ref 0.0–0.5)
Eosinophils Relative: 1 %
HCT: 33.6 % — ABNORMAL LOW (ref 36.0–46.0)
Hemoglobin: 10.6 g/dL — ABNORMAL LOW (ref 12.0–15.0)
Immature Granulocytes: 0 %
Lymphocytes Relative: 20 %
Lymphs Abs: 1.6 10*3/uL (ref 0.7–4.0)
MCH: 31.4 pg (ref 26.0–34.0)
MCHC: 31.5 g/dL (ref 30.0–36.0)
MCV: 99.4 fL (ref 80.0–100.0)
Monocytes Absolute: 0.7 10*3/uL (ref 0.1–1.0)
Monocytes Relative: 9 %
Neutro Abs: 5.5 10*3/uL (ref 1.7–7.7)
Neutrophils Relative %: 69 %
Platelets: 329 10*3/uL (ref 150–400)
RBC: 3.38 MIL/uL — ABNORMAL LOW (ref 3.87–5.11)
RDW: 12.9 % (ref 11.5–15.5)
WBC: 7.9 10*3/uL (ref 4.0–10.5)
nRBC: 0 % (ref 0.0–0.2)

## 2020-05-13 LAB — RESPIRATORY PANEL BY RT PCR (FLU A&B, COVID)
Influenza A by PCR: NEGATIVE
Influenza B by PCR: NEGATIVE
SARS Coronavirus 2 by RT PCR: NEGATIVE

## 2020-05-13 LAB — COMPREHENSIVE METABOLIC PANEL
ALT: 13 U/L (ref 0–44)
AST: 24 U/L (ref 15–41)
Albumin: 3.8 g/dL (ref 3.5–5.0)
Alkaline Phosphatase: 62 U/L (ref 38–126)
Anion gap: 11 (ref 5–15)
BUN: 21 mg/dL (ref 8–23)
CO2: 26 mmol/L (ref 22–32)
Calcium: 9 mg/dL (ref 8.9–10.3)
Chloride: 105 mmol/L (ref 98–111)
Creatinine, Ser: 0.88 mg/dL (ref 0.44–1.00)
GFR, Estimated: >60 mL/min (ref >60)
Glucose, Bld: 100 mg/dL — ABNORMAL HIGH (ref 70–99)
Potassium: 3.5 mmol/L (ref 3.5–5.1)
Sodium: 142 mmol/L (ref 135–145)
Total Bilirubin: 0.8 mg/dL (ref 0.3–1.2)
Total Protein: 6.4 g/dL — ABNORMAL LOW (ref 6.5–8.1)

## 2020-05-13 LAB — TROPONIN I (HIGH SENSITIVITY)
Troponin I (High Sensitivity): 26 ng/L — ABNORMAL HIGH (ref <18)
Troponin I (High Sensitivity): 29 ng/L — ABNORMAL HIGH (ref <18)

## 2020-05-13 MED ORDER — FENTANYL CITRATE (PF) 100 MCG/2ML IJ SOLN
50.0000 ug | Freq: Once | INTRAMUSCULAR | Status: AC
  Administered 2020-05-13: 50 ug via INTRAVENOUS
  Filled 2020-05-13: qty 2

## 2020-05-13 MED ORDER — FUROSEMIDE 20 MG PO TABS
20.0000 mg | ORAL_TABLET | Freq: Every day | ORAL | Status: DC
  Administered 2020-05-13 – 2020-05-15 (×3): 20 mg via ORAL
  Filled 2020-05-13 (×3): qty 1

## 2020-05-13 MED ORDER — CARBIDOPA-LEVODOPA ER 25-100 MG PO TBCR
2.0000 | EXTENDED_RELEASE_TABLET | Freq: Three times a day (TID) | ORAL | Status: DC
  Administered 2020-05-13 – 2020-05-17 (×11): 2 via ORAL
  Filled 2020-05-13 (×13): qty 2

## 2020-05-13 MED ORDER — ASPIRIN EC 81 MG PO TBEC
81.0000 mg | DELAYED_RELEASE_TABLET | Freq: Every day | ORAL | Status: DC
  Administered 2020-05-13: 81 mg via ORAL
  Filled 2020-05-13: qty 1

## 2020-05-13 MED ORDER — PRAVASTATIN SODIUM 40 MG PO TABS
40.0000 mg | ORAL_TABLET | Freq: Every day | ORAL | Status: DC
  Administered 2020-05-14 – 2020-05-16 (×3): 40 mg via ORAL
  Filled 2020-05-13 (×3): qty 1

## 2020-05-13 MED ORDER — SERTRALINE HCL 25 MG PO TABS
25.0000 mg | ORAL_TABLET | Freq: Every day | ORAL | Status: DC
  Administered 2020-05-14 – 2020-05-17 (×4): 25 mg via ORAL
  Filled 2020-05-13 (×4): qty 1

## 2020-05-13 MED ORDER — BISOPROLOL FUMARATE 5 MG PO TABS
2.5000 mg | ORAL_TABLET | Freq: Every day | ORAL | Status: DC
  Administered 2020-05-13 – 2020-05-17 (×5): 2.5 mg via ORAL
  Filled 2020-05-13 (×5): qty 0.5
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
## 2020-05-13 NOTE — H&P (Signed)
History and Physical   Elizabeth Mccullough FHL:[id number] DOB: December 30, 1940 DOA: 05/13/2020  PCP: Ann Held, DO  Patient coming from: [facility] states  I have personally briefly reviewed patient's old medical records in [place].  Chief Concern: Frequent falling  HPI: Elizabeth Mccullough is a 79 y.o. female with medical history significant for hypertension, Parkinson, combined chronic systolic/diastolic heart failure, hyperlipidemia, fusiform ascending aneurysm, mild carotid artery disease, moderate aortic insufficiency with mild dilatation of the aortic root.  She presents from group home for chief concern of falling 3 times on day of presentation.  Patient denies loss of consciousness dizziness, urinary or bowel incontinence, seizure activity, headache, vision changes, chest pain, shortness of breath, and weakness.  Other review of system was negative for diarrhea, dysuria, hematuria, nausea, vomiting, SI, HI, and unintensional weight loss.  ED Course: Discussed with ED provider.  Admit patient for frequent full work-up.  In the ED she received fentanyl 50 MCG IV x2.  CT of the head without contrast was negative for acute intracranial pathology.  Mild age-related atrophy and chronic microvascular ischemic changes.  X-ray of the left clavicle showed partially visualized comminuted and displaced fracture of the left clavicle.  X-ray of the hip was negative for acute fracture or dislocation.  Review of Systems: As per HPI otherwise 10 point review of systems negative.  Past Medical History:  Diagnosis Date  . AAA (abdominal aortic aneurysm) ([place])   . Carotid artery disease ([place]) 01/12/2017   Carotid US 6/18: Bilat < 50%; repeat in 12/2000  . Chronic combined systolic and diastolic CHF (congestive heart failure) ([place]) 03/01/2017   NICM // [place] 8/18: normal coronary arteries /  Echo 8/18: EF 20-25, mod AI, mod MR, PASP 32 // Echo 1/19: EF 55-60, no RWMA, Gr 1  DD, mod AI, mild MR, PASP 32  . Hyperlipidemia   . Parkinson's disease [facility])    Past Surgical History:  Procedure Laterality Date  . BREAST CYST ASPIRATION  1965   Right Breast  . HAMMER TOE SURGERY  04/10/02   Left Toe  . KNEE SURGERY Right 10/17/15   meniscus repair  . NASAL SEPTUM SURGERY  1980  . RIGHT/LEFT HEART CATH AND CORONARY ANGIOGRAPHY N/A 03/03/2017   Procedure: RIGHT/LEFT HEART CATH AND CORONARY ANGIOGRAPHY;  Surgeon: Nelva Bush, MD;  Location: [place] [facility];  Service: Cardiovascular;  Laterality: N/A;  . THORACIC AORTOGRAM N/A 03/03/2017   Procedure: Thoracic Aortogram;  Surgeon: Nelva Bush, MD;  Location: [place] [facility];  Service: Cardiovascular;  Laterality: N/A;   Social History:  reports that she has never smoked. She has never used smokeless tobacco. She reports current alcohol use. She reports that she does not use drugs.  Allergies  Allergen Reactions  . Iohexol Rash     Code: RASH, Desc: PATIENT STATES SHE IS ALLERGIC TO IV DYE. 20 YRS AGO SHE HAD A REACTION AT [facility], PT WAS GIVEN BENADRYL. 07/13/06/RM, Onset Date: [id number]   . Penicillins Hives   Family History  Problem Relation Age of Onset  . Heart disease Mother   . Hyperlipidemia Sister   . Hyperlipidemia Brother   . Diabetes Sister   . Hyperlipidemia Sister   . Cancer Sister 23       colon  . Cancer Sister        breast  . Stroke Brother   . Hypertension Brother   . Breast cancer Other   . Colon cancer Other   .  Prostate cancer Son   . Diabetes Daughter   . Stomach cancer Daughter   . Healthy Daughter    Family history: Family history reviewed and positive for stroke in brother.   Prior to Admission medications   Medication Sig Start Date End Date Taking? Authorizing Provider  Acetaminophen 325 MG CAPS Take 1-2 capsules by mouth every 8 (eight) hours as needed (moderate to severe pain). 08/21/19   Mellody Dance, DO  aspirin EC 81 MG tablet Take 1 tablet (81 mg  total) by mouth daily. 03/01/17   Nahser, Wonda Cheng, MD  bisoprolol (ZEBETA) 5 MG tablet Take 0.5 tablets (2.5 mg total) by mouth daily. 02/08/20   Nahser, Wonda Cheng, MD  Carbidopa-Levodopa ER (SINEMET CR) 25-100 MG tablet controlled release TAKE 2 TABLETS BY MOUTH 3 TIMES A DAY 01/28/20   Star Age, MD  doxycycline (VIBRAMYCIN) 50 MG capsule Take 50 mg by mouth 2 (two) times daily. 04/14/20   [provider]  furosemide (LASIX) 20 MG tablet Take 1 tablet (20 mg total) by mouth daily. 02/08/20   Nahser, Wonda Cheng, MD  levofloxacin (LEVAQUIN) 250 MG tablet Take 1 tablet (250 mg total) by mouth daily. 12/19/19   Carollee Herter, Kendrick Fries R, DO  lovastatin (MEVACOR) 20 MG tablet TAKE 1 TABLET BY MOUTH AT BEDTIME. 04/07/20   Carollee Herter, Alferd Apa, DO  potassium chloride (KLOR-CON) 10 MEQ tablet Take 1 tablet (10 mEq total) by mouth daily. 02/08/20   Nahser, Wonda Cheng, MD  sertraline (ZOLOFT) 50 MG tablet TAKE 1/2 TABLET BY MOUTH DAILY FOR 8 DAYS THEN INCREASE TO 1 TABLET A DAY 03/25/20   Ann Held, DO   Physical Exam: Vitals:   05/13/20 1730 05/13/20 1745 05/13/20 1800 05/13/20 1845  BP: (!) 182/88 (!) 172/119 (!) 181/83 (!) 174/101  Pulse: 84 84 83 85  Resp: 19 18 16  (!) 21  Temp:      TempSrc:      SpO2: 98% 99% 98% 95%  Weight:      Height:       Constitutional: NAD, calm, comfortable Eyes: PERRL, lids and conjunctivae normal ENMT: Mucous membranes are moist. Posterior pharynx clear of any exudate or lesions. Normal dentition.  Neck: normal, supple, no masses, no thyromegaly Respiratory: clear to auscultation bilaterally, no wheezing, no crackles. Normal respiratory effort. No accessory muscle use.  Cardiovascular: Regular rate and rhythm, no murmurs / rubs / gallops. No extremity edema. 2+ pedal pulses. No carotid bruits.  Abdomen: no tenderness, no masses palpated. No hepatosplenomegaly. Bowel sounds positive.  Musculoskeletal: no clubbing/cyanosis. No joint deformity upper and lower  extremities. Good ROM, no contractures. Normal muscle tone.  Skin: ecchymosis of left anterior chest wall, no rashes, lesions, ulcers. No open skin Neurologic: CN 2-12 grossly intact. Sensation intact on all extremities. Strength  4/5 on the left upper extremities and 5/5 on the right and bilateral lower extremities Psychiatric: Normal judgment and insight. Alert and oriented x 3. Normal mood.   Labs on Admission: I have personally reviewed following labs and imaging studies  CBC: Recent Labs  Lab 05/13/20 1854  WBC 7.9  NEUTROABS 5.5  HGB 10.6*  HCT 33.6*  MCV 99.4  PLT 637   Basic Metabolic Panel: Recent Labs  Lab 05/13/20 1854  NA 142  K 3.5  CL 105  CO2 26  GLUCOSE 100*  BUN 21  CREATININE 0.88  CALCIUM 9.0   GFR: Estimated Creatinine Clearance: 39.7 mL/min (by C-G formula based on SCr  of 0.88 mg/dL). Liver Function Tests: Recent Labs  Lab 05/13/20 1854  AST 24  ALT 13  ALKPHOS 62  BILITOT 0.8  PROT 6.4*  ALBUMIN 3.8   Urine analysis:    Component Value Date/Time   COLORURINE YELLOW 05/13/2020 2102   APPEARANCEUR CLEAR 05/13/2020 2102   LABSPEC 1.009 05/13/2020 2102   PHURINE 6.0 05/13/2020 2102   GLUCOSEU NEGATIVE 05/13/2020 2102   HGBUR NEGATIVE 05/13/2020 2102   HGBUR negative 12/05/2009 0000   BILIRUBINUR NEGATIVE 05/13/2020 2102   BILIRUBINUR negative 12/18/2019 1154   KETONESUR NEGATIVE 05/13/2020 2102   PROTEINUR NEGATIVE 05/13/2020 2102   UROBILINOGEN 1.0 12/18/2019 1154   UROBILINOGEN 0.2 02/27/2012 1857   NITRITE NEGATIVE 05/13/2020 2102   LEUKOCYTESUR TRACE (A) 05/13/2020 2102   Radiological Exams on Admission: Personally reviewed and I agree with radiologist reading as below.  DG Chest 2 View  Result Date: 05/13/2020 CLINICAL DATA:  79 year old female with left rib pain. EXAM: CHEST - 2 VIEW COMPARISON:  Chest radiograph dated 11/27/2019. FINDINGS: There is blunting of the left costophrenic angle with flattening of the left  hemidiaphragm which may represent trace left pleural effusion and left lung base atelectasis. Pneumonia is not excluded clinical correlation is recommended. The right lung is clear. No pneumothorax. Mild cardiomegaly. There is widening of the upper mediastinum similar to prior radiograph. Atherosclerotic calcification of the aorta. No acute osseous pathology. IMPRESSION: Probable trace left pleural effusion and left lung base atelectasis. Pneumonia is not excluded. Electronically Signed   By: Anner Crete M.D.   On: 05/13/2020 18:51   DG Clavicle Left  Result Date: 05/13/2020 CLINICAL DATA:  Fall, clavicular pain EXAM: LEFT CLAVICLE - 2+ VIEWS COMPARISON:  Chest radiograph 05/13/2020 FINDINGS: Fracture of the distal third left clavicle with slight inferior displacement of 1/3 shaft width. Minimal foreshortening across the fracture line. No other acute osseous abnormality is seen. Overlying soft tissue swelling. Acromioclavicular and glenohumeral alignment appears maintained. Background of moderate degenerative change at the acromioclavicular and glenohumeral joints. Mineralization seen about the inferior glenohumeral recess may reflect small joint body. IMPRESSION: 1. Fracture of the distal third left clavicle with slight inferior displacement of 1/3 shaft width. Associated swelling. 2. Moderate degenerative change of the acromioclavicular and glenohumeral joints. Electronically Signed   By: Lovena Le M.D.   On: 05/13/2020 19:36   CT Head Wo Contrast  Result Date: 05/13/2020 CLINICAL DATA:  79 year old female with head trauma. EXAM: CT HEAD WITHOUT CONTRAST CT CERVICAL SPINE WITHOUT CONTRAST TECHNIQUE: Multidetector CT imaging of the head and cervical spine was performed following the standard protocol without intravenous contrast. Multiplanar CT image reconstructions of the cervical spine were also generated. COMPARISON:  CT dated 04/30/2020. FINDINGS: CT HEAD FINDINGS Brain: Mild age-related  atrophy and chronic microvascular ischemic changes. There is no acute intracranial hemorrhage. No mass effect midline shift. No extra-axial fluid collection. Vascular: No hyperdense vessel or unexpected calcification. Skull: Normal. Negative for fracture or focal lesion. Sinuses/Orbits: No acute finding. Other: None CT CERVICAL SPINE FINDINGS Alignment: No acute subluxation. Skull base and vertebrae: No acute cervical spine fracture. Osteopenia. Soft tissues and spinal canal: No prevertebral fluid or swelling. No visible canal hematoma. Disc levels: Multilevel degenerative changes with endplate irregularity and disc space narrowing. Multilevel facet arthropathy. Upper chest: Negative. Other: Partially visualized comminuted and displaced fracture of the left clavicle. IMPRESSION: 1. No acute intracranial pathology. Mild age-related atrophy and chronic microvascular ischemic changes. 2. No acute/traumatic cervical spine pathology. Multilevel degenerative changes. 3. Partially  visualized comminuted and displaced fracture of the left clavicle. Electronically Signed   By: Anner Crete M.D.   On: 05/13/2020 19:38   CT Cervical Spine Wo Contrast  Result Date: 05/13/2020 CLINICAL DATA:  79 year old female with head trauma. EXAM: CT HEAD WITHOUT CONTRAST CT CERVICAL SPINE WITHOUT CONTRAST TECHNIQUE: Multidetector CT imaging of the head and cervical spine was performed following the standard protocol without intravenous contrast. Multiplanar CT image reconstructions of the cervical spine were also generated. COMPARISON:  CT dated 04/30/2020. FINDINGS: CT HEAD FINDINGS Brain: Mild age-related atrophy and chronic microvascular ischemic changes. There is no acute intracranial hemorrhage. No mass effect midline shift. No extra-axial fluid collection. Vascular: No hyperdense vessel or unexpected calcification. Skull: Normal. Negative for fracture or focal lesion. Sinuses/Orbits: No acute finding. Other: None CT CERVICAL  SPINE FINDINGS Alignment: No acute subluxation. Skull base and vertebrae: No acute cervical spine fracture. Osteopenia. Soft tissues and spinal canal: No prevertebral fluid or swelling. No visible canal hematoma. Disc levels: Multilevel degenerative changes with endplate irregularity and disc space narrowing. Multilevel facet arthropathy. Upper chest: Negative. Other: Partially visualized comminuted and displaced fracture of the left clavicle. IMPRESSION: 1. No acute intracranial pathology. Mild age-related atrophy and chronic microvascular ischemic changes. 2. No acute/traumatic cervical spine pathology. Multilevel degenerative changes. 3. Partially visualized comminuted and displaced fracture of the left clavicle. Electronically Signed   By: Anner Crete M.D.   On: 05/13/2020 19:38   DG Knee Complete 4 Views Right  Result Date: 05/13/2020 CLINICAL DATA:  Fall right-sided knee pain with bruising EXAM: RIGHT KNEE - COMPLETE 4+ VIEW COMPARISON:  08/24/2019 FINDINGS: No acute displaced fracture or malalignment. Moderate tricompartment arthritis of the knee. No significant effusion IMPRESSION: No acute osseous abnormality. Electronically Signed   By: Donavan Foil M.D.   On: 05/13/2020 18:52   DG Hip Unilat W or Wo Pelvis 1 View Right  Result Date: 05/13/2020 CLINICAL DATA:  79 year old female with right hip pain. Fall. EXAM: DG HIP (WITH OR WITHOUT PELVIS) 1V RIGHT COMPARISON:  Right hip radiograph dated 04/30/2020. FINDINGS: There is no acute fracture or dislocation. The bones are osteopenic. Moderate bilateral hip osteoarthritic changes. There is degenerative changes of the lower lumbar spine. The soft tissues are unremarkable. IMPRESSION: Negative. Electronically Signed   By: Anner Crete M.D.   On: 05/13/2020 18:59   EKG: Independently reviewed, showing sinus rhythm with left bundle branch block present on previous EKG, QT prolongation with QTC of 558.  Assessment/Plan  Principal Problem:    Displaced fracture of lateral end of left clavicle, initial encounter for closed fracture Active Problems:   Hyperlipidemia   Falls frequently   Memory deficit   Dizziness   Fall   Frequent fall-no loss of consciousness per patient, suspect mechanical fall in the setting of secondary to Parkinson's disease -Echo in 08/11/2017 showed EF of 55 to 14% grade 1 diastolic dysfunction -Patient did not have edema or abnormal heart rate and rhythm on physical exam, however echo has been ordered for valvular and rhythm assessment -CT of the head was negative for acute intracranial pathology -Can consider ambulatory Holter monitor if indicated and or PCP referral for Holter monitor  Left clavicle fracture-closed, displaced less than with of 1 bone with -No clinical and physical exam evidence of neurovascular compromise, respiratory compromise, floating shoulder, no tenting of the skin -Closed fracture -No urgent orthopedic consult indicated at this time, a.m. provider to call orthopedic in the a.m. if indicated and/or referral for outpatient follow-up -  Pain management; fentanyl 12.5 mcg IV every 4 hours as needed for pain -Fall precautions -Admit to observation with telemetry  Ecchymosis of the left anterior upper chest wall around the shoulder and clavicle area-present on admission  Chronic normochromic anemia-at baseline -Hemoglobin range in the last 5 months are 9.3-10.1 -Hemoglobin on CBC in the ED was 10.6, hematocrit 33.6 -CBC in the a.m.  Fusiform thoracic aneurysm of the ascending aorta-chronic and stable -Previous diameter in 2019 4.1x4.3 -Checks x-ray showed widening of the mediastinum that appears unchanged when comparing to previous chest x-ray -No indications for rupture at this time -Continue blood pressure control as appropriate  DVT prophylaxis: Heparin subcu Code Status: Full code Diet: Heart healthy regular Family Communication: Patient states her daughter knows that she  is in the hospital Disposition Plan: Pending clinical course Consults called: A.m. provider to call orthopedic surgeon if indicated Admission status: Observation with telemetry  Deirdra Heumann N Srija Southard D.O. [facility]  If 7AM-7PM, please contact day-coverage provider [email or web address]  05/13/2020, 10:01 PM

## 2020-05-13 NOTE — ED Triage Notes (Addendum)
Pt arrives via EMS for 5 mechanical falls today. BP 190/80, HR 80. Questionable UTI, per EMS.  Increased urination. Pt complains of neck pain- refused neck collar per EMS. Pt from independent living facility- [facility]. No LOC, no blood thinners. CBG 138

## 2020-05-14 ENCOUNTER — Observation Stay (HOSPITAL_COMMUNITY): Payer: Medicare Other

## 2020-05-14 ENCOUNTER — Encounter (HOSPITAL_COMMUNITY): Payer: Self-pay | Admitting: Internal Medicine

## 2020-05-14 ENCOUNTER — Other Ambulatory Visit: Payer: Self-pay

## 2020-05-14 DIAGNOSIS — I5042 Chronic combined systolic (congestive) and diastolic (congestive) heart failure: Secondary | ICD-10-CM | POA: Diagnosis not present

## 2020-05-14 DIAGNOSIS — S42032A Displaced fracture of lateral end of left clavicle, initial encounter for closed fracture: Secondary | ICD-10-CM | POA: Diagnosis not present

## 2020-05-14 DIAGNOSIS — F419 Anxiety disorder, unspecified: Secondary | ICD-10-CM | POA: Diagnosis present

## 2020-05-14 DIAGNOSIS — Z7982 Long term (current) use of aspirin: Secondary | ICD-10-CM | POA: Diagnosis not present

## 2020-05-14 DIAGNOSIS — R42 Dizziness and giddiness: Secondary | ICD-10-CM | POA: Diagnosis not present

## 2020-05-14 DIAGNOSIS — I5043 Acute on chronic combined systolic (congestive) and diastolic (congestive) heart failure: Secondary | ICD-10-CM

## 2020-05-14 DIAGNOSIS — I351 Nonrheumatic aortic (valve) insufficiency: Secondary | ICD-10-CM | POA: Diagnosis not present

## 2020-05-14 DIAGNOSIS — I340 Nonrheumatic mitral (valve) insufficiency: Secondary | ICD-10-CM | POA: Diagnosis not present

## 2020-05-14 DIAGNOSIS — I361 Nonrheumatic tricuspid (valve) insufficiency: Secondary | ICD-10-CM | POA: Diagnosis not present

## 2020-05-14 DIAGNOSIS — I10 Essential (primary) hypertension: Secondary | ICD-10-CM | POA: Diagnosis not present

## 2020-05-14 DIAGNOSIS — Z800 Family history of malignant neoplasm of digestive organs: Secondary | ICD-10-CM | POA: Diagnosis not present

## 2020-05-14 DIAGNOSIS — S42002D Fracture of unspecified part of left clavicle, subsequent encounter for fracture with routine healing: Secondary | ICD-10-CM | POA: Diagnosis not present

## 2020-05-14 DIAGNOSIS — I428 Other cardiomyopathies: Secondary | ICD-10-CM | POA: Diagnosis not present

## 2020-05-14 DIAGNOSIS — D649 Anemia, unspecified: Secondary | ICD-10-CM | POA: Diagnosis present

## 2020-05-14 DIAGNOSIS — Z888 Allergy status to other drugs, medicaments and biological substances status: Secondary | ICD-10-CM | POA: Diagnosis not present

## 2020-05-14 DIAGNOSIS — E785 Hyperlipidemia, unspecified: Secondary | ICD-10-CM | POA: Diagnosis present

## 2020-05-14 DIAGNOSIS — I5032 Chronic diastolic (congestive) heart failure: Secondary | ICD-10-CM | POA: Diagnosis not present

## 2020-05-14 DIAGNOSIS — E876 Hypokalemia: Secondary | ICD-10-CM | POA: Diagnosis present

## 2020-05-14 DIAGNOSIS — Z823 Family history of stroke: Secondary | ICD-10-CM | POA: Diagnosis not present

## 2020-05-14 DIAGNOSIS — Z20822 Contact with and (suspected) exposure to covid-19: Secondary | ICD-10-CM | POA: Diagnosis present

## 2020-05-14 DIAGNOSIS — S42002S Fracture of unspecified part of left clavicle, sequela: Secondary | ICD-10-CM | POA: Diagnosis not present

## 2020-05-14 DIAGNOSIS — I110 Hypertensive heart disease with heart failure: Secondary | ICD-10-CM | POA: Diagnosis present

## 2020-05-14 DIAGNOSIS — K5901 Slow transit constipation: Secondary | ICD-10-CM | POA: Diagnosis not present

## 2020-05-14 DIAGNOSIS — N179 Acute kidney failure, unspecified: Secondary | ICD-10-CM | POA: Diagnosis not present

## 2020-05-14 DIAGNOSIS — S8001XA Contusion of right knee, initial encounter: Secondary | ICD-10-CM | POA: Diagnosis present

## 2020-05-14 DIAGNOSIS — M898X1 Other specified disorders of bone, shoulder: Secondary | ICD-10-CM | POA: Diagnosis not present

## 2020-05-14 DIAGNOSIS — I447 Left bundle-branch block, unspecified: Secondary | ICD-10-CM | POA: Diagnosis present

## 2020-05-14 DIAGNOSIS — R296 Repeated falls: Secondary | ICD-10-CM | POA: Diagnosis present

## 2020-05-14 DIAGNOSIS — F32A Depression, unspecified: Secondary | ICD-10-CM | POA: Diagnosis present

## 2020-05-14 DIAGNOSIS — I77810 Thoracic aortic ectasia: Secondary | ICD-10-CM | POA: Diagnosis not present

## 2020-05-14 DIAGNOSIS — S42009S Fracture of unspecified part of unspecified clavicle, sequela: Secondary | ICD-10-CM | POA: Diagnosis not present

## 2020-05-14 DIAGNOSIS — I5023 Acute on chronic systolic (congestive) heart failure: Secondary | ICD-10-CM | POA: Diagnosis not present

## 2020-05-14 DIAGNOSIS — S42009A Fracture of unspecified part of unspecified clavicle, initial encounter for closed fracture: Secondary | ICD-10-CM | POA: Diagnosis present

## 2020-05-14 DIAGNOSIS — Z79899 Other long term (current) drug therapy: Secondary | ICD-10-CM | POA: Diagnosis not present

## 2020-05-14 DIAGNOSIS — R269 Unspecified abnormalities of gait and mobility: Secondary | ICD-10-CM | POA: Diagnosis not present

## 2020-05-14 DIAGNOSIS — Z880 Allergy status to penicillin: Secondary | ICD-10-CM | POA: Diagnosis not present

## 2020-05-14 DIAGNOSIS — M25551 Pain in right hip: Secondary | ICD-10-CM | POA: Diagnosis present

## 2020-05-14 DIAGNOSIS — G20 Parkinson's disease: Secondary | ICD-10-CM | POA: Diagnosis present

## 2020-05-14 DIAGNOSIS — I951 Orthostatic hypotension: Secondary | ICD-10-CM | POA: Diagnosis present

## 2020-05-14 DIAGNOSIS — I712 Thoracic aortic aneurysm, without rupture: Secondary | ICD-10-CM | POA: Diagnosis present

## 2020-05-14 DIAGNOSIS — G479 Sleep disorder, unspecified: Secondary | ICD-10-CM | POA: Diagnosis not present

## 2020-05-14 DIAGNOSIS — W010XXA Fall on same level from slipping, tripping and stumbling without subsequent striking against object, initial encounter: Secondary | ICD-10-CM | POA: Diagnosis present

## 2020-05-14 LAB — BASIC METABOLIC PANEL
Anion gap: 7 (ref 5–15)
BUN: 17 mg/dL (ref 8–23)
CO2: 30 mmol/L (ref 22–32)
Calcium: 8.8 mg/dL — ABNORMAL LOW (ref 8.9–10.3)
Chloride: 104 mmol/L (ref 98–111)
Creatinine, Ser: 0.8 mg/dL (ref 0.44–1.00)
GFR, Estimated: >60 mL/min (ref >60)
Glucose, Bld: 90 mg/dL (ref 70–99)
Potassium: 3.1 mmol/L — ABNORMAL LOW (ref 3.5–5.1)
Sodium: 141 mmol/L (ref 135–145)

## 2020-05-14 LAB — CBC
HCT: 30.1 % — ABNORMAL LOW (ref 36.0–46.0)
Hemoglobin: 9.9 g/dL — ABNORMAL LOW (ref 12.0–15.0)
MCH: 32.2 pg (ref 26.0–34.0)
MCHC: 32.9 g/dL (ref 30.0–36.0)
MCV: 98 fL (ref 80.0–100.0)
Platelets: 294 10*3/uL (ref 150–400)
RBC: 3.07 MIL/uL — ABNORMAL LOW (ref 3.87–5.11)
RDW: 12.9 % (ref 11.5–15.5)
WBC: 6.4 10*3/uL (ref 4.0–10.5)
nRBC: 0 % (ref 0.0–0.2)

## 2020-05-14 LAB — ECHOCARDIOGRAM COMPLETE
Area-P 1/2: 3.42 cm2
Calc EF: 33.8 %
Height: 62 in
P 1/2 time: 266 msec
S' Lateral: 3.5 cm
Single Plane A2C EF: 41.1 %
Single Plane A4C EF: 23.4 %
Weight: 2080 oz

## 2020-05-14 LAB — TSH: TSH: 2.076 u[IU]/mL (ref 0.350–4.500)

## 2020-05-14 MED ORDER — POTASSIUM CHLORIDE CRYS ER 20 MEQ PO TBCR
40.0000 meq | EXTENDED_RELEASE_TABLET | ORAL | Status: AC
  Administered 2020-05-14 (×2): 40 meq via ORAL
  Filled 2020-05-14 (×2): qty 2

## 2020-05-14 MED ORDER — LISINOPRIL 5 MG PO TABS
2.5000 mg | ORAL_TABLET | Freq: Every day | ORAL | Status: DC
  Administered 2020-05-14 – 2020-05-16 (×3): 2.5 mg via ORAL
  Filled 2020-05-14 (×3): qty 1

## 2020-05-14 MED ORDER — HEPARIN SODIUM (PORCINE) 5000 UNIT/ML IJ SOLN
5000.0000 [IU] | Freq: Three times a day (TID) | INTRAMUSCULAR | Status: DC
  Administered 2020-05-14 – 2020-05-17 (×9): 5000 [IU] via SUBCUTANEOUS
  Filled 2020-05-14 (×9): qty 1

## 2020-05-14 MED ORDER — LORAZEPAM 0.5 MG PO TABS
0.5000 mg | ORAL_TABLET | Freq: Four times a day (QID) | ORAL | Status: DC | PRN
  Administered 2020-05-14 – 2020-05-15 (×3): 0.5 mg via ORAL
  Filled 2020-05-14 (×3): qty 1

## 2020-05-14 MED ORDER — FUROSEMIDE 10 MG/ML IJ SOLN
20.0000 mg | Freq: Once | INTRAMUSCULAR | Status: AC
  Administered 2020-05-14: 20 mg via INTRAVENOUS
  Filled 2020-05-14: qty 2

## 2020-05-14 MED ORDER — FENTANYL CITRATE (PF) 100 MCG/2ML IJ SOLN
12.5000 ug | INTRAMUSCULAR | Status: DC | PRN
  Administered 2020-05-14 (×2): 12.5 ug via INTRAVENOUS
  Filled 2020-05-14 (×2): qty 2

## 2020-05-14 MED ORDER — ACETAMINOPHEN 325 MG PO TABS
650.0000 mg | ORAL_TABLET | Freq: Four times a day (QID) | ORAL | Status: DC | PRN
  Administered 2020-05-14 – 2020-05-15 (×3): 650 mg via ORAL
  Filled 2020-05-14 (×3): qty 2

## 2020-05-14 MED ORDER — POTASSIUM CHLORIDE CRYS ER 20 MEQ PO TBCR
40.0000 meq | EXTENDED_RELEASE_TABLET | Freq: Two times a day (BID) | ORAL | Status: AC
  Administered 2020-05-14 – 2020-05-15 (×2): 40 meq via ORAL
  Filled 2020-05-14 (×2): qty 2

## 2020-05-14 MED ORDER — SODIUM CHLORIDE 0.9 % IV SOLN: INTRAVENOUS | Status: AC

## 2020-05-14 NOTE — Plan of Care (Signed)

## 2020-05-14 NOTE — Evaluation (Signed)
Occupational Therapy Evaluation Patient Details Name: Elizabeth Mccullough MRN: [id number] DOB: 10-22-1940 Today's Date: 05/14/2020    History of Present Illness 79 yo female with onset of many falls had one result in Fracture of the distal third left clavicle with slight inferior displacement of 1/3 shaft width, associated swelling, Moderate degenerative change of L acromioclavicular and glenohumeral joints.  PMHx:  lumbar DJD, CHF, Parkinson's, CHF, AAA, carotid artery disease, R knee meniscal repair,    Clinical Impression   PTA, pt lives at ALF and typically Modified Independent with ADLs and mobility using Rollator. Staff and family assist with IADLs. Pt endorses frequent falls. Pt presents now with deficits in strength, endurance, balance, cognition, coordination and pain. Pt with difficulty maintaining L UE precautions throughout session despite frequent reminders. Hand, wrist, and elbow AROM WFL. Pt overall Max A for bed mobility, Min A x 2 for safe mobility to/from bathroom trialing various handheld assist. Pt noted with shuffling gait and required physical assist to maintain balance. Pt requires overall Mod A for UB ADLs and Max A for LB ADLs. Pt's daughter present during session and would like to try for CIR admission. If pt unable to go to CIR, will need SNF for short term rehab.   Since pt typically uses walker for mobility, continued walker use would be beneficial secondary to balance and cognitive deficits. Confirmed with MD after session, ok for pt to use RW without excessive weightbearing.    Follow Up Recommendations  CIR;Supervision/Assistance - 24 hour;Other (comment) (SNF if unable to go to CIR)    Equipment Recommendations  Wheelchair (measurements OT)    Recommendations for Other Services Rehab consult     Precautions / Restrictions Precautions Precautions: Fall Precaution Comments: Pt has no sling in her room. Per MD, ok to use walker but ecnourage pt to avoid  pushing through L UE too much Required Braces or Orthoses: Sling Restrictions Weight Bearing Restrictions: Yes LUE Weight Bearing: Non weight bearing Other Position/Activity Restrictions: NWB in sling      Mobility Bed Mobility Overal bed mobility: Needs Assistance Bed Mobility: Supine to Sit;Sit to Supine     Supine to sit: Mod assist;HOB elevated Sit to supine: Max assist   General bed mobility comments: Mod A to scoot hips forward, cues to avoid pushing through L UE. Max A to return to bed, heavy assist for B LE    Transfers Overall transfer level: Needs assistance Equipment used: 1 person hand held assist;2 person hand held assist Transfers: Sit to/from [facility] Sit to Stand: Min assist Stand pivot transfers: Min assist       General transfer comment: Min A overall with assistance to correct balance with posterior lean     Balance Overall balance assessment: History of Falls;Needs assistance Sitting-balance support: Feet supported;Single extremity supported Sitting balance-Leahy Scale: Fair   Postural control: Posterior lean Standing balance support: Single extremity supported Standing balance-Leahy Scale: Poor                             ADL either performed or assessed with clinical judgement   ADL Overall ADL's : Needs assistance/impaired Eating/Feeding: Set up;Sitting   Grooming: Sitting;Minimal assistance   Upper Body Bathing: Moderate assistance;Sitting;Cueing for UE precautions   Lower Body Bathing: Maximal assistance;Sit to/from stand   Upper Body Dressing : Moderate assistance;Cueing for UE precautions;Sitting Upper Body Dressing Details (indicate cue type and reason): Mod A for UB dressing with  cues to attend to L UE precautions Lower Body Dressing: Maximal assistance;Sit to/from stand;Sitting/lateral leans   Toilet Transfer: Minimal assistance;+2 for safety/equipment;+2 for physical assistance;Ambulation;BSC;Regular  Glass blower/designer Details (indicate cue type and reason): Min A x 2 to maintain balance, shuffling gait noted. Attempted use of IV pole, 1 and 2 person handheld assist, cueing to avoid L UE use Toileting- Clothing Manipulation and Hygiene: Moderate assistance;Sitting/lateral lean;Sit to/from stand Toileting - Clothing Manipulation Details (indicate cue type and reason): Mod A overall. pt able to get toilet paper, assist with clothing mgmt, but difficulty attending to L UE precaution     Functional mobility during ADLs: Minimal assistance;+2 for physical assistance;+2 for safety/equipment;Cueing for safety;Cueing for sequencing General ADL Comments: limited by L UE injury, as well as cognition in being able to follow precautions, implement safety techniques     Vision Baseline Vision/History: No visual deficits Vision Assessment?: No apparent visual deficits     Perception     Praxis      Pertinent Vitals/Pain Pain Assessment: Faces Faces Pain Scale: Hurts a little bit Pain Location: L shoulder Pain Descriptors / Indicators: Guarding Pain Intervention(s): Limited activity within patient's tolerance;Monitored during session;Repositioned;Ice applied     Hand Dominance Right   Extremity/Trunk Assessment Upper Extremity Assessment Upper Extremity Assessment: LUE deficits/detail LUE Deficits / Details: clavicle fx with bruising around shoulder. Hand, wrist and elbow ROM WFL LUE: Unable to fully assess due to immobilization LUE Sensation: WNL LUE Coordination: decreased fine motor;decreased gross motor   Lower Extremity Assessment Lower Extremity Assessment: Defer to PT evaluation   Cervical / Trunk Assessment Cervical / Trunk Assessment: Kyphotic   Communication Communication Communication: No difficulties   Cognition Arousal/Alertness: Awake/alert Behavior During Therapy: Flat affect Overall Cognitive Status: History of cognitive impairments - at baseline Area of  Impairment: Orientation;Attention;Memory;Following commands;Safety/judgement;Awareness;Problem solving                 Orientation Level: Place Current Attention Level: Sustained Memory: Decreased recall of precautions;Decreased short-term memory Following Commands: Follows one step commands with increased time Safety/Judgement: Decreased awareness of safety;Decreased awareness of deficits Awareness: Intellectual Problem Solving: Slow processing;Decreased initiation;Difficulty sequencing;Requires verbal cues;Requires tactile cues General Comments: Pt reports she is at [facility], not aware she is in hospital but once oriented to place, is able to report she is here due to a fall. 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     Exercises Exercises: Other exercises (BLE strength is 4- hams, DF  and hip add, otherwise [facility])   Shoulder Instructions      Home Living Family/patient expects to be discharged to:: Assisted living (Independent living)                             Home Equipment: Walker - 4 wheels;Grab bars - toilet;Grab bars - tub/shower;Shower seat;Bedside commode   Additional Comments: pt has staff from rehab observing her self care to give her feedback      Prior Functioning/Environment Level of Independence: Needs assistance  Gait / Transfers Assistance Needed: Able to ambulate with Rollator ADL's / Homemaking Assistance Needed: Pt able to bathe, dress, feed and toilet self. Assistance with laundry, goes to dining hall for meals   Comments: Endorses frequent falls        OT Problem List: Decreased  strength;Decreased activity tolerance;Impaired balance (sitting and/or standing);Decreased coordination;Decreased cognition;Decreased safety awareness;Decreased knowledge of use of DME or AE;Decreased  knowledge of precautions;Impaired UE functional use;Pain      OT Treatment/Interventions: Self-care/ADL training;Therapeutic exercise;DME and/or AE instruction;Manual therapy;Therapeutic activities;Patient/family education;Balance training    OT Goals(Current goals can be found in the care plan section) Acute Rehab OT Goals Patient Stated Goal: Daughter wants pt to go to CIR OT Goal Formulation: With patient/family Time For Goal Achievement: 05/28/20 Potential to Achieve Goals: Good ADL Goals Pt Will Perform Grooming: with modified independence;sitting Pt Will Perform Upper Body Dressing: with set-up;sitting Pt Will Perform Lower Body Dressing: with min guard assist;sit to/from stand;sitting/lateral leans Pt Will Transfer to Toilet: with supervision;ambulating;bedside commode Pt Will Perform Toileting - Clothing Manipulation and hygiene: with min guard assist;sit to/from stand;sitting/lateral leans Additional ADL Goal #1: Pt to verbalize precautions for L UE without verbal cues in order to maximize healing and safety during ADLs  OT Frequency: Min 2X/week   Barriers to D/C:            Co-evaluation              AM-PAC OT "6 Clicks" Daily Activity     Outcome Measure Help from another person eating meals?: A Little Help from another person taking care of personal grooming?: A Little Help from another person toileting, which includes using toliet, bedpan, or urinal?: A Lot Help from another person bathing (including washing, rinsing, drying)?: A Lot Help from another person to put on and taking off regular upper body clothing?: A Lot Help from another person to put on and taking off regular lower body clothing?: A Lot 6 Click Score: 14   End of Session Equipment Utilized During Treatment: Gait belt Nurse Communication: Mobility status;Weight bearing status;Other (comment) (Sling, RN present during session)  Activity Tolerance: No increased pain;Patient tolerated treatment  well Patient left: in bed;with call bell/phone within reach;with nursing/sitter in room;with family/visitor present  OT Visit Diagnosis: Unsteadiness on feet (R26.81);Other abnormalities of gait and mobility (R26.89);Repeated falls (R29.6);Muscle weakness (generalized) (M62.81);Ataxia, unspecified (R27.0);Pain Pain - Right/Left: Left Pain - part of body: Shoulder                Time: 5110-2111 OT Time Calculation (min): 32 min Charges:  OT General Charges $OT Visit: 1 Visit OT Evaluation $OT Eval Moderate Complexity: 1 Mod OT Treatments $Self Care/Home Management : 8-22 mins  Layla Maw, OTR/L  Layla Maw 05/14/2020, 1:58 PM

## 2020-05-14 NOTE — Progress Notes (Signed)
Orthopedic Tech Progress Note Patient Details:  Elizabeth Mccullough [birth year] [id number] Went to apply arm sling and patient has IV in left arm. Left sling in room RN will call ortho when IV is placed in other arm. Patient ID: Elizabeth Mccullough, female   DOB: January 07, 1941, 79 y.o.   MRN: [id number]   [name] 05/14/2020, 2:18 PM

## 2020-05-14 NOTE — Progress Notes (Signed)
°  Echocardiogram 2D Echocardiogram has been performed.  Johny Chess 05/14/2020, 10:02 AM

## 2020-05-14 NOTE — Consult Note (Signed)
Reason for Consult:Left clav fx Referring Physician: E [place]  Elizabeth Mccullough is an 79 y.o. female.  HPI: Elizabeth Mccullough is a resident in independent living in a facility here in town. Over the last several days she's fallen at least 5 times. One of those, likely yesterday, she injured her left shoulder. She was brought to the ED where x-rays showed a left clavicle fx. She was admitted and orthopedic surgery was consulted the next day. She is RHD. Daughter is in the room and requests Dr. Mardelle Matte or Tamera Punt f/u and that she be discharged to some sort of rehab, preferably CIR.  Past Medical History:  Diagnosis Date  . AAA (abdominal aortic aneurysm) ([place])   . Carotid artery disease ([place]) 01/12/2017   Carotid US 6/18: Bilat < 50%; repeat in 12/2000  . Chronic combined systolic and diastolic CHF (congestive heart failure) ([place]) 03/01/2017   NICM // [place] 8/18: normal coronary arteries /  Echo 8/18: EF 20-25, mod AI, mod MR, PASP 32 // Echo 1/19: EF 55-60, no RWMA, Gr 1 DD, mod AI, mild MR, PASP 32  . Hyperlipidemia   . Parkinson's disease [facility])     Past Surgical History:  Procedure Laterality Date  . BREAST CYST ASPIRATION  1965   Right Breast  . HAMMER TOE SURGERY  04/10/02   Left Toe  . KNEE SURGERY Right 10/17/15   meniscus repair  . NASAL SEPTUM SURGERY  1980  . RIGHT/LEFT HEART CATH AND CORONARY ANGIOGRAPHY N/A 03/03/2017   Procedure: RIGHT/LEFT HEART CATH AND CORONARY ANGIOGRAPHY;  Surgeon: Nelva Bush, MD;  Location: [facility];  Service: Cardiovascular;  Laterality: N/A;  . THORACIC AORTOGRAM N/A 03/03/2017   Procedure: Thoracic Aortogram;  Surgeon: Nelva Bush, MD;  Location: [facility];  Service: Cardiovascular;  Laterality: N/A;    Family History  Problem Relation Age of Onset  . Heart disease Mother   . Hyperlipidemia Sister   . Hyperlipidemia Brother   . Diabetes Sister   . Hyperlipidemia Sister   . Cancer Sister 88       colon  . Cancer Sister         breast  . Stroke Brother   . Hypertension Brother   . Breast cancer Other   . Colon cancer Other   . Prostate cancer Son   . Diabetes Daughter   . Stomach cancer Daughter   . Healthy Daughter     Social History:  reports that she has never smoked. She has never used smokeless tobacco. She reports current alcohol use. She reports that she does not use drugs.  Allergies:  Allergies  Allergen Reactions  . Iohexol Rash     Code: RASH, Desc: PATIENT STATES SHE IS ALLERGIC TO IV DYE. 20 YRS AGO SHE HAD A REACTION AT [facility], PT WAS GIVEN BENADRYL. 07/13/06/RM, Onset Date: [id number]   . Penicillins Hives    Medications: I have reviewed the patient's current medications.  Results for orders placed or performed during the hospital encounter of 05/13/20 (from the past 48 hour(s))  CBC with Differential     Status: Abnormal   Collection Time: 05/13/20  6:54 PM  Result Value Ref Range   WBC 7.9 4.0 - 10.5 K/uL   RBC 3.38 (L) 3.87 - 5.11 MIL/uL   Hemoglobin 10.6 (L) 12.0 - 15.0 g/dL   HCT 33.6 (L) 36 - 46 %   MCV 99.4 80.0 - 100.0 fL   MCH 31.4 26.0 - 34.0 pg  MCHC 31.5 30.0 - 36.0 g/dL   RDW 12.9 11.5 - 15.5 %   Platelets 329 150 - 400 K/uL   nRBC 0.0 0.0 - 0.2 %   Neutrophils Relative % 69 %   Neutro Abs 5.5 1.7 - 7.7 K/uL   Lymphocytes Relative 20 %   Lymphs Abs 1.6 0.7 - 4.0 K/uL   Monocytes Relative 9 %   Monocytes Absolute 0.7 0.1 - 1.0 K/uL   Eosinophils Relative 1 %   Eosinophils Absolute 0.1 0.0 - 0.5 K/uL   Basophils Relative 1 %   Basophils Absolute 0.1 0.0 - 0.1 K/uL   Immature Granulocytes 0 %   Abs Immature Granulocytes 0.03 0.00 - 0.07 K/uL    Comment: Performed at [facility] [address], [place], [place] [address]  Comprehensive metabolic panel     Status: Abnormal   Collection Time: 05/13/20  6:54 PM  Result Value Ref Range   Sodium 142 135 - 145 mmol/L   Potassium 3.5 3.5 - 5.1 mmol/L   Chloride 105 98 - 111 mmol/L   CO2 26 22 - 32  mmol/L   Glucose, Bld 100 (H) 70 - 99 mg/dL    Comment: Glucose reference range applies only to samples taken after fasting for at least 8 hours.   BUN 21 8 - 23 mg/dL   Creatinine, Ser 0.88 0.44 - 1.00 mg/dL   Calcium 9.0 8.9 - 10.3 mg/dL   Total Protein 6.4 (L) 6.5 - 8.1 g/dL   Albumin 3.8 3.5 - 5.0 g/dL   AST 24 15 - 41 U/L   ALT 13 0 - 44 U/L   Alkaline Phosphatase 62 38 - 126 U/L   Total Bilirubin 0.8 0.3 - 1.2 mg/dL   GFR, Estimated >60 >60 mL/min    Comment: (NOTE) Calculated using the CKD-EPI Creatinine Equation (2021)    Anion gap 11 5 - 15    Comment: Performed at [facility] [address] [place], [place] [address]  Troponin I (High Sensitivity)     Status: Abnormal   Collection Time: 05/13/20  6:54 PM  Result Value Ref Range   Troponin I (High Sensitivity) 26 (H) <18 ng/L    Comment: (NOTE) Elevated high sensitivity troponin I (hsTnI) values and significant  changes across serial measurements may suggest ACS but many other  chronic and acute conditions are known to elevate hsTnI results.  Refer to the "Links" section for chest pain algorithms and additional  guidance. Performed at [facility], [place] [address], [place], [place] [address]   Urinalysis, Routine w reflex microscopic     Status: Abnormal   Collection Time: 05/13/20  9:02 PM  Result Value Ref Range   Color, Urine YELLOW YELLOW   APPearance CLEAR CLEAR   Specific Gravity, Urine 1.009 1.005 - 1.030   pH 6.0 5.0 - 8.0   Glucose, UA NEGATIVE NEGATIVE mg/dL   Hgb urine dipstick NEGATIVE NEGATIVE   Bilirubin Urine NEGATIVE NEGATIVE   Ketones, ur NEGATIVE NEGATIVE mg/dL   Protein, ur NEGATIVE NEGATIVE mg/dL   Nitrite NEGATIVE NEGATIVE   Leukocytes,Ua TRACE (A) NEGATIVE   RBC / HPF 0-5 0 - 5 RBC/hpf   WBC, UA 0-5 0 - 5 WBC/hpf   Bacteria, UA RARE (A) NONE SEEN   Squamous Epithelial / LPF 0-5 0 - 5   Mucus PRESENT     Comment: Performed at [facility], [place] [address]  [place], [place]  [address]  Troponin I (High Sensitivity)     Status: Abnormal   Collection Time: 05/13/20  9:02 PM  Result Value Ref Range   Troponin I (High Sensitivity) 29 (H) <18 ng/L    Comment: (NOTE) Elevated high sensitivity troponin I (hsTnI) values and significant  changes across serial measurements may suggest ACS but many other  chronic and acute conditions are known to elevate hsTnI results.  Refer to the "Links" section for chest pain algorithms and additional  guidance. Performed at [facility], [place] [address] [place], [place] [address]   Respiratory Panel by RT PCR (Flu A&B, Covid) - Nasopharyngeal Swab     Status: None   Collection Time: 05/13/20  9:38 PM   Specimen: Nasopharyngeal Swab  Result Value Ref Range   SARS Coronavirus 2 by RT PCR NEGATIVE NEGATIVE    Comment: (NOTE) SARS-CoV-2 target nucleic acids are NOT DETECTED.  The SARS-CoV-2 RNA is generally detectable in upper respiratoy specimens during the acute phase of infection. The lowest concentration of SARS-CoV-2 viral copies this assay can detect is 131 copies/mL. A negative result does not preclude SARS-Cov-2 infection and should not be used as the sole basis for treatment or other patient management decisions. A negative result may occur with  improper specimen collection/handling, submission of specimen other than nasopharyngeal swab, presence of viral mutation(s) within the areas targeted by this assay, and inadequate number of viral copies (<131 copies/mL). A negative result must be combined with clinical observations, patient history, and epidemiological information. The expected result is Negative.  Fact Sheet for Patients:  PinkCheek.be  Fact Sheet for Healthcare Providers:  GravelBags.it  This test is no t yet approved or cleared by the [place] FDA and  has been authorized for detection and/or diagnosis of SARS-CoV-2 by FDA  under an Emergency Use Authorization (EUA). This EUA will remain  in effect (meaning this test can be used) for the duration of the COVID-19 declaration under Section 564(b)(1) of the Act, 21 U.S.C. section 360bbb-3(b)(1), unless the authorization is terminated or revoked sooner.     Influenza A by PCR NEGATIVE NEGATIVE   Influenza B by PCR NEGATIVE NEGATIVE    Comment: (NOTE) The Xpert Xpress SARS-CoV-2/FLU/RSV assay is intended as an aid in  the diagnosis of influenza from Nasopharyngeal swab specimens and  should not be used as a sole basis for treatment. Nasal washings and  aspirates are unacceptable for Xpert Xpress SARS-CoV-2/FLU/RSV  testing.  Fact Sheet for Patients: PinkCheek.be  Fact Sheet for Healthcare Providers: GravelBags.it  This test is not yet approved or cleared by the [place] FDA and  has been authorized for detection and/or diagnosis of SARS-CoV-2 by  FDA under an Emergency Use Authorization (EUA). This EUA will remain  in effect (meaning this test can be used) for the duration of the  Covid-19 declaration under Section 564(b)(1) of the Act, 21  U.S.C. section 360bbb-3(b)(1), unless the authorization is  terminated or revoked. Performed at [facility], [place] [address] [place], [place] [address]   TSH     Status: None   Collection Time: 05/13/20 10:44 PM  Result Value Ref Range   TSH 2.076 0.350 - 4.500 uIU/mL    Comment: Performed by a 3rd Generation assay with a functional sensitivity of <=0.01 uIU/mL. Performed at [facility], [place] [address] [place], [place] [address]   CBC     Status: Abnormal   Collection Time: 05/14/20  3:16 AM  Result Value Ref Range   WBC 6.4 4.0 - 10.5 K/uL   RBC 3.07 (L) 3.87 - 5.11 MIL/uL   Hemoglobin 9.9 (L) 12.0 - 15.0 g/dL   HCT 30.1 (L) 36 - 46 %   MCV 98.0 80.0 - 100.0 fL   MCH 32.2 26.0 - 34.0 pg   MCHC 32.9 30.0 - 36.0 g/dL   RDW 12.9  11.5 - 15.5 %   Platelets 294 150 - 400 K/uL   nRBC 0.0 0.0 - 0.2 %    Comment: Performed at [facility], [place] [address], [place], [place] [address]  Basic metabolic panel     Status: Abnormal   Collection Time: 05/14/20  3:16 AM  Result Value Ref Range   Sodium 141 135 - 145 mmol/L   Potassium 3.1 (L) 3.5 - 5.1 mmol/L   Chloride 104 98 - 111 mmol/L   CO2 30 22 - 32 mmol/L   Glucose, Bld 90 70 - 99 mg/dL    Comment: Glucose reference range applies only to samples taken after fasting for at least 8 hours.   BUN 17 8 - 23 mg/dL   Creatinine, Ser 0.80 0.44 - 1.00 mg/dL   Calcium 8.8 (L) 8.9 - 10.3 mg/dL   GFR, Estimated >60 >60 mL/min    Comment: (NOTE) Calculated using the CKD-EPI Creatinine Equation (2021)    Anion gap 7 5 - 15    Comment: Performed at [facility] [address], [place], [place] [address]    DG Chest 2 View  Result Date: 05/13/2020 CLINICAL DATA:  79 year old female with left rib pain. EXAM: CHEST - 2 VIEW COMPARISON:  Chest radiograph dated 11/27/2019. FINDINGS: There is blunting of the left costophrenic angle with flattening of the left hemidiaphragm which may represent trace left pleural effusion and left lung base atelectasis. Pneumonia is not excluded clinical correlation is recommended. The right lung is clear. No pneumothorax. Mild cardiomegaly. There is widening of the upper mediastinum similar to prior radiograph. Atherosclerotic calcification of the aorta. No acute osseous pathology. IMPRESSION: Probable trace left pleural effusion and left lung base atelectasis. Pneumonia is not excluded. Electronically Signed   By: Anner Crete M.D.   On: 05/13/2020 18:51   DG Clavicle Left  Result Date: 05/13/2020 CLINICAL DATA:  Fall, clavicular pain EXAM: LEFT CLAVICLE - 2+ VIEWS COMPARISON:  Chest radiograph 05/13/2020 FINDINGS: Fracture of the distal third left clavicle with slight inferior displacement of 1/3 shaft width. Minimal foreshortening  across the fracture line. No other acute osseous abnormality is seen. Overlying soft tissue swelling. Acromioclavicular and glenohumeral alignment appears maintained. Background of moderate degenerative change at the acromioclavicular and glenohumeral joints. Mineralization seen about the inferior glenohumeral recess may reflect small joint body. IMPRESSION: 1. Fracture of the distal third left clavicle with slight inferior displacement of 1/3 shaft width. Associated swelling. 2. Moderate degenerative change of the acromioclavicular and glenohumeral joints. Electronically Signed   By: Lovena Le M.D.   On: 05/13/2020 19:36   CT Head Wo Contrast  Result Date: 05/13/2020 CLINICAL DATA:  79 year old female with head trauma. EXAM: CT HEAD WITHOUT CONTRAST CT CERVICAL SPINE WITHOUT CONTRAST TECHNIQUE: Multidetector CT imaging of the head and cervical spine was performed following the standard protocol without intravenous contrast. Multiplanar CT image reconstructions of the cervical spine were also generated. COMPARISON:  CT dated 04/30/2020. FINDINGS: CT HEAD FINDINGS Brain: Mild age-related atrophy and chronic microvascular ischemic changes. There is no acute intracranial hemorrhage. No mass  effect midline shift. No extra-axial fluid collection. Vascular: No hyperdense vessel or unexpected calcification. Skull: Normal. Negative for fracture or focal lesion. Sinuses/Orbits: No acute finding. Other: None CT CERVICAL SPINE FINDINGS Alignment: No acute subluxation. Skull base and vertebrae: No acute cervical spine fracture. Osteopenia. Soft tissues and spinal canal: No prevertebral fluid or swelling. No visible canal hematoma. Disc levels: Multilevel degenerative changes with endplate irregularity and disc space narrowing. Multilevel facet arthropathy. Upper chest: Negative. Other: Partially visualized comminuted and displaced fracture of the left clavicle. IMPRESSION: 1. No acute intracranial pathology. Mild  age-related atrophy and chronic microvascular ischemic changes. 2. No acute/traumatic cervical spine pathology. Multilevel degenerative changes. 3. Partially visualized comminuted and displaced fracture of the left clavicle. Electronically Signed   By: Anner Crete M.D.   On: 05/13/2020 19:38   CT Cervical Spine Wo Contrast  Result Date: 05/13/2020 CLINICAL DATA:  79 year old female with head trauma. EXAM: CT HEAD WITHOUT CONTRAST CT CERVICAL SPINE WITHOUT CONTRAST TECHNIQUE: Multidetector CT imaging of the head and cervical spine was performed following the standard protocol without intravenous contrast. Multiplanar CT image reconstructions of the cervical spine were also generated. COMPARISON:  CT dated 04/30/2020. FINDINGS: CT HEAD FINDINGS Brain: Mild age-related atrophy and chronic microvascular ischemic changes. There is no acute intracranial hemorrhage. No mass effect midline shift. No extra-axial fluid collection. Vascular: No hyperdense vessel or unexpected calcification. Skull: Normal. Negative for fracture or focal lesion. Sinuses/Orbits: No acute finding. Other: None CT CERVICAL SPINE FINDINGS Alignment: No acute subluxation. Skull base and vertebrae: No acute cervical spine fracture. Osteopenia. Soft tissues and spinal canal: No prevertebral fluid or swelling. No visible canal hematoma. Disc levels: Multilevel degenerative changes with endplate irregularity and disc space narrowing. Multilevel facet arthropathy. Upper chest: Negative. Other: Partially visualized comminuted and displaced fracture of the left clavicle. IMPRESSION: 1. No acute intracranial pathology. Mild age-related atrophy and chronic microvascular ischemic changes. 2. No acute/traumatic cervical spine pathology. Multilevel degenerative changes. 3. Partially visualized comminuted and displaced fracture of the left clavicle. Electronically Signed   By: Anner Crete M.D.   On: 05/13/2020 19:38   DG Knee Complete 4 Views  Right  Result Date: 05/13/2020 CLINICAL DATA:  Fall right-sided knee pain with bruising EXAM: RIGHT KNEE - COMPLETE 4+ VIEW COMPARISON:  08/24/2019 FINDINGS: No acute displaced fracture or malalignment. Moderate tricompartment arthritis of the knee. No significant effusion IMPRESSION: No acute osseous abnormality. Electronically Signed   By: Donavan Foil M.D.   On: 05/13/2020 18:52   DG Hip Unilat W or Wo Pelvis 1 View Right  Result Date: 05/13/2020 CLINICAL DATA:  79 year old female with right hip pain. Fall. EXAM: DG HIP (WITH OR WITHOUT PELVIS) 1V RIGHT COMPARISON:  Right hip radiograph dated 04/30/2020. FINDINGS: There is no acute fracture or dislocation. The bones are osteopenic. Moderate bilateral hip osteoarthritic changes. There is degenerative changes of the lower lumbar spine. The soft tissues are unremarkable. IMPRESSION: Negative. Electronically Signed   By: Anner Crete M.D.   On: 05/13/2020 18:59    Review of Systems  HENT: Negative for ear discharge, ear pain, hearing loss and tinnitus.   Eyes: Negative for photophobia and pain.  Respiratory: Negative for cough and shortness of breath.   Cardiovascular: Negative for chest pain.  Gastrointestinal: Negative for abdominal pain, nausea and vomiting.  Genitourinary: Negative for dysuria, flank pain, frequency and urgency.  Musculoskeletal: Positive for arthralgias (Left shoulder, right knee). Negative for back pain, myalgias and neck pain.  Neurological: Negative for dizziness and  headaches.  Hematological: Does not bruise/bleed easily.  Psychiatric/Behavioral: The patient is not nervous/anxious.    Blood pressure 133/62, pulse 65, temperature 98.2 F (36.8 C), temperature source Oral, resp. rate 16, height 5\' 2"  (1.575 m), weight 59 kg, last menstrual period 07/19/1988, SpO2 98 %. Physical Exam Constitutional:      General: She is not in acute distress.    Appearance: She is well-developed. She is not diaphoretic.  HENT:      Head: Normocephalic and atraumatic.  Eyes:     General: No scleral icterus.       Right eye: No discharge.        Left eye: No discharge.     Conjunctiva/sclera: Conjunctivae normal.  Cardiovascular:     Rate and Rhythm: Normal rate and regular rhythm.  Pulmonary:     Effort: Pulmonary effort is normal. No respiratory distress.  Musculoskeletal:     Cervical back: Normal range of motion.     Comments: Left shoulder, elbow, wrist, digits- no skin wounds, mod TTP clav, some ecchymoses, no instability, no blocks to motion  Sens  Ax/R/M/U intact  Mot   Ax/ R/ PIN/ M/ AIN/ U intact  Rad 2+  RLE No traumatic wounds, ecchymosis, or rash  Mod TTP medial knee  No knee or ankle effusion  Knee stable to varus/ valgus and anterior/posterior stress  Sens DPN, SPN, TN intact  Motor EHL, ext, flex, evers 5/5  DP 2+, PT 2+, No significant edema  Skin:    General: Skin is warm and dry.  Neurological:     Mental Status: She is alert.  Psychiatric:        Behavior: Behavior normal.     Assessment/Plan: Left clav fx -- NWB in sling. F/u with Dr. Mardelle Matte in 2 weeks or so. He will see her in hospital. 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

## 2020-05-14 NOTE — Progress Notes (Addendum)
If any ortho surgery needed. Daughter (POA) requested Dr. Mardelle Matte or Dr. Tamera Punt.

## 2020-05-14 NOTE — Progress Notes (Signed)
Orthopedic Tech Progress Note Patient Details:  Elizabeth Mccullough Sep 24, 1940 [id number]  Ortho Devices Type of Ortho Device: Sling immobilizer Ortho Device/Splint Location: LUE Ortho Device/Splint Interventions: Ordered, Application, Adjustment   Post Interventions Patient Tolerated: Well Instructions Provided: Care of device, Adjustment of device, Poper ambulation with device   [name] 05/14/2020, 3:13 PM

## 2020-05-14 NOTE — Consult Note (Signed)
Cardiology Consultation:   Patient ID: Elizabeth Mccullough MRN: [id number]; DOB: 1940/10/25  Admit date: 05/13/2020 Date of Consult: 05/14/2020  Primary Care Provider: Carollee Herter, Alferd Apa, DO [place] Cardiologist: Mertie Moores, MD  [facility] Electrophysiologist:  None    Patient Profile:   Elizabeth Mccullough is a 79 y.o. female with a hx of NICM in 2018 with EF 20-25%, moderate MR and mod AR, EF improved with medication and 07/2017 EF 55-60%, Parkinson's disease, fusiform ascending aneurysm and mild carotid disease  who is being seen today for the evaluation of CHF and abnormal Echo at the request of Dr. [place]  History of Present Illness:   Elizabeth Mccullough with above hx including normal coronary arteries in 2019 and hx of NICM last echo had improved to 55-60% from 20-25%.  In July her BP was low so losartan stopped.  She has AI, and MR along with Aortic root dilatation followed by Dr.  Prescott Gum.   Hx of LBBB at times in the past.  Pt admitted 05/13/20 after presenting to ER by EMS after a mechanical fall. Lives in independent living facility.   She had fallen 3 times day of presentation.  No loss of consciousness, dizziness or incontinence, no seizures, no chest pain or SOB.     CT head and hip neg for fx or abnormality but + displaced fx of lateral end of left clavicle.  CXR 2V  IMPRESSION: Probable trace left pleural effusion and left lung base atelectasis. Pneumonia is not excluded  Echo done today with EF 30-35%, LV with regional wall motion abnormalities. G1DD, There is moderate hypokinesis of the left ventricular, entire anteroseptal wall, inferoseptal wall and inferior wall. Hypokinesis of the septal/inferior walls, preserved at apex.  Mod to severe AI increased, and  Mild MR and mild TR.    EKG:  The EKG was personally reviewed and demonstrates: SR with LBBB, has a hx of intermittent LBBB Telemetry:  Telemetry was personally reviewed and demonstrates:   SR  Na 141, K+ 3.1, Cr 0.80 hs troponin 26, 29  hgb 9.9 WBC 6.4 plts 294   Today resting well.  Her daughter a nurse is with her.  She has had no syncope.  She has more dyspnea than she used to.  No chest pain. BP 129/64 today and P 68 afebrile by temp   Past Medical History:  Diagnosis Date   AAA (abdominal aortic aneurysm) ([place])    Carotid artery disease ([place]) 01/12/2017   Carotid US 6/18: Bilat < 50%; repeat in 12/2000   Chronic combined systolic and diastolic CHF (congestive heart failure) ([place]) 03/01/2017   NICM // [place] 8/18: normal coronary arteries /  Echo 8/18: EF 20-25, mod AI, mod MR, PASP 32 // Echo 1/19: EF 55-60, no RWMA, Gr 1 DD, mod AI, mild MR, PASP 32   Hyperlipidemia    Parkinson's disease ([place])     Past Surgical History:  Procedure Laterality Date   BREAST CYST ASPIRATION  1965   Right Breast   HAMMER TOE SURGERY  04/10/02   Left Toe   KNEE SURGERY Right 10/17/15   meniscus repair   NASAL SEPTUM SURGERY  1980   RIGHT/LEFT HEART CATH AND CORONARY ANGIOGRAPHY N/A 03/03/2017   Procedure: RIGHT/LEFT HEART CATH AND CORONARY ANGIOGRAPHY;  Surgeon: Nelva Bush, MD;  Location: [facility];  Service: Cardiovascular;  Laterality: N/A;   THORACIC AORTOGRAM N/A 03/03/2017   Procedure: Thoracic Aortogram;  Surgeon: Nelva Bush, MD;  Location: [facility];  Service: Cardiovascular;  Laterality: N/A;     Home Medications:  Prior to Admission medications   Medication Sig Start Date End Date Taking? Authorizing Provider  Acetaminophen 325 MG CAPS Take 1-2 capsules by mouth every 8 (eight) hours as needed (moderate to severe pain). 08/21/19  Yes Opalski, Neoma Laming, DO  aspirin EC 81 MG tablet Take 1 tablet (81 mg total) by mouth daily. 03/01/17  Yes Nahser, Wonda Cheng, MD  bisoprolol (ZEBETA) 5 MG tablet Take 0.5 tablets (2.5 mg total) by mouth daily. 02/08/20  Yes Nahser, Wonda Cheng, MD  Carbidopa-Levodopa ER (SINEMET CR) 25-100 MG tablet controlled release  TAKE 2 TABLETS BY MOUTH 3 TIMES A DAY Patient taking differently: Take 2 tablets by mouth in the morning, at noon, and at bedtime. TAKE 2 TABLETS BY MOUTH 3 TIMES A DAY 01/28/20  Yes Star Age, MD  furosemide (LASIX) 20 MG tablet Take 1 tablet (20 mg total) by mouth daily. 02/08/20  Yes Nahser, Wonda Cheng, MD  ibuprofen (ADVIL) 200 MG tablet Take 400 mg by mouth every 6 (six) hours as needed for moderate pain.   Yes [provider]  lovastatin (MEVACOR) 20 MG tablet TAKE 1 TABLET BY MOUTH AT BEDTIME. Patient taking differently: Take 20 mg by mouth at bedtime.  04/07/20  Yes Roma Schanz R, DO  potassium chloride (KLOR-CON) 10 MEQ tablet Take 1 tablet (10 mEq total) by mouth daily. 02/08/20  Yes Nahser, Wonda Cheng, MD  sertraline (ZOLOFT) 50 MG tablet TAKE 1/2 TABLET BY MOUTH DAILY FOR 8 DAYS THEN INCREASE TO 1 TABLET A DAY Patient taking differently: Take 50 mg by mouth daily.  03/25/20  Yes Ann Held, DO    Inpatient Medications: Scheduled Meds:  bisoprolol  2.5 mg Oral Daily   Carbidopa-Levodopa ER  2 tablet Oral TID   furosemide  20 mg Oral Daily   heparin injection (subcutaneous)  5,000 Units Subcutaneous Q8H   pravastatin  40 mg Oral q1800   sertraline  25 mg Oral Daily   Continuous Infusions:  PRN Meds: fentaNYL (SUBLIMAZE) injection  Allergies:    Allergies  Allergen Reactions   Iohexol Rash     Code: RASH, Desc: PATIENT STATES SHE IS ALLERGIC TO IV DYE. 20 YRS AGO SHE HAD A REACTION AT [facility], PT WAS GIVEN BENADRYL. 07/13/06/RM, Onset Date: [id number]    Penicillins Hives    Social History:   Social History   Socioeconomic History   Marital status: Widowed    Spouse name: Not on file   Number of children: 4   Years of education: Not on file   Highest education level: 12th grade  Occupational History   Occupation: retired  Tobacco Use   Smoking status: Never Smoker   Smokeless tobacco: Never Used  Scientific laboratory technician  Use: Never used  Substance and Sexual Activity   Alcohol use: Yes    Comment: social wine    Drug use: No   Sexual activity: Not Currently    Partners: Male  Other Topics Concern   Not on file  Social History Narrative   Exercise-- no   Pt lives with daughter [name] at home   Social Determinants of Health   Financial Resource Strain: Low Risk    Difficulty of Paying Living Expenses: Not hard at all  Food Insecurity: No Food Insecurity   Worried About Charity fundraiser in the Last Year: Never true  Ran Out of Food in the Last Year: Never true  Transportation Needs: No Transportation Needs   Lack of Transportation (Medical): No   Lack of Transportation (Non-Medical): No  Physical Activity:    Days of Exercise per Week: Not on file   Minutes of Exercise per Session: Not on file  Stress:    Feeling of Stress : Not on file  Social Connections:    Frequency of Communication with Friends and Family: Not on file   Frequency of Social Gatherings with Friends and Family: Not on file   Attends Religious Services: Not on file   Active Member of Clubs or Organizations: Not on file   Attends Archivist Meetings: Not on file   Marital Status: Not on file  Intimate Partner Violence:    Fear of Current or Ex-Partner: Not on file   Emotionally Abused: Not on file   Physically Abused: Not on file   Sexually Abused: Not on file    Family History:    Family History  Problem Relation Age of Onset   Heart disease Mother    Hyperlipidemia Sister    Hyperlipidemia Brother    Diabetes Sister    Hyperlipidemia Sister    Cancer Sister 67       colon   Cancer Sister        breast   Stroke Brother    Hypertension Brother    Breast cancer Other    Colon cancer Other    Prostate cancer Son    Diabetes Daughter    Stomach cancer Daughter    Healthy Daughter      ROS:  Please see the history of present illness.  General:no colds or  fevers, no weight changes Skin:no rashes or ulcers HEENT:no blurred vision, no congestion CV:see HPI PUL:see HPI GI:no diarrhea constipation or melena, no indigestion GU:no hematuria, no dysuria MS:no joint pain, no claudication Neuro:no syncope, no lightheadedness, 3 falls yesterday mechanical, with parkinson's disease Endo:no diabetes, no thyroid disease  All other ROS reviewed and negative.     Physical Exam/Data:   Vitals:   05/13/20 2230 05/14/20 0021 05/14/20 0357 05/14/20 0810  BP: (!) 178/83 (!) 141/77 137/65 133/62  Pulse: 79 72 69 65  Resp: 16 18 17 16   Temp:  97.7 F (36.5 C) 97.9 F (36.6 C) 98.2 F (36.8 C)  TempSrc:  Oral Oral Oral  SpO2: 96% 97% 97% 98%  Weight:      Height:        Intake/Output Summary (Last 24 hours) at 05/14/2020 1516 Last data filed at 05/14/2020 1300 Gross per 24 hour  Intake 594.35 ml  Output 300 ml  Net 294.35 ml   Last 3 Weights 05/13/2020 02/05/2020 12/18/2019  Weight (lbs) 130 lb 131 lb 9.6 oz 128 lb 4.9 oz  Weight (kg) 58.968 kg 59.693 kg 58.2 kg     Body mass index is 23.78 kg/m.  General:  Frail female in NAD, resting in bed  HEENT: normal Lymph: no adenopathy Neck: no JVD Endocrine:  No thryomegaly Vascular: No carotid bruits; pedal pulses 2+ bilaterally  Cardiac:  normal S1, S2; RRR; 2-3/6 murmur no gallup or rub Lungs:  Few rales to auscultation bilaterally, no wheezing, rhonchi   Abd: soft, nontender, no hepatomegaly  Ext: no edema Musculoskeletal:  No deformities, BUE and BLE strength normal and equal Skin: very warm and dry  Neuro:  Alert and oriented X 3 MAE follows commands, no focal abnormalities noted Psych:  Normal affect    Relevant CV Studies: Echo 05/14/20 IMPRESSIONS    1. Left ventricular ejection fraction, by estimation, is 30 to 35%. The  left ventricle has moderately decreased function. The left ventricle  demonstrates regional wall motion abnormalities (see scoring  diagram/findings for  description). Left ventricular  diastolic parameters are consistent with Grade I diastolic dysfunction  (impaired relaxation). There is moderate hypokinesis of the left  ventricular, entire anteroseptal wall, inferoseptal wall and inferior  wall. Hypokinesis of the septal/inferior walls,  preserved at apex.  2. Right ventricular systolic function is normal. The right ventricular  size is normal. There is normal pulmonary artery systolic pressure.  3. Left atrial size was severely dilated.  4. The mitral valve is normal in structure. Mild mitral valve  regurgitation.  5. The aortic valve is tricuspid. There is mild calcification of the  aortic valve. There is mild thickening of the aortic valve. Aortic valve  regurgitation is moderate to severe. Mild to moderate aortic valve  sclerosis/calcification is present, without  any evidence of aortic stenosis.   Comparison(s): Prior images reviewed side by side. Changes from prior  study are noted.   Conclusion(s)/Recommendation(s): I reviewed 2019 study side by side. On my  read, there was mild-moderate LVEF reduction with mild hypokinesis of the  septal and inferior walls in 2019. These abnormalities are now more  prominent/severe on current study.  Aortic regurgitation moderate-severe. Findings communicated with attending  Dr. [place] at 1:44 PM via secure chat.   FINDINGS  Left Ventricle: Left ventricular ejection fraction, by estimation, is 30  to 35%. The left ventricle has moderately decreased function. The left  ventricle demonstrates regional wall motion abnormalities. Moderate  hypokinesis of the left ventricular,  entire anteroseptal wall, inferoseptal wall and inferior wall. The left  ventricular internal cavity size was normal in size. There is no left  ventricular hypertrophy. Abnormal (paradoxical) septal motion, consistent  with left bundle branch block. Left  ventricular diastolic parameters are consistent with  Grade I diastolic  dysfunction (impaired relaxation).     LV Wall Scoring:  Hypokinesis of the septal/inferior walls, preserved at apex.   Right Ventricle: The right ventricular size is normal. Right vetricular  wall thickness was not well visualized. Right ventricular systolic  function is normal. There is normal pulmonary artery systolic pressure.  The tricuspid regurgitant velocity is 2.79  m/s, and with an assumed right atrial pressure of 3 mmHg, the estimated  right ventricular systolic pressure is 75.6 mmHg.   Left Atrium: Left atrial size was severely dilated.   Right Atrium: Right atrial size was normal in size.   Pericardium: There is no evidence of pericardial effusion.   Mitral Valve: The mitral valve is normal in structure. Mild mitral valve  regurgitation.   Tricuspid Valve: The tricuspid valve is normal in structure. Tricuspid  valve regurgitation is mild . No evidence of tricuspid stenosis.   Aortic Valve: The aortic valve is tricuspid. There is mild calcification  of the aortic valve. There is mild thickening of the aortic valve. Aortic  valve regurgitation is moderate to severe. Aortic regurgitation PHT  measures 266 msec. Mild to moderate  aortic valve sclerosis/calcification is present, without any evidence of  aortic stenosis.   Pulmonic Valve: The pulmonic valve was not well visualized. Pulmonic valve  regurgitation is trivial. No evidence of pulmonic stenosis.   Aorta: The aortic root, ascending aorta, aortic arch and descending aorta  are all structurally normal, with no evidence of  dilitation or  obstruction.   IAS/Shunts: The atrial septum is grossly normal.   Laboratory Data:  High Sensitivity Troponin:   Recent Labs  Lab 05/13/20 1854 05/13/20 2102  TROPONINIHS 26* 29*     Chemistry Recent Labs  Lab 05/13/20 1854 05/14/20 0316  NA 142 141  K 3.5 3.1*  CL 105 104  CO2 26 30  GLUCOSE 100* 90  BUN 21 17  CREATININE 0.88 0.80   CALCIUM 9.0 8.8*  GFRNONAA >60 >60  ANIONGAP 11 7    Recent Labs  Lab 05/13/20 1854  PROT 6.4*  ALBUMIN 3.8  AST 24  ALT 13  ALKPHOS 62  BILITOT 0.8   Hematology Recent Labs  Lab 05/13/20 1854 05/14/20 0316  WBC 7.9 6.4  RBC 3.38* 3.07*  HGB 10.6* 9.9*  HCT 33.6* 30.1*  MCV 99.4 98.0  MCH 31.4 32.2  MCHC 31.5 32.9  RDW 12.9 12.9  PLT 329 294   BNPNo results for input(s): BNP, PROBNP in the last 168 hours.  DDimer No results for input(s): DDIMER in the last 168 hours.   Radiology/Studies:  DG Chest 2 View  Result Date: 05/13/2020 CLINICAL DATA:  78 year old female with left rib pain. EXAM: CHEST - 2 VIEW COMPARISON:  Chest radiograph dated 11/27/2019. FINDINGS: There is blunting of the left costophrenic angle with flattening of the left hemidiaphragm which may represent trace left pleural effusion and left lung base atelectasis. Pneumonia is not excluded clinical correlation is recommended. The right lung is clear. No pneumothorax. Mild cardiomegaly. There is widening of the upper mediastinum similar to prior radiograph. Atherosclerotic calcification of the aorta. No acute osseous pathology. IMPRESSION: Probable trace left pleural effusion and left lung base atelectasis. Pneumonia is not excluded. Electronically Signed   By: Anner Crete M.D.   On: 05/13/2020 18:51   DG Clavicle Left  Result Date: 05/13/2020 CLINICAL DATA:  Fall, clavicular pain EXAM: LEFT CLAVICLE - 2+ VIEWS COMPARISON:  Chest radiograph 05/13/2020 FINDINGS: Fracture of the distal third left clavicle with slight inferior displacement of 1/3 shaft width. Minimal foreshortening across the fracture line. No other acute osseous abnormality is seen. Overlying soft tissue swelling. Acromioclavicular and glenohumeral alignment appears maintained. Background of moderate degenerative change at the acromioclavicular and glenohumeral joints. Mineralization seen about the inferior glenohumeral recess may reflect  small joint body. IMPRESSION: 1. Fracture of the distal third left clavicle with slight inferior displacement of 1/3 shaft width. Associated swelling. 2. Moderate degenerative change of the acromioclavicular and glenohumeral joints. Electronically Signed   By: Lovena Le M.D.   On: 05/13/2020 19:36   CT Head Wo Contrast  Result Date: 05/13/2020 CLINICAL DATA:  79 year old female with head trauma. EXAM: CT HEAD WITHOUT CONTRAST CT CERVICAL SPINE WITHOUT CONTRAST TECHNIQUE: Multidetector CT imaging of the head and cervical spine was performed following the standard protocol without intravenous contrast. Multiplanar CT image reconstructions of the cervical spine were also generated. COMPARISON:  CT dated 04/30/2020. FINDINGS: CT HEAD FINDINGS Brain: Mild age-related atrophy and chronic microvascular ischemic changes. There is no acute intracranial hemorrhage. No mass effect midline shift. No extra-axial fluid collection. Vascular: No hyperdense vessel or unexpected calcification. Skull: Normal. Negative for fracture or focal lesion. Sinuses/Orbits: No acute finding. Other: None CT CERVICAL SPINE FINDINGS Alignment: No acute subluxation. Skull base and vertebrae: No acute cervical spine fracture. Osteopenia. Soft tissues and spinal canal: No prevertebral fluid or swelling. No visible canal hematoma. Disc levels: Multilevel degenerative changes with endplate irregularity and disc space  narrowing. Multilevel facet arthropathy. Upper chest: Negative. Other: Partially visualized comminuted and displaced fracture of the left clavicle. IMPRESSION: 1. No acute intracranial pathology. Mild age-related atrophy and chronic microvascular ischemic changes. 2. No acute/traumatic cervical spine pathology. Multilevel degenerative changes. 3. Partially visualized comminuted and displaced fracture of the left clavicle. Electronically Signed   By: Anner Crete M.D.   On: 05/13/2020 19:38   CT Cervical Spine Wo  Contrast  Result Date: 05/13/2020 CLINICAL DATA:  79 year old female with head trauma. EXAM: CT HEAD WITHOUT CONTRAST CT CERVICAL SPINE WITHOUT CONTRAST TECHNIQUE: Multidetector CT imaging of the head and cervical spine was performed following the standard protocol without intravenous contrast. Multiplanar CT image reconstructions of the cervical spine were also generated. COMPARISON:  CT dated 04/30/2020. FINDINGS: CT HEAD FINDINGS Brain: Mild age-related atrophy and chronic microvascular ischemic changes. There is no acute intracranial hemorrhage. No mass effect midline shift. No extra-axial fluid collection. Vascular: No hyperdense vessel or unexpected calcification. Skull: Normal. Negative for fracture or focal lesion. Sinuses/Orbits: No acute finding. Other: None CT CERVICAL SPINE FINDINGS Alignment: No acute subluxation. Skull base and vertebrae: No acute cervical spine fracture. Osteopenia. Soft tissues and spinal canal: No prevertebral fluid or swelling. No visible canal hematoma. Disc levels: Multilevel degenerative changes with endplate irregularity and disc space narrowing. Multilevel facet arthropathy. Upper chest: Negative. Other: Partially visualized comminuted and displaced fracture of the left clavicle. IMPRESSION: 1. No acute intracranial pathology. Mild age-related atrophy and chronic microvascular ischemic changes. 2. No acute/traumatic cervical spine pathology. Multilevel degenerative changes. 3. Partially visualized comminuted and displaced fracture of the left clavicle. Electronically Signed   By: Anner Crete M.D.   On: 05/13/2020 19:38   DG Knee Complete 4 Views Right  Result Date: 05/13/2020 CLINICAL DATA:  Fall right-sided knee pain with bruising EXAM: RIGHT KNEE - COMPLETE 4+ VIEW COMPARISON:  08/24/2019 FINDINGS: No acute displaced fracture or malalignment. Moderate tricompartment arthritis of the knee. No significant effusion IMPRESSION: No acute osseous abnormality.  Electronically Signed   By: Donavan Foil M.D.   On: 05/13/2020 18:52   ECHOCARDIOGRAM COMPLETE  Result Date: 05/14/2020    ECHOCARDIOGRAM REPORT   Patient Name:   Elizabeth Mccullough Date of Exam: 05/14/2020 Medical Rec #:  [id number]            Height:       62.0 in Accession #:    [id number]           Weight:       130.0 lb Date of Birth:  09-20-1940            BSA:          1.592 m Patient Age:    35 years             BP:           133/62 mmHg Patient Gender: F                    HR:           73 bpm. Exam Location:  Inpatient Procedure: 2D Echo Indications:    syncope 780.2  History:        Patient has prior history of Echocardiogram examinations, most                 recent 08/11/2017. Abdominal aortic aneurysm; Risk                 Factors:Dyslipidemia.  Sonographer:  Johny Chess Referring Phys: [id number] AMY N [facility] IMPRESSIONS  1. Left ventricular ejection fraction, by estimation, is 30 to 35%. The left ventricle has moderately decreased function. The left ventricle demonstrates regional wall motion abnormalities (see scoring diagram/findings for description). Left ventricular  diastolic parameters are consistent with Grade I diastolic dysfunction (impaired relaxation). There is moderate hypokinesis of the left ventricular, entire anteroseptal wall, inferoseptal wall and inferior wall. Hypokinesis of the septal/inferior walls,  preserved at apex.  2. Right ventricular systolic function is normal. The right ventricular size is normal. There is normal pulmonary artery systolic pressure.  3. Left atrial size was severely dilated.  4. The mitral valve is normal in structure. Mild mitral valve regurgitation.  5. The aortic valve is tricuspid. There is mild calcification of the aortic valve. There is mild thickening of the aortic valve. Aortic valve regurgitation is moderate to severe. Mild to moderate aortic valve sclerosis/calcification is present, without any evidence of aortic stenosis. Comparison(s):  Prior images reviewed side by side. Changes from prior study are noted. Conclusion(s)/Recommendation(s): I reviewed 2019 study side by side. On my read, there was mild-moderate LVEF reduction with mild hypokinesis of the septal and inferior walls in 2019. These abnormalities are now more prominent/severe on current study. Aortic regurgitation moderate-severe. Findings communicated with attending Dr. [place] at 1:44 PM via secure chat. FINDINGS  Left Ventricle: Left ventricular ejection fraction, by estimation, is 30 to 35%. The left ventricle has moderately decreased function. The left ventricle demonstrates regional wall motion abnormalities. Moderate hypokinesis of the left ventricular, entire anteroseptal wall, inferoseptal wall and inferior wall. The left ventricular internal cavity size was normal in size. There is no left ventricular hypertrophy. Abnormal (paradoxical) septal motion, consistent with left bundle branch block. Left ventricular diastolic parameters are consistent with Grade I diastolic dysfunction (impaired relaxation).  LV Wall Scoring: Hypokinesis of the septal/inferior walls, preserved at apex. Right Ventricle: The right ventricular size is normal. Right vetricular wall thickness was not well visualized. Right ventricular systolic function is normal. There is normal pulmonary artery systolic pressure. The tricuspid regurgitant velocity is 2.79 m/s, and with an assumed right atrial pressure of 3 mmHg, the estimated right ventricular systolic pressure is 45.4 mmHg. Left Atrium: Left atrial size was severely dilated. Right Atrium: Right atrial size was normal in size. Pericardium: There is no evidence of pericardial effusion. Mitral Valve: The mitral valve is normal in structure. Mild mitral valve regurgitation. Tricuspid Valve: The tricuspid valve is normal in structure. Tricuspid valve regurgitation is mild . No evidence of tricuspid stenosis. Aortic Valve: The aortic valve is tricuspid. There  is mild calcification of the aortic valve. There is mild thickening of the aortic valve. Aortic valve regurgitation is moderate to severe. Aortic regurgitation PHT measures 266 msec. Mild to moderate aortic valve sclerosis/calcification is present, without any evidence of aortic stenosis. Pulmonic Valve: The pulmonic valve was not well visualized. Pulmonic valve regurgitation is trivial. No evidence of pulmonic stenosis. Aorta: The aortic root, ascending aorta, aortic arch and descending aorta are all structurally normal, with no evidence of dilitation or obstruction. IAS/Shunts: The atrial septum is grossly normal.  LEFT VENTRICLE PLAX 2D LVIDd:         4.90 cm      Diastology LVIDs:         3.50 cm      LV e' medial:    4.79 cm/s LV PW:         1.00 cm  LV E/e' medial:  15.1 LV IVS:        1.00 cm      LV e' lateral:   5.66 cm/s LVOT diam:     1.80 cm      LV E/e' lateral: 12.8 LV SV:         51 LV SV Index:   32 LVOT Area:     2.54 cm  LV Volumes (MOD) LV vol d, MOD A2C: 120.0 ml LV vol d, MOD A4C: 104.0 ml LV vol s, MOD A2C: 70.7 ml LV vol s, MOD A4C: 79.7 ml LV SV MOD A2C:     49.3 ml LV SV MOD A4C:     104.0 ml LV SV MOD BP:      38.3 ml RIGHT VENTRICLE             IVC RV S prime:     14.00 cm/s  IVC diam: 1.10 cm TAPSE (M-mode): 2.3 cm LEFT ATRIUM             Index       RIGHT ATRIUM           Index LA diam:        2.90 cm 1.82 cm/m  RA Area:     14.60 cm LA Vol (A2C):   80.8 ml 50.76 ml/m RA Volume:   34.20 ml  21.48 ml/m LA Vol (A4C):   84.0 ml 52.77 ml/m LA Biplane Vol: 86.3 ml 54.21 ml/m  AORTIC VALVE LVOT Vmax:   115.00 cm/s LVOT Vmean:  72.500 cm/s LVOT VTI:    0.199 m AI PHT:      266 msec  AORTA Ao Root diam: 2.90 cm MITRAL VALVE                TRICUSPID VALVE MV Area (PHT): 3.42 cm     TR Peak grad:   31.1 mmHg MV Decel Time: 222 msec     TR Vmax:        279.00 cm/s MV E velocity: 72.40 cm/s MV A velocity: 115.00 cm/s  SHUNTS MV E/A ratio:  0.63         Systemic VTI:  0.20 m                              Systemic Diam: 1.80 cm Buford Dresser MD Electronically signed by Buford Dresser MD Signature Date/Time: 05/14/2020/1:44:42 PM    Final    DG Hip Unilat W or Wo Pelvis 1 View Right  Result Date: 05/13/2020 CLINICAL DATA:  79 year old female with right hip pain. Fall. EXAM: DG HIP (WITH OR WITHOUT PELVIS) 1V RIGHT COMPARISON:  Right hip radiograph dated 04/30/2020. FINDINGS: There is no acute fracture or dislocation. The bones are osteopenic. Moderate bilateral hip osteoarthritic changes. There is degenerative changes of the lower lumbar spine. The soft tissues are unremarkable. IMPRESSION: Negative. Electronically Signed   By: Anner Crete M.D.   On: 05/13/2020 18:59     Assessment and Plan:   1. NICM in past with improved EF to 55-60% in 2019. Now EF decreased to 30-35%.   2. chronic combined systolic and diastolic HF  On lasix 20 mg daily.  May need one IV dose defer to dr. Alain Honey 3. 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   4. 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 Last seen 2019 5. Parkinson's disease per neurology and IM here 6. Mild carotid disease less than 50% in Rt and Lt internal carotid arteries in 2018.  7. Fractured Clavicle per IM sling in place.         New [employer] Heart Association (NYHA) Functional Class NYHA Class II        For questions or updates, please contact [facility] Please consult [email or web address] for contact info under    Signed, Cecilie Kicks, NP  05/14/2020 3:16 PM

## 2020-05-14 NOTE — Progress Notes (Signed)
PROGRESS NOTE    Elizabeth Mccullough  WLN:[id number] DOB: March 07, 1941 DOA: 05/13/2020 PCP: Ann Held, DO    Brief Narrative:  Elizabeth Mccullough is a 79 year old female with past medical history notable for essential hypertension, Parkinson's disease, combined chronic systolic/diastolic congestive heart failure, hyperlipidemia, fusiform ascending aortic aneurysm, carotid artery disease, moderate aortic insufficiency with mild dilation of the aortic root who presented from her group home with recurrent falls.  Patient denies loss of consciousness or dizziness spells during these events.  No history of seizures or seizure-like activity, denies any headache or vision changes.  No chest pain shortness of breath or weakness.  In the ED, CT head with and without contrast negative for acute intracranial abnormality but with mild age-related atrophy and chronic microvascular ischemic changes.  X-ray left clavicle notable for comminuted displaced fracture.  Hospitalist service were consulted for further evaluation and treatment of recurrent falls and pain control regarding her clavicle fracture.   Assessment & Plan:   Principal Problem:   Displaced fracture of lateral end of left clavicle, initial encounter for closed fracture Active Problems:   Hyperlipidemia   Falls frequently   Memory deficit   Dizziness   Fall   Clavicle fracture   Recurrent falls Patient presenting with multiple falls over the previous week.  No loss of consciousness.  CT head without contrast negative for acute intracranial abnormality but does note chronic atrophy and microvascular ischemic changes.  Etiology likely secondary to gait disturbance with underlying Parkinson's disease. --PT/OT evaluation recommends CIR --Awaiting CIR evaluation --Continue monitor on telemetry for arrhythmia  Left clavicle fracture, closed Patient presenting with fall as above.  X-ray notable for comminuted/place fracture  left clavicle.  No evidence of neurovascular compromise, no respiratory compromise, no floating shoulder or tenting of the skin above her fracture. --Orthopedics consulted, Dr. Mardelle Matte --Continue shoulder immobilizer --Pain control with --Outpatient follow-up with Dr. 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  Repeat TTE 05/14/2020 with decreased LVEF to 30-35% and LV moderately decreased function, grade 1 diastolic dysfunction, moderate hypokinesis LV, anterior septal wall, inferior septal wall, inferior wall, RV systolic function normal, LA severely dilated, mild MR, moderate/severe AR --Cardiology consulted for further evaluation and recommendations regarding decreased LVEF and wall motion abnormalities on TTE --High-sensitivity troponin 26>29 --Bisoprolol 2.5 mg p.o. daily --Lisinopril 2.5 mg p.o. daily --Lasix 20 mg p.o. daily --Strict I's and O's and daily weights  Hypokalemia --Potassium 3.1, will replete today. --Repeat electrolytes in a.m. to include magnesium  Essential hypertension --Bisoprolol 2.5 mg p.o. daily --Lisinopril 2.5 mg p.o. daily --Lasix 20 mg p.o. daily  Hyperlipidemia: Continue pravastatin 40 mg p.o. daily  Depression: Continue sertraline 25 mg p.o. daily  Parkinson's disease: Continue carbidopa-levodopa ER 50-200 mg TID, outpatient follow-up with neurology Dr. Rexene Alberts  Chronic normocytic anemia Baseline hemoglobin last 5 months 9.3-10.1.  Hemoglobin on admission 10.6, at baseline.  Fusiform thoracic aneurysm ascending aorta, chronic and stable Previous diameter in 2019 4.1 x 4.3 cm.  Last seen by cardiothoracic surgery, Dr. Prescott Gum 01/11/2018.  X-ray notable for widening of the mediastinum that appears unchanged from previous x-rays. --Continue blood pressure control as above --Outpatient follow-up   DVT prophylaxis: Heparin Code Status: Full  code Family Communication: Updated daughter who is present at bedside  Disposition Plan:  Status is: Inpatient  Remains inpatient appropriate because:Persistent severe electrolyte disturbances, Ongoing diagnostic testing needed not appropriate for outpatient work  up, Unsafe d/c plan, IV treatments appropriate due to intensity of illness or inability to take PO and Inpatient level of care appropriate due to severity of illness   Dispo: The patient is from: Home              Anticipated d/c is to: CIR              Anticipated d/c date is: 2 days              Patient currently is not medically stable to d/c.    Consultants:   Cardiology  Procedures:  TTE 05/14/2020: IMPRESSIONS    1. Left ventricular ejection fraction, by estimation, is 30 to 35%. The  left ventricle has moderately decreased function. The left ventricle  demonstrates regional wall motion abnormalities (see scoring  diagram/findings for description). Left ventricular  diastolic parameters are consistent with Grade I diastolic dysfunction  (impaired relaxation). There is moderate hypokinesis of the left  ventricular, entire anteroseptal wall, inferoseptal wall and inferior  wall. Hypokinesis of the septal/inferior walls,  preserved at apex.  2. Right ventricular systolic function is normal. The right ventricular  size is normal. There is normal pulmonary artery systolic pressure.  3. Left atrial size was severely dilated.  4. The mitral valve is normal in structure. Mild mitral valve  regurgitation.  5. The aortic valve is tricuspid. There is mild calcification of the  aortic valve. There is mild thickening of the aortic valve. Aortic valve  regurgitation is moderate to severe. Mild to moderate aortic valve  sclerosis/calcification is present, without  any evidence of aortic stenosis.   Antimicrobials:   None   Subjective: Patient seen and examined at bedside, resting comfortably.  Lying in bed.   No complaints.  Daughter present at bedside, and requesting CIR evaluation.  Denies headache, no dizziness, no chest pain, palpitations, no shortness of breath, no abdominal pain, no weakness, no fatigue, no paresthesias.  No acute events overnight per nursing staff.  Objective: Vitals:   05/14/20 0021 05/14/20 0357 05/14/20 0810 05/14/20 1336  BP: (!) 141/77 137/65 133/62 129/64  Pulse: 72 69 65 70  Resp: 18 17 16 17   Temp: 97.7 F (36.5 C) 97.9 F (36.6 C) 98.2 F (36.8 C) 97.6 F (36.4 C)  TempSrc: Oral Oral Oral Oral  SpO2: 97% 97% 98% 99%  Weight:      Height:        Intake/Output Summary (Last 24 hours) at 05/14/2020 1618 Last data filed at 05/14/2020 1300 Gross per 24 hour  Intake 594.35 ml  Output 300 ml  Net 294.35 ml   Filed Weights   05/13/20 1728  Weight: 59 kg    Examination:  General exam: Appears calm and comfortable, elderly in appearance Respiratory system: Clear to auscultation. Respiratory effort normal.  Oxygenating well on room air Cardiovascular system: S1 & S2 heard, RRR. 3/6 diastolic murmur noted, no JVD, rubs, gallops or clicks. No pedal edema. Gastrointestinal system: Abdomen is nondistended, soft and nontender. No organomegaly or masses felt. Normal bowel sounds heard. Central nervous system: Alert and oriented. No focal neurological deficits. Extremities: Symmetric 5 x 5 power. Skin: Ecchymosis noted left shoulder/clavicle region, right knee, otherwise no rashes, lesions or ulcers Psychiatry: Judgement and insight appear normal. Mood & affect appropriate.     Data Reviewed: I have personally reviewed following labs and imaging studies  CBC: Recent Labs  Lab 05/13/20 1854 05/14/20 0316  WBC 7.9 6.4  NEUTROABS 5.5  --  HGB 10.6* 9.9*  HCT 33.6* 30.1*  MCV 99.4 98.0  PLT 329 503   Basic Metabolic Panel: Recent Labs  Lab 05/13/20 1854 05/14/20 0316  NA 142 141  K 3.5 3.1*  CL 105 104  CO2 26 30  GLUCOSE 100* 90  BUN 21 17   CREATININE 0.88 0.80  CALCIUM 9.0 8.8*   GFR: Estimated Creatinine Clearance: 43.6 mL/min (by C-G formula based on SCr of 0.8 mg/dL). Liver Function Tests: Recent Labs  Lab 05/13/20 1854  AST 24  ALT 13  ALKPHOS 62  BILITOT 0.8  PROT 6.4*  ALBUMIN 3.8   No results for input(s): LIPASE, AMYLASE in the last 168 hours. No results for input(s): AMMONIA in the last 168 hours. Coagulation Profile: No results for input(s): INR, PROTIME in the last 168 hours. Cardiac Enzymes: No results for input(s): CKTOTAL, CKMB, CKMBINDEX, TROPONINI in the last 168 hours. BNP (last 3 results) No results for input(s): PROBNP in the last 8760 hours. HbA1C: No results for input(s): HGBA1C in the last 72 hours. CBG: No results for input(s): GLUCAP in the last 168 hours. Lipid Profile: No results for input(s): CHOL, HDL, LDLCALC, TRIG, CHOLHDL, LDLDIRECT in the last 72 hours. Thyroid Function Tests: Recent Labs    05/13/20 2244  TSH 2.076   Anemia Panel: No results for input(s): VITAMINB12, FOLATE, FERRITIN, TIBC, IRON, RETICCTPCT in the last 72 hours. Sepsis Labs: No results for input(s): PROCALCITON, LATICACIDVEN in the last 168 hours.  Recent Results (from the past 240 hour(s))  Respiratory Panel by RT PCR (Flu A&B, Covid) - Nasopharyngeal Swab     Status: None   Collection Time: 05/13/20  9:38 PM   Specimen: Nasopharyngeal Swab  Result Value Ref Range Status   SARS Coronavirus 2 by RT PCR NEGATIVE NEGATIVE Final    Comment: (NOTE) SARS-CoV-2 target nucleic acids are NOT DETECTED.  The SARS-CoV-2 RNA is generally detectable in upper respiratoy specimens during the acute phase of infection. The lowest concentration of SARS-CoV-2 viral copies this assay can detect is 131 copies/mL. A negative result does not preclude SARS-Cov-2 infection and should not be used as the sole basis for treatment or other patient management decisions. A negative result may occur with  improper specimen  collection/handling, submission of specimen other than nasopharyngeal swab, presence of viral mutation(s) within the areas targeted by this assay, and inadequate number of viral copies (<131 copies/mL). A negative result must be combined with clinical observations, patient history, and epidemiological information. The expected result is Negative.  Fact Sheet for Patients:  PinkCheek.be  Fact Sheet for Healthcare Providers:  GravelBags.it  This test is no t yet approved or cleared by the [place] FDA and  has been authorized for detection and/or diagnosis of SARS-CoV-2 by FDA under an Emergency Use Authorization (EUA). This EUA will remain  in effect (meaning this test can be used) for the duration of the COVID-19 declaration under Section 564(b)(1) of the Act, 21 U.S.C. section 360bbb-3(b)(1), unless the authorization is terminated or revoked sooner.     Influenza A by PCR NEGATIVE NEGATIVE Final   Influenza B by PCR NEGATIVE NEGATIVE Final    Comment: (NOTE) The Xpert Xpress SARS-CoV-2/FLU/RSV assay is intended as an aid in  the diagnosis of influenza from Nasopharyngeal swab specimens and  should not be used as a sole basis for treatment. Nasal washings and  aspirates are unacceptable for Xpert Xpress SARS-CoV-2/FLU/RSV  testing.  Fact Sheet for Patients: PinkCheek.be  Fact Sheet for  Healthcare Providers: GravelBags.it  This test is not yet approved or cleared by the [place] and  has been authorized for detection and/or diagnosis of SARS-CoV-2 by  FDA under an Emergency Use Authorization (EUA). This EUA will remain  in effect (meaning this test can be used) for the duration of the  Covid-19 declaration under Section 564(b)(1) of the Act, 21  U.S.C. section 360bbb-3(b)(1), unless the authorization is  terminated or revoked. Performed at [facility], [place] [address], [place],  [address]          Radiology Studies: DG Chest 2 View  Result Date: 05/13/2020 CLINICAL DATA:  79 year old female with left rib pain. EXAM: CHEST - 2 VIEW COMPARISON:  Chest radiograph dated 11/27/2019. FINDINGS: There is blunting of the left costophrenic angle with flattening of the left hemidiaphragm which may represent trace left pleural effusion and left lung base atelectasis. Pneumonia is not excluded clinical correlation is recommended. The right lung is clear. No pneumothorax. Mild cardiomegaly. There is widening of the upper mediastinum similar to prior radiograph. Atherosclerotic calcification of the aorta. No acute osseous pathology. IMPRESSION: Probable trace left pleural effusion and left lung base atelectasis. Pneumonia is not excluded. Electronically Signed   By: Anner Crete M.D.   On: 05/13/2020 18:51   DG Clavicle Left  Result Date: 05/13/2020 CLINICAL DATA:  Fall, clavicular pain EXAM: LEFT CLAVICLE - 2+ VIEWS COMPARISON:  Chest radiograph 05/13/2020 FINDINGS: Fracture of the distal third left clavicle with slight inferior displacement of 1/3 shaft width. Minimal foreshortening across the fracture line. No other acute osseous abnormality is seen. Overlying soft tissue swelling. Acromioclavicular and glenohumeral alignment appears maintained. Background of moderate degenerative change at the acromioclavicular and glenohumeral joints. Mineralization seen about the inferior glenohumeral recess may reflect small joint body. IMPRESSION: 1. Fracture of the distal third left clavicle with slight inferior displacement of 1/3 shaft width. Associated swelling. 2. Moderate degenerative change of the acromioclavicular and glenohumeral joints. Electronically Signed   By: Lovena Le M.D.   On: 05/13/2020 19:36   CT Head Wo Contrast  Result Date: 05/13/2020 CLINICAL DATA:  79 year old female with head trauma. EXAM: CT HEAD WITHOUT  CONTRAST CT CERVICAL SPINE WITHOUT CONTRAST TECHNIQUE: Multidetector CT imaging of the head and cervical spine was performed following the standard protocol without intravenous contrast. Multiplanar CT image reconstructions of the cervical spine were also generated. COMPARISON:  CT dated 04/30/2020. FINDINGS: CT HEAD FINDINGS Brain: Mild age-related atrophy and chronic microvascular ischemic changes. There is no acute intracranial hemorrhage. No mass effect midline shift. No extra-axial fluid collection. Vascular: No hyperdense vessel or unexpected calcification. Skull: Normal. Negative for fracture or focal lesion. Sinuses/Orbits: No acute finding. Other: None CT CERVICAL SPINE FINDINGS Alignment: No acute subluxation. Skull base and vertebrae: No acute cervical spine fracture. Osteopenia. Soft tissues and spinal canal: No prevertebral fluid or swelling. No visible canal hematoma. Disc levels: Multilevel degenerative changes with endplate irregularity and disc space narrowing. Multilevel facet arthropathy. Upper chest: Negative. Other: Partially visualized comminuted and displaced fracture of the left clavicle. IMPRESSION: 1. No acute intracranial pathology. Mild age-related atrophy and chronic microvascular ischemic changes. 2. No acute/traumatic cervical spine pathology. Multilevel degenerative changes. 3. Partially visualized comminuted and displaced fracture of the left clavicle. Electronically Signed   By: Anner Crete M.D.   On: 05/13/2020 19:38   CT Cervical Spine Wo Contrast  Result Date: 05/13/2020 CLINICAL DATA:  79 year old female with head trauma. EXAM: CT HEAD WITHOUT CONTRAST  CT CERVICAL SPINE WITHOUT CONTRAST TECHNIQUE: Multidetector CT imaging of the head and cervical spine was performed following the standard protocol without intravenous contrast. Multiplanar CT image reconstructions of the cervical spine were also generated. COMPARISON:  CT dated 04/30/2020. FINDINGS: CT HEAD FINDINGS  Brain: Mild age-related atrophy and chronic microvascular ischemic changes. There is no acute intracranial hemorrhage. No mass effect midline shift. No extra-axial fluid collection. Vascular: No hyperdense vessel or unexpected calcification. Skull: Normal. Negative for fracture or focal lesion. Sinuses/Orbits: No acute finding. Other: None CT CERVICAL SPINE FINDINGS Alignment: No acute subluxation. Skull base and vertebrae: No acute cervical spine fracture. Osteopenia. Soft tissues and spinal canal: No prevertebral fluid or swelling. No visible canal hematoma. Disc levels: Multilevel degenerative changes with endplate irregularity and disc space narrowing. Multilevel facet arthropathy. Upper chest: Negative. Other: Partially visualized comminuted and displaced fracture of the left clavicle. IMPRESSION: 1. No acute intracranial pathology. Mild age-related atrophy and chronic microvascular ischemic changes. 2. No acute/traumatic cervical spine pathology. Multilevel degenerative changes. 3. Partially visualized comminuted and displaced fracture of the left clavicle. Electronically Signed   By: Anner Crete M.D.   On: 05/13/2020 19:38   DG Knee Complete 4 Views Right  Result Date: 05/13/2020 CLINICAL DATA:  Fall right-sided knee pain with bruising EXAM: RIGHT KNEE - COMPLETE 4+ VIEW COMPARISON:  08/24/2019 FINDINGS: No acute displaced fracture or malalignment. Moderate tricompartment arthritis of the knee. No significant effusion IMPRESSION: No acute osseous abnormality. Electronically Signed   By: Donavan Foil M.D.   On: 05/13/2020 18:52   ECHOCARDIOGRAM COMPLETE  Result Date: 05/14/2020    ECHOCARDIOGRAM REPORT   Patient Name:   CEDAR ROSEMAN Date of Exam: 05/14/2020 Medical Rec #:  [id number]            Height:       62.0 in Accession #:    [id number]           Weight:       130.0 lb Date of Birth:  10/05/1940            BSA:          1.592 m Patient Age:    85 years             BP:            133/62 mmHg Patient Gender: F                    HR:           73 bpm. Exam Location:  Inpatient Procedure: 2D Echo Indications:    syncope 780.2  History:        Patient has prior history of Echocardiogram examinations, most                 recent 08/11/2017. Abdominal aortic aneurysm; Risk                 Factors:Dyslipidemia.  Sonographer:    Johny Chess Referring Phys: [id number] AMY N COX IMPRESSIONS  1. Left ventricular ejection fraction, by estimation, is 30 to 35%. The left ventricle has moderately decreased function. The left ventricle demonstrates regional wall motion abnormalities (see scoring diagram/findings for description). Left ventricular  diastolic parameters are consistent with Grade I diastolic dysfunction (impaired relaxation). There is moderate hypokinesis of the left ventricular, entire anteroseptal wall, inferoseptal wall and inferior wall. Hypokinesis of the septal/inferior walls,  preserved at apex.  2. Right ventricular systolic function is normal. The  right ventricular size is normal. There is normal pulmonary artery systolic pressure.  3. Left atrial size was severely dilated.  4. The mitral valve is normal in structure. Mild mitral valve regurgitation.  5. The aortic valve is tricuspid. There is mild calcification of the aortic valve. There is mild thickening of the aortic valve. Aortic valve regurgitation is moderate to severe. Mild to moderate aortic valve sclerosis/calcification is present, without any evidence of aortic stenosis. Comparison(s): Prior images reviewed side by side. Changes from prior study are noted. Conclusion(s)/Recommendation(s): I reviewed 2019 study side by side. On my read, there was mild-moderate LVEF reduction with mild hypokinesis of the septal and inferior walls in 2019. These abnormalities are now more prominent/severe on current study. Aortic regurgitation moderate-severe. Findings communicated with attending Dr. [place] at 1:44 PM via secure chat.  FINDINGS  Left Ventricle: Left ventricular ejection fraction, by estimation, is 30 to 35%. The left ventricle has moderately decreased function. The left ventricle demonstrates regional wall motion abnormalities. Moderate hypokinesis of the left ventricular, entire anteroseptal wall, inferoseptal wall and inferior wall. The left ventricular internal cavity size was normal in size. There is no left ventricular hypertrophy. Abnormal (paradoxical) septal motion, consistent with left bundle branch block. Left ventricular diastolic parameters are consistent with Grade I diastolic dysfunction (impaired relaxation).  LV Wall Scoring: Hypokinesis of the septal/inferior walls, preserved at apex. Right Ventricle: The right ventricular size is normal. Right vetricular wall thickness was not well visualized. Right ventricular systolic function is normal. There is normal pulmonary artery systolic pressure. The tricuspid regurgitant velocity is 2.79 m/s, and with an assumed right atrial pressure of 3 mmHg, the estimated right ventricular systolic pressure is 78.5 mmHg. Left Atrium: Left atrial size was severely dilated. Right Atrium: Right atrial size was normal in size. Pericardium: There is no evidence of pericardial effusion. Mitral Valve: The mitral valve is normal in structure. Mild mitral valve regurgitation. Tricuspid Valve: The tricuspid valve is normal in structure. Tricuspid valve regurgitation is mild . No evidence of tricuspid stenosis. Aortic Valve: The aortic valve is tricuspid. There is mild calcification of the aortic valve. There is mild thickening of the aortic valve. Aortic valve regurgitation is moderate to severe. Aortic regurgitation PHT measures 266 msec. Mild to moderate aortic valve sclerosis/calcification is present, without any evidence of aortic stenosis. Pulmonic Valve: The pulmonic valve was not well visualized. Pulmonic valve regurgitation is trivial. No evidence of pulmonic stenosis. Aorta: The  aortic root, ascending aorta, aortic arch and descending aorta are all structurally normal, with no evidence of dilitation or obstruction. IAS/Shunts: The atrial septum is grossly normal.  LEFT VENTRICLE PLAX 2D LVIDd:         4.90 cm      Diastology LVIDs:         3.50 cm      LV e' medial:    4.79 cm/s LV PW:         1.00 cm      LV E/e' medial:  15.1 LV IVS:        1.00 cm      LV e' lateral:   5.66 cm/s LVOT diam:     1.80 cm      LV E/e' lateral: 12.8 LV SV:         51 LV SV Index:   32 LVOT Area:     2.54 cm  LV Volumes (MOD) LV vol d, MOD A2C: 120.0 ml LV vol d, MOD A4C: 104.0 ml LV  vol s, MOD A2C: 70.7 ml LV vol s, MOD A4C: 79.7 ml LV SV MOD A2C:     49.3 ml LV SV MOD A4C:     104.0 ml LV SV MOD BP:      38.3 ml RIGHT VENTRICLE             IVC RV S prime:     14.00 cm/s  IVC diam: 1.10 cm TAPSE (M-mode): 2.3 cm LEFT ATRIUM             Index       RIGHT ATRIUM           Index LA diam:        2.90 cm 1.82 cm/m  RA Area:     14.60 cm LA Vol (A2C):   80.8 ml 50.76 ml/m RA Volume:   34.20 ml  21.48 ml/m LA Vol (A4C):   84.0 ml 52.77 ml/m LA Biplane Vol: 86.3 ml 54.21 ml/m  AORTIC VALVE LVOT Vmax:   115.00 cm/s LVOT Vmean:  72.500 cm/s LVOT VTI:    0.199 m AI PHT:      266 msec  AORTA Ao Root diam: 2.90 cm MITRAL VALVE                TRICUSPID VALVE MV Area (PHT): 3.42 cm     TR Peak grad:   31.1 mmHg MV Decel Time: 222 msec     TR Vmax:        279.00 cm/s MV E velocity: 72.40 cm/s MV A velocity: 115.00 cm/s  SHUNTS MV E/A ratio:  0.63         Systemic VTI:  0.20 m                             Systemic Diam: 1.80 cm Buford Dresser MD Electronically signed by Buford Dresser MD Signature Date/Time: 05/14/2020/1:44:42 PM    Final    DG Hip Unilat W or Wo Pelvis 1 View Right  Result Date: 05/13/2020 CLINICAL DATA:  79 year old female with right hip pain. Fall. EXAM: DG HIP (WITH OR WITHOUT PELVIS) 1V RIGHT COMPARISON:  Right hip radiograph dated 04/30/2020. FINDINGS: There is no acute  fracture or dislocation. The bones are osteopenic. Moderate bilateral hip osteoarthritic changes. There is degenerative changes of the lower lumbar spine. The soft tissues are unremarkable. IMPRESSION: Negative. Electronically Signed   By: Anner Crete M.D.   On: 05/13/2020 18:59        Scheduled Meds: . bisoprolol  2.5 mg Oral Daily  . Carbidopa-Levodopa ER  2 tablet Oral TID  . furosemide  20 mg Oral Daily  . heparin injection (subcutaneous)  5,000 Units Subcutaneous Q8H  . pravastatin  40 mg Oral q1800  . sertraline  25 mg Oral Daily   Continuous Infusions:   LOS: 0 days    Time spent: 39 minutes spent on chart review, discussion with nursing staff, consultants, updating family and interview/physical exam; more than 50% of that time was spent in counseling and/or coordination of care.    Ashaunte Standley J [place] DO [facility] Available via Epic secure chat 7am-7pm After these hours, please refer to coverage provider listed on [email or web address] 05/14/2020, 4:18 PM

## 2020-05-14 NOTE — Evaluation (Signed)
Physical Therapy Evaluation Patient Details Name: Elizabeth Mccullough MRN: [id number] DOB: Dec 12, 1940 Today's Date: 05/14/2020   History of Present Illness  79 yo female with onset of many falls had one result in Fracture of the distal third left clavicle with slight inferior displacement of 1/3 shaft width, associated swelling, Moderate degenerative change of L acromioclavicular and glenohumeral joints.  PMHx:  lumbar DJD, CHF, Parkinson's, CHF, AAA, carotid artery disease, R knee meniscal repair,   Clinical Impression  Pt was seen for mobility on level surface in her room, with a focus on avoiding her continual attempts to use LUE and avoiding LOB with standing.  PT used gait belt and her RUE to steady, but pt is not retaining this information about LUE limits.  Pt has her daughter in attendance and will need to decide how to direct rehab.  [facility] is family and pt first choice, and PT will agree due to the plethora of things to recover, including stiffness of LE's, weakness and Parkinson's driven LOB.  Daughter does not feel pt has it, but is noted on her chart.  Follow acutely to make progress on all goals and esp to maintain safety with bed mob and gait regarding LUE NWB and ROM.    Follow Up Recommendations [facility]    Equipment Recommendations  None recommended by PT    Recommendations for Other Services Rehab consult     Precautions / Restrictions Precautions Precautions: Fall Precaution Comments: Pt has no sling in her room. Per MD, ok to use walker but ecnourage pt to avoid pushing through L UE too much Required Braces or Orthoses: Sling Restrictions Weight Bearing Restrictions: Yes LUE Weight Bearing: Non weight bearing Other Position/Activity Restrictions: NWB in sling      Mobility  Bed Mobility Overal bed mobility: Needs Assistance Bed Mobility: Supine to Sit;Sit to Supine     Supine to sit: Mod assist;HOB elevated Sit to supine: Max assist   General bed mobility  comments: Mod A to scoot hips forward, cues to avoid pushing through L UE. Max A to return to bed, heavy assist for B LE    Transfers Overall transfer level: Needs assistance Equipment used: 1 person hand held assist;2 person hand held assist Transfers: Sit to/from [facility] Sit to Stand: Min assist Stand pivot transfers: Min assist       General transfer comment: Min A overall with assistance to correct balance with posterior lean   Ambulation/Gait Ambulation/Gait assistance: Min assist Gait Distance (Feet): 30 Feet (15 x 2) Assistive device: IV Pole;1 person hand held assist Gait Pattern/deviations: Decreased stride length;Step-to pattern;Step-through pattern;Wide base of support;Staggering left;Staggering right Gait velocity: reduced Gait velocity interpretation: <1.8 ft/sec, indicate of risk for recurrent falls General Gait Details: pt is listing in mult directions, esp not safe to turn corners and steady to pivot and sit  Stairs            Wheelchair Mobility    Modified Rankin (Stroke Patients Only)       Balance Overall balance assessment: History of Falls;Needs assistance Sitting-balance support: Feet supported;Single extremity supported Sitting balance-Leahy Scale: Fair   Postural control: Posterior lean Standing balance support: Single extremity supported Standing balance-Leahy Scale: Poor                               Pertinent Vitals/Pain Pain Assessment: Faces Faces Pain Scale: Hurts a little bit Pain Location: L shoulder Pain Descriptors /  Indicators: Guarding Pain Intervention(s): Limited activity within patient's tolerance;Monitored during session;Repositioned;Ice applied    Home Living Family/patient expects to be discharged to:: Assisted living (Independent living)               Home Equipment: Walker - 4 wheels;Grab bars - toilet;Grab bars - tub/shower;Shower seat;Bedside commode Additional Comments: pt  has staff from rehab observing her self care to give her feedback    Prior Function Level of Independence: Needs assistance   Gait / Transfers Assistance Needed: Able to ambulate with Rollator  ADL's / Homemaking Assistance Needed: Pt able to bathe, dress, feed and toilet self. Assistance with laundry, goes to dining hall for meals  Comments: Endorses frequent falls     Hand Dominance   Dominant Hand: Right    Extremity/Trunk Assessment   Upper Extremity Assessment Upper Extremity Assessment: LUE deficits/detail LUE Deficits / Details: clavicle fx with bruising around shoulder. Hand, wrist and elbow ROM WFL LUE: Unable to fully assess due to immobilization LUE Sensation: WNL LUE Coordination: decreased fine motor;decreased gross motor    Lower Extremity Assessment Lower Extremity Assessment: Defer to PT evaluation    Cervical / Trunk Assessment Cervical / Trunk Assessment: Kyphotic  Communication   Communication: No difficulties  Cognition Arousal/Alertness: Awake/alert Behavior During Therapy: Flat affect Overall Cognitive Status: History of cognitive impairments - at baseline Area of Impairment: Orientation;Attention;Memory;Following commands;Safety/judgement;Awareness;Problem solving                 Orientation Level: Place Current Attention Level: Sustained Memory: Decreased recall of precautions;Decreased short-term memory Following Commands: Follows one step commands with increased time Safety/Judgement: Decreased awareness of safety;Decreased awareness of deficits Awareness: Intellectual Problem Solving: Slow processing;Decreased initiation;Difficulty sequencing;Requires verbal cues;Requires tactile cues General Comments: Pt reports she is at [facility], not aware she is in hospital but once oriented to place, is able to report she is here due to a fall. Pt with decreased memory for precautions requiring frequent cues to avoid L UE use, decreased  safety awarness      General Comments General comments (skin integrity, edema, etc.): Pt's daughter present during session, reports she is a Marine scientist.     Exercises     Assessment/Plan    PT Assessment Patient needs continued PT services  PT Problem List Decreased strength;Decreased range of motion;Decreased activity tolerance;Decreased balance;Decreased cognition;Decreased coordination;Decreased mobility;Decreased safety awareness;Decreased skin integrity;Pain       PT Treatment Interventions DME instruction;Gait training;Functional mobility training;Balance training;Therapeutic exercise;Therapeutic activities;Neuromuscular re-education;Patient/family education    PT Goals (Current goals can be found in the Care Plan section)  Acute Rehab PT Goals Patient Stated Goal: Daughter wants pt to go to [facility] PT Goal Formulation: With patient/family Time For Goal Achievement: 05/28/20 Potential to Achieve Goals: Good    Frequency Min 5X/week   Barriers to discharge Decreased caregiver support has caregivers on a drop in basis    Co-evaluation               AM-PAC PT "6 Clicks" Mobility  Outcome Measure Help needed turning from your back to your side while in a flat bed without using bedrails?: A Little Help needed moving from lying on your back to sitting on the side of a flat bed without using bedrails?: A Little Help needed moving to and from a bed to a chair (including a wheelchair)?: A Little Help needed standing up from a chair using your arms (e.g., wheelchair or bedside chair)?: A Little Help needed to walk in hospital  room?: A Little Help needed climbing 3-5 steps with a railing? : Total 6 Click Score: 16    End of Session Equipment Utilized During Treatment: Gait belt Activity Tolerance: Patient limited by fatigue Patient left: in bed;with call bell/phone within reach;with bed alarm set;with family/visitor present Nurse Communication: Mobility status PT Visit  Diagnosis: Unsteadiness on feet (R26.81);Muscle weakness (generalized) (M62.81);Repeated falls (R29.6);History of falling (Z91.81);Pain Pain - Right/Left: Left Pain - part of body: Shoulder    Time: 8472-0721 PT Time Calculation (min) (ACUTE ONLY): 43 min   Charges:   PT Evaluation $PT Eval Moderate Complexity: 1 Mod PT Treatments $Gait Training: 8-22 mins $Therapeutic Exercise: 8-22 mins       Ramond Dial 05/14/2020, 1:58 PM  Mee Hives, PT MS Acute Rehab Dept. Number: [place] and [place]

## 2020-05-14 NOTE — Progress Notes (Signed)
Rehab Admissions Coordinator Note:  Patient was screened by Cleatrice Burke for appropriateness for an Inpatient Acute Rehab Consult per therapy recommendations. Noted pt and family prefer [facility].  At this time, we are recommending Inpatient Rehab consult for further assessment. I will place order per protocol.  Cleatrice Burke RN MSN 05/14/2020, 3:46 PM  I can be reached at [phone].

## 2020-05-15 DIAGNOSIS — I5043 Acute on chronic combined systolic (congestive) and diastolic (congestive) heart failure: Secondary | ICD-10-CM | POA: Diagnosis not present

## 2020-05-15 DIAGNOSIS — I351 Nonrheumatic aortic (valve) insufficiency: Secondary | ICD-10-CM | POA: Diagnosis not present

## 2020-05-15 DIAGNOSIS — I428 Other cardiomyopathies: Secondary | ICD-10-CM | POA: Diagnosis not present

## 2020-05-15 DIAGNOSIS — I5023 Acute on chronic systolic (congestive) heart failure: Secondary | ICD-10-CM | POA: Diagnosis not present

## 2020-05-15 DIAGNOSIS — S42032A Displaced fracture of lateral end of left clavicle, initial encounter for closed fracture: Secondary | ICD-10-CM | POA: Diagnosis not present

## 2020-05-15 DIAGNOSIS — Z20822 Contact with and (suspected) exposure to covid-19: Secondary | ICD-10-CM | POA: Diagnosis not present

## 2020-05-15 LAB — BASIC METABOLIC PANEL
Anion gap: 10 (ref 5–15)
BUN: 19 mg/dL (ref 8–23)
CO2: 26 mmol/L (ref 22–32)
Calcium: 8.6 mg/dL — ABNORMAL LOW (ref 8.9–10.3)
Chloride: 105 mmol/L (ref 98–111)
Creatinine, Ser: 0.8 mg/dL (ref 0.44–1.00)
GFR, Estimated: >60 mL/min (ref >60)
Glucose, Bld: 92 mg/dL (ref 70–99)
Potassium: 4.3 mmol/L (ref 3.5–5.1)
Sodium: 141 mmol/L (ref 135–145)

## 2020-05-15 LAB — MAGNESIUM: Magnesium: 1.9 mg/dL (ref 1.7–2.4)

## 2020-05-15 MED ORDER — PREDNISOLONE ACETATE 1 % OP SUSP: 1.0000 [drp] | Freq: Two times a day (BID) | OPHTHALMIC | Status: DC

## 2020-05-15 MED ORDER — BROMFENAC SODIUM 0.07 % OP SOLN
1.0000 [drp] | Freq: Every day | OPHTHALMIC | Status: DC
  Administered 2020-05-16 – 2020-05-17 (×2): 1 [drp] via OPHTHALMIC

## 2020-05-15 MED ORDER — HYDROCODONE-ACETAMINOPHEN 5-325 MG PO TABS: 1.0000 | ORAL_TABLET | Freq: Four times a day (QID) | ORAL | Status: DC | PRN

## 2020-05-15 MED ORDER — FUROSEMIDE 40 MG PO TABS
40.0000 mg | ORAL_TABLET | Freq: Every day | ORAL | Status: DC
  Administered 2020-05-16 – 2020-05-17 (×2): 40 mg via ORAL
  Filled 2020-05-15 (×2): qty 1

## 2020-05-15 MED ORDER — NON FORMULARY: 1.0000 [drp] | Freq: Every day | Status: DC

## 2020-05-15 MED ORDER — PREDNISOLONE ACETATE 1 % OP SUSP: 1.0000 [drp] | Freq: Four times a day (QID) | OPHTHALMIC | Status: DC

## 2020-05-15 MED ORDER — PREDNISOLONE ACETATE 1 % OP SUSP: 1.0000 [drp] | Freq: Every day | OPHTHALMIC | Status: DC

## 2020-05-15 MED ORDER — MOXIFLOXACIN HCL 0.5 % OP SOLN
1.0000 [drp] | Freq: Three times a day (TID) | OPHTHALMIC | Status: DC
  Administered 2020-05-15 – 2020-05-17 (×6): 1 [drp] via OPHTHALMIC

## 2020-05-15 MED ORDER — NON FORMULARY: 1.0000 [drp] | Freq: Four times a day (QID) | Status: DC

## 2020-05-15 MED ORDER — PREDNISOLONE ACETATE 1 % OP SUSP
1.0000 [drp] | Freq: Three times a day (TID) | OPHTHALMIC | Status: DC
  Administered 2020-05-15 – 2020-05-17 (×6): 1 [drp] via OPHTHALMIC

## 2020-05-15 MED ORDER — FENTANYL CITRATE (PF) 100 MCG/2ML IJ SOLN: 12.5000 ug | INTRAMUSCULAR | Status: AC | PRN

## 2020-05-15 NOTE — PMR Pre-admission (Signed)
PMR Admission Coordinator Pre-Admission Assessment  Patient: Elizabeth Mccullough is an 79 y.o., female MRN: [id number] DOB: 1940-11-13 Height: _0  (157.5 cm) Weight: 59 kg  Insurance Information HMO: yes    PPO:      PCP:      IPA:      80/20:      OTHER:  PRIMARY: Blue Medicare      Policy#: [id number]      Subscriber: pt CM Name: Anderson Malta      Phone#: [phone]     Fax#: [phone] Pre-Cert#: tba approved for 7 days      Employer:  Benefits:  Phone #: [phone]     Name: 10/28 Eff. Date: 07/20/2019     Deduct: none      Out of Pocket Max: $4400      Life Max: none CIR: $335 co pay per admission      SNF: no copay per day days 1 until 20: $184 co pay per day days 21 until 60; no copay days 61 until 100 Outpatient: $40 per visit     Co-Pay: visits limited per medical neccesity Home Health: 100%      Co-Pay: visits per medical neccesity DME: 80%     Co-Pay: 20% Providers: in network  SECONDARY:       Policy#:      Phone#:   Development worker, community:       Phone#:   The Engineer, [employer]" for patients in Inpatient Rehabilitation Facilities with attached "Privacy Act [place] Records" was provided and verbally reviewed with: Family  Emergency Contact Information Contact Information    Name Relation Home Work [place] Jr,[place] [phone]  [phone]   Estil Daft [phone]  [phone]   Dudich,Kathy Daughter [phone]  [phone]      Current Medical History  Patient Admitting Diagnosis: falls, Parkinson's  History of Present Illness: [place] is an 79 year old right-handed female with history of hypertension, Parkinson's disease maintained on Sinemet, 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  Per chart review patient resides  independent living facility [facility] since March 2021.  Ambulates with a rolling walker but does endorse frequent falls.  She was able to bathe dress feet and told herself and goes to the dining hall for meals..  Daughter and family plan to hire assistance as needed.  Presented 05/13/2020 after falling 3 times on day of presentation.  Denied loss of consciousness.  Admission chemistries glucose 100, troponin 26>29, hemoglobin 10.6, urinalysis negative nitrite.  Cranial CT scan as well as CT cervical spine showed no acute intracranial pathology and no acute traumatic cervical spine pathology.  X-rays right knee no acute osseous abnormality.  Findings of comminuted displaced left clavicle fracture.  Follow-up orthopedic services Dr. Mardelle Matte in regards to left clavicle fracture also right knee contusion with conservative care and no surgical intervention.  Nonweightbearing left upper extremity with shoulder sling.  Cardiology service follow-up for history of CHF with latest chest x-ray showing probable left pleural effusion and left lung base atelectasis and echocardiogram completed showing ejection fraction 30 to 79% grade 1 diastolic dysfunction, moderate hypokinesis LV...  Patient currently remains on Lasix 40 mg daily as well as lisinopril with Zebeta 2.5 mg daily.  Subcutaneous heparin added for DVT prophylaxis.    Patient's medical record from [facility]  has been reviewed by the  rehabilitation admission coordinator and physician.  Past Medical History  Past Medical History:  Diagnosis Date  . AAA (abdominal aortic aneurysm) ([place])   . Carotid artery disease ([place]) 01/12/2017   Carotid US 6/18: Bilat < 50%; repeat in 12/2000  . Chronic combined systolic and diastolic CHF (congestive heart failure) ([place]) 03/01/2017   NICM // [place] 8/18: normal coronary arteries /  Echo 8/18: EF 20-25, mod AI, mod MR, PASP 32 // Echo 1/19: EF 55-60, no RWMA, Gr 1 DD, mod AI, mild MR, PASP 32  . Hyperlipidemia    . Parkinson's disease ([place])     Family History   family history includes Breast cancer in an other family member; Cancer in her sister; Cancer (age of onset: 28) in her sister; Colon cancer in an other family member; Diabetes in her daughter and sister; Healthy in her daughter; Heart disease in her mother; Hyperlipidemia in her brother, sister, and sister; Hypertension in her brother; Prostate cancer in her son; Stomach cancer in her daughter; Stroke in her brother.  Prior Rehab/Hospitalizations Has the patient had prior rehab or hospitalizations prior to admission? Yes  Has the patient had major surgery during 100 days prior to admission? No   Current Medications  Current Facility-Administered Medications:  .  acetaminophen (TYLENOL) tablet 650 mg, 650 mg, Oral, Q6H PRN, [place] Eric J, DO .  bisoprolol (ZEBETA) tablet 2.5 mg, 2.5 mg, Oral, Daily, Cox, Amy N, DO, 2.5 mg at 05/16/20 0932 .  Bromfenac Sodium 0.07 % SOLN 1 drop, 1 drop, Right Eye, Daily, [place] Donnamarie Poag, DO, 1 drop at 05/16/20 0936 .  Carbidopa-Levodopa ER (SINEMET CR) 25-100 MG tablet controlled release 2 tablet, 2 tablet, Oral, TID, Cox, Amy N, DO, 2 tablet at 05/16/20 0931 .  furosemide (LASIX) tablet 40 mg, 40 mg, Oral, Daily, Hilty, Nadean Corwin, MD, 40 mg at 05/16/20 0931 .  heparin injection 5,000 Units, 5,000 Units, Subcutaneous, Q8H, Cox, Amy N, DO, 5,000 Units at 05/16/20 1321 .  HYDROcodone-acetaminophen (NORCO/VICODIN) 5-325 MG per tablet 1 tablet, 1 tablet, Oral, Q6H PRN, [place] Eric J, DO .  [START ON 05/17/2020] lisinopril (ZESTRIL) tablet 10 mg, 10 mg, Oral, Daily, Hilty, Nadean Corwin, MD .  lisinopril (ZESTRIL) tablet 5 mg, 5 mg, Oral, Once, Hilty, Nadean Corwin, MD .  LORazepam (ATIVAN) tablet 0.5 mg, 0.5 mg, Oral, Q6H PRN, [place] Donnamarie Poag, DO, 0.5 mg at 05/15/20 1628 .  moxifloxacin (VIGAMOX) 0.5 % ophthalmic solution 1 drop, 1 drop, Right Eye, TID, [place] Donnamarie Poag, DO, 1 drop at 05/16/20 0937 .  pravastatin  (PRAVACHOL) tablet 40 mg, 40 mg, Oral, q1800, Cox, Amy N, DO, 40 mg at 05/15/20 1628 .  prednisoLONE acetate (PRED FORTE) 1 % ophthalmic suspension 1 drop, 1 drop, Right Eye, TID, 1 drop at 05/16/20 0937 **FOLLOWED BY** [START ON 05/22/2020] prednisoLONE acetate (PRED FORTE) 1 % ophthalmic suspension 1 drop, 1 drop, Right Eye, BID **FOLLOWED BY** [START ON 05/29/2020] prednisoLONE acetate (PRED FORTE) 1 % ophthalmic suspension 1 drop, 1 drop, Right Eye, Daily, [place] Eric J, DO .  sertraline (ZOLOFT) tablet 25 mg, 25 mg, Oral, Daily, Cox, Amy N, DO, 25 mg at 05/16/20 0932  Patients Current Diet:  Diet Order            Diet Heart Room service appropriate? Yes; Fluid consistency: Thin  Diet effective now                 Precautions / Restrictions Precautions Precautions: Fall Precaution Comments:  Per MD, ok to use walker but encourage pt to avoid pushing through L UE too much Restrictions Weight Bearing Restrictions: Yes LUE Weight Bearing: Non weight bearing Other Position/Activity Restrictions: NWB in sling, but ok to use walker without pushing through L UE too much   Has the patient had 2 or more falls or a fall with injury in the past year? Yes  Prior Activity Level Limited Community (1-2x/wk): Mod I with RW  Prior Functional Level Self Care: Did the patient need help bathing, dressing, using the toilet or eating? Independent  Indoor Mobility: Did the patient need assistance with walking from room to room (with or without device)? Independent  Stairs: Did the patient need assistance with internal or external stairs (with or without device)? Needed some help  Functional Cognition: Did the patient need help planning regular tasks such as shopping or remembering to take medications? Needed some help  Home Assistive Devices / [place] Devices/Equipment: Gilford Rile (specify type) (front wheel) Home Equipment: Walker - 4 wheels, Grab bars - toilet, Grab bars -  tub/shower, Shower seat, Bedside commode  Prior Device Use: Indicate devices/aids used by the patient prior to current illness, exacerbation or injury? Walker  Current Functional Level Cognition  Overall Cognitive Status: History of cognitive impairments - at baseline Difficult to assess due to: Level of arousal Current Attention Level: Sustained Orientation Level: Oriented to person, Oriented to place, Oriented to time, Disoriented to situation (orientation changes) Following Commands: Follows one step commands with increased time Safety/Judgement: Decreased awareness of safety, Decreased awareness of deficits General Comments: difficulty following multi-step commands, does not follow directions unless completing stopping task    Extremity Assessment (includes Sensation/Coordination)  Upper Extremity Assessment: LUE deficits/detail LUE Deficits / Details: clavicle fx with bruising around shoulder. Hand, wrist and elbow ROM WFL LUE: Unable to fully assess due to immobilization LUE Sensation: WNL LUE Coordination: decreased fine motor  Lower Extremity Assessment: Defer to PT evaluation    ADLs  Overall ADL's : Needs assistance/impaired Eating/Feeding: Set up, Sitting Grooming: Moderate assistance, Sitting, Oral care Grooming Details (indicate cue type and reason): Mod A overall for brushing teeth sitting EOB. Great difficulty problem solving, required max cues for sequencing. Use of sling helped deter pt from using L UE Upper Body Bathing: Moderate assistance, Sitting, Cueing for UE precautions Lower Body Bathing: Maximal assistance, Sit to/from stand Upper Body Dressing : Moderate assistance, Cueing for UE precautions, Sitting Upper Body Dressing Details (indicate cue type and reason): Mod A for sling mgmt  Lower Body Dressing: Maximal assistance, Sit to/from stand, Sitting/lateral leans Toilet Transfer: Minimal assistance, +2 for safety/equipment, +2 for physical assistance,  Ambulation, BSC, Regular Toilet Toilet Transfer Details (indicate cue type and reason): Min A x 2 to maintain balance, shuffling gait noted. Attempted use of IV pole, 1 and 2 person handheld assist, cueing to avoid L UE use Toileting- Clothing Manipulation and Hygiene: Moderate assistance, Sitting/lateral lean, Sit to/from stand Toileting - Clothing Manipulation Details (indicate cue type and reason): Mod A overall. pt able to get toilet paper, assist with clothing mgmt, but difficulty attending to L UE precaution Functional mobility during ADLs: Minimal assistance, +2 for physical assistance, +2 for safety/equipment, Rolling walker, Cueing for sequencing, Cueing for safety General ADL Comments: Pt with improved balance with RW (cleared by MD), noted with deficits in problem solving ADLs today    Mobility  Overal bed mobility: Needs Assistance Bed Mobility: Supine to Sit Supine to sit: Mod assist, HOB elevated  Sit to supine: Max assist General bed mobility comments: modA to scoot forward to EOB    Transfers  Overall transfer level: Needs assistance Equipment used: Rolling walker (2 wheeled) Transfers: Sit to/from Stand Sit to Stand: Min assist Stand pivot transfers: Min assist General transfer comment: minA for sit to stand, cues for using R UE to assist to standing rather than L     Ambulation / Gait / Stairs / Wheelchair Mobility  Ambulation/Gait Ambulation/Gait assistance: Herbalist (Feet): 8 Feet Assistive device: Rolling walker (2 wheeled) Gait Pattern/deviations: Step-to pattern, Decreased stride length, Shuffle, Wide base of support, Staggering right, Staggering left General Gait Details: during bwd walking, patient began to demo shuffle gait with posterior LOB which required minA to recover. Decreased safety awareness with patient unaware of posterior LOB and uncontrolled descent into chair Gait velocity: reduced Gait velocity interpretation: <1.8 ft/sec, indicate  of risk for recurrent falls    Posture / Balance Dynamic Sitting Balance Sitting balance - Comments: fair sitting balance EOB during ADLs Balance Overall balance assessment: History of Falls, Needs assistance Sitting-balance support: Feet supported Sitting balance-Leahy Scale: Fair Sitting balance - Comments: fair sitting balance EOB during ADLs Postural control: Posterior lean Standing balance support: Bilateral upper extremity supported, During functional activity Standing balance-Leahy Scale: Poor Standing balance comment: improved balance standing with walker at bedside    Special needs/care consideration Wears depends for urinary incontinence pta; has frequent UTIS Frequent falls pta with injuries AT ILF , caregiver support is al [employer] and family can hire caregivers to provide supervision as needed   Previous Home Environment  Living Arrangements: Alone, Other (Comment) (ILF at [facility] since March 2021. Has onsite emergen)  Lives With: Alone, Other (Comment) ([place]) Available Help at Discharge: Other (Comment) (daughter states family will hire assistance to be provided a) Type of Home: [place]: One level Home Access: Level entry Bathroom Shower/Tub: Multimedia programmer: Handicapped height [place] Accessibility: Yes How Accessible: Accessible via [place]:  (receivd PT, OT and SLP at [place]) Additional Comments: pt has staff from rehab observing her self care to give her feedback  Discharge Living Setting Plans for Discharge Living Setting: Other (Comment) ([place] since March 2021) Type of Home at Discharge: [place] Name at Discharge: [place] Discharge Home Layout: One level Discharge Home Access: Level entry Discharge Bathroom Shower/Tub: Walk-in shower Discharge Bathroom Toilet: Handicapped height Discharge Bathroom  Accessibility: Yes How Accessible: Accessible via walker Does the patient have any problems obtaining your medications?: No  Social/Family/Support Systems Contact Information: daughters and son Anticipated Caregiver: family to hire caregivers to be added at [place]: see above Caregiver Availability: 24/7 Discharge Plan Discussed with Primary Caregiver: Yes Is Caregiver In Agreement with Plan?: Yes Does Caregiver/Family have Issues with Lodging/Transportation while Pt is in Rehab?: No  Goals Patient/Family Goal for Rehab: supervision PT, supervision ot min OT, supervision SLP Expected length of stay: ELOS 2 weeks Pt/Family Agrees to Admission and willing to participate: Yes Program Orientation Provided & Reviewed with Pt/Caregiver Including Roles  & Responsibilities: Yes  Decrease burden of Care through IP rehab admission: n/a  Possible need for SNF placement upon discharge: yes if she does not reach level to return to [place] with hired caregivers for supervision  Patient Condition: I have reviewed medical records from [facility] , spoken with CM, and patient and daughter. I met  with patient at the bedside for inpatient rehabilitation assessment.  Patient will benefit from ongoing PT, OT and SLP, can actively participate in 3 hours of therapy a day 5 days of the week, and can make measurable gains during the admission.  Patient will also benefit from the coordinated team approach during an Inpatient Acute Rehabilitation admission.  The patient will receive intensive therapy as well as Rehabilitation physician, nursing, social worker, and care management interventions.  Due to bladder management, bowel management, safety, skin/wound care, disease management, medication administration, pain management and patient education the patient requires 24 hour a day rehabilitation nursing.  The patient is currently mod assist with mobility and basic ADLs.   Discharge setting and therapy post discharge at home with home health at [place] is anticipated.  Patient has agreed to participate in the Acute Inpatient Rehabilitation Program and will admit Saturday 05/17/2020.  Preadmission Screen Completed By:  Cleatrice Burke, 05/16/2020 4:27 PM ______________________________________________________________________   Discussed status with Dr. Naaman Plummer  on  05/16/2020 at  1600 and received approval for admission Saturday 05/17/2020.  Admission Coordinator:  Cleatrice Burke, RN, time 9030 Date  05/16/2020   Assessment/Plan: Diagnosis: left clavicle fx, gait disorder 1. Does the need for close, 24 hr/day Medical supervision in concert with the patient's rehab needs make it unreasonable for this patient to be served in a less intensive setting? Yes 2. Co-Morbidities requiring supervision/potential complications: PD, HTN, csdCHF 3. Due to bladder management, bowel management, safety, skin/wound care, disease management, medication administration, pain management and patient education, does the patient require 24 hr/day rehab nursing? Yes 4. Does the patient require coordinated care of a physician, rehab nurse, PT, OT, and SLP to address physical and functional deficits in the context of the above medical diagnosis(es)? Yes Addressing deficits in the following areas: balance, endurance, locomotion, strength, transferring, bowel/bladder control, bathing, dressing, feeding, grooming, toileting, cognition, speech and psychosocial support 5. Can the patient actively participate in an intensive therapy program of at least 3 hrs of therapy 5 days a week? Yes 6. The potential for patient to make measurable gains while on inpatient rehab is excellent 7. Anticipated functional outcomes upon discharge from inpatient rehab: supervision PT, supervision and min assist OT, supervision SLP 8. Estimated rehab length of stay to reach the above functional goals is: 14  days 9. Anticipated discharge destination: Home 10. Overall Rehab/Functional Prognosis: excellent   MD Signature: [name], MD, [place] 05/17/2020

## 2020-05-15 NOTE — Plan of Care (Signed)
  Problem: Education: Goal: Knowledge of General Education information will improve Description: Including pain rating scale, medication(s)/side effects and non-pharmacologic comfort measures Outcome: Progressing   Problem: Health Behavior/Discharge Planning: Goal: Ability to manage health-related needs will improve Outcome: Progressing   Problem: Clinical Measurements: Goal: Will remain free from infection Outcome: Progressing   

## 2020-05-15 NOTE — Progress Notes (Signed)
Physical Therapy Treatment Patient Details Name: Elizabeth Mccullough MRN: [id number] DOB: 1940-09-26 Today's Date: 05/15/2020    History of Present Illness 79 yo female with onset of many falls had one result in Fracture of the distal third left clavicle with slight inferior displacement of 1/3 shaft width, associated swelling, Moderate degenerative change of L acromioclavicular and glenohumeral joints.  PMHx:  lumbar DJD, CHF, Parkinson's, CHF, AAA, carotid artery disease, R knee meniscal repair,     PT Comments    Pt participated in session; Rehab RN present during session; pt requiring increased assist for coordination of bed mobility as well as to maintain balance; pt demonstrating shuffling pattern when attempting to side step; PT educated daughter on importance of additional therapy due to pt being a high fall risk; pt with increased anxiety during session requiring increased time sitting EOB to calm down to perform sit>stand transfer; pt demonstrating deficits in balance, strength, coordination, gait, endurance, safety and pain to maximize independence with functional mobility prior to discharge.     Follow Up Recommendations  CIR     Equipment Recommendations  None recommended by PT    Recommendations for Other Services       Precautions / Restrictions Precautions Precautions: Fall Precaution Comments: Pt has no sling in her room. Per MD, ok to use walker but ecnourage pt to avoid pushing through L UE too much Required Braces or Orthoses: Sling Restrictions Weight Bearing Restrictions: Yes LUE Weight Bearing: Non weight bearing Other Position/Activity Restrictions: NWB in sling    Mobility  Bed Mobility Overal bed mobility: Needs Assistance Bed Mobility: Supine to Sit;Sit to Supine     Supine to sit: Mod assist;HOB elevated Sit to supine: Max assist   General bed mobility comments: Mod A to scoot hips forward, cues to avoid pushing through L UE. Max A to return to  bed, heavy assist for B LE  Transfers Overall transfer level: Needs assistance   Transfers: Sit to/from Stand Sit to Stand: Min assist         General transfer comment: increased posterior lean; performed side stepping to improve alignment/positioning in bed; pt requiring visual target of head of bed to increase participation in side stepping and decrease shuffling  Ambulation/Gait                 Stairs             Wheelchair Mobility    Modified Rankin (Stroke Patients Only)       Balance Overall balance assessment: History of Falls;Needs assistance Sitting-balance support: Feet supported;Single extremity supported Sitting balance-Leahy Scale: Fair   Postural control: Posterior lean Standing balance support: Single extremity supported Standing balance-Leahy Scale: Poor Standing balance comment: posterior lean initially, improved wtih continued standing                            Cognition                                              Exercises      General Comments        Pertinent Vitals/Pain Pain Assessment: 0-10 Pain Score: 4  Pain Location: upper back    Home Living  Prior Function            PT Goals (current goals can now be found in the care plan section) Acute Rehab PT Goals Patient Stated Goal: Daughter wants pt to go to CIR PT Goal Formulation: With patient/family Time For Goal Achievement: 05/28/20 Potential to Achieve Goals: Good Progress towards PT goals: Progressing toward goals    Frequency    Min 5X/week      PT Plan Current plan remains appropriate    Co-evaluation              AM-PAC PT "6 Clicks" Mobility   Outcome Measure  Help needed turning from your back to your side while in a flat bed without using bedrails?: A Little Help needed moving from lying on your back to sitting on the side of a flat bed without using bedrails?: A Lot Help  needed moving to and from a bed to a chair (including a wheelchair)?: A Little Help needed standing up from a chair using your arms (e.g., wheelchair or bedside chair)?: A Little Help needed to walk in hospital room?: A Lot Help needed climbing 3-5 steps with a railing? : Total 6 Click Score: 14    End of Session Equipment Utilized During Treatment: Gait belt Activity Tolerance: Patient tolerated treatment well Patient left: in bed;with call bell/phone within reach;with bed alarm set;with family/visitor present Nurse Communication: Mobility status PT Visit Diagnosis: Unsteadiness on feet (R26.81);Muscle weakness (generalized) (M62.81);Repeated falls (R29.6);History of falling (Z91.81);Pain Pain - Right/Left: Left Pain - part of body: Shoulder     Time: 1201-1224 PT Time Calculation (min) (ACUTE ONLY): 23 min  Charges:  $Therapeutic Activity: 23-37 mins                     Lyanne Co, DPT Acute Rehabilitation Services [phone]   [place] 05/15/2020, 1:21 PM

## 2020-05-15 NOTE — Progress Notes (Addendum)
CCMD reported a ST elevation, pt vtals are WDL, pt resting in room with no complaints. Cardiologist paged, awaiting new orders.  EKG ordered. Cardiologist notified.

## 2020-05-15 NOTE — Progress Notes (Signed)
Progress Note  Patient Name: Elizabeth Mccullough Date of Encounter: 05/15/2020  [facility] Cardiologist: Mertie Moores, MD   Subjective   Pt isn't sleeping well. Unclear if breathing is improved.  Inpatient Medications    Scheduled Meds: . bisoprolol  2.5 mg Oral Daily  . Carbidopa-Levodopa ER  2 tablet Oral TID  . furosemide  20 mg Oral Daily  . heparin injection (subcutaneous)  5,000 Units Subcutaneous Q8H  . lisinopril  2.5 mg Oral Daily  . potassium chloride  40 mEq Oral BID  . pravastatin  40 mg Oral q1800  . sertraline  25 mg Oral Daily   Continuous Infusions:  PRN Meds: acetaminophen, fentaNYL (SUBLIMAZE) injection, LORazepam   Vital Signs    Vitals:   05/14/20 1336 05/14/20 1802 05/14/20 2010 05/15/20 0418  BP: 129/64 134/69 (!) 121/56 104/89  Pulse: 70  75 73  Resp: 17  16 17   Temp: 97.6 F (36.4 C) 99.2 F (37.3 C) 98.6 F (37 C) (!) 97.4 F (36.3 C)  TempSrc: Oral  Oral Oral  SpO2: 99%  95% 95%  Weight:      Height:        Intake/Output Summary (Last 24 hours) at 05/15/2020 0819 Last data filed at 05/14/2020 1300 Gross per 24 hour  Intake 850.24 ml  Output --  Net 850.24 ml   Last 3 Weights 05/13/2020 02/05/2020 12/18/2019  Weight (lbs) 130 lb 131 lb 9.6 oz 128 lb 4.9 oz  Weight (kg) 58.968 kg 59.693 kg 58.2 kg      Telemetry    Sinus rhythm 60-80s - Personally Reviewed  ECG    No new tracings - Personally Reviewed  Physical Exam   GEN: elderly female in no acute distress.   Neck: mild JVD Cardiac: RRR, + 2/6 murmur Respiratory: Clear to auscultation bilaterally. GI: Soft, nontender, non-distended  MS: No edema; No deformity. Neuro:  Nonfocal  Psych: Normal affect   Labs    High Sensitivity Troponin:   Recent Labs  Lab 05/13/20 1854 05/13/20 2102  TROPONINIHS 26* 29*      Chemistry Recent Labs  Lab 05/13/20 1854 05/14/20 0316 05/15/20 0348  NA 142 141 141  K 3.5 3.1* 4.3  CL 105 104 105  CO2 26 30 26    GLUCOSE 100* 90 92  BUN 21 17 19   CREATININE 0.88 0.80 0.80  CALCIUM 9.0 8.8* 8.6*  PROT 6.4*  --   --   ALBUMIN 3.8  --   --   AST 24  --   --   ALT 13  --   --   ALKPHOS 62  --   --   BILITOT 0.8  --   --   GFRNONAA >60 >60 >60  ANIONGAP 11 7 10      Hematology Recent Labs  Lab 05/13/20 1854 05/14/20 0316  WBC 7.9 6.4  RBC 3.38* 3.07*  HGB 10.6* 9.9*  HCT 33.6* 30.1*  MCV 99.4 98.0  MCH 31.4 32.2  MCHC 31.5 32.9  RDW 12.9 12.9  PLT 329 294    BNPNo results for input(s): BNP, PROBNP in the last 168 hours.   DDimer No results for input(s): DDIMER in the last 168 hours.   Radiology    DG Chest 2 View  Result Date: 05/13/2020 CLINICAL DATA:  79 year old female with left rib pain. EXAM: CHEST - 2 VIEW COMPARISON:  Chest radiograph dated 11/27/2019. FINDINGS: There is blunting of the left costophrenic angle with flattening of the  left hemidiaphragm which may represent trace left pleural effusion and left lung base atelectasis. Pneumonia is not excluded clinical correlation is recommended. The right lung is clear. No pneumothorax. Mild cardiomegaly. There is widening of the upper mediastinum similar to prior radiograph. Atherosclerotic calcification of the aorta. No acute osseous pathology. IMPRESSION: Probable trace left pleural effusion and left lung base atelectasis. Pneumonia is not excluded. Electronically Signed   By: Anner Crete M.D.   On: 05/13/2020 18:51   DG Clavicle Left  Result Date: 05/13/2020 CLINICAL DATA:  Fall, clavicular pain EXAM: LEFT CLAVICLE - 2+ VIEWS COMPARISON:  Chest radiograph 05/13/2020 FINDINGS: Fracture of the distal third left clavicle with slight inferior displacement of 1/3 shaft width. Minimal foreshortening across the fracture line. No other acute osseous abnormality is seen. Overlying soft tissue swelling. Acromioclavicular and glenohumeral alignment appears maintained. Background of moderate degenerative change at the acromioclavicular  and glenohumeral joints. Mineralization seen about the inferior glenohumeral recess may reflect small joint body. IMPRESSION: 1. Fracture of the distal third left clavicle with slight inferior displacement of 1/3 shaft width. Associated swelling. 2. Moderate degenerative change of the acromioclavicular and glenohumeral joints. Electronically Signed   By: Lovena Le M.D.   On: 05/13/2020 19:36   CT Head Wo Contrast  Result Date: 05/13/2020 CLINICAL DATA:  79 year old female with head trauma. EXAM: CT HEAD WITHOUT CONTRAST CT CERVICAL SPINE WITHOUT CONTRAST TECHNIQUE: Multidetector CT imaging of the head and cervical spine was performed following the standard protocol without intravenous contrast. Multiplanar CT image reconstructions of the cervical spine were also generated. COMPARISON:  CT dated 04/30/2020. FINDINGS: CT HEAD FINDINGS Brain: Mild age-related atrophy and chronic microvascular ischemic changes. There is no acute intracranial hemorrhage. No mass effect midline shift. No extra-axial fluid collection. Vascular: No hyperdense vessel or unexpected calcification. Skull: Normal. Negative for fracture or focal lesion. Sinuses/Orbits: No acute finding. Other: None CT CERVICAL SPINE FINDINGS Alignment: No acute subluxation. Skull base and vertebrae: No acute cervical spine fracture. Osteopenia. Soft tissues and spinal canal: No prevertebral fluid or swelling. No visible canal hematoma. Disc levels: Multilevel degenerative changes with endplate irregularity and disc space narrowing. Multilevel facet arthropathy. Upper chest: Negative. Other: Partially visualized comminuted and displaced fracture of the left clavicle. IMPRESSION: 1. No acute intracranial pathology. Mild age-related atrophy and chronic microvascular ischemic changes. 2. No acute/traumatic cervical spine pathology. Multilevel degenerative changes. 3. Partially visualized comminuted and displaced fracture of the left clavicle. Electronically  Signed   By: Anner Crete M.D.   On: 05/13/2020 19:38   CT Cervical Spine Wo Contrast  Result Date: 05/13/2020 CLINICAL DATA:  79 year old female with head trauma. EXAM: CT HEAD WITHOUT CONTRAST CT CERVICAL SPINE WITHOUT CONTRAST TECHNIQUE: Multidetector CT imaging of the head and cervical spine was performed following the standard protocol without intravenous contrast. Multiplanar CT image reconstructions of the cervical spine were also generated. COMPARISON:  CT dated 04/30/2020. FINDINGS: CT HEAD FINDINGS Brain: Mild age-related atrophy and chronic microvascular ischemic changes. There is no acute intracranial hemorrhage. No mass effect midline shift. No extra-axial fluid collection. Vascular: No hyperdense vessel or unexpected calcification. Skull: Normal. Negative for fracture or focal lesion. Sinuses/Orbits: No acute finding. Other: None CT CERVICAL SPINE FINDINGS Alignment: No acute subluxation. Skull base and vertebrae: No acute cervical spine fracture. Osteopenia. Soft tissues and spinal canal: No prevertebral fluid or swelling. No visible canal hematoma. Disc levels: Multilevel degenerative changes with endplate irregularity and disc space narrowing. Multilevel facet arthropathy. Upper chest: Negative. Other: Partially visualized  comminuted and displaced fracture of the left clavicle. IMPRESSION: 1. No acute intracranial pathology. Mild age-related atrophy and chronic microvascular ischemic changes. 2. No acute/traumatic cervical spine pathology. Multilevel degenerative changes. 3. Partially visualized comminuted and displaced fracture of the left clavicle. Electronically Signed   By: Anner Crete M.D.   On: 05/13/2020 19:38   DG Knee Complete 4 Views Right  Result Date: 05/13/2020 CLINICAL DATA:  Fall right-sided knee pain with bruising EXAM: RIGHT KNEE - COMPLETE 4+ VIEW COMPARISON:  08/24/2019 FINDINGS: No acute displaced fracture or malalignment. Moderate tricompartment arthritis of  the knee. No significant effusion IMPRESSION: No acute osseous abnormality. Electronically Signed   By: Donavan Foil M.D.   On: 05/13/2020 18:52   ECHOCARDIOGRAM COMPLETE  Result Date: 05/14/2020    ECHOCARDIOGRAM REPORT   Patient Name:   Elizabeth Mccullough Date of Exam: 05/14/2020 Medical Rec #:  [id number]            Height:       62.0 in Accession #:    [id number]           Weight:       130.0 lb Date of Birth:  1941-04-22            BSA:          1.592 m Patient Age:    58 years             BP:           133/62 mmHg Patient Gender: F                    HR:           73 bpm. Exam Location:  Inpatient Procedure: 2D Echo Indications:    syncope 780.2  History:        Patient has prior history of Echocardiogram examinations, most                 recent 08/11/2017. Abdominal aortic aneurysm; Risk                 Factors:Dyslipidemia.  Sonographer:    Johny Chess Referring Phys: [id number] AMY N [facility] IMPRESSIONS  1. Left ventricular ejection fraction, by estimation, is 30 to 35%. The left ventricle has moderately decreased function. The left ventricle demonstrates regional wall motion abnormalities (see scoring diagram/findings for description). Left ventricular  diastolic parameters are consistent with Grade I diastolic dysfunction (impaired relaxation). There is moderate hypokinesis of the left ventricular, entire anteroseptal wall, inferoseptal wall and inferior wall. Hypokinesis of the septal/inferior walls,  preserved at apex.  2. Right ventricular systolic function is normal. The right ventricular size is normal. There is normal pulmonary artery systolic pressure.  3. Left atrial size was severely dilated.  4. The mitral valve is normal in structure. Mild mitral valve regurgitation.  5. The aortic valve is tricuspid. There is mild calcification of the aortic valve. There is mild thickening of the aortic valve. Aortic valve regurgitation is moderate to severe. Mild to moderate aortic valve  sclerosis/calcification is present, without any evidence of aortic stenosis. Comparison(s): Prior images reviewed side by side. Changes from prior study are noted. Conclusion(s)/Recommendation(s): I reviewed 2019 study side by side. On my read, there was mild-moderate LVEF reduction with mild hypokinesis of the septal and inferior walls in 2019. These abnormalities are now more prominent/severe on current study. Aortic regurgitation moderate-severe. Findings communicated with attending Dr. [place] at 1:44 PM via secure chat. FINDINGS  Left Ventricle: Left ventricular ejection fraction, by estimation, is 30 to 35%. The left ventricle has moderately decreased function. The left ventricle demonstrates regional wall motion abnormalities. Moderate hypokinesis of the left ventricular, entire anteroseptal wall, inferoseptal wall and inferior wall. The left ventricular internal cavity size was normal in size. There is no left ventricular hypertrophy. Abnormal (paradoxical) septal motion, consistent with left bundle branch block. Left ventricular diastolic parameters are consistent with Grade I diastolic dysfunction (impaired relaxation).  LV Wall Scoring: Hypokinesis of the septal/inferior walls, preserved at apex. Right Ventricle: The right ventricular size is normal. Right vetricular wall thickness was not well visualized. Right ventricular systolic function is normal. There is normal pulmonary artery systolic pressure. The tricuspid regurgitant velocity is 2.79 m/s, and with an assumed right atrial pressure of 3 mmHg, the estimated right ventricular systolic pressure is 61.6 mmHg. Left Atrium: Left atrial size was severely dilated. Right Atrium: Right atrial size was normal in size. Pericardium: There is no evidence of pericardial effusion. Mitral Valve: The mitral valve is normal in structure. Mild mitral valve regurgitation. Tricuspid Valve: The tricuspid valve is normal in structure. Tricuspid valve regurgitation is  mild . No evidence of tricuspid stenosis. Aortic Valve: The aortic valve is tricuspid. There is mild calcification of the aortic valve. There is mild thickening of the aortic valve. Aortic valve regurgitation is moderate to severe. Aortic regurgitation PHT measures 266 msec. Mild to moderate aortic valve sclerosis/calcification is present, without any evidence of aortic stenosis. Pulmonic Valve: The pulmonic valve was not well visualized. Pulmonic valve regurgitation is trivial. No evidence of pulmonic stenosis. Aorta: The aortic root, ascending aorta, aortic arch and descending aorta are all structurally normal, with no evidence of dilitation or obstruction. IAS/Shunts: The atrial septum is grossly normal.  LEFT VENTRICLE PLAX 2D LVIDd:         4.90 cm      Diastology LVIDs:         3.50 cm      LV e' medial:    4.79 cm/s LV PW:         1.00 cm      LV E/e' medial:  15.1 LV IVS:        1.00 cm      LV e' lateral:   5.66 cm/s LVOT diam:     1.80 cm      LV E/e' lateral: 12.8 LV SV:         51 LV SV Index:   32 LVOT Area:     2.54 cm  LV Volumes (MOD) LV vol d, MOD A2C: 120.0 ml LV vol d, MOD A4C: 104.0 ml LV vol s, MOD A2C: 70.7 ml LV vol s, MOD A4C: 79.7 ml LV SV MOD A2C:     49.3 ml LV SV MOD A4C:     104.0 ml LV SV MOD BP:      38.3 ml RIGHT VENTRICLE             IVC RV S prime:     14.00 cm/s  IVC diam: 1.10 cm TAPSE (M-mode): 2.3 cm LEFT ATRIUM             Index       RIGHT ATRIUM           Index LA diam:        2.90 cm 1.82 cm/m  RA Area:     14.60 cm LA Vol (A2C):   80.8 ml 50.76 ml/m RA Volume:  34.20 ml  21.48 ml/m LA Vol (A4C):   84.0 ml 52.77 ml/m LA Biplane Vol: 86.3 ml 54.21 ml/m  AORTIC VALVE LVOT Vmax:   115.00 cm/s LVOT Vmean:  72.500 cm/s LVOT VTI:    0.199 m AI PHT:      266 msec  AORTA Ao Root diam: 2.90 cm MITRAL VALVE                TRICUSPID VALVE MV Area (PHT): 3.42 cm     TR Peak grad:   31.1 mmHg MV Decel Time: 222 msec     TR Vmax:        279.00 cm/s MV E velocity: 72.40 cm/s MV A  velocity: 115.00 cm/s  SHUNTS MV E/A ratio:  0.63         Systemic VTI:  0.20 m                             Systemic Diam: 1.80 cm Buford Dresser MD Electronically signed by Buford Dresser MD Signature Date/Time: 05/14/2020/1:44:42 PM    Final    DG Hip Unilat W or Wo Pelvis 1 View Right  Result Date: 05/13/2020 CLINICAL DATA:  79 year old female with right hip pain. Fall. EXAM: DG HIP (WITH OR WITHOUT PELVIS) 1V RIGHT COMPARISON:  Right hip radiograph dated 04/30/2020. FINDINGS: There is no acute fracture or dislocation. The bones are osteopenic. Moderate bilateral hip osteoarthritic changes. There is degenerative changes of the lower lumbar spine. The soft tissues are unremarkable. IMPRESSION: Negative. Electronically Signed   By: Anner Crete M.D.   On: 05/13/2020 18:59    Cardiac Studies   Echo 05/14/20: 1. Left ventricular ejection fraction, by estimation, is 30 to 35%. The  left ventricle has moderately decreased function. The left ventricle  demonstrates regional wall motion abnormalities (see scoring  diagram/findings for description). Left ventricular  diastolic parameters are consistent with Grade I diastolic dysfunction  (impaired relaxation). There is moderate hypokinesis of the left  ventricular, entire anteroseptal wall, inferoseptal wall and inferior  wall. Hypokinesis of the septal/inferior walls,  preserved at apex.  2. Right ventricular systolic function is normal. The right ventricular  size is normal. There is normal pulmonary artery systolic pressure.  3. Left atrial size was severely dilated.  4. The mitral valve is normal in structure. Mild mitral valve  regurgitation.  5. The aortic valve is tricuspid. There is mild calcification of the  aortic valve. There is mild thickening of the aortic valve. Aortic valve  regurgitation is moderate to severe. Mild to moderate aortic valve  sclerosis/calcification is present, without  any evidence of  aortic stenosis.   Patient Profile     79 y.o. female with a hx of NICM in 2018 with EF 20-25%, moderate MR and mod AR, EF improved with medication and 07/2017 EF 55-60%, Parkinson's disease, fusiform ascending aneurysm and mild carotid disease who is being seen for the evaluation of CHF and abnormal Echo.   Assessment & Plan    NICM Acute on chronic systolic and diastolic heart failure - losartan stopped in July 2021 due to marginal pressure - echo this admission with EF 30-35% with regional WMA - normal coronaries by heart cath in 2018 - diuresing on lasix 20 mg IV daily - continue bisoprolol, 2.5 mg lisinopril - BP in the 120-130s yesterday, now 100s - I&Os only reflect 300 out yesterday, suspect strict I&Os have not been charted - weight 130  lbs on 10/26 - need repeat weight - unclear what precipitated her reduction in EF - GCMT titration will be difficult given her marginal pressure and propensity for orthostatic hypotension and parkinson's   Hyperlipidemia - 09/17/2019: Cholesterol 166; HDL 57.60; LDL Cholesterol 89; Triglycerides 95.0; VLDL 19.0 - continue    Fusiform ascending aortic aneurysm - not appreciated on echo this admission   Anemia - Hb 9.9 (10.6) , per primary - could be contributing to her dizziness   Moderate to severe aortic insufficiency - no AS - murmur on exam - could be contributing to her dyspnea one exertion - likely a poor surgical candidate - will discuss with Dr. Debara Pickett if structural heart team is an option   Parkinson's - contributing to autonomic dysfunction --> orthostatic hypotension and falls      For questions or updates, please contact [facility] Please consult [email or web address] for contact info under        Signed, Ledora Bottcher, PA  05/15/2020, 8:19 AM

## 2020-05-15 NOTE — Progress Notes (Signed)
Occupational Therapy Treatment Patient Details Name: Elizabeth Mccullough MRN: [id number] DOB: [birth year] Today's Date: 05/15/2020    History of present illness 79 yo female with onset of many falls had one result in Fracture of the distal third left clavicle with slight inferior displacement of 1/3 shaft width, associated swelling, Moderate degenerative change of L acromioclavicular and glenohumeral joints.  PMHx:  lumbar DJD, CHF, Parkinson's, CHF, AAA, carotid artery disease, R knee meniscal repair,    OT comments  Pt working towards OT goals. Pt noted with decreased attempts to use L UE with sling now in place. Trialed RW after clearance from MD, with pt reporting her balance feels better with walker. Pt overall Min A for side stepping at EOB with RW and very little weight placed in L UE. Pt reported fatigue and requested to trial further mobility later. Pt completed ADLs sitting EOB with difficulty problem solving and sequencing noted, requiring overall Mod A to brush teeth with consistent cues needed to effectively complete task. Plan to progress mobility and problem solving during ADLs to increase independence.    Follow Up Recommendations  CIR;Supervision/Assistance - 24 hour;Other (comment) (SNF if unable to go to CIR)    Equipment Recommendations  Wheelchair (measurements OT)    Recommendations for Other Services Rehab consult    Precautions / Restrictions Precautions Precautions: Fall Precaution Comments: Per MD, ok to use walker but encourage pt to avoid pushing through L UE too much Required Braces or Orthoses: Sling Restrictions Weight Bearing Restrictions: Yes LUE Weight Bearing: Non weight bearing Other Position/Activity Restrictions: NWB in sling, but ok to use walker without pushing through L UE too much       Mobility Bed Mobility Overal bed mobility: Needs Assistance Bed Mobility: Supine to Sit;Sit to Supine     Supine to sit: Mod assist;HOB elevated Sit to  supine: Max assist   General bed mobility comments: Mod A to scoot hips forward, improved sequencing in reaching for bedrail with unaffected UE. Max A to return to bed, difficulty initiating and required assist for B LE  Transfers Overall transfer level: Needs assistance Equipment used: Rolling walker (2 wheeled) Transfers: Sit to/from Stand Sit to Stand: Min assist         General transfer comment: Min A for sit to stand in power up, noted not much weight put through L UE on walker. Min A for manuvering DME during side steps at bedside    Balance Overall balance assessment: History of Falls;Needs assistance Sitting-balance support: Feet supported Sitting balance-Leahy Scale: Fair Sitting balance - Comments: fair sitting balance EOB during ADLs Postural control: Posterior lean Standing balance support: Bilateral upper extremity supported;During functional activity Standing balance-Leahy Scale: Poor Standing balance comment: improved balance standing with walker at bedside                           ADL either performed or assessed with clinical judgement   ADL Overall ADL's : Needs assistance/impaired     Grooming: Moderate assistance;Sitting;Oral care Grooming Details (indicate cue type and reason): Mod A overall for brushing teeth sitting EOB. Great difficulty problem solving, required max cues for sequencing. Use of sling helped deter pt from using L UE         Upper Body Dressing : Moderate assistance;Cueing for UE precautions;Sitting Upper Body Dressing Details (indicate cue type and reason): Mod A for sling mgmt  Functional mobility during ADLs: Minimal assistance;+2 for physical assistance;+2 for safety/equipment;Rolling walker;Cueing for sequencing;Cueing for safety General ADL Comments: Pt with improved balance with RW (cleared by MD), noted with deficits in problem solving ADLs today     Vision   Vision Assessment?: No apparent  visual deficits   Perception     Praxis      Cognition Arousal/Alertness: Awake/alert Behavior During Therapy: Flat affect Overall Cognitive Status: History of cognitive impairments - at baseline Area of Impairment: Orientation;Attention;Memory;Following commands;Safety/judgement;Awareness;Problem solving                 Orientation Level: Place Current Attention Level: Sustained Memory: Decreased recall of precautions;Decreased short-term memory Following Commands: Follows one step commands with increased time Safety/Judgement: Decreased awareness of safety;Decreased awareness of deficits Awareness: Intellectual Problem Solving: Slow processing;Decreased initiation;Difficulty sequencing;Requires verbal cues;Requires tactile cues General Comments: Pt with difficulty problem solving multi step ADL tasks today,         Exercises     Shoulder Instructions       General Comments Pt reports feeling better using walker, improved balance though still with shuffling gait    Pertinent Vitals/ Pain       Pain Assessment: Faces Pain Score: 4  Faces Pain Scale: Hurts little more Pain Location: L shoulder Pain Descriptors / Indicators: Pressure Pain Intervention(s): Limited activity within patient's tolerance;Monitored during session;Repositioned  Home Living   Living Arrangements: Alone;Other (Comment) (ILF at [facility] since March 2021. Has onsite emergen) Available Help at Discharge: Other (Comment) (daughter states family will hire assistance to be provided a) Type of Home: Independent living facility Home Access: Level entry     Home Layout: One level     Bathroom Shower/Tub: Occupational psychologist: Handicapped height Bathroom Accessibility: Yes How Accessible: Accessible via walker        Lives With: Alone;Other (Comment) ([place])    Prior Functioning/Environment              Frequency  Min 2X/week        Progress  Toward Goals  OT Goals(current goals can now be found in the care plan section)  Progress towards OT goals: Progressing toward goals  Acute Rehab OT Goals Patient Stated Goal: Daughter wants pt to go to CIR OT Goal Formulation: With patient/family Time For Goal Achievement: 05/28/20 Potential to Achieve Goals: Good ADL Goals Pt Will Perform Grooming: with modified independence;sitting Pt Will Perform Upper Body Dressing: with set-up;sitting Pt Will Perform Lower Body Dressing: with min guard assist;sit to/from stand;sitting/lateral leans Pt Will Transfer to Toilet: with supervision;ambulating;bedside commode Pt Will Perform Toileting - Clothing Manipulation and hygiene: with min guard assist;sit to/from stand;sitting/lateral leans Additional ADL Goal #1: Pt to verbalize precautions for L UE without verbal cues in order to maximize healing and safety during ADLs  Plan Discharge plan remains appropriate    Co-evaluation                 AM-PAC OT "6 Clicks" Daily Activity     Outcome Measure   Help from another person eating meals?: A Little Help from another person taking care of personal grooming?: A Lot Help from another person toileting, which includes using toliet, bedpan, or urinal?: A Lot Help from another person bathing (including washing, rinsing, drying)?: A Lot Help from another person to put on and taking off regular upper body clothing?: A Lot Help from another person to put on and taking off regular lower body  clothing?: A Lot 6 Click Score: 13    End of Session Equipment Utilized During Treatment: Rolling walker;Other (comment) (sling)  OT Visit Diagnosis: Unsteadiness on feet (R26.81);Other abnormalities of gait and mobility (R26.89);Repeated falls (R29.6);Muscle weakness (generalized) (M62.81);Ataxia, unspecified (R27.0);Pain Pain - Right/Left: Left Pain - part of body: Shoulder   Activity Tolerance Patient limited by fatigue   Patient Left in bed;with  call bell/phone within reach;with bed alarm set;Other (comment) (floor mat in place)   Nurse Communication Mobility status;Weight bearing status;Other (comment) (ok to use walker)        Time: 6191-5502 OT Time Calculation (min): 32 min  Charges: OT General Charges $OT Visit: 1 Visit OT Treatments $Self Care/Home Management : 23-37 mins  Layla Maw, OTR/L   Layla Maw 05/15/2020, 2:38 PM

## 2020-05-15 NOTE — Progress Notes (Signed)
Paged by nursing that telemetry has reported ST elevation. Pt denies chest pain, no change in vital signs. Will wait for the pending 12-lead EKG. Prior EKGs reviewed with [facility].

## 2020-05-15 NOTE — Progress Notes (Signed)
PROGRESS NOTE    Elizabeth Mccullough  SKA:[id number] DOB: 1940-09-05 DOA: 05/13/2020 PCP: Ann Held, DO    Brief Narrative:  Elizabeth Mccullough is a 79 year old female with past medical history notable for essential hypertension, Parkinson's disease, combined chronic systolic/diastolic congestive heart failure, hyperlipidemia, fusiform ascending aortic aneurysm, carotid artery disease, moderate aortic insufficiency with mild dilation of the aortic root who presented from her group home with recurrent falls.  Patient denies loss of consciousness or dizziness spells during these events.  No history of seizures or seizure-like activity, denies any headache or vision changes.  No chest pain shortness of breath or weakness.  In the ED, CT head with and without contrast negative for acute intracranial abnormality but with mild age-related atrophy and chronic microvascular ischemic changes.  X-ray left clavicle notable for comminuted displaced fracture.  Hospitalist service were consulted for further evaluation and treatment of recurrent falls and pain control regarding her clavicle fracture.   Assessment & Plan:   Principal Problem:   Displaced fracture of lateral end of left clavicle, initial encounter for closed fracture Active Problems:   Hyperlipidemia   Falls frequently   Memory deficit   Dizziness   Fall   Clavicle fracture   Recurrent falls Patient presenting with multiple falls over the previous week.  No loss of consciousness.  CT head without contrast negative for acute intracranial abnormality but does note chronic atrophy and microvascular ischemic changes.  Etiology likely secondary to gait disturbance with underlying Parkinson's disease. --PT/OT recommends CIR --Awaiting insurance authorization for CIR --Continue monitor on telemetry for arrhythmia  Left clavicle fracture, closed Patient presenting with fall as above.  X-ray notable for comminuted/place  fracture left clavicle.  No evidence of neurovascular compromise, no respiratory compromise, no floating shoulder or tenting of the skin above her fracture. --Orthopedics consulted, Dr. Mardelle Matte --Continue shoulder immobilizer --Hydrocodone-acetaminophen 5-325 mg q6h prn moderate pain --Fentanyl 12.5 mg IV q4h prn severe breakthrough pain --Outpatient follow-up with Dr. 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 Acie Fredrickson. Previous TTE with LVEF 55-60% 2019.  Repeat TTE 05/14/2020 with decreased LVEF to 30-35% and LV moderately decreased function, grade 1 diastolic dysfunction, moderate hypokinesis LV, anterior septal wall, inferior septal wall, inferior wall, RV systolic function normal, LA severely dilated, mild MR, moderate/severe AR --Cardiology following, appreciated assistance --High-sensitivity troponin 26>29 --Bisoprolol 2.5 mg p.o. daily --Lisinopril 2.5 mg p.o. daily --Increased Lasix to 40 mg p.o. daily --Strict I's and O's and daily weights  Hypokalemia --Potassium 4.3 today --Repeat electrolytes in a.m. to include magnesium  Essential hypertension --Bisoprolol 2.5 mg p.o. daily --Lisinopril 2.5 mg p.o. daily --Lasix 20 mg p.o. daily  Hyperlipidemia: Continue pravastatin 40 mg p.o. daily  Depression: Continue sertraline 25 mg p.o. daily  Parkinson's disease: Continue carbidopa-levodopa ER 50-200 mg TID, outpatient follow-up with neurology Dr. Rexene Alberts  Chronic normocytic anemia Baseline hemoglobin last 5 months 9.3-10.1.  Hemoglobin on admission 10.6, at baseline.  Fusiform thoracic aneurysm ascending aorta, chronic and stable Previous diameter in 2019 4.1 x 4.3 cm.  Last seen by cardiothoracic surgery, Dr. Prescott Gum 01/11/2018.  X-ray notable for widening of the mediastinum that appears unchanged from previous x-rays. --Continue blood pressure control as  above --Outpatient follow-up   DVT prophylaxis: Heparin Code Status: Full code Family Communication: Updated daughter who is present at bedside this morning  Disposition Plan:  Status is: Inpatient  Remains inpatient appropriate because:Persistent severe electrolyte  disturbances, Ongoing diagnostic testing needed not appropriate for outpatient work up, Unsafe d/c plan, IV treatments appropriate due to intensity of illness or inability to take PO and Inpatient level of care appropriate due to severity of illness   Dispo: The patient is from: Home              Anticipated d/c is to: CIR              Anticipated d/c date is: 1 day              Patient currently is not medically stable to d/c.    Consultants:   Cardiology  Procedures:  TTE 05/14/2020: IMPRESSIONS    1. Left ventricular ejection fraction, by estimation, is 30 to 35%. The  left ventricle has moderately decreased function. The left ventricle  demonstrates regional wall motion abnormalities (see scoring  diagram/findings for description). Left ventricular  diastolic parameters are consistent with Grade I diastolic dysfunction  (impaired relaxation). There is moderate hypokinesis of the left  ventricular, entire anteroseptal wall, inferoseptal wall and inferior  wall. Hypokinesis of the septal/inferior walls,  preserved at apex.  2. Right ventricular systolic function is normal. The right ventricular  size is normal. There is normal pulmonary artery systolic pressure.  3. Left atrial size was severely dilated.  4. The mitral valve is normal in structure. Mild mitral valve  regurgitation.  5. The aortic valve is tricuspid. There is mild calcification of the  aortic valve. There is mild thickening of the aortic valve. Aortic valve  regurgitation is moderate to severe. Mild to moderate aortic valve  sclerosis/calcification is present, without  any evidence of aortic stenosis.   Antimicrobials:    None   Subjective: Patient seen and examined at bedside, resting comfortably.  Lying in bed.  No complaints.  Daughter present at bedside and updated.  Awaiting insurance authorization for CIR.  cardiology increased Lasix to 40 mg p.o. daily today.  Denies headache, no dizziness, no chest pain, palpitations, no shortness of breath, no abdominal pain, no weakness, no fatigue, no paresthesias.  No acute events overnight per nursing staff.  Objective: Vitals:   05/14/20 1802 05/14/20 2010 05/15/20 0418 05/15/20 1004  BP: 134/69 (!) 121/56 104/89 (!) 138/56  Pulse:  75 73 72  Resp:  16 17 17   Temp: 99.2 F (37.3 C) 98.6 F (37 C) (!) 97.4 F (36.3 C) 98.7 F (37.1 C)  TempSrc:  Oral Oral Oral  SpO2:  95% 95% 98%  Weight:      Height:        Intake/Output Summary (Last 24 hours) at 05/15/2020 1342 Last data filed at 05/15/2020 0900 Gross per 24 hour  Intake 120 ml  Output --  Net 120 ml   Filed Weights   05/13/20 1728  Weight: 59 kg    Examination:  General exam: Appears calm and comfortable, elderly in appearance Respiratory system: Clear to auscultation. Respiratory effort normal.  Oxygenating well on room air Cardiovascular system: S1 & S2 heard, RRR. 3/6 diastolic murmur noted, no JVD, rubs, gallops or clicks. No pedal edema. Gastrointestinal system: Abdomen is nondistended, soft and nontender. No organomegaly or masses felt. Normal bowel sounds heard. Central nervous system: Alert and oriented. No focal neurological deficits. Extremities: Symmetric 5 x 5 power. Skin: Ecchymosis noted left shoulder/clavicle region, right knee, otherwise no rashes, lesions or ulcers Psychiatry: Judgement and insight appear normal. Mood & affect appropriate.     Data Reviewed: I have personally  reviewed following labs and imaging studies  CBC: Recent Labs  Lab 05/13/20 1854 05/14/20 0316  WBC 7.9 6.4  NEUTROABS 5.5  --   HGB 10.6* 9.9*  HCT 33.6* 30.1*  MCV 99.4 98.0  PLT  329 553   Basic Metabolic Panel: Recent Labs  Lab 05/13/20 1854 05/14/20 0316 05/15/20 0348  NA 142 141 141  K 3.5 3.1* 4.3  CL 105 104 105  CO2 26 30 26   GLUCOSE 100* 90 92  BUN 21 17 19   CREATININE 0.88 0.80 0.80  CALCIUM 9.0 8.8* 8.6*  MG  --   --  1.9   GFR: Estimated Creatinine Clearance: 43.6 mL/min (by C-G formula based on SCr of 0.8 mg/dL). Liver Function Tests: Recent Labs  Lab 05/13/20 1854  AST 24  ALT 13  ALKPHOS 62  BILITOT 0.8  PROT 6.4*  ALBUMIN 3.8   No results for input(s): LIPASE, AMYLASE in the last 168 hours. No results for input(s): AMMONIA in the last 168 hours. Coagulation Profile: No results for input(s): INR, PROTIME in the last 168 hours. Cardiac Enzymes: No results for input(s): CKTOTAL, CKMB, CKMBINDEX, TROPONINI in the last 168 hours. BNP (last 3 results) No results for input(s): PROBNP in the last 8760 hours. HbA1C: No results for input(s): HGBA1C in the last 72 hours. CBG: No results for input(s): GLUCAP in the last 168 hours. Lipid Profile: No results for input(s): CHOL, HDL, LDLCALC, TRIG, CHOLHDL, LDLDIRECT in the last 72 hours. Thyroid Function Tests: Recent Labs    05/13/20 2244  TSH 2.076   Anemia Panel: No results for input(s): VITAMINB12, FOLATE, FERRITIN, TIBC, IRON, RETICCTPCT in the last 72 hours. Sepsis Labs: No results for input(s): PROCALCITON, LATICACIDVEN in the last 168 hours.  Recent Results (from the past 240 hour(s))  Respiratory Panel by RT PCR (Flu A&B, Covid) - Nasopharyngeal Swab     Status: None   Collection Time: 05/13/20  9:38 PM   Specimen: Nasopharyngeal Swab  Result Value Ref Range Status   SARS Coronavirus 2 by RT PCR NEGATIVE NEGATIVE Final    Comment: (NOTE) SARS-CoV-2 target nucleic acids are NOT DETECTED.  The SARS-CoV-2 RNA is generally detectable in upper respiratoy specimens during the acute phase of infection. The lowest concentration of SARS-CoV-2 viral copies this assay can  detect is 131 copies/mL. A negative result does not preclude SARS-Cov-2 infection and should not be used as the sole basis for treatment or other patient management decisions. A negative result may occur with  improper specimen collection/handling, submission of specimen other than nasopharyngeal swab, presence of viral mutation(s) within the areas targeted by this assay, and inadequate number of viral copies (<131 copies/mL). A negative result must be combined with clinical observations, patient history, and epidemiological information. The expected result is Negative.  Fact Sheet for Patients:  PinkCheek.be  Fact Sheet for Healthcare Providers:  GravelBags.it  This test is no t yet approved or cleared by the [place] FDA and  has been authorized for detection and/or diagnosis of SARS-CoV-2 by FDA under an Emergency Use Authorization (EUA). This EUA will remain  in effect (meaning this test can be used) for the duration of the COVID-19 declaration under Section 564(b)(1) of the Act, 21 U.S.C. section 360bbb-3(b)(1), unless the authorization is terminated or revoked sooner.     Influenza A by PCR NEGATIVE NEGATIVE Final   Influenza B by PCR NEGATIVE NEGATIVE Final    Comment: (NOTE) The Xpert Xpress SARS-CoV-2/FLU/RSV assay is intended as an  aid in  the diagnosis of influenza from Nasopharyngeal swab specimens and  should not be used as a sole basis for treatment. Nasal washings and  aspirates are unacceptable for Xpert Xpress SARS-CoV-2/FLU/RSV  testing.  Fact Sheet for Patients: PinkCheek.be  Fact Sheet for Healthcare Providers: GravelBags.it  This test is not yet approved or cleared by the [place] FDA and  has been authorized for detection and/or diagnosis of SARS-CoV-2 by  FDA under an Emergency Use Authorization (EUA). This EUA will remain  in  effect (meaning this test can be used) for the duration of the  Covid-19 declaration under Section 564(b)(1) of the Act, 21  U.S.C. section 360bbb-3(b)(1), unless the authorization is  terminated or revoked. Performed at [facility], [facility] [address] [place], [place] [address]          Radiology Studies: DG Chest 2 View  Result Date: 05/13/2020 CLINICAL DATA:  79 year old female with left rib pain. EXAM: CHEST - 2 VIEW COMPARISON:  Chest radiograph dated 11/27/2019. FINDINGS: There is blunting of the left costophrenic angle with flattening of the left hemidiaphragm which may represent trace left pleural effusion and left lung base atelectasis. Pneumonia is not excluded clinical correlation is recommended. The right lung is clear. No pneumothorax. Mild cardiomegaly. There is widening of the upper mediastinum similar to prior radiograph. Atherosclerotic calcification of the aorta. No acute osseous pathology. IMPRESSION: Probable trace left pleural effusion and left lung base atelectasis. Pneumonia is not excluded. Electronically Signed   By: Anner Crete M.D.   On: 05/13/2020 18:51   DG Clavicle Left  Result Date: 05/13/2020 CLINICAL DATA:  Fall, clavicular pain EXAM: LEFT CLAVICLE - 2+ VIEWS COMPARISON:  Chest radiograph 05/13/2020 FINDINGS: Fracture of the distal third left clavicle with slight inferior displacement of 1/3 shaft width. Minimal foreshortening across the fracture line. No other acute osseous abnormality is seen. Overlying soft tissue swelling. Acromioclavicular and glenohumeral alignment appears maintained. Background of moderate degenerative change at the acromioclavicular and glenohumeral joints. Mineralization seen about the inferior glenohumeral recess may reflect small joint body. IMPRESSION: 1. Fracture of the distal third left clavicle with slight inferior displacement of 1/3 shaft width. Associated swelling. 2. Moderate degenerative change of the  acromioclavicular and glenohumeral joints. Electronically Signed   By: Lovena Le M.D.   On: 05/13/2020 19:36   CT Head Wo Contrast  Result Date: 05/13/2020 CLINICAL DATA:  79 year old female with head trauma. EXAM: CT HEAD WITHOUT CONTRAST CT CERVICAL SPINE WITHOUT CONTRAST TECHNIQUE: Multidetector CT imaging of the head and cervical spine was performed following the standard protocol without intravenous contrast. Multiplanar CT image reconstructions of the cervical spine were also generated. COMPARISON:  CT dated 04/30/2020. FINDINGS: CT HEAD FINDINGS Brain: Mild age-related atrophy and chronic microvascular ischemic changes. There is no acute intracranial hemorrhage. No mass effect midline shift. No extra-axial fluid collection. Vascular: No hyperdense vessel or unexpected calcification. Skull: Normal. Negative for fracture or focal lesion. Sinuses/Orbits: No acute finding. Other: None CT CERVICAL SPINE FINDINGS Alignment: No acute subluxation. Skull base and vertebrae: No acute cervical spine fracture. Osteopenia. Soft tissues and spinal canal: No prevertebral fluid or swelling. No visible canal hematoma. Disc levels: Multilevel degenerative changes with endplate irregularity and disc space narrowing. Multilevel facet arthropathy. Upper chest: Negative. Other: Partially visualized comminuted and displaced fracture of the left clavicle. IMPRESSION: 1. No acute intracranial pathology. Mild age-related atrophy and chronic microvascular ischemic changes. 2. No acute/traumatic cervical spine pathology. Multilevel degenerative changes. 3. Partially visualized  comminuted and displaced fracture of the left clavicle. Electronically Signed   By: Anner Crete M.D.   On: 05/13/2020 19:38   CT Cervical Spine Wo Contrast  Result Date: 05/13/2020 CLINICAL DATA:  79 year old female with head trauma. EXAM: CT HEAD WITHOUT CONTRAST CT CERVICAL SPINE WITHOUT CONTRAST TECHNIQUE: Multidetector CT imaging of the head  and cervical spine was performed following the standard protocol without intravenous contrast. Multiplanar CT image reconstructions of the cervical spine were also generated. COMPARISON:  CT dated 04/30/2020. FINDINGS: CT HEAD FINDINGS Brain: Mild age-related atrophy and chronic microvascular ischemic changes. There is no acute intracranial hemorrhage. No mass effect midline shift. No extra-axial fluid collection. Vascular: No hyperdense vessel or unexpected calcification. Skull: Normal. Negative for fracture or focal lesion. Sinuses/Orbits: No acute finding. Other: None CT CERVICAL SPINE FINDINGS Alignment: No acute subluxation. Skull base and vertebrae: No acute cervical spine fracture. Osteopenia. Soft tissues and spinal canal: No prevertebral fluid or swelling. No visible canal hematoma. Disc levels: Multilevel degenerative changes with endplate irregularity and disc space narrowing. Multilevel facet arthropathy. Upper chest: Negative. Other: Partially visualized comminuted and displaced fracture of the left clavicle. IMPRESSION: 1. No acute intracranial pathology. Mild age-related atrophy and chronic microvascular ischemic changes. 2. No acute/traumatic cervical spine pathology. Multilevel degenerative changes. 3. Partially visualized comminuted and displaced fracture of the left clavicle. Electronically Signed   By: Anner Crete M.D.   On: 05/13/2020 19:38   DG Knee Complete 4 Views Right  Result Date: 05/13/2020 CLINICAL DATA:  Fall right-sided knee pain with bruising EXAM: RIGHT KNEE - COMPLETE 4+ VIEW COMPARISON:  08/24/2019 FINDINGS: No acute displaced fracture or malalignment. Moderate tricompartment arthritis of the knee. No significant effusion IMPRESSION: No acute osseous abnormality. Electronically Signed   By: Donavan Foil M.D.   On: 05/13/2020 18:52   ECHOCARDIOGRAM COMPLETE  Result Date: 05/14/2020    ECHOCARDIOGRAM REPORT   Patient Name:   DIVINA NEALE Date of Exam:  05/14/2020 Medical Rec #:  [id number]            Height:       62.0 in Accession #:    [id number]           Weight:       130.0 lb Date of Birth:  05-Jan-1941            BSA:          1.592 m Patient Age:    57 years             BP:           133/62 mmHg Patient Gender: F                    HR:           73 bpm. Exam Location:  Inpatient Procedure: 2D Echo Indications:    syncope 780.2  History:        Patient has prior history of Echocardiogram examinations, most                 recent 08/11/2017. Abdominal aortic aneurysm; Risk                 Factors:Dyslipidemia.  Sonographer:    Johny Chess Referring Phys: [id number] AMY N COX IMPRESSIONS  1. Left ventricular ejection fraction, by estimation, is 30 to 35%. The left ventricle has moderately decreased function. The left ventricle demonstrates regional wall motion abnormalities (see scoring diagram/findings for description). Left ventricular  diastolic parameters are consistent with Grade I diastolic dysfunction (impaired relaxation). There is moderate hypokinesis of the left ventricular, entire anteroseptal wall, inferoseptal wall and inferior wall. Hypokinesis of the septal/inferior walls,  preserved at apex.  2. Right ventricular systolic function is normal. The right ventricular size is normal. There is normal pulmonary artery systolic pressure.  3. Left atrial size was severely dilated.  4. The mitral valve is normal in structure. Mild mitral valve regurgitation.  5. The aortic valve is tricuspid. There is mild calcification of the aortic valve. There is mild thickening of the aortic valve. Aortic valve regurgitation is moderate to severe. Mild to moderate aortic valve sclerosis/calcification is present, without any evidence of aortic stenosis. Comparison(s): Prior images reviewed side by side. Changes from prior study are noted. Conclusion(s)/Recommendation(s): I reviewed 2019 study side by side. On my read, there was mild-moderate LVEF reduction with mild  hypokinesis of the septal and inferior walls in 2019. These abnormalities are now more prominent/severe on current study. Aortic regurgitation moderate-severe. Findings communicated with attending Dr. [place] at 1:44 PM via secure chat. FINDINGS  Left Ventricle: Left ventricular ejection fraction, by estimation, is 30 to 35%. The left ventricle has moderately decreased function. The left ventricle demonstrates regional wall motion abnormalities. Moderate hypokinesis of the left ventricular, entire anteroseptal wall, inferoseptal wall and inferior wall. The left ventricular internal cavity size was normal in size. There is no left ventricular hypertrophy. Abnormal (paradoxical) septal motion, consistent with left bundle branch block. Left ventricular diastolic parameters are consistent with Grade I diastolic dysfunction (impaired relaxation).  LV Wall Scoring: Hypokinesis of the septal/inferior walls, preserved at apex. Right Ventricle: The right ventricular size is normal. Right vetricular wall thickness was not well visualized. Right ventricular systolic function is normal. There is normal pulmonary artery systolic pressure. The tricuspid regurgitant velocity is 2.79 m/s, and with an assumed right atrial pressure of 3 mmHg, the estimated right ventricular systolic pressure is 44.0 mmHg. Left Atrium: Left atrial size was severely dilated. Right Atrium: Right atrial size was normal in size. Pericardium: There is no evidence of pericardial effusion. Mitral Valve: The mitral valve is normal in structure. Mild mitral valve regurgitation. Tricuspid Valve: The tricuspid valve is normal in structure. Tricuspid valve regurgitation is mild . No evidence of tricuspid stenosis. Aortic Valve: The aortic valve is tricuspid. There is mild calcification of the aortic valve. There is mild thickening of the aortic valve. Aortic valve regurgitation is moderate to severe. Aortic regurgitation PHT measures 266 msec. Mild to moderate  aortic valve sclerosis/calcification is present, without any evidence of aortic stenosis. Pulmonic Valve: The pulmonic valve was not well visualized. Pulmonic valve regurgitation is trivial. No evidence of pulmonic stenosis. Aorta: The aortic root, ascending aorta, aortic arch and descending aorta are all structurally normal, with no evidence of dilitation or obstruction. IAS/Shunts: The atrial septum is grossly normal.  LEFT VENTRICLE PLAX 2D LVIDd:         4.90 cm      Diastology LVIDs:         3.50 cm      LV e' medial:    4.79 cm/s LV PW:         1.00 cm      LV E/e' medial:  15.1 LV IVS:        1.00 cm      LV e' lateral:   5.66 cm/s LVOT diam:     1.80 cm      LV E/e' lateral:  12.8 LV SV:         51 LV SV Index:   32 LVOT Area:     2.54 cm  LV Volumes (MOD) LV vol d, MOD A2C: 120.0 ml LV vol d, MOD A4C: 104.0 ml LV vol s, MOD A2C: 70.7 ml LV vol s, MOD A4C: 79.7 ml LV SV MOD A2C:     49.3 ml LV SV MOD A4C:     104.0 ml LV SV MOD BP:      38.3 ml RIGHT VENTRICLE             IVC RV S prime:     14.00 cm/s  IVC diam: 1.10 cm TAPSE (M-mode): 2.3 cm LEFT ATRIUM             Index       RIGHT ATRIUM           Index LA diam:        2.90 cm 1.82 cm/m  RA Area:     14.60 cm LA Vol (A2C):   80.8 ml 50.76 ml/m RA Volume:   34.20 ml  21.48 ml/m LA Vol (A4C):   84.0 ml 52.77 ml/m LA Biplane Vol: 86.3 ml 54.21 ml/m  AORTIC VALVE LVOT Vmax:   115.00 cm/s LVOT Vmean:  72.500 cm/s LVOT VTI:    0.199 m AI PHT:      266 msec  AORTA Ao Root diam: 2.90 cm MITRAL VALVE                TRICUSPID VALVE MV Area (PHT): 3.42 cm     TR Peak grad:   31.1 mmHg MV Decel Time: 222 msec     TR Vmax:        279.00 cm/s MV E velocity: 72.40 cm/s MV A velocity: 115.00 cm/s  SHUNTS MV E/A ratio:  0.63         Systemic VTI:  0.20 m                             Systemic Diam: 1.80 cm Buford Dresser MD Electronically signed by Buford Dresser MD Signature Date/Time: 05/14/2020/1:44:42 PM    Final    DG Hip Unilat W or Wo Pelvis 1  View Right  Result Date: 05/13/2020 CLINICAL DATA:  79 year old female with right hip pain. Fall. EXAM: DG HIP (WITH OR WITHOUT PELVIS) 1V RIGHT COMPARISON:  Right hip radiograph dated 04/30/2020. FINDINGS: There is no acute fracture or dislocation. The bones are osteopenic. Moderate bilateral hip osteoarthritic changes. There is degenerative changes of the lower lumbar spine. The soft tissues are unremarkable. IMPRESSION: Negative. Electronically Signed   By: Anner Crete M.D.   On: 05/13/2020 18:59        Scheduled Meds: . bisoprolol  2.5 mg Oral Daily  . [START ON 05/16/2020] Bromfenac Sodium  1 drop Right Eye Daily  . Carbidopa-Levodopa ER  2 tablet Oral TID  . [START ON 05/16/2020] furosemide  40 mg Oral Daily  . heparin injection (subcutaneous)  5,000 Units Subcutaneous Q8H  . lisinopril  2.5 mg Oral Daily  . moxifloxacin  1 drop Right Eye TID  . pravastatin  40 mg Oral q1800  . prednisoLONE acetate  1 drop Right Eye TID   Followed by  . [START ON 05/22/2020] prednisoLONE acetate  1 drop Right Eye BID   Followed by  . [START ON 05/29/2020] prednisoLONE acetate  1 drop Right  Eye Daily  . sertraline  25 mg Oral Daily   Continuous Infusions:   LOS: 1 day    Time spent: 36 minutes spent on chart review, discussion with nursing staff, consultants, updating family and interview/physical exam; more than 50% of that time was spent in counseling and/or coordination of care.    Cienna Dumais J [place] DO [facility] Available via Epic secure chat 7am-7pm After these hours, please refer to coverage provider listed on [email or web address] 05/15/2020, 1:42 PM

## 2020-05-15 NOTE — Progress Notes (Signed)
Pt family member placed pt eye drops in pt eyes, MD aware as well as pharmacy.

## 2020-05-15 NOTE — Progress Notes (Signed)
Inpatient Rehabilitation Admissions Coordinator  I met with patient and her daughter at bedside with PT. We discused goals and expectations of a possible [facility] admit. They prefer Cir before returning to [facility] with hired assistance added. I will begin insurance authorization with [employer] for a possible Cir admit . I will follow up tomorrow.   , RN, MSN Rehab Admissions Coordinator [phone] 05/15/2020 12:33 PM  

## 2020-05-15 NOTE — Progress Notes (Signed)
Patient seen and examined, also discussed with daughter, left clavicle fracture, right knee contusion, okay to use left upper extremity for support during ambulation with a walker, but I would not work aggressively on active range of motion or passive range of motion of the shoulder.  Okay to continue with the sling.  Instructed patient on how to use the thumb loop inside the sling.  I am available if any additional questions arise, plan for follow-up with me in 1 to 2 weeks after discharge.  Marchia Bond, MD.

## 2020-05-16 DIAGNOSIS — S42032A Displaced fracture of lateral end of left clavicle, initial encounter for closed fracture: Secondary | ICD-10-CM | POA: Diagnosis not present

## 2020-05-16 DIAGNOSIS — I428 Other cardiomyopathies: Secondary | ICD-10-CM | POA: Diagnosis not present

## 2020-05-16 DIAGNOSIS — I5023 Acute on chronic systolic (congestive) heart failure: Secondary | ICD-10-CM

## 2020-05-16 DIAGNOSIS — I5043 Acute on chronic combined systolic (congestive) and diastolic (congestive) heart failure: Secondary | ICD-10-CM | POA: Diagnosis not present

## 2020-05-16 DIAGNOSIS — I351 Nonrheumatic aortic (valve) insufficiency: Secondary | ICD-10-CM | POA: Diagnosis not present

## 2020-05-16 LAB — BASIC METABOLIC PANEL
Anion gap: 9 (ref 5–15)
BUN: 16 mg/dL (ref 8–23)
CO2: 27 mmol/L (ref 22–32)
Calcium: 9 mg/dL (ref 8.9–10.3)
Chloride: 104 mmol/L (ref 98–111)
Creatinine, Ser: 0.82 mg/dL (ref 0.44–1.00)
GFR, Estimated: >60 mL/min (ref >60)
Glucose, Bld: 101 mg/dL — ABNORMAL HIGH (ref 70–99)
Potassium: 3.9 mmol/L (ref 3.5–5.1)
Sodium: 140 mmol/L (ref 135–145)

## 2020-05-16 LAB — MAGNESIUM: Magnesium: 1.9 mg/dL (ref 1.7–2.4)

## 2020-05-16 MED ORDER — ACETAMINOPHEN 325 MG PO TABS
650.0000 mg | ORAL_TABLET | Freq: Four times a day (QID) | ORAL | Status: DC | PRN
  Administered 2020-05-16: 650 mg via ORAL
  Filled 2020-05-16: qty 2

## 2020-05-16 MED ORDER — LISINOPRIL 10 MG PO TABS
10.0000 mg | ORAL_TABLET | Freq: Every day | ORAL | Status: DC
  Administered 2020-05-17: 10 mg via ORAL
  Filled 2020-05-16: qty 1

## 2020-05-16 MED ORDER — LISINOPRIL 5 MG PO TABS
5.0000 mg | ORAL_TABLET | Freq: Once | ORAL | Status: AC
  Administered 2020-05-16: 5 mg via ORAL
  Filled 2020-05-16: qty 1

## 2020-05-16 NOTE — Plan of Care (Signed)
  Problem: Education: Goal: Knowledge of General Education information will improve Description Including pain rating scale, medication(s)/side effects and non-pharmacologic comfort measures Outcome: Progressing   Problem: Health Behavior/Discharge Planning: Goal: Ability to manage health-related needs will improve Outcome: Progressing   

## 2020-05-16 NOTE — Progress Notes (Signed)
Progress Note  Patient Name: Elizabeth Mccullough Date of Encounter: 05/16/2020  [facility] Cardiologist: Mertie Moores, MD   Subjective   No issues overnight- working toward CIR. BP elevated now - can increase lisinopril. She is net negative 880 cc. Creatinine is stable.  Inpatient Medications    Scheduled Meds: . bisoprolol  2.5 mg Oral Daily  . Bromfenac Sodium  1 drop Right Eye Daily  . Carbidopa-Levodopa ER  2 tablet Oral TID  . furosemide  40 mg Oral Daily  . heparin injection (subcutaneous)  5,000 Units Subcutaneous Q8H  . lisinopril  2.5 mg Oral Daily  . moxifloxacin  1 drop Right Eye TID  . pravastatin  40 mg Oral q1800  . prednisoLONE acetate  1 drop Right Eye TID   Followed by  . [START ON 05/22/2020] prednisoLONE acetate  1 drop Right Eye BID   Followed by  . [START ON 05/29/2020] prednisoLONE acetate  1 drop Right Eye Daily  . sertraline  25 mg Oral Daily   Continuous Infusions:  PRN Meds: acetaminophen, HYDROcodone-acetaminophen, LORazepam   Vital Signs    Vitals:   05/15/20 2004 05/16/20 0444 05/16/20 0931 05/16/20 0934  BP: (!) 150/74 (!) 161/70 (!) 173/73 (!) 173/73  Pulse: 75 81  74  Resp: 16 15  18   Temp: 97.8 F (36.6 C) 98.4 F (36.9 C)  98.8 F (37.1 C)  TempSrc: Oral Oral  Oral  SpO2: 97% 94%  98%  Weight:      Height:        Intake/Output Summary (Last 24 hours) at 05/16/2020 1012 Last data filed at 05/16/2020 0500 Gross per 24 hour  Intake --  Output 1000 ml  Net -1000 ml   Last 3 Weights 05/13/2020 02/05/2020 12/18/2019  Weight (lbs) 130 lb 131 lb 9.6 oz 128 lb 4.9 oz  Weight (kg) 58.968 kg 59.693 kg 58.2 kg      Telemetry    N/A  ECG    No new tracings - Personally Reviewed  Physical Exam   GEN: elderly female in no acute distress.   Neck: mild JVD Cardiac: RRR, + 2/6 murmur Respiratory: Clear to auscultation bilaterally. GI: Soft, nontender, non-distended  MS: No edema; No deformity. Neuro:  Nonfocal   Psych: Normal affect   Labs    High Sensitivity Troponin:   Recent Labs  Lab 05/13/20 1854 05/13/20 2102  TROPONINIHS 26* 29*      Chemistry Recent Labs  Lab 05/13/20 1854 05/13/20 1854 05/14/20 0316 05/15/20 0348 05/16/20 0457  NA 142   < > 141 141 140  K 3.5   < > 3.1* 4.3 3.9  CL 105   < > 104 105 104  CO2 26   < > 30 26 27   GLUCOSE 100*   < > 90 92 101*  BUN 21   < > 17 19 16   CREATININE 0.88   < > 0.80 0.80 0.82  CALCIUM 9.0   < > 8.8* 8.6* 9.0  PROT 6.4*  --   --   --   --   ALBUMIN 3.8  --   --   --   --   AST 24  --   --   --   --   ALT 13  --   --   --   --   ALKPHOS 62  --   --   --   --   BILITOT 0.8  --   --   --   --  GFRNONAA >60   < > >60 >60 >60  ANIONGAP 11   < > 7 10 9    < > = values in this interval not displayed.     Hematology Recent Labs  Lab 05/13/20 1854 05/14/20 0316  WBC 7.9 6.4  RBC 3.38* 3.07*  HGB 10.6* 9.9*  HCT 33.6* 30.1*  MCV 99.4 98.0  MCH 31.4 32.2  MCHC 31.5 32.9  RDW 12.9 12.9  PLT 329 294    BNPNo results for input(s): BNP, PROBNP in the last 168 hours.   DDimer No results for input(s): DDIMER in the last 168 hours.   Radiology    No results found.  Cardiac Studies   Echo 05/14/20: 1. Left ventricular ejection fraction, by estimation, is 30 to 35%. The  left ventricle has moderately decreased function. The left ventricle  demonstrates regional wall motion abnormalities (see scoring  diagram/findings for description). Left ventricular  diastolic parameters are consistent with Grade I diastolic dysfunction  (impaired relaxation). There is moderate hypokinesis of the left  ventricular, entire anteroseptal wall, inferoseptal wall and inferior  wall. Hypokinesis of the septal/inferior walls,  preserved at apex.  2. Right ventricular systolic function is normal. The right ventricular  size is normal. There is normal pulmonary artery systolic pressure.  3. Left atrial size was severely dilated.  4.  The mitral valve is normal in structure. Mild mitral valve  regurgitation.  5. The aortic valve is tricuspid. There is mild calcification of the  aortic valve. There is mild thickening of the aortic valve. Aortic valve  regurgitation is moderate to severe. Mild to moderate aortic valve  sclerosis/calcification is present, without  any evidence of aortic stenosis.   Patient Profile     79 y.o. female with a hx of NICM in 2018 with EF 20-25%, moderate MR and mod AR, EF improved with medication and 07/2017 EF 55-60%, Parkinson's disease, fusiform ascending aneurysm and mild carotid disease who is being seen for the evaluation of CHF and abnormal Echo.   Assessment & Plan    NICM Acute on chronic systolic and diastolic heart failure - losartan stopped in July 2021 due to marginal pressure - echo this admission with EF 30-35% with regional WMA - normal coronaries by heart cath in 2018 - diuresing on lasix 40 mg po daily - continue bisoprolol - can increase lisinopril to 10 mg daily tomorrow (Hypertensive today) - give additional 5 mg lisinopril tonight - BP in the 120-130s yesterday, now 160's - weight not recorded  Hyperlipidemia - 09/17/2019: Cholesterol 166; HDL 57.60; LDL Cholesterol 89; Triglycerides 95.0; VLDL 19.0 - continue pravastatin 40 mg daily  Fusiform ascending aortic aneurysm - not appreciated on echo this admission  Anemia - Hb 9.9 (10.6) , per primary - could be contributing to her dizziness  Moderate to severe aortic insufficiency - no AS - murmur on exam - could be contributing to her dyspnea one exertion - likely a poor surgical candidate  Parkinson's - contributing to autonomic dysfunction --> orthostatic hypotension and falls    For questions or updates, please contact [facility] Please consult [email or web address] for contact info under   Pixie Casino, MD, FACC, [place] Director of the [facility] Lipid [facility]   Cardi[facility] Diplomate of the American Board of Clinical Lipidology Attending Cardiologist  Direct Dial: [phone]  Fax: [phone]  Website:  [email or web address]  Pixie Casino, MD  05/16/2020, 10:12 AM

## 2020-05-16 NOTE — Care Management Important Message (Signed)
Important Message  Patient Details  Name: SHAWNY BORKOWSKI MRN: [id number] Date of Birth: 06-03-1941   Medicare Important Message Given:  Yes - Important Message mailed due to current National Emergency  Verbal consent obtained due to current National Emergency  Relationship to patient: Self Contact Name: Sharol Croghan Call Date: 05/16/20  Time: [place] Phone: [phone] Outcome: Spoke with contact Important Message mailed to: Patient address on file    Delorse Lek 05/16/2020, 12:54 PM

## 2020-05-16 NOTE — Progress Notes (Signed)
Inpatient Rehabilitation Admissions Coordinator  I have received approval for Cir admit. I spoke with both daughters by phone and they are aware. I contacted Dr. [place] and will plan admit to Cir Saturday. Dr. Naaman Plummer will round in the morning for final approval and the 5 N RN can call rehab charge nurse at 12 noon [phone] to give report. I will make further arrangements to admit Saturday.  Danne Baxter, RN, MSN Rehab Admissions Coordinator [phone] 05/16/2020 4:23 PM

## 2020-05-16 NOTE — H&P (Signed)
Physical Medicine and Rehabilitation Admission H&P    Chief Complaint  Patient presents with  . 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  Per chart review patient resides independent living facility [facility] since March 2021.  Ambulates with a rolling walker but does endorse frequent falls.  She was able to bathe dress feet and told herself and goes to the dining hall for meals..  Daughter and family plan to hire assistance as needed.  Presented 05/13/2020 after falling 3 times on day of presentation.  Denied loss of consciousness.  Admission chemistries glucose 100, troponin 26>29, hemoglobin 10.6, urinalysis negative nitrite.  Cranial CT scan as well as CT cervical spine showed no acute intracranial pathology and no acute traumatic cervical spine pathology.  X-rays right knee no acute osseous abnormality.  Findings of comminuted displaced left clavicle fracture.  Follow-up orthopedic services Dr. Mardelle Matte in regards to left clavicle fracture also right knee contusion with conservative care and no surgical intervention.  Nonweightbearing left upper extremity with shoulder sling.  Cardiology service follow-up for history of CHF with latest chest x-ray showing probable left pleural effusion and left lung base atelectasis and echocardiogram completed showing ejection fraction 30 to 60% grade 1 diastolic dysfunction, moderate hypokinesis LV...  Patient currently remains on Lasix 40 mg daily as well as lisinopril with Zebeta 2.5 mg daily.  Subcutaneous heparin added for DVT prophylaxis.  Therapy evaluations completed and patient was admitted for a comprehensive rehab  program.  Review of Systems  Constitutional: Negative for chills and fever.  HENT: Negative for hearing loss.   Eyes: Negative for blurred vision and double vision.  Respiratory: Positive for shortness of breath. Negative for cough.   Cardiovascular: Positive for palpitations and leg swelling. Negative for chest pain.  Gastrointestinal: Positive for constipation. Negative for heartburn and nausea.  Genitourinary: Negative for dysuria, flank pain and hematuria.  Musculoskeletal: Positive for falls and myalgias.  Skin: Negative for rash.  Neurological: Positive for dizziness and tremors.  Psychiatric/Behavioral: The patient has insomnia.        Anxiety  All other systems reviewed and are negative.  Past Medical History:  Diagnosis Date  . AAA (abdominal aortic aneurysm) ([place])   . Carotid artery disease ([place]) 01/12/2017   Carotid US 6/18: Bilat < 50%; repeat in 12/2000  . Chronic combined systolic and diastolic CHF (congestive heart failure) ([place]) 03/01/2017   NICM // [facility] 8/18: normal coronary arteries /  Echo 8/18: EF 20-25, mod AI, mod MR, PASP 32 // Echo 1/19: EF 55-60, no RWMA, Gr 1 DD, mod AI, mild MR, PASP 32  . Hyperlipidemia   . Parkinson's disease [facility])    Past Surgical History:  Procedure Laterality Date  . BREAST CYST ASPIRATION  1965   Right Breast  . HAMMER TOE SURGERY  04/10/02   Left Toe  . KNEE SURGERY Right 10/17/15   meniscus repair  . NASAL SEPTUM SURGERY  1980  . RIGHT/LEFT HEART CATH AND CORONARY ANGIOGRAPHY N/A 03/03/2017   Procedure: RIGHT/LEFT HEART CATH AND CORONARY ANGIOGRAPHY;  Surgeon: Nelva Bush, MD;  Location: [facility];  Service: Cardiovascular;  Laterality: N/A;  . THORACIC AORTOGRAM N/A 03/03/2017   Procedure: Thoracic Aortogram;  Surgeon: Nelva Bush, MD;  Location: [facility];  Service: Cardiovascular;  Laterality: N/A;   Family History  Problem Relation Age of Onset  . Heart disease Mother   . Hyperlipidemia Sister   .  Hyperlipidemia Brother   . Diabetes Sister   . Hyperlipidemia Sister   . Cancer Sister 1       colon  . Cancer Sister        breast  . Stroke Brother   . Hypertension Brother   . Breast cancer Other   . Colon cancer Other   . Prostate cancer Son   . Diabetes Daughter   . Stomach cancer Daughter   . Healthy Daughter    Social History:  reports that she has never smoked. She has never used smokeless tobacco. She reports current alcohol use. She reports that she does not use drugs. Allergies:  Allergies  Allergen Reactions  . Iohexol Rash     Code: RASH, Desc: PATIENT STATES SHE IS ALLERGIC TO IV DYE. 20 YRS AGO SHE HAD A REACTION AT [facility], PT WAS GIVEN BENADRYL. 07/13/06/RM, Onset Date: [id number]   . Penicillins Hives   Medications Prior to Admission  Medication Sig Dispense Refill  . Acetaminophen 325 MG CAPS Take 1-2 capsules by mouth every 8 (eight) hours as needed (moderate to severe pain). 30 capsule 0  . aspirin EC 81 MG tablet Take 1 tablet (81 mg total) by mouth daily.    . bisoprolol (ZEBETA) 5 MG tablet Take 0.5 tablets (2.5 mg total) by mouth daily. 45 tablet 3  . Bromfenac Sodium (PROLENSA) 0.07 % SOLN Apply 1 drop to eye daily. Right eye    . Carbidopa-Levodopa ER (SINEMET CR) 25-100 MG tablet controlled release TAKE 2 TABLETS BY MOUTH 3 TIMES A DAY (Patient taking differently: Take 2 tablets by mouth in the morning, at noon, and at bedtime. TAKE 2 TABLETS BY MOUTH 3 TIMES A DAY) 540 tablet 0  . furosemide (LASIX) 20 MG tablet Take 1 tablet (20 mg total) by mouth daily. 90 tablet 3  . ibuprofen (ADVIL) 200 MG tablet Take 400 mg by mouth every 6 (six) hours as needed for moderate pain.    Marland Kitchen lovastatin (MEVACOR) 20 MG tablet TAKE 1 TABLET BY MOUTH AT BEDTIME. (Patient taking differently: Take 20 mg by mouth at bedtime. ) 90 tablet 0  . moxifloxacin (VIGAMOX) 0.5 % ophthalmic solution Place 1 drop into the right eye in the morning, at noon, in the evening, and at  bedtime.    . potassium chloride (KLOR-CON) 10 MEQ tablet Take 1 tablet (10 mEq total) by mouth daily. 90 tablet 3  . prednisoLONE acetate (PRED FORTE) 1 % ophthalmic suspension Place 1 drop into the right eye 4 (four) times daily.    . sertraline (ZOLOFT) 50 MG tablet TAKE 1/2 TABLET BY MOUTH DAILY FOR 8 DAYS THEN INCREASE TO 1 TABLET A DAY (Patient taking differently: Take 50 mg by mouth daily. ) 30 tablet 2    Drug Regimen Review Drug regimen was reviewed and remains appropriate with no significant issues identified.  Home: Home Living Family/patient expects to be discharged to:: Assisted living (Independent living) Living Arrangements: Alone, Other (Comment) (ILF at [facility] since March 2021. Has onsite emergen) Available Help at Discharge: Other (Comment) (daughter states family will hire assistance to be provided a) Type of Home: Independent living facility Home Access: Level entry Home Layout: One level Bathroom Shower/Tub: Multimedia programmer: Handicapped height Bathroom Accessibility: Yes Home Equipment:  Walker - 4 wheels, Grab bars - toilet, Grab bars - tub/shower, Shower seat, Bedside commode Additional Comments: pt has staff from rehab observing her self care to give her feedback  Lives With: Alone, Other (Comment) ([place])   Functional History: Prior Function Level of Independence: Needs assistance Gait / Transfers Assistance Needed: Able to ambulate with Rollator ADL's / Homemaking Assistance Needed: Pt able to bathe, dress, feed and toilet self. Assistance with laundry, goes to dining hall for meals Comments: Endorses frequent falls  Functional Status:  Mobility: Bed Mobility Overal bed mobility: Needs Assistance Bed Mobility: Supine to Sit, Sit to Supine Supine to sit: Mod assist, HOB elevated Sit to supine: Max assist General bed mobility comments: Mod A to scoot hips forward, improved sequencing in reaching for bedrail with  unaffected UE. Max A to return to bed, difficulty initiating and required assist for B LE Transfers Overall transfer level: Needs assistance Equipment used: Rolling walker (2 wheeled) Transfers: Sit to/from Stand Sit to Stand: Min assist Stand pivot transfers: Min assist General transfer comment: Min A for sit to stand in power up, noted not much weight put through L UE on walker. Min A for manuvering DME during side steps at bedside Ambulation/Gait Ambulation/Gait assistance: Min assist Gait Distance (Feet): 30 Feet (15 x 2) Assistive device: IV Pole, 1 person hand held assist Gait Pattern/deviations: Decreased stride length, Step-to pattern, Step-through pattern, Wide base of support, Staggering left, Staggering right General Gait Details: pt is listing in mult directions, esp not safe to turn corners and steady to pivot and sit Gait velocity: reduced Gait velocity interpretation: <1.8 ft/sec, indicate of risk for recurrent falls    ADL: ADL Overall ADL's : Needs assistance/impaired Eating/Feeding: Set up, Sitting Grooming: Moderate assistance, Sitting, Oral care Grooming Details (indicate cue type and reason): Mod A overall for brushing teeth sitting EOB. Great difficulty problem solving, required max cues for sequencing. Use of sling helped deter pt from using L UE Upper Body Bathing: Moderate assistance, Sitting, Cueing for UE precautions Lower Body Bathing: Maximal assistance, Sit to/from stand Upper Body Dressing : Moderate assistance, Cueing for UE precautions, Sitting Upper Body Dressing Details (indicate cue type and reason): Mod A for sling mgmt  Lower Body Dressing: Maximal assistance, Sit to/from stand, Sitting/lateral leans Toilet Transfer: Minimal assistance, +2 for safety/equipment, +2 for physical assistance, Ambulation, BSC, Regular Toilet Toilet Transfer Details (indicate cue type and reason): Min A x 2 to maintain balance, shuffling gait noted. Attempted use of IV  pole, 1 and 2 person handheld assist, cueing to avoid L UE use Toileting- Clothing Manipulation and Hygiene: Moderate assistance, Sitting/lateral lean, Sit to/from stand Toileting - Clothing Manipulation Details (indicate cue type and reason): Mod A overall. pt able to get toilet paper, assist with clothing mgmt, but difficulty attending to L UE precaution Functional mobility during ADLs: Minimal assistance, +2 for physical assistance, +2 for safety/equipment, Rolling walker, Cueing for sequencing, Cueing for safety General ADL Comments: Pt with improved balance with RW (cleared by MD), noted with deficits in problem solving ADLs today  Cognition: Cognition Overall Cognitive Status: History of cognitive impairments - at baseline Orientation Level: Oriented to person, Oriented to place, Disoriented to time Cognition Arousal/Alertness: Awake/alert Behavior During Therapy: Flat affect Overall Cognitive Status: History of cognitive impairments - at baseline Area of Impairment: Orientation, Attention, Memory, Following commands, Safety/judgement, Awareness, Problem solving Orientation Level: Place Current Attention Level: Sustained Memory: Decreased recall of precautions, Decreased short-term memory Following Commands: Follows  one step commands with increased time Safety/Judgement: Decreased awareness of safety, Decreased awareness of deficits Awareness: Intellectual Problem Solving: Slow processing, Decreased initiation, Difficulty sequencing, Requires verbal cues, Requires tactile cues General Comments: Pt with difficulty problem solving multi step ADL tasks today,  Difficult to assess due to: Level of arousal  Physical Exam: Blood pressure (!) 161/70, pulse 81, temperature 98.4 F (36.9 C), temperature source Oral, resp. rate 15, height 5\' 2"  (1.575 m), weight 59 kg, last menstrual period 07/19/1988, SpO2 94 %. Physical Exam Constitutional:      Appearance: She is ill-appearing.  HENT:      Head: Normocephalic and atraumatic.     Right Ear: External ear normal.     Left Ear: External ear normal.     Nose: Nose normal.     Mouth/Throat:     Mouth: Mucous membranes are moist.     Pharynx: Oropharynx is clear.  Eyes:     Conjunctiva/sclera: Conjunctivae normal.     Pupils: Pupils are equal, round, and reactive to light.  Cardiovascular:     Rate and Rhythm: Normal rate and regular rhythm.     Heart sounds: Murmur heard.   Pulmonary:     Effort: Pulmonary effort is normal. No respiratory distress.     Breath sounds: Normal breath sounds. No wheezing.  Abdominal:     General: Abdomen is flat. There is no distension.     Tenderness: There is no abdominal tenderness.  Musculoskeletal:        General: Tenderness present.     Cervical back: Normal range of motion.     Comments: Left shoulder painful with minimal movement. Large amount of bruising behind left scapula  Skin:    Comments: Scattered areas of bruising, large bruise behind left shoulder  Neurological:     Comments: Patient is alert in no acute distress.  Makes eye contact with examiner and follows commands. Could provide me biographical information with extra time. Overall with processing delays. Speech very dysphonic/hypophonic but does fluctuate. Pill rolling tremor, masked facies, head forward. No frank rigidity but bradykinesias. Motor 4/5 RUE and 3-4/5 LUE limited by clavicle pain. LE's bilaterally 3-4/5. No focal sensory findings. DTR's trace to absent  Psychiatric:     Comments: Flat but overall pleasant and cooperative     Results for orders placed or performed during the hospital encounter of 05/13/20 (from the past 48 hour(s))  Basic metabolic panel     Status: Abnormal   Collection Time: 05/15/20  3:48 AM  Result Value Ref Range   Sodium 141 135 - 145 mmol/L   Potassium 4.3 3.5 - 5.1 mmol/L    Comment: NO VISIBLE HEMOLYSIS   Chloride 105 98 - 111 mmol/L   CO2 26 22 - 32 mmol/L   Glucose, Bld  92 70 - 99 mg/dL    Comment: Glucose reference range applies only to samples taken after fasting for at least 8 hours.   BUN 19 8 - 23 mg/dL   Creatinine, Ser 0.80 0.44 - 1.00 mg/dL   Calcium 8.6 (L) 8.9 - 10.3 mg/dL   GFR, Estimated >60 >60 mL/min    Comment: (NOTE) Calculated using the CKD-EPI Creatinine Equation (2021)    Anion gap 10 5 - 15    Comment: Performed at [facility] [address] [place], [place] [address]  Magnesium     Status: None   Collection Time: 05/15/20  3:48 AM  Result Value Ref Range   Magnesium  1.9 1.7 - 2.4 mg/dL    Comment: Performed at [facility], [place] [address], [place], [place] [address]  Basic metabolic panel     Status: Abnormal   Collection Time: 05/16/20  4:57 AM  Result Value Ref Range   Sodium 140 135 - 145 mmol/L   Potassium 3.9 3.5 - 5.1 mmol/L   Chloride 104 98 - 111 mmol/L   CO2 27 22 - 32 mmol/L   Glucose, Bld 101 (H) 70 - 99 mg/dL    Comment: Glucose reference range applies only to samples taken after fasting for at least 8 hours.   BUN 16 8 - 23 mg/dL   Creatinine, Ser 0.82 0.44 - 1.00 mg/dL   Calcium 9.0 8.9 - 10.3 mg/dL   GFR, Estimated >60 >60 mL/min    Comment: (NOTE) Calculated using the CKD-EPI Creatinine Equation (2021)    Anion gap 9 5 - 15    Comment: Performed at [facility] [address] [place], [place] [address]  Magnesium     Status: None   Collection Time: 05/16/20  4:57 AM  Result Value Ref Range   Magnesium 1.9 1.7 - 2.4 mg/dL    Comment: Performed at [facility] [address] [place], [place] [address]   ECHOCARDIOGRAM COMPLETE  Result Date: 05/14/2020    ECHOCARDIOGRAM REPORT   Patient Name:   Elizabeth Mccullough Date of Exam: 05/14/2020 Medical Rec #:  [id number]            Height:       62.0 in Accession #:    [id number]           Weight:       130.0 lb Date of Birth:  1941-01-24            BSA:          1.592 m Patient Age:    65 years             BP:           133/62  mmHg Patient Gender: F                    HR:           73 bpm. Exam Location:  Inpatient Procedure: 2D Echo Indications:    syncope 780.2  History:        Patient has prior history of Echocardiogram examinations, most                 recent 08/11/2017. Abdominal aortic aneurysm; Risk                 Factors:Dyslipidemia.  Sonographer:    Johny Chess Referring Phys: [id number] AMY N [facility] IMPRESSIONS  1. Left ventricular ejection fraction, by estimation, is 30 to 35%. The left ventricle has moderately decreased function. The left ventricle demonstrates regional wall motion abnormalities (see scoring diagram/findings for description). Left ventricular  diastolic parameters are consistent with Grade I diastolic dysfunction (impaired relaxation). There is moderate hypokinesis of the left ventricular, entire anteroseptal wall, inferoseptal wall and inferior wall. Hypokinesis of the septal/inferior walls,  preserved at apex.  2. Right ventricular systolic function is normal. The right ventricular size is normal. There is normal pulmonary artery systolic pressure.  3. Left atrial size was severely dilated.  4. The mitral valve is normal in structure. Mild mitral valve regurgitation.  5. The aortic valve is tricuspid. There is mild  calcification of the aortic valve. There is mild thickening of the aortic valve. Aortic valve regurgitation is moderate to severe. Mild to moderate aortic valve sclerosis/calcification is present, without any evidence of aortic stenosis. Comparison(s): Prior images reviewed side by side. Changes from prior study are noted. Conclusion(s)/Recommendation(s): I reviewed 2019 study side by side. On my read, there was mild-moderate LVEF reduction with mild hypokinesis of the septal and inferior walls in 2019. These abnormalities are now more prominent/severe on current study. Aortic regurgitation moderate-severe. Findings communicated with attending Dr. [place] at 1:44 PM via secure chat. FINDINGS   Left Ventricle: Left ventricular ejection fraction, by estimation, is 30 to 35%. The left ventricle has moderately decreased function. The left ventricle demonstrates regional wall motion abnormalities. Moderate hypokinesis of the left ventricular, entire anteroseptal wall, inferoseptal wall and inferior wall. The left ventricular internal cavity size was normal in size. There is no left ventricular hypertrophy. Abnormal (paradoxical) septal motion, consistent with left bundle branch block. Left ventricular diastolic parameters are consistent with Grade I diastolic dysfunction (impaired relaxation).  LV Wall Scoring: Hypokinesis of the septal/inferior walls, preserved at apex. Right Ventricle: The right ventricular size is normal. Right vetricular wall thickness was not well visualized. Right ventricular systolic function is normal. There is normal pulmonary artery systolic pressure. The tricuspid regurgitant velocity is 2.79 m/s, and with an assumed right atrial pressure of 3 mmHg, the estimated right ventricular systolic pressure is 01.7 mmHg. Left Atrium: Left atrial size was severely dilated. Right Atrium: Right atrial size was normal in size. Pericardium: There is no evidence of pericardial effusion. Mitral Valve: The mitral valve is normal in structure. Mild mitral valve regurgitation. Tricuspid Valve: The tricuspid valve is normal in structure. Tricuspid valve regurgitation is mild . No evidence of tricuspid stenosis. Aortic Valve: The aortic valve is tricuspid. There is mild calcification of the aortic valve. There is mild thickening of the aortic valve. Aortic valve regurgitation is moderate to severe. Aortic regurgitation PHT measures 266 msec. Mild to moderate aortic valve sclerosis/calcification is present, without any evidence of aortic stenosis. Pulmonic Valve: The pulmonic valve was not well visualized. Pulmonic valve regurgitation is trivial. No evidence of pulmonic stenosis. Aorta: The aortic root,  ascending aorta, aortic arch and descending aorta are all structurally normal, with no evidence of dilitation or obstruction. IAS/Shunts: The atrial septum is grossly normal.  LEFT VENTRICLE PLAX 2D LVIDd:         4.90 cm      Diastology LVIDs:         3.50 cm      LV e' medial:    4.79 cm/s LV PW:         1.00 cm      LV E/e' medial:  15.1 LV IVS:        1.00 cm      LV e' lateral:   5.66 cm/s LVOT diam:     1.80 cm      LV E/e' lateral: 12.8 LV SV:         51 LV SV Index:   32 LVOT Area:     2.54 cm  LV Volumes (MOD) LV vol d, MOD A2C: 120.0 ml LV vol d, MOD A4C: 104.0 ml LV vol s, MOD A2C: 70.7 ml LV vol s, MOD A4C: 79.7 ml LV SV MOD A2C:     49.3 ml LV SV MOD A4C:     104.0 ml LV SV MOD BP:      38.3  ml RIGHT VENTRICLE             IVC RV S prime:     14.00 cm/s  IVC diam: 1.10 cm TAPSE (M-mode): 2.3 cm LEFT ATRIUM             Index       RIGHT ATRIUM           Index LA diam:        2.90 cm 1.82 cm/m  RA Area:     14.60 cm LA Vol (A2C):   80.8 ml 50.76 ml/m RA Volume:   34.20 ml  21.48 ml/m LA Vol (A4C):   84.0 ml 52.77 ml/m LA Biplane Vol: 86.3 ml 54.21 ml/m  AORTIC VALVE LVOT Vmax:   115.00 cm/s LVOT Vmean:  72.500 cm/s LVOT VTI:    0.199 m AI PHT:      266 msec  AORTA Ao Root diam: 2.90 cm MITRAL VALVE                TRICUSPID VALVE MV Area (PHT): 3.42 cm     TR Peak grad:   31.1 mmHg MV Decel Time: 222 msec     TR Vmax:        279.00 cm/s MV E velocity: 72.40 cm/s MV A velocity: 115.00 cm/s  SHUNTS MV E/A ratio:  0.63         Systemic VTI:  0.20 m                             Systemic Diam: 1.80 cm Buford Dresser MD Electronically signed by Buford Dresser MD Signature Date/Time: 05/14/2020/1:44:42 PM    Final        Medical Problem List and Plan: 1.  Decreased functional mobility secondary to left comminuted clavicle fracture and right knee contusion after fall.  Nonweightbearing.  Follow-up Dr. Mardelle Matte.  Shoulder sling as directed.  Patient was cleared to use left upper extremity  for support during ambulation with a walker but no aggressive active range of motion or passive range of motion.  -patient may shower  -ELOS/Goals: 12-14 days, supervision goals 2.  Antithrombotics: -DVT/anticoagulation: Subcutaneous heparin.  -antiplatelet therapy: N/A 3. Pain Management: Hydrocodone as needed  -local heat/ice to shoulder as needed 4. Mood: Zoloft 25 mg daily, Ativan 0.5 mg every 6 hours as needed anxiety  -antipsychotic agents: N/A 5. Neuropsych: This patient is capable of making decisions on her own behalf. 6. Skin/Wound Care: Routine skin checks 7. Fluids/Electrolytes/Nutrition: Routine in and outs with follow-up chemistries 8.  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  Lasix 40 mg daily.     -daily weights  - Cardiology service follow-up 9.  Parkinson's disease.  Sinemet 25-100 mg 2 tabs 3 times daily 10.  Hypertension.  Lisinopril 2.5 mg daily, Zebeta 2.5 mg daily 11.  Hyperlipidemia.  Pravachol 12.  Fusiform thoracic aneurysm ascending aorta, chronic and stable.  Follow-up outpatient Dr. Clydia Llano, PA-C 05/16/2020

## 2020-05-16 NOTE — TOC CAGE-AID Note (Signed)
Transition of Care [facility]) - CAGE-AID Screening   Patient Details  Name: Elizabeth Mccullough MRN: [id number] Date of Birth: 1941-05-22  Transition of [facility]) CM/SW Contact:    Emeterio Reeve, [place] Phone Number: 05/16/2020, 4:36 PM   Clinical Narrative:  CSW met with pt at bedside. CSW introduced self and explained role at the hospital.  Pt denies alcohol use. Pt denies substance use. Pt did not need any resources at this time.    CAGE-AID Screening:    Have You Ever Felt You Ought to Cut Down on Your Drinking or Drug Use?: No Have People Annoyed You By Critizing Your Drinking Or Drug Use?: No Have You Felt Bad Or Guilty About Your Drinking Or Drug Use?: No Have You Ever Had a Drink or Used Drugs First Thing In The Morning to Steady Your Nerves or to Get Rid of a Hangover?: No CAGE-AID Score: 0  Substance Abuse Education Offered: Yes    Blima Ledger, [place] Social Worker [phone]

## 2020-05-16 NOTE — Progress Notes (Signed)
Physical Therapy Treatment Patient Details Name: Elizabeth Mccullough MRN: [id number] DOB: Apr 19, 1941 Today's Date: 05/16/2020    History of Present Illness 79 yo female with onset of many falls had one result in Fracture of the distal third left clavicle with slight inferior displacement of 1/3 shaft width, associated swelling, Moderate degenerative change of L acromioclavicular and glenohumeral joints.  PMHx:  lumbar DJD, CHF, Parkinson's, CHF, AAA, carotid artery disease, R knee meniscal repair,     PT Comments    Patient progressing towards physical therapy goals. Patient overall requires modA for bed mobility and minA for transfers and ambulation. Patient ambulated 8' with RW and minA, noted shuffling gait during bwd walking with posterior LOB which required minA to recover. Patient unaware of LOB and safety with uncontrolled descent to chair. Educated patient on need for safety with OOB mobility due to increased risk of falls. Patient continues to be limited by pain, decreased activity tolerance, generalized weakness, impaired balance, and impaired functional mobility. Continue to recommend comprehensive inpatient rehab (CIR) for post-acute therapy needs.    Follow Up Recommendations  CIR     Equipment Recommendations  None recommended by PT    Recommendations for Other Services       Precautions / Restrictions Precautions Precautions: Fall Precaution Comments: Per MD, ok to use Sapphire Tygart but encourage pt to avoid pushing through L UE too much Required Braces or Orthoses: Sling Restrictions Weight Bearing Restrictions: Yes LUE Weight Bearing: Non weight bearing Other Position/Activity Restrictions: NWB in sling, but ok to use Wiletta B[place] without pushing through L UE too much    Mobility  Bed Mobility Overal bed mobility: Needs Assistance Bed Mobility: Supine to Sit     Supine to sit: Mod assist;HOB elevated     General bed mobility comments: modA to scoot forward to  EOB  Transfers Overall transfer level: Needs assistance Equipment used: Rolling Elany Felix (2 wheeled) Transfers: Sit to/from Stand Sit to Stand: Min assist         General transfer comment: minA for sit to stand, cues for using R UE to assist to standing rather than L   Ambulation/Gait Ambulation/Gait assistance: Min assist Gait Distance (Feet): 8 Feet Assistive device: Rolling Taleia Sadowski (2 wheeled) Gait Pattern/deviations: Step-to pattern;Decreased stride length;Shuffle;Wide base of support;Staggering right;Staggering left     General Gait Details: during bwd walking, patient began to demo shuffle gait with posterior LOB which required minA to recover. Decreased safety awareness with patient unaware of posterior LOB and uncontrolled descent into chair   Stairs             Wheelchair Mobility    Modified Rankin (Stroke Patients Only)       Balance Overall balance assessment: History of Falls;Needs assistance Sitting-balance support: Feet supported Sitting balance-Leahy Scale: Fair Sitting balance - Comments: fair sitting balance EOB during ADLs Postural control: Posterior lean Standing balance support: Bilateral upper extremity supported;During functional activity Standing balance-Leahy Scale: Poor                              Cognition Arousal/Alertness: Awake/alert Behavior During Therapy: Flat affect Overall Cognitive Status: History of cognitive impairments - at baseline Area of Impairment: Orientation;Attention;Memory;Following commands;Safety/judgement;Awareness;Problem solving                 Orientation Level: Person;Place;Situation Current Attention Level: Sustained Memory: Decreased recall of precautions;Decreased short-term memory Following Commands: Follows one step commands with increased time Safety/Judgement: Decreased awareness  of safety;Decreased awareness of deficits Awareness: Intellectual Problem Solving: Slow  processing;Decreased initiation;Difficulty sequencing;Requires verbal cues;Requires tactile cues General Comments: difficulty following multi-step commands, does not follow directions unless completing stopping task      Exercises General Exercises - Lower Extremity Ankle Circles/Pumps: AROM;Both;10 reps Long Arc Quad: AROM;Both;10 reps Hip Flexion/Marching: AROM;Both;10 reps;Seated    General Comments        Pertinent Vitals/Pain Pain Assessment: Faces Faces Pain Scale: Hurts a little bit Pain Location: L shoulder Pain Descriptors / Indicators: Pressure Pain Intervention(s): Limited activity within patient's tolerance;Monitored during session;Repositioned    Home Living                      Prior Function            PT Goals (current goals can now be found in the care plan section) Acute Rehab PT Goals Patient Stated Goal: Daughter wants pt to go to CIR PT Goal Formulation: With patient Time For Goal Achievement: 05/28/20 Potential to Achieve Goals: Good Progress towards PT goals: Progressing toward goals    Frequency    Min 5X/week      PT Plan Current plan remains appropriate    Co-evaluation              AM-PAC PT "6 Clicks" Mobility   Outcome Measure  Help needed turning from your back to your side while in a flat bed without using bedrails?: A Little Help needed moving from lying on your back to sitting on the side of a flat bed without using bedrails?: A Lot Help needed moving to and from a bed to a chair (including a wheelchair)?: A Little Help needed standing up from a chair using your arms (e.g., wheelchair or bedside chair)?: A Little Help needed to walk in hospital room?: A Lot Help needed climbing 3-5 steps with a railing? : Total 6 Click Score: 14    End of Session Equipment Utilized During Treatment: Gait belt Activity Tolerance: Patient tolerated treatment well Patient left: in chair;with call bell/phone within reach;with  chair alarm set Nurse Communication: Mobility status PT Visit Diagnosis: Unsteadiness on feet (R26.81);Muscle weakness (generalized) (M62.81);Repeated falls (R29.6);History of falling (Z91.81);Pain Pain - Right/Left: Left Pain - part of body: Shoulder     Time: 7096-2836 PT Time Calculation (min) (ACUTE ONLY): 26 min  Charges:  $Gait Training: 8-22 mins $Therapeutic Exercise: 8-22 mins                     Perrin Maltese, PT, DPT Acute Rehabilitation Services Pager [phone] Office [phone]    Melene Plan Allred 05/16/2020, 10:52 AM

## 2020-05-16 NOTE — Progress Notes (Signed)
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  Patient denies loss of consciousness or dizziness spells during these events.  No history of seizures or seizure-like activity, denies any headache or vision changes.  No chest pain shortness of breath or weakness.  In the ED, CT head with and without contrast negative for acute intracranial abnormality but with mild age-related atrophy and chronic microvascular ischemic changes.  X-ray left clavicle notable for comminuted displaced fracture.  Hospitalist service were consulted for further evaluation and treatment of recurrent falls and pain control regarding her clavicle fracture.   Assessment & Plan:   Principal Problem:   Displaced fracture of lateral end of left clavicle, initial encounter for closed fracture Active Problems:   Hyperlipidemia   Falls frequently   Memory deficit   Dizziness   Fall   Clavicle fracture   Recurrent falls Patient presenting with multiple falls over the previous week.  No loss of consciousness.  CT head without contrast negative for acute intracranial abnormality but does note chronic atrophy and microvascular ischemic changes.  Etiology likely secondary to gait disturbance with underlying Parkinson's disease with autonomic dysfunction. --PT/OT recommends CIR --Awaiting insurance authorization for CIR --Continue monitor on telemetry for arrhythmia  Left clavicle fracture, closed Patient presenting with fall as above.  X-ray notable  for comminuted/place fracture left clavicle.  No evidence of neurovascular compromise, no respiratory compromise, no floating shoulder or tenting of the skin above her fracture. --Orthopedics consulted, Dr. Mardelle Matte --Continue shoulder immobilizer --Hydrocodone-acetaminophen 5-325 mg q6h prn moderate pain --Fentanyl 12.5 mg IV q4h prn severe breakthrough pain --Outpatient follow-up with Dr. 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 Acie Fredrickson. Previous TTE with LVEF 55-60% 2019.  Repeat TTE 05/14/2020 with decreased LVEF to 30-35% and LV moderately decreased function, grade 1 diastolic dysfunction, moderate hypokinesis LV, anterior septal wall, inferior septal wall, inferior wall, RV systolic function normal, LA severely dilated, mild MR, moderate/severe AR --Cardiology following, appreciated assistance --High-sensitivity troponin 26>29 --Bisoprolol 2.5 mg p.o. daily --Lisinopril 5 mg p.o. daily, plan to increase to 10mg  on 10/30 --Lasix to 40 mg p.o. daily --Strict I's and O's and daily weights  Hypokalemia --Potassium 4.3 today --Repeat electrolytes in a.m. to include magnesium  Essential hypertension --Bisoprolol 2.5 mg p.o. daily --Lisinopril 2.5 mg p.o. daily --Lasix 20 mg p.o. daily  Hyperlipidemia: Continue pravastatin 40 mg p.o. daily  Depression: Continue sertraline 25 mg p.o. daily  Parkinson's disease: Continue carbidopa-levodopa ER 50-200 mg TID, outpatient follow-up with neurology Dr. Rexene Alberts  Chronic normocytic anemia Baseline hemoglobin last 5 months 9.3-10.1.  Hemoglobin on admission 10.6, at baseline.  Fusiform thoracic aneurysm ascending aorta, chronic and stable Previous diameter in 2019 4.1 x 4.3 cm.  Last seen by cardiothoracic surgery, Dr. Prescott Gum 01/11/2018.  X-ray notable for widening of the mediastinum that appears unchanged from previous  x-rays. --Continue blood pressure control as above --Outpatient follow-up   DVT prophylaxis: Heparin Code Status: Full code Family Communication: No family present at bedside this morning  Disposition Plan:  Status is: Inpatient  Remains inpatient appropriate because:Persistent severe electrolyte disturbances, Ongoing diagnostic testing needed not appropriate for outpatient work up, Unsafe d/c plan, IV treatments appropriate due to intensity of illness or inability to take PO and Inpatient level of care appropriate due to severity of illness   Dispo: The patient is from: Home              Anticipated d/c is to: CIR              Anticipated d/c date is: 1 day              Patient currently is not medically stable to d/c.    Consultants:   Cardiology  Procedures:  TTE 05/14/2020: IMPRESSIONS    1. Left ventricular ejection fraction, by estimation, is 30 to 35%. The  left ventricle has moderately decreased function. The left ventricle  demonstrates regional wall motion abnormalities (see scoring  diagram/findings for description). Left ventricular  diastolic parameters are consistent with Grade I diastolic dysfunction  (impaired relaxation). There is moderate hypokinesis of the left  ventricular, entire anteroseptal wall, inferoseptal wall and inferior  wall. Hypokinesis of the septal/inferior walls,  preserved at apex.  2. Right ventricular systolic function is normal. The right ventricular  size is normal. There is normal pulmonary artery systolic pressure.  3. Left atrial size was severely dilated.  4. The mitral valve is normal in structure. Mild mitral valve  regurgitation.  5. The aortic valve is tricuspid. There is mild calcification of the  aortic valve. There is mild thickening of the aortic valve. Aortic valve  regurgitation is moderate to severe. Mild to moderate aortic valve  sclerosis/calcification is present, without  any evidence of aortic stenosis.    Antimicrobials:   None   Subjective: Patient seen and examined at bedside, resting comfortably.  Lying in bed.  No complaints.    Awaiting insurance authorization for CIR.  cardiology increased Lasix to 40 mg p.o. daily today. Denies headache, no dizziness, no chest pain, palpitations, no shortness of breath, no abdominal pain, no weakness, no fatigue, no paresthesias.  No acute events overnight per nursing staff.  Objective: Vitals:   05/15/20 2004 05/16/20 0444 05/16/20 0931 05/16/20 0934  BP: (!) 150/74 (!) 161/70 (!) 173/73 (!) 173/73  Pulse: 75 81  74  Resp: 16 15  18   Temp: 97.8 F (36.6 C) 98.4 F (36.9 C)  98.8 F (37.1 C)  TempSrc: Oral Oral  Oral  SpO2: 97% 94%  98%  Weight:      Height:        Intake/Output Summary (Last 24 hours) at 05/16/2020 1241 Last data filed at 05/16/2020 0500 Gross per 24 hour  Intake --  Output 1000 ml  Net -1000 ml   Filed Weights   05/13/20 1728  Weight: 59 kg    Examination:  General exam: Appears calm and comfortable, elderly in appearance Respiratory system: Clear to auscultation. Respiratory effort normal.  Oxygenating well on room air Cardiovascular system: S1 & S2 heard, RRR. 3/6 diastolic murmur noted, no JVD, rubs, gallops or clicks. No pedal edema. Gastrointestinal system: Abdomen is nondistended, soft and nontender. No organomegaly or masses felt. Normal bowel sounds heard. Central nervous system: Alert and oriented. No focal neurological deficits. Extremities: Symmetric 5 x 5 power. Skin: Ecchymosis noted left shoulder/clavicle region, right knee, otherwise no rashes, lesions or ulcers Psychiatry: Judgement and insight appear normal. Mood & affect appropriate.     Data Reviewed: I have personally reviewed following  labs and imaging studies  CBC: Recent Labs  Lab 05/13/20 1854 05/14/20 0316  WBC 7.9 6.4  NEUTROABS 5.5  --   HGB 10.6* 9.9*  HCT 33.6* 30.1*  MCV 99.4 98.0  PLT 329 092   Basic Metabolic  Panel: Recent Labs  Lab 05/13/20 1854 05/14/20 0316 05/15/20 0348 05/16/20 0457  NA 142 141 141 140  K 3.5 3.1* 4.3 3.9  CL 105 104 105 104  CO2 26 30 26 27   GLUCOSE 100* 90 92 101*  BUN 21 17 19 16   CREATININE 0.88 0.80 0.80 0.82  CALCIUM 9.0 8.8* 8.6* 9.0  MG  --   --  1.9 1.9   GFR: Estimated Creatinine Clearance: 42.6 mL/min (by C-G formula based on SCr of 0.82 mg/dL). Liver Function Tests: Recent Labs  Lab 05/13/20 1854  AST 24  ALT 13  ALKPHOS 62  BILITOT 0.8  PROT 6.4*  ALBUMIN 3.8   No results for input(s): LIPASE, AMYLASE in the last 168 hours. No results for input(s): AMMONIA in the last 168 hours. Coagulation Profile: No results for input(s): INR, PROTIME in the last 168 hours. Cardiac Enzymes: No results for input(s): CKTOTAL, CKMB, CKMBINDEX, TROPONINI in the last 168 hours. BNP (last 3 results) No results for input(s): PROBNP in the last 8760 hours. HbA1C: No results for input(s): HGBA1C in the last 72 hours. CBG: No results for input(s): GLUCAP in the last 168 hours. Lipid Profile: No results for input(s): CHOL, HDL, LDLCALC, TRIG, CHOLHDL, LDLDIRECT in the last 72 hours. Thyroid Function Tests: Recent Labs    05/13/20 2244  TSH 2.076   Anemia Panel: No results for input(s): VITAMINB12, FOLATE, FERRITIN, TIBC, IRON, RETICCTPCT in the last 72 hours. Sepsis Labs: No results for input(s): PROCALCITON, LATICACIDVEN in the last 168 hours.  Recent Results (from the past 240 hour(s))  Respiratory Panel by RT PCR (Flu A&B, Covid) - Nasopharyngeal Swab     Status: None   Collection Time: 05/13/20  9:38 PM   Specimen: Nasopharyngeal Swab  Result Value Ref Range Status   SARS Coronavirus 2 by RT PCR NEGATIVE NEGATIVE Final    Comment: (NOTE) SARS-CoV-2 target nucleic acids are NOT DETECTED.  The SARS-CoV-2 RNA is generally detectable in upper respiratoy specimens during the acute phase of infection. The lowest concentration of SARS-CoV-2 viral  copies this assay can detect is 131 copies/mL. A negative result does not preclude SARS-Cov-2 infection and should not be used as the sole basis for treatment or other patient management decisions. A negative result may occur with  improper specimen collection/handling, submission of specimen other than nasopharyngeal swab, presence of viral mutation(s) within the areas targeted by this assay, and inadequate number of viral copies (<131 copies/mL). A negative result must be combined with clinical observations, patient history, and epidemiological information. The expected result is Negative.  Fact Sheet for Patients:  PinkCheek.be  Fact Sheet for Healthcare Providers:  GravelBags.it  This test is no t yet approved or cleared by the [place] FDA and  has been authorized for detection and/or diagnosis of SARS-CoV-2 by FDA under an Emergency Use Authorization (EUA). This EUA will remain  in effect (meaning this test can be used) for the duration of the COVID-19 declaration under Section 564(b)(1) of the Act, 21 U.S.C. section 360bbb-3(b)(1), unless the authorization is terminated or revoked sooner.     Influenza A by PCR NEGATIVE NEGATIVE Final   Influenza B by PCR NEGATIVE NEGATIVE Final    Comment: (NOTE)  The Xpert Xpress SARS-CoV-2/FLU/RSV assay is intended as an aid in  the diagnosis of influenza from Nasopharyngeal swab specimens and  should not be used as a sole basis for treatment. Nasal washings and  aspirates are unacceptable for Xpert Xpress SARS-CoV-2/FLU/RSV  testing.  Fact Sheet for Patients: PinkCheek.be  Fact Sheet for Healthcare Providers: GravelBags.it  This test is not yet approved or cleared by the [place] FDA and  has been authorized for detection and/or diagnosis of SARS-CoV-2 by  FDA under an Emergency Use Authorization (EUA). This  EUA will remain  in effect (meaning this test can be used) for the duration of the  Covid-19 declaration under Section 564(b)(1) of the Act, 21  U.S.C. section 360bbb-3(b)(1), unless the authorization is  terminated or revoked. Performed at [facility], [place] [address] [place], [place] [address]          Radiology Studies: No results found.      Scheduled Meds: . bisoprolol  2.5 mg Oral Daily  . Bromfenac Sodium  1 drop Right Eye Daily  . Carbidopa-Levodopa ER  2 tablet Oral TID  . furosemide  40 mg Oral Daily  . heparin injection (subcutaneous)  5,000 Units Subcutaneous Q8H  . [START ON 05/17/2020] lisinopril  10 mg Oral Daily  . lisinopril  5 mg Oral Once  . moxifloxacin  1 drop Right Eye TID  . pravastatin  40 mg Oral q1800  . prednisoLONE acetate  1 drop Right Eye TID   Followed by  . [START ON 05/22/2020] prednisoLONE acetate  1 drop Right Eye BID   Followed by  . [START ON 05/29/2020] prednisoLONE acetate  1 drop Right Eye Daily  . sertraline  25 mg Oral Daily   Continuous Infusions:   LOS: 2 days    Time spent: 36 minutes spent on chart review, discussion with nursing staff, consultants, updating family and interview/physical exam; more than 50% of that time was spent in counseling and/or coordination of care.    Taralynn Quiett J [place] DO [facility] Available via Epic secure chat 7am-7pm After these hours, please refer to coverage provider listed on [email or web address] 05/16/2020, 12:41 PM

## 2020-05-16 NOTE — Progress Notes (Signed)
Progress Note  Patient Name: Elizabeth Mccullough Date of Encounter: 05/16/2020  [facility] Cardiologist: Mertie Moores, MD   Subjective  No events overnight. Denies chest pain or SOB. Feels tired. Discharging today.  Inpatient Medications    Scheduled Meds: . bisoprolol  2.5 mg Oral Daily  . Bromfenac Sodium  1 drop Right Eye Daily  . Carbidopa-Levodopa ER  2 tablet Oral TID  . furosemide  40 mg Oral Daily  . heparin injection (subcutaneous)  5,000 Units Subcutaneous Q8H  . [START ON 05/17/2020] lisinopril  10 mg Oral Daily  . moxifloxacin  1 drop Right Eye TID  . pravastatin  40 mg Oral q1800  . prednisoLONE acetate  1 drop Right Eye TID   Followed by  . [START ON 05/22/2020] prednisoLONE acetate  1 drop Right Eye BID   Followed by  . [START ON 05/29/2020] prednisoLONE acetate  1 drop Right Eye Daily  . sertraline  25 mg Oral Daily   Continuous Infusions:  PRN Meds: acetaminophen, HYDROcodone-acetaminophen, LORazepam   Vital Signs    Vitals:   05/16/20 0444 05/16/20 0931 05/16/20 0934 05/16/20 1704  BP: (!) 161/70 (!) 173/73 (!) 173/73 (!) 166/70  Pulse: 81  74 78  Resp: 15  18 18   Temp: 98.4 F (36.9 C)  98.8 F (37.1 C) 99 F (37.2 C)  TempSrc: Oral  Oral Oral  SpO2: 94%  98% 99%  Weight:      Height:        Intake/Output Summary (Last 24 hours) at 05/16/2020 1911 Last data filed at 05/16/2020 0500 Gross per 24 hour  Intake --  Output 1000 ml  Net -1000 ml   Last 3 Weights 05/13/2020 02/05/2020 12/18/2019  Weight (lbs) 130 lb 131 lb 9.6 oz 128 lb 4.9 oz  Weight (kg) 58.968 kg 59.693 kg 58.2 kg      Telemetry    Not on telemetry - Personally Reviewed  ECG    No new ECG - Personally Reviewed  Physical Exam   GEN: elderly lady in NAD, soft spoken   Neck: No JVD Cardiac: RRR, 2/6 systolic murmur best heard at RUSB Respiratory: Clear to auscultation bilaterally. GI: Soft, nontender, non-distended  MS: Warm, no edema Neuro:  Slowed  speech due to parkinsons Psych: Normal affect   Labs    High Sensitivity Troponin:   Recent Labs  Lab 05/13/20 1854 05/13/20 2102  TROPONINIHS 26* 29*      Chemistry Recent Labs  Lab 05/13/20 1854 05/13/20 1854 05/14/20 0316 05/15/20 0348 05/16/20 0457  NA 142   < > 141 141 140  K 3.5   < > 3.1* 4.3 3.9  CL 105   < > 104 105 104  CO2 26   < > 30 26 27   GLUCOSE 100*   < > 90 92 101*  BUN 21   < > 17 19 16   CREATININE 0.88   < > 0.80 0.80 0.82  CALCIUM 9.0   < > 8.8* 8.6* 9.0  PROT 6.4*  --   --   --   --   ALBUMIN 3.8  --   --   --   --   AST 24  --   --   --   --   ALT 13  --   --   --   --   ALKPHOS 62  --   --   --   --   BILITOT 0.8  --   --   --   --  GFRNONAA >60   < > >60 >60 >60  ANIONGAP 11   < > 7 10 9    < > = values in this interval not displayed.     Hematology Recent Labs  Lab 05/13/20 1854 05/14/20 0316  WBC 7.9 6.4  RBC 3.38* 3.07*  HGB 10.6* 9.9*  HCT 33.6* 30.1*  MCV 99.4 98.0  MCH 31.4 32.2  MCHC 31.5 32.9  RDW 12.9 12.9  PLT 329 294    BNPNo results for input(s): BNP, PROBNP in the last 168 hours.   DDimer No results for input(s): DDIMER in the last 168 hours.   Radiology    No results found.  Cardiac Studies   Echo 05/14/20: 1. Left ventricular ejection fraction, by estimation, is 30 to 35%. The  left ventricle has moderately decreased function. The left ventricle  demonstrates regional wall motion abnormalities (see scoring  diagram/findings for description). Left ventricular  diastolic parameters are consistent with Grade I diastolic dysfunction  (impaired relaxation). There is moderate hypokinesis of the left  ventricular, entire anteroseptal wall, inferoseptal wall and inferior  wall. Hypokinesis of the septal/inferior walls,  preserved at apex.  2. Right ventricular systolic function is normal. The right ventricular  size is normal. There is normal pulmonary artery systolic pressure.  3. Left atrial size was  severely dilated.  4. The mitral valve is normal in structure. Mild mitral valve  regurgitation.  5. The aortic valve is tricuspid. There is mild calcification of the  aortic valve. There is mild thickening of the aortic valve. Aortic valve  regurgitation is moderate to severe. Mild to moderate aortic valve  sclerosis/calcification is present, without  any evidence of aortic stenosis.   Patient Profile     79 y.o. female with history of NICM in 2018 with EF 20-25%, moderate MR and mod AR, EF improved with medication and 07/2017 EF 55-60%, Parkinson's disease, fusiform ascending aneurysm and mild carotid diseasewho is being seen for the evaluation of CHF and abnormal Echo.   Assessment & Plan    #NICM #Acute on chronic systolic and diastolic heart failure TTE with LVEF 30-35%. Has hypokinesis of the septal/inferior walls but cath 2018 with normal coronaries -Continue lasix 40mg  Po daily -Continue bisoprolol -Continue increased dose of lisinopril 10mg  daily; may need up-titration further as out-patient. 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 LVIDs 3.5cm; LVIDd 4.9cm. -Will need close follow-up as out-patient -Continue lasix -Continue BP management with lisinopril  #Parkinsons Complicated by orthostatic hypotension and falls -Follow-up with primary providers as out-patient  For questions or updates, please contact [facility] Please consult [email or web address] for contact info under        Signed, Freada Bergeron, MD  05/16/2020, 7:11 PM

## 2020-05-17 ENCOUNTER — Inpatient Hospital Stay (HOSPITAL_COMMUNITY)
Admission: AD | Admit: 2020-05-17 | Discharge: 2020-05-31 | DRG: 560 | Disposition: A | Discharge status: Home / Self Care | Payer: Medicare Other | Source: Intra-hospital | Attending: Physical Medicine & Rehabilitation | Admitting: Physical Medicine & Rehabilitation

## 2020-05-17 ENCOUNTER — Other Ambulatory Visit: Payer: Self-pay

## 2020-05-17 ENCOUNTER — Encounter (HOSPITAL_COMMUNITY): Payer: Self-pay | Admitting: Physical Medicine & Rehabilitation

## 2020-05-17 DIAGNOSIS — I712 Thoracic aortic aneurysm, without rupture: Secondary | ICD-10-CM | POA: Diagnosis present

## 2020-05-17 DIAGNOSIS — I10 Essential (primary) hypertension: Secondary | ICD-10-CM | POA: Diagnosis not present

## 2020-05-17 DIAGNOSIS — F419 Anxiety disorder, unspecified: Secondary | ICD-10-CM | POA: Diagnosis not present

## 2020-05-17 DIAGNOSIS — Z7982 Long term (current) use of aspirin: Secondary | ICD-10-CM

## 2020-05-17 DIAGNOSIS — I5042 Chronic combined systolic (congestive) and diastolic (congestive) heart failure: Secondary | ICD-10-CM | POA: Diagnosis not present

## 2020-05-17 DIAGNOSIS — S42002S Fracture of unspecified part of left clavicle, sequela: Secondary | ICD-10-CM | POA: Diagnosis not present

## 2020-05-17 DIAGNOSIS — M898X1 Other specified disorders of bone, shoulder: Secondary | ICD-10-CM

## 2020-05-17 DIAGNOSIS — R296 Repeated falls: Secondary | ICD-10-CM | POA: Diagnosis present

## 2020-05-17 DIAGNOSIS — G479 Sleep disorder, unspecified: Secondary | ICD-10-CM

## 2020-05-17 DIAGNOSIS — Z79899 Other long term (current) drug therapy: Secondary | ICD-10-CM | POA: Diagnosis not present

## 2020-05-17 DIAGNOSIS — I110 Hypertensive heart disease with heart failure: Secondary | ICD-10-CM | POA: Diagnosis not present

## 2020-05-17 DIAGNOSIS — S8001XD Contusion of right knee, subsequent encounter: Secondary | ICD-10-CM | POA: Diagnosis not present

## 2020-05-17 DIAGNOSIS — S42009S Fracture of unspecified part of unspecified clavicle, sequela: Secondary | ICD-10-CM | POA: Diagnosis not present

## 2020-05-17 DIAGNOSIS — K5900 Constipation, unspecified: Secondary | ICD-10-CM | POA: Diagnosis present

## 2020-05-17 DIAGNOSIS — Z8249 Family history of ischemic heart disease and other diseases of the circulatory system: Secondary | ICD-10-CM | POA: Diagnosis not present

## 2020-05-17 DIAGNOSIS — M25551 Pain in right hip: Secondary | ICD-10-CM | POA: Diagnosis not present

## 2020-05-17 DIAGNOSIS — S42032A Displaced fracture of lateral end of left clavicle, initial encounter for closed fracture: Secondary | ICD-10-CM | POA: Diagnosis not present

## 2020-05-17 DIAGNOSIS — Z91041 Radiographic dye allergy status: Secondary | ICD-10-CM | POA: Diagnosis not present

## 2020-05-17 DIAGNOSIS — R54 Age-related physical debility: Secondary | ICD-10-CM | POA: Diagnosis present

## 2020-05-17 DIAGNOSIS — Z880 Allergy status to penicillin: Secondary | ICD-10-CM

## 2020-05-17 DIAGNOSIS — R269 Unspecified abnormalities of gait and mobility: Secondary | ICD-10-CM

## 2020-05-17 DIAGNOSIS — I5023 Acute on chronic systolic (congestive) heart failure: Secondary | ICD-10-CM | POA: Diagnosis not present

## 2020-05-17 DIAGNOSIS — W1830XD Fall on same level, unspecified, subsequent encounter: Secondary | ICD-10-CM | POA: Diagnosis not present

## 2020-05-17 DIAGNOSIS — E876 Hypokalemia: Secondary | ICD-10-CM | POA: Diagnosis not present

## 2020-05-17 DIAGNOSIS — F32A Depression, unspecified: Secondary | ICD-10-CM | POA: Diagnosis not present

## 2020-05-17 DIAGNOSIS — I351 Nonrheumatic aortic (valve) insufficiency: Secondary | ICD-10-CM | POA: Diagnosis present

## 2020-05-17 DIAGNOSIS — N179 Acute kidney failure, unspecified: Secondary | ICD-10-CM

## 2020-05-17 DIAGNOSIS — I5032 Chronic diastolic (congestive) heart failure: Secondary | ICD-10-CM | POA: Diagnosis not present

## 2020-05-17 DIAGNOSIS — I428 Other cardiomyopathies: Secondary | ICD-10-CM | POA: Diagnosis present

## 2020-05-17 DIAGNOSIS — I5043 Acute on chronic combined systolic (congestive) and diastolic (congestive) heart failure: Secondary | ICD-10-CM | POA: Diagnosis not present

## 2020-05-17 DIAGNOSIS — E785 Hyperlipidemia, unspecified: Secondary | ICD-10-CM | POA: Diagnosis present

## 2020-05-17 DIAGNOSIS — S8001XA Contusion of right knee, initial encounter: Secondary | ICD-10-CM | POA: Diagnosis not present

## 2020-05-17 DIAGNOSIS — G20 Parkinson's disease: Secondary | ICD-10-CM | POA: Diagnosis present

## 2020-05-17 DIAGNOSIS — D649 Anemia, unspecified: Secondary | ICD-10-CM | POA: Diagnosis not present

## 2020-05-17 DIAGNOSIS — I714 Abdominal aortic aneurysm, without rupture: Secondary | ICD-10-CM | POA: Diagnosis present

## 2020-05-17 DIAGNOSIS — Z20822 Contact with and (suspected) exposure to covid-19: Secondary | ICD-10-CM | POA: Diagnosis not present

## 2020-05-17 DIAGNOSIS — S42002D Fracture of unspecified part of left clavicle, subsequent encounter for fracture with routine healing: Secondary | ICD-10-CM | POA: Diagnosis not present

## 2020-05-17 DIAGNOSIS — K5901 Slow transit constipation: Secondary | ICD-10-CM | POA: Diagnosis not present

## 2020-05-17 DIAGNOSIS — G20A1 Parkinson's disease without dyskinesia, without mention of fluctuations: Secondary | ICD-10-CM

## 2020-05-17 DIAGNOSIS — S42009A Fracture of unspecified part of unspecified clavicle, initial encounter for closed fracture: Secondary | ICD-10-CM | POA: Diagnosis present

## 2020-05-17 DIAGNOSIS — I951 Orthostatic hypotension: Secondary | ICD-10-CM | POA: Diagnosis not present

## 2020-05-17 LAB — CBC
HCT: 35.1 % — ABNORMAL LOW (ref 36.0–46.0)
Hemoglobin: 11.4 g/dL — ABNORMAL LOW (ref 12.0–15.0)
MCH: 31.8 pg (ref 26.0–34.0)
MCHC: 32.5 g/dL (ref 30.0–36.0)
MCV: 98 fL (ref 80.0–100.0)
Platelets: 360 10*3/uL (ref 150–400)
RBC: 3.58 MIL/uL — ABNORMAL LOW (ref 3.87–5.11)
RDW: 12.6 % (ref 11.5–15.5)
WBC: 7.4 10*3/uL (ref 4.0–10.5)
nRBC: 0 % (ref 0.0–0.2)

## 2020-05-17 LAB — CREATININE, SERUM
Creatinine, Ser: 0.94 mg/dL (ref 0.44–1.00)
GFR, Estimated: >60 mL/min (ref >60)

## 2020-05-17 MED ORDER — LISINOPRIL 10 MG PO TABS
10.0000 mg | ORAL_TABLET | Freq: Every day | ORAL | Status: DC
  Administered 2020-05-18 – 2020-05-31 (×13): 10 mg via ORAL
  Filled 2020-05-17 (×14): qty 1

## 2020-05-17 MED ORDER — PREDNISOLONE ACETATE 1 % OP SUSP: 1.0000 [drp] | Freq: Every day | OPHTHALMIC | 0 refills | Status: DC

## 2020-05-17 MED ORDER — SERTRALINE HCL 50 MG PO TABS
25.0000 mg | ORAL_TABLET | Freq: Every day | ORAL | Status: DC
  Administered 2020-05-18 – 2020-05-27 (×10): 25 mg via ORAL
  Filled 2020-05-17 (×10): qty 1

## 2020-05-17 MED ORDER — FUROSEMIDE 40 MG PO TABS
40.0000 mg | ORAL_TABLET | Freq: Every day | ORAL | Status: DC
Start: 2020-05-18 — End: 2020-05-28

## 2020-05-17 MED ORDER — HYDROCODONE-ACETAMINOPHEN 5-325 MG PO TABS
1.0000 | ORAL_TABLET | Freq: Four times a day (QID) | ORAL | Status: DC | PRN
  Administered 2020-05-18 – 2020-05-31 (×3): 1 via ORAL
  Filled 2020-05-17 (×4): qty 1

## 2020-05-17 MED ORDER — PREDNISOLONE ACETATE 1 % OP SUSP
1.0000 [drp] | Freq: Two times a day (BID) | OPHTHALMIC | 0 refills | Status: DC
Start: 2020-05-22 — End: 2020-05-31

## 2020-05-17 MED ORDER — HEPARIN SODIUM (PORCINE) 5000 UNIT/ML IJ SOLN
5000.0000 [IU] | Freq: Three times a day (TID) | INTRAMUSCULAR | Status: DC
  Administered 2020-05-17 – 2020-05-31 (×41): 5000 [IU] via SUBCUTANEOUS
  Filled 2020-05-17 (×41): qty 1

## 2020-05-17 MED ORDER — PREDNISOLONE ACETATE 1 % OP SUSP
1.0000 [drp] | Freq: Three times a day (TID) | OPHTHALMIC | 0 refills | Status: DC
Start: 2020-05-17 — End: 2020-05-31

## 2020-05-17 MED ORDER — ACETAMINOPHEN 325 MG PO TABS
650.0000 mg | ORAL_TABLET | Freq: Four times a day (QID) | ORAL | Status: DC | PRN
  Administered 2020-05-18 – 2020-05-30 (×19): 650 mg via ORAL
  Filled 2020-05-17 (×19): qty 2

## 2020-05-17 MED ORDER — PREDNISOLONE ACETATE 1 % OP SUSP
1.0000 [drp] | Freq: Every day | OPHTHALMIC | Status: DC
  Administered 2020-05-31: 1 [drp] via OPHTHALMIC

## 2020-05-17 MED ORDER — PREDNISOLONE ACETATE 1 % OP SUSP
1.0000 [drp] | Freq: Three times a day (TID) | OPHTHALMIC | Status: AC
  Administered 2020-05-17 – 2020-05-23 (×17): 1 [drp] via OPHTHALMIC

## 2020-05-17 MED ORDER — LORAZEPAM 0.5 MG PO TABS: 0.5000 mg | ORAL_TABLET | Freq: Four times a day (QID) | ORAL | 0 refills | Status: DC | PRN

## 2020-05-17 MED ORDER — HEPARIN SODIUM (PORCINE) 5000 UNIT/ML IJ SOLN: 5000.0000 [IU] | Freq: Three times a day (TID) | INTRAMUSCULAR | Status: DC

## 2020-05-17 MED ORDER — HYDROCODONE-ACETAMINOPHEN 5-325 MG PO TABS: 1.0000 | ORAL_TABLET | Freq: Four times a day (QID) | ORAL | 0 refills | Status: DC | PRN

## 2020-05-17 MED ORDER — PRAVASTATIN SODIUM 40 MG PO TABS
40.0000 mg | ORAL_TABLET | Freq: Every day | ORAL | Status: DC
  Administered 2020-05-17 – 2020-05-30 (×14): 40 mg via ORAL
  Filled 2020-05-17 (×14): qty 1

## 2020-05-17 MED ORDER — CARBIDOPA-LEVODOPA ER 25-100 MG PO TBCR
2.0000 | EXTENDED_RELEASE_TABLET | Freq: Three times a day (TID) | ORAL | Status: DC
  Administered 2020-05-17 – 2020-05-31 (×42): 2 via ORAL
  Filled 2020-05-17 (×43): qty 2

## 2020-05-17 MED ORDER — FUROSEMIDE 40 MG PO TABS
40.0000 mg | ORAL_TABLET | Freq: Every day | ORAL | Status: DC
  Administered 2020-05-18 – 2020-05-31 (×14): 40 mg via ORAL
  Filled 2020-05-17 (×11): qty 1
  Filled 2020-05-17: qty 2
  Filled 2020-05-17 (×2): qty 1

## 2020-05-17 MED ORDER — LISINOPRIL 10 MG PO TABS: 10.0000 mg | ORAL_TABLET | Freq: Every day | ORAL | Status: DC

## 2020-05-17 MED ORDER — BISOPROLOL FUMARATE 5 MG PO TABS
2.5000 mg | ORAL_TABLET | Freq: Every day | ORAL | Status: DC
  Filled 2020-05-17: qty 0.5

## 2020-05-17 MED ORDER — LORAZEPAM 0.5 MG PO TABS
0.5000 mg | ORAL_TABLET | Freq: Four times a day (QID) | ORAL | Status: DC | PRN
  Administered 2020-05-17 – 2020-05-18 (×2): 0.5 mg via ORAL
  Filled 2020-05-17 (×2): qty 1

## 2020-05-17 MED ORDER — PREDNISOLONE ACETATE 1 % OP SUSP
1.0000 [drp] | Freq: Two times a day (BID) | OPHTHALMIC | Status: AC
  Administered 2020-05-23 – 2020-05-30 (×14): 1 [drp] via OPHTHALMIC

## 2020-05-17 MED ORDER — BISOPROLOL FUMARATE 5 MG PO TABS
2.5000 mg | ORAL_TABLET | Freq: Every day | ORAL | Status: DC
  Administered 2020-05-18 – 2020-05-31 (×14): 2.5 mg via ORAL
  Filled 2020-05-17 (×14): qty 0.5

## 2020-05-17 NOTE — Progress Notes (Signed)
Inpatient Rehabilitation Medication Review by a Pharmacist  A complete drug regimen review was completed for this patient to identify any potential clinically significant medication issues.  Clinically significant medication issues were identified:  possible   Type of Medication Issue Identified Description of Issue Plan   Drug Interaction(s) (clinically significant)      Duplicate Therapy      Allergy      No Medication Administration End Date      Incorrect Dose      Additional Drug Therapy Needed      Other  Bromfenac and moxicloxacin Eye drops not reordered  F/u with team in morning     Pharmacist comments: unclear indication and plan for above eye drops. Possibly completed course. Non urgent. Follow up with team in the morning.     Time spent performing this drug regimen review (minutes):  [place], PharmD, [place], [facility] Clinical Pharmacist  Please check AMION for all [place] phone numbers After 10:00 PM, call Register [phone]

## 2020-05-17 NOTE — Discharge Summary (Signed)
Physician Discharge Summary  Elizabeth Mccullough HYQ:[id number] DOB: May 26, 1941 DOA: 05/13/2020  PCP: Ann Held, DO  Admit date: 05/13/2020 Discharge date: 05/17/2020  Admitted From: Home  Disposition: CIR  Recommendations for Outpatient Follow-up:  1. Follow up with PCP in 1-2 weeks 2. Follow-up with orthopedics, Dr. Mardelle Matte in 1 week 3. Follow-up with cardiology, Dr. Acie Fredrickson in 1-2 months 4. Follow-up with cardiothoracic surgery, Dr. Darcey Nora in 2 months for further surveillance of thoracic aneurysm 5. Please obtain BMP in one week   Discharge Condition: Stable CODE STATUS: Full code Diet recommendation: Heart healthy diet  History of present illness:  Elizabeth Mccullough is a 79 year old female with past medical history notable for essential hypertension, Parkinson's disease, combined chronic systolic/diastolic congestive heart failure, hyperlipidemia, fusiform ascending aortic aneurysm, carotid artery disease, moderate aortic insufficiency with mild dilation of the aortic root who presented from her group home with recurrent falls.  Patient denies loss of consciousness or dizziness spells during these events.  No history of seizures or seizure-like activity, denies any headache or vision changes.  No chest pain shortness of breath or weakness.  In the ED, CT head with and without contrast negative for acute intracranial abnormality but with mild age-related atrophy and chronic microvascular ischemic changes.  X-ray left clavicle notable for comminuted displaced fracture.  Hospitalist service were consulted for further evaluation and treatment of recurrent falls and pain control regarding her clavicle fracture.  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  No loss of consciousness.  CT head without contrast negative for acute intracranial abnormality but  does note chronic atrophy and microvascular ischemic changes.  Etiology likely secondary to gait disturbance with underlying Parkinson's disease with autonomic dysfunction.  Patient followed by PT and OT during hospitalization and will discharge to [facility] inpatient rehabilitation.  Left clavicle fracture, closed Patient presenting with fall as above.  X-ray notable for comminuted/place fracture left clavicle.  No evidence of neurovascular compromise, no respiratory compromise, no floating shoulder or tenting of the skin above her fracture. Orthopedics consulted, Dr. Mardelle Matte with recommendation of nonoperative management with sling/shoulder immobilizer.  Continue pain control.  Outpatient follow-up with Dr. Mardelle Matte 1-2 weeks.  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 Acie Fredrickson. Previous TTE with LVEF 55-60% 2019.  Repeat TTE 05/14/2020 with decreased LVEF to 30-35% and LV moderately decreased function, grade 1 diastolic dysfunction, moderate hypokinesis LV, anterior septal wall, inferior septal wall, inferior wall, RV systolic function normal, LA severely dilated, mild MR, moderate/severe AR.  Cardiology was consulted and followed during hospital course.  High sensitive troponin remained flat at 26, 29.  Patient continued on bisoprolol 2.5 mg p.o. daily and started on lisinopril which was uptitrated to 10 mg p.o. daily.  Continue Lasix at 40 mg p.o. daily.  Outpatient follow-up with cardiology.  Hypokalemia Repleted during hospitalization.  Continue home potassium chloride 10 mEq p.o. daily.  Recommend BMP 1 week.  Essential hypertension Bisoprolol 2.5 mg p.o. daily, Lisinopril 10 mg p.o. daily, Lasix 40 mg p.o. daily  Hyperlipidemia: Continue pravastatin 40 mg p.o. daily  Depression: Continue sertraline 25 mg p.o. daily  Parkinson's disease: Continue carbidopa-levodopa ER 50-200 mg TID, outpatient follow-up  with neurology Dr. Rexene Alberts  Chronic normocytic anemia Baseline hemoglobin last 5 months 9.3-10.1.  Hemoglobin on admission 10.6, at baseline.  Fusiform thoracic aneurysm ascending aorta, chronic and stable Previous diameter in  2019 4.1 x 4.3 cm.  Last seen by cardiothoracic surgery, Dr. Prescott Gum 01/11/2018.  X-ray notable for widening of the mediastinum that appears unchanged from previous x-rays. Continue blood pressure control as above. Outpatient follow-up  Discharge Diagnoses:  Principal Problem:   Displaced fracture of lateral end of left clavicle, initial encounter for closed fracture Active Problems:   Hyperlipidemia   Falls frequently   Memory deficit   Dizziness   Fall   Clavicle fracture    Discharge Instructions  Discharge Instructions    Diet - low sodium heart healthy   Complete by: As directed    Increase activity slowly   Complete by: As directed      Allergies as of 05/17/2020      Reactions   Iohexol Rash    Code: RASH, Desc: PATIENT STATES SHE IS ALLERGIC TO IV DYE. 20 YRS AGO SHE HAD A REACTION AT [facility], PT WAS GIVEN BENADRYL. 07/13/06/RM, Onset Date: [id number]   Penicillins Hives      Medication List    TAKE these medications   Acetaminophen 325 MG Caps Take 1-2 capsules by mouth every 8 (eight) hours as needed (moderate to severe pain).   aspirin EC 81 MG tablet Take 1 tablet (81 mg total) by mouth daily.   bisoprolol 5 MG tablet Commonly known as: ZEBETA Take 0.5 tablets (2.5 mg total) by mouth daily.   Carbidopa-Levodopa ER 25-100 MG tablet controlled release Commonly known as: SINEMET CR TAKE 2 TABLETS BY MOUTH 3 TIMES A DAY What changed:   when to take this  additional instructions   furosemide 40 MG tablet Commonly known as: LASIX Take 1 tablet (40 mg total) by mouth daily. Start taking on: May 18, 2020 What changed:   medication strength  how much to take   HYDROcodone-acetaminophen 5-325 MG tablet Commonly  known as: NORCO/VICODIN Take 1 tablet by mouth every 6 (six) hours as needed for moderate pain.   ibuprofen 200 MG tablet Commonly known as: ADVIL Take 400 mg by mouth every 6 (six) hours as needed for moderate pain.   lisinopril 10 MG tablet Commonly known as: ZESTRIL Take 1 tablet (10 mg total) by mouth daily. Start taking on: May 18, 2020   LORazepam 0.5 MG tablet Commonly known as: ATIVAN Take 1 tablet (0.5 mg total) by mouth every 6 (six) hours as needed for anxiety.   lovastatin 20 MG tablet Commonly known as: MEVACOR TAKE 1 TABLET BY MOUTH AT BEDTIME.   moxifloxacin 0.5 % ophthalmic solution Commonly known as: VIGAMOX Place 1 drop into the right eye in the morning, at noon, in the evening, and at bedtime.   potassium chloride 10 MEQ tablet Commonly known as: KLOR-CON Take 1 tablet (10 mEq total) by mouth daily.   prednisoLONE acetate 1 % ophthalmic suspension Commonly known as: PRED FORTE Place 1 drop into the right eye 3 (three) times daily. What changed: when to take this   prednisoLONE acetate 1 % ophthalmic suspension Commonly known as: PRED FORTE Place 1 drop into the right eye 2 (two) times daily. Start taking on: May 22, 2020 What changed: You were already taking a medication with the same name, and this prescription was added. Make sure you understand how and when to take each.   prednisoLONE acetate 1 % ophthalmic suspension Commonly known as: PRED FORTE Place 1 drop into the right eye daily. Start taking on: May 29, 2020 What changed: You were already taking a medication with the  same name, and this prescription was added. Make sure you understand how and when to take each.   Prolensa 0.07 % Soln Generic drug: Bromfenac Sodium Apply 1 drop to eye daily. Right eye   sertraline 50 MG tablet Commonly known as: ZOLOFT TAKE 1/2 TABLET BY MOUTH DAILY FOR 8 DAYS THEN INCREASE TO 1 TABLET A DAY What changed:   how much to take  how to take  this  when to take this  additional instructions       Follow-up Information    Ann Held, DO. Call in 1 week(s).   Specialty: Family Medicine Contact information: [address] [place] [address] [phone]        Nahser, Wonda Cheng, MD. Schedule an appointment as soon as possible for a visit in 2 month(s).   Specialty: Cardiology Contact information: [place] [address] [phone]        Prescott Gum, Collier Salina, MD. Schedule an appointment as soon as possible for a visit in 2 month(s).   Specialty: Cardiothoracic Surgery Contact information: [address] [place] [address] [phone]        Marchia Bond, MD. Schedule an appointment as soon as possible for a visit in 1 week(s).   Specialty: Orthopedic Surgery Contact information: [address] [place] [address] [phone]              Allergies  Allergen Reactions  . Iohexol Rash     Code: RASH, Desc: PATIENT STATES SHE IS ALLERGIC TO IV DYE. 20 YRS AGO SHE HAD A REACTION AT [facility], PT WAS GIVEN BENADRYL. 07/13/06/RM, Onset Date: [id number]   . Penicillins Hives    Consultations:  Orthopedics, Dr. Mardelle Matte   Procedures/Studies: DG Chest 2 View  Result Date: 05/13/2020 CLINICAL DATA:  79 year old female with left rib pain. EXAM: CHEST - 2 VIEW COMPARISON:  Chest radiograph dated 11/27/2019. FINDINGS: There is blunting of the left costophrenic angle with flattening of the left hemidiaphragm which may represent trace left pleural effusion and left lung base atelectasis. Pneumonia is not excluded clinical correlation is recommended. The right lung is clear. No pneumothorax. Mild cardiomegaly. There is widening of the upper mediastinum similar to prior radiograph. Atherosclerotic calcification of the aorta. No acute osseous pathology. IMPRESSION: Probable trace left pleural effusion and left lung  base atelectasis. Pneumonia is not excluded. Electronically Signed   By: Anner Crete M.D.   On: 05/13/2020 18:51   DG Clavicle Left  Result Date: 05/13/2020 CLINICAL DATA:  Fall, clavicular pain EXAM: LEFT CLAVICLE - 2+ VIEWS COMPARISON:  Chest radiograph 05/13/2020 FINDINGS: Fracture of the distal third left clavicle with slight inferior displacement of 1/3 shaft width. Minimal foreshortening across the fracture line. No other acute osseous abnormality is seen. Overlying soft tissue swelling. Acromioclavicular and glenohumeral alignment appears maintained. Background of moderate degenerative change at the acromioclavicular and glenohumeral joints. Mineralization seen about the inferior glenohumeral recess may reflect small joint body. IMPRESSION: 1. Fracture of the distal third left clavicle with slight inferior displacement of 1/3 shaft width. Associated swelling. 2. Moderate degenerative change of the acromioclavicular and glenohumeral joints. Electronically Signed   By: Lovena Le M.D.   On: 05/13/2020 19:36   CT Head Wo Contrast  Result Date: 05/13/2020 CLINICAL DATA:  79 year old female with head trauma. EXAM: CT HEAD WITHOUT CONTRAST CT CERVICAL SPINE WITHOUT CONTRAST TECHNIQUE: Multidetector CT imaging of the head  and cervical spine was performed following the standard protocol without intravenous contrast. Multiplanar CT image reconstructions of the cervical spine were also generated. COMPARISON:  CT dated 04/30/2020. FINDINGS: CT HEAD FINDINGS Brain: Mild age-related atrophy and chronic microvascular ischemic changes. There is no acute intracranial hemorrhage. No mass effect midline shift. No extra-axial fluid collection. Vascular: No hyperdense vessel or unexpected calcification. Skull: Normal. Negative for fracture or focal lesion. Sinuses/Orbits: No acute finding. Other: None CT CERVICAL SPINE FINDINGS Alignment: No acute subluxation. Skull base and vertebrae: No acute cervical spine  fracture. Osteopenia. Soft tissues and spinal canal: No prevertebral fluid or swelling. No visible canal hematoma. Disc levels: Multilevel degenerative changes with endplate irregularity and disc space narrowing. Multilevel facet arthropathy. Upper chest: Negative. Other: Partially visualized comminuted and displaced fracture of the left clavicle. IMPRESSION: 1. No acute intracranial pathology. Mild age-related atrophy and chronic microvascular ischemic changes. 2. No acute/traumatic cervical spine pathology. Multilevel degenerative changes. 3. Partially visualized comminuted and displaced fracture of the left clavicle. Electronically Signed   By: Anner Crete M.D.   On: 05/13/2020 19:38   CT Head Wo Contrast  Result Date: 04/30/2020 CLINICAL DATA:  Head trauma, minor. Neck trauma. Additional history provided: Fall. EXAM: CT HEAD WITHOUT CONTRAST CT CERVICAL SPINE WITHOUT CONTRAST TECHNIQUE: Multidetector CT imaging of the head and cervical spine was performed following the standard protocol without intravenous contrast. Multiplanar CT image reconstructions of the cervical spine were also generated. COMPARISON:  Head CT 11/25/2019. Radiographs of the cervical spine 07/16/2019. CT of the cervical spine 04/28/2018. FINDINGS: CT HEAD FINDINGS Brain: Generalized cerebral atrophy. Ill-defined hypoattenuation within the cerebral white matter which is nonspecific, but consistent with chronic small vessel ischemic disease. These findings are unchanged as compared to the head CT of 11/25/2019. There is no acute intracranial hemorrhage. No demarcated cortical infarct. No extra-axial fluid collection. No evidence of intracranial mass. No midline shift. Vascular: No hyperdense vessel.  Atherosclerotic calcifications. Skull: Normal. Negative for fracture or focal lesion. Sinuses/Orbits: Visualized orbits show no acute finding. Frontal scalp/forehead soft tissue swelling/hematoma. Minimal ethmoid sinus mucosal thickening  at the imaged levels. No significant mastoid effusion. CT CERVICAL SPINE FINDINGS Alignment: Straightening of the expected cervical lordosis. No significant spondylolisthesis. Skull base and vertebrae: The basion-dental and atlanto-dental intervals are maintained.No evidence of acute fracture to the cervical spine. Redemonstrated mild chronic T2 vertebral body compression fracture and mild chronic T3 superior endplate deformity. Soft tissues and spinal canal: No prevertebral fluid or swelling. No visible canal hematoma. Disc levels: Cervical spondylosis with multilevel disc space narrowing, disc bulges, posterior disc osteophytes, uncovertebral hypertrophy and facet arthrosis. Disc space narrowing is severe at C4-C5 and C5-C6. No high-grade bony spinal canal stenosis. Multilevel bony neural foraminal narrowing greatest bilaterally at C4-C5 and C5-C6. There is fusion across the C2-C3 posterior disc space and bilateral facet joints. There is also fusion across the C4-C5 facet joints. Upper chest: No consolidation within the imaged lung apices. No visible pneumothorax. Other: 16 mm nodule within the right thyroid lobe. Non acute, nondisplaced fracture deformity of the posterior right fourth rib (series 7, image 90). IMPRESSION: CT head: 1. No evidence of acute intracranial abnormality. 2. Frontal scalp and forehead soft tissue swelling/hematoma. 3. Generalized cerebral atrophy and chronic small vessel ischemic disease, stable as compared to the head CT of 11/25/2019. 4. Minimal ethmoid sinus mucosal thickening. CT cervical spine: 1. No evidence of acute fracture to the cervical spine. 2. Cervical spondylosis as described. 3. Redemonstrated mild chronic T2 vertebral body  compression fracture and mild chronic T3 superior endplate deformity. 4. Non-acute, nondisplaced fracture of the posterior right fourth rib. 5. 16 mm right thyroid lobe nodule. Nonemergent thyroid ultrasound is recommended for further evaluation.  Electronically Signed   By: Kellie Simmering DO   On: 04/30/2020 14:46   CT Cervical Spine Wo Contrast  Result Date: 05/13/2020 CLINICAL DATA:  79 year old female with head trauma. EXAM: CT HEAD WITHOUT CONTRAST CT CERVICAL SPINE WITHOUT CONTRAST TECHNIQUE: Multidetector CT imaging of the head and cervical spine was performed following the standard protocol without intravenous contrast. Multiplanar CT image reconstructions of the cervical spine were also generated. COMPARISON:  CT dated 04/30/2020. FINDINGS: CT HEAD FINDINGS Brain: Mild age-related atrophy and chronic microvascular ischemic changes. There is no acute intracranial hemorrhage. No mass effect midline shift. No extra-axial fluid collection. Vascular: No hyperdense vessel or unexpected calcification. Skull: Normal. Negative for fracture or focal lesion. Sinuses/Orbits: No acute finding. Other: None CT CERVICAL SPINE FINDINGS Alignment: No acute subluxation. Skull base and vertebrae: No acute cervical spine fracture. Osteopenia. Soft tissues and spinal canal: No prevertebral fluid or swelling. No visible canal hematoma. Disc levels: Multilevel degenerative changes with endplate irregularity and disc space narrowing. Multilevel facet arthropathy. Upper chest: Negative. Other: Partially visualized comminuted and displaced fracture of the left clavicle. IMPRESSION: 1. No acute intracranial pathology. Mild age-related atrophy and chronic microvascular ischemic changes. 2. No acute/traumatic cervical spine pathology. Multilevel degenerative changes. 3. Partially visualized comminuted and displaced fracture of the left clavicle. Electronically Signed   By: Anner Crete M.D.   On: 05/13/2020 19:38   CT Cervical Spine Wo Contrast  Result Date: 04/30/2020 CLINICAL DATA:  Head trauma, minor. Neck trauma. Additional history provided: Fall. EXAM: CT HEAD WITHOUT CONTRAST CT CERVICAL SPINE WITHOUT CONTRAST TECHNIQUE: Multidetector CT imaging of the head and  cervical spine was performed following the standard protocol without intravenous contrast. Multiplanar CT image reconstructions of the cervical spine were also generated. COMPARISON:  Head CT 11/25/2019. Radiographs of the cervical spine 07/16/2019. CT of the cervical spine 04/28/2018. FINDINGS: CT HEAD FINDINGS Brain: Generalized cerebral atrophy. Ill-defined hypoattenuation within the cerebral white matter which is nonspecific, but consistent with chronic small vessel ischemic disease. These findings are unchanged as compared to the head CT of 11/25/2019. There is no acute intracranial hemorrhage. No demarcated cortical infarct. No extra-axial fluid collection. No evidence of intracranial mass. No midline shift. Vascular: No hyperdense vessel.  Atherosclerotic calcifications. Skull: Normal. Negative for fracture or focal lesion. Sinuses/Orbits: Visualized orbits show no acute finding. Frontal scalp/forehead soft tissue swelling/hematoma. Minimal ethmoid sinus mucosal thickening at the imaged levels. No significant mastoid effusion. CT CERVICAL SPINE FINDINGS Alignment: Straightening of the expected cervical lordosis. No significant spondylolisthesis. Skull base and vertebrae: The basion-dental and atlanto-dental intervals are maintained.No evidence of acute fracture to the cervical spine. Redemonstrated mild chronic T2 vertebral body compression fracture and mild chronic T3 superior endplate deformity. Soft tissues and spinal canal: No prevertebral fluid or swelling. No visible canal hematoma. Disc levels: Cervical spondylosis with multilevel disc space narrowing, disc bulges, posterior disc osteophytes, uncovertebral hypertrophy and facet arthrosis. Disc space narrowing is severe at C4-C5 and C5-C6. No high-grade bony spinal canal stenosis. Multilevel bony neural foraminal narrowing greatest bilaterally at C4-C5 and C5-C6. There is fusion across the C2-C3 posterior disc space and bilateral facet joints. There is  also fusion across the C4-C5 facet joints. Upper chest: No consolidation within the imaged lung apices. No visible pneumothorax. Other: 16 mm nodule within the  right thyroid lobe. Non acute, nondisplaced fracture deformity of the posterior right fourth rib (series 7, image 90). IMPRESSION: CT head: 1. No evidence of acute intracranial abnormality. 2. Frontal scalp and forehead soft tissue swelling/hematoma. 3. Generalized cerebral atrophy and chronic small vessel ischemic disease, stable as compared to the head CT of 11/25/2019. 4. Minimal ethmoid sinus mucosal thickening. CT cervical spine: 1. No evidence of acute fracture to the cervical spine. 2. Cervical spondylosis as described. 3. Redemonstrated mild chronic T2 vertebral body compression fracture and mild chronic T3 superior endplate deformity. 4. Non-acute, nondisplaced fracture of the posterior right fourth rib. 5. 16 mm right thyroid lobe nodule. Nonemergent thyroid ultrasound is recommended for further evaluation. Electronically Signed   By: Kellie Simmering DO   On: 04/30/2020 14:46   DG Knee Complete 4 Views Right  Result Date: 05/13/2020 CLINICAL DATA:  Fall right-sided knee pain with bruising EXAM: RIGHT KNEE - COMPLETE 4+ VIEW COMPARISON:  08/24/2019 FINDINGS: No acute displaced fracture or malalignment. Moderate tricompartment arthritis of the knee. No significant effusion IMPRESSION: No acute osseous abnormality. Electronically Signed   By: Donavan Foil M.D.   On: 05/13/2020 18:52   DG Hand Complete Right  Result Date: 04/30/2020 CLINICAL DATA:  Fall, pain and swelling EXAM: RIGHT HAND - COMPLETE 3+ VIEW COMPARISON:  None. FINDINGS: Alignment is anatomic. No acute fracture. There are degenerative changes at the first carpometacarpal joint and to a lesser extent interphalangeal joints. IMPRESSION: No acute fracture. Electronically Signed   By: Macy Mis M.D.   On: 04/30/2020 14:34   ECHOCARDIOGRAM COMPLETE  Result Date: 05/14/2020     ECHOCARDIOGRAM REPORT   Patient Name:   Elizabeth Mccullough Date of Exam: 05/14/2020 Medical Rec #:  [id number]            Height:       62.0 in Accession #:    [id number]           Weight:       130.0 lb Date of Birth:  1941/01/16            BSA:          1.592 m Patient Age:    32 years             BP:           133/62 mmHg Patient Gender: F                    HR:           73 bpm. Exam Location:  Inpatient Procedure: 2D Echo Indications:    syncope 780.2  History:        Patient has prior history of Echocardiogram examinations, most                 recent 08/11/2017. Abdominal aortic aneurysm; Risk                 Factors:Dyslipidemia.  Sonographer:    Johny Chess Referring Phys: [id number] AMY N [facility] IMPRESSIONS  1. Left ventricular ejection fraction, by estimation, is 30 to 35%. The left ventricle has moderately decreased function. The left ventricle demonstrates regional wall motion abnormalities (see scoring diagram/findings for description). Left ventricular  diastolic parameters are consistent with Grade I diastolic dysfunction (impaired relaxation). There is moderate hypokinesis of the left ventricular, entire anteroseptal wall, inferoseptal wall and inferior wall. Hypokinesis of the septal/inferior walls,  preserved at apex.  2. Right ventricular systolic function  is normal. The right ventricular size is normal. There is normal pulmonary artery systolic pressure.  3. Left atrial size was severely dilated.  4. The mitral valve is normal in structure. Mild mitral valve regurgitation.  5. The aortic valve is tricuspid. There is mild calcification of the aortic valve. There is mild thickening of the aortic valve. Aortic valve regurgitation is moderate to severe. Mild to moderate aortic valve sclerosis/calcification is present, without any evidence of aortic stenosis. Comparison(s): Prior images reviewed side by side. Changes from prior study are noted. Conclusion(s)/Recommendation(s): I reviewed 2019 study  side by side. On my read, there was mild-moderate LVEF reduction with mild hypokinesis of the septal and inferior walls in 2019. These abnormalities are now more prominent/severe on current study. Aortic regurgitation moderate-severe. Findings communicated with attending Dr. [place] at 1:44 PM via secure chat. FINDINGS  Left Ventricle: Left ventricular ejection fraction, by estimation, is 30 to 35%. The left ventricle has moderately decreased function. The left ventricle demonstrates regional wall motion abnormalities. Moderate hypokinesis of the left ventricular, entire anteroseptal wall, inferoseptal wall and inferior wall. The left ventricular internal cavity size was normal in size. There is no left ventricular hypertrophy. Abnormal (paradoxical) septal motion, consistent with left bundle branch block. Left ventricular diastolic parameters are consistent with Grade I diastolic dysfunction (impaired relaxation).  LV Wall Scoring: Hypokinesis of the septal/inferior walls, preserved at apex. Right Ventricle: The right ventricular size is normal. Right vetricular wall thickness was not well visualized. Right ventricular systolic function is normal. There is normal pulmonary artery systolic pressure. The tricuspid regurgitant velocity is 2.79 m/s, and with an assumed right atrial pressure of 3 mmHg, the estimated right ventricular systolic pressure is 62.8 mmHg. Left Atrium: Left atrial size was severely dilated. Right Atrium: Right atrial size was normal in size. Pericardium: There is no evidence of pericardial effusion. Mitral Valve: The mitral valve is normal in structure. Mild mitral valve regurgitation. Tricuspid Valve: The tricuspid valve is normal in structure. Tricuspid valve regurgitation is mild . No evidence of tricuspid stenosis. Aortic Valve: The aortic valve is tricuspid. There is mild calcification of the aortic valve. There is mild thickening of the aortic valve. Aortic valve regurgitation is moderate  to severe. Aortic regurgitation PHT measures 266 msec. Mild to moderate aortic valve sclerosis/calcification is present, without any evidence of aortic stenosis. Pulmonic Valve: The pulmonic valve was not well visualized. Pulmonic valve regurgitation is trivial. No evidence of pulmonic stenosis. Aorta: The aortic root, ascending aorta, aortic arch and descending aorta are all structurally normal, with no evidence of dilitation or obstruction. IAS/Shunts: The atrial septum is grossly normal.  LEFT VENTRICLE PLAX 2D LVIDd:         4.90 cm      Diastology LVIDs:         3.50 cm      LV e' medial:    4.79 cm/s LV PW:         1.00 cm      LV E/e' medial:  15.1 LV IVS:        1.00 cm      LV e' lateral:   5.66 cm/s LVOT diam:     1.80 cm      LV E/e' lateral: 12.8 LV SV:         51 LV SV Index:   32 LVOT Area:     2.54 cm  LV Volumes (MOD) LV vol d, MOD A2C: 120.0 ml LV vol d, MOD A4C:  104.0 ml LV vol s, MOD A2C: 70.7 ml LV vol s, MOD A4C: 79.7 ml LV SV MOD A2C:     49.3 ml LV SV MOD A4C:     104.0 ml LV SV MOD BP:      38.3 ml RIGHT VENTRICLE             IVC RV S prime:     14.00 cm/s  IVC diam: 1.10 cm TAPSE (M-mode): 2.3 cm LEFT ATRIUM             Index       RIGHT ATRIUM           Index LA diam:        2.90 cm 1.82 cm/m  RA Area:     14.60 cm LA Vol (A2C):   80.8 ml 50.76 ml/m RA Volume:   34.20 ml  21.48 ml/m LA Vol (A4C):   84.0 ml 52.77 ml/m LA Biplane Vol: 86.3 ml 54.21 ml/m  AORTIC VALVE LVOT Vmax:   115.00 cm/s LVOT Vmean:  72.500 cm/s LVOT VTI:    0.199 m AI PHT:      266 msec  AORTA Ao Root diam: 2.90 cm MITRAL VALVE                TRICUSPID VALVE MV Area (PHT): 3.42 cm     TR Peak grad:   31.1 mmHg MV Decel Time: 222 msec     TR Vmax:        279.00 cm/s MV E velocity: 72.40 cm/s MV A velocity: 115.00 cm/s  SHUNTS MV E/A ratio:  0.63         Systemic VTI:  0.20 m                             Systemic Diam: 1.80 cm Buford Dresser MD Electronically signed by Buford Dresser MD Signature  Date/Time: 05/14/2020/1:44:42 PM    Final    DG Hip Unilat W or Wo Pelvis 1 View Right  Result Date: 05/13/2020 CLINICAL DATA:  79 year old female with right hip pain. Fall. EXAM: DG HIP (WITH OR WITHOUT PELVIS) 1V RIGHT COMPARISON:  Right hip radiograph dated 04/30/2020. FINDINGS: There is no acute fracture or dislocation. The bones are osteopenic. Moderate bilateral hip osteoarthritic changes. There is degenerative changes of the lower lumbar spine. The soft tissues are unremarkable. IMPRESSION: Negative. Electronically Signed   By: Anner Crete M.D.   On: 05/13/2020 18:59   DG Hip Unilat W or Wo Pelvis 2-3 Views Right  Result Date: 04/30/2020 CLINICAL DATA:  Fall EXAM: DG HIP (WITH OR WITHOUT PELVIS) 2-3V RIGHT COMPARISON:  None. FINDINGS: There is alignment at the right hip. No acute fracture. Changes of osteoarthritis. IMPRESSION: No acute fracture. Electronically Signed   By: Macy Mis M.D.   On: 04/30/2020 14:40     Subjective: Patient seen and examined bedside, resting comfortably.  Complains of mild discomfort to left clavicle region.  Otherwise no other concerns at this time.  Denies headache, no chest pain, palpitations, no shortness of breath, no abdominal pain.  Ready for discharge to CIR today.  No acute events overnight per nursing staff.  Discharge Exam: Vitals:   05/17/20 0300 05/17/20 0758  BP: 130/78 (!) 155/67  Pulse: 67 71  Resp: 16 16  Temp: 97.7 F (36.5 C) 98.6 F (37 C)  SpO2: 96% 97%   Vitals:   05/16/20 1940 05/17/20 0300  05/17/20 0500 05/17/20 0758  BP: 139/72 130/78  (!) 155/67  Pulse: 74 67  71  Resp: 17 16  16   Temp: 98.5 F (36.9 C) 97.7 F (36.5 C)  98.6 F (37 C)  TempSrc: Oral Oral  Oral  SpO2: 98% 96%  97%  Weight:   62.7 kg   Height:        General: Pt is alert, awake, not in acute distress Cardiovascular: RRR, S1/S2 +, no rubs, no gallops Respiratory: CTA bilaterally, no wheezing, no rhonchi Abdominal: Soft, NT, ND, bowel  sounds + Extremities: no edema, no cyanosis, mild tenderness to palpation left shoulder/clavicle region with ecchymosis    The results of significant diagnostics from this hospitalization (including imaging, microbiology, ancillary and laboratory) are listed below for reference.     Microbiology: Recent Results (from the past 240 hour(s))  Respiratory Panel by RT PCR (Flu A&B, Covid) - Nasopharyngeal Swab     Status: None   Collection Time: 05/13/20  9:38 PM   Specimen: Nasopharyngeal Swab  Result Value Ref Range Status   SARS Coronavirus 2 by RT PCR NEGATIVE NEGATIVE Final    Comment: (NOTE) SARS-CoV-2 target nucleic acids are NOT DETECTED.  The SARS-CoV-2 RNA is generally detectable in upper respiratoy specimens during the acute phase of infection. The lowest concentration of SARS-CoV-2 viral copies this assay can detect is 131 copies/mL. A negative result does not preclude SARS-Cov-2 infection and should not be used as the sole basis for treatment or other patient management decisions. A negative result may occur with  improper specimen collection/handling, submission of specimen other than nasopharyngeal swab, presence of viral mutation(s) within the areas targeted by this assay, and inadequate number of viral copies (<131 copies/mL). A negative result must be combined with clinical observations, patient history, and epidemiological information. The expected result is Negative.  Fact Sheet for Patients:  PinkCheek.be  Fact Sheet for Healthcare Providers:  GravelBags.it  This test is no t yet approved or cleared by the [place] FDA and  has been authorized for detection and/or diagnosis of SARS-CoV-2 by FDA under an Emergency Use Authorization (EUA). This EUA will remain  in effect (meaning this test can be used) for the duration of the COVID-19 declaration under Section 564(b)(1) of the Act, 21 U.S.C. section  360bbb-3(b)(1), unless the authorization is terminated or revoked sooner.     Influenza A by PCR NEGATIVE NEGATIVE Final   Influenza B by PCR NEGATIVE NEGATIVE Final    Comment: (NOTE) The Xpert Xpress SARS-CoV-2/FLU/RSV assay is intended as an aid in  the diagnosis of influenza from Nasopharyngeal swab specimens and  should not be used as a sole basis for treatment. Nasal washings and  aspirates are unacceptable for Xpert Xpress SARS-CoV-2/FLU/RSV  testing.  Fact Sheet for Patients: PinkCheek.be  Fact Sheet for Healthcare Providers: GravelBags.it  This test is not yet approved or cleared by the [place] FDA and  has been authorized for detection and/or diagnosis of SARS-CoV-2 by  FDA under an Emergency Use Authorization (EUA). This EUA will remain  in effect (meaning this test can be used) for the duration of the  Covid-19 declaration under Section 564(b)(1) of the Act, 21  U.S.C. section 360bbb-3(b)(1), unless the authorization is  terminated or revoked. Performed at [facility], [place] [address], [place], [place] [address]      Labs: BNP (last 3 results) No results for input(s): BNP in the last 8760 hours. Basic Metabolic Panel: Recent Labs  Lab 05/13/20 1854 05/14/20 0316 05/15/20 0348 05/16/20 0457  NA 142 141 141 140  K 3.5 3.1* 4.3 3.9  CL 105 104 105 104  CO2 26 30 26 27   GLUCOSE 100* 90 92 101*  BUN 21 17 19 16   CREATININE 0.88 0.80 0.80 0.82  CALCIUM 9.0 8.8* 8.6* 9.0  MG  --   --  1.9 1.9   Liver Function Tests: Recent Labs  Lab 05/13/20 1854  AST 24  ALT 13  ALKPHOS 62  BILITOT 0.8  PROT 6.4*  ALBUMIN 3.8   No results for input(s): LIPASE, AMYLASE in the last 168 hours. No results for input(s): AMMONIA in the last 168 hours. CBC: Recent Labs  Lab 05/13/20 1854 05/14/20 0316  WBC 7.9 6.4  NEUTROABS 5.5  --   HGB 10.6* 9.9*  HCT 33.6* 30.1*  MCV 99.4 98.0  PLT 329 294    Cardiac Enzymes: No results for input(s): CKTOTAL, CKMB, CKMBINDEX, TROPONINI in the last 168 hours. BNP: Invalid input(s): POCBNP CBG: No results for input(s): GLUCAP in the last 168 hours. D-Dimer No results for input(s): DDIMER in the last 72 hours. Hgb A1c No results for input(s): HGBA1C in the last 72 hours. Lipid Profile No results for input(s): CHOL, HDL, LDLCALC, TRIG, CHOLHDL, LDLDIRECT in the last 72 hours. Thyroid function studies No results for input(s): TSH, T4TOTAL, T3FREE, THYROIDAB in the last 72 hours.  Invalid input(s): FREET3 Anemia work up No results for input(s): VITAMINB12, FOLATE, FERRITIN, TIBC, IRON, RETICCTPCT in the last 72 hours. Urinalysis    Component Value Date/Time   COLORURINE YELLOW 05/13/2020 2102   APPEARANCEUR CLEAR 05/13/2020 2102   LABSPEC 1.009 05/13/2020 2102   PHURINE 6.0 05/13/2020 2102   GLUCOSEU NEGATIVE 05/13/2020 2102   HGBUR NEGATIVE 05/13/2020 2102   HGBUR negative 12/05/2009 0000   BILIRUBINUR NEGATIVE 05/13/2020 2102   BILIRUBINUR negative 12/18/2019 1154   KETONESUR NEGATIVE 05/13/2020 2102   PROTEINUR NEGATIVE 05/13/2020 2102   UROBILINOGEN 1.0 12/18/2019 1154   UROBILINOGEN 0.2 02/27/2012 1857   NITRITE NEGATIVE 05/13/2020 2102   LEUKOCYTESUR TRACE (A) 05/13/2020 2102   Sepsis Labs Invalid input(s): PROCALCITONIN,  WBC,  LACTICIDVEN Microbiology Recent Results (from the past 240 hour(s))  Respiratory Panel by RT PCR (Flu A&B, Covid) - Nasopharyngeal Swab     Status: None   Collection Time: 05/13/20  9:38 PM   Specimen: Nasopharyngeal Swab  Result Value Ref Range Status   SARS Coronavirus 2 by RT PCR NEGATIVE NEGATIVE Final    Comment: (NOTE) SARS-CoV-2 target nucleic acids are NOT DETECTED.  The SARS-CoV-2 RNA is generally detectable in upper respiratoy specimens during the acute phase of infection. The lowest concentration of SARS-CoV-2 viral copies this assay can detect is 131 copies/mL. A negative result  does not preclude SARS-Cov-2 infection and should not be used as the sole basis for treatment or other patient management decisions. A negative result may occur with  improper specimen collection/handling, submission of specimen other than nasopharyngeal swab, presence of viral mutation(s) within the areas targeted by this assay, and inadequate number of viral copies (<131 copies/mL). A negative result must be combined with clinical observations, patient history, and epidemiological information. The expected result is Negative.  Fact Sheet for Patients:  PinkCheek.be  Fact Sheet for Healthcare Providers:  GravelBags.it  This test is no t yet approved or cleared by the [place] FDA and  has been authorized for detection and/or diagnosis of SARS-CoV-2 by FDA under an Emergency Use  Authorization (EUA). This EUA will remain  in effect (meaning this test can be used) for the duration of the COVID-19 declaration under Section 564(b)(1) of the Act, 21 U.S.C. section 360bbb-3(b)(1), unless the authorization is terminated or revoked sooner.     Influenza A by PCR NEGATIVE NEGATIVE Final   Influenza B by PCR NEGATIVE NEGATIVE Final    Comment: (NOTE) The Xpert Xpress SARS-CoV-2/FLU/RSV assay is intended as an aid in  the diagnosis of influenza from Nasopharyngeal swab specimens and  should not be used as a sole basis for treatment. Nasal washings and  aspirates are unacceptable for Xpert Xpress SARS-CoV-2/FLU/RSV  testing.  Fact Sheet for Patients: PinkCheek.be  Fact Sheet for Healthcare Providers: GravelBags.it  This test is not yet approved or cleared by the [place] FDA and  has been authorized for detection and/or diagnosis of SARS-CoV-2 by  FDA under an Emergency Use Authorization (EUA). This EUA will remain  in effect (meaning this test can be used) for  the duration of the  Covid-19 declaration under Section 564(b)(1) of the Act, 21  U.S.C. section 360bbb-3(b)(1), unless the authorization is  terminated or revoked. Performed at [facility], [place] [address], [place], [place] [address]      Time coordinating discharge: Over 30 minutes  SIGNED:   Quanda Pavlicek J [place] DO  [facility] 05/17/2020, 11:17 AM

## 2020-05-17 NOTE — Progress Notes (Signed)
Patient presented to unit from 5N via bed with nurse and nurse tech. Patient somewhat confused but easily redirected. Patient adjusting to unit.  Sanda Linger, LPN

## 2020-05-17 NOTE — Plan of Care (Signed)
Patient is not c/o of any pain or discomfort to left shoulder at this time. Per fall safety precautions, bed alarm is on and fall risk bracelet in place. Educated patient on using the call bell if in need of assistance. Patient is expected to be discharged to [facility] tomorrow 10/30. Will continue to monitor and continue current POC.

## 2020-05-18 ENCOUNTER — Inpatient Hospital Stay (HOSPITAL_COMMUNITY): Payer: Medicare Other | Admitting: Physical Therapy

## 2020-05-18 ENCOUNTER — Inpatient Hospital Stay (HOSPITAL_COMMUNITY): Payer: Medicare Other | Admitting: Occupational Therapy

## 2020-05-18 DIAGNOSIS — I5042 Chronic combined systolic (congestive) and diastolic (congestive) heart failure: Secondary | ICD-10-CM

## 2020-05-18 DIAGNOSIS — K5901 Slow transit constipation: Secondary | ICD-10-CM

## 2020-05-18 DIAGNOSIS — S42002S Fracture of unspecified part of left clavicle, sequela: Secondary | ICD-10-CM

## 2020-05-18 MED ORDER — SORBITOL 70 % SOLN
30.0000 mL | Freq: Every day | Status: DC | PRN
  Administered 2020-05-18 – 2020-05-29 (×2): 30 mL via ORAL
  Filled 2020-05-18 (×2): qty 30

## 2020-05-18 MED ORDER — GATIFLOXACIN 0.5 % OP SOLN: 1.0000 [drp] | Freq: Four times a day (QID) | OPHTHALMIC | Status: DC

## 2020-05-18 MED ORDER — MOXIFLOXACIN HCL 0.5 % OP SOLN
1.0000 [drp] | Freq: Three times a day (TID) | OPHTHALMIC | Status: DC
  Administered 2020-05-18 (×2): 1 [drp] via OPHTHALMIC

## 2020-05-18 MED ORDER — FLEET ENEMA 7-19 GM/118ML RE ENEM: 1.0000 | ENEMA | Freq: Every day | RECTAL | Status: DC | PRN

## 2020-05-18 MED ORDER — BROMFENAC SODIUM 0.07 % OP SOLN
1.0000 [drp] | Freq: Every day | OPHTHALMIC | Status: DC
  Administered 2020-05-18 – 2020-05-31 (×14): 1 [drp] via OPHTHALMIC

## 2020-05-18 MED ORDER — BISACODYL 10 MG RE SUPP
10.0000 mg | Freq: Every day | RECTAL | Status: DC | PRN
  Administered 2020-05-18: 10 mg via RECTAL
  Filled 2020-05-18: qty 1

## 2020-05-18 NOTE — Plan of Care (Signed)
  Problem: Sit to Stand Goal: LTG:  Patient will perform sit to stand in prep for activites of daily living with assistance level (OT) Description: LTG:  Patient will perform sit to stand in prep for activites of daily living with assistance level (OT) Flowsheets (Taken 05/18/2020 1633) LTG: PT will perform sit to stand in prep for activites of daily living with assistance level: Supervision/Verbal cueing   Problem: RH Bathing Goal: LTG Patient will bathe all body parts with assist levels (OT) Description: LTG: Patient will bathe all body parts with assist levels (OT) Flowsheets (Taken 05/18/2020 1633) LTG: Pt will perform bathing with assistance level/cueing: Supervision/Verbal cueing   Problem: RH Dressing Goal: LTG Patient will perform upper body dressing (OT) Description: LTG Patient will perform upper body dressing with assist, with/without cues (OT). Flowsheets (Taken 05/18/2020 1633) LTG: Pt will perform upper body dressing with assistance level of: (excluding arm sling)  Supervision/Verbal cueing  Other (Comment) Goal: LTG Patient will perform lower body dressing w/assist (OT) Description: LTG: Patient will perform lower body dressing with assist, with/without cues in positioning using equipment (OT) Flowsheets (Taken 05/18/2020 1633) LTG: Pt will perform lower body dressing with assistance level of: (excluding Teds)  Supervision/Verbal cueing  Other (Comment)   Problem: RH Toileting Goal: LTG Patient will perform toileting task (3/3 steps) with assistance level (OT) Description: LTG: Patient will perform toileting task (3/3 steps) with assistance level (OT)  Flowsheets (Taken 05/18/2020 1633) LTG: Pt will perform toileting task (3/3 steps) with assistance level: Supervision/Verbal cueing   Problem: RH Toilet Transfers Goal: LTG Patient will perform toilet transfers w/assist (OT) Description: LTG: Patient will perform toilet transfers with assist, with/without cues using  equipment (OT) Flowsheets (Taken 05/18/2020 1633) LTG: Pt will perform toilet transfers with assistance level of: Supervision/Verbal cueing   Problem: RH Tub/Shower Transfers Goal: LTG Patient will perform tub/shower transfers w/assist (OT) Description: LTG: Patient will perform tub/shower transfers with assist, with/without cues using equipment (OT) Flowsheets (Taken 05/18/2020 1633) LTG: Pt will perform tub/shower stall transfers with assistance level of: Contact Guard/Touching assist

## 2020-05-18 NOTE — Progress Notes (Signed)
Inpatient Rehabilitation Medication Review by a Pharmacist   A complete drug regimen review was completed for this patient to identify any potential clinically significant medication issues.   Clinically significant medication issues were identified:  possible     Type of Medication Issue Identified Description of Issue Plan    Drug Interaction(s) (clinically significant)          Duplicate Therapy          Allergy          No Medication Administration End Date          Incorrect Dose          Additional Drug Therapy Needed          Other   Bromfenac and moxifloxacin Eye drops not reordered  F/u with team in morning       Pharmacist comments: Spoke with Dr. Naaman Plummer, restarted bromfenac and moxifloxacin eye drops. Patient supplied. Confirmed with RN that both drops are still at bedside.    Time spent performing this drug regimen review (minutes):  [place], PharmD PGY1 Pharmacy Resident 05/18/2020 10:33 AM  Please check [email or web address] for unit-specific pharmacy phone numbers.

## 2020-05-18 NOTE — H&P (Signed)
Physical Medicine and Rehabilitation Admission H&P        Chief Complaint  Patient presents with  . 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  Per chart review patient resides independent living facility [facility] since March 2021.  Ambulates with a rolling walker but does endorse frequent falls.  Elizabeth Mccullough was able to bathe dress feet and told herself and goes to the dining hall for meals..  Daughter and family plan to hire assistance as needed.  Presented 05/13/2020 after falling 3 times on day of presentation.  Denied loss of consciousness.  Admission chemistries glucose 100, troponin 26>29, hemoglobin 10.6, urinalysis negative nitrite.  Cranial CT scan as well as CT cervical spine showed no acute intracranial pathology and no acute traumatic cervical spine pathology.  X-rays right knee no acute osseous abnormality.  Findings of comminuted displaced left clavicle fracture.  Follow-up orthopedic services Dr. Mardelle Matte in regards to left clavicle fracture also right knee contusion with conservative care and no surgical intervention.  Nonweightbearing left upper extremity with shoulder sling.  Cardiology service follow-up for history of CHF with latest chest x-ray showing probable left pleural effusion and left lung base atelectasis and echocardiogram completed showing ejection fraction 30 to 09% grade 1 diastolic dysfunction, moderate hypokinesis LV...  Patient currently remains on Lasix 40 mg daily as well as lisinopril with Zebeta 2.5 mg daily.  Subcutaneous heparin added for DVT prophylaxis.  Therapy evaluations completed and patient was admitted for a comprehensive rehab  program.   Review of Systems  Constitutional: Negative for chills and fever.  HENT: Negative for hearing loss.   Eyes: Negative for blurred vision and double vision.  Respiratory: Positive for shortness of breath. Negative for cough.   Cardiovascular: Positive for palpitations and leg swelling. Negative for chest pain.  Gastrointestinal: Positive for constipation. Negative for heartburn and nausea.  Genitourinary: Negative for dysuria, flank pain and hematuria.  Musculoskeletal: Positive for falls and myalgias.  Skin: Negative for rash.  Neurological: Positive for dizziness and tremors.  Psychiatric/Behavioral: The patient has insomnia.        Anxiety  All other systems reviewed and are negative.       Past Medical History:  Diagnosis Date  . AAA (abdominal aortic aneurysm) ([place])    . Carotid artery disease ([place]) 01/12/2017    Carotid US 6/18: Bilat < 50%; repeat in 12/2000  . Chronic combined systolic and diastolic CHF (congestive heart failure) ([place]) 03/01/2017    NICM // [place] 8/18: normal coronary arteries /  Echo 8/18: EF 20-25, mod AI, mod MR, PASP 32 // Echo 1/19: EF 55-60, no RWMA, Gr 1 DD, mod AI, mild MR, PASP 32  . Hyperlipidemia    . Parkinson's disease [facility])           Past Surgical History:  Procedure Laterality Date  . BREAST CYST ASPIRATION   1965    Right Breast  . HAMMER TOE SURGERY   04/10/02    Left Toe  . KNEE SURGERY Right 10/17/15    meniscus repair  . NASAL SEPTUM SURGERY   1980  . RIGHT/LEFT HEART CATH AND CORONARY ANGIOGRAPHY N/A 03/03/2017    Procedure: RIGHT/LEFT HEART CATH AND CORONARY ANGIOGRAPHY;  Surgeon: Nelva Bush,  MD;  Location: [facility];  Service: Cardiovascular;  Laterality: N/A;  . THORACIC AORTOGRAM N/A 03/03/2017    Procedure: Thoracic Aortogram;  Surgeon: Nelva Bush, MD;  Location: [facility];  Service: Cardiovascular;  Laterality: N/A;         Family History  Problem Relation Age of Onset  . Heart disease  Mother    . Hyperlipidemia Sister    . Hyperlipidemia Brother    . Diabetes Sister    . Hyperlipidemia Sister    . Cancer Sister 87        colon  . Cancer Sister          breast  . Stroke Brother    . Hypertension Brother    . Breast cancer Other    . Colon cancer Other    . Prostate cancer Son    . Diabetes Daughter    . Stomach cancer Daughter    . Healthy Daughter      Social History:  reports that Elizabeth Mccullough has never smoked. Elizabeth Mccullough has never used smokeless tobacco. Elizabeth Mccullough reports current alcohol use. Elizabeth Mccullough reports that Elizabeth Mccullough does not use drugs. Allergies:       Allergies  Allergen Reactions  . Iohexol Rash       Code: RASH, Desc: PATIENT STATES Elizabeth Mccullough IS ALLERGIC TO IV DYE. 20 YRS AGO Elizabeth Mccullough HAD A REACTION AT [facility], PT WAS GIVEN BENADRYL. 07/13/06/RM, Onset Date: [id number]    . Penicillins Hives    Medications Prior to Admission  Medication Sig Dispense Refill  . Acetaminophen 325 MG CAPS Take 1-2 capsules by mouth every 8 (eight) hours as needed (moderate to severe pain). 30 capsule 0  . aspirin EC 81 MG tablet Take 1 tablet (81 mg total) by mouth daily.      . bisoprolol (ZEBETA) 5 MG tablet Take 0.5 tablets (2.5 mg total) by mouth daily. 45 tablet 3  . Bromfenac Sodium (PROLENSA) 0.07 % SOLN Apply 1 drop to eye daily. Right eye      . Carbidopa-Levodopa ER (SINEMET CR) 25-100 MG tablet controlled release TAKE 2 TABLETS BY MOUTH 3 TIMES A DAY (Patient taking differently: Take 2 tablets by mouth in the morning, at noon, and at bedtime. TAKE 2 TABLETS BY MOUTH 3 TIMES A DAY) 540 tablet 0  . furosemide (LASIX) 20 MG tablet Take 1 tablet (20 mg total) by mouth daily. 90 tablet 3  . ibuprofen (ADVIL) 200 MG tablet Take 400 mg by mouth every 6 (six) hours as needed for moderate pain.      Marland Kitchen lovastatin (MEVACOR) 20 MG tablet TAKE 1 TABLET BY MOUTH AT BEDTIME. (Patient taking differently: Take 20 mg by mouth at bedtime. ) 90 tablet 0  . moxifloxacin (VIGAMOX) 0.5 % ophthalmic solution Place 1  drop into the right eye in the morning, at noon, in the evening, and at bedtime.      . potassium chloride (KLOR-CON) 10 MEQ tablet Take 1 tablet (10 mEq total) by mouth daily. 90 tablet 3  . prednisoLONE acetate (PRED FORTE) 1 % ophthalmic suspension Place 1 drop into the right eye 4 (four) times daily.      . sertraline (ZOLOFT) 50 MG tablet TAKE 1/2 TABLET BY MOUTH DAILY FOR 8 DAYS THEN INCREASE TO 1 TABLET A DAY (Patient taking differently: Take 50 mg by mouth daily. ) 30 tablet 2      Drug Regimen Review Drug regimen was reviewed and remains appropriate with no  significant issues identified.   Home: Home Living Family/patient expects to be discharged to:: Assisted living (Independent living) Living Arrangements: Alone, Other (Comment) (ILF at [facility] since March 2021. Has onsite emergen) Available Help at Discharge: Other (Comment) (daughter states family will hire assistance to be provided a) Type of Home: Independent living facility Home Access: Level entry Home Layout: One level Bathroom Shower/Tub: Multimedia programmer: Handicapped height Bathroom Accessibility: Yes Home Equipment: Environmental consultant - 4 wheels, Grab bars - toilet, Grab bars - tub/shower, Shower seat, Bedside commode Additional Comments: pt has staff from rehab observing her self care to give her feedback  Lives With: Alone, Other (Comment) ([place])   Functional History: Prior Function Level of Independence: Needs assistance Gait / Transfers Assistance Needed: Able to ambulate with Rollator ADL's / Homemaking Assistance Needed: Pt able to bathe, dress, feed and toilet self. Assistance with laundry, goes to dining hall for meals Comments: Endorses frequent falls   Functional Status:  Mobility: Bed Mobility Overal bed mobility: Needs Assistance Bed Mobility: Supine to Sit, Sit to Supine Supine to sit: Mod assist, HOB elevated Sit to supine: Max assist General bed mobility comments:  Mod A to scoot hips forward, improved sequencing in reaching for bedrail with unaffected UE. Max A to return to bed, difficulty initiating and required assist for B LE Transfers Overall transfer level: Needs assistance Equipment used: Rolling walker (2 wheeled) Transfers: Sit to/from Stand Sit to Stand: Min assist Stand pivot transfers: Min assist General transfer comment: Min A for sit to stand in power up, noted not much weight put through L UE on walker. Min A for manuvering DME during side steps at bedside Ambulation/Gait Ambulation/Gait assistance: Min assist Gait Distance (Feet): 30 Feet (15 x 2) Assistive device: IV Pole, 1 person hand held assist Gait Pattern/deviations: Decreased stride length, Step-to pattern, Step-through pattern, Wide base of support, Staggering left, Staggering right General Gait Details: pt is listing in mult directions, esp not safe to turn corners and steady to pivot and sit Gait velocity: reduced Gait velocity interpretation: <1.8 ft/sec, indicate of risk for recurrent falls   ADL: ADL Overall ADL's : Needs assistance/impaired Eating/Feeding: Set up, Sitting Grooming: Moderate assistance, Sitting, Oral care Grooming Details (indicate cue type and reason): Mod A overall for brushing teeth sitting EOB. Great difficulty problem solving, required max cues for sequencing. Use of sling helped deter pt from using L UE Upper Body Bathing: Moderate assistance, Sitting, Cueing for UE precautions Lower Body Bathing: Maximal assistance, Sit to/from stand Upper Body Dressing : Moderate assistance, Cueing for UE precautions, Sitting Upper Body Dressing Details (indicate cue type and reason): Mod A for sling mgmt  Lower Body Dressing: Maximal assistance, Sit to/from stand, Sitting/lateral leans Toilet Transfer: Minimal assistance, +2 for safety/equipment, +2 for physical assistance, Ambulation, BSC, Regular Toilet Toilet Transfer Details (indicate cue type and reason):  Min A x 2 to maintain balance, shuffling gait noted. Attempted use of IV pole, 1 and 2 person handheld assist, cueing to avoid L UE use Toileting- Clothing Manipulation and Hygiene: Moderate assistance, Sitting/lateral lean, Sit to/from stand Toileting - Clothing Manipulation Details (indicate cue type and reason): Mod A overall. pt able to get toilet paper, assist with clothing mgmt, but difficulty attending to L UE precaution Functional mobility during ADLs: Minimal assistance, +2 for physical assistance, +2 for safety/equipment, Rolling walker, Cueing for sequencing, Cueing for safety General ADL Comments: Pt with improved balance with RW (cleared by MD), noted with deficits in  problem solving ADLs today   Cognition: Cognition Overall Cognitive Status: History of cognitive impairments - at baseline Orientation Level: Oriented to person, Oriented to place, Disoriented to time Cognition Arousal/Alertness: Awake/alert Behavior During Therapy: Flat affect Overall Cognitive Status: History of cognitive impairments - at baseline Area of Impairment: Orientation, Attention, Memory, Following commands, Safety/judgement, Awareness, Problem solving Orientation Level: Place Current Attention Level: Sustained Memory: Decreased recall of precautions, Decreased short-term memory Following Commands: Follows one step commands with increased time Safety/Judgement: Decreased awareness of safety, Decreased awareness of deficits Awareness: Intellectual Problem Solving: Slow processing, Decreased initiation, Difficulty sequencing, Requires verbal cues, Requires tactile cues General Comments: Pt with difficulty problem solving multi step ADL tasks today,  Difficult to assess due to: Level of arousal   Physical Exam: Blood pressure (!) 161/70, pulse 81, temperature 98.4 F (36.9 C), temperature source Oral, resp. rate 15, height 5\' 2"  (1.575 m), weight 59 kg, last menstrual period 07/19/1988, SpO2 94  %. Physical Exam Constitutional:      Appearance: Elizabeth Mccullough is ill-appearing.  HENT:     Head: Normocephalic and atraumatic.     Right Ear: External ear normal.     Left Ear: External ear normal.     Nose: Nose normal.     Mouth/Throat:     Mouth: Mucous membranes are moist.     Pharynx: Oropharynx is clear.  Eyes:     Conjunctiva/sclera: Conjunctivae normal.     Pupils: Pupils are equal, round, and reactive to light.  Cardiovascular:     Rate and Rhythm: Normal rate and regular rhythm.     Heart sounds: Murmur heard.   Pulmonary:     Effort: Pulmonary effort is normal. No respiratory distress.     Breath sounds: Normal breath sounds. No wheezing.  Abdominal:     General: Abdomen is flat. There is no distension.     Tenderness: There is no abdominal tenderness.  Musculoskeletal:        General: Tenderness present.     Cervical back: Normal range of motion.     Comments: Left shoulder painful with minimal movement. Large amount of bruising behind left scapula  Skin:    Comments: Scattered areas of bruising, large bruise behind left shoulder  Neurological:     Comments: Patient is alert in no acute distress.  Makes eye contact with examiner and follows commands. Could provide me biographical information with extra time. Overall with processing delays. Speech very dysphonic/hypophonic but does fluctuate. Pill rolling tremor, masked facies, head forward. No frank rigidity but bradykinesias. Motor 4/5 RUE and 3-4/5 LUE limited by clavicle pain. LE's bilaterally 3-4/5. No focal sensory findings. DTR's trace to absent  Psychiatric:     Comments: Flat but overall pleasant and cooperative        Lab Results Last 48 Hours        Results for orders placed or performed during the hospital encounter of 05/13/20 (from the past 48 hour(s))  Basic metabolic panel     Status: Abnormal    Collection Time: 05/15/20  3:48 AM  Result Value Ref Range    Sodium 141 135 - 145 mmol/L    Potassium 4.3  3.5 - 5.1 mmol/L      Comment: NO VISIBLE HEMOLYSIS    Chloride 105 98 - 111 mmol/L    CO2 26 22 - 32 mmol/L    Glucose, Bld 92 70 - 99 mg/dL      Comment: Glucose reference range applies only to samples  taken after fasting for at least 8 hours.    BUN 19 8 - 23 mg/dL    Creatinine, Ser 0.80 0.44 - 1.00 mg/dL    Calcium 8.6 (L) 8.9 - 10.3 mg/dL    GFR, Estimated >60 >60 mL/min      Comment: (NOTE) Calculated using the CKD-EPI Creatinine Equation (2021)      Anion gap 10 5 - 15      Comment: Performed at [facility] [address] [place], [place] [address]  Magnesium     Status: None    Collection Time: 05/15/20  3:48 AM  Result Value Ref Range    Magnesium 1.9 1.7 - 2.4 mg/dL      Comment: Performed at [facility] [address] [place], [place] [address]  Basic metabolic panel     Status: Abnormal    Collection Time: 05/16/20  4:57 AM  Result Value Ref Range    Sodium 140 135 - 145 mmol/L    Potassium 3.9 3.5 - 5.1 mmol/L    Chloride 104 98 - 111 mmol/L    CO2 27 22 - 32 mmol/L    Glucose, Bld 101 (H) 70 - 99 mg/dL      Comment: Glucose reference range applies only to samples taken after fasting for at least 8 hours.    BUN 16 8 - 23 mg/dL    Creatinine, Ser 0.82 0.44 - 1.00 mg/dL    Calcium 9.0 8.9 - 10.3 mg/dL    GFR, Estimated >60 >60 mL/min      Comment: (NOTE) Calculated using the CKD-EPI Creatinine Equation (2021)      Anion gap 9 5 - 15      Comment: Performed at [facility] [address] [place], [place] [address]  Magnesium     Status: None    Collection Time: 05/16/20  4:57 AM  Result Value Ref Range    Magnesium 1.9 1.7 - 2.4 mg/dL      Comment: Performed at [facility] [address], [place], [place] [address]       Imaging Results (Last 48 hours)  ECHOCARDIOGRAM COMPLETE   Result Date: 05/14/2020    ECHOCARDIOGRAM REPORT   Patient Name:   HSER BELANGER Date of Exam: 05/14/2020 Medical Rec #:  [id number]             Height:       62.0 in Accession #:    [id number]           Weight:       130.0 lb Date of Birth:  [birth year]            BSA:          1.592 m Patient Age:    72 years             BP:           133/62 mmHg Patient Gender: F                    HR:           73 bpm. Exam Location:  Inpatient Procedure: 2D Echo Indications:    syncope 780.2  History:        Patient has prior history of Echocardiogram examinations, most                 recent 08/11/2017. Abdominal aortic aneurysm; Risk  Factors:Dyslipidemia.  Sonographer:    Johny Chess Referring Phys: [id number] AMY N [facility] IMPRESSIONS  1. Left ventricular ejection fraction, by estimation, is 30 to 35%. The left ventricle has moderately decreased function. The left ventricle demonstrates regional wall motion abnormalities (see scoring diagram/findings for description). Left ventricular  diastolic parameters are consistent with Grade I diastolic dysfunction (impaired relaxation). There is moderate hypokinesis of the left ventricular, entire anteroseptal wall, inferoseptal wall and inferior wall. Hypokinesis of the septal/inferior walls,  preserved at apex.  2. Right ventricular systolic function is normal. The right ventricular size is normal. There is normal pulmonary artery systolic pressure.  3. Left atrial size was severely dilated.  4. The mitral valve is normal in structure. Mild mitral valve regurgitation.  5. The aortic valve is tricuspid. There is mild calcification of the aortic valve. There is mild thickening of the aortic valve. Aortic valve regurgitation is moderate to severe. Mild to moderate aortic valve sclerosis/calcification is present, without any evidence of aortic stenosis. Comparison(s): Prior images reviewed side by side. Changes from prior study are noted. Conclusion(s)/Recommendation(s): I reviewed 2019 study side by side. On my read, there was mild-moderate LVEF reduction with mild hypokinesis of the septal and inferior walls  in 2019. These abnormalities are now more prominent/severe on current study. Aortic regurgitation moderate-severe. Findings communicated with attending Dr. [place] at 1:44 PM via secure chat. FINDINGS  Left Ventricle: Left ventricular ejection fraction, by estimation, is 30 to 35%. The left ventricle has moderately decreased function. The left ventricle demonstrates regional wall motion abnormalities. Moderate hypokinesis of the left ventricular, entire anteroseptal wall, inferoseptal wall and inferior wall. The left ventricular internal cavity size was normal in size. There is no left ventricular hypertrophy. Abnormal (paradoxical) septal motion, consistent with left bundle branch block. Left ventricular diastolic parameters are consistent with Grade I diastolic dysfunction (impaired relaxation).  LV Wall Scoring: Hypokinesis of the septal/inferior walls, preserved at apex. Right Ventricle: The right ventricular size is normal. Right vetricular wall thickness was not well visualized. Right ventricular systolic function is normal. There is normal pulmonary artery systolic pressure. The tricuspid regurgitant velocity is 2.79 m/s, and with an assumed right atrial pressure of 3 mmHg, the estimated right ventricular systolic pressure is 28.7 mmHg. Left Atrium: Left atrial size was severely dilated. Right Atrium: Right atrial size was normal in size. Pericardium: There is no evidence of pericardial effusion. Mitral Valve: The mitral valve is normal in structure. Mild mitral valve regurgitation. Tricuspid Valve: The tricuspid valve is normal in structure. Tricuspid valve regurgitation is mild . No evidence of tricuspid stenosis. Aortic Valve: The aortic valve is tricuspid. There is mild calcification of the aortic valve. There is mild thickening of the aortic valve. Aortic valve regurgitation is moderate to severe. Aortic regurgitation PHT measures 266 msec. Mild to moderate aortic valve sclerosis/calcification is  present, without any evidence of aortic stenosis. Pulmonic Valve: The pulmonic valve was not well visualized. Pulmonic valve regurgitation is trivial. No evidence of pulmonic stenosis. Aorta: The aortic root, ascending aorta, aortic arch and descending aorta are all structurally normal, with no evidence of dilitation or obstruction. IAS/Shunts: The atrial septum is grossly normal.  LEFT VENTRICLE PLAX 2D LVIDd:         4.90 cm      Diastology LVIDs:         3.50 cm      LV e' medial:    4.79 cm/s LV PW:         1.00  cm      LV E/e' medial:  15.1 LV IVS:        1.00 cm      LV e' lateral:   5.66 cm/s LVOT diam:     1.80 cm      LV E/e' lateral: 12.8 LV SV:         51 LV SV Index:   32 LVOT Area:     2.54 cm  LV Volumes (MOD) LV vol d, MOD A2C: 120.0 ml LV vol d, MOD A4C: 104.0 ml LV vol s, MOD A2C: 70.7 ml LV vol s, MOD A4C: 79.7 ml LV SV MOD A2C:     49.3 ml LV SV MOD A4C:     104.0 ml LV SV MOD BP:      38.3 ml RIGHT VENTRICLE             IVC RV S prime:     14.00 cm/s  IVC diam: 1.10 cm TAPSE (M-mode): 2.3 cm LEFT ATRIUM             Index       RIGHT ATRIUM           Index LA diam:        2.90 cm 1.82 cm/m  RA Area:     14.60 cm LA Vol (A2C):   80.8 ml 50.76 ml/m RA Volume:   34.20 ml  21.48 ml/m LA Vol (A4C):   84.0 ml 52.77 ml/m LA Biplane Vol: 86.3 ml 54.21 ml/m  AORTIC VALVE LVOT Vmax:   115.00 cm/s LVOT Vmean:  72.500 cm/s LVOT VTI:    0.199 m AI PHT:      266 msec  AORTA Ao Root diam: 2.90 cm MITRAL VALVE                TRICUSPID VALVE MV Area (PHT): 3.42 cm     TR Peak grad:   31.1 mmHg MV Decel Time: 222 msec     TR Vmax:        279.00 cm/s MV E velocity: 72.40 cm/s MV A velocity: 115.00 cm/s  SHUNTS MV E/A ratio:  0.63         Systemic VTI:  0.20 m                             Systemic Diam: 1.80 cm Buford Dresser MD Electronically signed by Buford Dresser MD Signature Date/Time: 05/14/2020/1:44:42 PM    Final              Medical Problem List and Plan: 1.  Decreased functional  mobility secondary to left comminuted clavicle fracture and right knee contusion after fall.  Nonweightbearing.  Follow-up Dr. Mardelle Matte.  Shoulder sling as directed.  Patient was cleared to use left upper extremity for support during ambulation with a walker but no aggressive active range of motion or passive range of motion.             -patient may shower             -ELOS/Goals: 12-14 days, supervision goals 2.  Antithrombotics: -DVT/anticoagulation: Subcutaneous heparin.  -antiplatelet therapy: N/A 3. Pain Management: Hydrocodone as needed             -local heat/ice to shoulder as needed 4. Mood: Zoloft 25 mg daily, Ativan 0.5 mg every 6 hours as needed anxiety             -  antipsychotic agents: N/A 5. Neuropsych: This patient is capable of making decisions on her own behalf. 6. Skin/Wound Care: Routine skin checks 7. Fluids/Electrolytes/Nutrition: Routine in and outs with follow-up chemistries 8.  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  Lasix 40 mg daily.                -daily weights             - Cardiology service follow-up 9.  Parkinson's disease.  Sinemet 25-100 mg 2 tabs 3 times daily 10.  Hypertension.  Lisinopril 2.5 mg daily, Zebeta 2.5 mg daily 11.  Hyperlipidemia.  Pravachol 12.  Fusiform thoracic aneurysm ascending aorta, chronic and stable.  Follow-up outpatient Dr. Clydia Llano, PA-C 05/16/2020   I have personally performed a face to face diagnostic evaluation of this patient and formulated the key components of the plan.  Additionally, I have personally reviewed laboratory data, imaging studies, as well as relevant notes and concur with the physician assistant's documentation above.  The patient's status has not changed from the original H&P.  Any changes in documentation from the acute care chart have been noted above.  This encounter/H&P was completed on 10/30/212 and is being placed in the rehab  chart today to associate it with the correct encounter.   [name], MD, [facility]

## 2020-05-18 NOTE — Progress Notes (Signed)
Orthopedic Tech Progress Note Patient Details:  Elizabeth Mccullough 07/08/1941 [id number]  Ortho Devices Type of Ortho Device: Sling immobilizer Ortho Device/Splint Location: Left Upper Extremity Ortho Device/Splint Interventions: Ordered, Delivered       Tammy Sours 05/18/2020, 1:32 PM

## 2020-05-18 NOTE — Evaluation (Signed)
Occupational Therapy Assessment and Plan  Patient Details  Name: TOMEKO SCOVILLE MRN: [id number] Date of Birth: 1941/02/21  OT Diagnosis: abnormal posture, acute pain, cognitive deficits, muscle weakness (generalized) and coordination disorder Rehab Potential: Rehab Potential (ACUTE ONLY): Good ELOS: 12-14 days   Today's Date: 05/18/2020 OT Individual Time: 0626-9485 OT Individual Time Calculation (min): 72 min     Hospital Problem: Principal Problem:   Clavicle fracture   Past Medical History:  Past Medical History:  Diagnosis Date  . AAA (abdominal aortic aneurysm) ([place])   . Carotid artery disease ([place]) 01/12/2017   Carotid US 6/18: Bilat < 50%; repeat in 12/2000  . Chronic combined systolic and diastolic CHF (congestive heart failure) ([place]) 03/01/2017   NICM // [place] 8/18: normal coronary arteries /  Echo 8/18: EF 20-25, mod AI, mod MR, PASP 32 // Echo 1/19: EF 55-60, no RWMA, Gr 1 DD, mod AI, mild MR, PASP 32  . Hyperlipidemia   . Parkinson's disease [facility])    Past Surgical History:  Past Surgical History:  Procedure Laterality Date  . BREAST CYST ASPIRATION  1965   Right Breast  . HAMMER TOE SURGERY  04/10/02   Left Toe  . KNEE SURGERY Right 10/17/15   meniscus repair  . NASAL SEPTUM SURGERY  1980  . RIGHT/LEFT HEART CATH AND CORONARY ANGIOGRAPHY N/A 03/03/2017   Procedure: RIGHT/LEFT HEART CATH AND CORONARY ANGIOGRAPHY;  Surgeon: Nelva Bush, MD;  Location: [facility];  Service: Cardiovascular;  Laterality: N/A;  . THORACIC AORTOGRAM N/A 03/03/2017   Procedure: Thoracic Aortogram;  Surgeon: Nelva Bush, MD;  Location: [facility];  Service: Cardiovascular;  Laterality: N/A;    Assessment & Plan Clinical Impression: Kayline BShaughnessyis an 79 year old right-handed female with history of hypertension, Parkinson's disease maintained on Sinemet, 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 Acie Fredrickson, hyperlipidemia,  fusiform ascending aneurysm, mild carotid disease, moderate aortic insufficiency with mild dilatation of the aortic root. Per chart review patient resides independent living facility [place] since March 2021. Ambulates with a rolling walker but does endorse frequent falls. She was able to bathe dress feet and told herself and goes to the dining hall for meals.. Daughter and family plan to hire assistance as needed. Presented 05/13/2020 after falling 3 times on day of presentation. Denied loss of consciousness. Admission chemistries glucose 100, troponin 26>29, hemoglobin 10.6, urinalysis negative nitrite. Cranial CT scan as well as CT cervical spine showed no acute intracranial pathology and no acute traumatic cervical spine pathology. X-rays right knee no acute osseous abnormality. Findings of comminuted displaced left clavicle fracture. Follow-up orthopedic services Dr. Mardelle Matte in regards to left clavicle fracture also right knee contusion with conservative care and no surgical intervention. Nonweightbearing left upper extremity with shoulder sling. Cardiology service follow-up for history of CHF with latest chest x-ray showing probable left pleural effusion and left lung base atelectasis and echocardiogram completed showing ejection fraction 30 to 46% grade 1 diastolic dysfunction, moderate hypokinesis LV... Patient currently remains on Lasix 40 mg daily as well as lisinopril with Zebeta 2.5 mg daily. Subcutaneous heparin added for DVT prophylaxis. Therapy evaluations completed and patient was admitted for a comprehensive rehab program.    Patient currently requires mod with basic self-care skills secondary to muscle weakness, decreased cardiorespiratoy endurance, impaired timing and sequencing, decreased coordination and decreased motor planning, decreased initiation, decreased attention, decreased awareness, decreased problem solving, decreased safety awareness, decreased memory and  delayed processing and decreased sitting balance, decreased  standing balance, decreased postural control and difficulty maintaining precautions.  Prior to hospitalization, patient could complete BADLs with modified independent .  Patient will benefit from skilled intervention to increase independence with basic self-care skills prior to discharge home to ILF.  Anticipate patient will require 24 hour supervision and minimal physical assistance and follow up home health.  OT - End of Session Endurance Deficit: Yes OT Assessment Rehab Potential (ACUTE ONLY): Good OT Barriers to Discharge: Decreased caregiver support;Lack of/limited family support OT Patient demonstrates impairments in the following area(s): Balance;Safety;Cognition;Endurance;Motor;Pain OT Basic ADL's Functional Problem(s): Grooming;Bathing;Dressing;Toileting OT Advanced ADL's Functional Problem(s): Light Housekeeping OT Transfers Functional Problem(s): Toilet;Tub/Shower OT Additional Impairment(s): Fuctional Use of Upper Extremity OT Plan OT Intensity: Minimum of 1-2 x/day, 45 to 90 minutes OT Frequency: 5 out of 7 days OT Duration/Estimated Length of Stay: 12-14 days OT Treatment/Interventions: Balance/vestibular training;Discharge planning;Pain management;Self Care/advanced ADL retraining;Therapeutic Activities;UE/LE Coordination activities;Therapeutic Exercise;Patient/family education;Functional mobility training;Disease mangement/prevention;Cognitive remediation/compensation;Community reintegration;DME/adaptive equipment instruction;Psychosocial support;UE/LE Strength taining/ROM;Wheelchair propulsion/positioning OT Self Feeding Anticipated Outcome(s): No goal OT Basic Self-Care Anticipated Outcome(s): Supervision OT Toileting Anticipated Outcome(s): Supervision OT Bathroom Transfers Anticipated Outcome(s): Supervision-CGA OT Recommendation Recommendations for Other Services: Speech consult Patient destination: Home Follow  Up Recommendations: Other (comment) (f/u at ILF) Equipment Recommended: None recommended by OT   OT Evaluation Precautions/Restrictions  Precautions Precautions: Fall Precaution Comments: Per MD, ok to use walker but encourage pt to avoid pushing through L UE too much Required Braces or Orthoses: Sling Restrictions Weight Bearing Restrictions: Yes LUE Weight Bearing: Non weight bearing Other Position/Activity Restrictions: NWB in sling, but ok to use walker without pushing through L UE too much Vital Signs Therapy Vitals Temp: (!) 97.5 F (36.4 C) Pulse Rate: 64 Resp: 17 BP: (!) 113/59 Patient Position (if appropriate): Sitting Oxygen Therapy SpO2: 96 % O2 Device: Room Air Home Living/Prior [place] expects to be discharged to:: Unsure Living Arrangements: Alone Available Help at Discharge: Other (Comment) (per rehab admission documentation, family plans to hire needed assistance at time of d/c) Type of Home: Independent living facility Home Access: Level entry [place]: One level Bathroom Shower/Tub: Multimedia programmer: Handicapped height Bathroom Accessibility: Yes  Lives With: Alone, Other (Comment) ([place]) IADL History Homemaking Responsibilities: No (pt reports engaging in homemaking tasks (such as laundry and meal prep), however per chart, her facility assists with these IADL tasks) Occupation: 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 We were unable to find them in the room) Perception  Perception: Within  Functional Limits Praxis Praxis: Impaired Praxis Impairment Details: Initiation;Motor planning;Perseveration Cognition Overall Cognitive Status: History of cognitive impairments - at baseline Arousal/Alertness: Awake/alert Orientation Level: Person;Place;Situation Person: Oriented Place: Oriented Situation: Oriented Year: 2021 Month: October Day of Week: Incorrect Memory: Impaired Memory Impairment: Decreased recall of new information;Decreased short term memory Immediate Memory Recall: Sock;Blue;Bed Memory Recall Sock: Without Cue Memory Recall Blue: Without Cue Memory Recall Bed: With Cue Attention: Sustained Focused Attention: Appears intact Sustained Attention: Impaired Awareness: Impaired Awareness Impairment: Anticipatory impairment Problem Solving: Impaired Behaviors: Perseveration Safety/Judgment: Impaired Comments: Pt requires cues to adhere to her NWB precautions Sensation Coordination Gross Motor Movements are Fluid and  Coordinated: No Fine Motor Movements are Fluid and Coordinated: No Coordination and Movement Description: Bradykinesic, resting/intention tremor in the Rt UE Finger Nose Finger Test: mild dysmetria bilaterally Motor  Motor Motor: Abnormal postural alignment and control Motor - Skilled Clinical Observations: motor presentation of Parkinson's Disease  Trunk/Postural Assessment  Cervical Assessment Cervical Assessment: Exceptions to [facility] (forward head) Thoracic Assessment Thoracic Assessment: Exceptions to [facility] (kyphotic) Lumbar Assessment Lumbar Assessment: Exceptions to [facility] (posterior pelvic tilt) Postural Control Postural Control: Deficits on evaluation (impaired, posterior balance loses, stooped posturing during standing tasks)  Balance Balance Balance Assessed: Yes Dynamic Sitting Balance Dynamic Sitting - Balance Support: No upper extremity supported;Feet supported;During functional activity Dynamic Sitting - Level of Assistance: 4: Min  assist (attempting to thread pants over feet) Dynamic Standing Balance Dynamic Standing - Balance Support: No upper extremity supported;During functional activity Dynamic Standing - Level of Assistance: 4: Min assist (perihygiene completion) Extremity/Trunk Assessment RUE Assessment RUE Assessment: Within Functional Limits LUE Assessment LUE Assessment: Exceptions to WFL (NWB, no formal testing, pt able to use Lt UE functionally during all self care tasks)  Care Tool Care Tool Self Care Eating        Oral Care  Oral care, brush teeth, clean dentures activity did not occur: Refused      Bathing   Body parts bathed by patient: Right arm;Left arm;Chest;Abdomen;Front perineal area;Right upper leg;Left upper leg;Face Body parts bathed by helper: Buttocks;Right lower leg;Left lower leg   Assist Level: Minimal Assistance - Patient > 75%    Upper Body Dressing(including orthotics)   What is the patient wearing?: Pull over shirt   Assist Level: Contact Guard/Touching assist (standing to pull shirt over trunk)    Lower Body Dressing (excluding footwear)   What is the patient wearing?: Incontinence brief;Pants Assist for lower body dressing: Maximal Assistance - Patient 25 - 49%    Putting on/Taking off footwear   What is the patient wearing?: Non-skid slipper socks;Ted hose Assist for footwear: Total Assistance - Patient < 25%       Care Tool Toileting Toileting activity   Assist for toileting: Maximal Assistance - Patient 25 - 49%          Toilet transfer   Assist Level: Minimal Assistance - Patient > 75%      Refer to Care Plan for Long Term Goals  SHORT TERM GOAL WEEK 1 OT Short Term Goal 1 (Week 1): Pt will complete 2/3 components of donning pants OT Short Term Goal 2 (Week 1): Pt will complete 1 grooming task while standing at the sink with no more than Min balance assistance OT Short Term Goal 3 (Week 1): Pt will complete shower transfer with LRAD and Min  A  Recommendations for other services: Other: SLP   Skilled Therapeutic Intervention Skilled OT session completed with focus on initial evaluation, education on OT role/POC, and establishment of patient-centered goals.   Pt greeted in bed, finishing up lunch, pain reportedly manageable for tx. Pt initially declined toileting, agreeable to engage in other ADL tasks. When OT left and arrived shortly after, pt reporting that she needed to have a BM. Supine<sit completed with max cuing and assistance in order to maintain Lt UE NWB. Pt unable to verbalize her WB precautions or exhibit understanding throughout session. Min A for ambulatory transfer to the elevated toilet using RW, note that pt had 1 posterior LOB when navigating over the bathroom threshold, needing Mod A to correct. Pt had BM void and also completed bathing/dressing  tasks at sit<stand level using the RW. Pt needing assistance to reach her feet functionally, noted delayed initiation, decreased sustained attention to task, tangential speech, and external distractibility. Bradykinesic movement patterns with resting and intention tremors in the Rt hand, pt tended to perseverate on adjusting UB/LB clothing. Min balance assistance in standing with occasional posterior LOBs, Mod A to correct. Assistance for perihygiene in the back to ensure thoroughness post BM, and then she ambulated to the w/c in the room. Pt washed her hands while seated, declined oral care as she was adamant that she wanted to use her personal toothbrush (which was not present in the room) vs hospital toothbrush. Her arm sling arrived at this time so donned sling with Total A. Reminded pt of her Lt UE NWB status. Left pt in the w/c with all needs within reach, safety belt fastened, in care of nursing staff to assess BP and provide medicine (which she asked for at this time).    ADL ADL Eating: Set up Grooming: Supervision/safety Where Assessed-Grooming: Sitting at sink Upper  Body Bathing: Supervision/safety Where Assessed-Upper Body Bathing: Other (Comment) (sitting on elevated toilet) Lower Body Bathing: Moderate assistance Where Assessed-Lower Body Bathing: Other (Comment) (sit<stand from elevated toilet) Upper Body Dressing: Contact guard Where Assessed-Upper Body Dressing: Other (Comment) (sitting on toilet and standing in front of toilet) Lower Body Dressing: Maximal assistance Where Assessed-Lower Body Dressing: Other (Comment) (sit<stand from elevated toilet) Toileting: Maximal assistance Where Assessed-Toileting: Glass blower/designer: Minimal Software engineer Method: Ambulating (RW) Science writer: Raised toilet seat;Grab bars Tub/Shower Transfer: Not assessed Mobility   Min-Mod A toilet transfer using RW   Discharge Criteria: Patient will be discharged from OT if patient refuses treatment 3 consecutive times without medical reason, if treatment goals not met, if there is a change in medical status, if patient makes no progress towards goals or if patient is discharged from hospital.  The above assessment, treatment plan, treatment alternatives and goals were discussed and mutually agreed upon: by patient  Skeet Simmer 05/18/2020, 4:26 PM

## 2020-05-18 NOTE — Progress Notes (Signed)
PHYSICAL MEDICINE & REHABILITATION PROGRESS NOTE   Subjective/Complaints: Pt reports that she's constipated. Not getting enough fluids. Po intake in general hasn't been great  ROS: Limited due to cognitive/behavioral    Objective:   No results found. Recent Labs    05/17/20 1551  WBC 7.4  HGB 11.4*  HCT 35.1*  PLT 360   Recent Labs    05/16/20 0457 05/17/20 1551  NA 140  --   K 3.9  --   CL 104  --   CO2 27  --   GLUCOSE 101*  --   BUN 16  --   CREATININE 0.82 0.94  CALCIUM 9.0  --     Intake/Output Summary (Last 24 hours) at 05/18/2020 0940 Last data filed at 05/18/2020 0815 Gross per 24 hour  Intake 415 ml  Output 300 ml  Net 115 ml        Physical Exam: Vital Signs Blood pressure (!) 122/58, pulse 67, temperature 97.9 F (36.6 C), resp. rate 16, height 5\' 2"  (1.575 m), weight 62.8 kg, last menstrual period 07/19/1988, SpO2 97 %. Gen: no distress, frail appearing HEENT: oral mucosa pink and moist, NCAT Cardio: Reg rate Chest: normal effort, normal rate of breathing Abd: soft, non-distended Ext: no edema Skin: intact Neuro:masked facies,dysarthic/monotonic speech. Fine tremor and bradykinesias. Delays in processing. Normal language. Moves all 4's with limitations LUE d/t clavicle Musculoskeletal: bruising around left scapula. Left shoulder TTP and with minimal PROM.  Psych: pleasant, normal affect    Assessment/Plan: 1. Functional deficits secondary to gait disorder, left clavicular fx which require 3+ hours per day of interdisciplinary therapy in a comprehensive inpatient rehab setting.  Physiatrist is providing close team supervision and 24 hour management of active medical problems listed below.  Physiatrist and rehab team continue to assess barriers to discharge/monitor patient progress toward functional and medical goals  Care Tool:  Bathing              Bathing assist       Upper Body Dressing/Undressing Upper body  dressing        Upper body assist      Lower Body Dressing/Undressing Lower body dressing            Lower body assist       Toileting Toileting    Toileting assist Assist for toileting: Minimal Assistance - Patient > 75%     Transfers Chair/bed transfer  Transfers assist     Chair/bed transfer assist level: Minimal Assistance - Patient > 75%     Locomotion Ambulation   Ambulation assist              Walk 10 feet activity   Assist           Walk 50 feet activity   Assist           Walk 150 feet activity   Assist           Walk 10 feet on uneven surface  activity   Assist           Wheelchair     Assist               Wheelchair 50 feet with 2 turns activity    Assist            Wheelchair 150 feet activity     Assist          Blood pressure (!) 122/58, pulse 67, temperature 97.9 F (  36.6 C), resp. rate 16, height 5\' 2"  (1.575 m), weight 62.8 kg, last menstrual period 07/19/1988, SpO2 97 %.   Medical Problem List and Plan: 1.  Decreased functional mobility secondary to left comminuted clavicle fracture and right knee contusion after fall.  Nonweightbearing.  Follow-up Dr. Mardelle Matte.  Shoulder sling as directed.  Patient was cleared to use left upper extremity for support during ambulation with a walker but no aggressive active range of motion or passive range of motion.             -patient may shower             -ELOS/Goals: 12-14 days, supervision goals  -begin therapies today 2.  Antithrombotics: -DVT/anticoagulation: Subcutaneous heparin.  -antiplatelet therapy: N/A 3. Pain Management: Hydrocodone as needed             -local heat/ice to shoulder as needed 4. Mood: Zoloft 25 mg daily, Ativan 0.5 mg every 6 hours as needed anxiety             -antipsychotic agents: N/A 5. Neuropsych: This patient is capable of making decisions on her own behalf. 6. Skin/Wound Care: Routine skin checks 7.  Fluids/Electrolytes/Nutrition: Routine in and outs with follow-up chemistries 8.  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  Lasix 40 mg daily.                -daily weights are stable right now Filed Weights   05/17/20 1522 05/17/20 1653 05/18/20 0500  Weight: 62.4 kg 62.4 kg 62.8 kg               - Cardiology service follow-up 9.  Parkinson's disease.  Sinemet 25-100 mg 2 tabs 3 times daily 10.  Hypertension.  Lisinopril 2.5 mg daily, Zebeta 2.5 mg daily 11.  Hyperlipidemia.  Pravachol 12.  Fusiform thoracic aneurysm ascending aorta, chronic and stable.  Follow-up outpatient Dr. Darcey Nora 13. Constipation:  10/31-give sorbitol today, dulcolax supp and enema if needed   -improve po intake especially fluids      LOS: 1 days A FACE TO FACE EVALUATION WAS PERFORMED  [name] 05/18/2020, 9:40 AM

## 2020-05-18 NOTE — Progress Notes (Signed)
Physical Therapy Session Note  Patient Details  Name: Elizabeth Mccullough MRN: [id number] Date of Birth: [birth year]  Today's Date: 05/18/2020 PT Individual Time: 1550-1630 PT Individual Time Calculation (min): 40 min   Short Term Goals: Week 1:  PT Short Term Goal 1 (Week 1): Pt will complete least restrictive transfers with min A consistently PT Short Term Goal 2 (Week 1): Pt will ambulate x 75 ft with LRAD and min A PT Short Term Goal 3 (Week 1): Pt will demonstrate recall of WBing precautions with bed mobility and transfers 50% of the time  Skilled Therapeutic Interventions/Progress Updates:    Pt received supine in bed, agreeable with encouragement to participate in therapy session. Pt reports pain in LUE with mobility, none at rest. Supine to sitting EOB with min A for some trunk control, increased time needed and cues to not use LUE to assist. LUE in sling this PM. Stand pivot transfer to w/c with min HHA. Pt requesting to brush her hair, provided pt with brush and she is able to brush her hair while seated in w/c at sink with setup A with use of RUE. Car transfer with mod A for BLE management in/out of car and cues for safe transfer technique. Stand pivot transfer w/c to/from car with min HHA. Ambulation x 44 ft with min HHA, increased tolerance for gait training from AM session. Pt ambulates with decreased gait speed, shuffling gait pattern, and decreased step length bilaterally. Pt then reports bowel incontinence. Pt able to maintain standing at sink with RUE support with Supervision while brief change and pericare performed. Pt is able to pull up brief and pants with mod A. Pt requests to return to bed at end of session. Stand pivot transfer back to bed with min HHA. Sit to supine mod A for BLE management. Pt left supine in bed with needs in reach, bed alarm in place at end of session.  Therapy Documentation Precautions:  Precautions Precautions: Fall Precaution Comments: Per MD, ok  to use walker but encourage pt to avoid pushing through L UE too much Required Braces or Orthoses: Sling Restrictions Weight Bearing Restrictions: Yes LUE Weight Bearing: Non weight bearing Other Position/Activity Restrictions: NWB in sling, but ok to use walker without pushing through L UE too much     Therapy/Group: Individual Therapy   Excell Seltzer, PT, DPT  05/18/2020, 4:31 PM

## 2020-05-18 NOTE — IPOC Note (Signed)
Individualized overall Plan of Care [facility]) Patient Details Name: Elizabeth Mccullough MRN: [id number] DOB: [birth year]  Admitting Diagnosis: Clavicle fracture  Hospital Problems: Principal Problem:   Clavicle fracture Active Problems:   Anemia   Essential hypertension   Parkinson's disease ([place])   Chronic combined systolic and diastolic congestive heart failure ([place])   Clavicle pain     Functional Problem List: Nursing Bladder, Bowel, Pain, Safety  PT Balance, Endurance, Pain, Safety  OT Balance, Safety, Cognition, Endurance, Motor, Pain  SLP    TR         Basic ADL's: OT Grooming, Bathing, Dressing, Toileting     Advanced  ADL's: OT Light Housekeeping     Transfers: PT Bed Mobility, Bed to Chair, Car, Furniture, Futures trader, Metallurgist: PT Ambulation, Emergency planning/management officer, Stairs     Additional Impairments: OT Fuctional Use of Upper Extremity  SLP        TR      Anticipated Outcomes Item Anticipated Outcome  Self Feeding No goal  Swallowing      Basic self-care  Supervision  Toileting  Supervision   Bathroom Transfers Supervision-CGA  Bowel/Bladder  min assist  Transfers  Supervision  Locomotion  Supervision with LRAD  Communication     Cognition     Pain  less  than 3 out of 10  Safety/Judgment  min assist   Therapy Plan: PT Intensity: Minimum of 1-2 x/day ,45 to 90 minutes PT Frequency: 5 out of 7 days PT Duration Estimated Length of Stay: 10-12 days OT Intensity: Minimum of 1-2 x/day, 45 to 90 minutes OT Frequency: 5 out of 7 days OT Duration/Estimated Length of Stay: 12-14 days      Team Interventions: Nursing Interventions Patient/Family Education, Pain Management, Bladder Management, Bowel Management, Discharge Planning  PT interventions Ambulation/gait training, Balance/vestibular training, Cognitive remediation/compensation, Community reintegration, Discharge planning, Disease management/prevention,  DME/adaptive equipment instruction, Functional mobility training, Neuromuscular re-education, Pain management, Patient/family education, Splinting/orthotics, Therapeutic Activities, Therapeutic Exercise, UE/LE Strength taining/ROM, UE/LE Coordination activities  OT Interventions Balance/vestibular training, Discharge planning, Pain management, Self Care/advanced ADL retraining, Therapeutic Activities, UE/LE Coordination activities, Therapeutic Exercise, Patient/family education, Functional mobility training, Disease mangement/prevention, Cognitive remediation/compensation, Community reintegration, Engineer, drilling, Psychosocial support, UE/LE Strength taining/ROM, Wheelchair propulsion/positioning  SLP Interventions    TR Interventions    SW/CM Interventions Discharge Planning, Psychosocial Support, Patient/Family Education   Barriers to Discharge MD  Medical stability and Weight bearing restrictions  Nursing      PT Decreased caregiver support, Incontinence    OT Decreased caregiver support, Lack of/limited family support    SLP      SW       Team Discharge Planning: Destination: PT-Home (ILF) ,OT- Home , SLP-  Projected Follow-up: PT-Home health PT, 24 hour supervision/assistance, OT-  Other (comment) (f/u at ILF), SLP-  Projected Equipment Needs: PT-Rolling walker with 5" wheels, OT- None recommended by OT, SLP-  Equipment Details: PT-owns rollator, may need RW upon d/c, OT-  Patient/family involved in discharge planning: PT- Patient,  OT-Patient, SLP-   MD ELOS: 10-14 days. Medical Rehab Prognosis:  Good Assessment: 79 year old right-handed female with history of hypertension, Parkinson's disease maintained on Sinemet, 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 Presented 05/13/2020 after  falling 3 times on day of presentation.  Denied loss of consciousness.  Admission chemistries glucose  100, troponin 26>29, hemoglobin 10.6, urinalysis negative nitrite.  Cranial CT scan as well as CT cervical spine showed no acute intracranial pathology and no acute traumatic cervical spine pathology.  X-rays right knee no acute osseous abnormality.  Findings of comminuted displaced left clavicle fracture.  Follow-up orthopedic services Dr. Mardelle Matte in regards to left clavicle fracture also right knee contusion with conservative care and no surgical intervention.  Nonweightbearing left upper extremity with shoulder sling.  Cardiology service follow-up for history of CHF with latest chest x-ray showing probable left pleural effusion and left lung base atelectasis and echocardiogram completed showing ejection fraction 30 to 60% grade 1 diastolic dysfunction, moderate hypokinesis LV...  Patient currently remains on Lasix 40 mg daily as well as lisinopril with Zebeta 2.5 mg daily. Patient with resulting functional deficits with mobility, transfers, endurance, self-care.  Will set goals for Supervision with PT/OT.    Due to the current state of emergency, patients may not be receiving their 3-hours of Medicare-mandated therapy.  See Team Conference Notes for weekly updates to the plan of care

## 2020-05-18 NOTE — Evaluation (Signed)
Physical Therapy Assessment and Plan  Patient Details  Name: Elizabeth Mccullough MRN: [id number] Date of Birth: Apr 17, 1941  PT Diagnosis: Abnormal posture, Abnormality of gait, Cognitive deficits and Difficulty walking Rehab Potential: Good ELOS: 10-12 days   Today's Date: 05/18/2020 PT Individual Time: 0800-0915 PT Individual Time Calculation (min): 75 min    Hospital Problem: Principal Problem:   Clavicle fracture   Past Medical History:  Past Medical History:  Diagnosis Date  . AAA (abdominal aortic aneurysm) ([place])   . Carotid artery disease ([place]) 01/12/2017   Carotid US 6/18: Bilat < 50%; repeat in 12/2000  . Chronic combined systolic and diastolic CHF (congestive heart failure) ([place]) 03/01/2017   NICM // [place] 8/18: normal coronary arteries /  Echo 8/18: EF 20-25, mod AI, mod MR, PASP 32 // Echo 1/19: EF 55-60, no RWMA, Gr 1 DD, mod AI, mild MR, PASP 32  . Hyperlipidemia   . Parkinson's disease [facility] ([place]    Past Surgical History:  Past Surgical History:  Procedure Laterality Date  . BREAST CYST ASPIRATION  1965   Right Breast  . HAMMER TOE SURGERY  04/10/02   Left Toe  . KNEE SURGERY Right 10/17/15   meniscus repair  . NASAL SEPTUM SURGERY  1980  . RIGHT/LEFT HEART CATH AND CORONARY ANGIOGRAPHY N/A 03/03/2017   Procedure: RIGHT/LEFT HEART CATH AND CORONARY ANGIOGRAPHY;  Surgeon: Nelva Bush, MD;  Location: [place] [facility];  Service: Cardiovascular;  Laterality: N/A;  . THORACIC AORTOGRAM N/A 03/03/2017   Procedure: Thoracic Aortogram;  Surgeon: Nelva Bush, MD;  Location: [place] [facility];  Service: Cardiovascular;  Laterality: N/A;    Assessment & Plan Clinical Impression:  Elizabeth BShaughnessyis an 79 year old right-handed female with history of hypertension, Parkinson's disease maintained on Sinemet, 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 Acie Fredrickson, hyperlipidemia, fusiform ascending aneurysm, mild carotid disease, moderate  aortic insufficiency with mild dilatation of the aortic root. Per chart review patient resides independent living facility [place] since March 2021. Ambulates with a rolling walker but does endorse frequent falls. She was able to bathe dress feet and told herself and goes to the dining hall for meals.. Daughter and family plan to hire assistance as needed. Presented 05/13/2020 after falling 3 times on day of presentation. Denied loss of consciousness. Admission chemistries glucose 100, troponin 26>29, hemoglobin 10.6, urinalysis negative nitrite. Cranial CT scan as well as CT cervical spine showed no acute intracranial pathology and no acute traumatic cervical spine pathology. X-rays right knee no acute osseous abnormality. Findings of comminuted displaced left clavicle fracture. Follow-up orthopedic services Dr. Mardelle Matte in regards to left clavicle fracture also right knee contusion with conservative care and no surgical intervention. Nonweightbearing left upper extremity with shoulder sling. Cardiology service follow-up for history of CHF with latest chest x-ray showing probable left pleural effusion and left lung base atelectasis and echocardiogram completed showing ejection fraction 30 to 99% grade 1 diastolic dysfunction, moderate hypokinesis LV... Patient currently remains on Lasix 40 mg daily as well as lisinopril with Zebeta 2.5 mg daily. Subcutaneous heparin added for DVT prophylaxis. Therapy evaluations completed and patient was admitted for a comprehensive rehab program. Patient transferred to CIR on 05/17/2020 .   Patient currently requires mod with mobility secondary to decreased cardiorespiratoy endurance, unbalanced muscle activation and decreased coordination, decreased awareness, decreased problem solving, decreased safety awareness and decreased memory and decreased sitting balance, decreased standing balance, decreased postural control, decreased balance strategies and  difficulty maintaining precautions.  Prior  to hospitalization, patient was modified independent  with mobility and lived with Alone, Other (Comment) ([place]) in a Independent living facility home.  Home access is  Level entry.  Patient will benefit from skilled PT intervention to maximize safe functional mobility, minimize fall risk and decrease caregiver burden for planned discharge home with 24 hour assist.  Anticipate patient will benefit from follow up [facility] at discharge.  PT - End of Session Activity Tolerance: Tolerates 30+ min activity with multiple rests Endurance Deficit: Yes Endurance Deficit Description: frequent rest breaks during functional activity PT Assessment Rehab Potential (ACUTE/IP ONLY): Good PT Barriers to Discharge: Decreased caregiver support;Incontinence PT Patient demonstrates impairments in the following area(s): Balance;Endurance;Pain;Safety PT Transfers Functional Problem(s): Bed Mobility;Bed to Chair;Car;Furniture;Floor PT Locomotion Functional Problem(s): Ambulation;Wheelchair Mobility;Stairs PT Plan PT Intensity: Minimum of 1-2 x/day ,45 to 90 minutes PT Frequency: 5 out of 7 days PT Duration Estimated Length of Stay: 10-12 days PT Treatment/Interventions: Ambulation/gait training;Balance/vestibular training;Cognitive remediation/compensation;Community reintegration;Discharge planning;Disease management/prevention;DME/adaptive equipment instruction;Functional mobility training;Neuromuscular re-education;Pain management;Patient/family education;Splinting/orthotics;Therapeutic Activities;Therapeutic Exercise;UE/LE Strength taining/ROM;UE/LE Coordination activities PT Transfers Anticipated Outcome(s): Supervision PT Locomotion Anticipated Outcome(s): Supervision with LRAD PT Recommendation Recommendations for Other Services: Speech consult Follow Up Recommendations: Home health PT;24 hour supervision/assistance Patient destination: Home (ILF) Equipment  Recommended: Rolling walker with 5" wheels Equipment Details: owns rollator, may need RW upon d/c   PT Evaluation Precautions/Restrictions Precautions Precautions: Fall Precaution Comments: Per MD, ok to use walker but encourage pt to avoid pushing through L UE too much Required Braces or Orthoses: Sling Restrictions Weight Bearing Restrictions: Yes LUE Weight Bearing: Non weight bearing Other Position/Activity Restrictions: NWB in sling, but ok to use walker without pushing through L UE too much Home Living/Prior Functioning Home Living Available Help at Discharge: Other (Comment) (per preadmit not family to hire assist upon d/c) Type of Home: Independent living facility Home Access: Level entry Home Layout: One level  Lives With: Alone;Other (Comment) [facility] ILF) Prior Function Level of Independence: Independent with gait;Independent with transfers;Requires assistive device for independence Comments: Endorses frequent falls Vision/Perception  Perception Perception: Within Functional Limits Praxis Praxis: Intact  Cognition Overall Cognitive Status: History of cognitive impairments - at baseline Arousal/Alertness: Awake/alert Orientation Level: Oriented to person;Oriented to place;Oriented to time;Oriented to situation Attention: Focused;Sustained Focused Attention: Appears intact Sustained Attention: Impaired Memory: Impaired Memory Impairment: Decreased recall of new information;Decreased short term memory Awareness: Impaired Awareness Impairment: Anticipatory impairment Problem Solving: Impaired Behaviors: Perseveration Safety/Judgment: Impaired Sensation Sensation Light Touch: Appears Intact Proprioception: Appears Intact Coordination Gross Motor Movements are Fluid and Coordinated: No Fine Motor Movements are Fluid and Coordinated: No Coordination and Movement Description: impaired 2/2 pain, WBing restrictions, PD Motor  Motor Motor: Abnormal  postural alignment and control Motor - Skilled Clinical Observations: impaired 2/2 pain, WBing precautions, PD  Trunk/Postural Assessment  Cervical Assessment Cervical Assessment: Exceptions to [facility] (forward head) Thoracic Assessment Thoracic Assessment: Exceptions to [facility] (kyphotic) Lumbar Assessment Lumbar Assessment: Exceptions to [facility] (posterior pelvic tilt) Postural Control Postural Control: Deficits on evaluation (delayed/insufficient)  Balance Balance Balance Assessed: Yes Static Sitting Balance Static Sitting - Balance Support: No upper extremity supported;Feet supported Static Sitting - Level of Assistance: 5: Stand by assistance Dynamic Sitting Balance Dynamic Sitting - Balance Support: No upper extremity supported;Feet supported;During functional activity Dynamic Sitting - Level of Assistance: 4: Min Insurance risk surveyor Standing - Balance Support: Bilateral upper extremity supported;During functional activity Static Standing - Level of Assistance: 4: Min assist Dynamic Standing Balance Dynamic Standing -  Balance Support: No upper extremity supported;During functional activity Dynamic Standing - Level of Assistance: 4: Min assist Extremity Assessment   RLE Assessment RLE Assessment: Within Functional Limits General Strength Comments: 4+ to 5/5 grossly LLE Assessment LLE Assessment: Within Functional Limits General Strength Comments: 4+ to 5/5 grossly  Care Tool Care Tool Bed Mobility Roll left and right activity   Roll left and right assist level: Minimal Assistance - Patient > 75%    Sit to lying activity   Sit to lying assist level: Moderate Assistance - Patient 50 - 74%    Lying to sitting edge of bed activity   Lying to sitting edge of bed assist level: Moderate Assistance - Patient 50 - 74%     Care Tool Transfers Sit to stand transfer   Sit to stand assist level: Moderate Assistance - Patient 50 - 74%    Chair/bed transfer   Chair/bed  transfer assist level: Minimal Assistance - Patient > 75%     Toilet transfer   Assist Level: Minimal Assistance - Patient > 75%    Car transfer Car transfer activity did not occur: Safety/medical concerns        Care Tool Locomotion Ambulation   Assist level: Minimal Assistance - Patient > 75% Assistive device: Walker-rolling Max distance: 20'  Walk 10 feet activity   Assist level: Minimal Assistance - Patient > 75% Assistive device: Walker-rolling   Walk 50 feet with 2 turns activity Walk 50 feet with 2 turns activity did not occur: Safety/medical concerns      Walk 150 feet activity Walk 150 feet activity did not occur: Safety/medical concerns      Walk 10 feet on uneven surfaces activity Walk 10 feet on uneven surfaces activity did not occur: Safety/medical concerns      Stairs Stair activity did not occur: Safety/medical concerns        Walk up/down 1 step activity Walk up/down 1 step or curb (drop down) activity did not occur: Safety/medical concerns     Walk up/down 4 steps activity did not occuR: Safety/medical concerns  Walk up/down 4 steps activity      Walk up/down 12 steps activity Walk up/down 12 steps activity did not occur: Safety/medical concerns      Pick up small objects from floor Pick up small object from the floor (from standing position) activity did not occur: Safety/medical concerns      Wheelchair Will patient use wheelchair at discharge?: No          Wheel 50 feet with 2 turns activity      Wheel 150 feet activity        Refer to Care Plan for Long Term Goals  SHORT TERM GOAL WEEK 1 PT Short Term Goal 1 (Week 1): Pt will complete least restrictive transfers with min A consistently PT Short Term Goal 2 (Week 1): Pt will ambulate x 75 ft with LRAD and min A PT Short Term Goal 3 (Week 1): Pt will demonstrate recall of WBing precautions with bed mobility and transfers 50% of the time  Recommendations for other services: None    Skilled Therapeutic Intervention Evaluation completed (see details above and below) with education on PT POC and goals and individual treatment initiated with focus on functional transfer and mobility assessment, setting pt up with appropriate equipment to be used during rehab stay, and orientation to rehab unit and schedule. Pt received supine in bed, agreeable to PT evaluation. Pt reports 5/10 pain in L shoulder at  rest, premedicated prior to start of therapy session. Supine to sitting EOB with mod A for some LE management and trunk elevation, cues to adhere to NWBing with LUE. Sit to stand with mod A initially to RW due to posterior bias. Stand pivot transfer to w/c with RW and min A. Pt reports urge to use the bathroom. Toilet transfer with min A with intermittent use of grab bars and RW. Pt is able to continently void while seated on elevated BSC over toilet, see Flowsheet for details. Pt is setup A for pericare, max A to don incontinence brief. Ambulation x 20 ft with RW and min A for balance with increased assist needed during turns due to posterior lean and shuffling steps. Pt requests to return to bed at end of session due to fatigue. Stand pivot transfer w/c to bed with RW and min A. Sit to supine mod A for BLE management. Pt perseverative on the location of her cell phone throughout session, reminded her that her phone was left on her bed during session. Pt left supine in bed with needs in reach, bed alarm in place at end of session.  Mobility Bed Mobility Bed Mobility: Rolling Right;Rolling Left;Supine to Sit;Sit to Supine Rolling Right: Minimal Assistance - Patient > 75% Rolling Left: Minimal Assistance - Patient > 75% Supine to Sit: Moderate Assistance - Patient 50-74% Sit to Supine: Moderate Assistance - Patient 50-74% Transfers Transfers: Sit to Stand;Stand Pivot Transfers Sit to Stand: Moderate Assistance - Patient 50-74% Stand Pivot Transfers: Minimal Assistance - Patient >  75% Stand Pivot Transfer Details: Verbal cues for sequencing;Verbal cues for technique;Verbal cues for precautions/safety;Verbal cues for safe use of DME/AE;Manual facilitation for weight shifting Transfer (Assistive device): Rolling walker Locomotion  Gait Gait Distance (Feet): 20 Feet Assistive device: Rolling walker Gait Gait Pattern: Impaired (shuffling, posterior lean) Gait velocity: decreased Stairs / Additional Locomotion Stairs: No Wheelchair Mobility Wheelchair Mobility: No   Discharge Criteria: Patient will be discharged from PT if patient refuses treatment 3 consecutive times without medical reason, if treatment goals not met, if there is a change in medical status, if patient makes no progress towards goals or if patient is discharged from hospital.  The above assessment, treatment plan, treatment alternatives and goals were discussed and mutually agreed upon: by patient   Excell Seltzer, PT, DPT 05/18/2020, 12:17 PM

## 2020-05-19 ENCOUNTER — Inpatient Hospital Stay (HOSPITAL_COMMUNITY): Payer: Medicare Other

## 2020-05-19 ENCOUNTER — Inpatient Hospital Stay (HOSPITAL_COMMUNITY): Payer: Medicare Other | Admitting: Occupational Therapy

## 2020-05-19 DIAGNOSIS — I5042 Chronic combined systolic (congestive) and diastolic (congestive) heart failure: Secondary | ICD-10-CM

## 2020-05-19 DIAGNOSIS — I10 Essential (primary) hypertension: Secondary | ICD-10-CM

## 2020-05-19 DIAGNOSIS — M898X1 Other specified disorders of bone, shoulder: Secondary | ICD-10-CM

## 2020-05-19 DIAGNOSIS — G20 Parkinson's disease: Secondary | ICD-10-CM

## 2020-05-19 DIAGNOSIS — D649 Anemia, unspecified: Secondary | ICD-10-CM

## 2020-05-19 DIAGNOSIS — S42002D Fracture of unspecified part of left clavicle, subsequent encounter for fracture with routine healing: Principal | ICD-10-CM

## 2020-05-19 LAB — COMPREHENSIVE METABOLIC PANEL
ALT: 12 U/L (ref 0–44)
AST: 41 U/L (ref 15–41)
Albumin: 3.5 g/dL (ref 3.5–5.0)
Alkaline Phosphatase: 69 U/L (ref 38–126)
Anion gap: 10 (ref 5–15)
BUN: 29 mg/dL — ABNORMAL HIGH (ref 8–23)
CO2: 30 mmol/L (ref 22–32)
Calcium: 9.2 mg/dL (ref 8.9–10.3)
Chloride: 100 mmol/L (ref 98–111)
Creatinine, Ser: 1.07 mg/dL — ABNORMAL HIGH (ref 0.44–1.00)
GFR, Estimated: 52 mL/min — ABNORMAL LOW (ref >60)
Glucose, Bld: 106 mg/dL — ABNORMAL HIGH (ref 70–99)
Potassium: 3.5 mmol/L (ref 3.5–5.1)
Sodium: 140 mmol/L (ref 135–145)
Total Bilirubin: 0.6 mg/dL (ref 0.3–1.2)
Total Protein: 6.1 g/dL — ABNORMAL LOW (ref 6.5–8.1)

## 2020-05-19 LAB — CBC WITH DIFFERENTIAL/PLATELET
Abs Immature Granulocytes: 0.04 10*3/uL (ref 0.00–0.07)
Basophils Absolute: 0 10*3/uL (ref 0.0–0.1)
Basophils Relative: 1 %
Eosinophils Absolute: 0.2 10*3/uL (ref 0.0–0.5)
Eosinophils Relative: 3 %
HCT: 34.1 % — ABNORMAL LOW (ref 36.0–46.0)
Hemoglobin: 10.8 g/dL — ABNORMAL LOW (ref 12.0–15.0)
Immature Granulocytes: 1 %
Lymphocytes Relative: 20 %
Lymphs Abs: 1.2 10*3/uL (ref 0.7–4.0)
MCH: 32 pg (ref 26.0–34.0)
MCHC: 31.7 g/dL (ref 30.0–36.0)
MCV: 100.9 fL — ABNORMAL HIGH (ref 80.0–100.0)
Monocytes Absolute: 0.4 10*3/uL (ref 0.1–1.0)
Monocytes Relative: 7 %
Neutro Abs: 3.9 10*3/uL (ref 1.7–7.7)
Neutrophils Relative %: 68 %
Platelets: 356 10*3/uL (ref 150–400)
RBC: 3.38 MIL/uL — ABNORMAL LOW (ref 3.87–5.11)
RDW: 12.8 % (ref 11.5–15.5)
WBC: 5.8 10*3/uL (ref 4.0–10.5)
nRBC: 0 % (ref 0.0–0.2)

## 2020-05-19 NOTE — Progress Notes (Signed)
Physical Therapy Session Note  Patient Details  Name: Elizabeth Mccullough MRN: [id number] Date of Birth: 20-Nov-1940  Today's Date: 05/19/2020 PT Individual Time: 1100-1155 PT Individual Time Calculation (min): 55 min   Short Term Goals: Week 1:  PT Short Term Goal 1 (Week 1): Pt will complete least restrictive transfers with min A consistently PT Short Term Goal 2 (Week 1): Pt will ambulate x 75 ft with LRAD and min A PT Short Term Goal 3 (Week 1): Pt will demonstrate recall of WBing precautions with bed mobility and transfers 50% of the time  Skilled Therapeutic Interventions/Progress Updates:   Received pt supine in bed with RN requesting assist for scooting in bed, PT took over with care. Pt agreeable to therapy with increased time due to delayed responses and staring blankly. Pt denied any pain during session. Pt with low muffled tone of voice. Session with emphasis on functional mobility/transfers, toileting, generalized strengthening, dynamic standing balance/coordination, ambulation, and improved activity tolerance. LUE sling donned throughout session. Pt transferred supine<>sitting EOB with HOB elevated and mod A with cues for LUE NWB precautions. Pt transferred sit<>stand with RW and min A and reported feeling like brief was wet; checked and noted pt with soiled brief. Pt ambulated 32ft x 2 trials with RW and min A to/from bathroom. Noted pt with mild posterior lean in standing, shuffling gait pattern, decreased BLE foot clearance, and poor RW safety awareness requiring cues to increase step length and to remain inside RW. Doffed soiled brief total A and pt able to void in toilet. Pt required total A to don clean brief and pants sitting on commode. Pt transferred sit<>stand min A and able to perform peri-care standing with CGA and increased time. Pt sat in [place] and washed hands at sink with min cues for sequencing and combed hair with supervision. Pt transported to dayroom in [facility] total A and  ambulated 77ft with RW and min A with same cues mentioned above. Noted pt with multiple freezing episodes when turning with RW during session. Pt transferred sit<>stand with RW and performed alternating toe taps to 3in step 2x20 with min A for balance. Pt required mod cues throughout session to maintain LUE NWB precautions. Pt transferred sit<>stand with RW and min A and worked on dynamic standing balance tapping LEs to colored cones based off therapist's cue. Pt correct 7/10 times on trial 1 and 9/10 times on trial 2. Pt transferred sit<>stand without AD x 6 trials with min A. Pt with posterior lean and required cues for anterior weight shifting and to place toes flat on floor as pt leaning backwards and pushing through heels. Stand<>pivot mat<>WC with RW and min A with cues for turning technique. Pt transported back to room in [facility] total A. Concluded session with pt sitting in WC, needs within reach, and seatbelt alarm on. CSW present at bedside.   Therapy Documentation Precautions:  Precautions Precautions: Fall Precaution Comments: Per MD, ok to use walker but encourage pt to avoid pushing through L UE too much Required Braces or Orthoses: Sling Restrictions Weight Bearing Restrictions: Yes LUE Weight Bearing: Non weight bearing Other Position/Activity Restrictions: NWB in sling, but ok to use walker without pushing through L UE too much   Therapy/Group: Individual Therapy Alfonse Alpers PT, DPT   05/19/2020, 7:28 AM

## 2020-05-19 NOTE — Evaluation (Signed)
Speech Language Pathology Assessment and Plan  Patient Details  Name: Elizabeth Mccullough MRN: [id number] Date of Birth: 09-21-1940  SLP Diagnosis: Cognitive Impairments  Rehab Potential: Good ELOS: 10-14 days    Today's Date: 05/20/2020 SLP Individual Time: 7782-4235 SLP Individual Time Calculation (min): 60 min   Hospital Problem: Principal Problem:   Clavicle fracture Active Problems:   Anemia   Essential hypertension   Parkinson's disease ([place])   Chronic combined systolic and diastolic congestive heart failure (HCC)   Clavicle pain   AKI (acute kidney injury) ([place])  Past Medical History:  Past Medical History:  Diagnosis Date  . AAA (abdominal aortic aneurysm) ([place])   . Carotid artery disease ([place]) 01/12/2017   Carotid US 6/18: Bilat < 50%; repeat in 12/2000  . Chronic combined systolic and diastolic CHF (congestive heart failure) ([place]) 03/01/2017   NICM // [place] 8/18: normal coronary arteries /  Echo 8/18: EF 20-25, mod AI, mod MR, PASP 32 // Echo 1/19: EF 55-60, no RWMA, Gr 1 DD, mod AI, mild MR, PASP 32  . Hyperlipidemia   . Parkinson's disease [facility])    Past Surgical History:  Past Surgical History:  Procedure Laterality Date  . BREAST CYST ASPIRATION  1965   Right Breast  . HAMMER TOE SURGERY  04/10/02   Left Toe  . KNEE SURGERY Right 10/17/15   meniscus repair  . NASAL SEPTUM SURGERY  1980  . RIGHT/LEFT HEART CATH AND CORONARY ANGIOGRAPHY N/A 03/03/2017   Procedure: RIGHT/LEFT HEART CATH AND CORONARY ANGIOGRAPHY;  Surgeon: Nelva Bush, MD;  Location: [place] [facility];  Service: Cardiovascular;  Laterality: N/A;  . THORACIC AORTOGRAM N/A 03/03/2017   Procedure: Thoracic Aortogram;  Surgeon: Nelva Bush, MD;  Location: [place] [facility];  Service: Cardiovascular;  Laterality: N/A;    Assessment / Plan / Recommendation Clinical Impression Shaughnessyis an 79 year old right-handed female with history of hypertension, Parkinson's disease maintained on  Sinemet, 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 Per chart review patient resides independent living facility [place] since March 2021. Ambulates with a rolling walker but does endorse frequent falls. She was able to bathe dress feet and told herself and goes to the dining hall for meals.. Daughter and family plan to hire assistance as needed. Presented 05/13/2020 after falling 3 times on day of presentation. Denied loss of consciousness. Admission chemistries glucose 100, troponin 26>29, hemoglobin 10.6, urinalysis negative nitrite. Cranial CT scan as well as CT cervical spine showed no acute intracranial pathology and no acute traumatic cervical spine pathology. X-rays right knee no acute osseous abnormality. Findings of comminuted displaced left clavicle fracture. Follow-up orthopedic services Dr. Mardelle Matte in regards to left clavicle fracture also right knee contusion with conservative care and no surgical intervention. Nonweightbearing left upper extremity with shoulder sling. Cardiology service follow-up for history of CHF with latest chest x-ray showing probable left pleural effusion and left lung base atelectasis and echocardiogram completed showing ejection fraction 30 to 36% grade 1 diastolic dysfunction, moderate hypokinesis LV... Patient currently remains on Lasix 40 mg daily as well as lisinopril with Zebeta 2.5 mg daily. Subcutaneous heparin added for DVT prophylaxis.  Pt presents with moderate cognitive linguistic impairments, likely progression of baseline Parkinson's disease further impacted by recent falls. Chart review of brain imaging noted no acute injuries, but family (SLP communicated with son, Timmothy Sours via phone) and pt support  exacerbation of deficits in cognition and speech over the past  several months along with numerous falls. During today's evaluation pt demonstrated primary deficit in lethargy and reduced sustained attention, impacting orientation to time, basic problem solving, emergent awareness, short term recall and reduced vocal intensity. Pt scored 17 out 30 (n=>27) on formal cognitive linguistic assessment SLUMS. Pt demonstrated only mild deficits in short term recall (recalling 5/5 words but missed x1 questions following a short story.) Pt demonstrated inconsistent ability to follow command, her eyes remained closed most of the session, but demonstrated intellectual awareness of deficits and directed care during transferring to and from the bathroom via [place]. Pt requires increase encouragement to participate in evaluation and Timmothy Sours mentioned pt "feeling constantly tried and wanted everyone to do things for her" verse attempting to be independent. Speech intelligibility was reduced to 90% due to inconsistent vocal intensity at the phrase/sentence level, in which pt was able to increase with cuing. Pt reports no impairments in swallow function. Pt would benefit from skilled ST services x1-3 a week in order to facilitate carryover of strategies taught in OT/PT treatments, to increase safety awareness/decrease fall risk and increase independence since she was living at ILF prior to admission. Pt will require supervision at discharge with TBD ST services recommended.     Skilled Therapeutic Interventions          Skilled ST services focused on cognitive skills. SLP facilitated administration of cognitive linguistic formal assessment and provided education of results. SLP and pt collaborated to set goals for cognitive linguistic needs during length of stay. SLP communicated with pt's son , Timmothy Sours, via phone who expressed progression of decline but exacerbation of deficits since hospitalization.  All questions answered to satisfaction.  Pt was left in room with call bell within reach and bed alarm  set. SLP recommends to continue skilled services.   SLP Assessment  Patient will need skilled Speech Lanaguage Pathology Services during CIR admission    Recommendations  Recommendations for Other Services: Neuropsych consult;Therapeutic Recreation consult Therapeutic Recreation Interventions: Other (comment) Patient destination: Home (ILF) Follow up Recommendations: Other (comment) (TBD) Equipment Recommended: None recommended by SLP    SLP Frequency 1 to 3 out of 7 days   SLP Duration  SLP Intensity  SLP Treatment/Interventions 10-14 days  Minumum of 1-2 x/day, 30 to 90 minutes  Cognitive remediation/compensation;Cueing hierarchy;Functional tasks;Internal/external aids;Patient/family education    Pain Pain Assessment Pain Scale: 0-10 Pain Score: 0-No pain  Prior Functioning    SLP Evaluation Cognition Overall Cognitive Status: Impaired/Different from baseline (pt's son, Timmothy Sours, supported exacerbation of deficits) Arousal/Alertness: Lethargic Orientation Level: Oriented to person;Oriented to situation;Oriented to place;Disoriented to time Attention: Sustained Focused Attention: Appears intact Sustained Attention: Impaired Sustained Attention Impairment: Functional basic;Verbal basic Memory: Impaired Memory Impairment: Decreased recall of new information;Decreased short term memory (very mild) Awareness: Impaired Awareness Impairment: Emergent impairment Problem Solving: Impaired Problem Solving Impairment: Verbal basic;Functional basic Safety/Judgment: Impaired  Comprehension Auditory Comprehension Overall Auditory Comprehension: Impaired Yes/No Questions: Within Functional Limits Commands: Impaired One Step Basic Commands: 75-100% accurate (inconsisent) Conversation: Simple Interfering Components: Attention;Processing speed EffectiveTechniques: Extra processing time Visual Recognition/Discrimination Discrimination: Within Function Limits Reading  Comprehension Reading Status: Not tested Expression Expression Primary Mode of Expression: Verbal Verbal Expression Overall Verbal Expression: Appears within functional limits for tasks assessed Oral Motor Oral Motor/Sensory Function Overall Oral Motor/Sensory Function: Within functional limits Motor Speech Overall Motor Speech: Appears within functional limits for tasks assessed  Care Tool Care Tool Cognition Expression of Ideas  and Wants Expression of Ideas and Wants: Some difficulty - exhibits some difficulty with expressing needs and ideas (e.g, some words or finishing thoughts) or speech is not clear   Understanding Verbal and Non-Verbal Content Understanding Verbal and Non-Verbal Content: Usually understands - understands most conversations, but misses some part/intent of message. Requires cues at times to understand   Memory/Recall Ability *first 3 days only      Short Term Goals: Week 1: SLP Short Term Goal 1 (Week 1): Pt will demonstrate sustained attention and keeping eyes open in 5 minute intervals with min A verbal cues for redirection. SLP Short Term Goal 2 (Week 1): Pt will demonstrate basic problem solving in ADL tasks with min A verbal cues. SLP Short Term Goal 3 (Week 1): Pt will verbalize and demonstrate safety awareness during functional tasks with min A verbal cues. SLP Short Term Goal 4 (Week 1): Pt will increase vocal intensity at phrase level for 90% intelligibility with min A verbal cues.  Refer to Care Plan for Long Term Goals  Recommendations for other services: Neuropsych and Therapeutic Recreation  Other hobby  Discharge Criteria: Patient will be discharged from SLP if patient refuses treatment 3 consecutive times without medical reason, if treatment goals not met, if there is a change in medical status, if patient makes no progress towards goals or if patient is discharged from hospital.  The above assessment, treatment plan, treatment alternatives and  goals were discussed and mutually agreed upon: by patient and by family  [place]  [facility] 05/20/2020, 11:18 AM

## 2020-05-19 NOTE — Progress Notes (Signed)
Inpatient [facility] Individual Statement of Services  Patient Name:  Elizabeth Mccullough  Date:  05/19/2020  Welcome to the [facility].  Our goal is to provide you with an individualized program based on your diagnosis and situation, designed to meet your specific needs.  With this comprehensive rehabilitation program, you will be expected to participate in at least 3 hours of rehabilitation therapies Monday-Friday, with modified therapy programming on the weekends.  Your rehabilitation program will include the following services:  Physical Therapy (PT), Occupational Therapy (OT), Speech Therapy (ST), 24 hour per day rehabilitation nursing, Neuropsychology, Care Coordinator, Rehabilitation Medicine, Nutrition Services and Pharmacy Services  Weekly team conferences will be held on Wednesday to discuss your progress.  Your Inpatient Rehabilitation Care Coordinator will talk with you frequently to get your input and to update you on team discussions.  Team conferences with you and your family in attendance may also be held.  Expected length of stay: 11-13 days  Overall anticipated outcome: supervision with cueing and min for B & D due to lack of movement in her arm & WB issues  Depending on your progress and recovery, your program may change. Your Inpatient Rehabilitation Care Coordinator will coordinate services and will keep you informed of any changes. Your Inpatient Rehabilitation Care Coordinator's name and contact numbers are listed  below.  The following services may also be recommended but are not provided by the [facility]:   [place] will be made to provide these services after discharge if needed.  Arrangements include referral to agencies that provide these services.  Your insurance has been verified to be:  [employer] Your primary doctor is:   Roma Schanz  Pertinent information will be shared with your doctor and your insurance company.  Inpatient Rehabilitation Care Coordinator:  Ovidio Kin, [place] or Emilia Beck  Information discussed with and copy given to patient by: Elease Hashimoto, 05/19/2020, 1:27 PM

## 2020-05-19 NOTE — Plan of Care (Signed)
  Problem: Consults Goal: RH GENERAL PATIENT EDUCATION Description: See Patient Education module for education specifics. Outcome: Progressing   Problem: RH BOWEL ELIMINATION Goal: RH STG MANAGE BOWEL WITH ASSISTANCE Description: STG Manage Bowel with min Assistance. Outcome: Progressing Goal: RH STG MANAGE BOWEL W/MEDICATION W/ASSISTANCE Description: STG Manage Bowel with Medication with min Assistance. Outcome: Progressing   Problem: RH BLADDER ELIMINATION Goal: RH STG MANAGE BLADDER WITH ASSISTANCE Description: STG Manage Bladder With min Assistance Outcome: Progressing   Problem: RH SKIN INTEGRITY Goal: RH STG SKIN FREE OF INFECTION/BREAKDOWN Description: Skin to remain free of breakdown with min assist Outcome: Progressing Goal: RH STG MAINTAIN SKIN INTEGRITY WITH ASSISTANCE Description: STG Maintain Skin Integrity With min Assistance. Outcome: Progressing   Problem: RH SAFETY Goal: RH STG ADHERE TO SAFETY PRECAUTIONS W/ASSISTANCE/DEVICE Description: STG Adhere to Safety Precautions With min Assistance/Device. Outcome: Progressing   Problem: RH PAIN MANAGEMENT Goal: RH STG PAIN MANAGED AT OR BELOW PT'S PAIN GOAL Description: Less than 3 out of 10 Outcome: Progressing   Problem: RH KNOWLEDGE DEFICIT GENERAL Goal: RH STG INCREASE KNOWLEDGE OF SELF CARE AFTER HOSPITALIZATION Outcome: Progressing

## 2020-05-19 NOTE — Progress Notes (Signed)
Occupational Therapy Session Note  Patient Details  Name: Elizabeth Mccullough MRN: [id number] Date of Birth: April 07, 1941  Today's Date: 05/19/2020 OT Individual Time: 8088-1103 OT Individual Time Calculation (min): 54 min    Short Term Goals: Week 1:  OT Short Term Goal 1 (Week 1): Pt will complete 2/3 components of donning pants OT Short Term Goal 2 (Week 1): Pt will complete 1 grooming task while standing at the sink with no more than Min balance assistance OT Short Term Goal 3 (Week 1): Pt will complete shower transfer with LRAD and Min A  Skilled Therapeutic Interventions/Progress Updates:    Treatment session with focus on self-care retraining with functional transfers, sit > stand, and adherence to NWB through LUE during functional mobility.  Pt received on bedpan upon arrival.  Therapist assisted pt with rolling Rt and Lt to remove bedpan and complete hygiene.  Pt reports pain with rolling and guarding of LUE during mobility.  Completed sidelying to sitting mod assist to come to sitting EOB.  Therapist encouraged pt to attempt LB dressing with donning personal incontinence pullups, requiring max assist due to decreased initiation and participation.  Pt with frequent c/o dizziness and nausea during therapy session, BP 139/58 and RN aware.  RN reports pt in bed a lot and may just need to acclimate to OOB.  Completed stand pivot transfer min assist bed > w/c.  Engaged in [place] bathing and dressing at sink side with min assist for washing back and mod assist for UB dressing due to nausea and continuously asking to return to bed.  Completed LB dressing with therapist assisting to thread BLE in to pants, pt then able to stand CGA and pull pants over hips with close supervision.  Pt refused any oral care this session.  Returned to bed min assist stand pivot and transitioned to semi-reclined.  Pt remained semi-reclined in bed with all needs in reach.  Therapy Documentation Precautions:   Precautions Precautions: Fall Precaution Comments: Per MD, ok to use walker but encourage pt to avoid pushing through L UE too much Required Braces or Orthoses: Sling Restrictions Weight Bearing Restrictions: Yes LUE Weight Bearing: Non weight bearing Other Position/Activity Restrictions: NWB in sling, but ok to use walker without pushing through L UE too much General:   Vital Signs:   Pain: Pain Assessment Pain Scale: 0-10 Pain Score: 0-No pain ADL: ADL Eating: Set up Grooming: Supervision/safety Where Assessed-Grooming: Sitting at sink Upper Body Bathing: Supervision/safety Where Assessed-Upper Body Bathing: Other (Comment) (sitting on elevated toilet) Lower Body Bathing: Moderate assistance Where Assessed-Lower Body Bathing: Other (Comment) (sit<stand from elevated toilet) Upper Body Dressing: Contact guard Where Assessed-Upper Body Dressing: Other (Comment) (sitting on toilet and standing in front of toilet) Lower Body Dressing: Maximal assistance Where Assessed-Lower Body Dressing: Other (Comment) (sit<stand from elevated toilet) Toileting: Maximal assistance Where Assessed-Toileting: Glass blower/designer: Minimal Print production planner Method: Ambulating (RW) Science writer: Raised toilet seat, Grab bars Tub/Shower Transfer: Not assessed Vision   Perception    Praxis   Exercises:   Other Treatments:     Therapy/Group: Individual Therapy  [name] 05/19/2020, 11:13 AM

## 2020-05-19 NOTE — Progress Notes (Signed)
Patient Details  Name: Elizabeth Mccullough MRN: [id number] Date of Birth: 07/22/1940  Today's Date: 05/19/2020  Hospital Problems: Principal Problem:   Clavicle fracture Active Problems:   Anemia   Essential hypertension   Parkinson's disease ([place])   Chronic combined systolic and diastolic congestive heart failure ([place])   Clavicle pain  Past Medical History:  Past Medical History:  Diagnosis Date  . AAA (abdominal aortic aneurysm) ([place])   . Carotid artery disease ([place]) 01/12/2017   Carotid US 6/18: Bilat < 50%; repeat in 12/2000  . Chronic combined systolic and diastolic CHF (congestive heart failure) ([place]) 03/01/2017   NICM // [place] 8/18: normal coronary arteries /  Echo 8/18: EF 20-25, mod AI, mod MR, PASP 32 // Echo 1/19: EF 55-60, no RWMA, Gr 1 DD, mod AI, mild MR, PASP 32  . Hyperlipidemia   . Parkinson's disease [facility])    Past Surgical History:  Past Surgical History:  Procedure Laterality Date  . BREAST CYST ASPIRATION  1965   Right Breast  . HAMMER TOE SURGERY  04/10/02   Left Toe  . KNEE SURGERY Right 10/17/15   meniscus repair  . NASAL SEPTUM SURGERY  1980  . RIGHT/LEFT HEART CATH AND CORONARY ANGIOGRAPHY N/A 03/03/2017   Procedure: RIGHT/LEFT HEART CATH AND CORONARY ANGIOGRAPHY;  Surgeon: Nelva Bush, MD;  Location: [place] [facility];  Service: Cardiovascular;  Laterality: N/A;  . THORACIC AORTOGRAM N/A 03/03/2017   Procedure: Thoracic Aortogram;  Surgeon: Nelva Bush, MD;  Location: [place] [facility];  Service: Cardiovascular;  Laterality: N/A;   Social History:  reports that she has never smoked. She has never used smokeless tobacco. She reports current alcohol use. She reports that she does not use drugs.  Family / Support Systems Marital Status: Widow/Widower Patient Roles: Parent Children: Elizabeth Mccullough-daughter [phone]  Elizabeth Mccullough-daughter [phone] Other Supports: Elizabeth Mccullough-son [phone] Anticipated Caregiver: Facility and hired assist through Teaching laboratory technician through [employer] Ability/Limitations of Caregiver: none-family prepared to hire assist at [employer] Availability: 24/7 Family Dynamics: Close with three children who are all involved and supportive. Pt recently moved to [place] in March 2021 and likes it. Gives children piece of mind having someone around her.  Social History Preferred language: English Religion: Catholic Cultural Background: No issues Education: HS Read: Yes Write: Yes Employment Status: Retired Public relations account executive Issues: No issues Guardian/Conservator: None-according to MD pt is capable of making her own decisions while here, but will include the children due to at times pt is not fully oriented. Will ask neruo-psych to see while here due to loss of independence   Abuse/Neglect Abuse/Neglect Assessment Can Be Completed: Yes Physical Abuse: Denies Verbal Abuse: Denies Sexual Abuse: Denies Exploitation of patient/patient's resources: Denies Self-Neglect: Denies  Emotional Status Pt's affect, behavior and adjustment status: Pt is tired from therapies and is ready for a rest. She feels it went well. She wants to be as independent as possible and be safer with her fractured clavicle. She has had many falls recently and knows how lucky she has been and knows her luck could end and do more serious fractures. Recent Psychosocial Issues: Parkinson's, CHF and frequent falls at home Psychiatric History: No history deferred depression screen due to exhausted from therapy, will ask neuro-psych to see for chronic illness and coping issues. Substance Abuse History: No issues  Patient / Family Perceptions, Expectations & Goals Pt/Family understanding of illness & functional limitations: Pt and Elizabeth Mccullough can explain her illness and fracture, both feel she  could have been hurt worse and she needs to be as safe as possible at home. Both talk with the MD when rounding and feel they have a good  understanding of her treatment plan going forward. Premorbid pt/family roles/activities: Mom, grandmother, retiree, church member, etc Anticipated changes in roles/activities/participation: resume Pt/family expectations/goals: Pt states: " I want to not fall as much it's not like I try to." [place] states: " We hope she can get mobile and safer when she is ambulating."  [employer]: Other (Comment) (Resident of [employer]) Premorbid Home Care/DME Agencies: Other (Comment) ([facility]-getting PT,OT,SP at facility) Transportation available at discharge: Facility and family Resource referrals recommended: Neuropsychology  Discharge Planning Living Arrangements: Other (Comment) (ILF) Support Systems: Children Type of Residence: Independent Living Insurance Resources: Multimedia programmer (specify) Forensic psychologist Medicare) Financial Resources: Radio broadcast assistant Screen Referred: No Living Expenses: Rent Money Management: Family Does the patient have any problems obtaining your medications?: No Home Management: Facility provides cleaning and meals-goes to dining room Patient/Family Preliminary Plans: Return back to [place] with family supplementing with hired assist through the private duty agency they go through. Made aware goals are supervision and min for B & D and est LOS 10-12 days. Care Coordinator Anticipated Follow Up Needs: HH/OP, ALF/IL  Clinical Impression Pleasant tired patient who wants to do well and reduce her falls at the ILF. It is difficult with her arm in a sling and trying to use a rolling walker. Her three children are involved and supportive and will hire assist for additional assist at the [place] when she is discharged. Along with getting daily PT,OT and SP according to daughter. Will ask neuro-psych to see while here and address coping. Work on discharge needs.  Elizabeth Mccullough 05/19/2020, 1:25 PM

## 2020-05-19 NOTE — Progress Notes (Signed)
Elizabeth Staggers, MD  Physician  Physical Medicine and Rehabilitation  PMR Pre-admission     Signed  Date of Service:  05/15/2020  2:06 PM      Related encounter: ED to Hosp-Admission (Discharged) from 05/13/2020 in [facility]       Show:Clear all _0 Manual_1 Template_2 Copied  Added by: _3 Cristina Gong, RN_4 Elizabeth Staggers, MD  _5 Hover for details PMR Admission Coordinator Pre-Admission Assessment   Patient: Elizabeth Mccullough is an 79 y.o., female MRN: [id number] DOB: 06-19-1941 Height: _6  (157.5 cm) Weight: 59 kg   Insurance Information HMO: yes    PPO:      PCP:      IPA:      80/20:      OTHER:  PRIMARY: Blue Medicare      Policy#: [id number]      Subscriber: pt CM Name: Elizabeth Mccullough      Phone#: [phone]     Fax#: [phone] Pre-Cert#: tba approved for 7 days      Employer:  Benefits:  Phone #: [phone]     Name: 10/28 Eff. Date: 07/20/2019     Deduct: none      Out of Pocket Max: $4400      Life Max: none CIR: $335 co pay per admission      SNF: no copay per day days 1 until 20: $184 co pay per day days 21 until 60; no copay days 61 until 100 Outpatient: $40 per visit     Co-Pay: visits limited per medical neccesity Home Health: 100%      Co-Pay: visits per medical neccesity DME: 80%     Co-Pay: 20% Providers: in network  SECONDARY:       Policy#:      Phone#:    Development worker, community:       Phone#:    The Engineer, [employer]" for patients in Inpatient Rehabilitation Facilities with attached "Privacy Act [place] Records" was provided and verbally reviewed with: Family   Emergency Contact Information         Contact Information     Name Relation Home Work Elizabeth Mccullough [place] [phone]   [phone]    Elizabeth Mccullough [phone]   [phone]    Elizabeth Mccullough Daughter [phone]   [phone]         Current Medical History    Patient Admitting Diagnosis: falls, Parkinson's   History of Present Illness: Elizabeth Mccullough is an 79 year old right-handed female with history of hypertension, Parkinson's disease maintained on Sinemet, 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  Per chart review patient resides independent living facility [facility] since March 2021.  Ambulates with a rolling walker but does endorse frequent falls.  She was able to bathe dress feet and told herself and goes to the dining hall for meals..  Daughter and family plan to hire assistance as needed.  Presented 05/13/2020 after falling 3 times on day of presentation.  Denied loss of consciousness.  Admission chemistries glucose 100, troponin 26>29, hemoglobin 10.6, urinalysis negative nitrite.  Cranial CT scan as well as CT cervical spine showed no acute intracranial pathology and no acute traumatic cervical spine pathology.  X-rays right knee no acute osseous abnormality.  Findings of comminuted displaced left clavicle fracture.  Follow-up orthopedic services Dr. Mardelle Matte in  regards to left clavicle fracture also right knee contusion with conservative care and no surgical intervention.  Nonweightbearing left upper extremity with shoulder sling.  Cardiology service follow-up for history of CHF with latest chest x-ray showing probable left pleural effusion and left lung base atelectasis and echocardiogram completed showing ejection fraction 30 to 15% grade 1 diastolic dysfunction, moderate hypokinesis LV...  Patient currently remains on Lasix 40 mg daily as well as lisinopril with Zebeta 2.5 mg daily.  Subcutaneous heparin added for DVT prophylaxis.   Patient's medical record from [facility]  has been reviewed by the rehabilitation admission coordinator and physician.   Past  Medical History      Past Medical History:  Diagnosis Date  . AAA (abdominal aortic aneurysm) ([place])    . Carotid artery disease ([place]) 01/12/2017    Carotid US 6/18: Bilat < 50%; repeat in 12/2000  . Chronic combined systolic and diastolic CHF (congestive heart failure) ([place]) 03/01/2017    NICM // [place] 8/18: normal coronary arteries /  Echo 8/18: EF 20-25, mod AI, mod MR, PASP 32 // Echo 1/19: EF 55-60, no RWMA, Gr 1 DD, mod AI, mild MR, PASP 32  . Hyperlipidemia    . Parkinson's disease ([place])        Family History   family history includes Breast cancer in an other family member; Cancer in her sister; Cancer (age of onset: 38) in her sister; Colon cancer in an other family member; Diabetes in her daughter and sister; Healthy in her daughter; Heart disease in her mother; Hyperlipidemia in her brother, sister, and sister; Hypertension in her brother; Prostate cancer in her son; Stomach cancer in her daughter; Stroke in her brother.   Prior Rehab/Hospitalizations Has the patient had prior rehab or hospitalizations prior to admission? Yes   Has the patient had major surgery during 100 days prior to admission? No               Current Medications   Current Facility-Administered Medications:  .  acetaminophen (TYLENOL) tablet 650 mg, 650 mg, Oral, Q6H PRN, [place] Eric J, DO .  bisoprolol (ZEBETA) tablet 2.5 mg, 2.5 mg, Oral, Daily, Cox, Amy N, DO, 2.5 mg at 05/16/20 0932 .  Bromfenac Sodium 0.07 % SOLN 1 drop, 1 drop, Right Eye, Daily, [place] Donnamarie Poag, DO, 1 drop at 05/16/20 0936 .  Carbidopa-Levodopa ER (SINEMET CR) 25-100 MG tablet controlled release 2 tablet, 2 tablet, Oral, TID, Cox, Amy N, DO, 2 tablet at 05/16/20 0931 .  furosemide (LASIX) tablet 40 mg, 40 mg, Oral, Daily, Hilty, Nadean Corwin, MD, 40 mg at 05/16/20 0931 .  heparin injection 5,000 Units, 5,000 Units, Subcutaneous, Q8H, Cox, Amy N, DO, 5,000 Units at 05/16/20 1321 .  HYDROcodone-acetaminophen (NORCO/VICODIN) 5-325 MG per  tablet 1 tablet, 1 tablet, Oral, Q6H PRN, [place] Eric J, DO .  [START ON 05/17/2020] lisinopril (ZESTRIL) tablet 10 mg, 10 mg, Oral, Daily, Hilty, Nadean Corwin, MD .  lisinopril (ZESTRIL) tablet 5 mg, 5 mg, Oral, Once, Hilty, Nadean Corwin, MD .  LORazepam (ATIVAN) tablet 0.5 mg, 0.5 mg, Oral, Q6H PRN, [place] Donnamarie Poag, DO, 0.5 mg at 05/15/20 1628 .  moxifloxacin (VIGAMOX) 0.5 % ophthalmic solution 1 drop, 1 drop, Right Eye, TID, [place] Donnamarie Poag, DO, 1 drop at 05/16/20 0937 .  pravastatin (PRAVACHOL) tablet 40 mg, 40 mg, Oral, q1800, Cox, Amy N, DO, 40 mg at 05/15/20 1628 .  prednisoLONE acetate (PRED FORTE) 1 % ophthalmic suspension 1 drop,  1 drop, Right Eye, TID, 1 drop at 05/16/20 0937 **FOLLOWED BY** [START ON 05/22/2020] prednisoLONE acetate (PRED FORTE) 1 % ophthalmic suspension 1 drop, 1 drop, Right Eye, BID **FOLLOWED BY** [START ON 05/29/2020] prednisoLONE acetate (PRED FORTE) 1 % ophthalmic suspension 1 drop, 1 drop, Right Eye, Daily, [place] Eric J, DO .  sertraline (ZOLOFT) tablet 25 mg, 25 mg, Oral, Daily, Cox, Amy N, DO, 25 mg at 05/16/20 0932   Patients Current Diet:     Diet Order                      Diet Heart Room service appropriate? Yes; Fluid consistency: Thin  Diet effective now                      Precautions / Restrictions Precautions Precautions: Fall Precaution Comments: Per MD, ok to use walker but encourage pt to avoid pushing through L UE too much Restrictions Weight Bearing Restrictions: Yes LUE Weight Bearing: Non weight bearing Other Position/Activity Restrictions: NWB in sling, but ok to use walker without pushing through L UE too much    Has the patient had 2 or more falls or a fall with injury in the past year? Yes   Prior Activity Level Limited Community (1-2x/wk): Mod I with RW   Prior Functional Level Self Care: Did the patient need help bathing, dressing, using the toilet or eating? Independent   Indoor Mobility: Did the patient need assistance  with walking from room to room (with or without device)? Independent   Stairs: Did the patient need assistance with internal or external stairs (with or without device)? Needed some help   Functional Cognition: Did the patient need help planning regular tasks such as shopping or remembering to take medications? Needed some help   Home Assistive Devices / [place] Devices/Equipment: Gilford Rile (specify type) (front wheel) Home Equipment: Walker - 4 wheels, Grab bars - toilet, Grab bars - tub/shower, Shower seat, Bedside commode   Prior Device Use: Indicate devices/aids used by the patient prior to current illness, exacerbation or injury? Walker   Current Functional Level Cognition   Overall Cognitive Status: History of cognitive impairments - at baseline Difficult to assess due to: Level of arousal Current Attention Level: Sustained Orientation Level: Oriented to person, Oriented to place, Oriented to time, Disoriented to situation (orientation changes) Following Commands: Follows one step commands with increased time Safety/Judgement: Decreased awareness of safety, Decreased awareness of deficits General Comments: difficulty following multi-step commands, does not follow directions unless completing stopping task    Extremity Assessment (includes Sensation/Coordination)   Upper Extremity Assessment: LUE deficits/detail LUE Deficits / Details: clavicle fx with bruising around shoulder. Hand, wrist and elbow ROM WFL LUE: Unable to fully assess due to immobilization LUE Sensation: WNL LUE Coordination: decreased fine motor  Lower Extremity Assessment: Defer to PT evaluation     ADLs   Overall ADL's : Needs assistance/impaired Eating/Feeding: Set up, Sitting Grooming: Moderate assistance, Sitting, Oral care Grooming Details (indicate cue type and reason): Mod A overall for brushing teeth sitting EOB. Great difficulty problem solving, required max cues for sequencing. Use of  sling helped deter pt from using L UE Upper Body Bathing: Moderate assistance, Sitting, Cueing for UE precautions Lower Body Bathing: Maximal assistance, Sit to/from stand Upper Body Dressing : Moderate assistance, Cueing for UE precautions, Sitting Upper Body Dressing Details (indicate cue type and reason): Mod A for sling mgmt  Lower Body Dressing: Maximal  assistance, Sit to/from stand, Sitting/lateral leans Toilet Transfer: Minimal assistance, +2 for safety/equipment, +2 for physical assistance, Ambulation, BSC, Regular Toilet Toilet Transfer Details (indicate cue type and reason): Min A x 2 to maintain balance, shuffling gait noted. Attempted use of IV pole, 1 and 2 person handheld assist, cueing to avoid L UE use Toileting- Clothing Manipulation and Hygiene: Moderate assistance, Sitting/lateral lean, Sit to/from stand Toileting - Clothing Manipulation Details (indicate cue type and reason): Mod A overall. pt able to get toilet paper, assist with clothing mgmt, but difficulty attending to L UE precaution Functional mobility during ADLs: Minimal assistance, +2 for physical assistance, +2 for safety/equipment, Rolling walker, Cueing for sequencing, Cueing for safety General ADL Comments: Pt with improved balance with RW (cleared by MD), noted with deficits in problem solving ADLs today     Mobility   Overal bed mobility: Needs Assistance Bed Mobility: Supine to Sit Supine to sit: Mod assist, HOB elevated Sit to supine: Max assist General bed mobility comments: modA to scoot forward to EOB     Transfers   Overall transfer level: Needs assistance Equipment used: Rolling walker (2 wheeled) Transfers: Sit to/from Stand Sit to Stand: Min assist Stand pivot transfers: Min assist General transfer comment: minA for sit to stand, cues for using R UE to assist to standing rather than L      Ambulation / Gait / Stairs / Wheelchair Mobility   Ambulation/Gait Ambulation/Gait assistance: Herbalist (Feet): 8 Feet Assistive device: Rolling walker (2 wheeled) Gait Pattern/deviations: Step-to pattern, Decreased stride length, Shuffle, Wide base of support, Staggering right, Staggering left General Gait Details: during bwd walking, patient began to demo shuffle gait with posterior LOB which required minA to recover. Decreased safety awareness with patient unaware of posterior LOB and uncontrolled descent into chair Gait velocity: reduced Gait velocity interpretation: <1.8 ft/sec, indicate of risk for recurrent falls     Posture / Balance Dynamic Sitting Balance Sitting balance - Comments: fair sitting balance EOB during ADLs Balance Overall balance assessment: History of Falls, Needs assistance Sitting-balance support: Feet supported Sitting balance-Leahy Scale: Fair Sitting balance - Comments: fair sitting balance EOB during ADLs Postural control: Posterior lean Standing balance support: Bilateral upper extremity supported, During functional activity Standing balance-Leahy Scale: Poor Standing balance comment: improved balance standing with walker at bedside     Special needs/care consideration Wears depends for urinary incontinence pta; has frequent UTIS Frequent falls pta with injuries AT ILF , caregiver support is al [employer] and family can hire caregivers to provide supervision as needed    Previous Home Environment  Living Arrangements: Alone, Other (Comment) ([facility]. Has onsite emergen)  Lives With: Alone, Other (Comment) ([place]) Available Help at Discharge: Other (Comment) (daughter states family will hire assistance to be provided a) Type of Home: [place]: One level Home Access: Level entry Bathroom Shower/Tub: Multimedia programmer: Handicapped height [place] Accessibility: Yes How Accessible: Accessible via [place]:  (receivd PT, OT and SLP  at [place]) Additional Comments: pt has staff from rehab observing her self care to give her feedback   Discharge [place] for Discharge Living Setting: Other ([place]) ([place] since March 2021) Type of Home at Discharge: [place] Name at Discharge: [place] Discharge Home Layout: One level Discharge Home Access: Level entry Discharge Bathroom Shower/Tub: Walk-in shower Discharge Bathroom Toilet: Handicapped height Discharge  Bathroom Accessibility: Yes How Accessible: Accessible via walker Does the patient have any problems obtaining your medications?: No   Social/Family/Support Systems Contact Information: daughters and son Anticipated Caregiver: family to hire caregivers to be added at [place]: see above Caregiver Availability: 24/7 Discharge Plan Discussed with Primary Caregiver: Yes Is Caregiver In Agreement with Plan?: Yes Does Caregiver/Family have Issues with Lodging/Transportation while Pt is in Rehab?: No   Goals Patient/Family Goal for Rehab: supervision PT, supervision ot min OT, supervision SLP Expected length of stay: ELOS 2 weeks Pt/Family Agrees to Admission and willing to participate: Yes Program Orientation Provided & Reviewed with Pt/Caregiver Including Roles  & Responsibilities: Yes   Decrease burden of Care through IP rehab admission: n/a   Possible need for SNF placement upon discharge: yes if she does not reach level to return to ILF with hired caregivers for supervision   Patient Condition: I have reviewed medical records from [facility] , spoken with CM, and patient and daughter. I met with patient at the bedside for inpatient rehabilitation assessment.  Patient will benefit from ongoing PT, OT and SLP, can actively participate in 3 hours of therapy a day 5 days of the week, and can make measurable gains during the admission.  Patient will also  benefit from the coordinated team approach during an Inpatient Acute Rehabilitation admission.  The patient will receive intensive therapy as well as Rehabilitation physician, nursing, social worker, and care management interventions.  Due to bladder management, bowel management, safety, skin/wound care, disease management, medication administration, pain management and patient education the patient requires 24 hour a day rehabilitation nursing.  The patient is currently mod assist with mobility and basic ADLs.  Discharge setting and therapy post discharge at home with home health at [place] is anticipated.  Patient has agreed to participate in the Acute Inpatient Rehabilitation Program and will admit Saturday 05/17/2020.   Preadmission Screen Completed By:  Cleatrice Burke, 05/16/2020 4:27 PM ______________________________________________________________________   Discussed status with Dr. Naaman Plummer  on  05/16/2020 at  1600 and received approval for admission Saturday 05/17/2020.   Admission Coordinator:  Cleatrice Burke, RN, time 3382 Date  05/16/2020    Assessment/Plan: Diagnosis: left clavicle fx, gait disorder 1. Does the need for close, 24 hr/day Medical supervision in concert with the patient's rehab needs make it unreasonable for this patient to be served in a less intensive setting? Yes 2. Co-Morbidities requiring supervision/potential complications: PD, HTN, csdCHF 3. Due to bladder management, bowel management, safety, skin/wound care, disease management, medication administration, pain management and patient education, does the patient require 24 hr/day rehab nursing? Yes 4. Does the patient require coordinated care of a physician, rehab nurse, PT, OT, and SLP to address physical and functional deficits in the context of the above medical diagnosis(es)? Yes Addressing deficits in the following areas: balance, endurance, locomotion, strength, transferring, bowel/bladder control,  bathing, dressing, feeding, grooming, toileting, cognition, speech and psychosocial support 5. Can the patient actively participate in an intensive therapy program of at least 3 hrs of therapy 5 days a week? Yes 6. The potential for patient to make measurable gains while on inpatient rehab is excellent 7. Anticipated functional outcomes upon discharge from inpatient rehab: supervision PT, supervision and min assist OT, supervision SLP 8. Estimated rehab length of stay to reach the above functional goals is: 14 days 9. Anticipated discharge destination: Home 10. Overall Rehab/Functional Prognosis: excellent     MD Signature: Elizabeth Staggers, MD,  [facility] 05/17/2020         Revision History                               Note Details  Author Elizabeth Staggers, MD File Time 05/17/2020 12:28 PM  Author Type Physician Status Signed  Last Editor Elizabeth Staggers, MD Service Physical Medicine and [place] # [phone] Admit Date 05/17/2020

## 2020-05-19 NOTE — Progress Notes (Signed)
Patient information reviewed and entered into eRehab System by Becky Karn Derk, PPS coordinator. Information including medical coding, function ability, and quality indicators will be reviewed and updated through discharge.   

## 2020-05-19 NOTE — Progress Notes (Signed)
[facility] PHYSICAL MEDICINE & REHABILITATION PROGRESS NOTE   Subjective/Complaints: Patient seen sitting up in bed this morning.  She states she slept well overnight.  She states she had a good weekend.  She states therapies are going well.  ROS: Denies CP, SOB, N/V/D  Objective:   No results found. Recent Labs    05/17/20 1551  WBC 7.4  HGB 11.4*  HCT 35.1*  PLT 360   Recent Labs    05/17/20 1551  CREATININE 0.94    Intake/Output Summary (Last 24 hours) at 05/19/2020 0910 Last data filed at 05/18/2020 1805 Gross per 24 hour  Intake 436 ml  Output --  Net 436 ml        Physical Exam: Vital Signs Blood pressure 134/66, pulse 70, temperature 98.3 F (36.8 C), resp. rate 16, height 5\' 2"  (1.575 m), weight 60.3 kg, last menstrual period 07/19/1988, SpO2 95 %. Constitutional: No distress . Vital signs reviewed.  Frail. HENT: Normocephalic.  Atraumatic. Eyes: EOMI. No discharge. Cardiovascular: No JVD.  RRR. Respiratory: Normal effort.  No stridor.  Bilateral clear to auscultation. GI: Non-distended.  BS +. Skin: Warm and dry.  Intact. Psych: Slowed.  Flat. Musc: No edema in extremities.  Left clavicle with mild TTP. Neuro: Alert Ataxic speech Motor: Grossly 4+/5 throughout, left shoulder with mild limitation due to pain  Assessment/Plan: 1. Functional deficits secondary to gait disorder, left clavicular fx which require 3+ hours per day of interdisciplinary therapy in a comprehensive inpatient rehab setting.  Physiatrist is providing close team supervision and 24 hour management of active medical problems listed below.  Physiatrist and rehab team continue to assess barriers to discharge/monitor patient progress toward functional and medical goals  Care Tool:  Bathing    Body parts bathed by patient: Right arm, Left arm, Chest, Abdomen, Front perineal area, Right upper leg, Left upper leg, Face   Body parts bathed by helper: Buttocks, Right lower leg, Left  lower leg     Bathing assist Assist Level: Minimal Assistance - Patient > 75%     Upper Body Dressing/Undressing Upper body dressing   What is the patient wearing?: Pull over shirt    Upper body assist Assist Level: Contact Guard/Touching assist (standing to pull shirt over trunk)    Lower Body Dressing/Undressing Lower body dressing      What is the patient wearing?: Incontinence brief     Lower body assist Assist for lower body dressing: Maximal Assistance - Patient 25 - 49%     Toileting Toileting    Toileting assist Assist for toileting: Maximal Assistance - Patient 25 - 49%     Transfers Chair/bed transfer  Transfers assist     Chair/bed transfer assist level: Minimal Assistance - Patient > 75%     Locomotion Ambulation   Ambulation assist      Assist level: Minimal Assistance - Patient > 75% Assistive device: Hand held assist Max distance: 44'   Walk 10 feet activity   Assist     Assist level: Minimal Assistance - Patient > 75% Assistive device: Hand held assist   Walk 50 feet activity   Assist Walk 50 feet with 2 turns activity did not occur: Safety/medical concerns         Walk 150 feet activity   Assist Walk 150 feet activity did not occur: Safety/medical concerns         Walk 10 feet on uneven surface  activity   Assist Walk 10 feet on uneven surfaces  activity did not occur: Safety/medical concerns         Wheelchair     Assist Will patient use wheelchair at discharge?: No             Wheelchair 50 feet with 2 turns activity    Assist            Wheelchair 150 feet activity     Assist          Blood pressure 134/66, pulse 70, temperature 98.3 F (36.8 C), resp. rate 16, height 5\' 2"  (1.575 m), weight 60.3 kg, last menstrual period 07/19/1988, SpO2 95 %.   Medical Problem List and Plan: 1.  Decreased functional mobility secondary to left comminuted clavicle fracture and right knee  contusion after fall.  Nonweightbearing.  Follow-up Dr. Mardelle Matte.  Shoulder sling as directed.  Patient was cleared to use left upper extremity for support during ambulation with a walker but no aggressive active range of motion or passive range of motion.  Continue CIR 2.  Antithrombotics: -DVT/anticoagulation: Subcutaneous heparin.  -antiplatelet therapy: N/A 3. Pain Management: Hydrocodone as needed  Local heat/ice to shoulder as needed  Controlled with meds on 11/1 4. Mood: Zoloft 25 mg daily, Ativan 0.5 mg every 6 hours as needed anxiety             -antipsychotic agents: N/A 5. Neuropsych: This patient is capable of making decisions on her own behalf. 6. Skin/Wound Care: Routine skin checks 7. Fluids/Electrolytes/Nutrition: Routine in and outs.  CMP pending. 8.  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  Lasix 40 mg daily.    Filed Weights   05/17/20 1653 05/18/20 0500 05/19/20 0352  Weight: 62.4 kg 62.8 kg 60.3 kg               Stable on 11/1 9.  Parkinson's disease.    Sinemet 25-100 mg 2 tabs 3 times daily 10.  Hypertension.  Lisinopril 2.5 mg daily, Zebeta 2.5 mg daily  Labile on 11/20, monitor for trend 11.  Hyperlipidemia.  Pravachol 12.  Fusiform thoracic aneurysm ascending aorta, chronic and stable.  Follow-up outpatient Dr. Darcey Nora 13. Constipation:  ?  Improving on 11/1 14.  Anemia  Hemoglobin 11.4 on 10/30  Cont to monitor   LOS: 2 days A FACE TO FACE EVALUATION WAS PERFORMED  Elizabeth Mccullough Elizabeth Mccullough 05/19/2020, 9:10 AM

## 2020-05-20 ENCOUNTER — Inpatient Hospital Stay (HOSPITAL_COMMUNITY): Payer: Medicare Other | Admitting: Occupational Therapy

## 2020-05-20 ENCOUNTER — Inpatient Hospital Stay (HOSPITAL_COMMUNITY): Payer: Medicare Other

## 2020-05-20 ENCOUNTER — Encounter: Payer: Medicare Other | Admitting: Psychology

## 2020-05-20 DIAGNOSIS — N179 Acute kidney failure, unspecified: Secondary | ICD-10-CM

## 2020-05-20 NOTE — Progress Notes (Signed)
Physical Therapy Session Note  Patient Details  Name: Elizabeth Mccullough MRN: [id number] Date of Birth: September 12, 1940  Today's Date: 05/20/2020 PT Individual Time: 1100-1156 PT Individual Time Calculation (min): 56 min   Short Term Goals: Week 1:  PT Short Term Goal 1 (Week 1): Pt will complete least restrictive transfers with min A consistently PT Short Term Goal 2 (Week 1): Pt will ambulate x 75 ft with LRAD and min A PT Short Term Goal 3 (Week 1): Pt will demonstrate recall of WBing precautions with bed mobility and transfers 50% of the time  Skilled Therapeutic Interventions/Progress Updates:    Received pt supine in bed, pt agreeable to therapy, and reported pain 5/10 in L clavicle. RN made aware and planning to administer medication as able. Repositioning and rest breaks done to reduce pain levels. Session with emphasis on functional mobility/transfers, generalized strengthening, dynamic standing balance/coordination, ambulation, NMR, and improved activity tolerance. Pt performed bed mobility with min A and transferred bed<>WC stand<>pivot with RW and min A. Pt with continued posterior lean when standing. Pt transported to dayroom in [facility] total A for time management purposes and ambulated 162ft with RW and min A with WC follow. Pt with tendency to veer towards R and required max cues to avoid running into obstacles on R side. When therapist provided manual facilitation to adjust RW pt demonstrated "freezing" episodes and required increased time and cues to continue ambulating. Pt transferred sit<>stand without AD and min A and performed alternating toe taps to 3in steps 2x10 with mod A and cues to maintain LUE NWB precautions. Worked on dynamic standing balance with emphasis on taking big steps stepping to 3 colored dots with mod handheld A 2x2 trials bilaterally. Worked on dynamic standing balance playing cornhole without AD x 2 trials with mod A for balance. Attempted sit<>stand on Airex x 2  trials with mod A however pt unable to come into upright position and began pushing with LUE on therapist's arm despite cues for NWB precautions; returned to sitting for safety. Pt transported back to room in [facility] total A. Concluded session with pt sitting in WC, needs within reach, and seatbelt alarm on.   Therapy Documentation Precautions:  Precautions Precautions: Fall Precaution Comments: Per MD, ok to use walker but encourage pt to avoid pushing through L UE too much Required Braces or Orthoses: Sling Restrictions Weight Bearing Restrictions: Yes LUE Weight Bearing: Non weight bearing Other Position/Activity Restrictions: NWB in sling, but ok to use walker without pushing through L UE too much   Therapy/Group: Individual Therapy Alfonse Alpers PT, DPT   05/20/2020, 7:28 AM

## 2020-05-20 NOTE — Progress Notes (Signed)
Occupational Therapy Session Note  Patient Details  Name: Elizabeth Mccullough MRN: [id number] Date of Birth: 03-Dec-1940  Today's Date: 05/20/2020 OT Individual Time: 0900-0930 OT Individual Time Calculation (min): 30 min    Short Term Goals: Week 1:  OT Short Term Goal 1 (Week 1): Pt will complete 2/3 components of donning pants OT Short Term Goal 2 (Week 1): Pt will complete 1 grooming task while standing at the sink with no more than Min balance assistance OT Short Term Goal 3 (Week 1): Pt will complete shower transfer with LRAD and Min A  Skilled Therapeutic Interventions/Progress Updates:    Patient in bed, alert, she states that pain is under control as long as she doesn't move.  She tolerated session without c/o pain.  Supine to sit with CGA, sit to stand and ambulation with RW to/from bed, toilet with CGA.  She was able to complete toileting with min A, stood at sink for hand washing and oral care with CS.  LB dressing mod/max A, UB dressing min A, max A to manage sling.  Footwear dependent.  She returned to bed at close of session due to fatigue.  Bed alarm set and call bell in reach.    Therapy Documentation Precautions:  Precautions Precautions: Fall Precaution Comments: Per MD, ok to use walker but encourage pt to avoid pushing through L UE too much Required Braces or Orthoses: Sling Restrictions Weight Bearing Restrictions: Yes LUE Weight Bearing: Non weight bearing Other Position/Activity Restrictions: NWB in sling, but ok to use walker without pushing through L UE too much   Therapy/Group: Individual Therapy  Carlos Levering 05/20/2020, 7:35 AM

## 2020-05-20 NOTE — Progress Notes (Signed)
[place] PHYSICAL MEDICINE & REHABILITATION PROGRESS NOTE   Subjective/Complaints: Patient seen laying in bed this morning, working with therapies.  She states she slept well overnight.  Discussed cognition with therapies.  ROS: Denies CP, SOB, N/V/D  Objective:   No results found. Recent Labs    05/17/20 1551 05/19/20 0830  WBC 7.4 5.8  HGB 11.4* 10.8*  HCT 35.1* 34.1*  PLT 360 356   Recent Labs    05/17/20 1551 05/19/20 0830  NA  --  140  K  --  3.5  CL  --  100  CO2  --  30  GLUCOSE  --  106*  BUN  --  29*  CREATININE 0.94 1.07*  CALCIUM  --  9.2    Intake/Output Summary (Last 24 hours) at 05/20/2020 0931 Last data filed at 05/20/2020 0834 Gross per 24 hour  Intake 717 ml  Output --  Net 717 ml        Physical Exam: Vital Signs Blood pressure (!) 121/50, pulse 69, temperature 97.6 F (36.4 C), temperature source Oral, resp. rate 17, height 5\' 2"  (1.749 m), weight 57.7 kg, last menstrual period 07/19/1988, SpO2 98 %. Constitutional: No distress . Vital signs reviewed.  Frail. HENT: Normocephalic.  Atraumatic. Eyes: EOMI. No discharge. Cardiovascular: No JVD.  RRR. Respiratory: Normal effort.  No stridor.  Bilateral clear to auscultation. GI: Non-distended.  BS +. Skin: Warm and dry.  Intact. Psych: Flat.  Slightly slowed. Musc: No edema in extremities.  Left clavicle with mild TTP. Neuro: Alert Ataxic speech Motor: Grossly 4+/5 throughout, left shoulder with mild limitation due to pain, unchanged  Assessment/Plan: 1. Functional deficits secondary to gait disorder, left clavicular fx which require 3+ hours per day of interdisciplinary therapy in a comprehensive inpatient rehab setting.  Physiatrist is providing close team supervision and 24 hour management of active medical problems listed below.  Physiatrist and rehab team continue to assess barriers to discharge/monitor patient progress toward functional and medical goals  Care  Tool:  Bathing    Body parts bathed by patient: Right arm, Left arm, Chest, Abdomen, Face   Body parts bathed by helper: Front perineal area, Buttocks     Bathing assist Assist Level: Minimal Assistance - Patient > 75%     Upper Body Dressing/Undressing Upper body dressing   What is the patient wearing?: Pull over shirt    Upper body assist Assist Level: Minimal Assistance - Patient > 75%    Lower Body Dressing/Undressing Lower body dressing      What is the patient wearing?: Incontinence brief     Lower body assist Assist for lower body dressing: Maximal Assistance - Patient 25 - 49%     Toileting Toileting    Toileting assist Assist for toileting: Minimal Assistance - Patient > 75%     Transfers Chair/bed transfer  Transfers assist     Chair/bed transfer assist level: Minimal Assistance - Patient > 75%     Locomotion Ambulation   Ambulation assist      Assist level: Minimal Assistance - Patient > 75% Assistive device: Walker-rolling Max distance: 70ft   Walk 10 feet activity   Assist     Assist level: Minimal Assistance - Patient > 75% Assistive device: Walker-rolling   Walk 50 feet activity   Assist Walk 50 feet with 2 turns activity did not occur: Safety/medical concerns         Walk 150 feet activity   Assist Walk 150 feet activity did  not occur: Safety/medical concerns         Walk 10 feet on uneven surface  activity   Assist Walk 10 feet on uneven surfaces activity did not occur: Safety/medical concerns         Wheelchair     Assist Will patient use wheelchair at discharge?: No             Wheelchair 50 feet with 2 turns activity    Assist            Wheelchair 150 feet activity     Assist          Blood pressure (!) 121/50, pulse 69, temperature 97.6 F (36.4 C), temperature source Oral, resp. rate 17, height 5\' 2"  (1.575 m), weight 57.7 kg, last menstrual period 07/19/1988, SpO2 98  %.   Medical Problem List and Plan: 1.  Decreased functional mobility secondary to left comminuted clavicle fracture and right knee contusion after fall.  Nonweightbearing.  Follow-up Dr. Mardelle Matte.  Shoulder sling as directed.  Patient was cleared to use left upper extremity for support during ambulation with a walker but no aggressive active range of motion or passive range of motion.  Continue CIR 2.  Antithrombotics: -DVT/anticoagulation: Subcutaneous heparin.  -antiplatelet therapy: N/A 3. Pain Management: Hydrocodone as needed  Local heat/ice to shoulder as needed  Controlled with meds on 11/2 4. Mood: Zoloft 25 mg daily, Ativan 0.5 mg every 6 hours as needed anxiety             -antipsychotic agents: N/A 5. Neuropsych: This patient is capable of making decisions on her own behalf. 6. Skin/Wound Care: Routine skin checks 7. Fluids/Electrolytes/Nutrition: Routine in and outs. 8.  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  Lasix 40 mg daily.    Filed Weights   05/18/20 0500 05/19/20 0352 05/20/20 0515  Weight: 62.8 kg 60.3 kg 57.7 kg   ?  Reliability on 11/2 9.  Parkinson's disease.    Sinemet 25-100 mg 2 tabs 3 times daily 10.  Hypertension.  Lisinopril 2.5 mg daily, Zebeta 2.5 mg daily  Controlled on 11/2 11.  Hyperlipidemia.  Pravachol 12.  Fusiform thoracic aneurysm ascending aorta, chronic and stable.  Follow-up outpatient Dr. Darcey Nora 13. Constipation:  ?  Improving on 11/1 14.  Anemia  Hemoglobin 10.8 on 11/1  Cont to monitor 15.  AKI  Creatinine 1.07 on 11/1, labs ordered for tomorrow  Encourage fluids   LOS: 3 days A FACE TO FACE EVALUATION WAS PERFORMED  Elizabeth Mccullough Elizabeth Mccullough 05/20/2020, 9:31 AM

## 2020-05-20 NOTE — Progress Notes (Signed)
Occupational Therapy Session Note  Patient Details  Name: Elizabeth Mccullough MRN: [id number] Date of Birth: 1940-08-02  Today's Date: 05/20/2020 OT Individual Time: 9450-3888 OT Individual Time Calculation (min): 54 min    Short Term Goals: Week 1:  OT Short Term Goal 1 (Week 1): Pt will complete 2/3 components of donning pants OT Short Term Goal 2 (Week 1): Pt will complete 1 grooming task while standing at the sink with no more than Min balance assistance OT Short Term Goal 3 (Week 1): Pt will complete shower transfer with LRAD and Min A  Skilled Therapeutic Interventions/Progress Updates:    Treatment session with focus on activity tolerance and endurance.  Pt received semi-reclined in bed requesting to be repositioned due to discomfort, pt ultimately agreeable to getting OOB for therapy session.  Pt completed bed mobility with CGA and completed stand pivot transfer to w/c with CGA.  Engaged in sit > stand and standing tolerance while completing table top activity at high low table.  Pt able to tolerate standing 2-3 mins before requesting seated rest break.  Pt reports nausea during session, RN aware, but unable to elaborate.  Pt attempts to push or pull up with LUE, requiring cues to maintain NWB and arm in sling during therapeutic activities.  Pt reports "I need to go back to bed", unable to provide further insight but insistent on returning to room.  Therapist challenged pt to ambulated from doorway back to bed with RW with pt able to complete with min assist.  Therapist providing cues for sequencing of bed mobility and min physical assist for sequencing.  Pt left semi-reclined in bed with all needs in reach.  Therapy Documentation Precautions:  Precautions Precautions: Fall Precaution Comments: Per MD, ok to use walker but encourage pt to avoid pushing through L UE too much Required Braces or Orthoses: Sling Restrictions Weight Bearing Restrictions: Yes LUE Weight Bearing: Non  weight bearing Other Position/Activity Restrictions: NWB in sling, but ok to use walker without pushing through L UE too much General:   Vital Signs: Therapy Vitals Temp: 98.5 F (36.9 C) Pulse Rate: 70 Resp: 16 BP: (!) 112/57 Patient Position (if appropriate): Lying Oxygen Therapy SpO2: 100 % O2 Device: Room Air Pain: Pain Assessment Pain Scale: Faces Faces Pain Scale: Hurts even more Pain Type: Acute pain Pain Location: Shoulder Pain Orientation: Left Pain Descriptors / Indicators: Aching;Discomfort Pain Frequency: Intermittent Pain Onset: On-going Patients Stated Pain Goal: 3 Pain Intervention(s): Medication (See eMAR) (tylenol given)   Therapy/Group: Individual Therapy  [name] 05/20/2020, 3:07 PM

## 2020-05-21 ENCOUNTER — Inpatient Hospital Stay (HOSPITAL_COMMUNITY): Payer: Medicare Other

## 2020-05-21 ENCOUNTER — Inpatient Hospital Stay (HOSPITAL_COMMUNITY): Payer: Medicare Other | Admitting: Occupational Therapy

## 2020-05-21 LAB — BASIC METABOLIC PANEL
Anion gap: 9 (ref 5–15)
BUN: 27 mg/dL — ABNORMAL HIGH (ref 8–23)
CO2: 31 mmol/L (ref 22–32)
Calcium: 9.3 mg/dL (ref 8.9–10.3)
Chloride: 100 mmol/L (ref 98–111)
Creatinine, Ser: 0.99 mg/dL (ref 0.44–1.00)
GFR, Estimated: 57 mL/min — ABNORMAL LOW (ref >60)
Glucose, Bld: 104 mg/dL — ABNORMAL HIGH (ref 70–99)
Potassium: 3.5 mmol/L (ref 3.5–5.1)
Sodium: 140 mmol/L (ref 135–145)

## 2020-05-21 NOTE — Plan of Care (Signed)
°  Problem: Consults Goal: RH GENERAL PATIENT EDUCATION Description: See Patient Education module for education specifics. Outcome: Progressing   Problem: RH BOWEL ELIMINATION Goal: RH STG MANAGE BOWEL WITH ASSISTANCE Description: STG Manage Bowel with min Assistance. Outcome: Progressing Goal: RH STG MANAGE BOWEL W/MEDICATION W/ASSISTANCE Description: STG Manage Bowel with Medication with min Assistance. Outcome: Progressing   Problem: RH BLADDER ELIMINATION Goal: RH STG MANAGE BLADDER WITH ASSISTANCE Description: STG Manage Bladder With min Assistance Outcome: Progressing   Problem: RH SKIN INTEGRITY Goal: RH STG SKIN FREE OF INFECTION/BREAKDOWN Description: Skin to remain free of breakdown with min assist Outcome: Progressing Goal: RH STG MAINTAIN SKIN INTEGRITY WITH ASSISTANCE Description: STG Maintain Skin Integrity With min Assistance. Outcome: Progressing   Problem: RH SAFETY Goal: RH STG ADHERE TO SAFETY PRECAUTIONS W/ASSISTANCE/DEVICE Description: STG Adhere to Safety Precautions With min Assistance/Device. Outcome: Progressing   Problem: RH PAIN MANAGEMENT Goal: RH STG PAIN MANAGED AT OR BELOW PT'S PAIN GOAL Description: Less than 3 out of 10 Outcome: Progressing   Problem: RH KNOWLEDGE DEFICIT GENERAL Goal: RH STG INCREASE KNOWLEDGE OF SELF CARE AFTER HOSPITALIZATION Outcome: Progressing

## 2020-05-21 NOTE — Patient Care Conference (Signed)
Inpatient RehabilitationTeam Conference and Plan of Care Update Date: 05/21/2020   Time: 11:37 AM    Patient Name: Elizabeth Mccullough      Medical Record Number: [id number]  Date of Birth: [birth year] Sex: Female         Room/Bed: 4W07C/4W07C-01 Payor Info: Payor: [place] / Plan: [employer] MEDICARE / Product Type: *No Product type* /    Admit Date/Time:  05/17/2020  3:02 PM  Primary Diagnosis:  Clavicle fracture  Hospital Problems: Principal Problem:   Clavicle fracture Active Problems:   Anemia   Essential hypertension   Parkinson's disease ([place])   Chronic combined systolic and diastolic congestive heart failure ([place])   Clavicle pain   AKI (acute kidney injury) [facility])    Expected Discharge Date: Expected Discharge Date: 05/29/20  Team Members Present: Physician leading conference: Dr. Delice Lesch Care Coodinator Present: Dorien Chihuahua, RN, BSN, CRRN;Becky Dupree, LCSW Nurse Present:  Tommie Sams, RN) PT Present: Becky Sax, PT OT Present: [name], OT PPS Coordinator present : Ileana Ladd, Burna Mortimer, SLP     Current Status/Progress Goal Weekly Team Focus  Bowel/Bladder   Pt is mostly continent of bowel/bladder. Does experience incontinence at night. LBM-11/02  To become more continent overall.  Assess tolieting needs often   Swallow/Nutrition/ Hydration             ADL's   CGA ambulatory transfers with RW, Min A UB bathing/dressing, Max A LB bathing/dressing  Supervision overall, CGA shower transfer.  Will require assistance to don sling  ADL retraining, activity tolerance/endurance, recall of NWB through LUE during functional tasks   Mobility   bed mobility mod A, transfers min A with RW, gait 58ft with RW min A  supervision  functional mobility/transfers, generalized strengthening, dynamic standing balance/coordination, ambulation, and endurance   Communication   Mod A increase vocal intensity phrase/sentence  Supervision A   awareness of reduced vocal intensity   Safety/Cognition/ Behavioral Observations  Mod A, likely progression of parkinsons but exacerbated deficits, ST x1-3 a week  Min-Supervision A - basic  basic problem solving with ADLs, emergent awareness, sustained attention, and safety awareness   Pain   Pt has had some left shoulder pain. Has tylenol and norco prn  To decrease pain level below 3/10.  Assess pain q shift or prn. Give pain meds when needed.   Skin   Skin is intact. Ecchymosis to left shoulder/chest, abdomen.  Promote healing and preventing skin breakdown from occurring.  Assess skin q shift or prn.     Discharge Planning:  Returning to [facility] with some additional hired assist. Pt was receiving PT,OT,SP at the facility prior to admission to the hospital   Team Discussion: BP fluctuates; enc fluids. Pain issues address and assistance needed for positioning. Needs assist to don/doff sling.Also note right side neglect, not safe with rollator. Requires cues for weight bearing precautions and safety awareness Patient on target to meet rehab goals: yes, supervision goals set  *See Care Plan and progress notes for long and short-term goals.   Revisions to Treatment Plan:   Teaching Needs: Transfers, toileting, medications, safety, weight bearing restrictions, etc.  Current Barriers to Discharge: Decreased caregiver support and Weight bearing restrictions  Possible Resolutions to Barriers: Private paid caregivers to supplement at [facility] education     Medical Summary Current Status: Decreased functional mobility secondary to left comminuted clavicle fracture and right knee contusion after fall.  Nonweightbearing.  Barriers to Discharge: Medical  stability;Weight bearing restrictions   Possible Resolutions to [employer]: Therapies, follow labs - Cr, Hb, optimize BP meds, follow weights, optimize pain meds   Continued Need for Acute  Rehabilitation Level of Care: The patient requires daily medical management by a physician with specialized training in physical medicine and rehabilitation for the following reasons: Direction of a multidisciplinary physical rehabilitation program to maximize functional independence : Yes Medical management of patient stability for increased activity during participation in an intensive rehabilitation regime.: Yes Analysis of laboratory values and/or radiology reports with any subsequent need for medication adjustment and/or medical intervention. : Yes   I attest that I was present, lead the team conference, and concur with the assessment and plan of the team.   Dorien Chihuahua B 05/21/2020, 2:19 PM

## 2020-05-21 NOTE — Progress Notes (Signed)
Occupational Therapy Session Note  Patient Details  Name: Elizabeth Mccullough MRN: [id number] Date of Birth: 1941/04/09  Today's Date: 05/21/2020 OT Individual Time: 2919-1660 OT Individual Time Calculation (min): 56 min    Short Term Goals: Week 1:  OT Short Term Goal 1 (Week 1): Pt will complete 2/3 components of donning pants OT Short Term Goal 2 (Week 1): Pt will complete 1 grooming task while standing at the sink with no more than Min balance assistance OT Short Term Goal 3 (Week 1): Pt will complete shower transfer with LRAD and Min A  Skilled Therapeutic Interventions/Progress Updates:    Treatment session with focus on self-care retraining with functional mobility, dynamic standing balance, and adherence to NWB through LUE.  Pt received supine in bed awake and agreeable to therapy session with encouragement.  Pt reports need to toilet.  Completed bed mobility with CGA and Min assist to stand from EOB for lifting.  Pt ambulated to toilet with RW with CGA and completed toileting with assistance to pull pants over hips post toileting.  Pt ambulated to sink with RW with CGA to engage in bathing and dressing.  Pt able to complete UB bathing and able to doff and don shirt with setup assist and min cues for sequencing to increase independence while minimizing overuse of LUE.  Pt completed oral care in standing with setup assist for opening items.  Pt tends to fluctuate between overuse of LUE and then stating that she can't do a task, requiring total assist to then complete it.  Pt reports fatigue, requesting to return to bed.  Pt ambulated to bed with RW with CGA and completed transfer back in to bed with min assist for positioning.  Pt remained semi-reclined in bed with all needs in reach.  Therapy Documentation Precautions:  Precautions Precautions: Fall Precaution Comments: Per MD, ok to use walker but encourage pt to avoid pushing through L UE too much Required Braces or Orthoses:  Sling Restrictions Weight Bearing Restrictions: Yes LUE Weight Bearing: Non weight bearing Other Position/Activity Restrictions: NWB in sling, but ok to use walker without pushing through L UE too much Pain:  Pt with reports of pain in Lt clavicle with movement, not rated.  Repositioned and reminded of NWB status.   Therapy/Group: Individual Therapy  [name] 05/21/2020, 8:44 AM

## 2020-05-21 NOTE — Progress Notes (Signed)
[facility] PHYSICAL MEDICINE & REHABILITATION PROGRESS NOTE   Subjective/Complaints: Patient seen laying in bed this morning.  She states she slept fairly overnight, because she was woken up early for vital signs.  She gets me confused.  ROS: Denies CP, SOB, N/V/D  Objective:   No results found. Recent Labs    05/19/20 0830  WBC 5.8  HGB 10.8*  HCT 34.1*  PLT 356   Recent Labs    05/19/20 0830 05/21/20 0407  NA 140 140  K 3.5 3.5  CL 100 100  CO2 30 31  GLUCOSE 106* 104*  BUN 29* 27*  CREATININE 1.07* 0.99  CALCIUM 9.2 9.3    Intake/Output Summary (Last 24 hours) at 05/21/2020 1022 Last data filed at 05/21/2020 0901 Gross per 24 hour  Intake 537 ml  Output --  Net 537 ml        Physical Exam: Vital Signs Blood pressure (!) 166/69, pulse 73, temperature 98.2 F (36.8 C), temperature source Oral, resp. rate 14, height 5\' 2"  (1.575 m), weight 58.1 kg, last menstrual period 07/19/1988, SpO2 100 %. Constitutional: No distress . Vital signs reviewed.  Frail. HENT: Normocephalic.  Atraumatic. Eyes: EOMI. No discharge. Cardiovascular: No JVD.  RRR. Respiratory: Normal effort.  No stridor.  Bilateral clear to auscultation. GI: Non-distended.  BS +. Skin: Warm and dry.  Intact. Psych: Flat.  Slowed. Musc: No edema in extremities.  Left clavicle with mild TTP. Neuro: Alert Ataxic speech Motor: Grossly 4+/5 throughout, left shoulder with mild limitation due to pain, stable  Assessment/Plan: 1. Functional deficits secondary to gait disorder, left clavicular fx which require 3+ hours per day of interdisciplinary therapy in a comprehensive inpatient rehab setting.  Physiatrist is providing close team supervision and 24 hour management of active medical problems listed below.  Physiatrist and rehab team continue to assess barriers to discharge/monitor patient progress toward functional and medical goals  Care Tool:  Bathing    Body parts bathed by patient: Right  arm, Left arm, Chest, Abdomen, Face, Front perineal area   Body parts bathed by helper: Front perineal area, Buttocks     Bathing assist Assist Level: Contact Guard/Touching assist     Upper Body Dressing/Undressing Upper body dressing   What is the patient wearing?: Pull over shirt    Upper body assist Assist Level: Minimal Assistance - Patient > 75%    Lower Body Dressing/Undressing Lower body dressing      What is the patient wearing?: Underwear/pull up, Pants     Lower body assist Assist for lower body dressing: Moderate Assistance - Patient 50 - 74%     Toileting Toileting    Toileting assist Assist for toileting: Minimal Assistance - Patient > 75%     Transfers Chair/bed transfer  Transfers assist     Chair/bed transfer assist level: Minimal Assistance - Patient > 75%     Locomotion Ambulation   Ambulation assist      Assist level: Minimal Assistance - Patient > 75% Assistive device: Walker-rolling Max distance: 162ft   Walk 10 feet activity   Assist     Assist level: Minimal Assistance - Patient > 75% Assistive device: Walker-rolling   Walk 50 feet activity   Assist Walk 50 feet with 2 turns activity did not occur: Safety/medical concerns  Assist level: Minimal Assistance - Patient > 75% Assistive device: Walker-rolling    Walk 150 feet activity   Assist Walk 150 feet activity did not occur: Safety/medical concerns  Assist level:  Minimal Assistance - Patient > 75% Assistive device: Walker-rolling    Walk 10 feet on uneven surface  activity   Assist Walk 10 feet on uneven surfaces activity did not occur: Safety/medical concerns         Wheelchair     Assist Will patient use wheelchair at discharge?: No             Wheelchair 50 feet with 2 turns activity    Assist            Wheelchair 150 feet activity     Assist          Blood pressure (!) 166/69, pulse 73, temperature 98.2 F (36.8 C),  temperature source Oral, resp. rate 14, height 5\' 2"  (1.575 m), weight 58.1 kg, last menstrual period 07/19/1988, SpO2 100 %.   Medical Problem List and Plan: 1.  Decreased functional mobility secondary to left comminuted clavicle fracture and right knee contusion after fall.  Nonweightbearing.  Follow-up Dr. Mardelle Matte.  Shoulder sling as directed.  Patient was cleared to use left upper extremity for support during ambulation with a walker but no aggressive active range of motion or passive range of motion.  Continue CIR  Team conference today to discuss current and goals and coordination of care, home and environmental barriers, and discharge planning with nursing, case manager, and therapies. Please see conference note from today as well.  2.  Antithrombotics: -DVT/anticoagulation: Subcutaneous heparin.  -antiplatelet therapy: N/A 3. Pain Management: Hydrocodone as needed  Local heat/ice to shoulder as needed  Controlled with meds on 11/3 4. Mood: Zoloft 25 mg daily, Ativan 0.5 mg every 6 hours as needed anxiety             -antipsychotic agents: N/A 5. Neuropsych: This patient is capable of making decisions on her own behalf. 6. Skin/Wound Care: Routine skin checks 7. Fluids/Electrolytes/Nutrition: Routine in and outs. 8.  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  Lasix 40 mg daily.    Filed Weights   05/19/20 0352 05/20/20 0515 05/21/20 0625  Weight: 60.3 kg 57.7 kg 58.1 kg   Stable on 11/3 9.  Parkinson's disease.    Sinemet 25-100 mg 2 tabs 3 times daily 10.  Hypertension.  Lisinopril 2.5 mg daily, Zebeta 2.5 mg daily  Labile on 11/3, monitor trend 11.  Hyperlipidemia.  Pravachol 12.  Fusiform thoracic aneurysm ascending aorta, chronic and stable.  Follow-up outpatient Dr. Darcey Nora 13. Constipation:  ?  Improving on 11/1 14.  Anemia  Hemoglobin 10.8 on 11/1  Cont to monitor 15.  AKI  Creatinine 0.9 on 11/3, BUN elevated, GFR  57  Encourage fluids   LOS: 4 days A FACE TO FACE EVALUATION WAS PERFORMED  Hiromi Knodel [name] 05/21/2020, 10:22 AM

## 2020-05-21 NOTE — Progress Notes (Signed)
Speech Language Pathology Daily Session Note  Patient Details  Name: Elizabeth Mccullough MRN: [id number] Date of Birth: [birth year]  Today's Date: 05/21/2020 SLP Individual Time: 1001-1100 SLP Individual Time Calculation (min): 59 min  Short Term Goals: Week 1: SLP Short Term Goal 1 (Week 1): Pt will demonstrate sustained attention and keeping eyes open in 5 minute intervals with min A verbal cues for redirection. SLP Short Term Goal 2 (Week 1): Pt will demonstrate basic problem solving in ADL tasks with min A verbal cues. SLP Short Term Goal 3 (Week 1): Pt will verbalize and demonstrate safety awareness during functional tasks with min A verbal cues. SLP Short Term Goal 4 (Week 1): Pt will increase vocal intensity at phrase level for 90% intelligibility with min A verbal cues.  Skilled Therapeutic Interventions:Skilled ST services focused on cognitive skills. Pt complained of headache upon entering and required supervision A to locate call bell and increase vocal intensity when requesting medication. SLP assisted pt to the bathroom with RW, pt required min A verbal cues for problem solving/following safety protocol (including pushing up with right hand to stand with walker, placing right hand back in bed/seat when sitting back and rinsing off soap following handwashing.) SLP facilitated sustained attention in basic sorting task by colors and then by shapes in a field of 6 mod I problem solving and sustained attention in 10 minute interval. SLP increase complexity level of problem solving skills in novel card task (Blink) played at basic level pt initially required only min A verbal cues for problem solving, error awareness and recall of 3 rules, but as the task continued pt required increasing cues (max A verbal cues) likely due to fading sustained attention and lethargy. Pt was left in room with call bell within reach and bed alarm set. SLP recommends to continue skilled services.     Pain Pain  Assessment Pain Scale: 0-10 Pain Score: 6  Pain Type: Acute pain Pain Location: Head Pain Orientation: Left Pain Descriptors / Indicators: Headache Pain Onset: Gradual Pain Intervention(s): Medication (See eMAR);RN made aware;Rest  Therapy/Group: Individual Therapy  Candy Ziegler  [facility] 05/21/2020, 10:59 AM

## 2020-05-21 NOTE — Progress Notes (Signed)
Patient ID: Elizabeth Mccullough, female   DOB: 28-Jun-1941, 79 y.o.   MRN: [id number]  Met with pt and spoke with [name] via telephone to discuss team conference goals supervision-CGA and more min for bathing and dressing due to her sling. Pt does have a rolling walker along with a rollator at home. Will also inform [facility] to inform of discharge. [name] is off on 11/11 which would make it easier to transport back home. Have asked team if ok with discharge 11/11 instead of 11/12. All are good with along with MD. Have moved discharge to 11/11. Pt has all needed equipment and will arrange for resumption of therapies at discharge. Family working on hired assist.

## 2020-05-21 NOTE — Progress Notes (Signed)
Physical Therapy Session Note  Patient Details  Name: Elizabeth Mccullough MRN: [id number] Date of Birth: 1941-05-12  Today's Date: 05/21/2020 PT Individual Time: 5643-3295 and 1345-1441 PT Individual Time Calculation (min): 24 min and 56 min  Short Term Goals: Week 1:  PT Short Term Goal 1 (Week 1): Pt will complete least restrictive transfers with min A consistently PT Short Term Goal 2 (Week 1): Pt will ambulate x 75 ft with LRAD and min A PT Short Term Goal 3 (Week 1): Pt will demonstrate recall of WBing precautions with bed mobility and transfers 50% of the time  Skilled Therapeutic Interventions/Progress Updates:   Treatment Session 1: 1103-1127 24 min Received pt supine in bed, pt agreeable to therapy, and reported mild headache (premedicated) and pain in LUE with movement but no pain at rest. Session with emphasis on functional mobility/transfers, generalized strengthening, dynamic standing balance/coordination, ambulation, toileting, and improved activity tolerance. Pt transferred supine<>sitting EOB with HOB elevated and mod A for trunk control. Pt required increased time and cues for technique to scoot hips to EOB and required cues to maintain LUE NWB precautions as pt attempting to push with LUE to scoot on bed. Pt reported urge to urinate and donned LUE sling total A and transferred sit<>stand with RW mod A and ambulated 51ft x 2 trials with RW and min A to/from bathroom with cues for RW safety. Pt required mod A for clothing management and able to void and perform peri-care with CGA and increased time. Pt able to stand and wash hands at sink with CGA. Worked on retro stepping backwards from sink to WC 31ft with cues for step length and RW safety. Pt performed the following exercises standing at sink with CGA and cues for technique and to avoid pulling on sink with LUE: -heel raises 2x12 -alternating marching 2x20 Concluded session with pt sitting in WC, needs within reah, and seatbelt  alarm on.   Treatment Session 2: 1884-1660 63 min Received pt sitting in WC, pt agreeable to therapy, and denied any pain during session. Session with emphasis on functional mobility/transfers, generalized strengthening, dynamic standing balance/coordination, ambulation, stair navigation, and improved activity tolerance. Pt transported to therapy gym in [facility] total A for time management purposes and navigated 8 steps with 1 rail min A ascending and descending with a step to pattern with mod/max cues to avoid pulling with LUE. Pt transported to dayroom and ambulated 139ft with RW and min A. Pt with R inattention frequently running into obstacles on R side despite cues. Pt also demonstrated shuffling gait pattern, flexed, trunk, and poor RW safety awareness pushing RW too far forward. Pt demonstrated multiple freezing episodes when turning to sit in [employer] throughout session requring extensive time to continue. Pt performed standing LE strengthening on Kinetron at 20cm/sec for 2x25 reps with RUE support and min/mod A for balance with therapist supporting LUE to maintain LUE NWB precautions. Pt required mod cues for upright posture/gaze as pt with tendency to stare at feet. Pt reported feeling "shaky" afterwards and required multiple rest breaks throughout session. Pt transferred sit<>stand at table in dayroom with min A and performed the following balance activities beginning with 1UE support fading to no UE support: -tandem stance x 3 trials on RLE and 1 trial on L LE; x6 seconds leading with RLE and x28 seconds leading with RLE with min A for balance -SLS x 2 trials bilaterally; x5 seconds on RLE and x4 seconds on L LE without UE support and mod  A for balance Pt performed TUG with RW and min A with multiple attempts to stand due to pt's posterior lean. Trial 1: 53 seconds and Trial 2: 52 seconds. Pt educated on test results, significance indicating high fall risk, and importance of using AD for safety. Pt transported  back to room in [facility] total A and transferred WC<>bed stand<>pivot with RW and min A and sit<>supine with mod A. Concluded session with pt supine in bed, needs within reach, and bed alarm on.   Therapy Documentation Precautions:  Precautions Precautions: Fall Precaution Comments: Per MD, ok to use walker but encourage pt to avoid pushing through L UE too much Required Braces or Orthoses: Sling Restrictions Weight Bearing Restrictions: Yes LUE Weight Bearing: Non weight bearing Other Position/Activity Restrictions: NWB in sling, but ok to use walker without pushing through L UE too much  Therapy/Group: Individual Therapy Alfonse Alpers PT, DPT   05/21/2020, 7:36 AM

## 2020-05-22 ENCOUNTER — Inpatient Hospital Stay (HOSPITAL_COMMUNITY): Payer: Medicare Other | Admitting: Occupational Therapy

## 2020-05-22 ENCOUNTER — Inpatient Hospital Stay (HOSPITAL_COMMUNITY): Payer: Medicare Other

## 2020-05-22 ENCOUNTER — Inpatient Hospital Stay (HOSPITAL_COMMUNITY): Payer: Medicare Other | Admitting: Speech Pathology

## 2020-05-22 DIAGNOSIS — I5032 Chronic diastolic (congestive) heart failure: Secondary | ICD-10-CM

## 2020-05-22 NOTE — Progress Notes (Signed)
[place] PHYSICAL MEDICINE & REHABILITATION PROGRESS NOTE   Subjective/Complaints: Patient seen sitting up in bed this morning.  She states she slept well overnight.  She denies complaints.  She is questions regarding discharge disposition, discussed with patient.  She is aware of her discharge date.  ROS: Denies CP, SOB, N/V/D  Objective:   No results found. No results for input(s): WBC, HGB, HCT, PLT in the last 72 hours. Recent Labs    05/21/20 0407  NA 140  K 3.5  CL 100  CO2 31  GLUCOSE 104*  BUN 27*  CREATININE 0.99  CALCIUM 9.3    Intake/Output Summary (Last 24 hours) at 05/22/2020 1357 Last data filed at 05/22/2020 0900 Gross per 24 hour  Intake 420 ml  Output --  Net 420 ml        Physical Exam: Vital Signs Blood pressure (!) 149/64, pulse 66, temperature 98.4 F (36.9 C), temperature source Oral, resp. rate 17, height 5\' 2"  (1.575 m), weight 126 lb 8.7 oz (57.4 kg), last menstrual period 07/19/1988, SpO2 99 %. Constitutional: No distress . Vital signs reviewed.  Frail. HENT: Normocephalic.  Atraumatic. Eyes: EOMI. No discharge. Cardiovascular: No JVD.  RRR. Respiratory: Normal effort.  No stridor.  Bilateral clear to auscultation. GI: Non-distended.  BS +. Skin: Warm and dry.  Intact. Psych: Flat.  Slowed. Musc: No edema in extremities.  Left clavicle TTP. Neuro: Alert Delayed Motor: Grossly 4+/5 throughout, left shoulder with mild limitation due to pain, unchanged  Assessment/Plan: 1. Functional deficits secondary to gait disorder, left clavicular fx which require 3+ hours per day of interdisciplinary therapy in a comprehensive inpatient rehab setting.  Physiatrist is providing close team supervision and 24 hour management of active medical problems listed below.  Physiatrist and rehab team continue to assess barriers to discharge/monitor patient progress toward functional and medical goals  Care Tool:  Bathing    Body parts bathed by  patient: Right arm, Left arm, Chest, Abdomen, Face, Front perineal area   Body parts bathed by helper: Front perineal area, Buttocks     Bathing assist Assist Level: Contact Guard/Touching assist     Upper Body Dressing/Undressing Upper body dressing   What is the patient wearing?: Pull over shirt    Upper body assist Assist Level: Minimal Assistance - Patient > 75%    Lower Body Dressing/Undressing Lower body dressing      What is the patient wearing?: Underwear/pull up, Pants     Lower body assist Assist for lower body dressing: Moderate Assistance - Patient 50 - 74%     Toileting Toileting    Toileting assist Assist for toileting: Minimal Assistance - Patient > 75%     Transfers Chair/bed transfer  Transfers assist     Chair/bed transfer assist level: Minimal Assistance - Patient > 75%     Locomotion Ambulation   Ambulation assist      Assist level: Minimal Assistance - Patient > 75% Assistive device: Hand held assist Max distance: 50'   Walk 10 feet activity   Assist     Assist level: Minimal Assistance - Patient > 75% Assistive device: Hand held assist   Walk 50 feet activity   Assist Walk 50 feet with 2 turns activity did not occur: Safety/medical concerns  Assist level: Minimal Assistance - Patient > 75% Assistive device: Hand held assist    Walk 150 feet activity   Assist Walk 150 feet activity did not occur: Safety/medical concerns  Assist level: Minimal Assistance -  Patient > 75% Assistive device: Walker-rolling    Walk 10 feet on uneven surface  activity   Assist Walk 10 feet on uneven surfaces activity did not occur: Safety/medical concerns         Wheelchair     Assist Will patient use wheelchair at discharge?: No             Wheelchair 50 feet with 2 turns activity    Assist            Wheelchair 150 feet activity     Assist          Blood pressure (!) 149/64, pulse 66, temperature  98.4 F (36.9 C), temperature source Oral, resp. rate 17, height 5\' 2"  (1.575 m), weight 126 lb 8.7 oz (57.4 kg), last menstrual period 07/19/1988, SpO2 99 %.   Medical Problem List and Plan: 1.  Decreased functional mobility secondary to left comminuted clavicle fracture and right knee contusion after fall.  Nonweightbearing.  Follow-up Dr. Mardelle Matte.  Shoulder sling as directed.  Patient was cleared to use left upper extremity for support during ambulation with a walker but no aggressive active range of motion or passive range of motion.  Continue CIR 2.  Antithrombotics: -DVT/anticoagulation: Subcutaneous heparin.  -antiplatelet therapy: N/A 3. Pain Management: Hydrocodone as needed  Local heat/ice to shoulder as needed  Controlled with meds on 11/14 4. Mood: Zoloft 25 mg daily, Ativan 0.5 mg every 6 hours as needed anxiety             -antipsychotic agents: N/A 5. Neuropsych: This patient is capable of making decisions on her own behalf. 6. Skin/Wound Care: Routine skin checks 7. Fluids/Electrolytes/Nutrition: Routine in and outs. 8.  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  Lasix 40 mg daily.    Filed Weights   05/20/20 0515 05/21/20 0625 05/22/20 0451  Weight: 127 lb 3.3 oz (57.7 kg) 128 lb 1.4 oz (58.1 kg) 126 lb 8.7 oz (57.4 kg)   Stable on 11/4 9.  Parkinson's disease.    Sinemet 25-100 mg 2 tabs 3 times daily 10.  Hypertension.  Lisinopril 2.5 mg daily, Zebeta 2.5 mg daily  Elevated on 11/4, however labile previously, monitor trend 11.  Hyperlipidemia.  Pravachol 12.  Fusiform thoracic aneurysm ascending aorta, chronic and stable.  Follow-up outpatient Dr. Darcey Nora 13. Constipation:  Improving 14.  Anemia  Hemoglobin 10.8 on 11/1  Cont to monitor 15.  AKI  Creatinine 0.99 on 11/3, BUN elevated, GFR 57  Encourage fluids   LOS: 5 days A FACE TO FACE EVALUATION WAS PERFORMED  Morad Tal [name] 05/22/2020, 1:57 PM

## 2020-05-22 NOTE — Progress Notes (Signed)
Physical Therapy Session Note  Patient Details  Name: Elizabeth Mccullough MRN: [id number] Date of Birth: 10-May-1941  Today's Date: 05/22/2020 PT Individual Time: 1325-1355 PT Individual Time Calculation (min): 30 min   Short Term Goals: Week 1:  PT Short Term Goal 1 (Week 1): Pt will complete least restrictive transfers with min A consistently PT Short Term Goal 2 (Week 1): Pt will ambulate x 75 ft with LRAD and min A PT Short Term Goal 3 (Week 1): Pt will demonstrate recall of WBing precautions with bed mobility and transfers 50% of the time  Skilled Therapeutic Interventions/Progress Updates:    Pt reoriented to place and situation at start of session, perseverating on where "[name]" was throughout session and redirected. Extra time for bed mobility and min facilitation for movement (placed sling total assist with HOB elevated to prevent use of LUE during functional mobility) and cues for scooting forward to EOB and sequencing. Min assist for facilitation for sit <> stands with HHA and functional transfers. Dynamic gait in distracting environment with HHA x 50' with focus on increasing step length, widening BOS, and smooth movement especially during turning and transitioning during transfers.Pt with shuffled gait pattern, narrow BOS, and decreased functional balance during turning.  Left up in w/c with all needs in reach and safety belt donned.   Therapy Documentation Precautions:  Precautions Precautions: Fall Precaution Comments: Per MD, ok to use walker but encourage pt to avoid pushing through L UE too much Required Braces or Orthoses: Sling Restrictions Weight Bearing Restrictions: Yes LUE Weight Bearing: Non weight bearing Other Position/Activity Restrictions: NWB in sling, but ok to use walker without pushing through L UE too much Pain: Pain Assessment Pain Scale: 0-10 Pain Score: 0-No pain   Therapy/Group: Individual Therapy  Canary Brim Ivory Broad, PT,  DPT, CBIS  05/22/2020, 1:57 PM

## 2020-05-22 NOTE — Progress Notes (Signed)
Physical Therapy Session Note  Patient Details  Name: Elizabeth Mccullough MRN: [id number] Date of Birth: 10/18/1940  Today's Date: 05/22/2020 PT Individual Time: 1430-1526 PT Individual Time Calculation (min): 56 min   Short Term Goals: Week 1:  PT Short Term Goal 1 (Week 1): Pt will complete least restrictive transfers with min A consistently PT Short Term Goal 2 (Week 1): Pt will ambulate x 75 ft with LRAD and min A PT Short Term Goal 3 (Week 1): Pt will demonstrate recall of WBing precautions with bed mobility and transfers 50% of the time  Skilled Therapeutic Interventions/Progress Updates:   Received pt sitting in WC, pt agreeable to therapy, and denied any pain during session. Session with emphasis on functional mobility/transfers, generalized strengthening, dynamic standing balance/coordination, ambulation, simulated car transfers, toileting, and improved activity tolerance. Pt performed WC mobility 66ft using BLE and RUE with supervision and significantly increased time. Pt required hand-over-hand cues for propulsion technique and to increase stride. Pt transported remainder of way to ortho gym and performed ambulatory simulated car transfer with RW and min/mod A. Pt required cues for turning technique and safe entry into car as pt attempting to side step into car. Pt also required max cues to maintain LUE NWB precautions as pt attempting to push and pull with LUE to scoot into car. Pt ambulated 60ft on uneven surfaces (ramp) with RW and min A with cues for eccentric control of RW when descending ramp. Pt transported to rehab apartment and performed ambulatory furniture transfer onto recliner with mod A. Pt required cues for anterior weight shifting and upright posture/gaze when standing due to posterior lean. Pt then stated "I have to go to the bathroom" and transported back to room in [facility] total A. Pt ambulated 98ft x 2 trials with RW and CGA to/from bathroom and able to void with mod A for  clothing management and cues for LUE precautions. Pt stood at sink and washed hands with CGA. Pt transferred sit<>supine with min A, increased time, and cues for technique. Pt able to bridge and scoot hips to get in midline position with max cues and increased time. RN present at end of session to administer medications. Concluded session with pt supine in bed, needs within reach, and bed alarm on. LUE supported on pillow.   Therapy Documentation Precautions:  Precautions Precautions: Fall Precaution Comments: Per MD, ok to use walker but encourage pt to avoid pushing through L UE too much Required Braces or Orthoses: Sling Restrictions Weight Bearing Restrictions: Yes LUE Weight Bearing: Non weight bearing Other Position/Activity Restrictions: NWB in sling, but ok to use walker without pushing through L UE too much   Therapy/Group: Individual Therapy Alfonse Alpers PT, DPT   05/22/2020, 7:38 AM

## 2020-05-22 NOTE — Progress Notes (Signed)
Occupational Therapy Session Note  Patient Details  Name: Elizabeth Mccullough MRN: [id number] Date of Birth: 1941-04-29  Today's Date: 05/22/2020 OT Individual Time: 1345-1415 OT Individual Time Calculation (min): 30 min    Short Term Goals: Week 1:  OT Short Term Goal 1 (Week 1): Pt will complete 2/3 components of donning pants OT Short Term Goal 2 (Week 1): Pt will complete 1 grooming task while standing at the sink with no more than Min balance assistance OT Short Term Goal 3 (Week 1): Pt will complete shower transfer with LRAD and Min A  Skilled Therapeutic Interventions/Progress Updates:    Pt resting in w/c upon arrival watching [employer] movies on TV. Pt agreeable to allowing therapist to mute TV. Communications with pt challenging 2/2 low volume and decreased phonation. Pt agreeable to practicing sit<>stand without use of LUE. Min A for sit<>stand X 5. Rest breaks between. Pt remained in w/c with belt alarm activated and all needs within reach. Volume on TV turned up for pt.   Therapy Documentation Precautions:  Precautions Precautions: Fall Precaution Comments: Per MD, ok to use walker but encourage pt to avoid pushing through L UE too much Required Braces or Orthoses: Sling Restrictions Weight Bearing Restrictions: Yes LUE Weight Bearing: Non weight bearing Other Position/Activity Restrictions: NWB in sling, but ok to use walker without pushing through L UE too much General:   Pain: Pt denies pain this afternoon   Therapy/Group: Individual Therapy  Leroy Libman 05/22/2020, 2:40 PM

## 2020-05-22 NOTE — Progress Notes (Signed)
Speech Language Pathology Daily Session Note  Patient Details  Name: Elizabeth Mccullough MRN: [id number] Date of Birth: 17-Dec-1940  Today's Date: 05/22/2020 SLP Individual Time: 6712-4580 SLP Individual Time Calculation (min): 50 min  Short Term Goals: Week 1: SLP Short Term Goal 1 (Week 1): Pt will demonstrate sustained attention and keeping eyes open in 5 minute intervals with min A verbal cues for redirection. SLP Short Term Goal 2 (Week 1): Pt will demonstrate basic problem solving in ADL tasks with min A verbal cues. SLP Short Term Goal 3 (Week 1): Pt will verbalize and demonstrate safety awareness during functional tasks with min A verbal cues. SLP Short Term Goal 4 (Week 1): Pt will increase vocal intensity at phrase level for 90% intelligibility with min A verbal cues.  Skilled Therapeutic Interventions:   Patient seen to address speech-language goals by SLP. Patient in bed during treatment session with intermittent requests for SLP to adjust [facility] due to some discomfort. Patient was initially aphonic but as session progressed, she was able to sustain phonation at phrase level at adequate intensity. She performed sustained phonation of vowel sounds for durations of 5-7 seconds, increasing vocal intensity at phoneme level, and performed pursed lip controlled breathing exercise with SLP with initially moderate cues to initiate but fading to minA. Patient was not able to adequately perform EMST device at lowest setting and plan will be to continue practicing with this. Patient requested to use bathroom and she was able to perform transfer from bed to [facility] and to and from toilet with SLP providing verbal and tactile cues to remind her of NWB left UE. Patient continues to benefit from skilled SLP intervention to maximize speech, voice, cognitive function prior to discharge.   Pain Pain Assessment Pain Scale: 0-10 Pain Score: 0-No pain  Therapy/Group: Individual Therapy   Sonia Baller, MA,  CCC-SLP Speech Therapy

## 2020-05-23 ENCOUNTER — Inpatient Hospital Stay (HOSPITAL_COMMUNITY): Payer: Medicare Other | Admitting: Speech Pathology

## 2020-05-23 ENCOUNTER — Inpatient Hospital Stay (HOSPITAL_COMMUNITY): Payer: Medicare Other

## 2020-05-23 ENCOUNTER — Inpatient Hospital Stay (HOSPITAL_COMMUNITY): Payer: Medicare Other | Admitting: Occupational Therapy

## 2020-05-23 NOTE — Progress Notes (Signed)
[facility] PHYSICAL MEDICINE & REHABILITATION PROGRESS NOTE   Subjective/Complaints: No complaints this morning. Denies pain, constipation, insomnia.   ROS: Denies CP, SOB, N/V/D  Objective:   No results found. No results for input(s): WBC, HGB, HCT, PLT in the last 72 hours. Recent Labs    05/21/20 0407  NA 140  K 3.5  CL 100  CO2 31  GLUCOSE 104*  BUN 27*  CREATININE 0.99  CALCIUM 9.3    Intake/Output Summary (Last 24 hours) at 05/23/2020 1129 Last data filed at 05/23/2020 0859 Gross per 24 hour  Intake 480 ml  Output --  Net 480 ml        Physical Exam: Vital Signs Blood pressure 133/65, pulse 66, temperature 98.4 F (36.9 C), resp. rate 16, height 5\' 2"  (1.575 m), weight 58.8 kg, last menstrual period 07/19/1988, SpO2 98 %.  General: Alert, No apparent distress HEENT: Head is normocephalic, atraumatic, PERRLA, EOMI, sclera anicteric, oral mucosa pink and moist, dentition intact, ext ear canals clear,  Neck: Supple without JVD or lymphadenopathy Heart: Reg rate and rhythm. No murmurs rubs or gallops Chest: CTA bilaterally without wheezes, rales, or rhonchi; no distress Abdomen: Soft, non-tender, non-distended, bowel sounds positive. Extremities: No clubbing, cyanosis, or edema. Pulses are 2+  Psych: Flat.  Slowed. Musc: No edema in extremities.  Left clavicle TTP. Neuro: Alert Delayed Motor: Grossly 4+/5 throughout, left shoulder with mild limitation due to pain, unchanged  Assessment/Plan: 1. Functional deficits secondary to gait disorder, left clavicular fx which require 3+ hours per day of interdisciplinary therapy in a comprehensive inpatient rehab setting.  Physiatrist is providing close team supervision and 24 hour management of active medical problems listed below.  Physiatrist and rehab team continue to assess barriers to discharge/monitor patient progress toward functional and medical goals  Care Tool:  Bathing    Body parts bathed by  patient: Right arm, Left arm, Chest, Abdomen, Face, Front perineal area   Body parts bathed by helper: Front perineal area, Buttocks     Bathing assist Assist Level: Contact Guard/Touching assist     Upper Body Dressing/Undressing Upper body dressing   What is the patient wearing?: Pull over shirt    Upper body assist Assist Level: Minimal Assistance - Patient > 75%    Lower Body Dressing/Undressing Lower body dressing      What is the patient wearing?: Underwear/pull up, Pants     Lower body assist Assist for lower body dressing: Moderate Assistance - Patient 50 - 74%     Toileting Toileting    Toileting assist Assist for toileting: Minimal Assistance - Patient > 75%     Transfers Chair/bed transfer  Transfers assist     Chair/bed transfer assist level: Minimal Assistance - Patient > 75%     Locomotion Ambulation   Ambulation assist      Assist level: Minimal Assistance - Patient > 75% Assistive device: Hand held assist Max distance: 50'   Walk 10 feet activity   Assist     Assist level: Minimal Assistance - Patient > 75% Assistive device: Hand held assist   Walk 50 feet activity   Assist Walk 50 feet with 2 turns activity did not occur: Safety/medical concerns  Assist level: Minimal Assistance - Patient > 75% Assistive device: Hand held assist    Walk 150 feet activity   Assist Walk 150 feet activity did not occur: Safety/medical concerns  Assist level: Minimal Assistance - Patient > 75% Assistive device: Walker-rolling  Walk 10 feet on uneven surface  activity   Assist Walk 10 feet on uneven surfaces activity did not occur: Safety/medical concerns   Assist level: Minimal Assistance - Patient > 75% Assistive device: Aeronautical engineer Will patient use wheelchair at discharge?: No Type of Wheelchair: Manual    Wheelchair assist level: Supervision/Verbal cueing Max wheelchair distance: 36ft     Wheelchair 50 feet with 2 turns activity    Assist            Wheelchair 150 feet activity     Assist          Blood pressure 133/65, pulse 66, temperature 98.4 F (36.9 C), resp. rate 16, height 5\' 2"  (1.575 m), weight 58.8 kg, last menstrual period 07/19/1988, SpO2 98 %.   Medical Problem List and Plan: 1.  Decreased functional mobility secondary to left comminuted clavicle fracture and right knee contusion after fall.  Nonweightbearing.  Follow-up Dr. Mardelle Matte.  Shoulder sling as directed.  Patient was cleared to use left upper extremity for support during ambulation with a walker but no aggressive active range of motion or passive range of motion.  Continue CIR 2.  Antithrombotics: -DVT/anticoagulation: Subcutaneous heparin.  -antiplatelet therapy: N/A 3. Pain Management: Hydrocodone as needed  Local heat/ice to shoulder as needed  Controlled with meds 11/5 4. Mood: Zoloft 25 mg daily, Ativan 0.5 mg every 6 hours as needed anxiety             -antipsychotic agents: N/A 5. Neuropsych: This patient is capable of making decisions on her own behalf. 6. Skin/Wound Care: Routine skin checks 7. Fluids/Electrolytes/Nutrition: Routine in and outs. 8.  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  Lasix 40 mg daily.    Filed Weights   05/21/20 0625 05/22/20 0451 05/23/20 0600  Weight: 58.1 kg 57.4 kg 58.8 kg   Stable on 11/5 9.  Parkinson's disease.    Sinemet 25-100 mg 2 tabs 3 times daily 10.  Hypertension.  Lisinopril 2.5 mg daily, Zebeta 2.5 mg daily  Stable 11/5 11.  Hyperlipidemia.  Pravachol 12.  Fusiform thoracic aneurysm ascending aorta, chronic and stable.  Follow-up outpatient Dr. Darcey Nora 13. Constipation:  Improving 14.  Anemia  Hemoglobin 10.8 on 11/1  Cont to monitor 15.  AKI  Creatinine 0.99 on 11/3, BUN elevated, GFR 57  Encourage fluids   LOS: 6 days A FACE TO FACE EVALUATION WAS  PERFORMED  Elyas Villamor P Byrd Rushlow 05/23/2020, 11:29 AM

## 2020-05-23 NOTE — Evaluation (Signed)
Recreational Therapy Assessment and Plan  Patient Details  Name: Elizabeth Mccullough MRN: [id number] Date of Birth: 1941/06/03 Today's Date: 05/23/2020  Rehab Potential:  Fair ELOS:   d/c 11/11  Assessment   Hospital Problem: Principal Problem:   Clavicle fracture   Past Medical History:      Past Medical History:  Diagnosis Date   AAA (abdominal aortic aneurysm) ([place])    Carotid artery disease ([place]) 01/12/2017   Carotid US 6/18: Bilat < 50%; repeat in 12/2000   Chronic combined systolic and diastolic CHF (congestive heart failure) ([place]) 03/01/2017   NICM // [place] 8/18: normal coronary arteries /  Echo 8/18: EF 20-25, mod AI, mod MR, PASP 32 // [place] 1/19: EF 55-60, no RWMA, Gr 1 DD, mod AI, mild MR, PASP 32   Hyperlipidemia    Parkinson's disease ([place])    Past Surgical History:       Past Surgical History:  Procedure Laterality Date   BREAST CYST ASPIRATION  1965   Right Breast   HAMMER TOE SURGERY  04/10/02   Left Toe   KNEE SURGERY Right 10/17/15   meniscus repair   NASAL SEPTUM SURGERY  1980   RIGHT/LEFT HEART CATH AND CORONARY ANGIOGRAPHY N/A 03/03/2017   Procedure: RIGHT/LEFT HEART CATH AND CORONARY ANGIOGRAPHY;  Surgeon: Nelva Bush, MD;  Location: [facility];  Service: Cardiovascular;  Laterality: N/A;   THORACIC AORTOGRAM N/A 03/03/2017   Procedure: Thoracic Aortogram;  Surgeon: Nelva Bush, MD;  Location: [facility];  Service: Cardiovascular;  Laterality: N/A;    Assessment & Plan Clinical Impression:  Elizabeth BShaughnessyis an 79 year old right-handed female with history of hypertension, Parkinson's disease maintained on Sinemet, 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 Per chart review patient resides independent living facility  [place] since March 2021. Ambulates with a rolling walker but does endorse frequent falls. She was able to bathe dress feet and told herself and goes to the dining hall for meals.. Daughter and family plan to hire assistance as needed. Presented 05/13/2020 after falling 3 times on day of presentation. Denied loss of consciousness. Admission chemistries glucose 100, troponin 26>29, hemoglobin 10.6, urinalysis negative nitrite. Cranial CT scan as well as CT cervical spine showed no acute intracranial pathology and no acute traumatic cervical spine pathology. X-rays right knee no acute osseous abnormality. Findings of comminuted displaced left clavicle fracture. Follow-up orthopedic services Dr. Mardelle Matte in regards to left clavicle fracture also right knee contusion with conservative care and no surgical intervention. Nonweightbearing left upper extremity with shoulder sling. Cardiology service follow-up for history of CHF with latest chest x-ray showing probable left pleural effusion and left lung base atelectasis and echocardiogram completed showing ejection fraction 30 to 28% grade 1 diastolic dysfunction, moderate hypokinesis LV... Patient currently remains on Lasix 40 mg daily as well as lisinopril with Zebeta 2.5 mg daily. Subcutaneous heparin added for DVT prophylaxis. Therapy evaluations completed and patient was admitted for a comprehensive rehab program. Patient transferred to CIR on 05/17/2020.   Met with pt today per team referral to discuss leisure interests.  Team reports pt is limited by fatigues.  Upon arrival, pt reports fatigue from previous sessions but attempts to participate, although dozing throughout.  Pt shares that she used to enjoy reading but needs reading glasses.  Discussed audio books as an option.  Pt also reports that she  tends to stay to herself vs socialize.  Pt continuing to to fall asleep, full eval deferred.   Plan  no further TR  Recommendations for  other services: None   Discharge Criteria: Patient will be discharged from TR if patient refuses treatment 3 consecutive times without medical reason.  If treatment goals not met, if there is a change in medical status, if patient makes no progress towards goals or if patient is discharged from hospital.  The above assessment, treatment plan, treatment alternatives and goals were discussed and mutually agreed upon: by patient  [place] 05/23/2020, 12:13 PM

## 2020-05-23 NOTE — Progress Notes (Signed)
Physical Therapy Session Note  Patient Details  Name: Elizabeth Mccullough MRN: [id number] Date of Birth: Jan 31, 1941  Today's Date: 05/23/2020 PT Individual Time: 0800-0900 PT Individual Time Calculation (min): 60 min   Short Term Goals: Week 1:  PT Short Term Goal 1 (Week 1): Pt will complete least restrictive transfers with min A consistently PT Short Term Goal 2 (Week 1): Pt will ambulate x 75 ft with LRAD and min A PT Short Term Goal 3 (Week 1): Pt will demonstrate recall of WBing precautions with bed mobility and transfers 50% of the time Week 2:     Skilled Therapeutic Interventions/Progress Updates:    PAIN R clavicle area, rates as 6/10.  Nursing notified and pain meds requested.  Treatment to tolerance, care w/dressing and mobility.  Pt initially supine.  Supine to sit on edge of bed w/hob elevated w/cues and min assist, verbalizes without cues that she is NWB on LUE but does attempt to wb w/scooting.  Sit to stand mult times w/min to light mod assist due to post tendency w/transition  during dressing, bathing of perineal area, and changing of depends undergarment.   Pt performs all w/min assist and set up for task, cga for balance in standing w/RW, maintains sitting balance w/upperbody dressing w/supervision.  Gait 45ft to commode and commode transfer w/cga and cues for safety, min assist for clothing management.  Pt continent of urine and set up assist for hygiene in sitting, cga for balance in standing.  Gait 24ft to sink where pt stood w/cga and brushed teeth, washed face, brushed hair w/light set up assist.   Gait 161ft w/cga, cues for increased step length/ht esp w/turning, cadence varies from functional to slowed esp w/fatigue.  Repeated Stepping over 6in cones standing w/walker to work on step ht and length, performs 10x bilat. W/cga, cues.   AT END of session pt transported back to room. Pt left oob in wc w/alarm belt set and needs in reach    Therapy  Documentation Precautions:  Precautions Precautions: Fall Precaution Comments: Per MD, ok to use walker but encourage pt to avoid pushing through L UE too much Required Braces or Orthoses: Sling Restrictions Weight Bearing Restrictions: Yes LUE Weight Bearing: Non weight bearing Other Position/Activity Restrictions: NWB in sling, but ok to use walker without pushing through L UE too much G   Therapy/Group: Individual Therapy  Callie Fielding, [place] 05/23/2020, 12:53 PM

## 2020-05-23 NOTE — Progress Notes (Signed)
Occupational Therapy Session Note  Patient Details  Name: Elizabeth Mccullough MRN: [id number] Date of Birth: 1941-03-24  Today's Date: 05/23/2020 OT Individual Time: 1165-7903 OT Individual Time Calculation (min): 56 min    Short Term Goals: Week 1:  OT Short Term Goal 1 (Week 1): Pt will complete 2/3 components of donning pants OT Short Term Goal 2 (Week 1): Pt will complete 1 grooming task while standing at the sink with no more than Min balance assistance OT Short Term Goal 3 (Week 1): Pt will complete shower transfer with LRAD and Min A  Skilled Therapeutic Interventions/Progress Updates:    Treatment session with focus on functional mobility, sit > stand, and standing balance/endurance.  Pt received upright in w/c declining bathing/dressing this morning but agreeable to therapy session. Pt reporting fatigue but agreeable to session with plan to return to bed afterwards.  Therapist donned sling to maintain NWB of LUE.  Engaged in sit > stand at high low table with focus on pushing up from w/c with RUE instead of attempting to pull up on chair with both RUE and LUE.  Pt completed table top puzzle in standing, with ability to stand for 12 minutes without requiring seated rest break.  Therapist providing cues for sequencing and problem solving during puzzle.  Engaged in short distance ambulation 41' x2 with RW with focus on positioning within RW as well as hand placement during sit <> stand.  Therapist provided cues with each sit <> stand to push up from chair/edge of mat and reach back for surface.  Pt returned to room and ambulated 20' back to bed with RW and CGA.  Pt returned to supine with assistance to advance BLE in to bed.  Pt remained semi-reclined with all needs in reach.  Therapy Documentation Precautions:  Precautions Precautions: Fall Precaution Comments: Per MD, ok to use walker but encourage pt to avoid pushing through L UE too much Required Braces or Orthoses:  Sling Restrictions Weight Bearing Restrictions: Yes LUE Weight Bearing: Non weight bearing Other Position/Activity Restrictions: NWB in sling, but ok to use walker without pushing through L UE too much Pain:  Pt with c/o pain in clavicle with movement.  Educated on NWB status  Therapy/Group: Individual Therapy  [name] 05/23/2020, 12:01 PM

## 2020-05-23 NOTE — Plan of Care (Signed)
  Problem: Consults Goal: RH GENERAL PATIENT EDUCATION Description: See Patient Education module for education specifics. Outcome: Progressing   Problem: RH BOWEL ELIMINATION Goal: RH STG MANAGE BOWEL WITH ASSISTANCE Description: STG Manage Bowel with min Assistance. Outcome: Progressing Goal: RH STG MANAGE BOWEL W/MEDICATION W/ASSISTANCE Description: STG Manage Bowel with Medication with min Assistance. Outcome: Progressing   Problem: RH BLADDER ELIMINATION Goal: RH STG MANAGE BLADDER WITH ASSISTANCE Description: STG Manage Bladder With min Assistance Outcome: Progressing   Problem: RH SKIN INTEGRITY Goal: RH STG SKIN FREE OF INFECTION/BREAKDOWN Description: Skin to remain free of breakdown with min assist Outcome: Progressing Goal: RH STG MAINTAIN SKIN INTEGRITY WITH ASSISTANCE Description: STG Maintain Skin Integrity With min Assistance. Outcome: Progressing   Problem: RH SAFETY Goal: RH STG ADHERE TO SAFETY PRECAUTIONS W/ASSISTANCE/DEVICE Description: STG Adhere to Safety Precautions With min Assistance/Device. Outcome: Progressing   Problem: RH PAIN MANAGEMENT Goal: RH STG PAIN MANAGED AT OR BELOW PT'S PAIN GOAL Description: Less than 3 out of 10 Outcome: Progressing   Problem: RH KNOWLEDGE DEFICIT GENERAL Goal: RH STG INCREASE KNOWLEDGE OF SELF CARE AFTER HOSPITALIZATION Outcome: Progressing

## 2020-05-23 NOTE — Progress Notes (Signed)
Speech Language Pathology Daily Session Note  Patient Details  Name: Elizabeth Mccullough MRN: [id number] Date of Birth: 1941/03/22  Today's Date: 05/23/2020 SLP Individual Time: 1315-1400 SLP Individual Time Calculation (min): 45 min  Short Term Goals: Week 1: SLP Short Term Goal 1 (Week 1): Pt will demonstrate sustained attention and keeping eyes open in 5 minute intervals with min A verbal cues for redirection. SLP Short Term Goal 2 (Week 1): Pt will demonstrate basic problem solving in ADL tasks with min A verbal cues. SLP Short Term Goal 3 (Week 1): Pt will verbalize and demonstrate safety awareness during functional tasks with min A verbal cues. SLP Short Term Goal 4 (Week 1): Pt will increase vocal intensity at phrase level for 90% intelligibility with min A verbal cues.  Skilled Therapeutic Interventions:   Patient seen for skilled ST session focusing on speech/voice and cognitive-linguistic abilities. Patient was more alert than previous session but had more difficulty maintaining adequate vocal intensity. She was able to perform 10 reps of EMST set at resistance of 74mL and incentive spirometer in range of 800-1050mL (yesterday she was not able to complete EMST at all and incentive spirometer was 261mL max. Patient was able to recall three different therapy tasks completed with SLP providing min-modA cues. Vocal intensity at phrase level was variable from 20% to 75%. She demonstrated sustained attention and some selective attention playing card game Juel Burrow) with SLP. Patient continues to benefit from skilled SLP intervention to maximize speech-language and cognitive function prior to discharge.   Pain Pain Assessment Pain Scale: 0-10 Pain Score: 0-No pain  Therapy/Group: Individual Therapy  Sonia Baller, MA, CCC-SLP Speech Therapy

## 2020-05-24 MED ORDER — LORAZEPAM 0.5 MG PO TABS
0.5000 mg | ORAL_TABLET | Freq: Three times a day (TID) | ORAL | Status: DC | PRN
  Administered 2020-05-25: 0.5 mg via ORAL
  Filled 2020-05-24 (×2): qty 1

## 2020-05-24 MED ORDER — DOCUSATE SODIUM 100 MG PO CAPS
100.0000 mg | ORAL_CAPSULE | Freq: Every day | ORAL | Status: DC
  Administered 2020-05-24 – 2020-05-31 (×8): 100 mg via ORAL
  Filled 2020-05-24 (×8): qty 1

## 2020-05-24 MED ORDER — MELATONIN 3 MG PO TABS
3.0000 mg | ORAL_TABLET | Freq: Every day | ORAL | Status: DC
  Administered 2020-05-24 – 2020-05-30 (×7): 3 mg via ORAL
  Filled 2020-05-24 (×7): qty 1

## 2020-05-24 NOTE — Progress Notes (Signed)
[place] PHYSICAL MEDICINE & REHABILITATION PROGRESS NOTE   Subjective/Complaints: +pain at right clavicle fracture and shoulder. Does not respond when I asked if she wants voltaren gel +insomnia. She does want melatonin +constipation: will add daily colace  ROS: Denies CP, SOB, N/V/D  Objective:   No results found. No results for input(s): WBC, HGB, HCT, PLT in the last 72 hours. No results for input(s): NA, K, CL, CO2, GLUCOSE, BUN, CREATININE, CALCIUM in the last 72 hours.  Intake/Output Summary (Last 24 hours) at 05/24/2020 0937 Last data filed at 05/23/2020 1842 Gross per 24 hour  Intake 640 ml  Output --  Net 640 ml        Physical Exam: Vital Signs Blood pressure (!) 148/68, pulse 69, temperature 97.6 F (36.4 C), resp. rate 15, height 5\' 2"  (1.575 m), weight 58.5 kg, last menstrual period 07/19/1988, SpO2 100 %. General: Alert, No apparent distress HEENT: Head is normocephalic, atraumatic, PERRLA, EOMI, sclera anicteric, oral mucosa pink and moist, dentition intact, ext ear canals clear,  Neck: Supple without JVD or lymphadenopathy Heart: Reg rate and rhythm. No murmurs rubs or gallops Chest: CTA bilaterally without wheezes, rales, or rhonchi; no distress Abdomen: Soft, non-tender, non-distended, bowel sounds positive. Extremities: No clubbing, cyanosis, or edema. Pulses are 2+ Skin: Clean and intact without signs of breakdown  Psych: Flat.  Slowed. Says "I want to be saved" into phone Musc: No edema in extremities.  Left clavicle, shoulder TTP. Neuro: Alert. Left hand tremor while holding phone Delayed Motor: Grossly 4+/5 throughout, left shoulder with mild limitation due to pain, unchanged  Assessment/Plan: 1. Functional deficits secondary to gait disorder, left clavicular fx which require 3+ hours per day of interdisciplinary therapy in a comprehensive inpatient rehab setting.  Physiatrist is providing close team supervision and 24 hour management of active  medical problems listed below.  Physiatrist and rehab team continue to assess barriers to discharge/monitor patient progress toward functional and medical goals  Care Tool:  Bathing    Body parts bathed by patient: Right arm, Left arm, Chest, Abdomen, Face, Front perineal area   Body parts bathed by helper: Front perineal area, Buttocks     Bathing assist Assist Level: Contact Guard/Touching assist     Upper Body Dressing/Undressing Upper body dressing   What is the patient wearing?: Pull over shirt    Upper body assist Assist Level: Minimal Assistance - Patient > 75%    Lower Body Dressing/Undressing Lower body dressing      What is the patient wearing?: Underwear/pull up, Pants     Lower body assist Assist for lower body dressing: Moderate Assistance - Patient 50 - 74%     Toileting Toileting    Toileting assist Assist for toileting: Minimal Assistance - Patient > 75%     Transfers Chair/bed transfer  Transfers assist     Chair/bed transfer assist level: Minimal Assistance - Patient > 75%     Locomotion Ambulation   Ambulation assist      Assist level: Contact Guard/Touching assist Assistive device: Walker-rolling Max distance: 150   Walk 10 feet activity   Assist     Assist level: Minimal Assistance - Patient > 75% Assistive device: Walker-rolling   Walk 50 feet activity   Assist Walk 50 feet with 2 turns activity did not occur: Safety/medical concerns  Assist level: Contact Guard/Touching assist Assistive device: Walker-rolling    Walk 150 feet activity   Assist Walk 150 feet activity did not occur: Safety/medical concerns  Assist level: Contact Guard/Touching assist Assistive device: Walker-rolling    Walk 10 feet on uneven surface  activity   Assist Walk 10 feet on uneven surfaces activity did not occur: Safety/medical concerns   Assist level: Minimal Assistance - Patient > 75% Assistive device: Horticulturist, commercial Will patient use wheelchair at discharge?: No Type of Wheelchair: Manual    Wheelchair assist level: Supervision/Verbal cueing Max wheelchair distance: 73ft    Wheelchair 50 feet with 2 turns activity    Assist            Wheelchair 150 feet activity     Assist          Blood pressure (!) 148/68, pulse 69, temperature 97.6 F (36.4 C), resp. rate 15, height 5\' 2"  (1.575 m), weight 58.5 kg, last menstrual period 07/19/1988, SpO2 100 %.   Medical Problem List and Plan: 1.  Decreased functional mobility secondary to left comminuted clavicle fracture and right knee contusion after fall.  Nonweightbearing.  Follow-up Dr. Mardelle Matte.  Shoulder sling as directed.  Patient was cleared to use left upper extremity for support during ambulation with a walker but no aggressive active range of motion or passive range of motion.  Continue CIR 2.  Antithrombotics: -DVT/anticoagulation: Subcutaneous heparin.  -antiplatelet therapy: N/A 3. Pain Management: Hydrocodone as needed  Local heat/ice to shoulder as needed  Controlled with meds 11/5. Discussed adding voltaren gel but she did not respond when I asked.  4. Mood: Zoloft 25 mg daily, Ativan 0.5 mg- wean to q8H prn given fall risk             -antipsychotic agents: N/A 5. Neuropsych: This patient is capable of making decisions on her own behalf. 6. Skin/Wound Care: Routine skin checks 7. Fluids/Electrolytes/Nutrition: Routine in and outs. 8.  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  Lasix 40 mg daily.    Filed Weights   05/22/20 0451 05/23/20 0600 05/24/20 0500  Weight: 57.4 kg 58.8 kg 58.5 kg   Stable 11/6 9.  Parkinson's disease.    Sinemet 25-100 mg 2 tabs 3 times daily 10.  Hypertension.  Lisinopril 2.5 mg daily, Zebeta 2.5 mg daily  Stable 11/6 11.  Hyperlipidemia.  Pravachol 12.  Fusiform thoracic aneurysm ascending aorta, chronic and stable.   Follow-up outpatient Dr. Darcey Nora 13. Constipation:  Improving 14.  Anemia  Hemoglobin 10.8 on 11/1  Cont to monitor 15.  AKI  Creatinine 0.99 on 11/3, BUN elevated, GFR 57  Encourage fluids   LOS: 7 days A FACE TO FACE EVALUATION WAS PERFORMED  Elizabeth Mccullough 05/24/2020, 9:37 AM

## 2020-05-25 ENCOUNTER — Inpatient Hospital Stay (HOSPITAL_COMMUNITY): Payer: Medicare Other | Admitting: Physical Therapy

## 2020-05-25 ENCOUNTER — Inpatient Hospital Stay (HOSPITAL_COMMUNITY): Payer: Medicare Other | Admitting: Speech Pathology

## 2020-05-25 NOTE — Progress Notes (Signed)
Speech Language Pathology Daily Session Note  Patient Details  Name: SAGAN WURZEL MRN: [id number] Date of Birth: [birth year]  Today's Date: 05/25/2020 SLP Individual Time: 9937-1696 SLP Individual Time Calculation (min): 28 min  Short Term Goals: Week 1: SLP Short Term Goal 1 (Week 1): Pt will demonstrate sustained attention and keeping eyes open in 5 minute intervals with min A verbal cues for redirection. SLP Short Term Goal 2 (Week 1): Pt will demonstrate basic problem solving in ADL tasks with min A verbal cues. SLP Short Term Goal 3 (Week 1): Pt will verbalize and demonstrate safety awareness during functional tasks with min A verbal cues. SLP Short Term Goal 4 (Week 1): Pt will increase vocal intensity at phrase level for 90% intelligibility with min A verbal cues.  Skilled Therapeutic Interventions:  Pt was seen for skilled ST targeting goals for speech intelligibility.  During functional conversations with SLP, pt needed mod assist verbal cues to increase vocal intensity to achieve intelligibility at the phrase level but during structured speech tasks (singing in unison with therapist) pt needed up to max assist to increase vocal intensity.  Pt was left in her wheelchair with chair alarm set and call bell within reach.  Continue per current plan of care.    Pain Pain Assessment Pain Scale: 0-10 Pain Score: 0-No pain  Therapy/Group: Individual Therapy  Rosevelt Luu, Selinda Orion 05/25/2020, 12:19 PM

## 2020-05-25 NOTE — Progress Notes (Signed)
Physical Therapy Session Note  Patient Details  Name: Elizabeth Mccullough MRN: [id number] Date of Birth: 1940/09/06  Today's Date: 05/25/2020 PT Individual Time: 4098-1191; 1410-1440 PT Individual Time Calculation (min): 55 min   Short Term Goals: Week 1:  PT Short Term Goal 1 (Week 1): Pt will complete least restrictive transfers with min A consistently PT Short Term Goal 2 (Week 1): Pt will ambulate x 75 ft with LRAD and min A PT Short Term Goal 3 (Week 1): Pt will demonstrate recall of WBing precautions with bed mobility and transfers 50% of the time  Skilled Therapeutic Interventions/Progress Updates:    Session 1: Pt received seated in bed, agreeable to PT session. No complaints of pain at rest, does have onset of LUE pain with mobility that subsides again at rest. Semi-reclined in bed to sitting EOB with min A with cues to adhere to [place] with LUE. Stand pivot transfer bed to w/c with RW and CGA. Ambulation 2 x 140 ft with RW and CGA for balance, with onset of fatigue pt exhibits decreased bilateral step length as well as path deviation to the R. Pt requires cues to avoid obstacles on R side. Toilet transfer with CGA and use of RW. Pt is independent for pericare and min A needed to pull pants up over hips. Ambulation with RW through obstacle course weaving through cones with CGA and cues for safe RW management as pt tends to push RW ahead of her instead of staying within RW with turns. Static standing balance with no UE support and CGA while playing cornhole x 3 rounds. Pt left seated in w/c in room with needs in reach, quick release belt and chair alarm in place at end of session.  Session 2: Pt received seated in bed, agreeable with encouragement to participate in therapy session. No complaints of pain. Semi-reclined to sitting EOB with CGA, cues for adherence to [place] LUE. Stand pivot transfer to w/c with no AD and CGA. Standing alt L/R 4" step taps with RW for support and CGA  progressing to no UE support and min HHA for balance. Pt then reports feeling "dizzy" and "sick" due to not eating lunch. Offered pt a snack, symptoms subside at rest. Pt requests to return to bed at end of session, Supervision for sit to supine. Pt left seated in bed with needs in reach, bed alarm in place at end of session. Pt then reports urge to use the bathroom, NT notified and able to assist pt to bathroom.  Therapy Documentation Precautions:  Precautions Precautions: Fall Precaution Comments: Per MD, ok to use walker but encourage pt to avoid pushing through L UE too much Required Braces or Orthoses: Sling Restrictions Weight Bearing Restrictions: Yes LUE Weight Bearing: Non weight bearing Other Position/Activity Restrictions: NWB in sling, but ok to use walker without pushing through L UE too much   Therapy/Group: Individual Therapy   Excell Seltzer, PT, DPT  05/25/2020, 12:06 PM

## 2020-05-25 NOTE — Progress Notes (Signed)
[place] PHYSICAL MEDICINE & REHABILITATION PROGRESS NOTE   Subjective/Complaints: No complaints this morning Left clavicle pain stable this morning- stable when resting Working on improving vocal strength and cognition Denies CP  ROS: Denies CP, SOB, N/V/D  Objective:   No results found. No results for input(s): WBC, HGB, HCT, PLT in the last 72 hours. No results for input(s): NA, K, CL, CO2, GLUCOSE, BUN, CREATININE, CALCIUM in the last 72 hours.  Intake/Output Summary (Last 24 hours) at 05/25/2020 1056 Last data filed at 05/25/2020 0717 Gross per 24 hour  Intake 680 ml  Output --  Net 680 ml        Physical Exam: Vital Signs Blood pressure (!) 141/62, pulse 66, temperature 97.8 F (36.6 C), resp. rate 16, height 5\' 2"  (1.575 m), weight 57.6 kg, last menstrual period 07/19/1988, SpO2 100 %. General: Alert, No apparent distress HEENT: Head is normocephalic, atraumatic, PERRLA, EOMI, sclera anicteric, oral mucosa pink and moist, dentition intact, ext ear canals clear,  Neck: Supple without JVD or lymphadenopathy Heart: Reg rate and rhythm. No murmurs rubs or gallops Chest: CTA bilaterally without wheezes, rales, or rhonchi; no distress Abdomen: Soft, non-tender, non-distended, bowel sounds positive. Extremities: No clubbing, cyanosis, or edema. Pulses are 2+  Psych: Flat.  Slowed. Says "I want to be saved" into phone Musc: No edema in extremities.  Left clavicle, shoulder TTP. Neuro: Alert. Left hand tremor while holding phone. +memory deficits, patient aware Delayed Motor: Grossly 4+/5 throughout, left shoulder with mild limitation due to pain, unchanged  Assessment/Plan: 1. Functional deficits secondary to gait disorder, left clavicular fx which require 3+ hours per day of interdisciplinary therapy in a comprehensive inpatient rehab setting.  Physiatrist is providing close team supervision and 24 hour management of active medical problems listed  below.  Physiatrist and rehab team continue to assess barriers to discharge/monitor patient progress toward functional and medical goals  Care Tool:  Bathing    Body parts bathed by patient: Right arm, Left arm, Chest, Abdomen, Face, Front perineal area   Body parts bathed by helper: Front perineal area, Buttocks     Bathing assist Assist Level: Contact Guard/Touching assist     Upper Body Dressing/Undressing Upper body dressing   What is the patient wearing?: Pull over shirt    Upper body assist Assist Level: Minimal Assistance - Patient > 75%    Lower Body Dressing/Undressing Lower body dressing      What is the patient wearing?: Underwear/pull up, Pants     Lower body assist Assist for lower body dressing: Moderate Assistance - Patient 50 - 74%     Toileting Toileting    Toileting assist Assist for toileting: Minimal Assistance - Patient > 75%     Transfers Chair/bed transfer  Transfers assist     Chair/bed transfer assist level: Minimal Assistance - Patient > 75%     Locomotion Ambulation   Ambulation assist      Assist level: Contact Guard/Touching assist Assistive device: Walker-rolling Max distance: 150   Walk 10 feet activity   Assist     Assist level: Minimal Assistance - Patient > 75% Assistive device: Walker-rolling   Walk 50 feet activity   Assist Walk 50 feet with 2 turns activity did not occur: Safety/medical concerns  Assist level: Contact Guard/Touching assist Assistive device: Walker-rolling    Walk 150 feet activity   Assist Walk 150 feet activity did not occur: Safety/medical concerns  Assist level: Contact Guard/Touching assist Assistive device: Walker-rolling  Walk 10 feet on uneven surface  activity   Assist Walk 10 feet on uneven surfaces activity did not occur: Safety/medical concerns   Assist level: Minimal Assistance - Patient > 75% Assistive device: Aeronautical engineer  Will patient use wheelchair at discharge?: No Type of Wheelchair: Manual    Wheelchair assist level: Supervision/Verbal cueing Max wheelchair distance: 2ft    Wheelchair 50 feet with 2 turns activity    Assist            Wheelchair 150 feet activity     Assist          Blood pressure (!) 141/62, pulse 66, temperature 97.8 F (36.6 C), resp. rate 16, height 5\' 2"  (1.575 m), weight 57.6 kg, last menstrual period 07/19/1988, SpO2 100 %.   Medical Problem List and Plan: 1.  Decreased functional mobility secondary to left comminuted clavicle fracture and right knee contusion after fall.  Nonweightbearing.  Follow-up Dr. Mardelle Matte.  Shoulder sling as directed.  Patient was cleared to use left upper extremity for support during ambulation with a walker but no aggressive active range of motion or passive range of motion.  Continue CIR 2.  Antithrombotics: -DVT/anticoagulation: Subcutaneous heparin.  -antiplatelet therapy: N/A 3. Pain Management: Hydrocodone as needed  Local heat/ice to shoulder as needed  Controlled with meds 11/7. Discussed adding voltaren gel but she did not respond when I asked. Clavicle only pains with movement- not at rest.  4. Mood: Zoloft 25 mg daily, Ativan 0.5 mg- wean to q8H prn given fall risk             -antipsychotic agents: N/A 5. Neuropsych: This patient is capable of making decisions on her own behalf. 6. Skin/Wound Care: Routine skin checks 7. Fluids/Electrolytes/Nutrition: Routine in and outs. 8.  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  Lasix 40 mg daily.    Filed Weights   05/23/20 0600 05/24/20 0500 05/25/20 0411  Weight: 58.8 kg 58.5 kg 57.6 kg   Stable 11/7 9.  Parkinson's disease.    Sinemet 25-100 mg 2 tabs 3 times daily 10.  Hypertension.  Lisinopril 2.5 mg daily, Zebeta 2.5 mg daily  Stable 11/7 11.  Hyperlipidemia.  Pravachol 12.  Fusiform thoracic aneurysm ascending aorta,  chronic and stable.  Follow-up outpatient Dr. Darcey Nora 13. Constipation:  Improving 14.  Anemia  Hemoglobin 10.8 on 11/1  Cont to monitor 15.  AKI  Creatinine 0.99 on 11/3, BUN elevated, GFR 57  Encourage fluids   LOS: 8 days A FACE TO FACE EVALUATION WAS PERFORMED  Martha Clan P Kaid Seeberger 05/25/2020, 10:56 AM

## 2020-05-26 ENCOUNTER — Inpatient Hospital Stay (HOSPITAL_COMMUNITY): Payer: Medicare Other

## 2020-05-26 ENCOUNTER — Inpatient Hospital Stay (HOSPITAL_COMMUNITY): Payer: Medicare Other | Admitting: Occupational Therapy

## 2020-05-26 NOTE — Progress Notes (Signed)
Speech Language Pathology Weekly Progress and Session Note  Patient Details  Name: Elizabeth Mccullough MRN: [id number] Date of Birth: 10/10/1940  Beginning of progress report period: May 20, 2020 End of progress report period: May 29, 2020  Today's Date: 05/26/2020 SLP Individual Time: 1215-1230 and [phone] SLP Individual Time Calculation (min): 15 min and 43 min  Short Term Goals: Week 1: SLP Short Term Goal 1 (Week 1): Pt will demonstrate sustained attention and keeping eyes open in 5 minute intervals with min A verbal cues for redirection. SLP Short Term Goal 1 - Progress (Week 1): Met SLP Short Term Goal 2 (Week 1): Pt will demonstrate basic problem solving in ADL tasks with min A verbal cues. SLP Short Term Goal 2 - Progress (Week 1): Met SLP Short Term Goal 3 (Week 1): Pt will verbalize and demonstrate safety awareness during functional tasks with min A verbal cues. SLP Short Term Goal 3 - Progress (Week 1): Met SLP Short Term Goal 4 (Week 1): Pt will increase vocal intensity at phrase level for 90% intelligibility with min A verbal cues. SLP Short Term Goal 4 - Progress (Week 1): Not met    New Short Term Goals: Week 2: SLP Short Term Goal 1 (Week 2): STG=LTG due to short ELOS  Weekly Progress Updates: Pt made progress meeting 3 out 4 goals this reporting period. Pt continues to demonstrate reduced vocal intensity and requires mod A verbal cues to increase volume inconsistently, suggest due to cognitive deficit versus respitory muscle weakness. RMT was initiated to aid in vocal intensity and general awareness of deficit due to it's likely cognitively based nature. Pt demonstrated slow improvements in safety awareness, basic problem solving in ADLs and sustained attention. Pt does continue to be impacted by fatigue limiting therapy participation and exacerbating primary cognitive deficit of sustained attention. SLP downgraded intelligibility goal due to limited carryover of  strategies and will keep a close eye on cognitive goal progress prior to hospital discharge. No education has been completed during this period. Pt would continue to benefit from skilled ST services in order to maximize functional independence and reduce burden of care, requiring supervision at discharge with continued skilled ST services.       Intensity: Minumum of 1-2 x/day, 30 to 90 minutes Frequency: 1 to 3 out of 7 days Duration/Length of Stay: 10-14 days Treatment/Interventions: Cognitive remediation/compensation;Cueing hierarchy;Functional tasks;Internal/external aids;Patient/family education   Daily Session  Skilled Therapeutic Interventions:  #1 Skilled ST services focused on speech and cognitive skills. SLP facilitated basic problem solving and sustained attention in basic card task (WAR), pt required mod A fading to min A verbal cues (1/3 way through task) for problem solving and sustained attention in 7 minute intervals with min A verbal cues. Pt demonstrated reduced sustained attention in informal conversation in 2 minute intervals versus structured tasks. SLP attempted to increase awareness of reduced vocal intensity at phrase level in simple conversation. Pt stated she was aware her "voice is soft" but demonstrated little carryover of maintaining increased vocal intensity despite mod A verbal cuing. Pt requested to use the bathroom and required min A verbal cues for use of safety precautions transferring with RW and mod physical assist transferring from sit to standing with walker (leanig backwards.) Pt was able to urinate and then requested to get into bed, expressing fatigue, after washing hands at the sink. Pt was left in room with call bell within reach and bed alarm set. SLP recommends to continue skilled services.   #  2 Skilled ST services focused on cognitive skills. SLP facilitated basic problem solving and sustained attention with set up of lunch tray. Pt demonstrated ability  to problem solve tray, requesting assistance opening/cutting items with min A verbal cues. Pt demonstrated sustained attention during self-feeding in 10 minute intervals with min A verbal cues for redirection. SLP wrote diet order for set up assist. Pt was left in room with call bell within reach and bed alarm set. SLP recommends to continue skilled services.  General    Pain Pain Assessment Pain Score: 0-No pain  Therapy/Group: Individual Therapy  Lexia Vandevender  [facility] 05/26/2020, 2:16 PM

## 2020-05-26 NOTE — Progress Notes (Signed)
Occupational Therapy Weekly Progress Note  Patient Details  Name: Elizabeth Mccullough MRN: [id number] Date of Birth: [birth year]  Beginning of progress report period: May 18, 2020 End of progress report period: May 26, 2020  Today's Date: 05/26/2020 OT Individual Time: 1001-1056 OT Individual Time Calculation (min): 55 min    Patient has met 3 of 3 short term goals.  Pt is making steady progress towards goals.  Pt currently completes ambulatory transfers with RW with CGA to close supervision.  Pt continues to require cues for NWB through LUE as pt with frequent attempts to push up or pull up on surfaces during sit > stand.  Pt is able to engage in bathing with setup assist for UB, however requiring up to mod assist for LB bathing.  Pt continues to require increased physical assistance for LB dressing due to decreased use of LUE s/p clavicle fracture with threading BLE and especially with pulling pants over hips.  Pt continues to require increased cues and encouragement to attempt tasks as pt frequently reports fatigue and requesting to return to bed.    Patient continues to demonstrate the following deficits: muscle weakness, decreased cardiorespiratoy endurance, impaired timing and sequencing, decreased coordination and decreased motor planning, decreased initiation, decreased attention, decreased awareness, decreased problem solving, decreased safety awareness, decreased memory and delayed processing and decreased sitting balance, decreased standing balance, decreased postural control and difficulty maintaining precautions and therefore will continue to benefit from skilled OT intervention to enhance overall performance with BADL and Reduce care partner burden.  Patient progressing toward long term goals..  Continue plan of care.  OT Short Term Goals Week 1:  OT Short Term Goal 1 (Week 1): Pt will complete 2/3 components of donning pants OT Short Term Goal 1 - Progress (Week 1): Met OT  Short Term Goal 2 (Week 1): Pt will complete 1 grooming task while standing at the sink with no more than Min balance assistance OT Short Term Goal 2 - Progress (Week 1): Met OT Short Term Goal 3 (Week 1): Pt will complete shower transfer with LRAD and Min A OT Short Term Goal 3 - Progress (Week 1): Met Week 2:  OT Short Term Goal 1 (Week 2): STG = LTGs due to remaining LOS  Skilled Therapeutic Interventions/Progress Updates:    Treatment session with focus on functional mobility, transfers, sit > stand, and dynamic standing balance.  Pt received supine in bed asking "didn't you just leave here".  Discussed schedule with pt, reporting PT leaving about 45 mins ago.  Pt much more flat and rigid this session.  Required max encouragement to attempt OOB activity this session.  Therapist discussed current STG and need to assess each today.  Pt agreeable to attempting each.  Pt completed bed mobility to come to sitting at EOB with increased time, bed rails, and supervision - of note, pt does require mod assist to return to supine due to decreased motor planning and difficulty lifting BLE in to bed.  Pt able to doff pants with increased time and effort.  Pt able to don pants with increased time, requiring min assist to advance Lt pant leg over gripper socks.  Pt able to pull pants over hips with CGA for standing balance, however required mod assist sit > stand from EOB due to posterior lean.  Completed stand pivot transfers with RW with CGA, once upright.  Therapist educated pt on completing walk-in shower transfer by stepping backwards over ledge with RW in front.  Pt able to complete sit > stand from w/c with min assist due to arm rests.  Completed simulated shower transfer with stepping over 3" ledge with CGA and min cues for sequencing.  Pt demonstrating increased shuffling gait this session, especially with turns.  Pt returned to room and transferred back to bed with mod assist to lift BLE in to bed.    RN  reports she had provided Norco for headache and was late administering Sinemet this AM, either or both of these could be contributing to increased flat affect and shuffling with gait.  Therapy Documentation Precautions:  Precautions Precautions: Fall Precaution Comments: Per MD, ok to use walker but encourage pt to avoid pushing through L UE too much Required Braces or Orthoses: Sling Restrictions Weight Bearing Restrictions: Yes LUE Weight Bearing: Non weight bearing Other Position/Activity Restrictions: NWB in sling, but ok to use walker without pushing through L UE too much Pain: Pain Assessment Pain Scale: 0-10 Pain Score: 7  Pain Type: Acute pain Pain Location: Head Pain Descriptors / Indicators: Headache Pain Frequency: Intermittent Pain Onset: Gradual Pain Intervention(s): Medication (See eMAR)   Therapy/Group: Individual Therapy  [name] 05/26/2020, 9:52 AM

## 2020-05-26 NOTE — Discharge Instructions (Signed)
Inpatient Rehab Discharge Instructions  Elizabeth Mccullough Discharge date and time: No discharge date for patient encounter.   Activities/Precautions/ Functional Status: Activity: Nonweightbearing left upper extremity Diet: regular diet Wound Care: Routine skin checks Functional status:  ___ No restrictions     ___ Walk up steps independently ___ 24/7 supervision/assistance   ___ Walk up steps with assistance ___ Intermittent supervision/assistance  ___ Bathe/dress independently ___ Walk with walker     _x__ Bathe/dress with assistance ___ Walk Independently    ___ Shower independently ___ Walk with assistance    ___ Shower with assistance ___ No alcohol     ___ Return to work/school ________  Special Instructions: No driving smoking or alcohol     COMMUNITY REFERRALS UPON DISCHARGE:    Outpatient: PT, OT, SP             Agency:[facility]  Phone: [phone]             Appointment Date/Time:WILL SET UP WITH PATIENT AT [place] Equipment/Items Ordered:HAS ALL NEEDED EQUIPMENT                                                  My questions have been answered and I understand these instructions. I will adhere to these goals and the provided educational materials after my discharge from the hospital.  Patient/Caregiver Signature _______________________________ Date __________  Clinician Signature _______________________________________ Date __________  Please bring this form and your medication list with you to all your follow-up doctor's appointments.

## 2020-05-26 NOTE — Plan of Care (Signed)
  Problem: RH Bed Mobility Goal: LTG Patient will perform bed mobility with assist (PT) Description: LTG: Patient will perform bed mobility with assistance, with/without cues (PT). Flowsheets (Taken 05/26/2020 1300) LTG: Pt will perform bed mobility with assistance level of: (downgraded due to poor adherance to WB precautions and generalized weakness) Supervision/Verbal cueing Note: downgraded due to poor adherance to WB precautions and generalized weakness

## 2020-05-26 NOTE — Progress Notes (Signed)
Physical Therapy Session Note  Patient Details  Name: Elizabeth Mccullough MRN: [id number] Date of Birth: 10-Mar-1941  Today's Date: 05/26/2020 PT Individual Time: 0932-6712 and 4580-9983 PT Individual Time Calculation (min): 40 min and 26 min  Short Term Goals: Week 1:  PT Short Term Goal 1 (Week 1): Pt will complete least restrictive transfers with min A consistently PT Short Term Goal 1 - Progress (Week 1): Met PT Short Term Goal 2 (Week 1): Pt will ambulate x 75 ft with LRAD and min A PT Short Term Goal 2 - Progress (Week 1): Met PT Short Term Goal 3 (Week 1): Pt will demonstrate recall of WBing precautions with bed mobility and transfers 50% of the time PT Short Term Goal 3 - Progress (Week 1): Progressing toward goal Week 2:  PT Short Term Goal 1 (Week 2): STG=LTG due to LOS  Skilled Therapeutic Interventions/Progress Updates:  Treatment Session 1: [id number] 40 min Received pt supine in bed asleep, upon wakening pt with mild confusion stating "is it morning?" and "I thought I was all done with therapy." Therapist reoriented pt and informed pt of therapy schedule for the day but noted pt frequently falling asleep. When asked if pt slept well she stated yes but then stated "I'm tired and exhausted." Pt agreeable to therapy with convincing and reported 6/10 pain in LUE. RN notified and present to administer medication during session. Session with emphasis on functional mobility/transfers, generalized strengthening, dynamic standing balance/coordination, ambulation, and improved activity tolerance. Pt transferred supine<>sitting EOB with use of bedrails and CGA. Donned jacket with mod A and pt scooted to [facility] with supervision, increased time, and cues to maintain LUE NWB precautions as pt trying to push on bed with LUE. Donned sling total A and pt transferred bed<>WC stand<>pivot without AD and CGA. Pt sat in [facility] and brushed teeth with min A and combed hair and washed face with supervision. Pt  ambulated >29f with RW and CGA/occasional min A to steer RW. Pt demonstrated narrow BOS, shuffling gait pattern, flexed trunk, and downward gaze and required cues to increase step length and to avoid running into obstacles on R side; however pt with poor carry over. Noted pt continues to freeze when turning to sit. Concluded session with pt sitting in WC, needs within reach, and seatbelt alarm on.   Treatment Session 2: [id number] min Received pt laying supine across the middle of the bed with her head resting on the bedrails and lunch tray in front of her. Appeared as if pt was eating lunch and leaned back too far and was unaware that she was in that position (RN made aware). Therapist encouraged pt to sit upright and get feet flat on floor; pt able to follow instructions with supervision and increased time. Pt agreeable to therapy, and denied any pain during session. Session with emphasis on functional mobility/transfers, generalized strengthening, dynamic standing balance/coordination, and improved activity tolerance. Donned jacket with mod A and sling total A and pt transferred sit<>stand without AD and min A and bed<>WC stand<>pivot without AD and CGA. Pt transferred sit<>stand at sink x 5 trials with CGA/min A. Pt required cues to avoid pulling on sink with LUE and for anterior weight shifting and R hand placement on WC armrests when standing. Pt performed the following exercises standing with BUE support on sink and CGA/min A for balance: -alternating marching 2x12  -mini-squats 2x10 -heel raises 2x12 -hip abduction 2x10 bilaterally  -hip extension 2x10 bilaterally Concluded session with pt sitting  in [facility], needs within reach, and seatbelt alarm on. LUE supported on pillow for comfort.   Therapy Documentation Precautions:  Precautions Precautions: Fall Precaution Comments: Per MD, ok to use walker but encourage pt to avoid pushing through L UE too much Required Braces or Orthoses:  Sling Restrictions Weight Bearing Restrictions: Yes LUE Weight Bearing: Non weight bearing Other Position/Activity Restrictions: NWB in sling, but ok to use walker without pushing through L UE too much   Therapy/Group: Individual Therapy Alfonse Alpers PT, DPT   05/26/2020, 7:25 AM

## 2020-05-26 NOTE — Progress Notes (Signed)
[place] PHYSICAL MEDICINE & REHABILITATION PROGRESS NOTE   Subjective/Complaints:  Pt with sparse verbal output.  Follows basic commands  ROS: Denies CP, SOB, N/V/D  Objective:   No results found. No results for input(s): WBC, HGB, HCT, PLT in the last 72 hours. No results for input(s): NA, K, CL, CO2, GLUCOSE, BUN, CREATININE, CALCIUM in the last 72 hours.  Intake/Output Summary (Last 24 hours) at 05/26/2020 1023 Last data filed at 05/25/2020 1828 Gross per 24 hour  Intake 354 ml  Output --  Net 354 ml        Physical Exam: Vital Signs Blood pressure 133/68, pulse 60, temperature 98.4 F (36.9 C), temperature source Axillary, resp. rate 17, height 5\' 2"  (1.575 m), weight 59.8 kg, last menstrual period 07/19/1988, SpO2 97 %.  General: No acute distress Mood and affect are appropriate Heart: Regular rate and rhythm no rubs murmurs or extra sounds Lungs: Clear to auscultation, breathing unlabored, no rales or wheezes Abdomen: Positive bowel sounds, soft nontender to palpation, nondistended Extremities: No clubbing, cyanosis, or edema Skin: No evidence of breakdown, no evidence of rash  Psych: Flat.  Minimal verbal Musc: No edema in extremities.  Left clavicle, shoulder TTP. Neuro: Alert. Left hand tremor while holding phone. +memory deficits, patient aware Delayed Motor: Grossly 4+/5 throughout, left shoulder with mild limitation due to pain, unchanged  Assessment/Plan: 1. Functional deficits secondary to gait disorder, left clavicular fx which require 3+ hours per day of interdisciplinary therapy in a comprehensive inpatient rehab setting.  Physiatrist is providing close team supervision and 24 hour management of active medical problems listed below.  Physiatrist and rehab team continue to assess barriers to discharge/monitor patient progress toward functional and medical goals  Care Tool:  Bathing    Body parts bathed by patient: Right arm, Left arm, Chest,  Abdomen, Face, Front perineal area   Body parts bathed by helper: Front perineal area, Buttocks     Bathing assist Assist Level: Contact Guard/Touching assist     Upper Body Dressing/Undressing Upper body dressing   What is the patient wearing?: Pull over shirt    Upper body assist Assist Level: Minimal Assistance - Patient > 75%    Lower Body Dressing/Undressing Lower body dressing      What is the patient wearing?: Underwear/pull up, Pants     Lower body assist Assist for lower body dressing: Moderate Assistance - Patient 50 - 74%     Toileting Toileting    Toileting assist Assist for toileting: Minimal Assistance - Patient > 75%     Transfers Chair/bed transfer  Transfers assist     Chair/bed transfer assist level: Contact Guard/Touching assist     Locomotion Ambulation   Ambulation assist      Assist level: Contact Guard/Touching assist Assistive device: Walker-rolling Max distance: >268ft   Walk 10 feet activity   Assist     Assist level: Contact Guard/Touching assist Assistive device: Walker-rolling   Walk 50 feet activity   Assist Walk 50 feet with 2 turns activity did not occur: Safety/medical concerns  Assist level: Contact Guard/Touching assist Assistive device: Walker-rolling    Walk 150 feet activity   Assist Walk 150 feet activity did not occur: Safety/medical concerns  Assist level: Contact Guard/Touching assist Assistive device: Walker-rolling    Walk 10 feet on uneven surface  activity   Assist Walk 10 feet on uneven surfaces activity did not occur: Safety/medical concerns   Assist level: Minimal Assistance - Patient > 75% Assistive device:  Walker-rolling   Wheelchair     Assist Will patient use wheelchair at discharge?: No Type of Wheelchair: Manual    Wheelchair assist level: Supervision/Verbal cueing Max wheelchair distance: 16ft    Wheelchair 50 feet with 2 turns activity    Assist             Wheelchair 150 feet activity     Assist          Blood pressure 133/68, pulse 60, temperature 98.4 F (36.9 C), temperature source Axillary, resp. rate 17, height 5\' 2"  (1.575 m), weight 59.8 kg, last menstrual period 07/19/1988, SpO2 97 %.   Medical Problem List and Plan: 1.  Decreased functional mobility secondary to left comminuted clavicle fracture and right knee contusion after fall.  Nonweightbearing.  Follow-up Dr. Mardelle Matte.  Shoulder sling as directed.  Patient was cleared to use left upper extremity for support during ambulation with a walker but no aggressive active range of motion or passive range of motion.  Continue CIR PT, OT, SLP 2.  Antithrombotics: -DVT/anticoagulation: Subcutaneous heparin.  -antiplatelet therapy: N/A 3. Pain Management: Hydrocodone as needed  Local heat/ice to shoulder as needed  Controlled with meds 11/7. Discussed adding voltaren gel but she did not respond when I asked. Appears confortable turning in bed 4. Mood: Zoloft 25 mg daily, Ativan 0.5 mg- wean to q8H prn given fall risk             -antipsychotic agents: N/A 5. Neuropsych: This patient is capable of making decisions on her own behalf. 6. Skin/Wound Care: Routine skin checks 7. Fluids/Electrolytes/Nutrition: Routine in and outs. 8.  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  Lasix 40 mg daily.    Filed Weights   05/24/20 0500 05/25/20 0411 05/26/20 0321  Weight: 58.5 kg 57.6 kg 59.8 kg   Stable 11/8 9.  Parkinson's disease.    Sinemet 25-100 mg 2 tabs 3 times daily 10.  Hypertension.  Lisinopril 2.5 mg daily, Zebeta 2.5 mg daily   Vitals:   05/25/20 1949 05/26/20 0321  BP: (!) 104/57 133/68  Pulse: (!) 59 60  Resp: 17 17  Temp: 97.7 F (36.5 C) 98.4 F (36.9 C)  SpO2: 100% 97%   11.  Hyperlipidemia.  Pravachol 12.  Fusiform thoracic aneurysm ascending aorta, chronic and stable.  Follow-up outpatient Dr. Darcey Nora 13.  Constipation:  Improving 14.  Anemia  Hemoglobin 10.8 on 11/1  Cont to monitor 15.  AKI  Creatinine 0.99 on 11/3, BUN elevated, GFR 57  Encourage fluids   LOS: 9 days A FACE TO FACE EVALUATION WAS PERFORMED  Charlett Blake 05/26/2020, 10:23 AM

## 2020-05-26 NOTE — Plan of Care (Signed)
  Problem: RH Expression Communication Goal: LTG Patient will increase speech intelligibility (SLP) Description: LTG: Patient will increase speech intelligibility at word/phrase/conversation level with cues, % of the time (SLP) Flowsheets Taken 05/26/2020 1423 LTG: Patient will increase speech intelligibility (SLP): (downgraded due to limited carryover and awareness) Minimal Assistance - Patient > 75% Percent of time patient will use intelligible speech: 70% Taken 05/20/2020 1048 Level: Phrase Note: Downgraded due to limited carryover and awareness

## 2020-05-26 NOTE — Progress Notes (Signed)
Physical Therapy Weekly Progress Note  Patient Details  Name: Elizabeth Mccullough MRN: [id number] Date of Birth: Jul 16, 1941  Beginning of progress report period: May 18, 2020 End of progress report period: May 26, 2020  Patient has met 2 of 3 short term goals. Pt demonstrates gradual progress towards long term goals. Pt currently requires CGA for bed mobility, CGA for transfers with RW, CGA/min A to ambulate >285f with RW, min A to navigate 8 steps with 1 handrail, and supervision for WC mobility up to 333f Pt continues to demonstrate difficulty with adherence to LUE NWB precautions, decreased safety awareness and insight into deficits, generalized weakness, and abnormal gait pattern and difficulty with turning during transfers due to Parkinson's symptoms.   Patient continues to demonstrate the following deficits muscle weakness, impaired timing and sequencing, unbalanced muscle activation, decreased coordination and decreased motor planning and decreased standing balance, decreased postural control, decreased balance strategies and difficulty maintaining precautions and therefore will continue to benefit from skilled PT intervention to increase functional independence with mobility.  Patient progressing toward long term goals..  Continue plan of care.  PT Short Term Goals Week 1:  PT Short Term Goal 1 (Week 1): Pt will complete least restrictive transfers with min A consistently PT Short Term Goal 1 - Progress (Week 1): Met PT Short Term Goal 2 (Week 1): Pt will ambulate x 75 ft with LRAD and min A PT Short Term Goal 2 - Progress (Week 1): Met PT Short Term Goal 3 (Week 1): Pt will demonstrate recall of WBing precautions with bed mobility and transfers 50% of the time PT Short Term Goal 3 - Progress (Week 1): Progressing toward goal Week 2:  PT Short Term Goal 1 (Week 2): STG=LTG due to LOS  Skilled Therapeutic Interventions/Progress Updates:  Ambulation/gait  training;Balance/vestibular training;Cognitive remediation/compensation;Community reintegration;Discharge planning;Disease management/prevention;DME/adaptive equipment instruction;Functional mobility training;Neuromuscular re-education;Pain management;Patient/family education;Splinting/orthotics;Therapeutic Activities;Therapeutic Exercise;UE/LE Strength taining/ROM;UE/LE Coordination activities   Therapy Documentation Precautions:  Precautions Precautions: Fall Precaution Comments: Per MD, ok to use walker but encourage pt to avoid pushing through L UE too much Required Braces or Orthoses: Sling Restrictions Weight Bearing Restrictions: Yes LUE Weight Bearing: Non weight bearing Other Position/Activity Restrictions: NWB in sling, but ok to use walker without pushing through L UE too much  Therapy/Group: Individual Therapy AnAlfonse AlpersT, DPT  05/26/2020, 7:23 AM

## 2020-05-27 ENCOUNTER — Inpatient Hospital Stay (HOSPITAL_COMMUNITY): Payer: Medicare Other

## 2020-05-27 ENCOUNTER — Inpatient Hospital Stay (HOSPITAL_COMMUNITY): Payer: Medicare Other | Admitting: Occupational Therapy

## 2020-05-27 LAB — GLUCOSE, CAPILLARY: Glucose-Capillary: 89 mg/dL (ref 70–99)

## 2020-05-27 MED ORDER — LORAZEPAM 0.5 MG PO TABS
0.2500 mg | ORAL_TABLET | Freq: Three times a day (TID) | ORAL | Status: DC | PRN
  Administered 2020-05-27 – 2020-05-28 (×2): 0.25 mg via ORAL
  Filled 2020-05-27 (×3): qty 1

## 2020-05-27 NOTE — Progress Notes (Signed)
Occupational Therapy Session Note  Patient Details  Name: Elizabeth Mccullough MRN: [id number] Date of Birth: 1941/02/11  Today's Date: 05/27/2020 OT Individual Time: 6063-0160 OT Individual Time Calculation (min): 56 min    Short Term Goals: Week 2:  OT Short Term Goal 1 (Week 2): STG = LTGs due to remaining LOS  Skilled Therapeutic Interventions/Progress Updates:    Treatment session with focus on self-care retraining, sit > stand, dynamic standing balance, and NWB through LUE during sit <> stand.  Pt received supine in bed, reporting "it's morning?" when therapist greeted her.  Pt agreeable to engage in self-care tasks with mod encouragement.  Pt completed bed mobility with CGA to close supervision with cues for NWB through LUE with bed mobility.  Pt completed sit > stand with CGA progressing to close supervision this session, although pt frequently attempting to pull up on RW requiring cues for appropriate hand placement.  Pt ambulated to toilet with RW with improved gait speed this session.  Pt completed clothing management and hygiene with toileting tasks with close supervision.  Pt completed bathing and dressing at sit > stand level at sink with improvements in sequencing and problem solving this session, completing LB dressing with CGA.  Pt continues to require cues for standing balance and NWB through LUE during sit > stand.  Pt completed oral care in sitting with setup.  Pt agreeable to remaining upright after session.  Pt seated in w/c with seat belt alarm on and all needs in reach.  Notified MD and [facility] regarding concerns about upcoming d/c and fluctuations in pt progress. 11/8 Pt required mod assist for sit > stand and max multimodal cues for all mobility and self-care tasks to adhere to NWB of LUE and improve initiation and sequencing of self-care tasks.  Pt continues to fluctuate and requires frequent cues for safety due to posterior lean and attempts to utilize LUE during sit <>  stand.  Therapy Documentation Precautions:  Precautions Precautions: Fall Precaution Comments: Per MD, ok to use walker but encourage pt to avoid pushing through L UE too much Required Braces or Orthoses: Sling Restrictions Weight Bearing Restrictions: Yes LUE Weight Bearing: Partial weight bearing Other Position/Activity Restrictions: NWB in sling, but ok to use walker without pushing through L UE too much Pain:  Pt with no c/o pain   Therapy/Group: Individual Therapy  [name] 05/27/2020, 12:07 PM

## 2020-05-27 NOTE — Progress Notes (Signed)
Patient ID: Elizabeth Mccullough, female   DOB: 1941/01/09, 79 y.o.   MRN: [id number] Therapy team concerned pt is not doing as well and requiring more assistance in therapies. Contacted daughter-kelly to discuss if hiring 24/7 at discharge. She reports she will have some assistance but not 24 hr, this is the reason she came to rehab. She asked for an extension for more therapies and to not be as high of a fall risk otherwise she will be right back in the hospital. MD made aware of asking for extension. Spoke with Anderson Malta at [facility] to ask the process for asking for extension she reports send updated notes from MD and therapists to fax number [phone] and Larene Beach will get and look at and take to the Medical Director to review and decide if warranted. Have faxed records will await decision of [employer]. Will inform kelly once decision made.

## 2020-05-27 NOTE — Discharge Summary (Signed)
Physician Discharge Summary  Patient ID: Elizabeth Mccullough MRN: [id number] DOB/AGE: 11-19-1940 79 y.o.  Admit date: 05/17/2020 Discharge date: 05/31/2020  Discharge Diagnoses:  Principal Problem:   Clavicle fracture Active Problems:   Anemia   Essential hypertension   Parkinson's disease ([place])   Chronic combined systolic and diastolic congestive heart failure (HCC)   Clavicle pain   AKI (acute kidney injury) ([place])   Chronic diastolic congestive heart failure (HCC) DVT prophylaxis Hyperlipidemia Fusiform thoracic aneurysm ascending aorta  Discharged Condition: Stable  Significant Diagnostic Studies: DG Chest 2 View  Result Date: 05/13/2020 CLINICAL DATA:  79 year old female with left rib pain. EXAM: CHEST - 2 VIEW COMPARISON:  Chest radiograph dated 11/27/2019. FINDINGS: There is blunting of the left costophrenic angle with flattening of the left hemidiaphragm which may represent trace left pleural effusion and left lung base atelectasis. Pneumonia is not excluded clinical correlation is recommended. The right lung is clear. No pneumothorax. Mild cardiomegaly. There is widening of the upper mediastinum similar to prior radiograph. Atherosclerotic calcification of the aorta. No acute osseous pathology. IMPRESSION: Probable trace left pleural effusion and left lung base atelectasis. Pneumonia is not excluded. Electronically Signed   By: Anner Crete M.D.   On: 05/13/2020 18:51   DG Clavicle Left  Result Date: 05/13/2020 CLINICAL DATA:  Fall, clavicular pain EXAM: LEFT CLAVICLE - 2+ VIEWS COMPARISON:  Chest radiograph 05/13/2020 FINDINGS: Fracture of the distal third left clavicle with slight inferior displacement of 1/3 shaft width. Minimal foreshortening across the fracture line. No other acute osseous abnormality is seen. Overlying soft tissue swelling. Acromioclavicular and glenohumeral alignment appears maintained. Background of moderate degenerative change at the  acromioclavicular and glenohumeral joints. Mineralization seen about the inferior glenohumeral recess may reflect small joint body. IMPRESSION: 1. Fracture of the distal third left clavicle with slight inferior displacement of 1/3 shaft width. Associated swelling. 2. Moderate degenerative change of the acromioclavicular and glenohumeral joints. Electronically Signed   By: Lovena Le M.D.   On: 05/13/2020 19:36   CT Head Wo Contrast  Result Date: 05/13/2020 CLINICAL DATA:  79 year old female with head trauma. EXAM: CT HEAD WITHOUT CONTRAST CT CERVICAL SPINE WITHOUT CONTRAST TECHNIQUE: Multidetector CT imaging of the head and cervical spine was performed following the standard protocol without intravenous contrast. Multiplanar CT image reconstructions of the cervical spine were also generated. COMPARISON:  CT dated 04/30/2020. FINDINGS: CT HEAD FINDINGS Brain: Mild age-related atrophy and chronic microvascular ischemic changes. There is no acute intracranial hemorrhage. No mass effect midline shift. No extra-axial fluid collection. Vascular: No hyperdense vessel or unexpected calcification. Skull: Normal. Negative for fracture or focal lesion. Sinuses/Orbits: No acute finding. Other: None CT CERVICAL SPINE FINDINGS Alignment: No acute subluxation. Skull base and vertebrae: No acute cervical spine fracture. Osteopenia. Soft tissues and spinal canal: No prevertebral fluid or swelling. No visible canal hematoma. Disc levels: Multilevel degenerative changes with endplate irregularity and disc space narrowing. Multilevel facet arthropathy. Upper chest: Negative. Other: Partially visualized comminuted and displaced fracture of the left clavicle. IMPRESSION: 1. No acute intracranial pathology. Mild age-related atrophy and chronic microvascular ischemic changes. 2. No acute/traumatic cervical spine pathology. Multilevel degenerative changes. 3. Partially visualized comminuted and displaced fracture of the left  clavicle. Electronically Signed   By: Anner Crete M.D.   On: 05/13/2020 19:38   CT Head Wo Contrast  Result Date: 04/30/2020 CLINICAL DATA:  Head trauma, minor. Neck trauma. Additional history provided: Fall. EXAM: CT HEAD WITHOUT CONTRAST CT CERVICAL SPINE WITHOUT CONTRAST TECHNIQUE:  Multidetector CT imaging of the head and cervical spine was performed following the standard protocol without intravenous contrast. Multiplanar CT image reconstructions of the cervical spine were also generated. COMPARISON:  Head CT 11/25/2019. Radiographs of the cervical spine 07/16/2019. CT of the cervical spine 04/28/2018. FINDINGS: CT HEAD FINDINGS Brain: Generalized cerebral atrophy. Ill-defined hypoattenuation within the cerebral white matter which is nonspecific, but consistent with chronic small vessel ischemic disease. These findings are unchanged as compared to the head CT of 11/25/2019. There is no acute intracranial hemorrhage. No demarcated cortical infarct. No extra-axial fluid collection. No evidence of intracranial mass. No midline shift. Vascular: No hyperdense vessel.  Atherosclerotic calcifications. Skull: Normal. Negative for fracture or focal lesion. Sinuses/Orbits: Visualized orbits show no acute finding. Frontal scalp/forehead soft tissue swelling/hematoma. Minimal ethmoid sinus mucosal thickening at the imaged levels. No significant mastoid effusion. CT CERVICAL SPINE FINDINGS Alignment: Straightening of the expected cervical lordosis. No significant spondylolisthesis. Skull base and vertebrae: The basion-dental and atlanto-dental intervals are maintained.No evidence of acute fracture to the cervical spine. Redemonstrated mild chronic T2 vertebral body compression fracture and mild chronic T3 superior endplate deformity. Soft tissues and spinal canal: No prevertebral fluid or swelling. No visible canal hematoma. Disc levels: Cervical spondylosis with multilevel disc space narrowing, disc bulges,  posterior disc osteophytes, uncovertebral hypertrophy and facet arthrosis. Disc space narrowing is severe at C4-C5 and C5-C6. No high-grade bony spinal canal stenosis. Multilevel bony neural foraminal narrowing greatest bilaterally at C4-C5 and C5-C6. There is fusion across the C2-C3 posterior disc space and bilateral facet joints. There is also fusion across the C4-C5 facet joints. Upper chest: No consolidation within the imaged lung apices. No visible pneumothorax. Other: 16 mm nodule within the right thyroid lobe. Non acute, nondisplaced fracture deformity of the posterior right fourth rib (series 7, image 90). IMPRESSION: CT head: 1. No evidence of acute intracranial abnormality. 2. Frontal scalp and forehead soft tissue swelling/hematoma. 3. Generalized cerebral atrophy and chronic small vessel ischemic disease, stable as compared to the head CT of 11/25/2019. 4. Minimal ethmoid sinus mucosal thickening. CT cervical spine: 1. No evidence of acute fracture to the cervical spine. 2. Cervical spondylosis as described. 3. Redemonstrated mild chronic T2 vertebral body compression fracture and mild chronic T3 superior endplate deformity. 4. Non-acute, nondisplaced fracture of the posterior right fourth rib. 5. 16 mm right thyroid lobe nodule. Nonemergent thyroid ultrasound is recommended for further evaluation. Electronically Signed   By: Kellie Simmering DO   On: 04/30/2020 14:46   CT Cervical Spine Wo Contrast  Result Date: 05/13/2020 CLINICAL DATA:  79 year old female with head trauma. EXAM: CT HEAD WITHOUT CONTRAST CT CERVICAL SPINE WITHOUT CONTRAST TECHNIQUE: Multidetector CT imaging of the head and cervical spine was performed following the standard protocol without intravenous contrast. Multiplanar CT image reconstructions of the cervical spine were also generated. COMPARISON:  CT dated 04/30/2020. FINDINGS: CT HEAD FINDINGS Brain: Mild age-related atrophy and chronic microvascular ischemic changes. There is  no acute intracranial hemorrhage. No mass effect midline shift. No extra-axial fluid collection. Vascular: No hyperdense vessel or unexpected calcification. Skull: Normal. Negative for fracture or focal lesion. Sinuses/Orbits: No acute finding. Other: None CT CERVICAL SPINE FINDINGS Alignment: No acute subluxation. Skull base and vertebrae: No acute cervical spine fracture. Osteopenia. Soft tissues and spinal canal: No prevertebral fluid or swelling. No visible canal hematoma. Disc levels: Multilevel degenerative changes with endplate irregularity and disc space narrowing. Multilevel facet arthropathy. Upper chest: Negative. Other: Partially visualized comminuted and displaced fracture of  the left clavicle. IMPRESSION: 1. No acute intracranial pathology. Mild age-related atrophy and chronic microvascular ischemic changes. 2. No acute/traumatic cervical spine pathology. Multilevel degenerative changes. 3. Partially visualized comminuted and displaced fracture of the left clavicle. Electronically Signed   By: Anner Crete M.D.   On: 05/13/2020 19:38   CT Cervical Spine Wo Contrast  Result Date: 04/30/2020 CLINICAL DATA:  Head trauma, minor. Neck trauma. Additional history provided: Fall. EXAM: CT HEAD WITHOUT CONTRAST CT CERVICAL SPINE WITHOUT CONTRAST TECHNIQUE: Multidetector CT imaging of the head and cervical spine was performed following the standard protocol without intravenous contrast. Multiplanar CT image reconstructions of the cervical spine were also generated. COMPARISON:  Head CT 11/25/2019. Radiographs of the cervical spine 07/16/2019. CT of the cervical spine 04/28/2018. FINDINGS: CT HEAD FINDINGS Brain: Generalized cerebral atrophy. Ill-defined hypoattenuation within the cerebral white matter which is nonspecific, but consistent with chronic small vessel ischemic disease. These findings are unchanged as compared to the head CT of 11/25/2019. There is no acute intracranial hemorrhage. No  demarcated cortical infarct. No extra-axial fluid collection. No evidence of intracranial mass. No midline shift. Vascular: No hyperdense vessel.  Atherosclerotic calcifications. Skull: Normal. Negative for fracture or focal lesion. Sinuses/Orbits: Visualized orbits show no acute finding. Frontal scalp/forehead soft tissue swelling/hematoma. Minimal ethmoid sinus mucosal thickening at the imaged levels. No significant mastoid effusion. CT CERVICAL SPINE FINDINGS Alignment: Straightening of the expected cervical lordosis. No significant spondylolisthesis. Skull base and vertebrae: The basion-dental and atlanto-dental intervals are maintained.No evidence of acute fracture to the cervical spine. Redemonstrated mild chronic T2 vertebral body compression fracture and mild chronic T3 superior endplate deformity. Soft tissues and spinal canal: No prevertebral fluid or swelling. No visible canal hematoma. Disc levels: Cervical spondylosis with multilevel disc space narrowing, disc bulges, posterior disc osteophytes, uncovertebral hypertrophy and facet arthrosis. Disc space narrowing is severe at C4-C5 and C5-C6. No high-grade bony spinal canal stenosis. Multilevel bony neural foraminal narrowing greatest bilaterally at C4-C5 and C5-C6. There is fusion across the C2-C3 posterior disc space and bilateral facet joints. There is also fusion across the C4-C5 facet joints. Upper chest: No consolidation within the imaged lung apices. No visible pneumothorax. Other: 16 mm nodule within the right thyroid lobe. Non acute, nondisplaced fracture deformity of the posterior right fourth rib (series 7, image 90). IMPRESSION: CT head: 1. No evidence of acute intracranial abnormality. 2. Frontal scalp and forehead soft tissue swelling/hematoma. 3. Generalized cerebral atrophy and chronic small vessel ischemic disease, stable as compared to the head CT of 11/25/2019. 4. Minimal ethmoid sinus mucosal thickening. CT cervical spine: 1. No  evidence of acute fracture to the cervical spine. 2. Cervical spondylosis as described. 3. Redemonstrated mild chronic T2 vertebral body compression fracture and mild chronic T3 superior endplate deformity. 4. Non-acute, nondisplaced fracture of the posterior right fourth rib. 5. 16 mm right thyroid lobe nodule. Nonemergent thyroid ultrasound is recommended for further evaluation. Electronically Signed   By: Kellie Simmering DO   On: 04/30/2020 14:46   DG Knee Complete 4 Views Right  Result Date: 05/13/2020 CLINICAL DATA:  Fall right-sided knee pain with bruising EXAM: RIGHT KNEE - COMPLETE 4+ VIEW COMPARISON:  08/24/2019 FINDINGS: No acute displaced fracture or malalignment. Moderate tricompartment arthritis of the knee. No significant effusion IMPRESSION: No acute osseous abnormality. Electronically Signed   By: Donavan Foil M.D.   On: 05/13/2020 18:52   DG Hand Complete Right  Result Date: 04/30/2020 CLINICAL DATA:  Fall, pain and swelling EXAM: RIGHT HAND -  COMPLETE 3+ VIEW COMPARISON:  None. FINDINGS: Alignment is anatomic. No acute fracture. There are degenerative changes at the first carpometacarpal joint and to a lesser extent interphalangeal joints. IMPRESSION: No acute fracture. Electronically Signed   By: Macy Mis M.D.   On: 04/30/2020 14:34   ECHOCARDIOGRAM COMPLETE  Result Date: 05/14/2020    ECHOCARDIOGRAM REPORT   Patient Name:   JANUARY BERGTHOLD Date of Exam: 05/14/2020 Medical Rec #:  [id number]            Height:       62.0 in Accession #:    [id number]           Weight:       130.0 lb Date of Birth:  1940-12-27            BSA:          1.592 m Patient Age:    58 years             BP:           133/62 mmHg Patient Gender: F                    HR:           73 bpm. Exam Location:  Inpatient Procedure: 2D Echo Indications:    syncope 780.2  History:        Patient has prior history of Echocardiogram examinations, most                 recent 08/11/2017. Abdominal aortic aneurysm;  Risk                 Factors:Dyslipidemia.  Sonographer:    Johny Chess Referring Phys: [id number] AMY N [facility] IMPRESSIONS  1. Left ventricular ejection fraction, by estimation, is 30 to 35%. The left ventricle has moderately decreased function. The left ventricle demonstrates regional wall motion abnormalities (see scoring diagram/findings for description). Left ventricular  diastolic parameters are consistent with Grade I diastolic dysfunction (impaired relaxation). There is moderate hypokinesis of the left ventricular, entire anteroseptal wall, inferoseptal wall and inferior wall. Hypokinesis of the septal/inferior walls,  preserved at apex.  2. Right ventricular systolic function is normal. The right ventricular size is normal. There is normal pulmonary artery systolic pressure.  3. Left atrial size was severely dilated.  4. The mitral valve is normal in structure. Mild mitral valve regurgitation.  5. The aortic valve is tricuspid. There is mild calcification of the aortic valve. There is mild thickening of the aortic valve. Aortic valve regurgitation is moderate to severe. Mild to moderate aortic valve sclerosis/calcification is present, without any evidence of aortic stenosis. Comparison(s): Prior images reviewed side by side. Changes from prior study are noted. Conclusion(s)/Recommendation(s): I reviewed 2019 study side by side. On my read, there was mild-moderate LVEF reduction with mild hypokinesis of the septal and inferior walls in 2019. These abnormalities are now more prominent/severe on current study. Aortic regurgitation moderate-severe. Findings communicated with attending Dr. [place] at 1:44 PM via secure chat. FINDINGS  Left Ventricle: Left ventricular ejection fraction, by estimation, is 30 to 35%. The left ventricle has moderately decreased function. The left ventricle demonstrates regional wall motion abnormalities. Moderate hypokinesis of the left ventricular, entire anteroseptal wall,  inferoseptal wall and inferior wall. The left ventricular internal cavity size was normal in size. There is no left ventricular hypertrophy. Abnormal (paradoxical) septal motion, consistent with left bundle branch block. Left ventricular diastolic parameters are consistent with  Grade I diastolic dysfunction (impaired relaxation).  LV Wall Scoring: Hypokinesis of the septal/inferior walls, preserved at apex. Right Ventricle: The right ventricular size is normal. Right vetricular wall thickness was not well visualized. Right ventricular systolic function is normal. There is normal pulmonary artery systolic pressure. The tricuspid regurgitant velocity is 2.79 m/s, and with an assumed right atrial pressure of 3 mmHg, the estimated right ventricular systolic pressure is 83.4 mmHg. Left Atrium: Left atrial size was severely dilated. Right Atrium: Right atrial size was normal in size. Pericardium: There is no evidence of pericardial effusion. Mitral Valve: The mitral valve is normal in structure. Mild mitral valve regurgitation. Tricuspid Valve: The tricuspid valve is normal in structure. Tricuspid valve regurgitation is mild . No evidence of tricuspid stenosis. Aortic Valve: The aortic valve is tricuspid. There is mild calcification of the aortic valve. There is mild thickening of the aortic valve. Aortic valve regurgitation is moderate to severe. Aortic regurgitation PHT measures 266 msec. Mild to moderate aortic valve sclerosis/calcification is present, without any evidence of aortic stenosis. Pulmonic Valve: The pulmonic valve was not well visualized. Pulmonic valve regurgitation is trivial. No evidence of pulmonic stenosis. Aorta: The aortic root, ascending aorta, aortic arch and descending aorta are all structurally normal, with no evidence of dilitation or obstruction. IAS/Shunts: The atrial septum is grossly normal.  LEFT VENTRICLE PLAX 2D LVIDd:         4.90 cm      Diastology LVIDs:         3.50 cm      LV e'  medial:    4.79 cm/s LV PW:         1.00 cm      LV E/e' medial:  15.1 LV IVS:        1.00 cm      LV e' lateral:   5.66 cm/s LVOT diam:     1.80 cm      LV E/e' lateral: 12.8 LV SV:         51 LV SV Index:   32 LVOT Area:     2.54 cm  LV Volumes (MOD) LV vol d, MOD A2C: 120.0 ml LV vol d, MOD A4C: 104.0 ml LV vol s, MOD A2C: 70.7 ml LV vol s, MOD A4C: 79.7 ml LV SV MOD A2C:     49.3 ml LV SV MOD A4C:     104.0 ml LV SV MOD BP:      38.3 ml RIGHT VENTRICLE             IVC RV S prime:     14.00 cm/s  IVC diam: 1.10 cm TAPSE (M-mode): 2.3 cm LEFT ATRIUM             Index       RIGHT ATRIUM           Index LA diam:        2.90 cm 1.82 cm/m  RA Area:     14.60 cm LA Vol (A2C):   80.8 ml 50.76 ml/m RA Volume:   34.20 ml  21.48 ml/m LA Vol (A4C):   84.0 ml 52.77 ml/m LA Biplane Vol: 86.3 ml 54.21 ml/m  AORTIC VALVE LVOT Vmax:   115.00 cm/s LVOT Vmean:  72.500 cm/s LVOT VTI:    0.199 m AI PHT:      266 msec  AORTA Ao Root diam: 2.90 cm MITRAL VALVE  TRICUSPID VALVE MV Area (PHT): 3.42 cm     TR Peak grad:   31.1 mmHg MV Decel Time: 222 msec     TR Vmax:        279.00 cm/s MV E velocity: 72.40 cm/s MV A velocity: 115.00 cm/s  SHUNTS MV E/A ratio:  0.63         Systemic VTI:  0.20 m                             Systemic Diam: 1.80 cm Buford Dresser MD Electronically signed by Buford Dresser MD Signature Date/Time: 05/14/2020/1:44:42 PM    Final    DG Hip Unilat W or Wo Pelvis 1 View Right  Result Date: 05/13/2020 CLINICAL DATA:  79 year old female with right hip pain. Fall. EXAM: DG HIP (WITH OR WITHOUT PELVIS) 1V RIGHT COMPARISON:  Right hip radiograph dated 04/30/2020. FINDINGS: There is no acute fracture or dislocation. The bones are osteopenic. Moderate bilateral hip osteoarthritic changes. There is degenerative changes of the lower lumbar spine. The soft tissues are unremarkable. IMPRESSION: Negative. Electronically Signed   By: Anner Crete M.D.   On: 05/13/2020 18:59   DG  Hip Unilat W or Wo Pelvis 2-3 Views Right  Result Date: 04/30/2020 CLINICAL DATA:  Fall EXAM: DG HIP (WITH OR WITHOUT PELVIS) 2-3V RIGHT COMPARISON:  None. FINDINGS: There is alignment at the right hip. No acute fracture. Changes of osteoarthritis. IMPRESSION: No acute fracture. Electronically Signed   By: Macy Mis M.D.   On: 04/30/2020 14:40    Labs:  Basic Metabolic Panel: No results for input(s): NA, K, CL, CO2, GLUCOSE, BUN, CREATININE, CALCIUM, MG, PHOS in the last 168 hours.  CBC: Recent Labs  Lab 05/28/20 0414  WBC 5.5  NEUTROABS 3.1  HGB 10.6*  HCT 33.0*  MCV 99.1  PLT 318    CBG: Recent Labs  Lab 05/27/20 1631  GLUCAP 85    Family history.  Mother with CAD Sister and brother with hyperlipidemia Sister with diabetes as well as colon cancer and breast cancer.  Negative for esophageal cancer or rectal cancer  Brief HPI:   Elizabeth Mccullough is a 79 y.o. right-handed female with history of hypertension Parkinson's disease maintained on Sinemet combined chronic systolic congestive diastolic heart failure followed by Dr. Acie Fredrickson, hyperlipidemia fusiform ascending aneurysm, mild carotid disease moderate aortic insufficiency and mild dilatation of aortic root.  Per chart review resides independent living [place] since March 2021.  Ambulates with a rolling walker but does endorse frequent falls.  She was able to bathe dress and feed herself and goes to the dining hall for meals.  Daughter and family plan to hire assistance as needed.  Presented 05/13/2020 after falling 3 times on day of admission.  Denied loss of consciousness.  Admission chemistries glucose 100 troponin 26- 29, hemoglobin 10.6 urinalysis negative nitrite.  Cranial CT scan as well as CT cervical spine showed no acute intracranial pathology and no acute traumatic cervical spine pathology.  X-rays right knee no acute osseous abnormality.  Findings of comminuted displaced left clavicle fracture.  Follow-up  orthopedic service Dr. Mardelle Matte in regards to left clavicle fracture also right knee contusion with conservative care no surgical intervention.  Nonweightbearing left upper extremity with shoulder sling.  Cardiology service follow-up for history of CHF with latest chest x-ray showing probable left pleural effusion and left lung base atelectasis echocardiogram completed showing ejection fraction 30 to 35%  grade 1 diastolic dysfunction moderate hypokinesis LV.  Subcutaneous heparin for DVT prophylaxis.  Therapy evaluations completed and patient was admitted for a comprehensive rehab program   Hospital Course: DARLYS BUIS was admitted to rehab 05/17/2020 for inpatient therapies to consist of PT, ST and OT at least three hours five days a week. Past admission physiatrist, therapy team and rehab RN have worked together to provide customized collaborative inpatient rehab.  Pertaining to patient's left comminuted clavicle fracture and right knee contusion after a fall remain nonweightbearing left upper extremity she would follow-up orthopedic service Dr. Mardelle Matte shoulder sling as directed.  Patient was cleared to use left upper extremity for support during ambulation with a walker but no aggressive active range of motion or passive range of motion.  Subcutaneous heparin for DVT prophylaxis no bleeding episodes.  Pain management with use of hydrocodone as needed.  Mood stabilization with Zoloft scheduled emotional support provided.  Chronic combined diastolic congestive heart failure nonischemic cardiomyopathy followed by cardiology services exhibited no signs of fluid overload she remained on diuretic.  She did have a history of Parkinson's disease maintained on Sinemet as directed.  Blood pressure controlled overall with lisinopril as well as Zebeta.  Pravachol for hyperlipidemia.  She did have a history of fusiform thoracic aneurysm ascending aorta followed outpatient by Dr. Darcey Nora.  She had no chest pain or  shortness of breath throughout her hospital stay.   Blood pressures were monitored on TID basis and controlled      Rehab course: During patient's stay in rehab weekly team conferences were held to monitor patient's progress, set goals and discuss barriers to discharge. At admission, patient required minimal assist 30 feet IV pole minimal assist stand pivot transfers minimal assist sit to stand.  Moderate assist upper body bathing max is lower body bathing moderate assist upper body dressing max is lower body dressing  Physical exam.  Blood pressure 161/70 pulse 81 temperature 98.4 respirations 15 oxygen saturation 94% room air Constitutional.  No acute distress HEENT Head.  Normocephalic and atraumatic Eyes.  Pupils round and reactive to light no discharge.nystagmus Neck.  Supple nontender no JVD without thyromegaly Cardiac regular rate rhythm without extra sounds or murmur heard Abdomen.  Soft nontender positive bowel sounds without rebound Respiratory effort normal no respiratory distress without wheeze Musculoskeletal.  General.  Tenderness present Comments.  Left shoulder painful with minimal movement large amount of bruising behind left scapula Skin. Comments.  Scattered areas of bruising, large bruise by left shoulder Neurologic.  Alert no acute distress makes eye contact with examiner.  Could provide biographical information with extra time.  Overall with process delay.  Speech very dysphonic hypophonic but does fluctuate.  Pill-rolling tremor masked facies.  No frank rigidity but bradykinesia.  Motor 4/5 right upper extremity and 3-4/5 left upper extremity limited by clavicle pain.  Lower extremities bilateral 3-4/5.  No focal sensory findings.  DTRs trace to absent  /She  has had improvement in activity tolerance, balance, postural control as well as ability to compensate for deficits. Marlana Salvage has had improvement in functional use RUE/LUE  and RLE/LLE as well as improvement in  awareness.  Sessions with emphasis on functional mobility transfers generalized strengthening dynamic standing balance.  Patient transferred supine to sitting edge of bed with use of bed rails and contact-guard.  Donned jacket with moderate assist and patient scooted head of bed with supervision with increased time.  Maintain precautions for left upper extremity.  Ambulates 200 feet rolling walker  contact-guard occasional minimal assist to steer rolling walker.  Patient transferred sit to stand at sink x5 trials contact-guard minimal assist.  Patient currently completes amatory transfers with rolling walker contact-guard assist close supervision during ADLs.  Patient is able to engage in bathing with set up assist for upper body however requires up to mod assist for lower body bathing.  Continues to require increased physical assistance for some lower body due to decreased use of left upper extremity.  Patient continued to demonstrate some reduced vocal intensity requires moderate assist verbal cues to increase volume inconsistently.  Full family teaching completed plan discharge to home       Disposition: Discharged to home    Diet: Regular  Special Instructions: No driving smoking or alcohol  Medications at discharge 1.  Tylenol as needed 2.  Zebeta 2.5 mg daily 3.Bromfenac sodium 0.07% 1 drop right eye daily 4.  Sinemet 25-100 mg 2 tablets p.o. 3 times daily 5.  Colace 100 mg p.o. daily 6.  Lasix 40 mg p.o. daily 7.  Hydrocodone 1 tablet every 6 hours as needed pain 8.  Lisinopril 10 mg p.o. daily 9.  Melatonin 3 mg p.o. nightly 10.  Pravachol 40 mg p.o. daily 11.  Zoloft 50 mg p.o. daily 12.  Aspirin 81 mg daily 13.  Klor-Con 10 mEq daily  30-35 minutes were spent completing discharge summary and discharge planning  Discharge Instructions    Ambulatory referral to Physical Medicine Rehab   Complete by: As directed    Follow-up 1 month clavicle fracture with Parkinson's disease        Follow-up Information    Jamse Arn, MD Follow up.   Specialty: Physical Medicine and Rehabilitation Why: Office to call for appointment Contact information: [address] [place] [address] [phone]        Nahser, Wonda Cheng, MD Follow up.   Specialty: Cardiology Why: Call for appointment Contact information: [place] [address] [phone]        Marchia Bond, MD Follow up.   Specialty: Orthopedic Surgery Why: Call for appointment Contact information: [address] [place] [address] [phone]        Ivin Poot, MD Follow up.   Specialty: Cardiothoracic Surgery Why: Call for appointment Contact information: [address] [place] [address] [phone]               Signed: Cathlyn Parsons 05/28/2020, 5:16 AM

## 2020-05-27 NOTE — Progress Notes (Signed)
Physical Therapy Session Note  Patient Details  Name: Elizabeth Mccullough MRN: [id number] Date of Birth: 20-Sep-1940  Today's Date: 05/27/2020 PT Individual Time: 1345-1420 PT Individual Time Calculation (min): 35 min   Today's Date: 05/27/2020 PT Missed Time: 36 Minutes Missed Time Reason: Patient ill (Comment) (nausea)  Short Term Goals: Week 1:  PT Short Term Goal 1 (Week 1): Pt will complete least restrictive transfers with min A consistently PT Short Term Goal 1 - Progress (Week 1): Met PT Short Term Goal 2 (Week 1): Pt will ambulate x 75 ft with LRAD and min A PT Short Term Goal 2 - Progress (Week 1): Met PT Short Term Goal 3 (Week 1): Pt will demonstrate recall of WBing precautions with bed mobility and transfers 50% of the time PT Short Term Goal 3 - Progress (Week 1): Progressing toward goal Week 2:  PT Short Term Goal 1 (Week 2): STG=LTG due to LOS  Skilled Therapeutic Interventions/Progress Updates:   Received pt sitting in [facility] with RN present administering medications. Pt denied any pain during session but reported feeling nauseous and per RN, pt requesting to return to bed. With convincing pt agreeable to participate in therapy but requested to remain in room. Therapist suggested going outside to improve spirits, however pt declined. Pt finally agreeable to go to gym with max encouragement from therapist. Pt donned jacket with mod A and transferred sit<>stand without AD and min A to adjust jacket as pt repoted "it's choking me". Pt able to state aloud LUE NWB precautions but with poor carry over demonstrating them, as pt frequently pushing/pulling with LUE throughout session despite cues and despite LUE being in sling. Pt requesting to use incentive spirometer and able to perform 1x10 and 1x5 reps with cues for technique as pt attempting to blow into device. Pt ambulated 136f with RW and CGA/min A to therapy gym. Pt with R inattention, frequently running into obstacles on R side and  unable to correct without manual facilitation from therapist to control RW despite cues. Pt demonstrates shuffling gait pattern, "freezing" episodes when turning, and poor RW safety awareness pushing RW too far forward. Pt also demonstrated poor safety awareness when navigating steps as pt standing up before therapist was ready, without WC brakes locked, and pulling herself up into standing using LUE despite cues to maintain precautions. Pt navigated 4 steps with 1 handrail (although pt trying to grab and pull with LUE) ascending and descending with a step to pattern. Pt then stated "I want to go back to my room now" due to reports of nausea and [facility] Doffed sling and jacket with max A and pt transferred WC<>bed stand<>pivot with CGA and sit<>supine with CGA and increased time. Pt requested assist for management of legs stating "they usually help me with my legs." Therapist encouraged pt to be as independent as possible for discharge and pt transferred sit<>supine with CGA and max cues to maintain LUE NWB precautions. Pt with increased difficulty motor planning and problem solving how to roll from R sidelying<>supine in bed requiring increased time and cues. Concluded session with pt supine in bed, needs within reach, and bed alarm on. RN aware of pt's current status. 40 minutes missed of skilled physical therapy due to reports of nausea.    Therapy Documentation Precautions:  Precautions Precautions: Fall Precaution Comments: Per MD, ok to use walker but encourage pt to avoid pushing through L UE too much Required Braces or Orthoses:  Sling Restrictions Weight Bearing Restrictions: Yes LUE Weight Bearing: Partial weight bearing Other Position/Activity Restrictions: NWB in sling, but ok to use walker without pushing through L UE too much   Therapy/Group: Individual Therapy Alfonse Alpers PT, DPT   05/27/2020, 7:34 AM

## 2020-05-27 NOTE — Progress Notes (Signed)
Speech Language Pathology Daily Session Note  Patient Details  Name: Elizabeth Mccullough MRN: [id number] Date of Birth: May 05, 1941  Today's Date: 05/27/2020 SLP Individual Time: 1032-1100 and 832-900 SLP Individual Time Calculation (min): 28 min and 28 min  Short Term Goals: Week 2: SLP Short Term Goal 1 (Week 2): STG=LTG due to short ELOS  Skilled Therapeutic Interventions: #1 Skilled ST services focused on speech and cognitive skills. Pt expressed headache and fatigue requesting to participate in ST services in bed. SLP assistance with transfer with WR, pt required min A verbal cues for sequencing safety protocol. SLP targeted increasing vocal intensity in structured task describing simple picture scenes. Pt demonstrated 90% intelligibility fading to 85% intelligibility with min A verbal cues to maintain vocal intensity. SLP also facilitated sfaety and anticipatory awareness skills in conversation. Pt expressed concern about independence " Will I ever be able to go to the bathroom alone?" SLP provided education pertaining to need for increase supervision and pt supported concern "of falling." Treatment was ended early due to fatigue.Pt was left in room with call bell within reach and bedalarm set. SLP recommends to continue skilled services.  #2 Skilled ST services focused on speech and cognitive skills. Pt demonstrated noted reduced vocal intensity compared to earlier session and required mod A verbal cues to reach only 80% intelligibility at phrase level. Pt completed return demonstration of EMST set at 5cm H2O pt completed x10 repetitions with a self-effort score of 5 out 10. SLP increased resistance to 6cm H20, pt completed x10 repetitions with a self-effort score of 6 out 10. SLP facilitated safety awareness skills in creating a step by step list for sit to stand trasnfer pt, pt required supervision A verbal cues to verbally sequence steps, initially unsure of hand placement on walker. Pt was  left in room with call bell within reach and chair alarm set. SLP recommends to continue skilled services.      Pain Pain Assessment Pain Score: 0-No pain  Therapy/Group: Individual Therapy  [name]  [facility] 05/27/2020, 12:30 PM

## 2020-05-27 NOTE — Progress Notes (Signed)
[place] PHYSICAL MEDICINE & REHABILITATION PROGRESS NOTE   Subjective/Complaints: Patient seen sitting up in her chair this morning.  She states she did not sleep well overnight due to pain, but states that her pain was relieved after she received medications.  ROS: Denies CP, SOB, N/V/D  Objective:   No results found. No results for input(s): WBC, HGB, HCT, PLT in the last 72 hours. No results for input(s): NA, K, CL, CO2, GLUCOSE, BUN, CREATININE, CALCIUM in the last 72 hours.  Intake/Output Summary (Last 24 hours) at 05/27/2020 0931 Last data filed at 05/27/2020 0700 Gross per 24 hour  Intake 180 ml  Output --  Net 180 ml        Physical Exam: Vital Signs Blood pressure (!) 149/77, pulse 61, temperature 97.9 F (36.6 C), temperature source Oral, resp. rate 16, height 5\' 2"  (1.575 m), weight 59.9 kg, last menstrual period 07/19/1988, SpO2 97 %. Constitutional: No distress . Vital signs reviewed. HENT: Normocephalic.  Atraumatic. Eyes: EOMI. No discharge. Cardiovascular: No JVD.  RRR. Respiratory: Normal effort.  No stridor.  Bilateral clear to auscultation. GI: Non-distended.  BS +. Skin: Warm and dry.  Intact. Psych: Flat.  Slowed. Musc: No edema in extremities.  Left clavicle mild TTP. Neuro: Alert Motor: Grossly 4+/5 throughout, left shoulder with mild limitation due to pain, stable  Assessment/Plan: 1. Functional deficits secondary to gait disorder, left clavicular fx which require 3+ hours per day of interdisciplinary therapy in a comprehensive inpatient rehab setting.  Physiatrist is providing close team supervision and 24 hour management of active medical problems listed below.  Physiatrist and rehab team continue to assess barriers to discharge/monitor patient progress toward functional and medical goals  Care Tool:  Bathing    Body parts bathed by patient: Right arm, Left arm, Chest, Abdomen, Face, Front perineal area   Body parts bathed by helper:  Front perineal area, Buttocks     Bathing assist Assist Level: Contact Guard/Touching assist     Upper Body Dressing/Undressing Upper body dressing   What is the patient wearing?: Pull over shirt    Upper body assist Assist Level: Minimal Assistance - Patient > 75%    Lower Body Dressing/Undressing Lower body dressing      What is the patient wearing?: Pants     Lower body assist Assist for lower body dressing: Minimal Assistance - Patient > 75%     Toileting Toileting    Toileting assist Assist for toileting: Minimal Assistance - Patient > 75%     Transfers Chair/bed transfer  Transfers assist     Chair/bed transfer assist level: Minimal Assistance - Patient > 75%     Locomotion Ambulation   Ambulation assist      Assist level: Contact Guard/Touching assist Assistive device: Walker-rolling Max distance: >260ft   Walk 10 feet activity   Assist     Assist level: Contact Guard/Touching assist Assistive device: Walker-rolling   Walk 50 feet activity   Assist Walk 50 feet with 2 turns activity did not occur: Safety/medical concerns  Assist level: Contact Guard/Touching assist Assistive device: Walker-rolling    Walk 150 feet activity   Assist Walk 150 feet activity did not occur: Safety/medical concerns  Assist level: Contact Guard/Touching assist Assistive device: Walker-rolling    Walk 10 feet on uneven surface  activity   Assist Walk 10 feet on uneven surfaces activity did not occur: Safety/medical concerns   Assist level: Minimal Assistance - Patient > 75% Assistive device: Walker-rolling  Wheelchair     Assist Will patient use wheelchair at discharge?: No Type of Wheelchair: Manual    Wheelchair assist level: Supervision/Verbal cueing Max wheelchair distance: 71ft    Wheelchair 50 feet with 2 turns activity    Assist            Wheelchair 150 feet activity     Assist          Blood pressure (!)  149/77, pulse 61, temperature 97.9 F (36.6 C), temperature source Oral, resp. rate 16, height 5\' 2"  (1.575 m), weight 59.9 kg, last menstrual period 07/19/1988, SpO2 97 %.   Medical Problem List and Plan: 1.  Decreased functional mobility secondary to left comminuted clavicle fracture and right knee contusion after fall.  Nonweightbearing.  Follow-up Dr. Mardelle Matte.  Shoulder sling as directed.  Patient was cleared to use left upper extremity for support during ambulation with a walker but no aggressive active range of motion or passive range of motion.  Continue CIR 2.  Antithrombotics: -DVT/anticoagulation: Subcutaneous heparin.  -antiplatelet therapy: N/A 3. Pain Management: Hydrocodone as needed  Local heat/ice to shoulder as needed  Controlled with meds on 11/9 4. Mood: Zoloft 25 mg daily, Ativan 0.5 mg- wean to q8H prn given fall risk, decreased to 0.25 on 11/9             -antipsychotic agents: N/A 5. Neuropsych: This patient is capable of making decisions on her own behalf. 6. Skin/Wound Care: Routine skin checks 7. Fluids/Electrolytes/Nutrition: Routine in and outs. 8.  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  Lasix 40 mg daily.    Filed Weights   05/25/20 0411 05/26/20 0321 05/27/20 0512  Weight: 57.6 kg 59.8 kg 59.9 kg   Stable on 11/9 9.  Parkinson's disease.    Sinemet 25-100 mg 2 tabs 3 times daily 10.  Hypertension.  Lisinopril 2.5 mg daily, Zebeta 2.5 mg daily   Vitals:   05/26/20 2010 05/27/20 0459  BP: (!) 149/72 (!) 149/77  Pulse: 70 61  Resp: 16 16  Temp: 97.8 F (36.6 C) 97.9 F (36.6 C)  SpO2: 100% 97%   Labile on 11/9, monitor for trend 11.  Hyperlipidemia.  Pravachol 12.  Fusiform thoracic aneurysm ascending aorta, chronic and stable.  Follow-up outpatient Dr. Darcey Nora 13. Constipation:  Improving 14.  Anemia  Hemoglobin 10.8 on 11/1, labs ordered for tomorrow  Cont to monitor 15.  AKI  Creatinine 0.99  on 11/3, BUN elevated, GFR 57, plan to order labs later this week  Encourage fluids   LOS: 10 days A FACE TO FACE EVALUATION WAS PERFORMED  Sherial Ebrahim [name] 05/27/2020, 9:31 AM

## 2020-05-28 ENCOUNTER — Inpatient Hospital Stay (HOSPITAL_COMMUNITY): Payer: Medicare Other

## 2020-05-28 ENCOUNTER — Inpatient Hospital Stay (HOSPITAL_COMMUNITY): Payer: Medicare Other | Admitting: Speech Pathology

## 2020-05-28 ENCOUNTER — Inpatient Hospital Stay (HOSPITAL_COMMUNITY): Payer: Medicare Other | Admitting: Physical Therapy

## 2020-05-28 ENCOUNTER — Inpatient Hospital Stay (HOSPITAL_COMMUNITY): Payer: Medicare Other | Admitting: Occupational Therapy

## 2020-05-28 DIAGNOSIS — G479 Sleep disorder, unspecified: Secondary | ICD-10-CM

## 2020-05-28 LAB — CBC WITH DIFFERENTIAL/PLATELET
Abs Immature Granulocytes: 0.03 10*3/uL (ref 0.00–0.07)
Basophils Absolute: 0 10*3/uL (ref 0.0–0.1)
Basophils Relative: 1 %
Eosinophils Absolute: 0.2 10*3/uL (ref 0.0–0.5)
Eosinophils Relative: 3 %
HCT: 33 % — ABNORMAL LOW (ref 36.0–46.0)
Hemoglobin: 10.6 g/dL — ABNORMAL LOW (ref 12.0–15.0)
Immature Granulocytes: 1 %
Lymphocytes Relative: 29 %
Lymphs Abs: 1.6 10*3/uL (ref 0.7–4.0)
MCH: 31.8 pg (ref 26.0–34.0)
MCHC: 32.1 g/dL (ref 30.0–36.0)
MCV: 99.1 fL (ref 80.0–100.0)
Monocytes Absolute: 0.6 10*3/uL (ref 0.1–1.0)
Monocytes Relative: 10 %
Neutro Abs: 3.1 10*3/uL (ref 1.7–7.7)
Neutrophils Relative %: 56 %
Platelets: 318 10*3/uL (ref 150–400)
RBC: 3.33 MIL/uL — ABNORMAL LOW (ref 3.87–5.11)
RDW: 12.8 % (ref 11.5–15.5)
WBC: 5.5 10*3/uL (ref 4.0–10.5)
nRBC: 0 % (ref 0.0–0.2)

## 2020-05-28 LAB — GLUCOSE, CAPILLARY
Glucose-Capillary: 107 mg/dL — ABNORMAL HIGH (ref 70–99)
Glucose-Capillary: 102 mg/dL — ABNORMAL HIGH (ref 70–99)

## 2020-05-28 MED ORDER — ACETAMINOPHEN 325 MG PO TABS: 650.0000 mg | ORAL_TABLET | Freq: Four times a day (QID) | ORAL | Status: DC | PRN

## 2020-05-28 MED ORDER — LORAZEPAM 0.5 MG PO TABS: 0.2500 mg | ORAL_TABLET | Freq: Three times a day (TID) | ORAL | 0 refills | Status: DC | PRN

## 2020-05-28 MED ORDER — LOVASTATIN 20 MG PO TABS: 20.0000 mg | ORAL_TABLET | Freq: Every day | ORAL | 0 refills | Status: DC

## 2020-05-28 MED ORDER — SERTRALINE HCL 50 MG PO TABS: 50.0000 mg | ORAL_TABLET | Freq: Every day | ORAL | 0 refills | Status: DC

## 2020-05-28 MED ORDER — LISINOPRIL 10 MG PO TABS: 10.0000 mg | ORAL_TABLET | Freq: Every day | ORAL | 0 refills | Status: DC

## 2020-05-28 MED ORDER — NON FORMULARY: 3.0000 mg | Freq: Every day | Status: DC

## 2020-05-28 MED ORDER — DOCUSATE SODIUM 100 MG PO CAPS: 100.0000 mg | ORAL_CAPSULE | Freq: Every day | ORAL | 0 refills | Status: DC

## 2020-05-28 MED ORDER — BISOPROLOL FUMARATE 5 MG PO TABS: 2.5000 mg | ORAL_TABLET | Freq: Every day | ORAL | 3 refills | Status: DC

## 2020-05-28 MED ORDER — MELATONIN 3 MG PO TABS: 3.0000 mg | ORAL_TABLET | Freq: Every day | ORAL | 0 refills | Status: DC

## 2020-05-28 MED ORDER — HYDROCODONE-ACETAMINOPHEN 5-325 MG PO TABS: 1.0000 | ORAL_TABLET | Freq: Four times a day (QID) | ORAL | 0 refills | Status: DC | PRN

## 2020-05-28 MED ORDER — SERTRALINE HCL 50 MG PO TABS
50.0000 mg | ORAL_TABLET | Freq: Every day | ORAL | Status: DC
  Administered 2020-05-28 – 2020-05-31 (×4): 50 mg via ORAL
  Filled 2020-05-28 (×4): qty 1

## 2020-05-28 MED ORDER — FUROSEMIDE 40 MG PO TABS: 40.0000 mg | ORAL_TABLET | Freq: Every day | ORAL | 0 refills | Status: DC

## 2020-05-28 MED ORDER — POTASSIUM CHLORIDE ER 10 MEQ PO TBCR: 10.0000 meq | EXTENDED_RELEASE_TABLET | Freq: Every day | ORAL | 3 refills | Status: DC

## 2020-05-28 NOTE — Progress Notes (Signed)
Occupational Therapy Session Note  Patient Details  Name: NYIMAH SHADDUCK MRN: [id number] Date of Birth: 23-Dec-1940  Today's Date: 05/28/2020 OT Individual Time: 4403-4742 OT Individual Time Calculation (min): 60 min    Short Term Goals: Week 2:  OT Short Term Goal 1 (Week 2): STG = LTGs due to remaining LOS  Skilled Therapeutic Interventions/Progress Updates:    Treatment session with focus on functional mobility, dynamic standing balance, endurance, and awareness of NWB through LUE during tasks. Pt received supine in bed agreeable to therapy session.  Pt expressed desire to wash hair, but reports someone typically assists her with that.  Pt completed bed mobility with supervision and sit > stand and ambulatory transfer to sink with RW with CGA to min assist.  Therapist providing cues throughout session for hand placement during sit <> stand to not pull up from RW or sink/counter.  Therapist washed hair, pt then completed grooming tasks of brushing hair while seated and then oral care while standing at sink.  Engaged in standing balance and endurance while engaging in table top task with pipe tree puzzle.  Pt required mod cues for sequencing and problem solving during completion of task.  Pt requested seated rest break during session.Pt ambulated 100' with RW back to room with min cues for positioning with RW.  Pt reports need to toilet.  Ambulated to toilet with RW with CGA, pt completed clothing management and hygiene with CGA.  Pt returned to sink to complete hand hygiene and then remained upright in w/c with seat belt alarm on and all needs in reach.  Therapy Documentation Precautions:  Precautions Precautions: Fall Precaution Comments: Per MD, ok to use walker but encourage pt to avoid pushing through L UE too much Required Braces or Orthoses: Sling Restrictions Weight Bearing Restrictions: Yes LUE Weight Bearing: Partial weight bearing Other Position/Activity Restrictions: NWB in  sling, but ok to use walker without pushing through L UE too much Pain: Pain Assessment Pain Scale: 0-10 Pain Score: 0-No pain   Therapy/Group: Individual Therapy  [name] 05/28/2020, 12:23 PM

## 2020-05-28 NOTE — Progress Notes (Signed)
Physical Therapy Session Note  Patient Details  Name: Elizabeth Mccullough MRN: [id number] Date of Birth: 01-Feb-1941  Today's Date: 05/28/2020 PT Individual Time: 0174-9449 PT Individual Time Calculation (min): 27 min   Short Term Goals: Week 2:  PT Short Term Goal 1 (Week 2): STG=LTG due to LOS  Skilled Therapeutic Interventions/Progress Updates:    Pt received seated in bed, agreeable to PT session. No complaints of pain. Supine to sitting EOB with CGA with cues for adherence to LUE NWBing precautions. Sit to stand with CGA to RW. Ambulation x 150 ft with RW and CGA with cues for safe RW management as pt tends to push RW out ahead of her during gait. Nustep level 3 x 10 min with use of BLE for reciprocal movement training. Pt agreeable to stay seated in w/c in room at end of session, needs in reach, quick release belt and chair alarm in place.  Therapy Documentation Precautions:  Precautions Precautions: Fall Precaution Comments: Per MD, ok to use walker but encourage pt to avoid pushing through L UE too much Required Braces or Orthoses: Sling Restrictions Weight Bearing Restrictions: No LUE Weight Bearing: Partial weight bearing Other Position/Activity Restrictions: NWB in sling, but ok to use walker without pushing through L UE too much   Therapy/Group: Individual Therapy   Excell Seltzer, PT, DPT  05/28/2020, 4:58 PM

## 2020-05-28 NOTE — Progress Notes (Signed)
Pt awake sitting up in the bed. Pt denies needing to use the bathroom, denies pain, and no signs of distress at this time. Pt appears to be anxious, fidgeting with her blankets and things on her table. Pt encouraged to rest and get sleep. Pt states " I am not sleepy, is it time to get up?" pt reoriented to time/place/situation, bed alarm on, call light in reach.

## 2020-05-28 NOTE — Progress Notes (Signed)
Physical Therapy Session Note  Patient Details  Name: Elizabeth Mccullough MRN: [id number] Date of Birth: 08/11/1940  Today's Date: 05/28/2020 PT Individual Time: 5183-3582 PT Individual Time Calculation (min): 40 min   Short Term Goals: Week 1:  PT Short Term Goal 1 (Week 1): Pt will complete least restrictive transfers with min A consistently PT Short Term Goal 1 - Progress (Week 1): Met PT Short Term Goal 2 (Week 1): Pt will ambulate x 75 ft with LRAD and min A PT Short Term Goal 2 - Progress (Week 1): Met PT Short Term Goal 3 (Week 1): Pt will demonstrate recall of WBing precautions with bed mobility and transfers 50% of the time PT Short Term Goal 3 - Progress (Week 1): Progressing toward goal Week 2:  PT Short Term Goal 1 (Week 2): STG=LTG due to LOS  Skilled Therapeutic Interventions/Progress Updates:   Received pt sitting in WC finishing lunch, pt agreeable to therapy, and denied any pain during session. Session with emphasis on functional mobility/transfers, generalized strengthening, dynamic standing balance/coordination, ambulation, NMR and sequencing, and improved activity tolerance. Pt transferred sit<>stand without AD CGA and donned jacket with mod A and L sling total A. Pt suddenly trying to begin walking trying to step over legrests in front of her to get to bathroom. When therapist asked pt where she was trying to go pt stated "to get my jacket" and required cues for re-orientation and therapist explained to pt she was wearing her jacket. Pt ambulated 172f x 2 trials with RW and CGA to/from dayroom. Pt demonstrated shuffling gait pattern, narrow BOS, flexed trunk, and poor RW safety awareness pushing RW too far forward. However, noted less R inattention and overall less cues to avoid running into obstacles. Worked on dynamic standing balance, fine motor control, motor planning/sequencing constructing picture with foam legos with max verbal and tactile cues for order, sequencing,  and spatial awareness with CGA for balance. Pt able to make 2 stacks of 4 blocks separated by color and stack them on top of each other with min cues overall. Pt with increased difficulty lining up holes to pegs in legos. Pt requested to return to bed and doffed jacket and sling with max A. Pt transferred sit<>R sidelying<>supine with CGA and increased time with max cues to maintain LUE NWB precautions, however pt with poor adherence to precautions. Concluded session with pt supine in bed, needs within reach, and bed alarm on.   Therapy Documentation Precautions:  Precautions Precautions: Fall Precaution Comments: Per MD, ok to use walker but encourage pt to avoid pushing through L UE too much Required Braces or Orthoses: Sling Restrictions Weight Bearing Restrictions: Yes LUE Weight Bearing: Partial weight bearing Other Position/Activity Restrictions: NWB in sling, but ok to use walker without pushing through L UE too much   Therapy/Group: Individual Therapy AAlfonse AlpersPT, DPT   05/28/2020, 7:34 AM

## 2020-05-28 NOTE — Progress Notes (Signed)
Speech Language Pathology Daily Session Note  Patient Details  Name: Elizabeth Mccullough MRN: [id number] Date of Birth: [birth year]  Today's Date: 05/28/2020 SLP Individual Time: 0800-0830 SLP Individual Time Calculation (min): 30 min  Short Term Goals: Week 2: SLP Short Term Goal 1 (Week 2): STG=LTG due to short ELOS  Skilled Therapeutic Interventions:   Patient seen for skilled ST session focused on cognitive-linguistic goals. Upon entering room, patient informed SLP that she needed to use bathroom. SLP assisted patient as needed, which involved pulling on back of her pants to help with sit to stand to RW. Patient required verbal cues and CGA due to pushing walker too far away from her. She exhibited somewhat of a shuffling gait. Once she was standing in front of commode, she required supervision A for completing toileting and don/doff pants and brief. Patient ambulated to sink and washed hands with SLP only assisting with getting paper towel from dispenser. Patient required minA for positioning RW in front of bed. SLP instructed patient to direct her needs for assistance with bed mobility. After she was then sitting edge of bed, she required assistance moving her feet onto bed. While still laying on her side, she promptly fell asleep. SLP was able to awaken her briefly and she requested having the sheet pulled on. She missed 30 minutes of ST due to fatigue. Patient continues to benefit from skilled SLP intervention to maximize speech, voice, cognitive-linguistic functioning prior to discharge.  Pain Pain Assessment Pain Scale: 0-10 Pain Score: 0-No pain  Therapy/Group: Individual Therapy   Sonia Baller, MA, CCC-SLP Speech Therapy

## 2020-05-28 NOTE — Progress Notes (Addendum)
Patient ID: Elizabeth Mccullough, female   DOB: Sep 14, 1940, 79 y.o.   MRN: [id number]  Left message for jennifer-[employer] CM regarding answer for the extension asked for yesterday. Spoke with kelly-daughter to let her know waiting for response. Will call her back once know answer.  2;00 Pm Another message left for jennifer-[employer] CM  2:11 PM Spoke with Larene Beach who report just processed the information and will ask medical Director to look at and make a decision and get back with this worker. Have asked for extension to Sat 11/13. Await decision.  3:53 PM Spoke with Shannon-[employer] who reports the Medical Director has approved pt until Sat with discharge then. Spoke with kelly-daughter to make aware and therapy team along with MD. Daughter to inform pt of extension.

## 2020-05-28 NOTE — Patient Care Conference (Signed)
Inpatient RehabilitationTeam Conference and Plan of Care Update Date: 05/28/2020   Time: 11:56 AM    Patient Name: Elizabeth Mccullough      Medical Record Number: [id number]  Date of Birth: 06-24-1941 Sex: Female         Room/Bed: 4W07C/4W07C-01 Payor Info: Payor: [place] / Plan: [employer] MEDICARE / Product Type: *No Product type* /    Admit Date/Time:  05/17/2020  3:02 PM  Primary Diagnosis:  Clavicle fracture  Hospital Problems: Principal Problem:   Clavicle fracture Active Problems:   Anemia   Essential hypertension   Parkinson's disease ([place])   Chronic combined systolic and diastolic congestive heart failure (HCC)   Clavicle pain   AKI (acute kidney injury) ([place])   Chronic diastolic congestive heart failure ([place])   Sleep disturbance    Expected Discharge Date: Expected Discharge Date: 06/03/20 (Pending insurance)  Team Members Present: Physician leading conference: Dr. Delice Lesch Care Coodinator Present: Dorien Chihuahua, RN, BSN, CRRN;Becky Dupree, LCSW Nurse Present: Other (comment) Susie Cassette, RN) PT Present: Becky Sax, PT OT Present: [name], OT SLP Present: Nadara Mode, SLP PPS Coordinator present : Gunnar Fusi, Novella Olive, PT     Current Status/Progress Goal Weekly Team Focus  Bowel/Bladder             Swallow/Nutrition/ Hydration             ADL's   min-mod assist sit > stand, CGA ambulatory transfers with RW, Min A UB and LB bathing and dressing  Supervision overall, CGA shower transfer.  Will require assistance to don sling  ADL retraining, activity tolerance/endurance, recall of NWB through LUE during functional tasks   Mobility   bed mobility CGA/min A, transfers with RW CGA/min A, gait >150ft with RW and CGA  supervision  functional mobility/transfers, generalized strengthening, dynamic standing balance/coordination, ambulation, and endurance   Communication   Mod-min A, inconsistent vocal intensity at  phrase/sentence level  Min A - downgraded 11/8  EMST, awareness of vocal intensity/carry over of strategies and education   Safety/Cognition/ Behavioral Observations  Min A  Min-Supervision A - basic  education, basic problem solving, safety awareness and sustained attention   Pain             Skin               Discharge Planning:  [name] has asked for an extension and information sent to the insurance yesterday-check with today regarding decision. Pt will not have 24/7 care at [facility]   Team Discussion: MD ordered melatonin for insomnia. BP is controlled, encourage po fluids. Progress impaired by right neglect; runs into wall, items, etc on the right with the rollator, poor motor planning, and need for instructional cues for activities, and fluctuating functional status. Requires cues not to use left arm. Requires supervision for basic problem solving. Currently supervision for basic problem solving. Patient on target to meet rehab goals: Supervision goals set; Family requesting an extension of the discharge due to issues noted and patient has good/bad days that impair progress  *See Care Plan and progress notes for long and short-term goals.   Revisions to Treatment Plan:  Recommend use of walker at home instead of rollator SLP downgraded communication goals  Teaching Needs: Transfers, toileting, medications, cues, etc.   Current Barriers to Discharge: Decreased caregiver support and Weight bearing restrictions  Possible Resolutions to Barriers: 24 hour care recommended/hired caregivers    Medical Summary Current Status: Decreased functional mobility  secondary to left comminuted clavicle fracture and right knee contusion after fall.  Nonweightbearing.  Follow-up Dr. Mardelle Matte.  Shoulder sling as directed.  Patient was cleared to use left upper extremity for support during ambulation with a walker but no aggressive active range of motion or passive range of motion.  Barriers to  Discharge: Medical stability;Weight bearing restrictions;Other (comments);Behavior;Insurance for SNF coverage  Barriers to Discharge Comments: Cognition in conjuction with WB precautions, insurance limitation with SNF Possible Resolutions to [employer] Focus: Therapies, follow labs - Cr, Hb, optimize sleep, optimize BP meds   Continued Need for Acute Rehabilitation Level of Care: The patient requires daily medical management by a physician with specialized training in physical medicine and rehabilitation for the following reasons: Direction of a multidisciplinary physical rehabilitation program to maximize functional independence : Yes Medical management of patient stability for increased activity during participation in an intensive rehabilitation regime.: Yes Analysis of laboratory values and/or radiology reports with any subsequent need for medication adjustment and/or medical intervention. : Yes   I attest that I was present, lead the team conference, and concur with the assessment and plan of the team.   Dorien Chihuahua B 05/28/2020, 3:50 PM

## 2020-05-28 NOTE — Progress Notes (Signed)
[facility] PHYSICAL MEDICINE & REHABILITATION PROGRESS NOTE   Subjective/Complaints: Patient seen laying in bed this morning.  She states she did not sleep well overnight, and then states she slept well.  Per nursing patient sitting up in bed at night.  She is sleepy this morning.  ROS: Denies CP, SOB, N/V/D  Objective:   No results found. Recent Labs    05/28/20 0414  WBC 5.5  HGB 10.6*  HCT 33.0*  PLT 318   No results for input(s): NA, K, CL, CO2, GLUCOSE, BUN, CREATININE, CALCIUM in the last 72 hours.  Intake/Output Summary (Last 24 hours) at 05/28/2020 1032 Last data filed at 05/28/2020 0900 Gross per 24 hour  Intake 590 ml  Output --  Net 590 ml        Physical Exam: Vital Signs Blood pressure 113/60, pulse 70, temperature 97.9 F (36.6 C), resp. rate 16, height 5\' 2"  (1.575 m), weight 59.4 kg, last menstrual period 07/19/1988, SpO2 94 %. Constitutional: No distress . Vital signs reviewed. HENT: Normocephalic.  Atraumatic. Eyes: EOMI. No discharge. Cardiovascular: No JVD.  RRR. Respiratory: Normal effort.  No stridor.  Bilateral clear to auscultation. GI: Non-distended.  BS +. Skin: Warm and dry.  Intact. Psych: Flat.  Slowed. Musc: No edema in extremities.  Left clavicle with mild TTP. Neuro: Alert Motor: Grossly 4+/5 throughout, left shoulder with mild limitation due to pain, unchanged  Assessment/Plan: 1. Functional deficits secondary to gait disorder, left clavicular fx which require 3+ hours per day of interdisciplinary therapy in a comprehensive inpatient rehab setting.  Physiatrist is providing close team supervision and 24 hour management of active medical problems listed below.  Physiatrist and rehab team continue to assess barriers to discharge/monitor patient progress toward functional and medical goals  Care Tool:  Bathing    Body parts bathed by patient: Right arm, Left arm, Chest, Abdomen, Face, Front perineal area, Buttocks   Body parts  bathed by helper: Front perineal area, Buttocks     Bathing assist Assist Level: Supervision/Verbal cueing     Upper Body Dressing/Undressing Upper body dressing   What is the patient wearing?: Pull over shirt    Upper body assist Assist Level: Supervision/Verbal cueing    Lower Body Dressing/Undressing Lower body dressing      What is the patient wearing?: Pants     Lower body assist Assist for lower body dressing: Supervision/Verbal cueing     Toileting Toileting    Toileting assist Assist for toileting: Contact Guard/Touching assist     Transfers Chair/bed transfer  Transfers assist     Chair/bed transfer assist level: Contact Guard/Touching assist     Locomotion Ambulation   Ambulation assist      Assist level: Contact Guard/Touching assist Assistive device: Walker-rolling Max distance: 168ft   Walk 10 feet activity   Assist     Assist level: Contact Guard/Touching assist Assistive device: Walker-rolling   Walk 50 feet activity   Assist Walk 50 feet with 2 turns activity did not occur: Safety/medical concerns  Assist level: Contact Guard/Touching assist Assistive device: Walker-rolling    Walk 150 feet activity   Assist Walk 150 feet activity did not occur: Safety/medical concerns  Assist level: Contact Guard/Touching assist Assistive device: Walker-rolling    Walk 10 feet on uneven surface  activity   Assist Walk 10 feet on uneven surfaces activity did not occur: Safety/medical concerns   Assist level: Minimal Assistance - Patient > 75% Assistive device: Chemical engineer  Assist Will patient use wheelchair at discharge?: No Type of Wheelchair: Manual    Wheelchair assist level: Supervision/Verbal cueing Max wheelchair distance: 29ft    Wheelchair 50 feet with 2 turns activity    Assist            Wheelchair 150 feet activity     Assist          Blood pressure 113/60, pulse 70,  temperature 97.9 F (36.6 C), resp. rate 16, height 5\' 2"  (1.575 m), weight 59.4 kg, last menstrual period 07/19/1988, SpO2 94 %.   Medical Problem List and Plan: 1.  Decreased functional mobility secondary to left comminuted clavicle fracture and right knee contusion after fall.  Nonweightbearing.  Follow-up Dr. Mardelle Matte.  Shoulder sling as directed.  Patient was cleared to use left upper extremity for support during ambulation with a walker but no aggressive active range of motion or passive range of motion.  Continue CIR  Team conference today to discuss current and goals and coordination of care, home and environmental barriers, and discharge planning with nursing, case manager, and therapies. Please see conference note from today as well.  2.  Antithrombotics: -DVT/anticoagulation: Subcutaneous heparin.  -antiplatelet therapy: N/A 3. Pain Management: Hydrocodone as needed  Local heat/ice to shoulder as needed  Controlled with meds on 11/10 4. Mood: Zoloft 25 mg daily, Ativan 0.5 mg- wean to q8H prn given fall risk, decreased to 0.25 on 11/9             -antipsychotic agents: N/A 5. Neuropsych: This patient is capable of making decisions on her own behalf. 6. Skin/Wound Care: Routine skin checks 7. Fluids/Electrolytes/Nutrition: Routine in and outs. 8.  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  Lasix 40 mg daily.    Filed Weights   05/26/20 0321 05/27/20 0512 05/28/20 0313  Weight: 59.8 kg 59.9 kg 59.4 kg   Stable on 11/10 9.  Parkinson's disease.    Sinemet 25-100 mg 2 tabs 3 times daily 10.  Hypertension.  Lisinopril 2.5 mg daily, Zebeta 2.5 mg daily   Vitals:   05/27/20 2019 05/28/20 0313  BP: 131/60 113/60  Pulse: 66 70  Resp: 16 16  Temp: 97.6 F (36.4 C) 97.9 F (36.6 C)  SpO2: 97% 94%   ?  Trending down, continue to monitor 11.  Hyperlipidemia.  Pravachol 12.  Fusiform thoracic aneurysm ascending aorta, chronic and stable.   Follow-up outpatient Dr. Darcey Nora 13. Constipation:  Improving 14.  Anemia  Hemoglobin 10.6 on 11/10  Cont to monitor 15.  AKI  Creatinine 0.99 on 11/3, BUN elevated, GFR 57, plan to order labs later this week  Encourage fluids 16.  Sleep disturbance  Melatonin added on 11/10  See #4   LOS: 11 days A FACE TO FACE EVALUATION WAS PERFORMED  Jeremy Mclamb [name] 05/28/2020, 10:32 AM

## 2020-05-29 ENCOUNTER — Inpatient Hospital Stay (HOSPITAL_COMMUNITY): Payer: Medicare Other

## 2020-05-29 ENCOUNTER — Other Ambulatory Visit: Payer: Self-pay | Admitting: Neurology

## 2020-05-29 ENCOUNTER — Inpatient Hospital Stay (HOSPITAL_COMMUNITY): Payer: Medicare Other | Admitting: Occupational Therapy

## 2020-05-29 MED ORDER — TRAZODONE HCL 50 MG PO TABS
25.0000 mg | ORAL_TABLET | Freq: Every evening | ORAL | Status: DC | PRN
  Administered 2020-05-29: 25 mg via ORAL
  Filled 2020-05-29: qty 1

## 2020-05-29 NOTE — Progress Notes (Signed)
Speech Language Pathology Daily Session Note  Patient Details  Name: KAIDA GAMES MRN: [id number] Date of Birth: 1940-10-15  Today's Date: 05/29/2020 SLP Individual Time: 1035-1130 SLP Individual Time Calculation (min): 55 min  Short Term Goals: Week 2: SLP Short Term Goal 1 (Week 2): STG=LTG due to short ELOS  Skilled Therapeutic Interventions:Skilled SLP intervention focused on cognition and speech intelligibility. Pt completed sustained attention task by locating items in large picture scene beginning with B. She required max A  with redirection to task with visual and verbal cues. Increased time and direction needed dur to patient often getting distracted and turning pages or removing crumbs from beside table. Pt produced 3 sentences to describe famous people using appropriate volume with mod A verbal cues for strategies. Cont with therapy per plan of care.      Pain Pain Assessment Pain Scale: Faces Pain Score: 0-No pain Faces Pain Scale: No hurt Patients Stated Pain Goal: 3  Therapy/Group: Individual Therapy  Darrol Poke Vonya Ohalloran 05/29/2020, 11:24 AM

## 2020-05-29 NOTE — Progress Notes (Signed)
Physical Therapy Discharge Summary  Patient Details  Name: Elizabeth Mccullough MRN: [id number] Date of Birth: 10/19/1940  Today's Date: 05/30/2020 PT Individual Time: 0800-0855 PT Individual Time Calculation (min): 55 min   Patient has met 7 of 7 long term goals due to improved activity tolerance, improved balance, improved postural control and increased strength. Patient to discharge at an ambulatory level Supervision. Pt's family did not attend family education training. Pt to discharge to independent living facility where she will have intermittent assist. However, this therapist has recommended need for 24/7 supervision/assist due to pt's poor safety awareness and problem solving, posterior lean in standing, festinating gait pattern, decreased balance/postural control, decreased motor planning and sequencing, and poor insight into deficits.   All goals met  Recommendation:  Patient will benefit from ongoing skilled PT services in home health setting to continue to advance safe functional mobility, address ongoing impairments in transfers, generalized weakness, decreased balance/postural control, ambulation, endurance, and to minimize fall risk.  Equipment: No equipment provided  Reasons for discharge: treatment goals met  Patient/family agrees with progress made and goals achieved: Yes  Today's Interventions: Received pt supine in bed, pt agreeable to therapy, and reported mild low back pain from laying in bed but did not state level. Session with emphasis on discharge planning, functional mobility/transfers, generalized strengthening, dynamic standing balance/coordination, ambulation, stair navigation, simulated car transfers, and improved activity tolerance. Pt requested to use restroom and performed bed mobility from flat bed with supervision and use of bedrails, transferred sit<>stand with RW and supervision, and ambulated 78f x 2 trials to/from bathroom with RW and close  supervision. Pt able to void, manage clothing, and perform hygiene management with CGA however pt not adhering to LUE NWB precautions despite cues. Pt stood at sink and washed hands with close supervision. [facility] purposes and navigated 12 steps with 2 rails and min A. Pt continued to pull on rail with LUE despite therapist's cues for NWB precautions. Pt ascending and descending stairs with a step to pattern and required cues for anterior weight shifting as pt pushing posteriorly. Pt transported to ortho gym and performed ambulatory simulated car transfer with RW and supervision, again with poor adherence to LUE NWB precautions as pt pulling on grab bar and pushing on car seat with LUE. Pt ambulated 1519fwith RW and close supervision with 2 LOB to L when turning requring min A to correct. Pt continues to demonstrate shuffling/festinating gait pattern, narrow BOS, decreased RW safety awareness, and tendency to run into obstacles on the R. Pt transferred sit<>stand with RW x 3 trials and performed the following exercises standing with BUE support on RW and CGA for balance: -hip flexion 1x15 and 1x10 bilaterally -mini-squats 2x10 -heel raises 2x12 [facility] Concluded session with pt sitting in WC, needs within reach, and seatbelt alarm on.   PT Discharge Precautions/Restrictions Precautions Precautions: Fall Precaution Comments: Per MD, ok to use walker but encourage pt to avoid pushing through L UE too much. Hx of Parkinsons Required Braces or Orthoses: Sling Restrictions Weight Bearing Restrictions: Yes LUE Weight Bearing: Non weight bearing Cognition Overall Cognitive Status: Impaired/Different from baseline Arousal/Alertness: Lethargic Orientation Level: Oriented X4 Memory: Impaired Awareness: Impaired Problem Solving: Impaired Safety/Judgment: Impaired Comments: Pt requires max cues to adhere to LUE NWB  precautions Sensation Sensation Light Touch: Appears Intact Proprioception: Appears Intact Coordination Gross Motor Movements are Fluid and Coordinated:  No Fine Motor Movements are Fluid and Coordinated: Yes Coordination and Movement Description: grossly uncoordinated due to posterior lean, decreased balance/postural control, festinating gait pattern, and poor motor planning/sequencing Finger Nose Finger Test: [facility] but slow Heel Shin Test: [facility] but slow Motor  Motor Motor: Abnormal postural alignment and control;Other (comment) Motor - Skilled Clinical Observations: impaired motor planning/sequencing, posterior lean in standing, festinating gait, and decreased balance/postural control  Mobility Bed Mobility Bed Mobility: Rolling Right;Rolling Left;Supine to Sit;Sit to Supine Rolling Right: Supervision/verbal cueing Rolling Left: Supervision/Verbal cueing Supine to Sit: Supervision/Verbal cueing Sit to Supine: Supervision/Verbal cueing Transfers Transfers: Sit to Stand;Stand to Sit;Stand Pivot Transfers Sit to Stand: Supervision/Verbal cueing Stand to Sit: Supervision/Verbal cueing Stand Pivot Transfers: Supervision/Verbal cueing Stand Pivot Transfer Details: Verbal cues for technique;Verbal cues for sequencing;Verbal cues for precautions/safety;Verbal cues for safe use of DME/AE Stand Pivot Transfer Details (indicate cue type and reason): verbal cues for turning technique, RW safety, and to maintain LUE NWB precautions Transfer (Assistive device): Rolling walker Locomotion  Gait Ambulation: Yes Gait Assistance: Supervision/Verbal cueing Gait Distance (Feet): 150 Feet Assistive device: Rolling walker Gait Assistance Details: Verbal cues for gait pattern;Verbal cues for sequencing;Verbal cues for technique;Verbal cues for precautions/safety;Verbal cues for safe use of DME/AE Gait Assistance Details: verbal cues to increase step length, to avoid running into obstacles on R side, and  for RW safety awareness Gait Gait: Yes Gait Pattern: Impaired Gait Pattern: Step-to pattern;Decreased trunk rotation;Shuffle;Decreased stride length;Decreased step length - right;Decreased step length - left;Trunk flexed;Poor foot clearance - left;Poor foot clearance - right;Narrow base of support;Festinating Gait velocity: decreased Stairs / Additional Locomotion Stairs: Yes Stairs Assistance: Minimal Assistance - Patient > 75% Stair Management Technique: Two rails Number of Stairs: 12 Height of Stairs: 6 Ramp: Contact Guard/touching assist (RW) Wheelchair Mobility Wheelchair Mobility: Yes Wheelchair Assistance: Supervision/Verbal cueing Wheelchair Propulsion: Right upper extremity;Both lower extermities Wheelchair Parts Management: Needs assistance Distance: 24f  Trunk/Postural Assessment  Cervical Assessment Cervical Assessment: Exceptions to [facility](forward head) Thoracic Assessment Thoracic Assessment: Exceptions to [facility](kyphosis) Lumbar Assessment Lumbar Assessment: Exceptions to W[facility](posterior pelvic tilt) Postural Control Postural Control: Deficits on evaluation  Balance Balance Balance Assessed: Yes Static Sitting Balance Static Sitting - Balance Support: Feet supported;Bilateral upper extremity supported Static Sitting - Level of Assistance: 6: Modified independent (Device/Increase time) Dynamic Sitting Balance Dynamic Sitting - Balance Support: Feet supported;Bilateral upper extremity supported Dynamic Sitting - Level of Assistance: 5: Stand by assistance (supervision) Static Standing Balance Static Standing - Balance Support: Bilateral upper extremity supported (RW) Static Standing - Level of Assistance: 5: Stand by assistance (supervision) Dynamic Standing Balance Dynamic Standing - Balance Support: Bilateral upper extremity supported (RW) Dynamic Standing - Level of Assistance: 5: Stand by assistance (close supervision) Extremity Assessment  RLE Assessment RLE  Assessment: Exceptions to [facility]General Strength Comments: grossly generalized to 4+/5 LLE Assessment LLE Assessment: Exceptions to WE[facility] VernonGeneral Strength Comments: grossly generalized to 4+/5  AAlfonse AlpersPT, DPT  05/29/2020, 12:18 PM

## 2020-05-29 NOTE — Progress Notes (Signed)
Occupational Therapy Session Note  Patient Details  Name: Elizabeth Mccullough MRN: [id number] Date of Birth: 02-07-1941  Today's Date: 05/29/2020 OT Individual Time: 5053-9767 OT Individual Time Calculation (min): 44 min    Short Term Goals: Week 2:  OT Short Term Goal 1 (Week 2): STG = LTGs due to remaining LOS  Skilled Therapeutic Interventions/Progress Updates:  Patient met lying supine in bed in agreement with OT treatment session. 0/10 pain reported at rest and with activity. Supine to EOB with cues for NWB precautions in LUE. Functional mobility to commode in bathroom with use of RW and extra car to prevent excessive pushing with LUE. Toilet transfer and toileting/hygiene/clothing management with supervision A. Patient with request for bathing declining shower level this date. Patient able to wash UB/LB and don UB/LB clothing with supervision A. Functional mobility from room to patient laundry room with CGA. Patient able to recall room number with increased time and able to find way back to room with occasional cues. Session concluded with patient seated in wc with call bell within reach, belt alarm activated, and all needs met.   Therapy Documentation Precautions:  Precautions Precautions: Fall Precaution Comments: Per MD, ok to use walker but encourage pt to avoid pushing through L UE too much Required Braces or Orthoses: Sling Restrictions Weight Bearing Restrictions: No LUE Weight Bearing: Partial weight bearing Other Position/Activity Restrictions: NWB in sling, but ok to use walker without pushing through L UE too much General:    Therapy/Group: Individual Therapy  Travaughn Vue R [place]-Davis 05/29/2020, 7:30 AM

## 2020-05-29 NOTE — Progress Notes (Signed)
Physical Therapy Session Note  Patient Details  Name: Elizabeth Mccullough MRN: [id number] Date of Birth: 1940-10-11  Today's Date: 05/29/2020 PT Individual Time: 6384-5364 PT Individual Time Calculation (min): 83 min   Short Term Goals: Week 1:  PT Short Term Goal 1 (Week 1): Pt will complete least restrictive transfers with min A consistently PT Short Term Goal 1 - Progress (Week 1): Met PT Short Term Goal 2 (Week 1): Pt will ambulate x 75 ft with LRAD and min A PT Short Term Goal 2 - Progress (Week 1): Met PT Short Term Goal 3 (Week 1): Pt will demonstrate recall of WBing precautions with bed mobility and transfers 50% of the time PT Short Term Goal 3 - Progress (Week 1): Progressing toward goal Week 2:  PT Short Term Goal 1 (Week 2): STG=LTG due to LOS  Skilled Therapeutic Interventions/Progress Updates:   Received pt sitting in WC, pt agreeable to therapy, and denied any pain during session but reported urge to use restroom. Session with emphasis on functional mobility/transfers, toileting, generalized strengthening, dynamic standing balance/coordination, ambulation, simulated car transfers, NMR, and improved activity tolerance. Pt requesting to take phone call first and noted pt with increased tremors in BUE today. Donned shoes with max A and pt transferred sit<>stand with RW and min A and ambulated 5f x 2 trials with RW and CGA to/from bathroom. Noted pt with strong posterior lean in standing and required max cues to avoid pushing and picking up RW with LUE. Pt able to manage clothes with min A, increased time, and cues for LUE NWB precautions and able to void and perform hygiene management standing with CGA. Pt washed hands standing at sink with CGA. Donned LUE sling total A and [facility] purposes and performed simulated car transfer with RW and CGA with cues for turning technique, sequencing, and RW safety. Pt with increased  difficulty lateral scooting in car asking therapist to scoot her over dependently. Therapist encouraged pt to do as much as possible independently, however pt ultimately pushing and pulling with LUE despite cues for NWB status. Pt ambulated 184fon uneven surfaces (ramp) with RW and CGA and picked up horseshoe from floor with RW and CGA for balance. Pt ambulated 16039fith RW and close supervision with 1 LOB to L requiring min/mod A to correct. Pt continues to demonstrate festinating gait pattern, flexed trunk, narrow BOS, and poor RW safety awareness. Discussion had regarding using RW vs rollator at discharge. Therapist explained safety concerns of using rollator and recommended pt use RW upon discharge, however pt insisting on using both. Pt transferred sit<>stand on [employer]ire[employer] with RW and min A x 2 trials and worked on dynamic standing balance with narrow BOS and 1 UE support for 30 seconds x 2 trials with min A for balance. Pt required maximal encouargement to continue session as [facility]. Explained to pt that her discharge date was extended to allow her more time to focus on functional mobility, endurance, strength, balance, and safety awareness to prepare for discharge and therefore encouraged pt to complete session. Stand<>pivot mat<>WC with RW and CGA and pt performed the following exercises sitting in WC with supervision and verbal cues for technique: -LAQ 2x12 bilaterally -hip flexion 2x12 bilaterally -hip adduction towel squeezes 2x10 Pt requested to get laundry and was [facility] to [facility]. Pt's laundry still damp and  therapist ran additional dry cycle and notified NT and RN to check on laundry later this afternoon. Pt transported back to room in [facility] total A and transferred WC<>bed stand<>pivot without AD and CGA and sit<>supine with supervision, increased time, and cues for technique. Concluded session with pt supine in bed, needs within reach, and  bed alarm on.   Therapy Documentation Precautions:  Precautions Precautions: Fall Precaution Comments: Per MD, ok to use walker but encourage pt to avoid pushing through L UE too much Required Braces or Orthoses: Sling Restrictions Weight Bearing Restrictions: No LUE Weight Bearing: Partial weight bearing Other Position/Activity Restrictions: NWB in sling, but ok to use walker without pushing through L UE too much  Therapy/Group: Individual Therapy Alfonse Alpers PT, DPT   05/29/2020, 7:39 AM

## 2020-05-29 NOTE — Progress Notes (Signed)
[place] PHYSICAL MEDICINE & REHABILITATION PROGRESS NOTE   Subjective/Complaints: Patient seen ambulating back from the restroom and sitting at the EOB.  She states she did not sleep well overnight and is perseverative on sleep.  She is aware of plans for discharge on Saturday.   ROS: Denies CP, SOB, N/V/D  Objective:   No results found. Recent Labs    05/28/20 0414  WBC 5.5  HGB 10.6*  HCT 33.0*  PLT 318   No results for input(s): NA, K, CL, CO2, GLUCOSE, BUN, CREATININE, CALCIUM in the last 72 hours.  Intake/Output Summary (Last 24 hours) at 05/29/2020 0948 Last data filed at 05/29/2020 0819 Gross per 24 hour  Intake 660 ml  Output --  Net 660 ml        Physical Exam: Vital Signs Blood pressure (!) 121/56, pulse 69, temperature 97.8 F (36.6 C), resp. rate 16, height 5\' 2"  (1.575 m), weight 59.5 kg, last menstrual period 07/19/1988, SpO2 99 %.  Constitutional: No distress . Vital signs reviewed. HENT: Normocephalic.  Atraumatic. Eyes: EOMI. No discharge. Cardiovascular: No JVD.  RRR. Respiratory: Normal effort.  No stridor.  Bilateral clear to auscultation. GI: Non-distended.  BS +. Skin: Warm and dry.  Intact. Psych: Flat. Slowed. Musc: No edema in extremities.  Left clavicle with improving mild TTP. Gait: Festinating  Neuro: Alert Motor: Grossly 4+/5 throughout, left shoulder with mild limitation due to pain, stable  Assessment/Plan: 1. Functional deficits secondary to gait disorder, left clavicular fx which require 3+ hours per day of interdisciplinary therapy in a comprehensive inpatient rehab setting.  Physiatrist is providing close team supervision and 24 hour management of active medical problems listed below.  Physiatrist and rehab team continue to assess barriers to discharge/monitor patient progress toward functional and medical goals  Care Tool:  Bathing    Body parts bathed by patient: Right arm, Left arm, Chest, Abdomen, Face, Front  perineal area, Buttocks   Body parts bathed by helper: Front perineal area, Buttocks     Bathing assist Assist Level: Supervision/Verbal cueing     Upper Body Dressing/Undressing Upper body dressing   What is the patient wearing?: Pull over shirt    Upper body assist Assist Level: Supervision/Verbal cueing    Lower Body Dressing/Undressing Lower body dressing      What is the patient wearing?: Pants     Lower body assist Assist for lower body dressing: Supervision/Verbal cueing     Toileting Toileting    Toileting assist Assist for toileting: Contact Guard/Touching assist     Transfers Chair/bed transfer  Transfers assist     Chair/bed transfer assist level: Contact Guard/Touching assist     Locomotion Ambulation   Ambulation assist      Assist level: Contact Guard/Touching assist Assistive device: Walker-rolling Max distance: 1104ft   Walk 10 feet activity   Assist     Assist level: Contact Guard/Touching assist Assistive device: Walker-rolling   Walk 50 feet activity   Assist Walk 50 feet with 2 turns activity did not occur: Safety/medical concerns  Assist level: Contact Guard/Touching assist Assistive device: Walker-rolling    Walk 150 feet activity   Assist Walk 150 feet activity did not occur: Safety/medical concerns  Assist level: Contact Guard/Touching assist Assistive device: Walker-rolling    Walk 10 feet on uneven surface  activity   Assist Walk 10 feet on uneven surfaces activity did not occur: Safety/medical concerns   Assist level: Minimal Assistance - Patient > 75% Assistive device: Walker-rolling  Wheelchair     Assist Will patient use wheelchair at discharge?: No Type of Wheelchair: Manual    Wheelchair assist level: Supervision/Verbal cueing Max wheelchair distance: 69ft    Wheelchair 50 feet with 2 turns activity    Assist            Wheelchair 150 feet activity     Assist           Blood pressure (!) 121/56, pulse 69, temperature 97.8 F (36.6 C), resp. rate 16, height 5\' 2"  (1.575 m), weight 59.5 kg, last menstrual period 07/19/1988, SpO2 99 %.  Medical Problem List and Plan: 1.  Decreased functional mobility secondary to left comminuted clavicle fracture and right knee contusion after fall.  Nonweightbearing.  Follow-up Dr. Mardelle Matte.  Shoulder sling as directed.  Patient was cleared to use left upper extremity for support during ambulation with a walker but no aggressive active range of motion or passive range of motion.  Continue CIR 2.  Antithrombotics: -DVT/anticoagulation: Subcutaneous heparin.  -antiplatelet therapy: N/A 3. Pain Management: Hydrocodone as needed  Local heat/ice to shoulder as needed  Controlled with meds on 11/11 4. Mood: Zoloft 25 mg daily, Ativan 0.5 mg- wean to q8H prn given fall risk, decreased to 0.25 on 11/9             -antipsychotic agents: N/A 5. Neuropsych: This patient is capable of making decisions on her own behalf. 6. Skin/Wound Care: Routine skin checks 7. Fluids/Electrolytes/Nutrition: Routine in and outs. 8.  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  Lasix 40 mg daily.    Filed Weights   05/27/20 0512 05/28/20 0313 05/29/20 0500  Weight: 59.9 kg 59.4 kg 59.5 kg   Stable on 11/11 9.  Parkinson's disease.    Sinemet 25-100 mg 2 tabs 3 times daily 10.  Hypertension.  Lisinopril 2.5 mg daily, Zebeta 2.5 mg daily   Vitals:   05/29/20 0534 05/29/20 0844  BP: 140/68 (!) 121/56  Pulse: 68 69  Resp: 16   Temp: 97.8 F (36.6 C)   SpO2: 97% 99%   Controlled on 11/11 11.  Hyperlipidemia.  Pravachol 12.  Fusiform thoracic aneurysm ascending aorta, chronic and stable.  Follow-up outpatient Dr. Darcey Nora 13. Constipation:  Improving 14.  Anemia  Hemoglobin 10.6 on 11/10  Cont to monitor 15.  AKI  Creatinine 0.99 on 11/3, BUN elevated, GFR 57, labs ordered for  tomorrow  Encourage fluids 16.  Sleep disturbance  Melatonin added on 11/10  See #4  ECG reviewed, improving Qtc - low dose trazodone ordered   LOS: 12 days A FACE TO FACE EVALUATION WAS PERFORMED  Elizabeth Mccullough Elizabeth Mccullough 05/29/2020, 9:48 AM

## 2020-05-29 NOTE — Progress Notes (Signed)
Occupational Therapy Session Note  Patient Details  Name: Elizabeth Mccullough MRN: [id number] Date of Birth: 1941/01/23  Today's Date: 05/29/2020 OT Individual Time: 1300-1330 OT Individual Time Calculation (min): 30 min    Short Term Goals: Week 2:  OT Short Term Goal 1 (Week 2): STG = LTGs due to remaining LOS  Skilled Therapeutic Interventions/Progress Updates:    Pt resting in w/c upon arrival. OT intervention with focus on table task while standing with CGA. Pt presented with peg board pattern and instructed to replicate. Pt completed two tasks (simple and moderately difficult). Pt completed without verbal cues. CGA for standing balance. Encouraged pt to speak with increased volume without success. Pt remained in w/c with all needs within reach and belt alarm activated.   Therapy Documentation Precautions:  Precautions Precautions: Fall Precaution Comments: Per MD, ok to use walker but encourage pt to avoid pushing through L UE too much. Hx of Parkinsons Required Braces or Orthoses: Sling Restrictions Weight Bearing Restrictions: Yes LUE Weight Bearing: Non weight bearing Other Position/Activity Restrictions: NWB in sling, but ok to use walker without pushing through L UE too much General:   Vital Signs:  Pain: Pain Assessment Pain Scale: Faces Faces Pain Scale: No hurt ADL: ADL Eating: Set up Grooming: Supervision/safety Where Assessed-Grooming: Sitting at sink Upper Body Bathing: Supervision/safety Where Assessed-Upper Body Bathing: Other (Comment) (sitting on elevated toilet) Lower Body Bathing: Moderate assistance Where Assessed-Lower Body Bathing: Other (Comment) (sit<stand from elevated toilet) Upper Body Dressing: Contact guard Where Assessed-Upper Body Dressing: Other (Comment) (sitting on toilet and standing in front of toilet) Lower Body Dressing: Maximal assistance Where Assessed-Lower Body Dressing: Other (Comment) (sit<stand from elevated  toilet) Toileting: Maximal assistance Where Assessed-Toileting: Glass blower/designer: Minimal Print production planner Method: Ambulating (RW) Science writer: Raised toilet seat, Grab bars Tub/Shower Transfer: Not assessed Vision   Perception    Praxis   Exercises:   Other Treatments:     Therapy/Group: Individual Therapy  Leroy Libman 05/29/2020, 1:31 PM

## 2020-05-30 ENCOUNTER — Inpatient Hospital Stay (HOSPITAL_COMMUNITY): Payer: Medicare Other | Admitting: Occupational Therapy

## 2020-05-30 ENCOUNTER — Inpatient Hospital Stay (HOSPITAL_COMMUNITY): Payer: Medicare Other

## 2020-05-30 LAB — BASIC METABOLIC PANEL
Anion gap: 10 (ref 5–15)
BUN: 36 mg/dL — ABNORMAL HIGH (ref 8–23)
CO2: 27 mmol/L (ref 22–32)
Calcium: 9.6 mg/dL (ref 8.9–10.3)
Chloride: 102 mmol/L (ref 98–111)
Creatinine, Ser: 1.3 mg/dL — ABNORMAL HIGH (ref 0.44–1.00)
GFR, Estimated: 41 mL/min — ABNORMAL LOW (ref >60)
Glucose, Bld: 94 mg/dL (ref 70–99)
Potassium: 3.9 mmol/L (ref 3.5–5.1)
Sodium: 139 mmol/L (ref 135–145)

## 2020-05-30 MED ORDER — SODIUM CHLORIDE 0.9 % IV BOLUS
250.0000 mL | Freq: Once | INTRAVENOUS | Status: AC
  Administered 2020-05-30: 250 mL via INTRAVENOUS

## 2020-05-30 NOTE — Progress Notes (Signed)
Inpatient Rehabilitation Care Coordinator  Discharge Note  The overall goal for the admission was met for: DC SAT 11/13  Discharge location: Yes-BACK TO INDEPENDENT LIVING APARTMENT AT [place]  Length of Stay: Yes-14 DAYS  Discharge activity level: Yes-SUPERVISION-CGA  Home/community participation: Yes  Services provided included: MD, RD, PT, OT, SLP, RN, CM, Pharmacy, Neuropsych and SW  Financial Services: Private Insurance: [employer]  Follow-up services arranged: Outpatient: [facility] PT, OT,SPT   ACTIVE WITH PRIOR TO ADMISSION  Comments (or additional information):PT NEEDS 24/7 SUPERVISION DUE TO HIGH RISK TO FALL AT APARTMENT FAMILY HAS HIRED SOME ASSIST BUT NOT 24 HR. SHE IS AT HIGH RISK TO FALL AGAIN WITH HER FLUCUATING LEVEL FROM DAY TO DAY WITH HER PARKINSON'S DISEASE. WHERE EVER SHE IS SHE WILL BE HIGH RISK TO FALL-ALF,SNF, ETC  Patient/Family verbalized understanding of follow-up arrangements: Yes  Individual responsible for coordination of the follow-up plan: [facility] [phone]  Confirmed correct DME delivered: Elease Hashimoto 05/30/2020    Elease Hashimoto

## 2020-05-30 NOTE — Progress Notes (Signed)
[place] PHYSICAL MEDICINE & REHABILITATION PROGRESS NOTE   Subjective/Complaints: Patient seen sitting up in her chair this AM.  She states she slept well overnight.  She has questions regarding her room at facility at discharge tomorrow.   ROS: Denies CP, SOB, N/V/D  Objective:   No results found. Recent Labs    05/28/20 0414  WBC 5.5  HGB 10.6*  HCT 33.0*  PLT 318   Recent Labs    05/30/20 0333  NA 139  K 3.9  CL 102  CO2 27  GLUCOSE 94  BUN 36*  CREATININE 1.30*  CALCIUM 9.6    Intake/Output Summary (Last 24 hours) at 05/30/2020 1054 Last data filed at 05/30/2020 0800 Gross per 24 hour  Intake 480 ml  Output --  Net 480 ml        Physical Exam: Vital Signs Blood pressure (!) 107/49, pulse (!) 59, temperature 97.8 F (36.6 C), temperature source Oral, resp. rate 17, height 5\' 2"  (1.575 m), weight 58.9 kg, last menstrual period 07/19/1988, SpO2 99 %.  Constitutional: No distress . Vital signs reviewed. HENT: Normocephalic.  Atraumatic. Eyes: EOMI. No discharge. Cardiovascular: No JVD.  RRR. Respiratory: Normal effort.  No stridor.  Bilateral clear to auscultation. GI: Non-distended.  BS +. Skin: Warm and dry.  Intact. Psych: Flat. Slowed. Musc: No edema in extremities.  Left clavicle with improving mild TTP. Neuro: Alert Motor: Grossly 4+/5 throughout, left shoulder with mild limitation due to pain, unchanged  Assessment/Plan: 1. Functional deficits secondary to gait disorder, left clavicular fx which require 3+ hours per day of interdisciplinary therapy in a comprehensive inpatient rehab setting.  Physiatrist is providing close team supervision and 24 hour management of active medical problems listed below.  Physiatrist and rehab team continue to assess barriers to discharge/monitor patient progress toward functional and medical goals  Care Tool:  Bathing    Body parts bathed by patient: Right arm, Left arm, Chest, Abdomen, Face, Front  perineal area, Buttocks, Right upper leg, Left upper leg, Right lower leg, Left lower leg   Body parts bathed by helper: Front perineal area, Buttocks     Bathing assist Assist Level: Supervision/Verbal cueing     Upper Body Dressing/Undressing Upper body dressing   What is the patient wearing?: Pull over shirt    Upper body assist Assist Level: Supervision/Verbal cueing    Lower Body Dressing/Undressing Lower body dressing      What is the patient wearing?: Pants, Underwear/pull up     Lower body assist Assist for lower body dressing: Supervision/Verbal cueing     Toileting Toileting    Toileting assist Assist for toileting: Contact Guard/Touching assist     Transfers Chair/bed transfer  Transfers assist     Chair/bed transfer assist level: Supervision/Verbal cueing     Locomotion Ambulation   Ambulation assist      Assist level: Supervision/Verbal cueing Assistive device: Walker-rolling Max distance: 12ft   Walk 10 feet activity   Assist     Assist level: Supervision/Verbal cueing Assistive device: Walker-rolling   Walk 50 feet activity   Assist Walk 50 feet with 2 turns activity did not occur: Safety/medical concerns  Assist level: Supervision/Verbal cueing Assistive device: Walker-rolling    Walk 150 feet activity   Assist Walk 150 feet activity did not occur: Safety/medical concerns  Assist level: Supervision/Verbal cueing Assistive device: Walker-rolling    Walk 10 feet on uneven surface  activity   Assist Walk 10 feet on uneven surfaces activity did  not occur: Safety/medical concerns   Assist level: Contact Guard/Touching assist Assistive device: Aeronautical engineer Will patient use wheelchair at discharge?: No Type of Wheelchair: Manual    Wheelchair assist level: Supervision/Verbal cueing Max wheelchair distance: 5ft    Wheelchair 50 feet with 2 turns activity    Assist         Assist Level: Maximal Assistance - Patient 25 - 49%   Wheelchair 150 feet activity     Assist      Assist Level: Dependent - Patient 0%   Blood pressure (!) 107/49, pulse (!) 59, temperature 97.8 F (36.6 C), temperature source Oral, resp. rate 17, height 5\' 2"  (1.575 m), weight 58.9 kg, last menstrual period 07/19/1988, SpO2 99 %.  Medical Problem List and Plan: 1.  Decreased functional mobility secondary to left comminuted clavicle fracture and right knee contusion after fall.  Nonweightbearing.  Follow-up Dr. Mardelle Matte.  Shoulder sling as directed.  Patient was cleared to use left upper extremity for support during ambulation with a walker but no aggressive active range of motion or passive range of motion.  Continue CIR  Plan for d/c tomorrow  Will see patient for hospital follow up in 1 month post-discharge 2.  Antithrombotics: -DVT/anticoagulation: Subcutaneous heparin.  -antiplatelet therapy: N/A 3. Pain Management: Hydrocodone as needed  Local heat/ice to shoulder as needed  Controlled with meds on 11/12 4. Mood: Zoloft 25 mg daily, Ativan 0.5 mg- wean to q8H prn given fall risk, decreased to 0.25 on 11/9             -antipsychotic agents: N/A 5. Neuropsych: This patient is capable of making decisions on her own behalf. 6. Skin/Wound Care: Routine skin checks 7. Fluids/Electrolytes/Nutrition: Routine in and outs. 8.  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  Lasix 40 mg daily.    Filed Weights   05/28/20 0313 05/29/20 0500 05/30/20 0348  Weight: 59.4 kg 59.5 kg 58.9 kg   Stable on 11/12 9.  Parkinson's disease.    Sinemet 25-100 mg 2 tabs 3 times daily 10.  Hypertension.  Lisinopril 2.5 mg daily, Zebeta 2.5 mg daily   Vitals:   05/29/20 1936 05/30/20 0348  BP: (!) 113/51 (!) 107/49  Pulse: 66 (!) 59  Resp: 17 17  Temp: 97.9 F (36.6 C) 97.8 F (36.6 C)  SpO2: 100% 99%   Controlled on 11/12 11.  Hyperlipidemia.   Pravachol 12.  Fusiform thoracic aneurysm ascending aorta, chronic and stable.  Follow-up outpatient Dr. Darcey Nora 13. Constipation:  Improving 14.  Anemia  Hemoglobin 10.6 on 11/10  Cont to monitor 15.  AKI  Creatinine 1.30 on 11/12  Echo reviewed, EF 30-35%, small fluid bolus ordered.   Continue to encourage fluids 16.  Sleep disturbance  Melatonin added on 11/10  See #4  ECG reviewed, improving Qtc - low dose trazodone ordered  Improving   LOS: 13 days A FACE TO FACE EVALUATION WAS PERFORMED  Abbie Jablon [name] 05/30/2020, 10:54 AM

## 2020-05-30 NOTE — Progress Notes (Signed)
Speech Language Pathology Discharge Summary  Patient Details  Name: Elizabeth Mccullough MRN: [id number] Date of Birth: 05-13-1941  Today's Date: 05/30/2020 SLP Individual Time: 1100-1200 SLP Individual Time Calculation (min): 60 min   Skilled Therapeutic Interventions: Pt seen for final IP treatment for cognitive and speech/language goals, pt to d/c to ILF apartment tomorrow. Recommendations are for patient to have 24/7 supervision and assist at discharge due to cognitive and physical impairments, family was only able to hire day time aide. SLP facilitating treatment but providing mod A verbal cues for problem solving and safety tasks with focus on fall risk and fall prevention. Pt continues to demonstrate poor insight into cognitive deficits, verbal cues did help in increasing awareness of potential physical hazards in apartment. Pt expressed interest in obtaining a LifeAlert pendant (states she had one in [place]). SLP re-administered the SLUMS, deficits remain in executive function, short term recall, mathematics and mental flexibility. Pt clock drawing with numbers 1-20 with no independent awareness of error. Pt with ongoing requirement of minimal assistance to understand, recall and utilize speech intelligibility strategies at phrase level. SLP providing education re: ST POC, progress toward LTGs and remaining deficits. Pt verbalized understanding however there is likely poor carryover of information. Pt left in bed with bed alarm on and all needs within reach.   Patient has met 1 of 4 long term goals.  Patient to discharge at overall Mod level.  Reasons goals not met: continues to require mod assistance for safety, problem solving and awareness of current deficits   Clinical Impression/Discharge Summary:   Pt continues to make minimal but functional improvement towards cognitive goals. Pt is able to produce intelligible speech with minimal assist for strategies. Pt met 1 of 4 treatment goals,  requires moderate cues for problem solving, attention and awareness. Pt will discharge to prior living arrangement, ILF apartment with hired help during the day. It is recommended patient receive 24/7 supervision and assist due to documented cognitive impairments. Pt has significant history of falls with injury and continues to be a very high fall risk. Recommend OP ST services to maximize cognitive and communication function.   Care Partner:  Caregiver Able to Provide Assistance: No  Type of Caregiver Assistance: Physical;Cognitive  Recommendation:  Outpatient SLP;24 hour supervision/assistance  Rationale for SLP Follow Up: Maximize cognitive function and independence;Maximize functional communication;Reduce caregiver burden   Equipment: n/a   Reasons for discharge: Discharged from hospital   Patient/Family Agrees with Progress Made and Goals Achieved: Yes    Dewaine Conger 05/30/2020, 12:27 PM

## 2020-05-30 NOTE — Plan of Care (Signed)
  Problem: Consults Goal: RH GENERAL PATIENT EDUCATION Description: See Patient Education module for education specifics. Outcome: Progressing   Problem: RH BOWEL ELIMINATION Goal: RH STG MANAGE BOWEL WITH ASSISTANCE Description: STG Manage Bowel with min Assistance. Outcome: Progressing Goal: RH STG MANAGE BOWEL W/MEDICATION W/ASSISTANCE Description: STG Manage Bowel with Medication with min Assistance. Outcome: Progressing   Problem: RH BLADDER ELIMINATION Goal: RH STG MANAGE BLADDER WITH ASSISTANCE Description: STG Manage Bladder With min Assistance Outcome: Progressing   Problem: RH SKIN INTEGRITY Goal: RH STG SKIN FREE OF INFECTION/BREAKDOWN Description: Skin to remain free of breakdown with min assist Outcome: Progressing Goal: RH STG MAINTAIN SKIN INTEGRITY WITH ASSISTANCE Description: STG Maintain Skin Integrity With min Assistance. Outcome: Progressing   Problem: RH SAFETY Goal: RH STG ADHERE TO SAFETY PRECAUTIONS W/ASSISTANCE/DEVICE Description: STG Adhere to Safety Precautions With min Assistance/Device. Outcome: Progressing   Problem: RH PAIN MANAGEMENT Goal: RH STG PAIN MANAGED AT OR BELOW PT'S PAIN GOAL Description: Less than 3 out of 10 Outcome: Progressing   Problem: RH KNOWLEDGE DEFICIT GENERAL Goal: RH STG INCREASE KNOWLEDGE OF SELF CARE AFTER HOSPITALIZATION Outcome: Progressing

## 2020-05-30 NOTE — Progress Notes (Signed)
Occupational Therapy Session Note  Patient Details  Name: Elizabeth Mccullough MRN: [id number] Date of Birth: 07/29/1940  Today's Date: 05/30/2020 OT Individual Time: 1345-1455 OT Individual Time Calculation (min): 70 min    Short Term Goals: Week 2:  OT Short Term Goal 1 (Week 2): STG = LTGs due to remaining LOS  Skilled Therapeutic Interventions/Progress Updates:    Treatment session with focus on functional mobility, transfers, dynamic standing balance, and endurance.  Pt received supine in bed agreeable to therapy session with moderate encouragement.  Pt declined any bathing or dressing this session, stating she will do it tomorrow when she is home.  Encouraged pt to complete dressing to allow for practice to ensure pt feels safe with self-care tasks, pt still refusing.  Pt reports need to toilet.  Pt ambulated to toilet with RW with close supervision, one LOB when navigating threshold in to bathroom but able to correct without assistance.  Pt completed toileting with distant supervision and increased time when completing clothing management.  Attempted to engage in Wii bowling, however pt unable to manage Wii remote due to decreased sequencing and slowed reactions ?due to Parkinson's.  Pt engaged in card activity in standing at table, as pt reports enjoying cards with friends.  Pt initially required mod multimodal cues to sequence familiar card activity, fading to no cues.  Therapist providing cues throughout session for sit > stand due to pt tendency to attempt to push up with LUE or pull up on surface.  Pt able to complete sits > stand with cues for anterior weight shift and pushing up from w/c with RUE while LUE on RW.  Pt returned to room ambulating and transferred back to bed supervision.  Pt left semi-reclined in bed with all needs in reach.   Therapy Documentation Precautions:  Precautions Precautions: Fall Precaution Comments: Per MD, ok to use walker but encourage pt to avoid  pushing through L UE too much. Hx of Parkinsons Required Braces or Orthoses: Sling Restrictions Weight Bearing Restrictions: Yes LUE Weight Bearing: Non weight bearing Other Position/Activity Restrictions: NWB in sling, but ok to use walker without pushing through L UE too much General:   Vital Signs: Therapy Vitals Temp: (!) 97.3 F (36.3 C) Temp Source: Oral Pulse Rate: 61 Resp: 19 BP: (!) 118/56 Patient Position (if appropriate): Lying Oxygen Therapy SpO2: 100 % O2 Device: Room Air Pain:  Pt with no c/o pain   Therapy/Group: Individual Therapy  [name] 05/30/2020, 4:05 PM

## 2020-05-30 NOTE — Progress Notes (Signed)
Occupational Therapy Discharge Summary  Patient Details  Name: Elizabeth Mccullough MRN: [id number] Date of Birth: 1941-03-19  Patient has met 7 of 7 long term goals due to improved balance, postural control and ability to compensate for deficits.  Patient to discharge at overall Supervision level.  Patient's care partner is independent to provide the necessary cognitive assistance at discharge.  Patient to discharge back to [facility] with hired caregivers to provide intermittent supervision. However, this therapist has recommended need for 24/7 supervision/assist due to pt's poor safety awareness and problem solving, posterior lean in standing, festinating gait pattern, decreased balance/postural control, decreased motor planning and sequencing, and poor insight into deficits as well as h/o frequent falls.   Reasons goals not met: N/A  Recommendation:  Patient will benefit from ongoing skilled OT services in home health setting to continue to advance functional skills in the area of BADL and Reduce care partner burden.  Equipment: No equipment provided  Reasons for discharge: treatment goals met and discharge from hospital  Patient/family agrees with progress made and goals achieved: Yes  OT Discharge Precautions/Restrictions  Precautions Precautions: Fall Precaution Comments: Per MD, ok to use walker but encourage pt to avoid pushing through L UE too much. Hx of Parkinsons Required Braces or Orthoses: Sling Restrictions LUE Weight Bearing: Non weight bearing Other Position/Activity Restrictions: NWB in sling, but ok to use walker without pushing through L UE too much General   Vital Signs Therapy Vitals Temp: (!) 97.3 F (36.3 C) Temp Source: Oral Pulse Rate: 61 Resp: 19 BP: (!) 118/56 Patient Position (if appropriate): Lying Oxygen Therapy SpO2: 100 % O2 Device: Room Air Pain Pain Assessment Pain Scale: Faces Pain Score: 0-No pain ADL ADL Eating:  Set up Grooming: Supervision/safety Where Assessed-Grooming: Standing at sink Upper Body Bathing: Supervision/safety Where Assessed-Upper Body Bathing: Sitting at sink Lower Body Bathing: Supervision/safety Where Assessed-Lower Body Bathing: Sitting at sink, Standing at sink Upper Body Dressing: Supervision/safety Where Assessed-Upper Body Dressing: Sitting at sink Lower Body Dressing: Supervision/safety Where Assessed-Lower Body Dressing: Sitting at sink, Standing at sink Toileting: Supervision/safety Where Assessed-Toileting: Glass blower/designer: Close supervision Armed forces technical officer Method: Counselling psychologist: Raised toilet seat, Energy manager: Not assessed Social research officer, government: Curator Method: Heritage manager: Civil engineer, contracting with back Vision Baseline Vision/History: Wears glasses Wears Glasses: At all times Vision Assessment?: No apparent visual deficits Perception  Perception: Within Functional Limits Praxis Praxis: Impaired Praxis Impairment Details: Initiation;Motor planning;Perseveration Cognition Overall Cognitive Status: Impaired/Different from baseline Arousal/Alertness: Lethargic Attention: Sustained Sustained Attention: Impaired Memory: Impaired Awareness: Impaired Problem Solving: Impaired Safety/Judgment: Impaired Comments: Pt requires max cues to adhere to LUE NWB precautions Sensation Sensation Light Touch: Appears Intact Proprioception: Appears Intact Coordination Gross Motor Movements are Fluid and Coordinated: No Fine Motor Movements are Fluid and Coordinated: Yes Coordination and Movement Description: grossly uncoordinated due to posterior lean, decreased balance/postural control, festinating gait pattern, and poor motor planning/sequencing Finger Nose Finger Test: [facility] but slow Heel Shin Test: [facility] but slow Motor  Motor Motor: Abnormal postural alignment and  control;Other (comment) Motor - Skilled Clinical Observations: impaired motor planning/sequencing, posterior lean in standing, festinating gait, and decreased balance/postural control Mobility     Trunk/Postural Assessment  Cervical Assessment Cervical Assessment: Exceptions to [facility] (forward head) Thoracic Assessment Thoracic Assessment: Exceptions to [facility] (kyphosis) Lumbar Assessment Lumbar Assessment: Exceptions to [facility] (posterior pelvic tilt) Postural Control Postural Control: Deficits on evaluation  Balance   Extremity/Trunk Assessment RUE  Assessment RUE Assessment: Within Functional Limits LUE Assessment LUE Assessment: Exceptions to [facility] (NWB, not formally tested, however pt able to utilize functionally during all self-care tasks)   [name] 05/30/2020, 4:15 PM

## 2020-05-31 NOTE — Progress Notes (Signed)
Daughter here and taking patient and belongings with staff to her car to assisted living facility. Patient ready to leave, stable, and without s/s of distress.

## 2020-06-02 DIAGNOSIS — M6281 Muscle weakness (generalized): Secondary | ICD-10-CM | POA: Diagnosis not present

## 2020-06-02 DIAGNOSIS — R2689 Other abnormalities of gait and mobility: Secondary | ICD-10-CM | POA: Diagnosis not present

## 2020-06-03 ENCOUNTER — Encounter: Payer: Medicare Other | Attending: Psychology | Admitting: Psychology

## 2020-06-03 DIAGNOSIS — F329 Major depressive disorder, single episode, unspecified: Secondary | ICD-10-CM | POA: Insufficient documentation

## 2020-06-03 DIAGNOSIS — F419 Anxiety disorder, unspecified: Secondary | ICD-10-CM | POA: Insufficient documentation

## 2020-06-03 DIAGNOSIS — R413 Other amnesia: Secondary | ICD-10-CM | POA: Insufficient documentation

## 2020-06-03 DIAGNOSIS — R2689 Other abnormalities of gait and mobility: Secondary | ICD-10-CM | POA: Diagnosis not present

## 2020-06-03 DIAGNOSIS — G20 Parkinson's disease: Secondary | ICD-10-CM | POA: Insufficient documentation

## 2020-06-03 DIAGNOSIS — M6281 Muscle weakness (generalized): Secondary | ICD-10-CM | POA: Diagnosis not present

## 2020-06-04 ENCOUNTER — Other Ambulatory Visit: Payer: Self-pay | Admitting: Cardiothoracic Surgery

## 2020-06-04 DIAGNOSIS — I712 Thoracic aortic aneurysm, without rupture, unspecified: Secondary | ICD-10-CM

## 2020-06-04 DIAGNOSIS — M6281 Muscle weakness (generalized): Secondary | ICD-10-CM | POA: Diagnosis not present

## 2020-06-04 DIAGNOSIS — R2689 Other abnormalities of gait and mobility: Secondary | ICD-10-CM | POA: Diagnosis not present

## 2020-06-05 DIAGNOSIS — R41841 Cognitive communication deficit: Secondary | ICD-10-CM | POA: Diagnosis not present

## 2020-06-05 DIAGNOSIS — R2689 Other abnormalities of gait and mobility: Secondary | ICD-10-CM | POA: Diagnosis not present

## 2020-06-05 DIAGNOSIS — M6281 Muscle weakness (generalized): Secondary | ICD-10-CM | POA: Diagnosis not present

## 2020-06-05 DIAGNOSIS — R471 Dysarthria and anarthria: Secondary | ICD-10-CM | POA: Diagnosis not present

## 2020-06-06 DIAGNOSIS — R471 Dysarthria and anarthria: Secondary | ICD-10-CM | POA: Diagnosis not present

## 2020-06-06 DIAGNOSIS — M6281 Muscle weakness (generalized): Secondary | ICD-10-CM | POA: Diagnosis not present

## 2020-06-06 DIAGNOSIS — R41841 Cognitive communication deficit: Secondary | ICD-10-CM | POA: Diagnosis not present

## 2020-06-06 DIAGNOSIS — R2689 Other abnormalities of gait and mobility: Secondary | ICD-10-CM | POA: Diagnosis not present

## 2020-06-08 DIAGNOSIS — R2689 Other abnormalities of gait and mobility: Secondary | ICD-10-CM | POA: Diagnosis not present

## 2020-06-08 DIAGNOSIS — M6281 Muscle weakness (generalized): Secondary | ICD-10-CM | POA: Diagnosis not present

## 2020-06-09 DIAGNOSIS — M6281 Muscle weakness (generalized): Secondary | ICD-10-CM | POA: Diagnosis not present

## 2020-06-09 DIAGNOSIS — R471 Dysarthria and anarthria: Secondary | ICD-10-CM | POA: Diagnosis not present

## 2020-06-09 DIAGNOSIS — R41841 Cognitive communication deficit: Secondary | ICD-10-CM | POA: Diagnosis not present

## 2020-06-09 DIAGNOSIS — R2689 Other abnormalities of gait and mobility: Secondary | ICD-10-CM | POA: Diagnosis not present

## 2020-06-10 DIAGNOSIS — R2689 Other abnormalities of gait and mobility: Secondary | ICD-10-CM | POA: Diagnosis not present

## 2020-06-10 DIAGNOSIS — M6281 Muscle weakness (generalized): Secondary | ICD-10-CM | POA: Diagnosis not present

## 2020-06-11 DIAGNOSIS — M6281 Muscle weakness (generalized): Secondary | ICD-10-CM | POA: Diagnosis not present

## 2020-06-11 DIAGNOSIS — R2689 Other abnormalities of gait and mobility: Secondary | ICD-10-CM | POA: Diagnosis not present

## 2020-06-16 DIAGNOSIS — M6281 Muscle weakness (generalized): Secondary | ICD-10-CM | POA: Diagnosis not present

## 2020-06-16 DIAGNOSIS — R2689 Other abnormalities of gait and mobility: Secondary | ICD-10-CM | POA: Diagnosis not present

## 2020-06-16 DIAGNOSIS — R41841 Cognitive communication deficit: Secondary | ICD-10-CM | POA: Diagnosis not present

## 2020-06-16 DIAGNOSIS — R471 Dysarthria and anarthria: Secondary | ICD-10-CM | POA: Diagnosis not present

## 2020-06-17 DIAGNOSIS — M6281 Muscle weakness (generalized): Secondary | ICD-10-CM | POA: Diagnosis not present

## 2020-06-17 DIAGNOSIS — R2689 Other abnormalities of gait and mobility: Secondary | ICD-10-CM | POA: Diagnosis not present

## 2020-06-18 DIAGNOSIS — R471 Dysarthria and anarthria: Secondary | ICD-10-CM | POA: Diagnosis not present

## 2020-06-18 DIAGNOSIS — R41841 Cognitive communication deficit: Secondary | ICD-10-CM | POA: Diagnosis not present

## 2020-06-18 DIAGNOSIS — R2689 Other abnormalities of gait and mobility: Secondary | ICD-10-CM | POA: Diagnosis not present

## 2020-06-18 DIAGNOSIS — M6281 Muscle weakness (generalized): Secondary | ICD-10-CM | POA: Diagnosis not present

## 2020-06-19 DIAGNOSIS — R2689 Other abnormalities of gait and mobility: Secondary | ICD-10-CM | POA: Diagnosis not present

## 2020-06-19 DIAGNOSIS — M6281 Muscle weakness (generalized): Secondary | ICD-10-CM | POA: Diagnosis not present

## 2020-06-20 DIAGNOSIS — M6281 Muscle weakness (generalized): Secondary | ICD-10-CM | POA: Diagnosis not present

## 2020-06-20 DIAGNOSIS — R2689 Other abnormalities of gait and mobility: Secondary | ICD-10-CM | POA: Diagnosis not present

## 2020-06-23 DIAGNOSIS — M6281 Muscle weakness (generalized): Secondary | ICD-10-CM | POA: Diagnosis not present

## 2020-06-23 DIAGNOSIS — R2689 Other abnormalities of gait and mobility: Secondary | ICD-10-CM | POA: Diagnosis not present

## 2020-06-24 ENCOUNTER — Encounter: Payer: Medicare Other | Attending: Psychology | Admitting: Psychology

## 2020-06-24 DIAGNOSIS — R41841 Cognitive communication deficit: Secondary | ICD-10-CM | POA: Diagnosis not present

## 2020-06-24 DIAGNOSIS — R471 Dysarthria and anarthria: Secondary | ICD-10-CM | POA: Diagnosis not present

## 2020-06-24 DIAGNOSIS — R2689 Other abnormalities of gait and mobility: Secondary | ICD-10-CM | POA: Diagnosis not present

## 2020-06-24 DIAGNOSIS — F329 Major depressive disorder, single episode, unspecified: Secondary | ICD-10-CM | POA: Insufficient documentation

## 2020-06-24 DIAGNOSIS — M6281 Muscle weakness (generalized): Secondary | ICD-10-CM | POA: Diagnosis not present

## 2020-06-24 DIAGNOSIS — G20 Parkinson's disease: Secondary | ICD-10-CM | POA: Insufficient documentation

## 2020-06-24 DIAGNOSIS — R413 Other amnesia: Secondary | ICD-10-CM | POA: Insufficient documentation

## 2020-06-24 DIAGNOSIS — F419 Anxiety disorder, unspecified: Secondary | ICD-10-CM | POA: Insufficient documentation

## 2020-06-25 ENCOUNTER — Other Ambulatory Visit: Payer: Medicare Other

## 2020-06-25 ENCOUNTER — Ambulatory Visit: Payer: Medicare Other | Admitting: Cardiothoracic Surgery

## 2020-06-25 DIAGNOSIS — M6281 Muscle weakness (generalized): Secondary | ICD-10-CM | POA: Diagnosis not present

## 2020-06-25 DIAGNOSIS — R2689 Other abnormalities of gait and mobility: Secondary | ICD-10-CM | POA: Diagnosis not present

## 2020-06-26 DIAGNOSIS — R2689 Other abnormalities of gait and mobility: Secondary | ICD-10-CM | POA: Diagnosis not present

## 2020-06-26 DIAGNOSIS — R471 Dysarthria and anarthria: Secondary | ICD-10-CM | POA: Diagnosis not present

## 2020-06-26 DIAGNOSIS — M6281 Muscle weakness (generalized): Secondary | ICD-10-CM | POA: Diagnosis not present

## 2020-06-26 DIAGNOSIS — R41841 Cognitive communication deficit: Secondary | ICD-10-CM | POA: Diagnosis not present

## 2020-06-27 DIAGNOSIS — R471 Dysarthria and anarthria: Secondary | ICD-10-CM | POA: Diagnosis not present

## 2020-06-27 DIAGNOSIS — R2689 Other abnormalities of gait and mobility: Secondary | ICD-10-CM | POA: Diagnosis not present

## 2020-06-27 DIAGNOSIS — R41841 Cognitive communication deficit: Secondary | ICD-10-CM | POA: Diagnosis not present

## 2020-06-27 DIAGNOSIS — M6281 Muscle weakness (generalized): Secondary | ICD-10-CM | POA: Diagnosis not present

## 2020-06-30 ENCOUNTER — Other Ambulatory Visit: Payer: Self-pay | Admitting: Family Medicine

## 2020-06-30 DIAGNOSIS — R41841 Cognitive communication deficit: Secondary | ICD-10-CM | POA: Diagnosis not present

## 2020-06-30 DIAGNOSIS — R2689 Other abnormalities of gait and mobility: Secondary | ICD-10-CM | POA: Diagnosis not present

## 2020-06-30 DIAGNOSIS — R471 Dysarthria and anarthria: Secondary | ICD-10-CM | POA: Diagnosis not present

## 2020-06-30 DIAGNOSIS — M6281 Muscle weakness (generalized): Secondary | ICD-10-CM | POA: Diagnosis not present

## 2020-07-01 DIAGNOSIS — M6281 Muscle weakness (generalized): Secondary | ICD-10-CM | POA: Diagnosis not present

## 2020-07-01 DIAGNOSIS — R41841 Cognitive communication deficit: Secondary | ICD-10-CM | POA: Diagnosis not present

## 2020-07-01 DIAGNOSIS — R2689 Other abnormalities of gait and mobility: Secondary | ICD-10-CM | POA: Diagnosis not present

## 2020-07-01 DIAGNOSIS — R471 Dysarthria and anarthria: Secondary | ICD-10-CM | POA: Diagnosis not present

## 2020-07-02 ENCOUNTER — Other Ambulatory Visit: Payer: Medicare Other

## 2020-07-02 ENCOUNTER — Ambulatory Visit: Payer: Medicare Other | Admitting: Cardiothoracic Surgery

## 2020-07-02 DIAGNOSIS — M6281 Muscle weakness (generalized): Secondary | ICD-10-CM | POA: Diagnosis not present

## 2020-07-02 DIAGNOSIS — R2689 Other abnormalities of gait and mobility: Secondary | ICD-10-CM | POA: Diagnosis not present

## 2020-07-03 ENCOUNTER — Encounter: Payer: Medicare Other | Admitting: Physical Medicine & Rehabilitation

## 2020-07-03 DIAGNOSIS — R471 Dysarthria and anarthria: Secondary | ICD-10-CM | POA: Diagnosis not present

## 2020-07-03 DIAGNOSIS — R2689 Other abnormalities of gait and mobility: Secondary | ICD-10-CM | POA: Diagnosis not present

## 2020-07-03 DIAGNOSIS — M6281 Muscle weakness (generalized): Secondary | ICD-10-CM | POA: Diagnosis not present

## 2020-07-03 DIAGNOSIS — R41841 Cognitive communication deficit: Secondary | ICD-10-CM | POA: Diagnosis not present

## 2020-07-04 ENCOUNTER — Other Ambulatory Visit: Payer: Self-pay | Admitting: Family Medicine

## 2020-07-04 DIAGNOSIS — M6281 Muscle weakness (generalized): Secondary | ICD-10-CM | POA: Diagnosis not present

## 2020-07-04 DIAGNOSIS — R2689 Other abnormalities of gait and mobility: Secondary | ICD-10-CM | POA: Diagnosis not present

## 2020-07-04 DIAGNOSIS — E785 Hyperlipidemia, unspecified: Secondary | ICD-10-CM

## 2020-07-07 DIAGNOSIS — R2689 Other abnormalities of gait and mobility: Secondary | ICD-10-CM | POA: Diagnosis not present

## 2020-07-07 DIAGNOSIS — M6281 Muscle weakness (generalized): Secondary | ICD-10-CM | POA: Diagnosis not present

## 2020-07-07 DIAGNOSIS — R41841 Cognitive communication deficit: Secondary | ICD-10-CM | POA: Diagnosis not present

## 2020-07-07 DIAGNOSIS — R471 Dysarthria and anarthria: Secondary | ICD-10-CM | POA: Diagnosis not present

## 2020-07-08 DIAGNOSIS — R2689 Other abnormalities of gait and mobility: Secondary | ICD-10-CM | POA: Diagnosis not present

## 2020-07-08 DIAGNOSIS — R41841 Cognitive communication deficit: Secondary | ICD-10-CM | POA: Diagnosis not present

## 2020-07-08 DIAGNOSIS — R471 Dysarthria and anarthria: Secondary | ICD-10-CM | POA: Diagnosis not present

## 2020-07-08 DIAGNOSIS — M6281 Muscle weakness (generalized): Secondary | ICD-10-CM | POA: Diagnosis not present

## 2020-07-09 ENCOUNTER — Telehealth: Payer: Medicare Other | Admitting: Cardiothoracic Surgery

## 2020-07-09 ENCOUNTER — Other Ambulatory Visit: Payer: Self-pay

## 2020-07-09 ENCOUNTER — Inpatient Hospital Stay: Admission: RE | Admit: 2020-07-09 | Payer: Medicare Other | Source: Ambulatory Visit

## 2020-07-09 ENCOUNTER — Telehealth: Payer: Self-pay | Admitting: Cardiothoracic Surgery

## 2020-07-09 ENCOUNTER — Ambulatory Visit
Admission: RE | Admit: 2020-07-09 | Discharge: 2020-07-09 | Disposition: A | Discharge status: Home / Self Care | Payer: Medicare Other | Source: Ambulatory Visit | Attending: Cardiothoracic Surgery | Admitting: Cardiothoracic Surgery

## 2020-07-09 DIAGNOSIS — R2689 Other abnormalities of gait and mobility: Secondary | ICD-10-CM | POA: Diagnosis not present

## 2020-07-09 DIAGNOSIS — R918 Other nonspecific abnormal finding of lung field: Secondary | ICD-10-CM | POA: Diagnosis not present

## 2020-07-09 DIAGNOSIS — I712 Thoracic aortic aneurysm, without rupture, unspecified: Secondary | ICD-10-CM

## 2020-07-09 DIAGNOSIS — M6281 Muscle weakness (generalized): Secondary | ICD-10-CM | POA: Diagnosis not present

## 2020-07-09 NOTE — Telephone Encounter (Signed)
[address]       ,[place] [address]             [phone]     CARDIOTHORACIC SURGERY TELEPHONE VIRTUAL OFFICE NOTE  Referring Provider is No ref. provider found Primary Cardiologist is Mertie Moores, MD PCP is Carollee Herter, Alferd Apa, DO   HPI:  I spoke with Elizabeth Mccullough (DOB 05-21-1941 ) via telephone on 07/09/2020 at 10:46 AM and verified that I was speaking with the correct person using more than one form of identification.  We discussed the reason(s) for conducting our visit virtually instead of in-person.  The patient expressed understanding the circumstances and agreed to proceed as described.I spoke with [name] the patients daughter HCPOA as well.  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 No c/o chest pain. 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 Has had falls in past year and sustained fracture of L clavicle.     Current Outpatient Medications  Medication Sig Dispense Refill  . acetaminophen (TYLENOL) 325 MG tablet Take 2 tablets (650 mg total) by mouth every 6 (six) hours as needed for mild pain or headache.    Marland Kitchen aspirin EC 81 MG tablet Take 1 tablet (81 mg total) by mouth daily.    . bisoprolol (ZEBETA) 5 MG tablet Take 0.5 tablets (2.5 mg total) by mouth daily. 45 tablet 3  . Bromfenac Sodium (PROLENSA) 0.07 % SOLN Apply 1 drop to eye daily. Right eye    . Carbidopa-Levodopa ER (SINEMET CR) 25-100 MG tablet controlled release TAKE 2 TABLETS BY MOUTH 3 TIMES A DAY (Patient taking differently: Take 2 tablets by mouth in the morning, at noon, and at bedtime. TAKE 2 TABLETS BY MOUTH 3 TIMES A DAY) 540 tablet 0  . docusate sodium (COLACE) 100 MG capsule Take 1 capsule (100 mg total) by mouth daily. 10 capsule 0  . furosemide (LASIX) 40 MG tablet Take 1 tablet (40 mg total) by mouth daily. 30 tablet 0  . HYDROcodone-acetaminophen (NORCO/VICODIN) 5-325 MG  tablet Take 1 tablet by mouth every 6 (six) hours as needed for moderate pain. 30 tablet 0  . lisinopril (ZESTRIL) 10 MG tablet Take 1 tablet (10 mg total) by mouth daily. 30 tablet 0  . LORazepam (ATIVAN) 0.5 MG tablet Take 0.5 tablets (0.25 mg total) by mouth every 8 (eight) hours as needed for anxiety. 20 tablet 0  . lovastatin (MEVACOR) 20 MG tablet TAKE 1 TABLET BY MOUTH AT BEDTIME. 90 tablet 0  . melatonin 3 MG TABS tablet Take 1 tablet (3 mg total) by mouth at bedtime. 30 tablet 0  . moxifloxacin (VIGAMOX) 0.5 % ophthalmic solution Place 1 drop into the right eye in the morning, at noon, in the evening, and at bedtime.    . potassium chloride (KLOR-CON) 10 MEQ tablet Take 1 tablet (10 mEq total) by mouth daily. 90 tablet 3  . sertraline (ZOLOFT) 50 MG tablet Take 1 tablet (50 mg total) by mouth daily. 30 tablet 0   No current facility-administered medications for this visit.     Diagnostic Tests:  Images of todays CT chest reviewed and d/w patient  and family. TAA remains at 4.2 cm   Impression:  Low risk for thoracic aortic tear Cont BP control for SAP < 150 Patient and family do not want further scans or consideration for aortic surgery  so will stop surgical followup which they agree  Plan:  Cardiology followup    I discussed limitations of evaluation and management via telephone.  The patient was advised to call back for repeat telephone consultation or to seek an in-person evaluation if questions arise or the patient's clinical condition changes in any significant manner.  I spent in excess of 5 minutes of non-face-to-face time during the conduct of this telephone virtual office consultation.  Level 1  (99441)             5-10 minutes    07/09/2020 10:46 AM

## 2020-07-10 DIAGNOSIS — M6281 Muscle weakness (generalized): Secondary | ICD-10-CM | POA: Diagnosis not present

## 2020-07-14 DIAGNOSIS — R2689 Other abnormalities of gait and mobility: Secondary | ICD-10-CM | POA: Diagnosis not present

## 2020-07-14 DIAGNOSIS — M6281 Muscle weakness (generalized): Secondary | ICD-10-CM | POA: Diagnosis not present

## 2020-07-15 DIAGNOSIS — R2689 Other abnormalities of gait and mobility: Secondary | ICD-10-CM | POA: Diagnosis not present

## 2020-07-15 DIAGNOSIS — R471 Dysarthria and anarthria: Secondary | ICD-10-CM | POA: Diagnosis not present

## 2020-07-15 DIAGNOSIS — M6281 Muscle weakness (generalized): Secondary | ICD-10-CM | POA: Diagnosis not present

## 2020-07-15 DIAGNOSIS — R41841 Cognitive communication deficit: Secondary | ICD-10-CM | POA: Diagnosis not present

## 2020-07-16 ENCOUNTER — Other Ambulatory Visit: Payer: Self-pay | Admitting: Family Medicine

## 2020-07-16 DIAGNOSIS — R471 Dysarthria and anarthria: Secondary | ICD-10-CM | POA: Diagnosis not present

## 2020-07-16 DIAGNOSIS — R41841 Cognitive communication deficit: Secondary | ICD-10-CM | POA: Diagnosis not present

## 2020-07-16 DIAGNOSIS — M6281 Muscle weakness (generalized): Secondary | ICD-10-CM | POA: Diagnosis not present

## 2020-07-16 DIAGNOSIS — R2689 Other abnormalities of gait and mobility: Secondary | ICD-10-CM | POA: Diagnosis not present

## 2020-07-21 DIAGNOSIS — M6281 Muscle weakness (generalized): Secondary | ICD-10-CM | POA: Diagnosis not present

## 2020-07-21 DIAGNOSIS — R2689 Other abnormalities of gait and mobility: Secondary | ICD-10-CM | POA: Diagnosis not present

## 2020-07-22 DIAGNOSIS — R471 Dysarthria and anarthria: Secondary | ICD-10-CM | POA: Diagnosis not present

## 2020-07-22 DIAGNOSIS — R41841 Cognitive communication deficit: Secondary | ICD-10-CM | POA: Diagnosis not present

## 2020-07-23 DIAGNOSIS — R471 Dysarthria and anarthria: Secondary | ICD-10-CM | POA: Diagnosis not present

## 2020-07-23 DIAGNOSIS — R41841 Cognitive communication deficit: Secondary | ICD-10-CM | POA: Diagnosis not present

## 2020-07-23 DIAGNOSIS — R2689 Other abnormalities of gait and mobility: Secondary | ICD-10-CM | POA: Diagnosis not present

## 2020-07-23 DIAGNOSIS — M6281 Muscle weakness (generalized): Secondary | ICD-10-CM | POA: Diagnosis not present

## 2020-07-24 DIAGNOSIS — R471 Dysarthria and anarthria: Secondary | ICD-10-CM | POA: Diagnosis not present

## 2020-07-24 DIAGNOSIS — M6281 Muscle weakness (generalized): Secondary | ICD-10-CM | POA: Diagnosis not present

## 2020-07-24 DIAGNOSIS — R41841 Cognitive communication deficit: Secondary | ICD-10-CM | POA: Diagnosis not present

## 2020-07-24 DIAGNOSIS — R2689 Other abnormalities of gait and mobility: Secondary | ICD-10-CM | POA: Diagnosis not present

## 2020-07-25 DIAGNOSIS — M6281 Muscle weakness (generalized): Secondary | ICD-10-CM | POA: Diagnosis not present

## 2020-07-28 DIAGNOSIS — M6281 Muscle weakness (generalized): Secondary | ICD-10-CM | POA: Diagnosis not present

## 2020-07-28 DIAGNOSIS — R41841 Cognitive communication deficit: Secondary | ICD-10-CM | POA: Diagnosis not present

## 2020-07-28 DIAGNOSIS — R471 Dysarthria and anarthria: Secondary | ICD-10-CM | POA: Diagnosis not present

## 2020-07-29 DIAGNOSIS — M6281 Muscle weakness (generalized): Secondary | ICD-10-CM | POA: Diagnosis not present

## 2020-07-29 DIAGNOSIS — R41841 Cognitive communication deficit: Secondary | ICD-10-CM | POA: Diagnosis not present

## 2020-07-29 DIAGNOSIS — R2689 Other abnormalities of gait and mobility: Secondary | ICD-10-CM | POA: Diagnosis not present

## 2020-07-29 DIAGNOSIS — R471 Dysarthria and anarthria: Secondary | ICD-10-CM | POA: Diagnosis not present

## 2020-07-30 DIAGNOSIS — M6281 Muscle weakness (generalized): Secondary | ICD-10-CM | POA: Diagnosis not present

## 2020-07-30 DIAGNOSIS — R2689 Other abnormalities of gait and mobility: Secondary | ICD-10-CM | POA: Diagnosis not present

## 2020-07-31 DIAGNOSIS — M6281 Muscle weakness (generalized): Secondary | ICD-10-CM | POA: Diagnosis not present

## 2020-07-31 DIAGNOSIS — R471 Dysarthria and anarthria: Secondary | ICD-10-CM | POA: Diagnosis not present

## 2020-07-31 DIAGNOSIS — R2689 Other abnormalities of gait and mobility: Secondary | ICD-10-CM | POA: Diagnosis not present

## 2020-07-31 DIAGNOSIS — R41841 Cognitive communication deficit: Secondary | ICD-10-CM | POA: Diagnosis not present

## 2020-08-01 DIAGNOSIS — M6281 Muscle weakness (generalized): Secondary | ICD-10-CM | POA: Diagnosis not present

## 2020-08-04 DIAGNOSIS — R2689 Other abnormalities of gait and mobility: Secondary | ICD-10-CM | POA: Diagnosis not present

## 2020-08-04 DIAGNOSIS — M6281 Muscle weakness (generalized): Secondary | ICD-10-CM | POA: Diagnosis not present

## 2020-08-05 DIAGNOSIS — M6281 Muscle weakness (generalized): Secondary | ICD-10-CM | POA: Diagnosis not present

## 2020-08-05 DIAGNOSIS — R2689 Other abnormalities of gait and mobility: Secondary | ICD-10-CM | POA: Diagnosis not present

## 2020-08-06 DIAGNOSIS — R471 Dysarthria and anarthria: Secondary | ICD-10-CM | POA: Diagnosis not present

## 2020-08-06 DIAGNOSIS — R41841 Cognitive communication deficit: Secondary | ICD-10-CM | POA: Diagnosis not present

## 2020-08-06 DIAGNOSIS — M6281 Muscle weakness (generalized): Secondary | ICD-10-CM | POA: Diagnosis not present

## 2020-08-07 DIAGNOSIS — R2689 Other abnormalities of gait and mobility: Secondary | ICD-10-CM | POA: Diagnosis not present

## 2020-08-07 DIAGNOSIS — R41841 Cognitive communication deficit: Secondary | ICD-10-CM | POA: Diagnosis not present

## 2020-08-07 DIAGNOSIS — R471 Dysarthria and anarthria: Secondary | ICD-10-CM | POA: Diagnosis not present

## 2020-08-07 DIAGNOSIS — M6281 Muscle weakness (generalized): Secondary | ICD-10-CM | POA: Diagnosis not present

## 2020-08-08 DIAGNOSIS — M6281 Muscle weakness (generalized): Secondary | ICD-10-CM | POA: Diagnosis not present

## 2020-08-11 DIAGNOSIS — M6281 Muscle weakness (generalized): Secondary | ICD-10-CM | POA: Diagnosis not present

## 2020-08-11 DIAGNOSIS — R2689 Other abnormalities of gait and mobility: Secondary | ICD-10-CM | POA: Diagnosis not present

## 2020-08-13 DIAGNOSIS — R471 Dysarthria and anarthria: Secondary | ICD-10-CM | POA: Diagnosis not present

## 2020-08-13 DIAGNOSIS — R41841 Cognitive communication deficit: Secondary | ICD-10-CM | POA: Diagnosis not present

## 2020-08-13 DIAGNOSIS — R2689 Other abnormalities of gait and mobility: Secondary | ICD-10-CM | POA: Diagnosis not present

## 2020-08-13 DIAGNOSIS — M6281 Muscle weakness (generalized): Secondary | ICD-10-CM | POA: Diagnosis not present

## 2020-08-14 ENCOUNTER — Other Ambulatory Visit: Payer: Self-pay | Admitting: Family Medicine

## 2020-08-14 DIAGNOSIS — R2689 Other abnormalities of gait and mobility: Secondary | ICD-10-CM | POA: Diagnosis not present

## 2020-08-14 DIAGNOSIS — R41841 Cognitive communication deficit: Secondary | ICD-10-CM | POA: Diagnosis not present

## 2020-08-14 DIAGNOSIS — M6281 Muscle weakness (generalized): Secondary | ICD-10-CM | POA: Diagnosis not present

## 2020-08-14 DIAGNOSIS — R471 Dysarthria and anarthria: Secondary | ICD-10-CM | POA: Diagnosis not present

## 2020-08-18 DIAGNOSIS — M6281 Muscle weakness (generalized): Secondary | ICD-10-CM | POA: Diagnosis not present

## 2020-08-18 DIAGNOSIS — R2689 Other abnormalities of gait and mobility: Secondary | ICD-10-CM | POA: Diagnosis not present

## 2020-08-19 DIAGNOSIS — R2689 Other abnormalities of gait and mobility: Secondary | ICD-10-CM | POA: Diagnosis not present

## 2020-08-19 DIAGNOSIS — M6281 Muscle weakness (generalized): Secondary | ICD-10-CM | POA: Diagnosis not present

## 2020-08-19 DIAGNOSIS — R41841 Cognitive communication deficit: Secondary | ICD-10-CM | POA: Diagnosis not present

## 2020-08-19 DIAGNOSIS — R471 Dysarthria and anarthria: Secondary | ICD-10-CM | POA: Diagnosis not present

## 2020-08-20 DIAGNOSIS — M6281 Muscle weakness (generalized): Secondary | ICD-10-CM | POA: Diagnosis not present

## 2020-08-21 ENCOUNTER — Other Ambulatory Visit: Payer: Self-pay | Admitting: Family Medicine

## 2020-08-21 DIAGNOSIS — M6281 Muscle weakness (generalized): Secondary | ICD-10-CM | POA: Diagnosis not present

## 2020-08-21 DIAGNOSIS — R2689 Other abnormalities of gait and mobility: Secondary | ICD-10-CM | POA: Diagnosis not present

## 2020-08-22 DIAGNOSIS — M6281 Muscle weakness (generalized): Secondary | ICD-10-CM | POA: Diagnosis not present

## 2020-08-25 DIAGNOSIS — R41841 Cognitive communication deficit: Secondary | ICD-10-CM | POA: Diagnosis not present

## 2020-08-25 DIAGNOSIS — R471 Dysarthria and anarthria: Secondary | ICD-10-CM | POA: Diagnosis not present

## 2020-08-25 DIAGNOSIS — M6281 Muscle weakness (generalized): Secondary | ICD-10-CM | POA: Diagnosis not present

## 2020-08-25 DIAGNOSIS — R2689 Other abnormalities of gait and mobility: Secondary | ICD-10-CM | POA: Diagnosis not present

## 2020-08-26 DIAGNOSIS — M6281 Muscle weakness (generalized): Secondary | ICD-10-CM | POA: Diagnosis not present

## 2020-08-26 DIAGNOSIS — R2689 Other abnormalities of gait and mobility: Secondary | ICD-10-CM | POA: Diagnosis not present

## 2020-08-27 DIAGNOSIS — M6281 Muscle weakness (generalized): Secondary | ICD-10-CM | POA: Diagnosis not present

## 2020-08-27 DIAGNOSIS — R2689 Other abnormalities of gait and mobility: Secondary | ICD-10-CM | POA: Diagnosis not present

## 2020-08-28 DIAGNOSIS — R2689 Other abnormalities of gait and mobility: Secondary | ICD-10-CM | POA: Diagnosis not present

## 2020-08-28 DIAGNOSIS — M6281 Muscle weakness (generalized): Secondary | ICD-10-CM | POA: Diagnosis not present

## 2020-08-29 DIAGNOSIS — R471 Dysarthria and anarthria: Secondary | ICD-10-CM | POA: Diagnosis not present

## 2020-08-29 DIAGNOSIS — R2689 Other abnormalities of gait and mobility: Secondary | ICD-10-CM | POA: Diagnosis not present

## 2020-08-29 DIAGNOSIS — M6281 Muscle weakness (generalized): Secondary | ICD-10-CM | POA: Diagnosis not present

## 2020-08-29 DIAGNOSIS — R41841 Cognitive communication deficit: Secondary | ICD-10-CM | POA: Diagnosis not present

## 2020-09-01 DIAGNOSIS — M6281 Muscle weakness (generalized): Secondary | ICD-10-CM | POA: Diagnosis not present

## 2020-09-01 DIAGNOSIS — R41841 Cognitive communication deficit: Secondary | ICD-10-CM | POA: Diagnosis not present

## 2020-09-01 DIAGNOSIS — R2689 Other abnormalities of gait and mobility: Secondary | ICD-10-CM | POA: Diagnosis not present

## 2020-09-01 DIAGNOSIS — R471 Dysarthria and anarthria: Secondary | ICD-10-CM | POA: Diagnosis not present

## 2020-09-02 DIAGNOSIS — R471 Dysarthria and anarthria: Secondary | ICD-10-CM | POA: Diagnosis not present

## 2020-09-02 DIAGNOSIS — R41841 Cognitive communication deficit: Secondary | ICD-10-CM | POA: Diagnosis not present

## 2020-09-02 DIAGNOSIS — R2689 Other abnormalities of gait and mobility: Secondary | ICD-10-CM | POA: Diagnosis not present

## 2020-09-02 DIAGNOSIS — M6281 Muscle weakness (generalized): Secondary | ICD-10-CM | POA: Diagnosis not present

## 2020-09-03 DIAGNOSIS — M6281 Muscle weakness (generalized): Secondary | ICD-10-CM | POA: Diagnosis not present

## 2020-09-03 DIAGNOSIS — R2689 Other abnormalities of gait and mobility: Secondary | ICD-10-CM | POA: Diagnosis not present

## 2020-09-04 DIAGNOSIS — R471 Dysarthria and anarthria: Secondary | ICD-10-CM | POA: Diagnosis not present

## 2020-09-04 DIAGNOSIS — R2689 Other abnormalities of gait and mobility: Secondary | ICD-10-CM | POA: Diagnosis not present

## 2020-09-04 DIAGNOSIS — R41841 Cognitive communication deficit: Secondary | ICD-10-CM | POA: Diagnosis not present

## 2020-09-04 DIAGNOSIS — M6281 Muscle weakness (generalized): Secondary | ICD-10-CM | POA: Diagnosis not present

## 2020-09-05 DIAGNOSIS — R2689 Other abnormalities of gait and mobility: Secondary | ICD-10-CM | POA: Diagnosis not present

## 2020-09-05 DIAGNOSIS — M6281 Muscle weakness (generalized): Secondary | ICD-10-CM | POA: Diagnosis not present

## 2020-09-09 DIAGNOSIS — R41841 Cognitive communication deficit: Secondary | ICD-10-CM | POA: Diagnosis not present

## 2020-09-09 DIAGNOSIS — R471 Dysarthria and anarthria: Secondary | ICD-10-CM | POA: Diagnosis not present

## 2020-09-09 DIAGNOSIS — M6281 Muscle weakness (generalized): Secondary | ICD-10-CM | POA: Diagnosis not present

## 2020-09-10 DIAGNOSIS — R471 Dysarthria and anarthria: Secondary | ICD-10-CM | POA: Diagnosis not present

## 2020-09-10 DIAGNOSIS — R2689 Other abnormalities of gait and mobility: Secondary | ICD-10-CM | POA: Diagnosis not present

## 2020-09-10 DIAGNOSIS — R41841 Cognitive communication deficit: Secondary | ICD-10-CM | POA: Diagnosis not present

## 2020-09-10 DIAGNOSIS — M6281 Muscle weakness (generalized): Secondary | ICD-10-CM | POA: Diagnosis not present

## 2020-09-11 DIAGNOSIS — R471 Dysarthria and anarthria: Secondary | ICD-10-CM | POA: Diagnosis not present

## 2020-09-11 DIAGNOSIS — M6281 Muscle weakness (generalized): Secondary | ICD-10-CM | POA: Diagnosis not present

## 2020-09-11 DIAGNOSIS — R41841 Cognitive communication deficit: Secondary | ICD-10-CM | POA: Diagnosis not present

## 2020-09-11 DIAGNOSIS — R2689 Other abnormalities of gait and mobility: Secondary | ICD-10-CM | POA: Diagnosis not present

## 2020-09-12 DIAGNOSIS — R2689 Other abnormalities of gait and mobility: Secondary | ICD-10-CM | POA: Diagnosis not present

## 2020-09-12 DIAGNOSIS — M6281 Muscle weakness (generalized): Secondary | ICD-10-CM | POA: Diagnosis not present

## 2020-09-15 DIAGNOSIS — R2689 Other abnormalities of gait and mobility: Secondary | ICD-10-CM | POA: Diagnosis not present

## 2020-09-15 DIAGNOSIS — M6281 Muscle weakness (generalized): Secondary | ICD-10-CM | POA: Diagnosis not present

## 2020-09-16 DIAGNOSIS — R471 Dysarthria and anarthria: Secondary | ICD-10-CM | POA: Diagnosis not present

## 2020-09-16 DIAGNOSIS — R41841 Cognitive communication deficit: Secondary | ICD-10-CM | POA: Diagnosis not present

## 2020-09-16 DIAGNOSIS — M6281 Muscle weakness (generalized): Secondary | ICD-10-CM | POA: Diagnosis not present

## 2020-09-17 DIAGNOSIS — R471 Dysarthria and anarthria: Secondary | ICD-10-CM | POA: Diagnosis not present

## 2020-09-17 DIAGNOSIS — M6281 Muscle weakness (generalized): Secondary | ICD-10-CM | POA: Diagnosis not present

## 2020-09-17 DIAGNOSIS — R2689 Other abnormalities of gait and mobility: Secondary | ICD-10-CM | POA: Diagnosis not present

## 2020-09-17 DIAGNOSIS — R41841 Cognitive communication deficit: Secondary | ICD-10-CM | POA: Diagnosis not present

## 2020-09-18 DIAGNOSIS — M6281 Muscle weakness (generalized): Secondary | ICD-10-CM | POA: Diagnosis not present

## 2020-09-18 DIAGNOSIS — R471 Dysarthria and anarthria: Secondary | ICD-10-CM | POA: Diagnosis not present

## 2020-09-18 DIAGNOSIS — R2689 Other abnormalities of gait and mobility: Secondary | ICD-10-CM | POA: Diagnosis not present

## 2020-09-18 DIAGNOSIS — R41841 Cognitive communication deficit: Secondary | ICD-10-CM | POA: Diagnosis not present

## 2020-09-22 DIAGNOSIS — M6281 Muscle weakness (generalized): Secondary | ICD-10-CM | POA: Diagnosis not present

## 2020-09-22 DIAGNOSIS — R2689 Other abnormalities of gait and mobility: Secondary | ICD-10-CM | POA: Diagnosis not present

## 2020-09-23 DIAGNOSIS — M6281 Muscle weakness (generalized): Secondary | ICD-10-CM | POA: Diagnosis not present

## 2020-09-24 DIAGNOSIS — M6281 Muscle weakness (generalized): Secondary | ICD-10-CM | POA: Diagnosis not present

## 2020-09-24 DIAGNOSIS — R41841 Cognitive communication deficit: Secondary | ICD-10-CM | POA: Diagnosis not present

## 2020-09-24 DIAGNOSIS — R471 Dysarthria and anarthria: Secondary | ICD-10-CM | POA: Diagnosis not present

## 2020-09-24 DIAGNOSIS — R2689 Other abnormalities of gait and mobility: Secondary | ICD-10-CM | POA: Diagnosis not present

## 2020-09-25 DIAGNOSIS — M6281 Muscle weakness (generalized): Secondary | ICD-10-CM | POA: Diagnosis not present

## 2020-09-25 DIAGNOSIS — R2689 Other abnormalities of gait and mobility: Secondary | ICD-10-CM | POA: Diagnosis not present

## 2020-09-26 DIAGNOSIS — R41841 Cognitive communication deficit: Secondary | ICD-10-CM | POA: Diagnosis not present

## 2020-09-26 DIAGNOSIS — R471 Dysarthria and anarthria: Secondary | ICD-10-CM | POA: Diagnosis not present

## 2020-09-26 DIAGNOSIS — M6281 Muscle weakness (generalized): Secondary | ICD-10-CM | POA: Diagnosis not present

## 2020-09-29 ENCOUNTER — Other Ambulatory Visit: Payer: Self-pay | Admitting: Neurology

## 2020-09-29 DIAGNOSIS — R2689 Other abnormalities of gait and mobility: Secondary | ICD-10-CM | POA: Diagnosis not present

## 2020-09-29 DIAGNOSIS — R41841 Cognitive communication deficit: Secondary | ICD-10-CM | POA: Diagnosis not present

## 2020-09-29 DIAGNOSIS — M6281 Muscle weakness (generalized): Secondary | ICD-10-CM | POA: Diagnosis not present

## 2020-09-29 DIAGNOSIS — R471 Dysarthria and anarthria: Secondary | ICD-10-CM | POA: Diagnosis not present

## 2020-09-30 DIAGNOSIS — R471 Dysarthria and anarthria: Secondary | ICD-10-CM | POA: Diagnosis not present

## 2020-09-30 DIAGNOSIS — R41841 Cognitive communication deficit: Secondary | ICD-10-CM | POA: Diagnosis not present

## 2020-10-01 DIAGNOSIS — M6281 Muscle weakness (generalized): Secondary | ICD-10-CM | POA: Diagnosis not present

## 2020-10-01 DIAGNOSIS — R471 Dysarthria and anarthria: Secondary | ICD-10-CM | POA: Diagnosis not present

## 2020-10-01 DIAGNOSIS — R41841 Cognitive communication deficit: Secondary | ICD-10-CM | POA: Diagnosis not present

## 2020-10-02 DIAGNOSIS — R2689 Other abnormalities of gait and mobility: Secondary | ICD-10-CM | POA: Diagnosis not present

## 2020-10-02 DIAGNOSIS — M6281 Muscle weakness (generalized): Secondary | ICD-10-CM | POA: Diagnosis not present

## 2020-10-03 DIAGNOSIS — R2689 Other abnormalities of gait and mobility: Secondary | ICD-10-CM | POA: Diagnosis not present

## 2020-10-03 DIAGNOSIS — M6281 Muscle weakness (generalized): Secondary | ICD-10-CM | POA: Diagnosis not present

## 2020-10-06 DIAGNOSIS — R471 Dysarthria and anarthria: Secondary | ICD-10-CM | POA: Diagnosis not present

## 2020-10-06 DIAGNOSIS — R2689 Other abnormalities of gait and mobility: Secondary | ICD-10-CM | POA: Diagnosis not present

## 2020-10-06 DIAGNOSIS — R41841 Cognitive communication deficit: Secondary | ICD-10-CM | POA: Diagnosis not present

## 2020-10-06 DIAGNOSIS — M6281 Muscle weakness (generalized): Secondary | ICD-10-CM | POA: Diagnosis not present

## 2020-10-07 DIAGNOSIS — R471 Dysarthria and anarthria: Secondary | ICD-10-CM | POA: Diagnosis not present

## 2020-10-07 DIAGNOSIS — R41841 Cognitive communication deficit: Secondary | ICD-10-CM | POA: Diagnosis not present

## 2020-10-08 DIAGNOSIS — M6281 Muscle weakness (generalized): Secondary | ICD-10-CM | POA: Diagnosis not present

## 2020-10-09 ENCOUNTER — Other Ambulatory Visit: Payer: Self-pay

## 2020-10-09 ENCOUNTER — Encounter: Payer: Self-pay | Admitting: Family Medicine

## 2020-10-09 ENCOUNTER — Other Ambulatory Visit: Payer: Self-pay | Admitting: Family Medicine

## 2020-10-09 ENCOUNTER — Ambulatory Visit (INDEPENDENT_AMBULATORY_CARE_PROVIDER_SITE_OTHER): Payer: Medicare Other | Admitting: Family Medicine

## 2020-10-09 VITALS — BP 140/80 | HR 78 | Temp 99.3°F | Resp 18 | Ht 62.0 in | Wt 128.8 lb

## 2020-10-09 DIAGNOSIS — R350 Frequency of micturition: Secondary | ICD-10-CM | POA: Diagnosis not present

## 2020-10-09 DIAGNOSIS — I10 Essential (primary) hypertension: Secondary | ICD-10-CM

## 2020-10-09 DIAGNOSIS — F419 Anxiety disorder, unspecified: Secondary | ICD-10-CM | POA: Diagnosis not present

## 2020-10-09 DIAGNOSIS — R3129 Other microscopic hematuria: Secondary | ICD-10-CM

## 2020-10-09 DIAGNOSIS — I714 Abdominal aortic aneurysm, without rupture, unspecified: Secondary | ICD-10-CM

## 2020-10-09 DIAGNOSIS — G20 Parkinson's disease: Secondary | ICD-10-CM

## 2020-10-09 DIAGNOSIS — E785 Hyperlipidemia, unspecified: Secondary | ICD-10-CM

## 2020-10-09 DIAGNOSIS — R471 Dysarthria and anarthria: Secondary | ICD-10-CM | POA: Diagnosis not present

## 2020-10-09 DIAGNOSIS — R41841 Cognitive communication deficit: Secondary | ICD-10-CM | POA: Diagnosis not present

## 2020-10-09 DIAGNOSIS — N390 Urinary tract infection, site not specified: Secondary | ICD-10-CM

## 2020-10-09 DIAGNOSIS — M6281 Muscle weakness (generalized): Secondary | ICD-10-CM | POA: Diagnosis not present

## 2020-10-09 LAB — POC URINALSYSI DIPSTICK (AUTOMATED)
Bilirubin, UA: NEGATIVE
Blood, UA: POSITIVE
Glucose, UA: NEGATIVE
Ketones, UA: NEGATIVE
Nitrite, UA: POSITIVE
Protein, UA: NEGATIVE
Spec Grav, UA: 1.025 (ref 1.010–1.025)
Urobilinogen, UA: 0.2 E.U./dL
pH, UA: 5.5 (ref 5.0–8.0)

## 2020-10-09 MED ORDER — SERTRALINE HCL 100 MG PO TABS: 100.0000 mg | ORAL_TABLET | Freq: Every day | ORAL | 1 refills | Status: DC

## 2020-10-09 MED ORDER — LORAZEPAM 0.5 MG PO TABS: 0.2500 mg | ORAL_TABLET | Freq: Three times a day (TID) | ORAL | 0 refills | Status: DC | PRN

## 2020-10-09 MED ORDER — LISINOPRIL 10 MG PO TABS: ORAL_TABLET | ORAL | 1 refills | Status: DC

## 2020-10-09 MED ORDER — POTASSIUM CHLORIDE ER 10 MEQ PO TBCR: 10.0000 meq | EXTENDED_RELEASE_TABLET | Freq: Every day | ORAL | 3 refills | Status: DC

## 2020-10-09 MED ORDER — NITROFURANTOIN MONOHYD MACRO 100 MG PO CAPS: 100.0000 mg | ORAL_CAPSULE | Freq: Two times a day (BID) | ORAL | 0 refills | Status: DC

## 2020-10-09 MED ORDER — CARBIDOPA-LEVODOPA ER 25-100 MG PO TBCR
2.0000 | EXTENDED_RELEASE_TABLET | Freq: Three times a day (TID) | ORAL | 0 refills | Status: DC
Start: 2020-10-09 — End: 2021-01-07

## 2020-10-09 MED ORDER — LOVASTATIN 20 MG PO TABS: 20.0000 mg | ORAL_TABLET | Freq: Every day | ORAL | 0 refills | Status: DC

## 2020-10-09 MED ORDER — FUROSEMIDE 40 MG PO TABS: ORAL_TABLET | ORAL | 1 refills | Status: DC

## 2020-10-09 NOTE — Assessment & Plan Note (Signed)
Well controlled, no changes to meds. Encouraged heart healthy diet such as the DASH diet and exercise as tolerated.  °

## 2020-10-09 NOTE — Progress Notes (Signed)
Subjective:    Patient ID: Elizabeth Mccullough, female    DOB: 03-30-1941, 80 y.o.   MRN: [id number]  Chief Complaint  Patient presents with  . Hyperlipidemia  . Follow-up    HPI Patient is in today for f/u cholesterol   Her caretaker is with her , Hinton Dyer, and is requesting refills of cardiac meds--- cardiology has released her and her daughter does not want to take her to neuro any more--- she needs refill of this too.  She also request we check her urine because she has had frequent uti   Past Medical History:  Diagnosis Date  . AAA (abdominal aortic aneurysm) ([place])   . Carotid artery disease ([place]) 01/12/2017   Carotid US 6/18: Bilat < 50%; repeat in 12/2000  . Chronic combined systolic and diastolic CHF (congestive heart failure) ([place]) 03/01/2017   NICM // [place] 8/18: normal coronary arteries /  Echo 8/18: EF 20-25, mod AI, mod MR, PASP 32 // Echo 1/19: EF 55-60, no RWMA, Gr 1 DD, mod AI, mild MR, PASP 32  . Hyperlipidemia   . Parkinson's disease [facility])     Past Surgical History:  Procedure Laterality Date  . BREAST CYST ASPIRATION  1965   Right Breast  . HAMMER TOE SURGERY  04/10/02   Left Toe  . KNEE SURGERY Right 10/17/15   meniscus repair  . NASAL SEPTUM SURGERY  1980  . RIGHT/LEFT HEART CATH AND CORONARY ANGIOGRAPHY N/A 03/03/2017   Procedure: RIGHT/LEFT HEART CATH AND CORONARY ANGIOGRAPHY;  Surgeon: Nelva Bush, MD;  Location: [place] [facility];  Service: Cardiovascular;  Laterality: N/A;  . THORACIC AORTOGRAM N/A 03/03/2017   Procedure: Thoracic Aortogram;  Surgeon: Nelva Bush, MD;  Location: [place] [facility];  Service: Cardiovascular;  Laterality: N/A;    Family History  Problem Relation Age of Onset  . Heart disease Mother   . Hyperlipidemia Sister   . Hyperlipidemia Brother   . Diabetes Sister   . Hyperlipidemia Sister   . Cancer Sister 74       colon  . Cancer Sister        breast  . Stroke Brother   . Hypertension Brother   . Breast cancer Other    . Colon cancer Other   . Prostate cancer Son   . Diabetes Daughter   . Stomach cancer Daughter   . Healthy Daughter     Social History   Socioeconomic History  . Marital status: Widowed    Spouse name: Not on file  . Number of children: 4  . Years of education: Not on file  . Highest education level: 12th grade  Occupational History  . Occupation: retired  Tobacco Use  . Smoking status: Never Smoker  . Smokeless tobacco: Never Used  Vaping Use  . Vaping Use: Never used  Substance and Sexual Activity  . Alcohol use: Yes    Comment: social wine   . Drug use: No  . Sexual activity: Not Currently    Partners: Male  Other Topics Concern  . Not on file  Social History Narrative   Exercise-- no   Pt lives with daughter [name] at home   Social Determinants of Health   Financial Resource Strain: Low Risk   . Difficulty of Paying Living Expenses: Not hard at all  Food Insecurity: No Food Insecurity  . Worried About Charity fundraiser in the Last Year: Never true  . Ran Out of Food in the Last Year:  Never true  Transportation Needs: No Transportation Needs  . Lack of Transportation (Medical): No  . Lack of Transportation (Non-Medical): No  Physical Activity: Not on file  Stress: Not on file  Social Connections: Not on file  Intimate Partner Violence: Not on file      Allergies  Allergen Reactions  . Iohexol Rash     Code: RASH, Desc: PATIENT STATES SHE IS ALLERGIC TO IV DYE. 20 YRS AGO SHE HAD A REACTION AT [facility], PT WAS GIVEN BENADRYL. 07/13/06/RM, Onset Date: [id number]   . Penicillins Hives    Review of Systems  Constitutional: Negative for chills, fever and malaise/fatigue.  HENT: Negative for congestion and hearing loss.   Eyes: Negative for discharge.  Respiratory: Negative for cough, sputum production and shortness of breath.   Cardiovascular: Negative for chest pain, palpitations and leg swelling.  Gastrointestinal: Negative for abdominal pain,  blood in stool, constipation, diarrhea, heartburn, nausea and vomiting.  Genitourinary: Positive for frequency. Negative for dysuria, hematuria and urgency.  Musculoskeletal: Negative for back pain, falls and myalgias.  Skin: Negative for rash.  Neurological: Negative for dizziness, sensory change, loss of consciousness, weakness and headaches.  Endo/Heme/Allergies: Negative for environmental allergies. Does not bruise/bleed easily.  Psychiatric/Behavioral: Negative for depression and suicidal ideas. The patient is nervous/anxious. The patient does not have insomnia.        Objective:    Physical Exam Vitals and nursing note reviewed.  Constitutional:      Appearance: She is well-developed.  HENT:     Head: Normocephalic and atraumatic.  Eyes:     Conjunctiva/sclera: Conjunctivae normal.  Neck:     Thyroid: No thyromegaly.     Vascular: No carotid bruit or JVD.  Cardiovascular:     Rate and Rhythm: Normal rate and regular rhythm.     Heart sounds: Normal heart sounds. No murmur heard.   Pulmonary:     Effort: Pulmonary effort is normal. No respiratory distress.     Breath sounds: Normal breath sounds. No wheezing or rales.  Chest:     Chest wall: No tenderness.  Musculoskeletal:     Cervical back: Normal range of motion and neck supple.  Neurological:     Mental Status: She is alert and oriented to person, place, and time.  Psychiatric:        Mood and Affect: Mood is anxious. Mood is not depressed. Affect is not tearful or inappropriate.     BP 140/80 (BP Location: Right Arm, Patient Position: Sitting, Cuff Size: Normal)   Pulse 78   Temp 99.3 F (37.4 C) (Oral)   Resp 18   Ht 5\' 2"  (1.575 m)   Wt 128 lb 12.8 oz (58.4 kg)   LMP 07/19/1988   SpO2 97%   BMI 23.56 kg/m  Wt Readings from Last 3 Encounters:  10/09/20 128 lb 12.8 oz (58.4 kg)  05/31/20 126 lb 5.2 oz (57.3 kg)  05/17/20 138 lb 3.7 oz (62.7 kg)    Diabetic Foot Exam - Simple   No data filed     Lab Results  Component Value Date   WBC 5.5 05/28/2020   HGB 10.6 (L) 05/28/2020   HCT 33.0 (L) 05/28/2020   PLT 318 05/28/2020   GLUCOSE 94 05/30/2020   CHOL 166 09/17/2019   TRIG 95.0 09/17/2019   HDL 57.60 09/17/2019   LDLCALC 89 09/17/2019   ALT 12 05/19/2020   AST 41 05/19/2020   NA 139 05/30/2020   K 3.9 05/30/2020  CL 102 05/30/2020   CREATININE 1.30 (H) 05/30/2020   BUN 36 (H) 05/30/2020   CO2 27 05/30/2020   TSH 2.076 05/13/2020   INR 1.1 11/25/2019   HGBA1C 5.4 01/31/2019   MICROALBUR 0.2 01/16/2014    Lab Results  Component Value Date   TSH 2.076 05/13/2020   Lab Results  Component Value Date   WBC 5.5 05/28/2020   HGB 10.6 (L) 05/28/2020   HCT 33.0 (L) 05/28/2020   MCV 99.1 05/28/2020   PLT 318 05/28/2020   Lab Results  Component Value Date   NA 139 05/30/2020   K 3.9 05/30/2020   CO2 27 05/30/2020   GLUCOSE 94 05/30/2020   BUN 36 (H) 05/30/2020   CREATININE 1.30 (H) 05/30/2020   BILITOT 0.6 05/19/2020   ALKPHOS 69 05/19/2020   AST 41 05/19/2020   ALT 12 05/19/2020   PROT 6.1 (L) 05/19/2020   ALBUMIN 3.5 05/19/2020   CALCIUM 9.6 05/30/2020   ANIONGAP 10 05/30/2020   GFR 58.59 (L) 09/17/2019   Lab Results  Component Value Date   CHOL 166 09/17/2019   Lab Results  Component Value Date   HDL 57.60 09/17/2019   Lab Results  Component Value Date   LDLCALC 89 09/17/2019   Lab Results  Component Value Date   TRIG 95.0 09/17/2019   Lab Results  Component Value Date   CHOLHDL 3 09/17/2019   Lab Results  Component Value Date   HGBA1C 5.4 01/31/2019       Assessment & Plan:   Problem List Items Addressed This Visit      Unprioritized   AAA (abdominal aortic aneurysm) ([place])    Per vascular       Relevant Medications   furosemide (LASIX) 40 MG tablet   lisinopril (ZESTRIL) 10 MG tablet   lovastatin (MEVACOR) 20 MG tablet   Anxiety    Inc zoloft to 100 mg daily  F/u 1 month      Relevant Medications   sertraline  (ZOLOFT) 100 MG tablet   LORazepam (ATIVAN) 0.5 MG tablet   Essential hypertension    Well controlled, no changes to meds. Encouraged heart healthy diet such as the DASH diet and exercise as tolerated.       Relevant Medications   furosemide (LASIX) 40 MG tablet   lisinopril (ZESTRIL) 10 MG tablet   lovastatin (MEVACOR) 20 MG tablet   Hyperlipidemia    Tolerating statin, encouraged heart healthy diet, avoid trans fats, minimize simple carbs and saturated fats. Increase exercise as tolerated      Relevant Medications   furosemide (LASIX) 40 MG tablet   lisinopril (ZESTRIL) 10 MG tablet   lovastatin (MEVACOR) 20 MG tablet   Other Relevant Orders   CBC with Differential/Platelet   Lipid panel   Comprehensive metabolic panel   Parkinson's disease ([place])    con't meds Stable Pt family wishes to not see neuro anymore       Relevant Medications   Carbidopa-Levodopa ER (SINEMET CR) 25-100 MG tablet controlled release    Other Visit Diagnoses    Primary hypertension    -  Primary   Relevant Medications   furosemide (LASIX) 40 MG tablet   lisinopril (ZESTRIL) 10 MG tablet   lovastatin (MEVACOR) 20 MG tablet   potassium chloride (KLOR-CON) 10 MEQ tablet   Other Relevant Orders   CBC with Differential/Platelet   Lipid panel   Comprehensive metabolic panel   Urinary frequency  Relevant Orders   POCT Urinalysis Dipstick (Automated) (Completed)      I have changed Elizabeth Mccullough's furosemide, lisinopril, lovastatin, and Carbidopa-Levodopa ER. I am also having her start on sertraline. Additionally, I am having her maintain her aspirin EC, Prolensa, moxifloxacin, acetaminophen, bisoprolol, docusate sodium, HYDROcodone-acetaminophen, melatonin, sertraline, potassium chloride, and LORazepam.  Meds ordered this encounter  Medications  . furosemide (LASIX) 40 MG tablet    Sig: TAKE 1 TABLET BY MOUTH DAILY.    Dispense:  90 tablet    Refill:  1  . lisinopril (ZESTRIL) 10  MG tablet    Sig: TAKE 1 TABLET BY MOUTH DAILY.    Dispense:  90 tablet    Refill:  1  . lovastatin (MEVACOR) 20 MG tablet    Sig: Take 1 tablet (20 mg total) by mouth at bedtime.    Dispense:  90 tablet    Refill:  0  . potassium chloride (KLOR-CON) 10 MEQ tablet    Sig: Take 1 tablet (10 mEq total) by mouth daily.    Dispense:  90 tablet    Refill:  3  . sertraline (ZOLOFT) 100 MG tablet    Sig: Take 1 tablet (100 mg total) by mouth daily.    Dispense:  90 tablet    Refill:  1  . LORazepam (ATIVAN) 0.5 MG tablet    Sig: Take 0.5 tablets (0.25 mg total) by mouth every 8 (eight) hours as needed for anxiety.    Dispense:  20 tablet    Refill:  0  . Carbidopa-Levodopa ER (SINEMET CR) 25-100 MG tablet controlled release    Sig: Take 2 tablets by mouth 3 (three) times daily.    Dispense:  540 tablet    Refill:  0    Pt needs appt for further refills     Ann Held, DO

## 2020-10-09 NOTE — Assessment & Plan Note (Signed)
Tolerating statin, encouraged heart healthy diet, avoid trans fats, minimize simple carbs and saturated fats. Increase exercise as tolerated 

## 2020-10-09 NOTE — Patient Instructions (Signed)

## 2020-10-09 NOTE — Assessment & Plan Note (Signed)
Inc zoloft to 100 mg daily  F/u 1 month

## 2020-10-09 NOTE — Assessment & Plan Note (Signed)
con't meds Stable Pt family wishes to not see neuro anymore

## 2020-10-09 NOTE — Assessment & Plan Note (Signed)
Per vascular  

## 2020-10-10 DIAGNOSIS — R3129 Other microscopic hematuria: Secondary | ICD-10-CM | POA: Diagnosis not present

## 2020-10-10 DIAGNOSIS — R350 Frequency of micturition: Secondary | ICD-10-CM | POA: Diagnosis not present

## 2020-10-10 DIAGNOSIS — M6281 Muscle weakness (generalized): Secondary | ICD-10-CM | POA: Diagnosis not present

## 2020-10-10 LAB — CBC WITH DIFFERENTIAL/PLATELET
Basophils Absolute: 0.1 10*3/uL (ref 0.0–0.1)
Basophils Relative: 1.3 % (ref 0.0–3.0)
Eosinophils Absolute: 0.1 10*3/uL (ref 0.0–0.7)
Eosinophils Relative: 1.7 % (ref 0.0–5.0)
HCT: 35.6 % — ABNORMAL LOW (ref 36.0–46.0)
Hemoglobin: 11.9 g/dL — ABNORMAL LOW (ref 12.0–15.0)
Lymphocytes Relative: 24.7 % (ref 12.0–46.0)
Lymphs Abs: 1.7 10*3/uL (ref 0.7–4.0)
MCHC: 33.5 g/dL (ref 30.0–36.0)
MCV: 93.9 fl (ref 78.0–100.0)
Monocytes Absolute: 0.6 10*3/uL (ref 0.1–1.0)
Monocytes Relative: 8.3 % (ref 3.0–12.0)
Neutro Abs: 4.3 10*3/uL (ref 1.4–7.7)
Neutrophils Relative %: 64 % (ref 43.0–77.0)
Platelets: 311 10*3/uL (ref 150.0–400.0)
RBC: 3.79 Mil/uL — ABNORMAL LOW (ref 3.87–5.11)
RDW: 13.9 % (ref 11.5–15.5)
WBC: 6.7 10*3/uL (ref 4.0–10.5)

## 2020-10-10 LAB — COMPREHENSIVE METABOLIC PANEL
ALT: 6 U/L (ref 0–35)
AST: 10 U/L (ref 0–37)
Albumin: 4.5 g/dL (ref 3.5–5.2)
Alkaline Phosphatase: 75 U/L (ref 39–117)
BUN: 34 mg/dL — ABNORMAL HIGH (ref 6–23)
CO2: 31 mEq/L (ref 19–32)
Calcium: 9.2 mg/dL (ref 8.4–10.5)
Chloride: 102 mEq/L (ref 96–112)
Creatinine, Ser: 1.31 mg/dL — ABNORMAL HIGH (ref 0.40–1.20)
GFR: 38.06 mL/min — ABNORMAL LOW (ref >60.00)
Glucose, Bld: 106 mg/dL — ABNORMAL HIGH (ref 70–99)
Potassium: 4.3 mEq/L (ref 3.5–5.1)
Sodium: 142 mEq/L (ref 135–145)
Total Bilirubin: 0.4 mg/dL (ref 0.2–1.2)
Total Protein: 7.1 g/dL (ref 6.0–8.3)

## 2020-10-10 LAB — LIPID PANEL
Cholesterol: 197 mg/dL (ref 0–200)
HDL: 56.6 mg/dL (ref >39.00)
LDL Cholesterol: 119 mg/dL — ABNORMAL HIGH (ref 0–99)
NonHDL: 140.23
Total CHOL/HDL Ratio: 3
Triglycerides: 108 mg/dL (ref 0.0–149.0)
VLDL: 21.6 mg/dL (ref 0.0–40.0)

## 2020-10-10 NOTE — Addendum Note (Signed)
Addended by: Kelle Darting A on: 10/10/2020 07:08 AM   Modules accepted: Orders

## 2020-10-12 LAB — URINE CULTURE
MICRO NUMBER:: 11693245
SPECIMEN QUALITY:: ADEQUATE

## 2020-10-13 ENCOUNTER — Other Ambulatory Visit: Payer: Self-pay

## 2020-10-13 DIAGNOSIS — R41841 Cognitive communication deficit: Secondary | ICD-10-CM | POA: Diagnosis not present

## 2020-10-13 DIAGNOSIS — R2689 Other abnormalities of gait and mobility: Secondary | ICD-10-CM | POA: Diagnosis not present

## 2020-10-13 DIAGNOSIS — M6281 Muscle weakness (generalized): Secondary | ICD-10-CM | POA: Diagnosis not present

## 2020-10-13 DIAGNOSIS — N390 Urinary tract infection, site not specified: Secondary | ICD-10-CM

## 2020-10-13 DIAGNOSIS — R471 Dysarthria and anarthria: Secondary | ICD-10-CM | POA: Diagnosis not present

## 2020-10-13 MED ORDER — CIPROFLOXACIN HCL 250 MG PO TABS: 250.0000 mg | ORAL_TABLET | Freq: Two times a day (BID) | ORAL | 0 refills | Status: DC

## 2020-10-16 DIAGNOSIS — R2689 Other abnormalities of gait and mobility: Secondary | ICD-10-CM | POA: Diagnosis not present

## 2020-10-16 DIAGNOSIS — R471 Dysarthria and anarthria: Secondary | ICD-10-CM | POA: Diagnosis not present

## 2020-10-16 DIAGNOSIS — M6281 Muscle weakness (generalized): Secondary | ICD-10-CM | POA: Diagnosis not present

## 2020-10-16 DIAGNOSIS — R41841 Cognitive communication deficit: Secondary | ICD-10-CM | POA: Diagnosis not present

## 2020-10-17 DIAGNOSIS — M6281 Muscle weakness (generalized): Secondary | ICD-10-CM | POA: Diagnosis not present

## 2020-10-20 DIAGNOSIS — R2689 Other abnormalities of gait and mobility: Secondary | ICD-10-CM | POA: Diagnosis not present

## 2020-10-20 DIAGNOSIS — R41841 Cognitive communication deficit: Secondary | ICD-10-CM | POA: Diagnosis not present

## 2020-10-20 DIAGNOSIS — R471 Dysarthria and anarthria: Secondary | ICD-10-CM | POA: Diagnosis not present

## 2020-10-20 DIAGNOSIS — M6281 Muscle weakness (generalized): Secondary | ICD-10-CM | POA: Diagnosis not present

## 2020-10-21 DIAGNOSIS — R2689 Other abnormalities of gait and mobility: Secondary | ICD-10-CM | POA: Diagnosis not present

## 2020-10-21 DIAGNOSIS — M6281 Muscle weakness (generalized): Secondary | ICD-10-CM | POA: Diagnosis not present

## 2020-10-22 DIAGNOSIS — R2689 Other abnormalities of gait and mobility: Secondary | ICD-10-CM | POA: Diagnosis not present

## 2020-10-22 DIAGNOSIS — M6281 Muscle weakness (generalized): Secondary | ICD-10-CM | POA: Diagnosis not present

## 2020-11-03 DIAGNOSIS — M6281 Muscle weakness (generalized): Secondary | ICD-10-CM | POA: Diagnosis not present

## 2020-11-04 DIAGNOSIS — M6281 Muscle weakness (generalized): Secondary | ICD-10-CM | POA: Diagnosis not present

## 2020-11-05 DIAGNOSIS — M6281 Muscle weakness (generalized): Secondary | ICD-10-CM | POA: Diagnosis not present

## 2020-11-06 DIAGNOSIS — M6281 Muscle weakness (generalized): Secondary | ICD-10-CM | POA: Diagnosis not present

## 2020-11-07 DIAGNOSIS — M6281 Muscle weakness (generalized): Secondary | ICD-10-CM | POA: Diagnosis not present

## 2020-11-10 DIAGNOSIS — M6281 Muscle weakness (generalized): Secondary | ICD-10-CM | POA: Diagnosis not present

## 2020-11-10 DIAGNOSIS — Z9181 History of falling: Secondary | ICD-10-CM | POA: Diagnosis not present

## 2020-11-10 DIAGNOSIS — R2689 Other abnormalities of gait and mobility: Secondary | ICD-10-CM | POA: Diagnosis not present

## 2020-11-11 DIAGNOSIS — R2689 Other abnormalities of gait and mobility: Secondary | ICD-10-CM | POA: Diagnosis not present

## 2020-11-11 DIAGNOSIS — M6281 Muscle weakness (generalized): Secondary | ICD-10-CM | POA: Diagnosis not present

## 2020-11-11 DIAGNOSIS — Z9181 History of falling: Secondary | ICD-10-CM | POA: Diagnosis not present

## 2020-11-12 DIAGNOSIS — M6281 Muscle weakness (generalized): Secondary | ICD-10-CM | POA: Diagnosis not present

## 2020-11-13 DIAGNOSIS — M6281 Muscle weakness (generalized): Secondary | ICD-10-CM | POA: Diagnosis not present

## 2020-11-14 DIAGNOSIS — M6281 Muscle weakness (generalized): Secondary | ICD-10-CM | POA: Diagnosis not present

## 2020-11-17 DIAGNOSIS — M6281 Muscle weakness (generalized): Secondary | ICD-10-CM | POA: Diagnosis not present

## 2020-11-17 DIAGNOSIS — Z9181 History of falling: Secondary | ICD-10-CM | POA: Diagnosis not present

## 2020-11-17 DIAGNOSIS — R2689 Other abnormalities of gait and mobility: Secondary | ICD-10-CM | POA: Diagnosis not present

## 2020-11-18 DIAGNOSIS — M6281 Muscle weakness (generalized): Secondary | ICD-10-CM | POA: Diagnosis not present

## 2020-11-19 DIAGNOSIS — Z9181 History of falling: Secondary | ICD-10-CM | POA: Diagnosis not present

## 2020-11-19 DIAGNOSIS — R2689 Other abnormalities of gait and mobility: Secondary | ICD-10-CM | POA: Diagnosis not present

## 2020-11-19 DIAGNOSIS — M6281 Muscle weakness (generalized): Secondary | ICD-10-CM | POA: Diagnosis not present

## 2020-11-20 DIAGNOSIS — M6281 Muscle weakness (generalized): Secondary | ICD-10-CM | POA: Diagnosis not present

## 2020-11-21 DIAGNOSIS — M6281 Muscle weakness (generalized): Secondary | ICD-10-CM | POA: Diagnosis not present

## 2020-11-24 DIAGNOSIS — M6281 Muscle weakness (generalized): Secondary | ICD-10-CM | POA: Diagnosis not present

## 2020-11-25 DIAGNOSIS — Z9181 History of falling: Secondary | ICD-10-CM | POA: Diagnosis not present

## 2020-11-25 DIAGNOSIS — R2689 Other abnormalities of gait and mobility: Secondary | ICD-10-CM | POA: Diagnosis not present

## 2020-11-25 DIAGNOSIS — M6281 Muscle weakness (generalized): Secondary | ICD-10-CM | POA: Diagnosis not present

## 2020-11-27 DIAGNOSIS — M6281 Muscle weakness (generalized): Secondary | ICD-10-CM | POA: Diagnosis not present

## 2020-11-28 DIAGNOSIS — M6281 Muscle weakness (generalized): Secondary | ICD-10-CM | POA: Diagnosis not present

## 2020-12-01 DIAGNOSIS — M6281 Muscle weakness (generalized): Secondary | ICD-10-CM | POA: Diagnosis not present

## 2020-12-01 DIAGNOSIS — R2689 Other abnormalities of gait and mobility: Secondary | ICD-10-CM | POA: Diagnosis not present

## 2020-12-01 DIAGNOSIS — Z9181 History of falling: Secondary | ICD-10-CM | POA: Diagnosis not present

## 2020-12-02 DIAGNOSIS — M6281 Muscle weakness (generalized): Secondary | ICD-10-CM | POA: Diagnosis not present

## 2020-12-02 DIAGNOSIS — Z9181 History of falling: Secondary | ICD-10-CM | POA: Diagnosis not present

## 2020-12-02 DIAGNOSIS — R2689 Other abnormalities of gait and mobility: Secondary | ICD-10-CM | POA: Diagnosis not present

## 2020-12-04 DIAGNOSIS — Z9181 History of falling: Secondary | ICD-10-CM | POA: Diagnosis not present

## 2020-12-04 DIAGNOSIS — M6281 Muscle weakness (generalized): Secondary | ICD-10-CM | POA: Diagnosis not present

## 2020-12-04 DIAGNOSIS — R2689 Other abnormalities of gait and mobility: Secondary | ICD-10-CM | POA: Diagnosis not present

## 2020-12-05 DIAGNOSIS — Z9181 History of falling: Secondary | ICD-10-CM | POA: Diagnosis not present

## 2020-12-05 DIAGNOSIS — R2689 Other abnormalities of gait and mobility: Secondary | ICD-10-CM | POA: Diagnosis not present

## 2020-12-05 DIAGNOSIS — M6281 Muscle weakness (generalized): Secondary | ICD-10-CM | POA: Diagnosis not present

## 2020-12-08 DIAGNOSIS — Z9181 History of falling: Secondary | ICD-10-CM | POA: Diagnosis not present

## 2020-12-08 DIAGNOSIS — M6281 Muscle weakness (generalized): Secondary | ICD-10-CM | POA: Diagnosis not present

## 2020-12-08 DIAGNOSIS — R2689 Other abnormalities of gait and mobility: Secondary | ICD-10-CM | POA: Diagnosis not present

## 2020-12-09 DIAGNOSIS — M6281 Muscle weakness (generalized): Secondary | ICD-10-CM | POA: Diagnosis not present

## 2020-12-10 DIAGNOSIS — M6281 Muscle weakness (generalized): Secondary | ICD-10-CM | POA: Diagnosis not present

## 2020-12-11 ENCOUNTER — Other Ambulatory Visit: Payer: Self-pay

## 2020-12-11 ENCOUNTER — Encounter: Payer: Self-pay | Admitting: Family Medicine

## 2020-12-11 ENCOUNTER — Ambulatory Visit (INDEPENDENT_AMBULATORY_CARE_PROVIDER_SITE_OTHER): Payer: Medicare Other | Admitting: Family Medicine

## 2020-12-11 ENCOUNTER — Other Ambulatory Visit (HOSPITAL_BASED_OUTPATIENT_CLINIC_OR_DEPARTMENT_OTHER): Payer: Self-pay

## 2020-12-11 VITALS — BP 120/60 | HR 65 | Temp 98.2°F | Resp 18 | Ht 62.0 in | Wt 132.2 lb

## 2020-12-11 DIAGNOSIS — M6281 Muscle weakness (generalized): Secondary | ICD-10-CM | POA: Diagnosis not present

## 2020-12-11 DIAGNOSIS — R300 Dysuria: Secondary | ICD-10-CM | POA: Diagnosis not present

## 2020-12-11 LAB — POC URINALSYSI DIPSTICK (AUTOMATED)
Bilirubin, UA: NEGATIVE
Blood, UA: NEGATIVE
Glucose, UA: NEGATIVE
Ketones, UA: NEGATIVE
Nitrite, UA: NEGATIVE
Protein, UA: NEGATIVE
Spec Grav, UA: 1.01 (ref 1.010–1.025)
Urobilinogen, UA: 0.2 E.U./dL
pH, UA: 6 (ref 5.0–8.0)

## 2020-12-11 MED ORDER — SULFAMETHOXAZOLE-TRIMETHOPRIM 800-160 MG PO TABS
1.0000 | ORAL_TABLET | Freq: Two times a day (BID) | ORAL | 0 refills | Status: DC
  Filled 2020-12-11: qty 14, 7d supply, fill #0

## 2020-12-11 NOTE — Progress Notes (Signed)
Subjective:   By signing my name below, I, [name], attest that this documentation has been prepared under the direction and in the presence of Dr. Roma Schanz, DO. 12/11/2020      Patient ID: Elizabeth Mccullough, female    DOB: 1941/06/29, 80 y.o.   MRN: [id number]  Chief Complaint  Patient presents with  . Dysuria    Pt states having burning and confusion.     HPI Patient is in today for a office visit. She has a history of parkinson's disease which limits communication during this visit. She has a helper her for the visit to assist in communication.  She complains of a UTI followed by burning, frequency, strong urine odor, bright yellow color, and confusion. She also complains of having occasional falls recently. She can sometimes get up on her own but requires assistance to get up. She mentions that she is fearful of falling as there are times no one is there to help her and she cannot get up for hours. She had recently left a nursing home to live in a facility by herself. She has her helpers for 5 hours of the day but requests to have them stay longer to assist her and help prevent her from falling. She gets increased anxiety when they have to leave for the day. She drinks plenty of water throughout the day.  She also complains of a headaches everyday after eating. She takes ibuprofen to manage her pain and finds relief occasionally.  She has an alarm that notifies her when to take her medications.    Past Medical History:  Diagnosis Date  . AAA (abdominal aortic aneurysm) ([place])   . Carotid artery disease ([place]) 01/12/2017   Carotid US 6/18: Bilat < 50%; repeat in 12/2000  . Chronic combined systolic and diastolic CHF (congestive heart failure) ([place]) 03/01/2017   NICM // [place] 8/18: normal coronary arteries /  Echo 8/18: EF 20-25, mod AI, mod MR, PASP 32 // Echo 1/19: EF 55-60, no RWMA, Gr 1 DD, mod AI, mild MR, PASP 32  . Hyperlipidemia   . Parkinson's disease [facility])      Past Surgical History:  Procedure Laterality Date  . BREAST CYST ASPIRATION  1965   Right Breast  . HAMMER TOE SURGERY  04/10/02   Left Toe  . KNEE SURGERY Right 10/17/15   meniscus repair  . NASAL SEPTUM SURGERY  1980  . RIGHT/LEFT HEART CATH AND CORONARY ANGIOGRAPHY N/A 03/03/2017   Procedure: RIGHT/LEFT HEART CATH AND CORONARY ANGIOGRAPHY;  Surgeon: Nelva Bush, MD;  Location: [place] [facility];  Service: Cardiovascular;  Laterality: N/A;  . THORACIC AORTOGRAM N/A 03/03/2017   Procedure: Thoracic Aortogram;  Surgeon: Nelva Bush, MD;  Location: [place] [facility];  Service: Cardiovascular;  Laterality: N/A;    Family History  Problem Relation Age of Onset  . Heart disease Mother   . Hyperlipidemia Sister   . Hyperlipidemia Brother   . Diabetes Sister   . Hyperlipidemia Sister   . Cancer Sister 69       colon  . Cancer Sister        breast  . Stroke Brother   . Hypertension Brother   . Breast cancer Other   . Colon cancer Other   . Prostate cancer Son   . Diabetes Daughter   . Stomach cancer Daughter   . Healthy Daughter     Social History   Socioeconomic History  . Marital status: Widowed  Spouse name: Not on file  . Number of children: 4  . Years of education: Not on file  . Highest education level: 12th grade  Occupational History  . Occupation: retired  Tobacco Use  . Smoking status: Never Smoker  . Smokeless tobacco: Never Used  Vaping Use  . Vaping Use: Never used  Substance and Sexual Activity  . Alcohol use: Yes    Comment: social wine   . Drug use: No  . Sexual activity: Not Currently    Partners: Male  Other Topics Concern  . Not on file  Social History Narrative   Exercise-- no   Pt lives with daughter [name] at home   Social Determinants of Health   Financial Resource Strain: Low Risk   . Difficulty of Paying Living Expenses: Not hard at all  Food Insecurity: No Food Insecurity  . Worried About Charity fundraiser in the  Last Year: Never true  . Ran Out of Food in the Last Year: Never true  Transportation Needs: No Transportation Needs  . Lack of Transportation (Medical): No  . Lack of Transportation (Non-Medical): No  Physical Activity: Not on file  Stress: Not on file  Social Connections: Not on file  Intimate Partner Violence: Not on file      Allergies  Allergen Reactions  . Iohexol Rash     Code: RASH, Desc: PATIENT STATES SHE IS ALLERGIC TO IV DYE. 20 YRS AGO SHE HAD A REACTION AT [facility], PT WAS GIVEN BENADRYL. 07/13/06/RM, Onset Date: [id number]   . Penicillins Hives    Review of Systems  Genitourinary: Positive for dysuria and frequency.       (+)Strong urine order (+)Bright yellow urine  Neurological:       (+)increased confusion  Psychiatric/Behavioral: The patient is nervous/anxious.        Objective:    Physical Exam Constitutional:      General: She is not in acute distress.    Appearance: Normal appearance. She is not ill-appearing.  HENT:     Head: Normocephalic and atraumatic.     Right Ear: External ear normal.     Left Ear: External ear normal.  Eyes:     Extraocular Movements: Extraocular movements intact.     Pupils: Pupils are equal, round, and reactive to light.  Cardiovascular:     Rate and Rhythm: Normal rate and regular rhythm.     Pulses: Normal pulses.     Heart sounds: Normal heart sounds. No murmur heard. No gallop.   Pulmonary:     Effort: Pulmonary effort is normal. No respiratory distress.     Breath sounds: Normal breath sounds. No wheezing, rhonchi or rales.  Skin:    General: Skin is warm and dry.  Neurological:     Mental Status: She is alert and oriented to person, place, and time.  Psychiatric:        Behavior: Behavior normal.     BP 120/60 (BP Location: Right Arm, Patient Position: Sitting, Cuff Size: Normal)   Pulse 65   Temp 98.2 F (36.8 C) (Oral)   Resp 18   Ht 5\' 2"  (1.575 m)   Wt 132 lb 3.2 oz (60 kg)   LMP  07/19/1988   SpO2 95%   BMI 24.18 kg/m  Wt Readings from Last 3 Encounters:  12/11/20 132 lb 3.2 oz (60 kg)  10/09/20 128 lb 12.8 oz (58.4 kg)  05/31/20 126 lb 5.2 oz (57.3 kg)    Diabetic  Foot Exam - Simple   No data filed    Lab Results  Component Value Date   WBC 6.7 10/09/2020   HGB 11.9 (L) 10/09/2020   HCT 35.6 (L) 10/09/2020   PLT 311.0 10/09/2020   GLUCOSE 106 (H) 10/09/2020   CHOL 197 10/09/2020   TRIG 108.0 10/09/2020   HDL 56.60 10/09/2020   LDLCALC 119 (H) 10/09/2020   ALT 6 10/09/2020   AST 10 10/09/2020   NA 142 10/09/2020   K 4.3 10/09/2020   CL 102 10/09/2020   CREATININE 1.31 (H) 10/09/2020   BUN 34 (H) 10/09/2020   CO2 31 10/09/2020   TSH 2.076 05/13/2020   INR 1.1 11/25/2019   HGBA1C 5.4 01/31/2019   MICROALBUR 0.2 01/16/2014    Lab Results  Component Value Date   TSH 2.076 05/13/2020   Lab Results  Component Value Date   WBC 6.7 10/09/2020   HGB 11.9 (L) 10/09/2020   HCT 35.6 (L) 10/09/2020   MCV 93.9 10/09/2020   PLT 311.0 10/09/2020   Lab Results  Component Value Date   NA 142 10/09/2020   K 4.3 10/09/2020   CO2 31 10/09/2020   GLUCOSE 106 (H) 10/09/2020   BUN 34 (H) 10/09/2020   CREATININE 1.31 (H) 10/09/2020   BILITOT 0.4 10/09/2020   ALKPHOS 75 10/09/2020   AST 10 10/09/2020   ALT 6 10/09/2020   PROT 7.1 10/09/2020   ALBUMIN 4.5 10/09/2020   CALCIUM 9.2 10/09/2020   ANIONGAP 10 05/30/2020   GFR 38.06 (L) 10/09/2020   Lab Results  Component Value Date   CHOL 197 10/09/2020   Lab Results  Component Value Date   HDL 56.60 10/09/2020   Lab Results  Component Value Date   LDLCALC 119 (H) 10/09/2020   Lab Results  Component Value Date   TRIG 108.0 10/09/2020   Lab Results  Component Value Date   CHOLHDL 3 10/09/2020   Lab Results  Component Value Date   HGBA1C 5.4 01/31/2019       Assessment & Plan:   Problem List Items Addressed This Visit   None   Visit Diagnoses    Dysuria    -  Primary    Relevant Medications   sulfamethoxazole-trimethoprim (BACTRIM DS) 800-160 MG tablet   Other Relevant Orders   POCT Urinalysis Dipstick (Automated) (Completed)   Urine Culture       Meds ordered this encounter  Medications  . sulfamethoxazole-trimethoprim (BACTRIM DS) 800-160 MG tablet    Sig: Take 1 tablet by mouth 2 (two) times daily.    Dispense:  14 tablet    Refill:  0    I, Dr. Roma Schanz, DO. 12/11/2020, personally preformed the services described in this documentation.  All medical record entries made by the scribe were at my direction and in my presence.  I have reviewed the chart and discharge instructions (if applicable) and agree that the record reflects my personal performance and is accurate and complete. 12/11/2020   I,[name],acting as a scribe for Ann Held, DO.,have documented all relevant documentation on the behalf of Ann Held, DO,as directed by  Ann Held, DO while in the presence of Ann Held, DO.   Ann Held, DO

## 2020-12-11 NOTE — Patient Instructions (Signed)

## 2020-12-12 DIAGNOSIS — M6281 Muscle weakness (generalized): Secondary | ICD-10-CM | POA: Diagnosis not present

## 2020-12-13 LAB — URINE CULTURE
MICRO NUMBER:: 11938468
SPECIMEN QUALITY:: ADEQUATE

## 2020-12-15 DIAGNOSIS — R2689 Other abnormalities of gait and mobility: Secondary | ICD-10-CM | POA: Diagnosis not present

## 2020-12-15 DIAGNOSIS — Z9181 History of falling: Secondary | ICD-10-CM | POA: Diagnosis not present

## 2020-12-15 DIAGNOSIS — M6281 Muscle weakness (generalized): Secondary | ICD-10-CM | POA: Diagnosis not present

## 2020-12-16 ENCOUNTER — Encounter (HOSPITAL_BASED_OUTPATIENT_CLINIC_OR_DEPARTMENT_OTHER): Payer: Self-pay | Admitting: *Deleted

## 2020-12-16 ENCOUNTER — Emergency Department (HOSPITAL_BASED_OUTPATIENT_CLINIC_OR_DEPARTMENT_OTHER): Payer: Medicare Other

## 2020-12-16 ENCOUNTER — Emergency Department (HOSPITAL_BASED_OUTPATIENT_CLINIC_OR_DEPARTMENT_OTHER)
Admission: EM | Admit: 2020-12-16 | Discharge: 2020-12-16 | Disposition: A | Discharge status: Home / Self Care | Payer: Medicare Other | Source: Home / Self Care

## 2020-12-16 ENCOUNTER — Emergency Department (HOSPITAL_COMMUNITY)
Admission: EM | Admit: 2020-12-16 | Discharge: 2020-12-16 | Disposition: A | Discharge status: Home / Self Care | Payer: Medicare Other | Attending: Physician Assistant | Admitting: Physician Assistant

## 2020-12-16 ENCOUNTER — Emergency Department (HOSPITAL_COMMUNITY): Payer: Medicare Other

## 2020-12-16 ENCOUNTER — Encounter (HOSPITAL_COMMUNITY): Payer: Self-pay | Admitting: *Deleted

## 2020-12-16 ENCOUNTER — Other Ambulatory Visit: Payer: Self-pay

## 2020-12-16 DIAGNOSIS — R519 Headache, unspecified: Secondary | ICD-10-CM | POA: Insufficient documentation

## 2020-12-16 DIAGNOSIS — M25561 Pain in right knee: Secondary | ICD-10-CM | POA: Insufficient documentation

## 2020-12-16 DIAGNOSIS — Z5321 Procedure and treatment not carried out due to patient leaving prior to being seen by health care provider: Secondary | ICD-10-CM | POA: Insufficient documentation

## 2020-12-16 DIAGNOSIS — M25562 Pain in left knee: Secondary | ICD-10-CM | POA: Insufficient documentation

## 2020-12-16 DIAGNOSIS — M25531 Pain in right wrist: Secondary | ICD-10-CM | POA: Insufficient documentation

## 2020-12-16 DIAGNOSIS — W19XXXA Unspecified fall, initial encounter: Secondary | ICD-10-CM | POA: Insufficient documentation

## 2020-12-16 DIAGNOSIS — Z9181 History of falling: Secondary | ICD-10-CM | POA: Diagnosis not present

## 2020-12-16 DIAGNOSIS — M6281 Muscle weakness (generalized): Secondary | ICD-10-CM | POA: Diagnosis not present

## 2020-12-16 DIAGNOSIS — R2689 Other abnormalities of gait and mobility: Secondary | ICD-10-CM | POA: Diagnosis not present

## 2020-12-16 DIAGNOSIS — R410 Disorientation, unspecified: Secondary | ICD-10-CM | POA: Insufficient documentation

## 2020-12-16 DIAGNOSIS — I959 Hypotension, unspecified: Secondary | ICD-10-CM | POA: Diagnosis not present

## 2020-12-16 DIAGNOSIS — R0902 Hypoxemia: Secondary | ICD-10-CM | POA: Diagnosis not present

## 2020-12-16 NOTE — ED Provider Notes (Signed)
Emergency Medicine Provider Triage Evaluation Note  Elizabeth Mccullough , a 80 y.o. female  was evaluated in triage.  Pt complains of headache after fall.  EMS states that she fell 3 times today.  Denies any neck pain, back pain, numbness, vision changes or anticoagulant use.  Review of Systems  Positive: Headache Negative: Back pain, neck pain, numbness  Physical Exam  Temp 98.2 F (36.8 C) (Oral)   LMP 07/19/1988  Gen:   Awake, no distress   Resp:  Normal effort  MSK:   Moves extremities without difficulty  Other:  No facial asymmetry noted.  No tenderness palpation of the cervical spine at the midline  Medical Decision Making  Medically screening exam initiated at 6:43 PM.  Appropriate orders placed.  JARYN HOCUTT was informed that the remainder of the evaluation will be completed by another provider, this initial triage assessment does not replace that evaluation, and the importance of remaining in the ED until their evaluation is complete.  Imaging ordered   Delia Heady, PA-C 12/16/20 1844    Wyvonnia Dusky, MD 12/17/20 [id number]

## 2020-12-16 NOTE — ED Triage Notes (Signed)
She lives at [facility] She has fallen x 3 today. Pain to her right wrist, both knees and right side of her head. She does not take blood thinners. She is alert but confused per normal. No obvious injuries noted.

## 2020-12-16 NOTE — ED Triage Notes (Signed)
Per EMS, pt from [place] has fallen 3 times today. She is not taking blood thinners. No obvious injuries. She started taking Bactrim on 5/26.   BP 93/53 HR 74 Spo2 94 CBG 92

## 2020-12-17 DIAGNOSIS — M6281 Muscle weakness (generalized): Secondary | ICD-10-CM | POA: Diagnosis not present

## 2020-12-18 DIAGNOSIS — M6281 Muscle weakness (generalized): Secondary | ICD-10-CM | POA: Diagnosis not present

## 2020-12-18 DIAGNOSIS — Z9181 History of falling: Secondary | ICD-10-CM | POA: Diagnosis not present

## 2020-12-18 DIAGNOSIS — R2689 Other abnormalities of gait and mobility: Secondary | ICD-10-CM | POA: Diagnosis not present

## 2020-12-19 ENCOUNTER — Other Ambulatory Visit: Payer: Self-pay

## 2020-12-19 ENCOUNTER — Ambulatory Visit (HOSPITAL_BASED_OUTPATIENT_CLINIC_OR_DEPARTMENT_OTHER)
Admission: RE | Admit: 2020-12-19 | Discharge: 2020-12-19 | Disposition: A | Discharge status: Home / Self Care | Payer: Medicare Other | Source: Ambulatory Visit | Attending: Family Medicine | Admitting: Family Medicine

## 2020-12-19 ENCOUNTER — Encounter: Payer: Self-pay | Admitting: Family Medicine

## 2020-12-19 ENCOUNTER — Ambulatory Visit (INDEPENDENT_AMBULATORY_CARE_PROVIDER_SITE_OTHER): Payer: Medicare Other | Admitting: Family Medicine

## 2020-12-19 VITALS — BP 98/70 | HR 69 | Temp 98.5°F | Resp 18 | Ht 62.0 in

## 2020-12-19 DIAGNOSIS — R4182 Altered mental status, unspecified: Secondary | ICD-10-CM | POA: Insufficient documentation

## 2020-12-19 DIAGNOSIS — R296 Repeated falls: Secondary | ICD-10-CM | POA: Diagnosis not present

## 2020-12-19 DIAGNOSIS — R197 Diarrhea, unspecified: Secondary | ICD-10-CM | POA: Insufficient documentation

## 2020-12-19 DIAGNOSIS — G20 Parkinson's disease: Secondary | ICD-10-CM | POA: Diagnosis not present

## 2020-12-19 DIAGNOSIS — M6281 Muscle weakness (generalized): Secondary | ICD-10-CM | POA: Diagnosis not present

## 2020-12-19 DIAGNOSIS — N390 Urinary tract infection, site not specified: Secondary | ICD-10-CM | POA: Diagnosis not present

## 2020-12-19 LAB — CBC WITH DIFFERENTIAL/PLATELET
Basophils Absolute: 0 10*3/uL (ref 0.0–0.1)
Basophils Relative: 0.6 % (ref 0.0–3.0)
Eosinophils Absolute: 0.2 10*3/uL (ref 0.0–0.7)
Eosinophils Relative: 2.7 % (ref 0.0–5.0)
HCT: 32.1 % — ABNORMAL LOW (ref 36.0–46.0)
Hemoglobin: 11 g/dL — ABNORMAL LOW (ref 12.0–15.0)
Lymphocytes Relative: 21.9 % (ref 12.0–46.0)
Lymphs Abs: 1.6 10*3/uL (ref 0.7–4.0)
MCHC: 34.3 g/dL (ref 30.0–36.0)
MCV: 94.5 fl (ref 78.0–100.0)
Monocytes Absolute: 0.6 10*3/uL (ref 0.1–1.0)
Monocytes Relative: 7.9 % (ref 3.0–12.0)
Neutro Abs: 4.8 10*3/uL (ref 1.4–7.7)
Neutrophils Relative %: 66.9 % (ref 43.0–77.0)
Platelets: 329 10*3/uL (ref 150.0–400.0)
RBC: 3.39 Mil/uL — ABNORMAL LOW (ref 3.87–5.11)
RDW: 14 % (ref 11.5–15.5)
WBC: 7.2 10*3/uL (ref 4.0–10.5)

## 2020-12-19 LAB — TSH: TSH: 1.37 u[IU]/mL (ref 0.35–4.50)

## 2020-12-19 NOTE — Assessment & Plan Note (Signed)
?   From abx Check c diff Check labs

## 2020-12-19 NOTE — Patient Instructions (Addendum)
  Pt needs to make her f/u appointment with the neurologist [facility] ER if symptoms do not improve      Preventing Falls and Fractures  Falls can be very serious, especially for older adults or people with osteoporosis  Falls can be caused by:  Tripping or slipping  Slow reflexes  Balance problems  Reduced muscle strength  Poor vision or a recent change in prescription  Illness and some medications (especially blood pressure pills, diuretics, heart medicines, muscle relaxants and sleep medications)  Drinking alcohol  To prevent falls outdoors:  Use a can or walker if needed  Wear rubber-soled shoes so you don't slip  DO NOT buy "shape up" shoes with rocker bottom soles if you have balance problems.  The thick soles and shape make it more difficult to keep your balance.  Put kitty litter or salt on icy sidewalks  Walk on the grass if the sidewalks are slick  Avoid walking on uneven ground whenever possible  T prevent falls indoors:  Keep rooms clutter-free, especially hallways, stairs and paths to light switches  Remove throw rugs  Install night lights, especially to and in the bathroom  Turn on lights before going downstairs  Keep a flashlight next to your bed  Buy a cordless phone to keep with you instead of jumping up to answer the phone  Install grab bars in the bathroom near the shower and toilet  Install rails on both sides of the stairs.  Make sure the stairs are well lit  Wear slippers with non-skid soles.  Do not walk around in stockings or socks  Balance problems and dizziness are not a normal part of growing older.  If you begin having balance problems or dizziness see your doctor.  Physical Therapy can help you with many balance problems, strengthening hip and leg muscles and with gait training.  To keep your bones healthy make sure you are getting enough calcium and Vitamin D each day.  Ask your doctor or pharmacist about supplements.  Regular  weight-bearing exercise like walking, lifting weights or dancing can help strengthen bones and prevent osteoporosis.

## 2020-12-19 NOTE — Progress Notes (Signed)
Patient ID: Elizabeth Mccullough, female    DOB: Nov 29, 1940  Age: 80 y.o. MRN: [id number]    Subjective:  Subjective  HPI Elizabeth Mccullough presents for an office visit today. She complains of falls, confusion, constant HA, diarrhea, and intermittent unsteadiness. The Pt has a hx of parkinson's disease and is accompanied by a helper. She states that the diarrhea had occurred a few days ago. She notes that she had incontinence this morning, when she stood up. She notes that, she had finished her antibiotics yesterday. She reports that she is leaning toward the left x1 day and her confusion had worsen. Her helper states that sometimes she is able to be on her foot.  She states that she had fallen on 12/18/2020. On 12/16/2020, Pt was admitted to the ED for fall.  She denies any chest pain, SOB, fever, abdominal pain, cough, chills, sore throat, dysuria, urinary incontinence, back pain,or N/V/D at this time. Pt has her care increase to 8 hours, from 9 AM-5 PM. Pt has been only taken 2-3 pills of 25-100 mg of Sinemet CR PO Daily for her dx of Parkison's.   Review of Systems  Unable to perform ROS: Mental status change  Constitutional: Negative for chills, fatigue and fever.       (+) falls  HENT: Negative for ear pain, rhinorrhea, sinus pressure, sinus pain, sore throat and tinnitus.   Eyes: Negative for pain.  Respiratory: Negative for cough, shortness of breath and wheezing.   Cardiovascular: Negative for chest pain.  Gastrointestinal: Positive for diarrhea. Negative for abdominal pain, anal bleeding, constipation, nausea and vomiting.  Genitourinary: Negative for flank pain.  Musculoskeletal: Negative for back pain and neck pain.  Skin: Negative for rash.  Neurological: Positive for headaches. Negative for seizures, light-headedness and numbness.       (+) unsteadiness  Psychiatric/Behavioral: Positive for confusion.    History Past Medical History:  Diagnosis Date  . AAA (abdominal  aortic aneurysm) ([place])   . Carotid artery disease ([place]) 01/12/2017   Carotid US 6/18: Bilat < 50%; repeat in 12/2000  . Chronic combined systolic and diastolic CHF (congestive heart failure) ([place]) 03/01/2017   NICM // [place] 8/18: normal coronary arteries /  Echo 8/18: EF 20-25, mod AI, mod MR, PASP 32 // Echo 1/19: EF 55-60, no RWMA, Gr 1 DD, mod AI, mild MR, PASP 32  . Hyperlipidemia   . Parkinson's disease [facility])     She has a past surgical history that includes Hammer toe surgery (04/10/02); Breast cyst aspiration (1965); Nasal septum surgery (1980); Knee surgery (Right, 10/17/15); RIGHT/LEFT HEART CATH AND CORONARY ANGIOGRAPHY (N/A, 03/03/2017); and THORACIC AORTOGRAM (N/A, 03/03/2017).   Her family history includes Breast cancer in an other family member; Cancer in her sister; Cancer (age of onset: 63) in her sister; Colon cancer in an other family member; Diabetes in her daughter and sister; Healthy in her daughter; Heart disease in her mother; Hyperlipidemia in her brother, sister, and sister; Hypertension in her brother; Prostate cancer in her son; Stomach cancer in her daughter; Stroke in her brother.She reports that she has never smoked. She has never used smokeless tobacco. She reports current alcohol use. She reports that she does not use drugs.     Objective:  Objective  Physical Exam Vitals and nursing note reviewed.  Constitutional:      General: She is not in acute distress.    Appearance: Normal appearance. She is well-developed. She is not ill-appearing.  HENT:  Head: Normocephalic and atraumatic.     Right Ear: External ear normal.     Left Ear: External ear normal.     Nose: Nose normal.  Eyes:     General:        Right eye: No discharge.        Left eye: No discharge.     Extraocular Movements: Extraocular movements intact.     Pupils: Pupils are equal, round, and reactive to light.  Cardiovascular:     Rate and Rhythm: Normal rate and regular rhythm.     Pulses:  Normal pulses.     Heart sounds: Normal heart sounds. No murmur heard. No friction rub. No gallop.   Pulmonary:     Effort: Pulmonary effort is normal. No respiratory distress.     Breath sounds: Normal breath sounds. No stridor. No wheezing, rhonchi or rales.  Chest:     Chest wall: No tenderness.  Abdominal:     General: Bowel sounds are normal. There is no distension.     Palpations: Abdomen is soft. There is no mass.     Tenderness: There is no abdominal tenderness. There is no guarding or rebound.     Hernia: No hernia is present.  Musculoskeletal:        General: Normal range of motion.     Cervical back: Normal range of motion and neck supple.     Right lower leg: No edema.     Left lower leg: No edema.  Skin:    General: Skin is warm and dry.  Neurological:     Motor: Tremor present.     Comments: The Pt is in a wheel chair. Pt has difficulty standing up and is experincing difficulty in balance. There is tremors present in Bilateral UE and Bilateral LE. Pt tremors had worsen.   Psychiatric:        Behavior: Behavior normal.        Thought Content: Thought content normal.    BP 98/70 (BP Location: Right Arm, Patient Position: Sitting, Cuff Size: Normal)   Pulse 69   Temp 98.5 F (36.9 C) (Oral)   Resp 18   Ht 5\' 2"  (1.575 m)   LMP 07/19/1988   SpO2 94%   BMI 24.19 kg/m  Wt Readings from Last 3 Encounters:  12/16/20 132 lb 4.4 oz (60 kg)  12/11/20 132 lb 3.2 oz (60 kg)  10/09/20 128 lb 12.8 oz (58.4 kg)     Lab Results  Component Value Date   WBC 6.7 10/09/2020   HGB 11.9 (L) 10/09/2020   HCT 35.6 (L) 10/09/2020   PLT 311.0 10/09/2020   GLUCOSE 106 (H) 10/09/2020   CHOL 197 10/09/2020   TRIG 108.0 10/09/2020   HDL 56.60 10/09/2020   LDLCALC 119 (H) 10/09/2020   ALT 6 10/09/2020   AST 10 10/09/2020   NA 142 10/09/2020   K 4.3 10/09/2020   CL 102 10/09/2020   CREATININE 1.31 (H) 10/09/2020   BUN 34 (H) 10/09/2020   CO2 31 10/09/2020   TSH 2.076  05/13/2020   INR 1.1 11/25/2019   HGBA1C 5.4 01/31/2019   MICROALBUR 0.2 01/16/2014    CT Head Wo Contrast  Result Date: 12/16/2020 CLINICAL DATA:  Multiple recent falls with posterior headaches, initial encounter EXAM: CT HEAD WITHOUT CONTRAST TECHNIQUE: Contiguous axial images were obtained from the base of the skull through the vertex without intravenous contrast. COMPARISON:  05/13/2020 FINDINGS: Brain: Mild atrophic changes and chronic white matter  ischemic changes are noted. No findings to suggest acute hemorrhage, acute infarction or space-occupying mass lesion are noted. Vascular: No hyperdense vessel or unexpected calcification. Skull: Normal. Negative for fracture or focal lesion. Sinuses/Orbits: No acute finding. Other: None. IMPRESSION: Chronic atrophic and ischemic changes without acute abnormality. Electronically Signed   By: Inez Catalina M.D.   On: 12/16/2020 19:29     Assessment & Plan:  Plan    No orders of the defined types were placed in this encounter.   Problem List Items Addressed This Visit      Unprioritized   Altered mental status    In last few days Check labs  Check ua  Ct head To er if symptoms worsen or if she falls again       Relevant Orders   CT Head Wo Contrast   Diarrhea - Primary    ? From abx Check c diff Check labs       Relevant Orders   CBC with Differential/Platelet   Comprehensive metabolic panel   TSH   Clostridium difficile EIA   Frequent falls    D/w daughter-- pt needs more care at home or needs placement in a facility      Relevant Orders   CT Head Wo Contrast   Parkinson disease ([place])    Pt needs neuro f/u  con't meds       Relevant Orders   CBC with Differential/Platelet   Comprehensive metabolic panel   TSH   Clostridium difficile EIA    Other Visit Diagnoses    Urinary tract infection without hematuria, site unspecified       Relevant Orders   POCT Urinalysis Dipstick (Automated)      Follow-up: Return  in about 3 months (around 03/21/2021), or if symptoms worsen or fail to improve.    I,Gordon Zheng,acting as a Education administrator for [employer], DO.,have documented all relevant documentation on the behalf of Ann Held, DO,as directed by  Ann Held, DO while in the presence of [place], DO, have reviewed all documentation for this visit. The documentation on 12/19/20 for the exam, diagnosis, procedures, and orders are all accurate and complete.

## 2020-12-19 NOTE — Assessment & Plan Note (Signed)
In last few days Check labs  Check ua  Ct head To er if symptoms worsen or if she falls again

## 2020-12-19 NOTE — Assessment & Plan Note (Signed)
D/w daughter-- pt needs more care at home or needs placement in a facility

## 2020-12-19 NOTE — Assessment & Plan Note (Signed)
Pt needs neuro f/u  con't meds

## 2020-12-22 LAB — COMPREHENSIVE METABOLIC PANEL
ALT: 6 U/L (ref 0–35)
AST: 19 U/L (ref 0–37)
Albumin: 4.5 g/dL (ref 3.5–5.2)
Alkaline Phosphatase: 83 U/L (ref 39–117)
BUN: 41 mg/dL — ABNORMAL HIGH (ref 6–23)
CO2: 22 mEq/L (ref 19–32)
Calcium: 9.2 mg/dL (ref 8.4–10.5)
Chloride: 105 mEq/L (ref 96–112)
Creatinine, Ser: 2.08 mg/dL — ABNORMAL HIGH (ref 0.40–1.20)
GFR: 21.82 mL/min — ABNORMAL LOW (ref >60.00)
Glucose, Bld: 93 mg/dL (ref 70–99)
Potassium: 4.6 mEq/L (ref 3.5–5.1)
Sodium: 141 mEq/L (ref 135–145)
Total Bilirubin: 0.5 mg/dL (ref 0.2–1.2)
Total Protein: 6.9 g/dL (ref 6.0–8.3)

## 2020-12-23 DIAGNOSIS — Z9181 History of falling: Secondary | ICD-10-CM | POA: Diagnosis not present

## 2020-12-23 DIAGNOSIS — M6281 Muscle weakness (generalized): Secondary | ICD-10-CM | POA: Diagnosis not present

## 2020-12-23 DIAGNOSIS — R2689 Other abnormalities of gait and mobility: Secondary | ICD-10-CM | POA: Diagnosis not present

## 2020-12-24 DIAGNOSIS — Z9181 History of falling: Secondary | ICD-10-CM | POA: Diagnosis not present

## 2020-12-24 DIAGNOSIS — M6281 Muscle weakness (generalized): Secondary | ICD-10-CM | POA: Diagnosis not present

## 2020-12-24 DIAGNOSIS — R2689 Other abnormalities of gait and mobility: Secondary | ICD-10-CM | POA: Diagnosis not present

## 2020-12-25 DIAGNOSIS — Z9181 History of falling: Secondary | ICD-10-CM | POA: Diagnosis not present

## 2020-12-25 DIAGNOSIS — M6281 Muscle weakness (generalized): Secondary | ICD-10-CM | POA: Diagnosis not present

## 2020-12-25 DIAGNOSIS — R2689 Other abnormalities of gait and mobility: Secondary | ICD-10-CM | POA: Diagnosis not present

## 2020-12-26 ENCOUNTER — Other Ambulatory Visit: Payer: Self-pay | Admitting: Family Medicine

## 2020-12-26 DIAGNOSIS — N289 Disorder of kidney and ureter, unspecified: Secondary | ICD-10-CM

## 2020-12-29 ENCOUNTER — Other Ambulatory Visit: Payer: Self-pay

## 2020-12-29 DIAGNOSIS — M6281 Muscle weakness (generalized): Secondary | ICD-10-CM | POA: Diagnosis not present

## 2020-12-29 DIAGNOSIS — N289 Disorder of kidney and ureter, unspecified: Secondary | ICD-10-CM

## 2020-12-29 NOTE — Progress Notes (Signed)
Pts daughter [name] is aware and voices understanding.

## 2020-12-30 ENCOUNTER — Other Ambulatory Visit: Payer: Self-pay | Admitting: Family Medicine

## 2020-12-30 ENCOUNTER — Other Ambulatory Visit: Payer: Self-pay | Admitting: *Deleted

## 2020-12-30 ENCOUNTER — Telehealth: Payer: Self-pay | Admitting: Family Medicine

## 2020-12-30 DIAGNOSIS — Z9181 History of falling: Secondary | ICD-10-CM | POA: Diagnosis not present

## 2020-12-30 DIAGNOSIS — M6281 Muscle weakness (generalized): Secondary | ICD-10-CM | POA: Diagnosis not present

## 2020-12-30 DIAGNOSIS — F419 Anxiety disorder, unspecified: Secondary | ICD-10-CM

## 2020-12-30 DIAGNOSIS — R197 Diarrhea, unspecified: Secondary | ICD-10-CM

## 2020-12-30 DIAGNOSIS — R2689 Other abnormalities of gait and mobility: Secondary | ICD-10-CM | POA: Diagnosis not present

## 2020-12-30 NOTE — Telephone Encounter (Signed)
Pt, daughter would like an antibiotic for UTI for her mother Because she  gets it quite often   Please advice

## 2020-12-30 NOTE — Telephone Encounter (Signed)
Requesting: Ativan  Contract: 07/26/2017 UDS: 07/26/2017 Last OV: 12/19/20 Next OV: N/A Last Refill: 10/09/20, #20--0 RF Database:   Please advise

## 2020-12-31 DIAGNOSIS — M6281 Muscle weakness (generalized): Secondary | ICD-10-CM | POA: Diagnosis not present

## 2020-12-31 MED ORDER — NITROFURANTOIN MACROCRYSTAL 50 MG PO CAPS: 50.0000 mg | ORAL_CAPSULE | Freq: Every day | ORAL | 2 refills | Status: DC

## 2020-12-31 NOTE — Telephone Encounter (Signed)
Medication sent.

## 2021-01-01 ENCOUNTER — Other Ambulatory Visit (INDEPENDENT_AMBULATORY_CARE_PROVIDER_SITE_OTHER): Payer: Medicare Other

## 2021-01-01 DIAGNOSIS — M6281 Muscle weakness (generalized): Secondary | ICD-10-CM | POA: Diagnosis not present

## 2021-01-01 DIAGNOSIS — R197 Diarrhea, unspecified: Secondary | ICD-10-CM

## 2021-01-01 DIAGNOSIS — Z9181 History of falling: Secondary | ICD-10-CM | POA: Diagnosis not present

## 2021-01-01 DIAGNOSIS — R2689 Other abnormalities of gait and mobility: Secondary | ICD-10-CM | POA: Diagnosis not present

## 2021-01-01 LAB — FECAL OCCULT BLOOD, IMMUNOCHEMICAL: Fecal Occult Bld: NEGATIVE

## 2021-01-02 DIAGNOSIS — R2689 Other abnormalities of gait and mobility: Secondary | ICD-10-CM | POA: Diagnosis not present

## 2021-01-02 DIAGNOSIS — Z9181 History of falling: Secondary | ICD-10-CM | POA: Diagnosis not present

## 2021-01-02 DIAGNOSIS — M6281 Muscle weakness (generalized): Secondary | ICD-10-CM | POA: Diagnosis not present

## 2021-01-05 DIAGNOSIS — M6281 Muscle weakness (generalized): Secondary | ICD-10-CM | POA: Diagnosis not present

## 2021-01-06 ENCOUNTER — Other Ambulatory Visit: Payer: Self-pay | Admitting: Family Medicine

## 2021-01-06 DIAGNOSIS — R2689 Other abnormalities of gait and mobility: Secondary | ICD-10-CM | POA: Diagnosis not present

## 2021-01-06 DIAGNOSIS — Z9181 History of falling: Secondary | ICD-10-CM | POA: Diagnosis not present

## 2021-01-06 DIAGNOSIS — R7989 Other specified abnormal findings of blood chemistry: Secondary | ICD-10-CM

## 2021-01-06 DIAGNOSIS — M6281 Muscle weakness (generalized): Secondary | ICD-10-CM | POA: Diagnosis not present

## 2021-01-07 ENCOUNTER — Other Ambulatory Visit: Payer: Self-pay | Admitting: Family Medicine

## 2021-01-07 DIAGNOSIS — R2689 Other abnormalities of gait and mobility: Secondary | ICD-10-CM | POA: Diagnosis not present

## 2021-01-07 DIAGNOSIS — Z9181 History of falling: Secondary | ICD-10-CM | POA: Diagnosis not present

## 2021-01-07 DIAGNOSIS — M6281 Muscle weakness (generalized): Secondary | ICD-10-CM | POA: Diagnosis not present

## 2021-01-08 DIAGNOSIS — M6281 Muscle weakness (generalized): Secondary | ICD-10-CM | POA: Diagnosis not present

## 2021-01-09 DIAGNOSIS — Z9181 History of falling: Secondary | ICD-10-CM | POA: Diagnosis not present

## 2021-01-09 DIAGNOSIS — R2689 Other abnormalities of gait and mobility: Secondary | ICD-10-CM | POA: Diagnosis not present

## 2021-01-09 DIAGNOSIS — M6281 Muscle weakness (generalized): Secondary | ICD-10-CM | POA: Diagnosis not present

## 2021-01-12 DIAGNOSIS — Z9181 History of falling: Secondary | ICD-10-CM | POA: Diagnosis not present

## 2021-01-12 DIAGNOSIS — M6281 Muscle weakness (generalized): Secondary | ICD-10-CM | POA: Diagnosis not present

## 2021-01-12 DIAGNOSIS — R2689 Other abnormalities of gait and mobility: Secondary | ICD-10-CM | POA: Diagnosis not present

## 2021-01-13 DIAGNOSIS — M6281 Muscle weakness (generalized): Secondary | ICD-10-CM | POA: Diagnosis not present

## 2021-01-13 DIAGNOSIS — R2689 Other abnormalities of gait and mobility: Secondary | ICD-10-CM | POA: Diagnosis not present

## 2021-01-13 DIAGNOSIS — Z9181 History of falling: Secondary | ICD-10-CM | POA: Diagnosis not present

## 2021-01-14 ENCOUNTER — Telehealth: Payer: Self-pay

## 2021-01-14 NOTE — Telephone Encounter (Signed)
-----   Message from Ann Held, DO sent at 01/06/2021  9:30 AM EDT ----- Received message from nephrology that we need to stop the lisinopril --- due to cr elevation and recheck labs in 2 weeks  Her bp was low at last ov so that should be fine to due  Recheck bp in 2 weeks as well ---- nurse visit ok

## 2021-01-14 NOTE — Telephone Encounter (Signed)
Spoke with patient's daughter. She verbalized understanding. She states she will be going out of town this week for about 2 weeks and will call when she is back to schedule appointments. She also stated she will have the patient D/C the lisinopril.

## 2021-01-16 DIAGNOSIS — Z9181 History of falling: Secondary | ICD-10-CM | POA: Diagnosis not present

## 2021-01-16 DIAGNOSIS — M6281 Muscle weakness (generalized): Secondary | ICD-10-CM | POA: Diagnosis not present

## 2021-01-16 DIAGNOSIS — R2689 Other abnormalities of gait and mobility: Secondary | ICD-10-CM | POA: Diagnosis not present

## 2021-01-20 DIAGNOSIS — R2689 Other abnormalities of gait and mobility: Secondary | ICD-10-CM | POA: Diagnosis not present

## 2021-01-20 DIAGNOSIS — Z9181 History of falling: Secondary | ICD-10-CM | POA: Diagnosis not present

## 2021-01-20 DIAGNOSIS — M6281 Muscle weakness (generalized): Secondary | ICD-10-CM | POA: Diagnosis not present

## 2021-01-22 DIAGNOSIS — M6281 Muscle weakness (generalized): Secondary | ICD-10-CM | POA: Diagnosis not present

## 2021-01-23 DIAGNOSIS — M6281 Muscle weakness (generalized): Secondary | ICD-10-CM | POA: Diagnosis not present

## 2021-01-27 DIAGNOSIS — M6281 Muscle weakness (generalized): Secondary | ICD-10-CM | POA: Diagnosis not present

## 2021-01-27 DIAGNOSIS — R2689 Other abnormalities of gait and mobility: Secondary | ICD-10-CM | POA: Diagnosis not present

## 2021-01-27 DIAGNOSIS — Z9181 History of falling: Secondary | ICD-10-CM | POA: Diagnosis not present

## 2021-01-28 DIAGNOSIS — R2689 Other abnormalities of gait and mobility: Secondary | ICD-10-CM | POA: Diagnosis not present

## 2021-01-28 DIAGNOSIS — M6281 Muscle weakness (generalized): Secondary | ICD-10-CM | POA: Diagnosis not present

## 2021-01-28 DIAGNOSIS — Z9181 History of falling: Secondary | ICD-10-CM | POA: Diagnosis not present

## 2021-01-29 DIAGNOSIS — M6281 Muscle weakness (generalized): Secondary | ICD-10-CM | POA: Diagnosis not present

## 2021-01-29 DIAGNOSIS — R2689 Other abnormalities of gait and mobility: Secondary | ICD-10-CM | POA: Diagnosis not present

## 2021-01-29 DIAGNOSIS — Z9181 History of falling: Secondary | ICD-10-CM | POA: Diagnosis not present

## 2021-01-30 DIAGNOSIS — M6281 Muscle weakness (generalized): Secondary | ICD-10-CM | POA: Diagnosis not present

## 2021-01-30 DIAGNOSIS — Z9181 History of falling: Secondary | ICD-10-CM | POA: Diagnosis not present

## 2021-01-30 DIAGNOSIS — R2689 Other abnormalities of gait and mobility: Secondary | ICD-10-CM | POA: Diagnosis not present

## 2021-02-02 DIAGNOSIS — R2689 Other abnormalities of gait and mobility: Secondary | ICD-10-CM | POA: Diagnosis not present

## 2021-02-02 DIAGNOSIS — Z9181 History of falling: Secondary | ICD-10-CM | POA: Diagnosis not present

## 2021-02-02 DIAGNOSIS — M6281 Muscle weakness (generalized): Secondary | ICD-10-CM | POA: Diagnosis not present

## 2021-02-03 DIAGNOSIS — Z9181 History of falling: Secondary | ICD-10-CM | POA: Diagnosis not present

## 2021-02-03 DIAGNOSIS — M6281 Muscle weakness (generalized): Secondary | ICD-10-CM | POA: Diagnosis not present

## 2021-02-03 DIAGNOSIS — R2689 Other abnormalities of gait and mobility: Secondary | ICD-10-CM | POA: Diagnosis not present

## 2021-02-04 DIAGNOSIS — M6281 Muscle weakness (generalized): Secondary | ICD-10-CM | POA: Diagnosis not present

## 2021-02-05 DIAGNOSIS — R2689 Other abnormalities of gait and mobility: Secondary | ICD-10-CM | POA: Diagnosis not present

## 2021-02-05 DIAGNOSIS — Z9181 History of falling: Secondary | ICD-10-CM | POA: Diagnosis not present

## 2021-02-05 DIAGNOSIS — M6281 Muscle weakness (generalized): Secondary | ICD-10-CM | POA: Diagnosis not present

## 2021-02-06 DIAGNOSIS — Z9181 History of falling: Secondary | ICD-10-CM | POA: Diagnosis not present

## 2021-02-06 DIAGNOSIS — R41841 Cognitive communication deficit: Secondary | ICD-10-CM | POA: Diagnosis not present

## 2021-02-06 DIAGNOSIS — M6281 Muscle weakness (generalized): Secondary | ICD-10-CM | POA: Diagnosis not present

## 2021-02-06 DIAGNOSIS — R488 Other symbolic dysfunctions: Secondary | ICD-10-CM | POA: Diagnosis not present

## 2021-02-09 DIAGNOSIS — M6281 Muscle weakness (generalized): Secondary | ICD-10-CM | POA: Diagnosis not present

## 2021-02-09 DIAGNOSIS — Z9181 History of falling: Secondary | ICD-10-CM | POA: Diagnosis not present

## 2021-02-09 DIAGNOSIS — R2689 Other abnormalities of gait and mobility: Secondary | ICD-10-CM | POA: Diagnosis not present

## 2021-02-10 DIAGNOSIS — Z9181 History of falling: Secondary | ICD-10-CM | POA: Diagnosis not present

## 2021-02-10 DIAGNOSIS — R41841 Cognitive communication deficit: Secondary | ICD-10-CM | POA: Diagnosis not present

## 2021-02-10 DIAGNOSIS — R2689 Other abnormalities of gait and mobility: Secondary | ICD-10-CM | POA: Diagnosis not present

## 2021-02-10 DIAGNOSIS — R488 Other symbolic dysfunctions: Secondary | ICD-10-CM | POA: Diagnosis not present

## 2021-02-10 DIAGNOSIS — M6281 Muscle weakness (generalized): Secondary | ICD-10-CM | POA: Diagnosis not present

## 2021-02-11 DIAGNOSIS — M6281 Muscle weakness (generalized): Secondary | ICD-10-CM | POA: Diagnosis not present

## 2021-02-12 DIAGNOSIS — R41841 Cognitive communication deficit: Secondary | ICD-10-CM | POA: Diagnosis not present

## 2021-02-12 DIAGNOSIS — Z9181 History of falling: Secondary | ICD-10-CM | POA: Diagnosis not present

## 2021-02-12 DIAGNOSIS — R488 Other symbolic dysfunctions: Secondary | ICD-10-CM | POA: Diagnosis not present

## 2021-02-12 DIAGNOSIS — M6281 Muscle weakness (generalized): Secondary | ICD-10-CM | POA: Diagnosis not present

## 2021-02-12 DIAGNOSIS — R2689 Other abnormalities of gait and mobility: Secondary | ICD-10-CM | POA: Diagnosis not present

## 2021-02-13 DIAGNOSIS — M6281 Muscle weakness (generalized): Secondary | ICD-10-CM | POA: Diagnosis not present

## 2021-02-13 DIAGNOSIS — R488 Other symbolic dysfunctions: Secondary | ICD-10-CM | POA: Diagnosis not present

## 2021-02-13 DIAGNOSIS — Z9181 History of falling: Secondary | ICD-10-CM | POA: Diagnosis not present

## 2021-02-13 DIAGNOSIS — R2689 Other abnormalities of gait and mobility: Secondary | ICD-10-CM | POA: Diagnosis not present

## 2021-02-13 DIAGNOSIS — R41841 Cognitive communication deficit: Secondary | ICD-10-CM | POA: Diagnosis not present

## 2021-02-16 ENCOUNTER — Other Ambulatory Visit: Payer: Self-pay

## 2021-02-16 ENCOUNTER — Other Ambulatory Visit (INDEPENDENT_AMBULATORY_CARE_PROVIDER_SITE_OTHER): Payer: Medicare Other

## 2021-02-16 ENCOUNTER — Telehealth: Payer: Self-pay

## 2021-02-16 DIAGNOSIS — R350 Frequency of micturition: Secondary | ICD-10-CM

## 2021-02-16 DIAGNOSIS — M6281 Muscle weakness (generalized): Secondary | ICD-10-CM | POA: Diagnosis not present

## 2021-02-16 DIAGNOSIS — Z87440 Personal history of urinary (tract) infections: Secondary | ICD-10-CM

## 2021-02-16 DIAGNOSIS — R300 Dysuria: Secondary | ICD-10-CM

## 2021-02-16 NOTE — Telephone Encounter (Signed)
Pt's daughter is wondering if we could just a urine sample from pt to get tested for UTI. Is ok for me to put the UA and Culture order in?

## 2021-02-16 NOTE — Telephone Encounter (Signed)
Pt's daughter called. LVM to schedule lab appointment. Labs ordered

## 2021-02-17 ENCOUNTER — Other Ambulatory Visit: Payer: Self-pay | Admitting: Family Medicine

## 2021-02-17 DIAGNOSIS — Z9181 History of falling: Secondary | ICD-10-CM | POA: Diagnosis not present

## 2021-02-17 DIAGNOSIS — R2689 Other abnormalities of gait and mobility: Secondary | ICD-10-CM | POA: Diagnosis not present

## 2021-02-17 DIAGNOSIS — R41841 Cognitive communication deficit: Secondary | ICD-10-CM | POA: Diagnosis not present

## 2021-02-17 DIAGNOSIS — M6281 Muscle weakness (generalized): Secondary | ICD-10-CM | POA: Diagnosis not present

## 2021-02-17 DIAGNOSIS — R488 Other symbolic dysfunctions: Secondary | ICD-10-CM | POA: Diagnosis not present

## 2021-02-17 LAB — URINALYSIS, ROUTINE W REFLEX MICROSCOPIC
Bilirubin Urine: NEGATIVE
Hgb urine dipstick: NEGATIVE
Ketones, ur: NEGATIVE
Nitrite: POSITIVE — AB
Specific Gravity, Urine: 1.015 (ref 1.000–1.030)
Urine Glucose: NEGATIVE
Urobilinogen, UA: 0.2 (ref 0.0–1.0)
pH: 6.5 (ref 5.0–8.0)

## 2021-02-18 ENCOUNTER — Other Ambulatory Visit: Payer: Self-pay

## 2021-02-18 DIAGNOSIS — R2689 Other abnormalities of gait and mobility: Secondary | ICD-10-CM | POA: Diagnosis not present

## 2021-02-18 DIAGNOSIS — M6281 Muscle weakness (generalized): Secondary | ICD-10-CM | POA: Diagnosis not present

## 2021-02-18 DIAGNOSIS — Z9181 History of falling: Secondary | ICD-10-CM | POA: Diagnosis not present

## 2021-02-18 LAB — URINE CULTURE
MICRO NUMBER:: 12185775
SPECIMEN QUALITY:: ADEQUATE

## 2021-02-18 MED ORDER — NITROFURANTOIN MONOHYD MACRO 100 MG PO CAPS: 100.0000 mg | ORAL_CAPSULE | Freq: Two times a day (BID) | ORAL | 0 refills | Status: DC

## 2021-02-19 DIAGNOSIS — R41841 Cognitive communication deficit: Secondary | ICD-10-CM | POA: Diagnosis not present

## 2021-02-19 DIAGNOSIS — R2689 Other abnormalities of gait and mobility: Secondary | ICD-10-CM | POA: Diagnosis not present

## 2021-02-19 DIAGNOSIS — M6281 Muscle weakness (generalized): Secondary | ICD-10-CM | POA: Diagnosis not present

## 2021-02-19 DIAGNOSIS — R488 Other symbolic dysfunctions: Secondary | ICD-10-CM | POA: Diagnosis not present

## 2021-02-19 DIAGNOSIS — Z9181 History of falling: Secondary | ICD-10-CM | POA: Diagnosis not present

## 2021-02-19 MED ORDER — CIPROFLOXACIN HCL 250 MG PO TABS: 250.0000 mg | ORAL_TABLET | Freq: Two times a day (BID) | ORAL | 0 refills | Status: AC

## 2021-02-19 NOTE — Addendum Note (Signed)
Addended by: Wynonia Musty A on: 02/19/2021 04:13 PM   Modules accepted: Orders

## 2021-02-20 DIAGNOSIS — M6281 Muscle weakness (generalized): Secondary | ICD-10-CM | POA: Diagnosis not present

## 2021-02-20 DIAGNOSIS — R2689 Other abnormalities of gait and mobility: Secondary | ICD-10-CM | POA: Diagnosis not present

## 2021-02-20 DIAGNOSIS — Z9181 History of falling: Secondary | ICD-10-CM | POA: Diagnosis not present

## 2021-02-20 DIAGNOSIS — R488 Other symbolic dysfunctions: Secondary | ICD-10-CM | POA: Diagnosis not present

## 2021-02-20 DIAGNOSIS — R41841 Cognitive communication deficit: Secondary | ICD-10-CM | POA: Diagnosis not present

## 2021-02-23 DIAGNOSIS — M6281 Muscle weakness (generalized): Secondary | ICD-10-CM | POA: Diagnosis not present

## 2021-02-24 DIAGNOSIS — Z9181 History of falling: Secondary | ICD-10-CM | POA: Diagnosis not present

## 2021-02-24 DIAGNOSIS — R41841 Cognitive communication deficit: Secondary | ICD-10-CM | POA: Diagnosis not present

## 2021-02-24 DIAGNOSIS — R488 Other symbolic dysfunctions: Secondary | ICD-10-CM | POA: Diagnosis not present

## 2021-02-26 DIAGNOSIS — M6281 Muscle weakness (generalized): Secondary | ICD-10-CM | POA: Diagnosis not present

## 2021-02-26 DIAGNOSIS — R41841 Cognitive communication deficit: Secondary | ICD-10-CM | POA: Diagnosis not present

## 2021-02-26 DIAGNOSIS — Z9181 History of falling: Secondary | ICD-10-CM | POA: Diagnosis not present

## 2021-02-26 DIAGNOSIS — R488 Other symbolic dysfunctions: Secondary | ICD-10-CM | POA: Diagnosis not present

## 2021-02-27 DIAGNOSIS — R488 Other symbolic dysfunctions: Secondary | ICD-10-CM | POA: Diagnosis not present

## 2021-02-27 DIAGNOSIS — R41841 Cognitive communication deficit: Secondary | ICD-10-CM | POA: Diagnosis not present

## 2021-02-27 DIAGNOSIS — Z9181 History of falling: Secondary | ICD-10-CM | POA: Diagnosis not present

## 2021-02-27 DIAGNOSIS — M6281 Muscle weakness (generalized): Secondary | ICD-10-CM | POA: Diagnosis not present

## 2021-03-02 DIAGNOSIS — M6281 Muscle weakness (generalized): Secondary | ICD-10-CM | POA: Diagnosis not present

## 2021-03-03 DIAGNOSIS — R41841 Cognitive communication deficit: Secondary | ICD-10-CM | POA: Diagnosis not present

## 2021-03-03 DIAGNOSIS — M6281 Muscle weakness (generalized): Secondary | ICD-10-CM | POA: Diagnosis not present

## 2021-03-03 DIAGNOSIS — Z9181 History of falling: Secondary | ICD-10-CM | POA: Diagnosis not present

## 2021-03-03 DIAGNOSIS — R488 Other symbolic dysfunctions: Secondary | ICD-10-CM | POA: Diagnosis not present

## 2021-03-04 DIAGNOSIS — M6281 Muscle weakness (generalized): Secondary | ICD-10-CM | POA: Diagnosis not present

## 2021-03-05 DIAGNOSIS — R41841 Cognitive communication deficit: Secondary | ICD-10-CM | POA: Diagnosis not present

## 2021-03-05 DIAGNOSIS — R488 Other symbolic dysfunctions: Secondary | ICD-10-CM | POA: Diagnosis not present

## 2021-03-05 DIAGNOSIS — Z9181 History of falling: Secondary | ICD-10-CM | POA: Diagnosis not present

## 2021-03-06 DIAGNOSIS — R488 Other symbolic dysfunctions: Secondary | ICD-10-CM | POA: Diagnosis not present

## 2021-03-06 DIAGNOSIS — R41841 Cognitive communication deficit: Secondary | ICD-10-CM | POA: Diagnosis not present

## 2021-03-06 DIAGNOSIS — Z9181 History of falling: Secondary | ICD-10-CM | POA: Diagnosis not present

## 2021-03-07 ENCOUNTER — Other Ambulatory Visit: Payer: Self-pay | Admitting: Family Medicine

## 2021-03-07 DIAGNOSIS — F419 Anxiety disorder, unspecified: Secondary | ICD-10-CM

## 2021-03-09 NOTE — Telephone Encounter (Signed)
Requesting:Lorazepam 0.'5mg'$    Contract: 07/26/2017 UDS: 07/26/2017 Last Visit: 10/09/2020 (anxiety appt) Next Visit: None.  Last Refill: 12/31/2020  #20, no refills.   Please Advise

## 2021-03-10 DIAGNOSIS — R41841 Cognitive communication deficit: Secondary | ICD-10-CM | POA: Diagnosis not present

## 2021-03-10 DIAGNOSIS — R488 Other symbolic dysfunctions: Secondary | ICD-10-CM | POA: Diagnosis not present

## 2021-03-10 DIAGNOSIS — Z9181 History of falling: Secondary | ICD-10-CM | POA: Diagnosis not present

## 2021-03-13 DIAGNOSIS — Z9181 History of falling: Secondary | ICD-10-CM | POA: Diagnosis not present

## 2021-03-13 DIAGNOSIS — R488 Other symbolic dysfunctions: Secondary | ICD-10-CM | POA: Diagnosis not present

## 2021-03-13 DIAGNOSIS — R41841 Cognitive communication deficit: Secondary | ICD-10-CM | POA: Diagnosis not present

## 2021-03-17 DIAGNOSIS — R488 Other symbolic dysfunctions: Secondary | ICD-10-CM | POA: Diagnosis not present

## 2021-03-17 DIAGNOSIS — R41841 Cognitive communication deficit: Secondary | ICD-10-CM | POA: Diagnosis not present

## 2021-03-17 DIAGNOSIS — Z9181 History of falling: Secondary | ICD-10-CM | POA: Diagnosis not present

## 2021-03-19 DIAGNOSIS — R488 Other symbolic dysfunctions: Secondary | ICD-10-CM | POA: Diagnosis not present

## 2021-03-19 DIAGNOSIS — Z9181 History of falling: Secondary | ICD-10-CM | POA: Diagnosis not present

## 2021-03-19 DIAGNOSIS — R41841 Cognitive communication deficit: Secondary | ICD-10-CM | POA: Diagnosis not present

## 2021-03-20 DIAGNOSIS — R488 Other symbolic dysfunctions: Secondary | ICD-10-CM | POA: Diagnosis not present

## 2021-03-20 DIAGNOSIS — Z9181 History of falling: Secondary | ICD-10-CM | POA: Diagnosis not present

## 2021-03-20 DIAGNOSIS — R41841 Cognitive communication deficit: Secondary | ICD-10-CM | POA: Diagnosis not present

## 2021-03-24 DIAGNOSIS — R41841 Cognitive communication deficit: Secondary | ICD-10-CM | POA: Diagnosis not present

## 2021-03-24 DIAGNOSIS — Z9181 History of falling: Secondary | ICD-10-CM | POA: Diagnosis not present

## 2021-03-24 DIAGNOSIS — R488 Other symbolic dysfunctions: Secondary | ICD-10-CM | POA: Diagnosis not present

## 2021-03-25 ENCOUNTER — Other Ambulatory Visit: Payer: Self-pay | Admitting: Family Medicine

## 2021-03-25 ENCOUNTER — Telehealth: Payer: Self-pay | Admitting: Family Medicine

## 2021-03-25 DIAGNOSIS — N390 Urinary tract infection, site not specified: Secondary | ICD-10-CM

## 2021-03-25 MED ORDER — CIPROFLOXACIN HCL 250 MG PO TABS: 250.0000 mg | ORAL_TABLET | Freq: Two times a day (BID) | ORAL | 0 refills | Status: DC

## 2021-03-25 NOTE — Telephone Encounter (Signed)
Patient's daughter called stating her mom has another UTI and would like for Low[facility] to send her Cipro to her [facility] to take care of it. Pt's daughter stated they took the at home test for UTI and it showed her mom did have one. She would like her mom's med to be sent to:  [facility] [place]  [address] [place] [address]  Phone:  [phone]  Fax:  [phone]  Please Advise.

## 2021-03-26 DIAGNOSIS — Z9181 History of falling: Secondary | ICD-10-CM | POA: Diagnosis not present

## 2021-03-26 DIAGNOSIS — R488 Other symbolic dysfunctions: Secondary | ICD-10-CM | POA: Diagnosis not present

## 2021-03-26 DIAGNOSIS — R41841 Cognitive communication deficit: Secondary | ICD-10-CM | POA: Diagnosis not present

## 2021-03-26 NOTE — Telephone Encounter (Signed)
Pt's daughter made aware.

## 2021-03-31 DIAGNOSIS — Z9181 History of falling: Secondary | ICD-10-CM | POA: Diagnosis not present

## 2021-03-31 DIAGNOSIS — R41841 Cognitive communication deficit: Secondary | ICD-10-CM | POA: Diagnosis not present

## 2021-03-31 DIAGNOSIS — R488 Other symbolic dysfunctions: Secondary | ICD-10-CM | POA: Diagnosis not present

## 2021-04-02 DIAGNOSIS — R488 Other symbolic dysfunctions: Secondary | ICD-10-CM | POA: Diagnosis not present

## 2021-04-02 DIAGNOSIS — R41841 Cognitive communication deficit: Secondary | ICD-10-CM | POA: Diagnosis not present

## 2021-04-02 DIAGNOSIS — Z9181 History of falling: Secondary | ICD-10-CM | POA: Diagnosis not present

## 2021-04-03 DIAGNOSIS — Z9181 History of falling: Secondary | ICD-10-CM | POA: Diagnosis not present

## 2021-04-03 DIAGNOSIS — R41841 Cognitive communication deficit: Secondary | ICD-10-CM | POA: Diagnosis not present

## 2021-04-03 DIAGNOSIS — R488 Other symbolic dysfunctions: Secondary | ICD-10-CM | POA: Diagnosis not present

## 2021-04-07 ENCOUNTER — Other Ambulatory Visit: Payer: Self-pay | Admitting: Family Medicine

## 2021-04-07 DIAGNOSIS — Z9181 History of falling: Secondary | ICD-10-CM | POA: Diagnosis not present

## 2021-04-07 DIAGNOSIS — R41841 Cognitive communication deficit: Secondary | ICD-10-CM | POA: Diagnosis not present

## 2021-04-07 DIAGNOSIS — R488 Other symbolic dysfunctions: Secondary | ICD-10-CM | POA: Diagnosis not present

## 2021-04-07 DIAGNOSIS — F419 Anxiety disorder, unspecified: Secondary | ICD-10-CM

## 2021-04-08 DIAGNOSIS — Z9181 History of falling: Secondary | ICD-10-CM | POA: Diagnosis not present

## 2021-04-08 DIAGNOSIS — R41841 Cognitive communication deficit: Secondary | ICD-10-CM | POA: Diagnosis not present

## 2021-04-08 DIAGNOSIS — R488 Other symbolic dysfunctions: Secondary | ICD-10-CM | POA: Diagnosis not present

## 2021-04-09 ENCOUNTER — Other Ambulatory Visit: Payer: Self-pay | Admitting: Family Medicine

## 2021-04-09 ENCOUNTER — Other Ambulatory Visit: Payer: Self-pay

## 2021-04-09 NOTE — Telephone Encounter (Signed)
Is Pt on long term abx? Okay to refill?

## 2021-04-10 DIAGNOSIS — Z9181 History of falling: Secondary | ICD-10-CM | POA: Diagnosis not present

## 2021-04-10 DIAGNOSIS — R488 Other symbolic dysfunctions: Secondary | ICD-10-CM | POA: Diagnosis not present

## 2021-04-10 DIAGNOSIS — R41841 Cognitive communication deficit: Secondary | ICD-10-CM | POA: Diagnosis not present

## 2021-04-13 ENCOUNTER — Telehealth: Payer: Self-pay | Admitting: Family Medicine

## 2021-04-13 NOTE — Telephone Encounter (Signed)
Left message for patient to call back and schedule Medicare Annual Wellness Visit (AWV) in office.   If not able to come in office, please offer to do virtually or by telephone.  Left office number and my jabber [phone].  Last AWV:12/18/2019  Please schedule at anytime with Nurse Health Advisor.

## 2021-04-15 DIAGNOSIS — R488 Other symbolic dysfunctions: Secondary | ICD-10-CM | POA: Diagnosis not present

## 2021-04-15 DIAGNOSIS — Z9181 History of falling: Secondary | ICD-10-CM | POA: Diagnosis not present

## 2021-04-15 DIAGNOSIS — R41841 Cognitive communication deficit: Secondary | ICD-10-CM | POA: Diagnosis not present

## 2021-04-16 ENCOUNTER — Ambulatory Visit (INDEPENDENT_AMBULATORY_CARE_PROVIDER_SITE_OTHER): Payer: Medicare Other | Admitting: Family Medicine

## 2021-04-16 ENCOUNTER — Other Ambulatory Visit: Payer: Self-pay

## 2021-04-16 VITALS — BP 83/48 | HR 71 | Resp 12

## 2021-04-16 DIAGNOSIS — I10 Essential (primary) hypertension: Secondary | ICD-10-CM

## 2021-04-16 DIAGNOSIS — Z23 Encounter for immunization: Secondary | ICD-10-CM

## 2021-04-16 DIAGNOSIS — F419 Anxiety disorder, unspecified: Secondary | ICD-10-CM

## 2021-04-16 DIAGNOSIS — E785 Hyperlipidemia, unspecified: Secondary | ICD-10-CM

## 2021-04-16 DIAGNOSIS — G20 Parkinson's disease: Secondary | ICD-10-CM | POA: Diagnosis not present

## 2021-04-16 DIAGNOSIS — F32A Depression, unspecified: Secondary | ICD-10-CM

## 2021-04-16 DIAGNOSIS — R350 Frequency of micturition: Secondary | ICD-10-CM

## 2021-04-16 LAB — POC URINALSYSI DIPSTICK (AUTOMATED)
Bilirubin, UA: NEGATIVE
Blood, UA: NEGATIVE
Glucose, UA: NEGATIVE
Ketones, UA: NEGATIVE
Leukocytes, UA: NEGATIVE
Nitrite, UA: NEGATIVE
Protein, UA: NEGATIVE
Spec Grav, UA: 1.015 (ref 1.010–1.025)
Urobilinogen, UA: 0.2 E.U./dL
pH, UA: 6 (ref 5.0–8.0)

## 2021-04-16 MED ORDER — LORAZEPAM 0.5 MG PO TABS: ORAL_TABLET | ORAL | 0 refills | Status: DC

## 2021-04-16 NOTE — Assessment & Plan Note (Signed)
con't meds Ativan tid prn

## 2021-04-16 NOTE — Assessment & Plan Note (Signed)
Per neuro 

## 2021-04-16 NOTE — Patient Instructions (Signed)
Dysuria ?Dysuria is pain or discomfort during urination. The pain or discomfort may be felt in the part of the body that drains urine from the bladder (urethra) or in the surrounding tissue of the genitals. The pain may also be felt in the groin area, lower abdomen, or lower back. ?You may have to urinate frequently or have the sudden feeling that you have to urinate (urgency). Dysuria can affect anyone, but it is more common in females. Dysuria can be caused by many different things, including: ?Urinary tract infection. ?Kidney stones or bladder stones. ?Certain STIs (sexually transmitted infections), such as chlamydia. ?Dehydration. ?Inflammation of the tissues of the vagina. ?Use of certain medicines. ?Use of certain soaps or scented products that cause irritation. ?Follow these instructions at home: ?Medicines ?Take over-the-counter and prescription medicines only as told by your health care provider. ?If you were prescribed an antibiotic medicine, take it as told by your health care provider. Do not stop taking the antibiotic even if you start to feel better. ?Eating and drinking ? ?Drink enough fluid to keep your urine pale yellow. ?Avoid caffeinated beverages, tea, and alcohol. These beverages can irritate the bladder and make dysuria worse. In males, alcohol may irritate the prostate. ?General instructions ?Watch your condition for any changes. ?Urinate often. Avoid holding urine for long periods of time. ?If you are female, you should wipe from front to back after urinating or having a bowel movement. Use each piece of toilet paper only once. ?Empty your bladder after sex. ?Keep all follow-up visits. This is important. ?If you had any tests done to find the cause of dysuria, it is up to you to get your test results. Ask your health care provider, or the department that is doing the test, when your results will be ready. ?Contact a health care provider if: ?You have a fever. ?You develop pain in your back or  sides. ?You have nausea or vomiting. ?You have blood in your urine. ?You are not urinating as often as you usually do. ?Get help right away if: ?Your pain is severe and not relieved with medicines. ?You cannot eat or drink without vomiting. ?You are confused. ?You have a rapid heartbeat while resting. ?You have shaking or chills. ?You feel extremely weak. ?Summary ?Dysuria is pain or discomfort while urinating. Many different conditions can lead to dysuria. ?If you have dysuria, you may have to urinate frequently or have the sudden feeling that you have to urinate (urgency). ?Watch your condition for any changes. Keep all follow-up visits. ?Make sure that you urinate often and drink enough fluid to keep your urine pale yellow. ?This information is not intended to replace advice given to you by your health care provider. Make sure you discuss any questions you have with your health care provider. ?Document Revised: 02/15/2020 Document Reviewed: 02/15/2020 ?Elsevier Patient Education ? 2022 Elsevier Inc. ? ?

## 2021-04-16 NOTE — Progress Notes (Signed)
Established Patient Office Visit  Subjective:  Patient ID: Elizabeth Mccullough, female    DOB: May 11, 1941  Age: 80 y.o. MRN: [id number]  CC:  Chief Complaint  Patient presents with   Urinary Frequency   disoriented   does not want to sit down    HPI Elizabeth Mccullough presents for urinary frequency and disoriented.   She is getting up and wandering more ------ativan refill needed   Past Medical History:  Diagnosis Date   AAA (abdominal aortic aneurysm)    Carotid artery disease ([place]) 01/12/2017   Carotid US 6/18: Bilat < 50%; repeat in 12/2000   Chronic combined systolic and diastolic CHF (congestive heart failure) ([place]) 03/01/2017   NICM // [place] 8/18: normal coronary arteries /  Echo 8/18: EF 20-25, mod AI, mod MR, PASP 32 // Echo 1/19: EF 55-60, no RWMA, Gr 1 DD, mod AI, mild MR, PASP 32   Hyperlipidemia    Parkinson's disease ([place])     Past Surgical History:  Procedure Laterality Date   BREAST CYST ASPIRATION  1965   Right Breast   HAMMER TOE SURGERY  04/10/02   Left Toe   KNEE SURGERY Right 10/17/15   meniscus repair   NASAL SEPTUM SURGERY  1980   RIGHT/LEFT HEART CATH AND CORONARY ANGIOGRAPHY N/A 03/03/2017   Procedure: RIGHT/LEFT HEART CATH AND CORONARY ANGIOGRAPHY;  Surgeon: Nelva Bush, MD;  Location: [facility];  Service: Cardiovascular;  Laterality: N/A;   THORACIC AORTOGRAM N/A 03/03/2017   Procedure: Thoracic Aortogram;  Surgeon: Nelva Bush, MD;  Location: [facility];  Service: Cardiovascular;  Laterality: N/A;    Family History  Problem Relation Age of Onset   Heart disease Mother    Hyperlipidemia Sister    Hyperlipidemia Brother    Diabetes Sister    Hyperlipidemia Sister    Cancer Sister 11       colon   Cancer Sister        breast   Stroke Brother    Hypertension Brother    Breast cancer Other    Colon cancer Other    Prostate cancer Son    Diabetes Daughter    Stomach cancer Daughter    Healthy Daughter      Social History   Socioeconomic History   Marital status: Widowed    Spouse name: Not on file   Number of children: 4   Years of education: Not on file   Highest education level: 12th grade  Occupational History   Occupation: retired  Tobacco Use   Smoking status: Never   Smokeless tobacco: Never  Vaping Use   Vaping Use: Never used  Substance and Sexual Activity   Alcohol use: Yes    Comment: social wine    Drug use: No   Sexual activity: Not Currently    Partners: Male  Other Topics Concern   Not on file  Social History Narrative   Exercise-- no   Pt lives with daughter [name] at home   Social Determinants of Health   Financial Resource Strain: Not on file  Food Insecurity: Not on file  Transportation Needs: Not on file  Physical Activity: Not on file  Stress: Not on file  Social Connections: Not on file  Intimate Partner Violence: Not on file    Outpatient Medications Prior to Visit  Medication Sig Dispense Refill   acetaminophen (TYLENOL) 325 MG tablet Take 2 tablets (650 mg total) by mouth every 6 (six) hours as  needed for mild pain or headache.     aspirin EC 81 MG tablet Take 1 tablet (81 mg total) by mouth daily.     bisoprolol (ZEBETA) 5 MG tablet Take 0.5 tablets (2.5 mg total) by mouth daily. 45 tablet 3   Bromfenac Sodium (PROLENSA) 0.07 % SOLN Apply 1 drop to eye daily. Right eye     Carbidopa-Levodopa ER (SINEMET CR) 25-100 MG tablet controlled release Take 2 tablets by mouth 3 (three) times daily. 540 tablet 1   docusate sodium (COLACE) 100 MG capsule Take 1 capsule (100 mg total) by mouth daily. 10 capsule 0   furosemide (LASIX) 40 MG tablet TAKE 1 TABLET BY MOUTH DAILY. 90 tablet 1   HYDROcodone-acetaminophen (NORCO/VICODIN) 5-325 MG tablet Take 1 tablet by mouth every 6 (six) hours as needed for moderate pain. 30 tablet 0   lisinopril (ZESTRIL) 10 MG tablet TAKE 1 TABLET BY MOUTH DAILY. 90 tablet 1   lovastatin (MEVACOR) 20 MG tablet Take 1  tablet (20 mg total) by mouth at bedtime. 90 tablet 0   melatonin 3 MG TABS tablet Take 1 tablet (3 mg total) by mouth at bedtime. NEEDS OFFICE VISIT FOR ANY FURTHER REFILLS 30 tablet 0   moxifloxacin (VIGAMOX) 0.5 % ophthalmic solution Place 1 drop into the right eye in the morning, at noon, in the evening, and at bedtime.     nitrofurantoin, macrocrystal-monohydrate, (MACROBID) 100 MG capsule Take 1 capsule (100 mg total) by mouth 2 (two) times daily. 20 capsule 0   potassium chloride (KLOR-CON) 10 MEQ tablet Take 1 tablet (10 mEq total) by mouth daily. 90 tablet 3   sertraline (ZOLOFT) 100 MG tablet TAKE 1 TABLET (100 MG TOTAL) BY MOUTH DAILY. 30 tablet 0   LORazepam (ATIVAN) 0.5 MG tablet TAKE 1/2 TABLET BY MOUTH EVERY 8 HOURS AS NEEDED FOR ANXIETY. 20 tablet 0   ciprofloxacin (CIPRO) 250 MG tablet Take 1 tablet (250 mg total) by mouth 2 (two) times daily. 6 tablet 0   nitrofurantoin (MACRODANTIN) 50 MG capsule TAKE 1 CAPSULE (50 MG TOTAL) BY MOUTH AT BEDTIME. 30 capsule 2   sulfamethoxazole-trimethoprim (BACTRIM DS) 800-160 MG tablet Take 1 tablet by mouth 2 (two) times daily. 14 tablet 0   No facility-administered medications prior to visit.    Allergies  Allergen Reactions   Iohexol Rash     Code: RASH, Desc: PATIENT STATES SHE IS ALLERGIC TO IV DYE. 20 YRS AGO SHE HAD A REACTION AT [facility], PT WAS GIVEN BENADRYL. 07/13/06/RM, Onset Date: [id number]    Penicillins Hives    ROS Review of Systems  Constitutional:  Negative for appetite change, diaphoresis, fatigue and unexpected weight change.  Eyes:  Negative for pain, redness and visual disturbance.  Respiratory:  Negative for cough, chest tightness, shortness of breath and wheezing.   Cardiovascular:  Negative for chest pain, palpitations and leg swelling.  Endocrine: Negative for cold intolerance, heat intolerance, polydipsia, polyphagia and polyuria.  Genitourinary:  Negative for difficulty urinating, dysuria and frequency.   Neurological:  Negative for dizziness, light-headedness, numbness and headaches.  Psychiatric/Behavioral:  Positive for agitation, confusion and sleep disturbance. Negative for self-injury and suicidal ideas. The patient is nervous/anxious.      Objective:    Physical Exam Vitals and nursing note reviewed.  Constitutional:      Appearance: She is well-developed.  HENT:     Head: Normocephalic and atraumatic.  Eyes:     Conjunctiva/sclera: Conjunctivae normal.  Neck:  Thyroid: No thyromegaly.     Vascular: No carotid bruit or JVD.  Cardiovascular:     Rate and Rhythm: Normal rate and regular rhythm.     Heart sounds: Normal heart sounds. No murmur heard. Pulmonary:     Effort: Pulmonary effort is normal. No respiratory distress.     Breath sounds: Normal breath sounds. No wheezing or rales.  Chest:     Chest wall: No tenderness.  Musculoskeletal:     Cervical back: Normal range of motion and neck supple.  Neurological:     Mental Status: She is disoriented.     Coordination: Coordination abnormal.    BP (!) 83/48   Pulse 71   Resp 12   LMP 07/19/1988   SpO2 91%  Wt Readings from Last 3 Encounters:  12/16/20 132 lb 4.4 oz (60 kg)  12/11/20 132 lb 3.2 oz (60 kg)  10/09/20 128 lb 12.8 oz (58.4 kg)     Health Maintenance Due  Topic Date Due   COVID-19 Vaccine (1) Never done   Zoster Vaccines- Shingrix (1 of 2) Never done   MAMMOGRAM  01/05/2018    There are no preventive care reminders to display for this patient.  Lab Results  Component Value Date   TSH 1.37 12/19/2020   Lab Results  Component Value Date   WBC 5.6 04/16/2021   HGB 11.1 (L) 04/16/2021   HCT 33.6 (L) 04/16/2021   MCV 95.0 04/16/2021   PLT 283.0 04/16/2021   Lab Results  Component Value Date   NA 139 04/16/2021   K 3.6 04/16/2021   CO2 29 04/16/2021   GLUCOSE 106 (H) 04/16/2021   BUN 26 (H) 04/16/2021   CREATININE 1.16 04/16/2021   BILITOT 0.5 04/16/2021   ALKPHOS 65 04/16/2021    AST 10 04/16/2021   ALT 3 04/16/2021   PROT 6.8 04/16/2021   ALBUMIN 4.4 04/16/2021   CALCIUM 9.0 04/16/2021   ANIONGAP 10 05/30/2020   GFR 43.88 (L) 04/16/2021   Lab Results  Component Value Date   CHOL 188 04/16/2021   Lab Results  Component Value Date   HDL 54.80 04/16/2021   Lab Results  Component Value Date   LDLCALC 119 (H) 04/16/2021   Lab Results  Component Value Date   TRIG 69.0 04/16/2021   Lab Results  Component Value Date   CHOLHDL 3 04/16/2021   Lab Results  Component Value Date   HGBA1C 5.4 01/31/2019   Ua is normal    Assessment & Plan:   Problem List Items Addressed This Visit       Unprioritized   Hyperlipidemia   Relevant Orders   Comprehensive metabolic panel (Completed)   CBC with Differential/Platelet (Completed)   Lipid panel (Completed)   Anxiety and depression    con't meds Ativan tid prn       Relevant Medications   LORazepam (ATIVAN) 0.5 MG tablet   Parkinson disease ([place])    Per neuro       Anxiety    Refill ativan       Relevant Medications   LORazepam (ATIVAN) 0.5 MG tablet   Essential hypertension    .sbhtn      Urinary frequency - Primary    Culture pending  ua neg       Relevant Orders   POCT Urinalysis Dipstick (Automated) (Completed)   Urine Culture   Other Visit Diagnoses     Need for influenza vaccination       Relevant  Orders   Flu Vaccine QUAD High Dose(Fluad) (Completed)       Meds ordered this encounter  Medications   LORazepam (ATIVAN) 0.5 MG tablet    Sig: TAKE 1/2 TABLET BY MOUTH EVERY 8 HOURS AS NEEDED FOR ANXIETY.    Dispense:  45 tablet    Refill:  0    Follow-up: Return in about 3 months (around 07/16/2021), or if symptoms worsen or fail to improve.    Ann Held, DO

## 2021-04-17 DIAGNOSIS — Z9181 History of falling: Secondary | ICD-10-CM | POA: Diagnosis not present

## 2021-04-17 DIAGNOSIS — R488 Other symbolic dysfunctions: Secondary | ICD-10-CM | POA: Diagnosis not present

## 2021-04-17 DIAGNOSIS — R41841 Cognitive communication deficit: Secondary | ICD-10-CM | POA: Diagnosis not present

## 2021-04-17 LAB — LIPID PANEL
Cholesterol: 188 mg/dL (ref 0–200)
HDL: 54.8 mg/dL (ref >39.00)
LDL Cholesterol: 119 mg/dL — ABNORMAL HIGH (ref 0–99)
NonHDL: 132.88
Total CHOL/HDL Ratio: 3
Triglycerides: 69 mg/dL (ref 0.0–149.0)
VLDL: 13.8 mg/dL (ref 0.0–40.0)

## 2021-04-17 LAB — CBC WITH DIFFERENTIAL/PLATELET
Basophils Absolute: 0.1 10*3/uL (ref 0.0–0.1)
Basophils Relative: 0.9 % (ref 0.0–3.0)
Eosinophils Absolute: 0.1 10*3/uL (ref 0.0–0.7)
Eosinophils Relative: 1.1 % (ref 0.0–5.0)
HCT: 33.6 % — ABNORMAL LOW (ref 36.0–46.0)
Hemoglobin: 11.1 g/dL — ABNORMAL LOW (ref 12.0–15.0)
Lymphocytes Relative: 26.3 % (ref 12.0–46.0)
Lymphs Abs: 1.5 10*3/uL (ref 0.7–4.0)
MCHC: 33.1 g/dL (ref 30.0–36.0)
MCV: 95 fl (ref 78.0–100.0)
Monocytes Absolute: 0.5 10*3/uL (ref 0.1–1.0)
Monocytes Relative: 8.2 % (ref 3.0–12.0)
Neutro Abs: 3.5 10*3/uL (ref 1.4–7.7)
Neutrophils Relative %: 63.5 % (ref 43.0–77.0)
Platelets: 283 10*3/uL (ref 150.0–400.0)
RBC: 3.54 Mil/uL — ABNORMAL LOW (ref 3.87–5.11)
RDW: 13.8 % (ref 11.5–15.5)
WBC: 5.6 10*3/uL (ref 4.0–10.5)

## 2021-04-17 LAB — COMPREHENSIVE METABOLIC PANEL
ALT: 3 U/L (ref 0–35)
AST: 10 U/L (ref 0–37)
Albumin: 4.4 g/dL (ref 3.5–5.2)
Alkaline Phosphatase: 65 U/L (ref 39–117)
BUN: 26 mg/dL — ABNORMAL HIGH (ref 6–23)
CO2: 29 mEq/L (ref 19–32)
Calcium: 9 mg/dL (ref 8.4–10.5)
Chloride: 99 mEq/L (ref 96–112)
Creatinine, Ser: 1.16 mg/dL (ref 0.40–1.20)
GFR: 43.88 mL/min — ABNORMAL LOW (ref >60.00)
Glucose, Bld: 106 mg/dL — ABNORMAL HIGH (ref 70–99)
Potassium: 3.6 mEq/L (ref 3.5–5.1)
Sodium: 139 mEq/L (ref 135–145)
Total Bilirubin: 0.5 mg/dL (ref 0.2–1.2)
Total Protein: 6.8 g/dL (ref 6.0–8.3)

## 2021-04-18 ENCOUNTER — Encounter: Payer: Self-pay | Admitting: Family Medicine

## 2021-04-18 LAB — URINE CULTURE
MICRO NUMBER:: 12440067
SPECIMEN QUALITY:: ADEQUATE

## 2021-04-18 NOTE — Assessment & Plan Note (Signed)
Refill ativan

## 2021-04-18 NOTE — Assessment & Plan Note (Signed)
Culture pending  ua neg

## 2021-04-18 NOTE — Assessment & Plan Note (Signed)
.  sbhtn 

## 2021-04-19 ENCOUNTER — Other Ambulatory Visit: Payer: Self-pay | Admitting: Family Medicine

## 2021-04-19 DIAGNOSIS — N390 Urinary tract infection, site not specified: Secondary | ICD-10-CM

## 2021-04-19 MED ORDER — CIPROFLOXACIN HCL 250 MG PO TABS: 250.0000 mg | ORAL_TABLET | Freq: Two times a day (BID) | ORAL | 0 refills | Status: AC

## 2021-04-20 ENCOUNTER — Other Ambulatory Visit: Payer: Self-pay

## 2021-04-21 DIAGNOSIS — Z9181 History of falling: Secondary | ICD-10-CM | POA: Diagnosis not present

## 2021-04-21 DIAGNOSIS — R488 Other symbolic dysfunctions: Secondary | ICD-10-CM | POA: Diagnosis not present

## 2021-04-21 DIAGNOSIS — R41841 Cognitive communication deficit: Secondary | ICD-10-CM | POA: Diagnosis not present

## 2021-04-22 DIAGNOSIS — R41841 Cognitive communication deficit: Secondary | ICD-10-CM | POA: Diagnosis not present

## 2021-04-22 DIAGNOSIS — R488 Other symbolic dysfunctions: Secondary | ICD-10-CM | POA: Diagnosis not present

## 2021-04-22 DIAGNOSIS — Z9181 History of falling: Secondary | ICD-10-CM | POA: Diagnosis not present

## 2021-04-24 ENCOUNTER — Other Ambulatory Visit: Payer: Self-pay | Admitting: Family Medicine

## 2021-04-24 DIAGNOSIS — N390 Urinary tract infection, site not specified: Secondary | ICD-10-CM

## 2021-04-28 DIAGNOSIS — Z9181 History of falling: Secondary | ICD-10-CM | POA: Diagnosis not present

## 2021-04-28 DIAGNOSIS — R41841 Cognitive communication deficit: Secondary | ICD-10-CM | POA: Diagnosis not present

## 2021-04-28 DIAGNOSIS — R488 Other symbolic dysfunctions: Secondary | ICD-10-CM | POA: Diagnosis not present

## 2021-05-01 DIAGNOSIS — Z9181 History of falling: Secondary | ICD-10-CM | POA: Diagnosis not present

## 2021-05-01 DIAGNOSIS — R488 Other symbolic dysfunctions: Secondary | ICD-10-CM | POA: Diagnosis not present

## 2021-05-01 DIAGNOSIS — R41841 Cognitive communication deficit: Secondary | ICD-10-CM | POA: Diagnosis not present

## 2021-05-05 DIAGNOSIS — Z9181 History of falling: Secondary | ICD-10-CM | POA: Diagnosis not present

## 2021-05-05 DIAGNOSIS — R41841 Cognitive communication deficit: Secondary | ICD-10-CM | POA: Diagnosis not present

## 2021-05-05 DIAGNOSIS — R488 Other symbolic dysfunctions: Secondary | ICD-10-CM | POA: Diagnosis not present

## 2021-05-06 ENCOUNTER — Other Ambulatory Visit: Payer: Self-pay | Admitting: Family Medicine

## 2021-05-06 DIAGNOSIS — R41841 Cognitive communication deficit: Secondary | ICD-10-CM | POA: Diagnosis not present

## 2021-05-06 DIAGNOSIS — I10 Essential (primary) hypertension: Secondary | ICD-10-CM

## 2021-05-06 DIAGNOSIS — Z9181 History of falling: Secondary | ICD-10-CM | POA: Diagnosis not present

## 2021-05-06 DIAGNOSIS — R488 Other symbolic dysfunctions: Secondary | ICD-10-CM | POA: Diagnosis not present

## 2021-05-08 DIAGNOSIS — R41841 Cognitive communication deficit: Secondary | ICD-10-CM | POA: Diagnosis not present

## 2021-05-08 DIAGNOSIS — Z9181 History of falling: Secondary | ICD-10-CM | POA: Diagnosis not present

## 2021-05-08 DIAGNOSIS — R488 Other symbolic dysfunctions: Secondary | ICD-10-CM | POA: Diagnosis not present

## 2021-05-11 ENCOUNTER — Other Ambulatory Visit: Payer: Self-pay | Admitting: Family Medicine

## 2021-05-11 DIAGNOSIS — I10 Essential (primary) hypertension: Secondary | ICD-10-CM

## 2021-05-12 DIAGNOSIS — R41841 Cognitive communication deficit: Secondary | ICD-10-CM | POA: Diagnosis not present

## 2021-05-12 DIAGNOSIS — Z9181 History of falling: Secondary | ICD-10-CM | POA: Diagnosis not present

## 2021-05-12 DIAGNOSIS — R488 Other symbolic dysfunctions: Secondary | ICD-10-CM | POA: Diagnosis not present

## 2021-05-13 DIAGNOSIS — Z9181 History of falling: Secondary | ICD-10-CM | POA: Diagnosis not present

## 2021-05-13 DIAGNOSIS — R41841 Cognitive communication deficit: Secondary | ICD-10-CM | POA: Diagnosis not present

## 2021-05-13 DIAGNOSIS — R488 Other symbolic dysfunctions: Secondary | ICD-10-CM | POA: Diagnosis not present

## 2021-05-14 ENCOUNTER — Telehealth: Payer: Self-pay | Admitting: Family Medicine

## 2021-05-14 DIAGNOSIS — Z9181 History of falling: Secondary | ICD-10-CM | POA: Diagnosis not present

## 2021-05-14 DIAGNOSIS — R41841 Cognitive communication deficit: Secondary | ICD-10-CM | POA: Diagnosis not present

## 2021-05-14 DIAGNOSIS — R488 Other symbolic dysfunctions: Secondary | ICD-10-CM | POA: Diagnosis not present

## 2021-05-18 ENCOUNTER — Other Ambulatory Visit: Payer: Self-pay

## 2021-05-18 ENCOUNTER — Other Ambulatory Visit (HOSPITAL_BASED_OUTPATIENT_CLINIC_OR_DEPARTMENT_OTHER): Payer: Self-pay

## 2021-05-18 ENCOUNTER — Other Ambulatory Visit: Payer: Self-pay | Admitting: Family Medicine

## 2021-05-18 ENCOUNTER — Ambulatory Visit (INDEPENDENT_AMBULATORY_CARE_PROVIDER_SITE_OTHER): Payer: Medicare Other | Admitting: Medical

## 2021-05-18 VITALS — BP 91/46 | HR 62 | Temp 98.0°F | Resp 18 | Ht 62.0 in | Wt 121.0 lb

## 2021-05-18 DIAGNOSIS — R350 Frequency of micturition: Secondary | ICD-10-CM

## 2021-05-18 DIAGNOSIS — R410 Disorientation, unspecified: Secondary | ICD-10-CM | POA: Diagnosis not present

## 2021-05-18 DIAGNOSIS — N390 Urinary tract infection, site not specified: Secondary | ICD-10-CM | POA: Diagnosis not present

## 2021-05-18 DIAGNOSIS — E785 Hyperlipidemia, unspecified: Secondary | ICD-10-CM

## 2021-05-18 LAB — POCT URINALYSIS DIPSTICK
Bilirubin, UA: NEGATIVE
Blood, UA: NEGATIVE
Glucose, UA: NEGATIVE
Ketones, UA: NEGATIVE
Nitrite, UA: NEGATIVE
Protein, UA: POSITIVE — AB
Spec Grav, UA: 1.015 (ref 1.010–1.025)
Urobilinogen, UA: 0.2 E.U./dL
pH, UA: 5 (ref 5.0–8.0)

## 2021-05-18 MED ORDER — CIPROFLOXACIN HCL 250 MG PO TABS
250.0000 mg | ORAL_TABLET | Freq: Two times a day (BID) | ORAL | 0 refills | Status: DC
  Filled 2021-05-18: qty 10, 5d supply, fill #0

## 2021-05-18 NOTE — Patient Instructions (Addendum)
History of 2 UTIs since the summer.  Now having recurrent frequency of urination and there is some concern of possible recurrent UTI.  The urine POCT results do not look very suspicious.  We will go ahead and send out culture.  Caregivers note that family wants me to go ahead and prescribe medication downstairs.  I did go ahead and send prescription of Cipro downstairs but advised not to give antibiotic to patient yet as I  want to review the urine culture first.  Caregivers expressed understanding and want them to pass the message along to family members.  Some recent episodes of altered mental status/memory changes described.  Decided to go ahead and do Mini-Mental status exam.  If her urine culture comes back positive we will keep this in mind as urinary tract infection could affect patient's score.  After culture results back will decide whether to refer for dementia evaluation.  Note patient has been seen by neurologist we will Parkinson's.  She is also on medication for this.  We will also go ahead and get CBC and CMP today.  Electrolyte abnormalities could be a contributing factor to mental status changes.  Follow-up date to be determined after lab review.

## 2021-05-18 NOTE — Progress Notes (Signed)
Subjective:    Patient ID: Elizabeth Mccullough, female    DOB: [birth year], 80 y.o.   MRN: [id number]  HPI Pt in for possible uti.  She states not pain on urination. Pt last urine culture grew out bacteria. Pt did have cipro for over 100,000 bacteria. Some frequency of urination. No back pain, no fever, no chills. Reported urine looks concentrated. No odor reported.  She will often get confused with uti.   Caregivers are concerned that she might have dementia.   Recently she has been hallucinating for at least a month, putting creamer in the water. Putting dressing on food instead of salad. This has been going for 2 months.  In august she had a uti. She was given macrobid back then.    Pt family and and care takers concerned for dementia. Pt does see neurologist and she is on carbidopa-levodopa.     Review of Systems  Constitutional:  Negative for chills, fatigue and fever.  Respiratory:  Negative for cough, chest tightness and wheezing.   Cardiovascular:  Negative for chest pain and palpitations.  Gastrointestinal:  Negative for abdominal pain, nausea and vomiting.  Genitourinary:  Positive for frequency. Negative for dysuria and flank pain.  Musculoskeletal:  Negative for back pain and neck pain.  Skin:  Negative for rash.  Neurological:  Negative for dizziness, speech difficulty, weakness, numbness and headaches.       See hpi.  Hematological:  Negative for adenopathy. Does not bruise/bleed easily.  Psychiatric/Behavioral:  Negative for behavioral problems and confusion.     Past Medical History:  Diagnosis Date   AAA (abdominal aortic aneurysm)    Carotid artery disease ([place]) 01/12/2017   Carotid US 6/18: Bilat < 50%; repeat in 12/2000   Chronic combined systolic and diastolic CHF (congestive heart failure) ([place]) 03/01/2017   NICM // [place] 8/18: normal coronary arteries /  Echo 8/18: EF 20-25, mod AI, mod MR, PASP 32 // Echo 1/19: EF 55-60, no RWMA, Gr 1 DD, mod AI, mild  MR, PASP 32   Hyperlipidemia    Parkinson's disease ([place])      Social History   Socioeconomic History   Marital status: Widowed    Spouse name: Not on file   Number of children: 4   Years of education: Not on file   Highest education level: 12th grade  Occupational History   Occupation: retired  Tobacco Use   Smoking status: Never   Smokeless tobacco: Never  Vaping Use   Vaping Use: Never used  Substance and Sexual Activity   Alcohol use: Yes    Comment: social wine    Drug use: No   Sexual activity: Not Currently    Partners: Male  Other Topics Concern   Not on file  Social History Narrative   Exercise-- no   Pt lives with daughter [name] at home   Social Determinants of Health   Financial Resource Strain: Not on file  Food Insecurity: Not on file  Transportation Needs: Not on file  Physical Activity: Not on file  Stress: Not on file  Social Connections: Not on file  Intimate Partner Violence: Not on file    Past Surgical History:  Procedure Laterality Date   BREAST CYST ASPIRATION  1965   Right Breast   HAMMER TOE SURGERY  04/10/02   Left Toe   KNEE SURGERY Right 10/17/15   meniscus repair   NASAL SEPTUM SURGERY  1980   RIGHT/LEFT HEART CATH AND CORONARY ANGIOGRAPHY  N/A 03/03/2017   Procedure: RIGHT/LEFT HEART CATH AND CORONARY ANGIOGRAPHY;  Surgeon: Nelva Bush, MD;  Location: [facility];  Service: Cardiovascular;  Laterality: N/A;   THORACIC AORTOGRAM N/A 03/03/2017   Procedure: Thoracic Aortogram;  Surgeon: Nelva Bush, MD;  Location: [facility];  Service: Cardiovascular;  Laterality: N/A;    Family History  Problem Relation Age of Onset   Heart disease Mother    Hyperlipidemia Sister    Hyperlipidemia Brother    Diabetes Sister    Hyperlipidemia Sister    Cancer Sister 88       colon   Cancer Sister        breast   Stroke Brother    Hypertension Brother    Breast cancer Other    Colon cancer Other    Prostate cancer  Son    Diabetes Daughter    Stomach cancer Daughter    Healthy Daughter     Allergies  Allergen Reactions   Iohexol Rash     Code: RASH, Desc: PATIENT STATES SHE IS ALLERGIC TO IV DYE. 20 YRS AGO SHE HAD A REACTION AT [facility], PT WAS GIVEN BENADRYL. 07/13/06/RM, Onset Date: [id number]    Penicillins Hives    Current Outpatient Medications on File Prior to Visit  Medication Sig Dispense Refill   acetaminophen (TYLENOL) 325 MG tablet Take 2 tablets (650 mg total) by mouth every 6 (six) hours as needed for mild pain or headache.     aspirin EC 81 MG tablet Take 1 tablet (81 mg total) by mouth daily.     bisoprolol (ZEBETA) 5 MG tablet Take 0.5 tablets (2.5 mg total) by mouth daily. 45 tablet 3   Bromfenac Sodium (PROLENSA) 0.07 % SOLN Apply 1 drop to eye daily. Right eye     Carbidopa-Levodopa ER (SINEMET CR) 25-100 MG tablet controlled release Take 2 tablets by mouth 3 (three) times daily. 540 tablet 1   docusate sodium (COLACE) 100 MG capsule Take 1 capsule (100 mg total) by mouth daily. 10 capsule 0   furosemide (LASIX) 40 MG tablet TAKE 1 TABLET BY MOUTH DAILY. 90 tablet 1   HYDROcodone-acetaminophen (NORCO/VICODIN) 5-325 MG tablet Take 1 tablet by mouth every 6 (six) hours as needed for moderate pain. 30 tablet 0   lisinopril (ZESTRIL) 10 MG tablet TAKE 1 TABLET BY MOUTH DAILY. 90 tablet 1   LORazepam (ATIVAN) 0.5 MG tablet TAKE 1/2 TABLET BY MOUTH EVERY 8 HOURS AS NEEDED FOR ANXIETY. 45 tablet 0   melatonin 3 MG TABS tablet Take 1 tablet (3 mg total) by mouth at bedtime. NEEDS OFFICE VISIT FOR ANY FURTHER REFILLS 30 tablet 0   moxifloxacin (VIGAMOX) 0.5 % ophthalmic solution Place 1 drop into the right eye in the morning, at noon, in the evening, and at bedtime.     nitrofurantoin, macrocrystal-monohydrate, (MACROBID) 100 MG capsule Take 1 capsule (100 mg total) by mouth 2 (two) times daily. 20 capsule 0   potassium chloride (KLOR-CON) 10 MEQ tablet Take 1 tablet (10 mEq total) by  mouth daily. 90 tablet 3   sertraline (ZOLOFT) 100 MG tablet TAKE 1 TABLET (100 MG TOTAL) BY MOUTH DAILY. 30 tablet 0   No current facility-administered medications on file prior to visit.    BP (!) 91/46   Pulse 62   Temp 98 F (36.7 C)   Resp 18   Ht 5\' 2"  (1.575 m)   Wt 121 lb (54.9 kg)   LMP 07/19/1988  SpO2 96%   BMI 22.13 kg/m       Objective:   Physical Exam  General- No acute distress. Pleasant patient. But seems easily confused. Speaks very quietly.  She does interact and tries to answer my questions today.  She knows she is in a medical facility but cannot specify exactly when I asked her.  Also she is having trouble remembering who her PCP is. Neck- Full range of motion, no jvd Lungs- Clear, even and unlabored. Heart- regular rate and rhythm. Neurologic- CNII- XII grossly intact.  Abdomen- soft, nt, nd, +bs. No rebound or guaring. Back- no cva tenderness.        Assessment & Plan:   Patient Instructions  History of 2 UTIs since the summer.  Now having recurrent frequency of urination and there is some concern of possible recurrent UTI.  The urine POCT results do not look very suspicious.  We will go ahead and send out culture.  Caregivers note that family wants me to go ahead and prescribe medication downstairs.  I did go ahead and send prescription of Cipro downstairs but advised not to give antibiotic to patient yet as I  want to review the urine culture first.  Caregivers expressed understanding and want them to pass the message along to family members.  Some recent episodes of altered mental status/memory changes described.  Decided to go ahead and do Mini-Mental status exam.  If her urine culture comes back positive we will keep this in mind as urinary tract infection could affect patient's score.  After culture results back will decide whether to refer for dementia evaluation.  Note patient has been seen by neurologist we will Parkinson's.  She is also on  medication for this.  We will also go ahead and get CBC and CMP today.  Electrolyte abnormalities could be a contributing factor to mental status changes.  Follow-up date to be determined after lab review.   Mackie Pai, PA-C

## 2021-05-19 LAB — CBC WITH DIFFERENTIAL/PLATELET
Basophils Absolute: 0 10*3/uL (ref 0.0–0.1)
Basophils Relative: 0.6 % (ref 0.0–3.0)
Eosinophils Absolute: 0.1 10*3/uL (ref 0.0–0.7)
Eosinophils Relative: 1.7 % (ref 0.0–5.0)
HCT: 33.1 % — ABNORMAL LOW (ref 36.0–46.0)
Hemoglobin: 11 g/dL — ABNORMAL LOW (ref 12.0–15.0)
Lymphocytes Relative: 29.7 % (ref 12.0–46.0)
Lymphs Abs: 1.6 10*3/uL (ref 0.7–4.0)
MCHC: 33.3 g/dL (ref 30.0–36.0)
MCV: 95.6 fl (ref 78.0–100.0)
Monocytes Absolute: 0.5 10*3/uL (ref 0.1–1.0)
Monocytes Relative: 9.7 % (ref 3.0–12.0)
Neutro Abs: 3.2 10*3/uL (ref 1.4–7.7)
Neutrophils Relative %: 58.3 % (ref 43.0–77.0)
Platelets: 280 10*3/uL (ref 150.0–400.0)
RBC: 3.46 Mil/uL — ABNORMAL LOW (ref 3.87–5.11)
RDW: 13.9 % (ref 11.5–15.5)
WBC: 5.4 10*3/uL (ref 4.0–10.5)

## 2021-05-19 LAB — COMPREHENSIVE METABOLIC PANEL
ALT: 2 U/L (ref 0–35)
AST: 11 U/L (ref 0–37)
Albumin: 4.3 g/dL (ref 3.5–5.2)
Alkaline Phosphatase: 65 U/L (ref 39–117)
BUN: 25 mg/dL — ABNORMAL HIGH (ref 6–23)
CO2: 31 mEq/L (ref 19–32)
Calcium: 9 mg/dL (ref 8.4–10.5)
Chloride: 102 mEq/L (ref 96–112)
Creatinine, Ser: 1.24 mg/dL — ABNORMAL HIGH (ref 0.40–1.20)
GFR: 40.48 mL/min — ABNORMAL LOW (ref >60.00)
Glucose, Bld: 88 mg/dL (ref 70–99)
Potassium: 3.9 mEq/L (ref 3.5–5.1)
Sodium: 140 mEq/L (ref 135–145)
Total Bilirubin: 0.4 mg/dL (ref 0.2–1.2)
Total Protein: 6.5 g/dL (ref 6.0–8.3)

## 2021-05-20 ENCOUNTER — Telehealth: Payer: Self-pay | Admitting: Medical

## 2021-05-20 ENCOUNTER — Other Ambulatory Visit: Payer: Self-pay | Admitting: Family Medicine

## 2021-05-20 DIAGNOSIS — Z9181 History of falling: Secondary | ICD-10-CM | POA: Diagnosis not present

## 2021-05-20 DIAGNOSIS — R41841 Cognitive communication deficit: Secondary | ICD-10-CM | POA: Diagnosis not present

## 2021-05-20 DIAGNOSIS — N390 Urinary tract infection, site not specified: Secondary | ICD-10-CM

## 2021-05-20 DIAGNOSIS — F419 Anxiety disorder, unspecified: Secondary | ICD-10-CM

## 2021-05-20 DIAGNOSIS — R488 Other symbolic dysfunctions: Secondary | ICD-10-CM | POA: Diagnosis not present

## 2021-05-20 LAB — URINE CULTURE
MICRO NUMBER:: 12572305
SPECIMEN QUALITY:: ADEQUATE

## 2021-05-20 MED ORDER — CIPROFLOXACIN HCL 250 MG PO TABS
250.0000 mg | ORAL_TABLET | Freq: Two times a day (BID) | ORAL | 0 refills | Status: DC
  Filled 2021-05-20: qty 6, 3d supply, fill #0

## 2021-05-20 NOTE — Addendum Note (Signed)
Addended by: Anabel Halon on: 05/20/2021 09:21 PM   Modules accepted: Orders

## 2021-05-20 NOTE — Telephone Encounter (Signed)
error 

## 2021-05-20 NOTE — Telephone Encounter (Signed)
Future urine culture placed.

## 2021-05-21 ENCOUNTER — Telehealth: Payer: Self-pay | Admitting: Family Medicine

## 2021-05-21 ENCOUNTER — Other Ambulatory Visit (HOSPITAL_BASED_OUTPATIENT_CLINIC_OR_DEPARTMENT_OTHER): Payer: Self-pay

## 2021-05-21 MED ORDER — CIPROFLOXACIN HCL 250 MG PO TABS: 250.0000 mg | ORAL_TABLET | Freq: Two times a day (BID) | ORAL | 0 refills | Status: AC

## 2021-05-21 NOTE — Telephone Encounter (Signed)
Pt's daughter called and stated she is confused as to why her mom is still having problems with a uti, she stated when she has a uti it effects her mental state and she feels very disoriented. [name], her daughter stated she would like a call about this to talk about next steps she can be reached at [phone]

## 2021-05-21 NOTE — Addendum Note (Signed)
Addended by: Jeronimo Greaves on: 05/21/2021 09:11 AM   Modules accepted: Orders

## 2021-05-22 ENCOUNTER — Other Ambulatory Visit: Payer: Self-pay

## 2021-05-22 DIAGNOSIS — R41841 Cognitive communication deficit: Secondary | ICD-10-CM | POA: Diagnosis not present

## 2021-05-22 DIAGNOSIS — N390 Urinary tract infection, site not specified: Secondary | ICD-10-CM

## 2021-05-22 DIAGNOSIS — Z9181 History of falling: Secondary | ICD-10-CM | POA: Diagnosis not present

## 2021-05-22 DIAGNOSIS — R488 Other symbolic dysfunctions: Secondary | ICD-10-CM | POA: Diagnosis not present

## 2021-05-22 NOTE — Telephone Encounter (Signed)
LVM and placed referral to Urology

## 2021-05-26 DIAGNOSIS — R488 Other symbolic dysfunctions: Secondary | ICD-10-CM | POA: Diagnosis not present

## 2021-05-26 DIAGNOSIS — Z9181 History of falling: Secondary | ICD-10-CM | POA: Diagnosis not present

## 2021-05-26 DIAGNOSIS — R41841 Cognitive communication deficit: Secondary | ICD-10-CM | POA: Diagnosis not present

## 2021-05-27 DIAGNOSIS — Z9181 History of falling: Secondary | ICD-10-CM | POA: Diagnosis not present

## 2021-05-27 DIAGNOSIS — R488 Other symbolic dysfunctions: Secondary | ICD-10-CM | POA: Diagnosis not present

## 2021-05-27 DIAGNOSIS — R41841 Cognitive communication deficit: Secondary | ICD-10-CM | POA: Diagnosis not present

## 2021-05-29 DIAGNOSIS — Z9181 History of falling: Secondary | ICD-10-CM | POA: Diagnosis not present

## 2021-05-29 DIAGNOSIS — R41841 Cognitive communication deficit: Secondary | ICD-10-CM | POA: Diagnosis not present

## 2021-05-29 DIAGNOSIS — R488 Other symbolic dysfunctions: Secondary | ICD-10-CM | POA: Diagnosis not present

## 2021-06-02 ENCOUNTER — Other Ambulatory Visit: Payer: Medicare Other

## 2021-06-02 ENCOUNTER — Other Ambulatory Visit: Payer: Self-pay | Admitting: *Deleted

## 2021-06-02 ENCOUNTER — Other Ambulatory Visit: Payer: Self-pay

## 2021-06-02 DIAGNOSIS — Z9181 History of falling: Secondary | ICD-10-CM | POA: Diagnosis not present

## 2021-06-02 DIAGNOSIS — R488 Other symbolic dysfunctions: Secondary | ICD-10-CM | POA: Diagnosis not present

## 2021-06-02 DIAGNOSIS — N390 Urinary tract infection, site not specified: Secondary | ICD-10-CM

## 2021-06-02 DIAGNOSIS — R41841 Cognitive communication deficit: Secondary | ICD-10-CM | POA: Diagnosis not present

## 2021-06-03 DIAGNOSIS — R488 Other symbolic dysfunctions: Secondary | ICD-10-CM | POA: Diagnosis not present

## 2021-06-03 DIAGNOSIS — Z9181 History of falling: Secondary | ICD-10-CM | POA: Diagnosis not present

## 2021-06-03 DIAGNOSIS — R41841 Cognitive communication deficit: Secondary | ICD-10-CM | POA: Diagnosis not present

## 2021-06-03 LAB — URINE CULTURE
MICRO NUMBER:: 12639141
Result:: NO GROWTH
SPECIMEN QUALITY:: ADEQUATE

## 2021-06-05 DIAGNOSIS — R488 Other symbolic dysfunctions: Secondary | ICD-10-CM | POA: Diagnosis not present

## 2021-06-05 DIAGNOSIS — R41841 Cognitive communication deficit: Secondary | ICD-10-CM | POA: Diagnosis not present

## 2021-06-05 DIAGNOSIS — Z9181 History of falling: Secondary | ICD-10-CM | POA: Diagnosis not present

## 2021-06-07 DIAGNOSIS — Z9181 History of falling: Secondary | ICD-10-CM | POA: Diagnosis not present

## 2021-06-07 DIAGNOSIS — R488 Other symbolic dysfunctions: Secondary | ICD-10-CM | POA: Diagnosis not present

## 2021-06-07 DIAGNOSIS — R41841 Cognitive communication deficit: Secondary | ICD-10-CM | POA: Diagnosis not present

## 2021-06-09 DIAGNOSIS — Z9181 History of falling: Secondary | ICD-10-CM | POA: Diagnosis not present

## 2021-06-09 DIAGNOSIS — R41841 Cognitive communication deficit: Secondary | ICD-10-CM | POA: Diagnosis not present

## 2021-06-09 DIAGNOSIS — R488 Other symbolic dysfunctions: Secondary | ICD-10-CM | POA: Diagnosis not present

## 2021-06-16 ENCOUNTER — Other Ambulatory Visit: Payer: Self-pay | Admitting: Family Medicine

## 2021-06-16 DIAGNOSIS — R41841 Cognitive communication deficit: Secondary | ICD-10-CM | POA: Diagnosis not present

## 2021-06-16 DIAGNOSIS — Z9181 History of falling: Secondary | ICD-10-CM | POA: Diagnosis not present

## 2021-06-16 DIAGNOSIS — R488 Other symbolic dysfunctions: Secondary | ICD-10-CM | POA: Diagnosis not present

## 2021-06-16 DIAGNOSIS — F419 Anxiety disorder, unspecified: Secondary | ICD-10-CM

## 2021-06-16 NOTE — Telephone Encounter (Signed)
Requesting: lorazepam 0.5mg  Contract: 07/26/2017 UDS: 07/26/2017 Last Visit: 04/16/2021 Next Visit: None Last Refill: 04/16/2021 #45 and 0RF  Please Advise

## 2021-06-17 DIAGNOSIS — R488 Other symbolic dysfunctions: Secondary | ICD-10-CM | POA: Diagnosis not present

## 2021-06-17 DIAGNOSIS — R41841 Cognitive communication deficit: Secondary | ICD-10-CM | POA: Diagnosis not present

## 2021-06-17 DIAGNOSIS — Z9181 History of falling: Secondary | ICD-10-CM | POA: Diagnosis not present

## 2021-06-19 ENCOUNTER — Other Ambulatory Visit: Payer: Self-pay | Admitting: Family Medicine

## 2021-06-19 DIAGNOSIS — Z9181 History of falling: Secondary | ICD-10-CM | POA: Diagnosis not present

## 2021-06-19 DIAGNOSIS — R488 Other symbolic dysfunctions: Secondary | ICD-10-CM | POA: Diagnosis not present

## 2021-06-19 DIAGNOSIS — R41841 Cognitive communication deficit: Secondary | ICD-10-CM | POA: Diagnosis not present

## 2021-06-23 DIAGNOSIS — Z9181 History of falling: Secondary | ICD-10-CM | POA: Diagnosis not present

## 2021-06-23 DIAGNOSIS — R41841 Cognitive communication deficit: Secondary | ICD-10-CM | POA: Diagnosis not present

## 2021-06-23 DIAGNOSIS — R488 Other symbolic dysfunctions: Secondary | ICD-10-CM | POA: Diagnosis not present

## 2021-06-24 DIAGNOSIS — R488 Other symbolic dysfunctions: Secondary | ICD-10-CM | POA: Diagnosis not present

## 2021-06-24 DIAGNOSIS — R41841 Cognitive communication deficit: Secondary | ICD-10-CM | POA: Diagnosis not present

## 2021-06-24 DIAGNOSIS — Z9181 History of falling: Secondary | ICD-10-CM | POA: Diagnosis not present

## 2021-07-06 ENCOUNTER — Other Ambulatory Visit: Payer: Self-pay | Admitting: Family Medicine

## 2021-07-09 ENCOUNTER — Telehealth: Payer: Self-pay | Admitting: Family Medicine

## 2021-07-09 ENCOUNTER — Other Ambulatory Visit: Payer: Self-pay | Admitting: Family Medicine

## 2021-07-09 DIAGNOSIS — F419 Anxiety disorder, unspecified: Secondary | ICD-10-CM

## 2021-07-09 DIAGNOSIS — G4700 Insomnia, unspecified: Secondary | ICD-10-CM

## 2021-07-09 MED ORDER — ALPRAZOLAM 0.25 MG PO TABS: 0.2500 mg | ORAL_TABLET | Freq: Three times a day (TID) | ORAL | 1 refills | Status: DC | PRN

## 2021-07-09 NOTE — Telephone Encounter (Signed)
Pt daughter stated Dr.Lowne wanted a reminder placed in chart to switch medication.

## 2021-07-09 NOTE — Telephone Encounter (Signed)
Reminder to switch medication

## 2021-07-10 NOTE — Telephone Encounter (Signed)
VM left 

## 2021-07-15 DIAGNOSIS — N952 Postmenopausal atrophic vaginitis: Secondary | ICD-10-CM | POA: Diagnosis not present

## 2021-07-15 DIAGNOSIS — N3020 Other chronic cystitis without hematuria: Secondary | ICD-10-CM | POA: Diagnosis not present

## 2021-07-15 DIAGNOSIS — N3000 Acute cystitis without hematuria: Secondary | ICD-10-CM | POA: Diagnosis not present

## 2021-07-21 ENCOUNTER — Other Ambulatory Visit: Payer: Self-pay | Admitting: Family Medicine

## 2021-08-14 ENCOUNTER — Other Ambulatory Visit: Payer: Self-pay | Admitting: Family Medicine

## 2021-08-14 DIAGNOSIS — E785 Hyperlipidemia, unspecified: Secondary | ICD-10-CM

## 2021-08-31 ENCOUNTER — Ambulatory Visit (INDEPENDENT_AMBULATORY_CARE_PROVIDER_SITE_OTHER): Payer: Medicare Other

## 2021-08-31 VITALS — Ht 62.0 in | Wt 121.0 lb

## 2021-08-31 DIAGNOSIS — Z0000 Encounter for general adult medical examination without abnormal findings: Secondary | ICD-10-CM

## 2021-08-31 NOTE — Patient Instructions (Signed)
Elizabeth Mccullough , Thank you for taking time to complete your Medicare Wellness Visit. I appreciate your ongoing commitment to your health goals. Please review the following plan we discussed and let me know if I can assist you in the future.   Screening recommendations/referrals: Colonoscopy: No longer required Mammogram: Declined today Bone Density: Declined today Recommended yearly ophthalmology/optometry visit for glaucoma screening and checkup Recommended yearly dental visit for hygiene and checkup  Vaccinations: Influenza vaccine: Up to date Pneumococcal vaccine: Up to date Tdap vaccine: Up to date Shingles vaccine: May obtain vaccine at your local pharmacy. Covid-19:Declined  Advanced directives: Please bring a copy of Living Will and/or Healthcare Power of Attorney for your chart.   Conditions/risks identified: See problem list  Next appointment: Follow up in one year for your annual wellness visit    Preventive Care 65 Years and Older, Female Preventive care refers to lifestyle choices and visits with your health care provider that can promote health and wellness. What does preventive care include? A yearly physical exam. This is also called an annual well check. Dental exams once or twice a year. Routine eye exams. Ask your health care provider how often you should have your eyes checked. Personal lifestyle choices, including: Daily care of your teeth and gums. Regular physical activity. Eating a healthy diet. Avoiding tobacco and drug use. Limiting alcohol use. Practicing safe sex. Taking low-dose aspirin every day. Taking vitamin and mineral supplements as recommended by your health care provider. What happens during an annual well check? The services and screenings done by your health care provider during your annual well check will depend on your age, overall health, lifestyle risk factors, and family history of disease. Counseling  Your health care provider  may ask you questions about your: Alcohol use. Tobacco use. Drug use. Emotional well-being. Home and relationship well-being. Sexual activity. Eating habits. History of falls. Memory and ability to understand (cognition). Work and work Statistician. Reproductive health. Screening  You may have the following tests or measurements: Height, weight, and BMI. Blood pressure. Lipid and cholesterol levels. These may be checked every 5 years, or more frequently if you are over 10 years old. Skin check. Lung cancer screening. You may have this screening every year starting at age 80 if you have a 30-pack-year history of smoking and currently smoke or have quit within the past 15 years. Fecal occult blood test (FOBT) of the stool. You may have this test every year starting at age 36. Flexible sigmoidoscopy or colonoscopy. You may have a sigmoidoscopy every 5 years or a colonoscopy every 10 years starting at age 38. Hepatitis C blood test. Hepatitis B blood test. Sexually transmitted disease (STD) testing. Diabetes screening. This is done by checking your blood sugar (glucose) after you have not eaten for a while (fasting). You may have this done every 1-3 years. Bone density scan. This is done to screen for osteoporosis. You may have this done starting at age 98. Mammogram. This may be done every 1-2 years. Talk to your health care provider about how often you should have regular mammograms. Talk with your health care provider about your test results, treatment options, and if necessary, the need for more tests. Vaccines  Your health care provider may recommend certain vaccines, such as: Influenza vaccine. This is recommended every year. Tetanus, diphtheria, and acellular pertussis (Tdap, Td) vaccine. You may need a Td booster every 10 years. Zoster vaccine. You may need this after age 7. Pneumococcal 13-valent conjugate (PCV13) vaccine.  One dose is recommended after age 58. Pneumococcal  polysaccharide (PPSV23) vaccine. One dose is recommended after age 34. Talk to your health care provider about which screenings and vaccines you need and how often you need them. This information is not intended to replace advice given to you by your health care provider. Make sure you discuss any questions you have with your health care provider. Document Released: 08/01/2015 Document Revised: 03/24/2016 Document Reviewed: 05/06/2015 Elsevier Interactive Patient Education  2017 [place] Prevention in the Home Falls can cause injuries. They can happen to people of all ages. There are many things you can do to make your home safe and to help prevent falls. What can I do on the outside of my home? Regularly fix the edges of walkways and driveways and fix any cracks. Remove anything that might make you trip as you walk through a door, such as a raised step or threshold. Trim any bushes or trees on the path to your home. Use bright outdoor lighting. Clear any walking paths of anything that might make someone trip, such as rocks or tools. Regularly check to see if handrails are loose or broken. Make sure that both sides of any steps have handrails. Any raised decks and porches should have guardrails on the edges. Have any leaves, snow, or ice cleared regularly. Use sand or salt on walking paths during winter. Clean up any spills in your garage right away. This includes oil or grease spills. What can I do in the bathroom? Use night lights. Install grab bars by the toilet and in the tub and shower. Do not use towel bars as grab bars. Use non-skid mats or decals in the tub or shower. If you need to sit down in the shower, use a plastic, non-slip stool. Keep the floor dry. Clean up any water that spills on the floor as soon as it happens. Remove soap buildup in the tub or shower regularly. Attach bath mats securely with double-sided non-slip rug tape. Do not have throw rugs and other  things on the floor that can make you trip. What can I do in the bedroom? Use night lights. Make sure that you have a light by your bed that is easy to reach. Do not use any sheets or blankets that are too big for your bed. They should not hang down onto the floor. Have a firm chair that has side arms. You can use this for support while you get dressed. Do not have throw rugs and other things on the floor that can make you trip. What can I do in the kitchen? Clean up any spills right away. Avoid walking on wet floors. Keep items that you use a lot in easy-to-reach places. If you need to reach something above you, use a strong step stool that has a grab bar. Keep electrical cords out of the way. Do not use floor polish or wax that makes floors slippery. If you must use wax, use non-skid floor wax. Do not have throw rugs and other things on the floor that can make you trip. What can I do with my stairs? Do not leave any items on the stairs. Make sure that there are handrails on both sides of the stairs and use them. Fix handrails that are broken or loose. Make sure that handrails are as long as the stairways. Check any carpeting to make sure that it is firmly attached to the stairs. Fix any carpet that is loose or  worn. Avoid having throw rugs at the top or bottom of the stairs. If you do have throw rugs, attach them to the floor with carpet tape. Make sure that you have a light switch at the top of the stairs and the bottom of the stairs. If you do not have them, ask someone to add them for you. What else can I do to help prevent falls? Wear shoes that: Do not have high heels. Have rubber bottoms. Are comfortable and fit you well. Are closed at the toe. Do not wear sandals. If you use a stepladder: Make sure that it is fully opened. Do not climb a closed stepladder. Make sure that both sides of the stepladder are locked into place. Ask someone to hold it for you, if possible. Clearly  mark and make sure that you can see: Any grab bars or handrails. First and last steps. Where the edge of each step is. Use tools that help you move around (mobility aids) if they are needed. These include: Canes. Walkers. Scooters. Crutches. Turn on the lights when you go into a dark area. Replace any light bulbs as soon as they burn out. Set up your furniture so you have a clear path. Avoid moving your furniture around. If any of your floors are uneven, fix them. If there are any pets around you, be aware of where they are. Review your medicines with your doctor. Some medicines can make you feel dizzy. This can increase your chance of falling. Ask your doctor what other things that you can do to help prevent falls. This information is not intended to replace advice given to you by your health care provider. Make sure you discuss any questions you have with your health care provider. Document Released: 05/01/2009 Document Revised: 12/11/2015 Document Reviewed: 08/09/2014 Elsevier Interactive Patient Education  2017 [employer].

## 2021-08-31 NOTE — Progress Notes (Signed)
Subjective:   Elizabeth Mccullough is a 81 y.o. female who presents for Medicare Annual (Subsequent) preventive examination.  I connected with Huldah today by telephone and verified that I am speaking with the correct person using two identifiers. Location patient: home Location provider: work Persons participating in the virtual visit: patient,daughter, Marine scientist.    I discussed the limitations, risks, security and privacy concerns of performing an evaluation and management service by telephone and the availability of in person appointments. I also discussed with the patient that there may be a patient responsible charge related to this service. The patient expressed understanding and verbally consented to this telephonic visit.    Interactive audio and video telecommunications were attempted between this provider and patient, however failed, due to patient having technical difficulties OR patient did not have access to video capability.  We continued and completed visit with audio only.  Some vital signs may be absent or patient reported.   Time Spent with patient on telephone encounter: 20 minutes   Review of Systems     Cardiac Risk Factors include: advanced age (>5men, >36 women);hypertension;dyslipidemia     Objective:    Today's Vitals   08/31/21 1540  Weight: 121 lb (54.9 kg)  Height: 5\' 2"  (1.575 m)   Body mass index is 22.13 kg/m.  Advanced Directives 08/31/2021 12/16/2020 05/17/2020 05/15/2020 05/14/2020 05/13/2020 12/18/2019  Does Patient Have a Medical Advance Directive? Yes Yes Yes Yes - Yes Yes  Type of Advance Directive Healthcare Power of Attorney Living will;Healthcare Power of [place] of [place] of [place] of [place];Living will [place];Living will  Does patient want to make changes to medical advance directive? - - No - Patient declined No - Patient declined - -  No - Patient declined  Copy of [place] in Chart? No - copy requested - No - copy requested No - copy requested No - copy requested No - copy requested, Physician notified No - copy requested  Would patient like information on creating a medical advance directive? - - - - - - -    Current Medications (verified) Outpatient Encounter Medications as of 08/31/2021  Medication Sig   acetaminophen (TYLENOL) 325 MG tablet Take 2 tablets (650 mg total) by mouth every 6 (six) hours as needed for mild pain or headache.   ALPRAZolam (XANAX) 0.25 MG tablet Take 1 tablet (0.25 mg total) by mouth 3 (three) times daily as needed for anxiety or sleep.   aspirin EC 81 MG tablet Take 1 tablet (81 mg total) by mouth daily.   bisoprolol (ZEBETA) 5 MG tablet Take 0.5 tablets (2.5 mg total) by mouth daily.   Bromfenac Sodium (PROLENSA) 0.07 % SOLN Apply 1 drop to eye daily. Right eye   Carbidopa-Levodopa ER (SINEMET CR) 25-100 MG tablet controlled release TAKE 2 TABLETS BY MOUTH 3 TIMES DAILY.   furosemide (LASIX) 40 MG tablet TAKE 1 TABLET BY MOUTH DAILY.   lisinopril (ZESTRIL) 10 MG tablet TAKE 1 TABLET BY MOUTH DAILY.   lovastatin (MEVACOR) 20 MG tablet TAKE 1 TABLET (20 MG TOTAL) BY MOUTH AT BEDTIME.   moxifloxacin (VIGAMOX) 0.5 % ophthalmic solution Place 1 drop into the right eye in the morning, at noon, in the evening, and at bedtime.   nitrofurantoin, macrocrystal-monohydrate, (MACROBID) 100 MG capsule Take 1 capsule (100 mg total) by mouth 2 (two) times daily.   potassium chloride (KLOR-CON) 10 MEQ tablet Take  1 tablet (10 mEq total) by mouth daily.   sertraline (ZOLOFT) 100 MG tablet Take 1 tablet (100 mg total) by mouth daily.   docusate sodium (COLACE) 100 MG capsule Take 1 capsule (100 mg total) by mouth daily.   HYDROcodone-acetaminophen (NORCO/VICODIN) 5-325 MG tablet Take 1 tablet by mouth every 6 (six) hours as needed for moderate pain. (Patient not taking: Reported on 08/31/2021)    melatonin 3 MG TABS tablet Take 1 tablet (3 mg total) by mouth at bedtime. NEEDS OFFICE VISIT FOR ANY FURTHER REFILLS   No facility-administered encounter medications on file as of 08/31/2021.    Allergies (verified) Iohexol and Penicillins   History: Past Medical History:  Diagnosis Date   AAA (abdominal aortic aneurysm)    Carotid artery disease ([place]) 01/12/2017   Carotid US 6/18: Bilat < 50%; repeat in 12/2000   Chronic combined systolic and diastolic CHF (congestive heart failure) ([place]) 03/01/2017   NICM // [place] 8/18: normal coronary arteries /  Echo 8/18: EF 20-25, mod AI, mod MR, PASP 32 // Echo 1/19: EF 55-60, no RWMA, Gr 1 DD, mod AI, mild MR, PASP 32   Hyperlipidemia    Parkinson's disease ([place])    Past Surgical History:  Procedure Laterality Date   BREAST CYST ASPIRATION  1965   Right Breast   HAMMER TOE SURGERY  04/10/02   Left Toe   KNEE SURGERY Right 10/17/15   meniscus repair   NASAL SEPTUM SURGERY  1980   RIGHT/LEFT HEART CATH AND CORONARY ANGIOGRAPHY N/A 03/03/2017   Procedure: RIGHT/LEFT HEART CATH AND CORONARY ANGIOGRAPHY;  Surgeon: Nelva Bush, MD;  Location: [facility];  Service: Cardiovascular;  Laterality: N/A;   THORACIC AORTOGRAM N/A 03/03/2017   Procedure: Thoracic Aortogram;  Surgeon: Nelva Bush, MD;  Location: [facility];  Service: Cardiovascular;  Laterality: N/A;   Family History  Problem Relation Age of Onset   Heart disease Mother    Hyperlipidemia Sister    Hyperlipidemia Brother    Diabetes Sister    Hyperlipidemia Sister    Cancer Sister 61       colon   Cancer Sister        breast   Stroke Brother    Hypertension Brother    Breast cancer Other    Colon cancer Other    Prostate cancer Son    Diabetes Daughter    Stomach cancer Daughter    Healthy Daughter    Social History   Socioeconomic History   Marital status: Widowed    Spouse name: Not on file   Number of children: 4   Years of education: Not on  file   Highest education level: 12th grade  Occupational History   Occupation: retired  Tobacco Use   Smoking status: Never   Smokeless tobacco: Never  Vaping Use   Vaping Use: Never used  Substance and Sexual Activity   Alcohol use: Yes    Comment: social wine    Drug use: No   Sexual activity: Not Currently    Partners: Male  Other Topics Concern   Not on file  Social History Narrative   Exercise-- no   Pt lives with daughter [name] at home   Social Determinants of Health   Financial Resource Strain: Low Risk    Difficulty of Paying Living Expenses: Not hard at all  Food Insecurity: No Food Insecurity   Worried About Charity fundraiser in the Last Year: Never true  Ran Out of Food in the Last Year: Never true  Transportation Needs: No Transportation Needs   Lack of Transportation (Medical): No   Lack of Transportation (Non-Medical): No  Physical Activity: Not on file  Stress: Not on file  Social Connections: Not on file    Tobacco Counseling Counseling given: Not Answered   Clinical Intake:  Pre-visit preparation completed: Yes  Pain : No/denies pain     BMI - recorded: 22.13 Nutritional Status: BMI of 19-24  Normal Nutritional Risks: None Diabetes: No  How often do you need to have someone help you when you read instructions, pamphlets, or other written materials from your doctor or pharmacy?: 1 - Never  Diabetic?No  Interpreter Needed?: No  Information entered by :: Caroleen Hamman LPN   Activities of Daily Living In your present state of health, do you have any difficulty performing the following activities: 08/31/2021  Hearing? N  Vision? N  Difficulty concentrating or making decisions? N  Walking or climbing stairs? Y  Dressing or bathing? Y  Doing errands, shopping? Y  Preparing Food and eating ? Y  Using the Toilet? N  In the past six months, have you accidently leaked urine? N  Do you have problems with loss of bowel control? N   Managing your Medications? Y  Managing your Finances? Y  Housekeeping or managing your Housekeeping? Y  Some recent data might be hidden    Patient Care Team: Carollee Herter, Alferd Apa, DO as PCP - General (Family Medicine) Nahser, Wonda Cheng, MD as PCP - Cardiology (Cardiology) Dahlia Byes, MD as Consulting Physician (Cardiothoracic Surgery) Shon Hough, MD as Consulting Physician (Ophthalmology) Paralee Cancel, MD as Consulting Physician (Orthopedic Surgery)  Indicate any recent Medical Services you may have received from other than [facility] providers in the past year (date may be approximate).     Assessment:   This is a routine wellness examination for [place].  Hearing/Vision screen Hearing Screening - Comments:: No issues Vision Screening - Comments:: Last eye exam-1-2 years ago  Dietary issues and exercise activities discussed: Current Exercise Habits: The patient does not participate in regular exercise at present;Home exercise routine, Type of exercise: walking, Time (Minutes): 15, Frequency (Times/Week): 7, Weekly Exercise (Minutes/Week): 105, Intensity: Mild   Goals Addressed             This Visit's Progress    LIFESTYLE - DECREASE FALLS RISK   On track      Depression Screen PHQ 2/9 Scores 08/31/2021 12/18/2019 08/09/2019 01/25/2019 11/09/2018 11/08/2018 08/15/2018  PHQ - 2 Score 0 1 4 6  0 0 2  PHQ- 9 Score - - 15 16 2 2 7     Fall Risk Fall Risk  08/31/2021 12/19/2020 12/18/2019 09/17/2019 08/09/2019  Falls in the past year? 0 1 1 1 1   Number falls in past yr: 0 1 1 1 1   Injury with Fall? 0 0 1 1 1   Comment - - - - -  Risk Factor Category  - - - - -  Risk for fall due to : - History of fall(s);Impaired balance/gait;Impaired mobility;Mental status change - History of fall(s) -  Risk for fall due to: Comment - - - - -  Follow up Falls prevention discussed Falls evaluation completed Education provided;Falls prevention discussed - Falls evaluation completed    FALL RISK  PREVENTION PERTAINING TO THE HOME:  Any stairs in or around the home? No  Home free of loose throw rugs in walkways, pet beds, electrical cords,  etc? Yes  Adequate lighting in your home to reduce risk of falls? Yes   ASSISTIVE DEVICES UTILIZED TO PREVENT FALLS:  Life alert? Yes -pull cords in apartment Use of a cane, walker or w/c? Yes  Grab bars in the bathroom? Yes  Shower chair or bench in shower? Yes  Elevated toilet seat or a handicapped toilet? Yes   TIMED UP AND GO:  Was the test performed? No . Phone visit   Cognitive Function:Daughter answered questions for patient today. MMSE - Mini Mental State Exam 05/18/2021 02/20/2019  Orientation to time 3 5  Orientation to Place 3 5  Registration 3 3  Attention/ Calculation 2 5  Recall 1 3  Language- name 2 objects 2 2  Language- repeat 1 1  Language- follow 3 step command 3 3  Language- read & follow direction 1 1  Write a sentence 1 1  Copy design 1 1  Total score 21 30     6CIT Screen 12/18/2019 11/09/2018  What Year? 0 points 0 points  What month? 0 points 0 points  What time? 0 points 0 points  Count back from 20 0 points 0 points  Months in reverse 0 points 0 points  Repeat phrase 10 points 10 points  Total Score 10 10    Immunizations Immunization History  Administered Date(s) Administered   Fluad Quad(high Dose 65+) 04/16/2021   Influenza Split 05/11/2011, 05/10/2012   Influenza Whole 05/16/2007, 04/16/2008, 05/19/2009, 04/29/2010   Influenza, High Dose Seasonal PF 05/24/2013, 05/15/2015, 05/13/2016, 04/20/2018   Influenza,inj,Quad PF,6+ Mos 04/29/2014   Influenza-Unspecified 05/16/2017   PPD Test 08/13/2019   Pneumococcal Conjugate-13 01/16/2014   Pneumococcal Polysaccharide-23 06/03/2004   Td 07/05/2006   Tdap 04/10/2013, 04/28/2018    TDAP status: Up to date  Flu Vaccine status: Up to date  Pneumococcal vaccine status: Up to date  Covid-19 vaccine status: Declined, Education has been provided  regarding the importance of this vaccine but patient still declined. Advised may receive this vaccine at local pharmacy or Health Dept.or vaccine clinic. Aware to provide a copy of the vaccination record if obtained from local pharmacy or Health Dept. Verbalized acceptance and understanding.  Qualifies for Shingles Vaccine? Yes   Zostavax completed No   Shingrix Completed?: No.    Education has been provided regarding the importance of this vaccine. Patient has been advised to call insurance company to determine out of pocket expense if they have not yet received this vaccine. Advised may also receive vaccine at local pharmacy or Health Dept. Verbalized acceptance and understanding.  Screening Tests Health Maintenance  Topic Date Due   Zoster Vaccines- Shingrix (1 of 2) Never done   MAMMOGRAM  01/05/2018   COVID-19 Vaccine (1) 09/16/2021 (Originally 03/16/1941)   TETANUS/TDAP  04/28/2028   Pneumonia Vaccine 28+ Years old  Completed   INFLUENZA VACCINE  Completed   DEXA SCAN  Completed   HPV VACCINES  Aged Out    Health Maintenance  Health Maintenance Due  Topic Date Due   Zoster Vaccines- Shingrix (1 of 2) Never done   MAMMOGRAM  01/05/2018    Colorectal cancer screening: No longer required.   Mammogram status: Declined  Bone density status: Declined  Lung Cancer Screening: (Low Dose CT Chest recommended if Age 74-80 years, 30 pack-year currently smoking OR have quit w/in 15years.) does not qualify.     Additional Screening:  Hepatitis C Screening: does not qualify  Vision Screening: Recommended annual ophthalmology exams for early detection of  glaucoma and other disorders of the eye. Is the patient up to date with their annual eye exam?  No  Who is the provider or what is the name of the office in which the patient attends annual eye exams? Unsure of name   Dental Screening: Recommended annual dental exams for proper oral hygiene  Community Resource Referral / Chronic  Care Management: CRR required this visit?  No   CCM required this visit?  No      Plan:     I have personally reviewed and noted the following in the patients chart:   Medical and social history Use of alcohol, tobacco or illicit drugs  Current medications and supplements including opioid prescriptions.  Functional ability and status Nutritional status Physical activity Advanced directives List of other physicians Hospitalizations, surgeries, and ER visits in previous 12 months Vitals Screenings to include cognitive, depression, and falls Referrals and appointments  In addition, I have reviewed and discussed with patient certain preventive protocols, quality metrics, and best practice recommendations. A written personalized care plan for preventive services as well as general preventive health recommendations were provided to patient.   Due to this being a telephonic visit, the after visit summary with patients personalized plan was offered to patient via mail or my-chart. Patient would like to access on my-chart.   Marta Antu, LPN   01/14/6380  Nurse Health Advisor  Nurse Notes: None

## 2021-09-02 ENCOUNTER — Other Ambulatory Visit: Payer: Self-pay | Admitting: Family Medicine

## 2021-09-02 DIAGNOSIS — N3020 Other chronic cystitis without hematuria: Secondary | ICD-10-CM | POA: Diagnosis not present

## 2021-09-07 ENCOUNTER — Telehealth: Payer: Self-pay | Admitting: Family Medicine

## 2021-09-07 NOTE — Telephone Encounter (Signed)
Medication was refilled by  Cathlyn Parsons, PA-C  in 2021. Okay to refill?

## 2021-09-07 NOTE — Telephone Encounter (Signed)
Medication:  bisoprolol (ZEBETA) 5 MG tablet [[id number]]     Has the patient contacted their pharmacy? No. (If no, request that the patient contact the pharmacy for the refill.) (If yes, when and what did the pharmacy advise?)     Preferred Pharmacy (with phone number or street name):  [facility] [place] - [address]  [place], [place] [address]  Phone:  [phone]  Fax:  [phone]     Agent: Please be advised that RX refills may take up to 3 business days. We ask that you follow-up with your pharmacy.

## 2021-09-08 MED ORDER — BISOPROLOL FUMARATE 5 MG PO TABS: 2.5000 mg | ORAL_TABLET | Freq: Every day | ORAL | 0 refills | Status: DC

## 2021-09-08 NOTE — Telephone Encounter (Signed)
Refill sent.

## 2021-09-08 NOTE — Addendum Note (Signed)
Addended by: Sanda Linger on: 09/08/2021 11:27 AM   Modules accepted: Orders

## 2021-09-09 DIAGNOSIS — G20 Parkinson's disease: Secondary | ICD-10-CM | POA: Diagnosis not present

## 2021-09-09 DIAGNOSIS — R1312 Dysphagia, oropharyngeal phase: Secondary | ICD-10-CM | POA: Diagnosis not present

## 2021-09-09 DIAGNOSIS — R41841 Cognitive communication deficit: Secondary | ICD-10-CM | POA: Diagnosis not present

## 2021-09-10 DIAGNOSIS — R41841 Cognitive communication deficit: Secondary | ICD-10-CM | POA: Diagnosis not present

## 2021-09-10 DIAGNOSIS — R1312 Dysphagia, oropharyngeal phase: Secondary | ICD-10-CM | POA: Diagnosis not present

## 2021-09-10 DIAGNOSIS — G20 Parkinson's disease: Secondary | ICD-10-CM | POA: Diagnosis not present

## 2021-09-16 DIAGNOSIS — R262 Difficulty in walking, not elsewhere classified: Secondary | ICD-10-CM | POA: Diagnosis not present

## 2021-09-16 DIAGNOSIS — M6259 Muscle wasting and atrophy, not elsewhere classified, multiple sites: Secondary | ICD-10-CM | POA: Diagnosis not present

## 2021-09-16 DIAGNOSIS — R1312 Dysphagia, oropharyngeal phase: Secondary | ICD-10-CM | POA: Diagnosis not present

## 2021-09-16 DIAGNOSIS — R2681 Unsteadiness on feet: Secondary | ICD-10-CM | POA: Diagnosis not present

## 2021-09-17 DIAGNOSIS — R1312 Dysphagia, oropharyngeal phase: Secondary | ICD-10-CM | POA: Diagnosis not present

## 2021-09-17 DIAGNOSIS — M6259 Muscle wasting and atrophy, not elsewhere classified, multiple sites: Secondary | ICD-10-CM | POA: Diagnosis not present

## 2021-09-17 DIAGNOSIS — R2681 Unsteadiness on feet: Secondary | ICD-10-CM | POA: Diagnosis not present

## 2021-09-17 DIAGNOSIS — R262 Difficulty in walking, not elsewhere classified: Secondary | ICD-10-CM | POA: Diagnosis not present

## 2021-09-18 DIAGNOSIS — M6259 Muscle wasting and atrophy, not elsewhere classified, multiple sites: Secondary | ICD-10-CM | POA: Diagnosis not present

## 2021-09-18 DIAGNOSIS — R2681 Unsteadiness on feet: Secondary | ICD-10-CM | POA: Diagnosis not present

## 2021-09-18 DIAGNOSIS — R262 Difficulty in walking, not elsewhere classified: Secondary | ICD-10-CM | POA: Diagnosis not present

## 2021-09-18 DIAGNOSIS — R1312 Dysphagia, oropharyngeal phase: Secondary | ICD-10-CM | POA: Diagnosis not present

## 2021-09-22 DIAGNOSIS — M6259 Muscle wasting and atrophy, not elsewhere classified, multiple sites: Secondary | ICD-10-CM | POA: Diagnosis not present

## 2021-09-22 DIAGNOSIS — R2681 Unsteadiness on feet: Secondary | ICD-10-CM | POA: Diagnosis not present

## 2021-09-22 DIAGNOSIS — R1312 Dysphagia, oropharyngeal phase: Secondary | ICD-10-CM | POA: Diagnosis not present

## 2021-09-22 DIAGNOSIS — R262 Difficulty in walking, not elsewhere classified: Secondary | ICD-10-CM | POA: Diagnosis not present

## 2021-09-23 DIAGNOSIS — M6259 Muscle wasting and atrophy, not elsewhere classified, multiple sites: Secondary | ICD-10-CM | POA: Diagnosis not present

## 2021-09-23 DIAGNOSIS — R1312 Dysphagia, oropharyngeal phase: Secondary | ICD-10-CM | POA: Diagnosis not present

## 2021-09-23 DIAGNOSIS — R262 Difficulty in walking, not elsewhere classified: Secondary | ICD-10-CM | POA: Diagnosis not present

## 2021-09-23 DIAGNOSIS — R2681 Unsteadiness on feet: Secondary | ICD-10-CM | POA: Diagnosis not present

## 2021-09-24 DIAGNOSIS — M6259 Muscle wasting and atrophy, not elsewhere classified, multiple sites: Secondary | ICD-10-CM | POA: Diagnosis not present

## 2021-09-24 DIAGNOSIS — R1312 Dysphagia, oropharyngeal phase: Secondary | ICD-10-CM | POA: Diagnosis not present

## 2021-09-24 DIAGNOSIS — R262 Difficulty in walking, not elsewhere classified: Secondary | ICD-10-CM | POA: Diagnosis not present

## 2021-09-24 DIAGNOSIS — R2681 Unsteadiness on feet: Secondary | ICD-10-CM | POA: Diagnosis not present

## 2021-09-25 DIAGNOSIS — R262 Difficulty in walking, not elsewhere classified: Secondary | ICD-10-CM | POA: Diagnosis not present

## 2021-09-25 DIAGNOSIS — R1312 Dysphagia, oropharyngeal phase: Secondary | ICD-10-CM | POA: Diagnosis not present

## 2021-09-25 DIAGNOSIS — R2681 Unsteadiness on feet: Secondary | ICD-10-CM | POA: Diagnosis not present

## 2021-09-25 DIAGNOSIS — M6259 Muscle wasting and atrophy, not elsewhere classified, multiple sites: Secondary | ICD-10-CM | POA: Diagnosis not present

## 2021-09-28 DIAGNOSIS — R262 Difficulty in walking, not elsewhere classified: Secondary | ICD-10-CM | POA: Diagnosis not present

## 2021-09-28 DIAGNOSIS — M6259 Muscle wasting and atrophy, not elsewhere classified, multiple sites: Secondary | ICD-10-CM | POA: Diagnosis not present

## 2021-09-28 DIAGNOSIS — R2681 Unsteadiness on feet: Secondary | ICD-10-CM | POA: Diagnosis not present

## 2021-09-28 DIAGNOSIS — R1312 Dysphagia, oropharyngeal phase: Secondary | ICD-10-CM | POA: Diagnosis not present

## 2021-09-29 ENCOUNTER — Encounter (HOSPITAL_COMMUNITY): Payer: Self-pay

## 2021-09-29 ENCOUNTER — Inpatient Hospital Stay (HOSPITAL_COMMUNITY): Payer: Medicare Other

## 2021-09-29 ENCOUNTER — Inpatient Hospital Stay (HOSPITAL_COMMUNITY)
Admission: EM | Admit: 2021-09-29 | Discharge: 2021-10-06 | DRG: 480 | Disposition: A | Discharge status: Skilled Nursing Facility | Payer: Medicare Other | Source: Ambulatory Visit | Attending: Internal Medicine | Admitting: Internal Medicine

## 2021-09-29 ENCOUNTER — Emergency Department (HOSPITAL_COMMUNITY): Payer: Medicare Other

## 2021-09-29 ENCOUNTER — Other Ambulatory Visit: Payer: Self-pay

## 2021-09-29 DIAGNOSIS — W010XXA Fall on same level from slipping, tripping and stumbling without subsequent striking against object, initial encounter: Secondary | ICD-10-CM | POA: Diagnosis present

## 2021-09-29 DIAGNOSIS — I130 Hypertensive heart and chronic kidney disease with heart failure and stage 1 through stage 4 chronic kidney disease, or unspecified chronic kidney disease: Secondary | ICD-10-CM | POA: Diagnosis present

## 2021-09-29 DIAGNOSIS — I5042 Chronic combined systolic (congestive) and diastolic (congestive) heart failure: Secondary | ICD-10-CM | POA: Diagnosis present

## 2021-09-29 DIAGNOSIS — I110 Hypertensive heart disease with heart failure: Secondary | ICD-10-CM | POA: Diagnosis not present

## 2021-09-29 DIAGNOSIS — R2681 Unsteadiness on feet: Secondary | ICD-10-CM | POA: Diagnosis not present

## 2021-09-29 DIAGNOSIS — N179 Acute kidney failure, unspecified: Secondary | ICD-10-CM | POA: Diagnosis not present

## 2021-09-29 DIAGNOSIS — Z8249 Family history of ischemic heart disease and other diseases of the circulatory system: Secondary | ICD-10-CM

## 2021-09-29 DIAGNOSIS — N182 Chronic kidney disease, stage 2 (mild): Secondary | ICD-10-CM | POA: Diagnosis present

## 2021-09-29 DIAGNOSIS — R059 Cough, unspecified: Secondary | ICD-10-CM

## 2021-09-29 DIAGNOSIS — R1312 Dysphagia, oropharyngeal phase: Secondary | ICD-10-CM | POA: Diagnosis not present

## 2021-09-29 DIAGNOSIS — M6259 Muscle wasting and atrophy, not elsewhere classified, multiple sites: Secondary | ICD-10-CM | POA: Diagnosis not present

## 2021-09-29 DIAGNOSIS — Z20822 Contact with and (suspected) exposure to covid-19: Secondary | ICD-10-CM | POA: Diagnosis present

## 2021-09-29 DIAGNOSIS — F419 Anxiety disorder, unspecified: Secondary | ICD-10-CM

## 2021-09-29 DIAGNOSIS — D62 Acute posthemorrhagic anemia: Secondary | ICD-10-CM | POA: Diagnosis not present

## 2021-09-29 DIAGNOSIS — F418 Other specified anxiety disorders: Secondary | ICD-10-CM | POA: Diagnosis not present

## 2021-09-29 DIAGNOSIS — R262 Difficulty in walking, not elsewhere classified: Secondary | ICD-10-CM | POA: Diagnosis not present

## 2021-09-29 DIAGNOSIS — E785 Hyperlipidemia, unspecified: Secondary | ICD-10-CM | POA: Diagnosis present

## 2021-09-29 DIAGNOSIS — I5043 Acute on chronic combined systolic (congestive) and diastolic (congestive) heart failure: Secondary | ICD-10-CM | POA: Diagnosis present

## 2021-09-29 DIAGNOSIS — R627 Adult failure to thrive: Secondary | ICD-10-CM | POA: Diagnosis not present

## 2021-09-29 DIAGNOSIS — F32A Depression, unspecified: Secondary | ICD-10-CM | POA: Diagnosis not present

## 2021-09-29 DIAGNOSIS — M6281 Muscle weakness (generalized): Secondary | ICD-10-CM | POA: Diagnosis not present

## 2021-09-29 DIAGNOSIS — Z419 Encounter for procedure for purposes other than remedying health state, unspecified: Secondary | ICD-10-CM

## 2021-09-29 DIAGNOSIS — Z91041 Radiographic dye allergy status: Secondary | ICD-10-CM | POA: Diagnosis not present

## 2021-09-29 DIAGNOSIS — R41841 Cognitive communication deficit: Secondary | ICD-10-CM | POA: Diagnosis not present

## 2021-09-29 DIAGNOSIS — I7121 Aneurysm of the ascending aorta, without rupture: Secondary | ICD-10-CM | POA: Diagnosis present

## 2021-09-29 DIAGNOSIS — Z880 Allergy status to penicillin: Secondary | ICD-10-CM

## 2021-09-29 DIAGNOSIS — Z83438 Family history of other disorder of lipoprotein metabolism and other lipidemia: Secondary | ICD-10-CM | POA: Diagnosis not present

## 2021-09-29 DIAGNOSIS — S72142A Displaced intertrochanteric fracture of left femur, initial encounter for closed fracture: Principal | ICD-10-CM

## 2021-09-29 DIAGNOSIS — R9431 Abnormal electrocardiogram [ECG] [EKG]: Secondary | ICD-10-CM | POA: Diagnosis not present

## 2021-09-29 DIAGNOSIS — R413 Other amnesia: Secondary | ICD-10-CM | POA: Diagnosis present

## 2021-09-29 DIAGNOSIS — I428 Other cardiomyopathies: Secondary | ICD-10-CM | POA: Diagnosis not present

## 2021-09-29 DIAGNOSIS — E860 Dehydration: Secondary | ICD-10-CM | POA: Diagnosis present

## 2021-09-29 DIAGNOSIS — S72002A Fracture of unspecified part of neck of left femur, initial encounter for closed fracture: Secondary | ICD-10-CM | POA: Diagnosis not present

## 2021-09-29 DIAGNOSIS — Z66 Do not resuscitate: Secondary | ICD-10-CM | POA: Diagnosis present

## 2021-09-29 DIAGNOSIS — G4700 Insomnia, unspecified: Secondary | ICD-10-CM

## 2021-09-29 DIAGNOSIS — G20 Parkinson's disease: Secondary | ICD-10-CM | POA: Diagnosis present

## 2021-09-29 DIAGNOSIS — Z7982 Long term (current) use of aspirin: Secondary | ICD-10-CM | POA: Diagnosis not present

## 2021-09-29 DIAGNOSIS — I509 Heart failure, unspecified: Secondary | ICD-10-CM | POA: Diagnosis not present

## 2021-09-29 DIAGNOSIS — Z79899 Other long term (current) drug therapy: Secondary | ICD-10-CM

## 2021-09-29 DIAGNOSIS — M25552 Pain in left hip: Secondary | ICD-10-CM | POA: Diagnosis not present

## 2021-09-29 DIAGNOSIS — Z01818 Encounter for other preprocedural examination: Secondary | ICD-10-CM | POA: Diagnosis not present

## 2021-09-29 DIAGNOSIS — I10 Essential (primary) hypertension: Secondary | ICD-10-CM | POA: Diagnosis present

## 2021-09-29 DIAGNOSIS — R1310 Dysphagia, unspecified: Secondary | ICD-10-CM | POA: Diagnosis not present

## 2021-09-29 DIAGNOSIS — S72002D Fracture of unspecified part of neck of left femur, subsequent encounter for closed fracture with routine healing: Secondary | ICD-10-CM | POA: Diagnosis not present

## 2021-09-29 DIAGNOSIS — F0393 Unspecified dementia, unspecified severity, with mood disturbance: Secondary | ICD-10-CM | POA: Diagnosis not present

## 2021-09-29 DIAGNOSIS — F0390 Unspecified dementia without behavioral disturbance: Secondary | ICD-10-CM | POA: Diagnosis not present

## 2021-09-29 DIAGNOSIS — K219 Gastro-esophageal reflux disease without esophagitis: Secondary | ICD-10-CM | POA: Diagnosis not present

## 2021-09-29 DIAGNOSIS — D631 Anemia in chronic kidney disease: Secondary | ICD-10-CM | POA: Diagnosis not present

## 2021-09-29 DIAGNOSIS — F0283 Dementia in other diseases classified elsewhere, unspecified severity, with mood disturbance: Secondary | ICD-10-CM | POA: Diagnosis present

## 2021-09-29 DIAGNOSIS — Z9889 Other specified postprocedural states: Secondary | ICD-10-CM | POA: Diagnosis not present

## 2021-09-29 LAB — CBC WITH DIFFERENTIAL/PLATELET
Abs Immature Granulocytes: 0.04 10*3/uL (ref 0.00–0.07)
Basophils Absolute: 0 10*3/uL (ref 0.0–0.1)
Basophils Relative: 0 %
Eosinophils Absolute: 0 10*3/uL (ref 0.0–0.5)
Eosinophils Relative: 0 %
HCT: 31.3 % — ABNORMAL LOW (ref 36.0–46.0)
Hemoglobin: 10 g/dL — ABNORMAL LOW (ref 12.0–15.0)
Immature Granulocytes: 0 %
Lymphocytes Relative: 9 %
Lymphs Abs: 0.8 10*3/uL (ref 0.7–4.0)
MCH: 31.6 pg (ref 26.0–34.0)
MCHC: 31.9 g/dL (ref 30.0–36.0)
MCV: 99.1 fL (ref 80.0–100.0)
Monocytes Absolute: 0.6 10*3/uL (ref 0.1–1.0)
Monocytes Relative: 6 %
Neutro Abs: 7.9 10*3/uL — ABNORMAL HIGH (ref 1.7–7.7)
Neutrophils Relative %: 85 %
Platelets: 252 10*3/uL (ref 150–400)
RBC: 3.16 MIL/uL — ABNORMAL LOW (ref 3.87–5.11)
RDW: 13.1 % (ref 11.5–15.5)
WBC: 9.3 10*3/uL (ref 4.0–10.5)
nRBC: 0 % (ref 0.0–0.2)

## 2021-09-29 LAB — BASIC METABOLIC PANEL
Anion gap: 10 (ref 5–15)
BUN: 32 mg/dL — ABNORMAL HIGH (ref 8–23)
CO2: 25 mmol/L (ref 22–32)
Calcium: 8.8 mg/dL — ABNORMAL LOW (ref 8.9–10.3)
Chloride: 103 mmol/L (ref 98–111)
Creatinine, Ser: 0.82 mg/dL (ref 0.44–1.00)
GFR, Estimated: >60 mL/min (ref >60)
Glucose, Bld: 137 mg/dL — ABNORMAL HIGH (ref 70–99)
Potassium: 4.5 mmol/L (ref 3.5–5.1)
Sodium: 138 mmol/L (ref 135–145)

## 2021-09-29 LAB — ECHOCARDIOGRAM COMPLETE
AR max vel: 2.5 cm2
AV Peak grad: 17.5 mmHg
Ao pk vel: 2.09 m/s
Height: 62 in
P 1/2 time: 263 msec
S' Lateral: 3.2 cm
Weight: 1936 oz

## 2021-09-29 LAB — ABO/RH: ABO/RH(D): A POS

## 2021-09-29 LAB — RESP PANEL BY RT-PCR (FLU A&B, COVID) ARPGX2
Influenza A by PCR: NEGATIVE
Influenza B by PCR: NEGATIVE
SARS Coronavirus 2 by RT PCR: NEGATIVE

## 2021-09-29 MED ORDER — ACETAMINOPHEN 325 MG PO TABS: 650.0000 mg | ORAL_TABLET | Freq: Four times a day (QID) | ORAL | Status: DC | PRN

## 2021-09-29 MED ORDER — BISOPROLOL FUMARATE 5 MG PO TABS
2.5000 mg | ORAL_TABLET | Freq: Every day | ORAL | Status: DC
  Administered 2021-09-29 – 2021-10-06 (×7): 2.5 mg via ORAL
  Filled 2021-09-29 (×7): qty 1

## 2021-09-29 MED ORDER — FUROSEMIDE 40 MG PO TABS: 40.0000 mg | ORAL_TABLET | Freq: Every day | ORAL | Status: DC

## 2021-09-29 MED ORDER — CARBIDOPA-LEVODOPA ER 25-100 MG PO TBCR
2.0000 | EXTENDED_RELEASE_TABLET | Freq: Three times a day (TID) | ORAL | Status: DC
  Administered 2021-09-29 – 2021-10-06 (×19): 2 via ORAL
  Filled 2021-09-29 (×23): qty 2

## 2021-09-29 MED ORDER — PRAVASTATIN SODIUM 20 MG PO TABS
10.0000 mg | ORAL_TABLET | Freq: Every day | ORAL | Status: DC
Start: 2021-09-30 — End: 2021-10-06
  Administered 2021-09-30 – 2021-10-05 (×6): 10 mg via ORAL
  Filled 2021-09-29 (×6): qty 1

## 2021-09-29 MED ORDER — KETOROLAC TROMETHAMINE 15 MG/ML IJ SOLN
15.0000 mg | Freq: Four times a day (QID) | INTRAMUSCULAR | Status: AC | PRN
  Administered 2021-09-30 – 2021-10-02 (×5): 15 mg via INTRAVENOUS
  Filled 2021-09-29 (×5): qty 1

## 2021-09-29 MED ORDER — KETOROLAC TROMETHAMINE 0.5 % OP SOLN: 1.0000 [drp] | Freq: Every day | OPHTHALMIC | Status: DC

## 2021-09-29 MED ORDER — ONDANSETRON HCL 4 MG/2ML IJ SOLN: 4.0000 mg | Freq: Four times a day (QID) | INTRAMUSCULAR | Status: DC | PRN

## 2021-09-29 MED ORDER — ACETAMINOPHEN 650 MG RE SUPP: 650.0000 mg | Freq: Four times a day (QID) | RECTAL | Status: DC | PRN

## 2021-09-29 MED ORDER — LISINOPRIL 10 MG PO TABS
10.0000 mg | ORAL_TABLET | Freq: Every day | ORAL | Status: DC
  Administered 2021-09-29: 10 mg via ORAL
  Filled 2021-09-29: qty 1

## 2021-09-29 MED ORDER — SODIUM CHLORIDE 0.45 % IV SOLN: INTRAVENOUS | Status: AC

## 2021-09-29 MED ORDER — ALPRAZOLAM 0.25 MG PO TABS
0.2500 mg | ORAL_TABLET | Freq: Three times a day (TID) | ORAL | Status: DC | PRN
  Administered 2021-09-30 – 2021-10-04 (×6): 0.25 mg via ORAL
  Filled 2021-09-29 (×6): qty 1

## 2021-09-29 MED ORDER — OXYCODONE HCL 5 MG PO TABS
5.0000 mg | ORAL_TABLET | ORAL | Status: DC | PRN
  Administered 2021-09-30: 5 mg via ORAL
  Filled 2021-09-29: qty 1

## 2021-09-29 MED ORDER — SERTRALINE HCL 100 MG PO TABS
100.0000 mg | ORAL_TABLET | Freq: Every day | ORAL | Status: DC
  Administered 2021-10-01 – 2021-10-06 (×6): 100 mg via ORAL
  Filled 2021-09-29 (×6): qty 1

## 2021-09-29 MED ORDER — ONDANSETRON HCL 4 MG PO TABS: 4.0000 mg | ORAL_TABLET | Freq: Four times a day (QID) | ORAL | Status: DC | PRN

## 2021-09-29 NOTE — Hospital Course (Addendum)
81 year old female from independent living facility with history of memory deficit/dementia easily gets sundowning around 3 to 4 PM, Parkinson's disease, hypertension, chronic combined systolic diastolic CHF, anxiety/depression on Xanax, HLD, HTN, asymptomatic ascending aortic aneurysm stable at 4.2 cm for more than 5 years brought to the ED after she was found on the floor of the independent living facility.  Patient reports that she slipped and fell was down for "20 minutes".  She was having left hip pain and [facility] where she was found to have left intertrochanteric hip fracture and was sent to the ED for evaluation and admission. Last echo 10/21 with EF 30-35%, G1 DD, moderate to severe AR. ? ?In the ED vitals stable, labs showed stable CBC BMP.  Chest x-ray clear ascending thoracic aortic aneurysm again noted.  X-ray of the hip displaced and comminuted intertrochanteric fracture of the left hip, CT of the hip with severely comminuted left intertrochanteric fracture with apex superolateral angulation, intramuscular hematoma in the left gluteus medius muscle. ? ?She was admitted for left hip fracture, s/p IM fixation on 3/15 by Dr. Lyla Glassing.  Medically optimized for discharge to SNF, has a bed available, level 2 PASSR has been approved and now awaiting insurance authorization. ?

## 2021-09-29 NOTE — Plan of Care (Signed)
?  Problem: Clinical Measurements: ?Goal: Ability to maintain clinical measurements within normal limits will improve ?Outcome: Progressing ?  ?Problem: Activity: ?Goal: Risk for activity intolerance will decrease ?Outcome: Progressing ?  ?Problem: Nutrition: ?Goal: Adequate nutrition will be maintained ?Outcome: Progressing ?  ?Problem: Pain Managment: ?Goal: General experience of comfort will improve ?Outcome: Progressing ?  ?

## 2021-09-29 NOTE — ED Provider Notes (Signed)
?[facility] DEPT ?Provider Note ? ? ?CSN: [id number] ?Arrival date & time: 09/29/21  1247 ? ?  ? ?History ? ?Chief Complaint  ?Patient presents with  ? Hip Injury  ? Fall  ? ? ?CARISHA KANTOR is a 81 y.o. female. ? ?81 yo F with a chief complaint of a fall.  Patient states that she was try to walk and was using her walker and think she lost her balance.  This occurred reportedly last night.  Complaining of left hip pain.  She had gone to a [facility] office today and was found to have left intertrochanteric hip fracture and was sent here for evaluation and admission. ? ? ?Fall ? ? ?  ? ?Home Medications ?Prior to Admission medications   ?Medication Sig Start Date End Date Taking? Authorizing Provider  ?acetaminophen (TYLENOL) 325 MG tablet Take 2 tablets (650 mg total) by mouth every 6 (six) hours as needed for mild pain or headache. 05/28/20   Angiulli, Lavon Paganini, PA-C  ?ALPRAZolam (XANAX) 0.25 MG tablet Take 1 tablet (0.25 mg total) by mouth 3 (three) times daily as needed for anxiety or sleep. 07/09/21   Carollee Herter, Alferd Apa, DO  ?aspirin EC 81 MG tablet Take 1 tablet (81 mg total) by mouth daily. 03/01/17   Nahser, Wonda Cheng, MD  ?bisoprolol (ZEBETA) 5 MG tablet Take 0.5 tablets (2.5 mg total) by mouth daily. 09/08/21   Ann Held, DO  ?Bromfenac Sodium (PROLENSA) 0.07 % SOLN Apply 1 drop to eye daily. Right eye    [provider]  ?Carbidopa-Levodopa ER (SINEMET CR) 25-100 MG tablet controlled release TAKE 2 TABLETS BY MOUTH 3 TIMES DAILY. ?Patient taking differently: Take 2 tablets by mouth in the morning, at noon, and at bedtime. 07/06/21   Ann Held, DO  ?furosemide (LASIX) 40 MG tablet TAKE 1 TABLET BY MOUTH DAILY. ?Patient taking differently: Take 40 mg by mouth daily. 05/06/21   Roma Schanz R, DO  ?lisinopril (ZESTRIL) 10 MG tablet TAKE 1 TABLET BY MOUTH DAILY. ?Patient taking differently: Take 10 mg by mouth daily. 05/11/21   Ann Held, DO  ?lovastatin (MEVACOR) 20 MG tablet TAKE 1 TABLET (20 MG TOTAL) BY MOUTH AT BEDTIME. 08/14/21   Carollee Herter, Alferd Apa, DO  ?moxifloxacin (VIGAMOX) 0.5 % ophthalmic solution Place 1 drop into the right eye in the morning, at noon, in the evening, and at bedtime.    [provider]  ?potassium chloride (KLOR-CON) 10 MEQ tablet Take 1 tablet (10 mEq total) by mouth daily. 10/09/20   Ann Held, DO  ?sertraline (ZOLOFT) 100 MG tablet Take 1 tablet (100 mg total) by mouth daily. 05/20/21   Ann Held, DO  ?   ? ?Allergies    ?Iohexol and Penicillins   ? ?Review of Systems   ?Review of Systems ? ?Physical Exam ?Updated Vital Signs ?BP 125/67 (BP Location: Right Arm)   Pulse 79   Temp 97.9 ?F (36.6 ?C) (Oral)   Resp 16   Ht '5\' 2"'$  (1.575 m)   Wt 54.9 kg   LMP 07/19/1988   SpO2 100%   BMI 22.13 kg/m?  ?Physical Exam ?Vitals and nursing note reviewed.  ?Constitutional:   ?   General: She is not in acute distress. ?   Appearance: She is well-developed. She is not diaphoretic.  ?HENT:  ?   Head: Normocephalic and atraumatic.  ?Eyes:  ?  Pupils: Pupils are equal, round, and reactive to light.  ?Cardiovascular:  ?   Rate and Rhythm: Normal rate and regular rhythm.  ?   Heart sounds: No murmur heard. ?  No friction rub. No gallop.  ?Pulmonary:  ?   Effort: Pulmonary effort is normal.  ?   Breath sounds: No wheezing or rales.  ?Abdominal:  ?   General: There is no distension.  ?   Palpations: Abdomen is soft.  ?   Tenderness: There is no abdominal tenderness.  ?Musculoskeletal:     ?   General: Tenderness present.  ?   Cervical back: Normal range of motion and neck supple.  ?   Comments: Shortened left lower extremity.  Intact motor and sensation.  ?Skin: ?   General: Skin is warm and dry.  ?Neurological:  ?   Mental Status: She is alert and oriented to person, place, and time.  ?Psychiatric:     ?   Behavior: Behavior normal.  ? ? ?ED Results / Procedures / Treatments    ?Labs ?(all labs ordered are listed, but only abnormal results are displayed) ?Labs Reviewed  ?CBC WITH DIFFERENTIAL/PLATELET  ?BASIC METABOLIC PANEL  ?TYPE AND SCREEN  ? ? ?EKG ?None ? ?Radiology ?No results found. ? ?Procedures ?Procedures  ? ? ?Medications Ordered in ED ?Medications - No data to display ? ?ED Course/ Medical Decision Making/ A&P ?  ?                        ?Medical Decision Making ?Amount and/or Complexity of Data Reviewed ?Labs: ordered. ?Radiology: ordered. ?ECG/medicine tests: ordered. ? ?Risk ?Decision regarding hospitalization. ? ? ?81 yo F with a chief complaints of a nonsyncopal fall by history complaining of left hip pain.  She denies any other areas of injury.  No other signs of trauma.  She was sent for admission, plan to keep n.p.o. and possibly take to the OR today. ? ?No significant anemia no significant electrolyte abnormality.  Plain film of the hip viewed by me with intertrochanteric hip fracture with displacement. ? ?The patients results and plan were reviewed and discussed.   ?Any x-rays performed were independently reviewed by myself.  ? ?Differential diagnosis were considered with the presenting HPI. ? ?Medications  ?0.45 % sodium chloride infusion (has no administration in time range)  ?acetaminophen (TYLENOL) tablet 650 mg (has no administration in time range)  ?  Or  ?acetaminophen (TYLENOL) suppository 650 mg (has no administration in time range)  ?oxyCODONE (Oxy IR/ROXICODONE) immediate release tablet 5 mg (has no administration in time range)  ?ketorolac (TORADOL) 15 MG/ML injection 15 mg (has no administration in time range)  ?ondansetron (ZOFRAN) tablet 4 mg (has no administration in time range)  ?  Or  ?ondansetron (ZOFRAN) injection 4 mg (has no administration in time range)  ? ? ?Vitals:  ? 09/29/21 1254 09/29/21 1255 09/29/21 1400  ?BP: 125/67 125/67 (!) 146/96  ?Pulse: 79 80 81  ?Resp: '16 17 20  '$ ?Temp: 97.9 ?F (36.6 ?C)    ?TempSrc: Oral    ?SpO2: 100% 100% 92%   ?Weight: 54.9 kg    ?Height: '5\' 2"'$  (1.575 m)    ? ? ?Final diagnoses:  ?Closed comminuted intertrochanteric fracture of left femur ([place])  ? ? ?Admission/ observation were discussed with the admitting physician, patient and/or family and they are comfortable with the plan.  ? ? ? ? ? ? ? ? ?Final Clinical Impression(s) / ED  Diagnoses ?Final diagnoses:  ?Closed comminuted intertrochanteric fracture of left femur ([place])  ? ? ?Rx / DC Orders ?ED Discharge Orders   ? ? None  ? ?  ? ? ?  ?Deno Etienne, DO ?09/29/21 1533 ? ?

## 2021-09-29 NOTE — Consult Note (Signed)
? ? ? ?Chief Complaint: ?Left hip fracture ?History: ?:81 year old female from independent living facility with history of memory deficit/dementia easily gets sundowning around 3 to 4 PM, Parkinson's disease, hypertension, chronic combined systolic diastolic CHF, anxiety/depression on Xanax, HLD, HTN, asymptomatic ascending aortic aneurysm stable at 4.2 cm for more than 5 years brought to the ED after she was found on the floor of the intimate living facility.  Patient reports that she slipped and fell was down for "20 minutes".  She was having left hip pain [facility] found to have left intertrochanteric hip fracture and was sent to the ED for evaluation and admission. Last echo 10/21 with EF 30-35%, G1 DD, moderate to severe AR. ?In the ED vitals stable, labs showed stable CBC BMP.  Chest x-ray clear ascending thoracic aortic aneurysm again noted.  X-ray of the hip displaced and comminuted intertrochanteric fracture of the left hip, CT of the hip with severely comminuted left intertrochanteric fracture with apex superolateral angulation, intramuscular hematoma in the left gluteus medius muscle. ? ? ?ROS: no incontinence of bowel/bladder.  No LOC, dizziness, or blurry vision ? ? ?Past Medical History:  ?Diagnosis Date  ? AAA (abdominal aortic aneurysm)   ? Carotid artery disease ([place]) 01/12/2017  ? Carotid US 6/18: Bilat < 50%; repeat in 12/2000  ? Chronic combined systolic and diastolic CHF (congestive heart failure) ([place]) 03/01/2017  ? NICM // [place] 8/18: normal coronary arteries /  Echo 8/18: EF 20-25, mod AI, mod MR, PASP 32 // Echo 1/19: EF 55-60, no RWMA, Gr 1 DD, mod AI, mild MR, PASP 32  ? Hyperlipidemia   ? Parkinson's disease ([place])   ? ? ?Allergies  ?Allergen Reactions  ? Iohexol Rash  ?   Code: RASH, Desc: PATIENT STATES SHE IS ALLERGIC TO IV DYE. 20 YRS AGO SHE HAD A REACTION AT [facility], PT WAS GIVEN BENADRYL. 07/13/06/RM, Onset Date: [id number] ?  ? Penicillins Hives  ? ? ?No  current facility-administered medications on file prior to encounter.  ? ?Current Outpatient Medications on File Prior to Encounter  ?Medication Sig Dispense Refill  ? acetaminophen (TYLENOL) 325 MG tablet Take 2 tablets (650 mg total) by mouth every 6 (six) hours as needed for mild pain or headache.    ? ALPRAZolam (XANAX) 0.25 MG tablet Take 1 tablet (0.25 mg total) by mouth 3 (three) times daily as needed for anxiety or sleep. 60 tablet 1  ? aspirin EC 81 MG tablet Take 1 tablet (81 mg total) by mouth daily.    ? bisoprolol (ZEBETA) 5 MG tablet Take 0.5 tablets (2.5 mg total) by mouth daily. 45 tablet 0  ? Bromfenac Sodium (PROLENSA) 0.07 % SOLN Apply 1 drop to eye daily. Right eye    ? Carbidopa-Levodopa ER (SINEMET CR) 25-100 MG tablet controlled release TAKE 2 TABLETS BY MOUTH 3 TIMES DAILY. (Patient taking differently: Take 2 tablets by mouth in the morning, at noon, and at bedtime.) 540 tablet 1  ? furosemide (LASIX) 40 MG tablet TAKE 1 TABLET BY MOUTH DAILY. (Patient taking differently: Take 40 mg by mouth daily.) 90 tablet 1  ? lisinopril (ZESTRIL) 10 MG tablet TAKE 1 TABLET BY MOUTH DAILY. (Patient taking differently: Take 10 mg by mouth daily.) 90 tablet 1  ? lovastatin (MEVACOR) 20 MG tablet TAKE 1 TABLET (20 MG TOTAL) BY MOUTH AT BEDTIME. 90 tablet 0  ? moxifloxacin (VIGAMOX) 0.5 % ophthalmic solution Place 1 drop into the right eye in  the morning, at noon, in the evening, and at bedtime.    ? potassium chloride (KLOR-CON) 10 MEQ tablet Take 1 tablet (10 mEq total) by mouth daily. 90 tablet 3  ? sertraline (ZOLOFT) 100 MG tablet Take 1 tablet (100 mg total) by mouth daily. 90 tablet 1  ? ? ?Physical Exam: ?Vitals:  ? 09/29/21 1400 09/29/21 1623  ?BP: (!) 146/96 (!) 143/87  ?Pulse: 81 87  ?Resp: 20 18  ?Temp:  98.9 ?F (37.2 ?C)  ?SpO2: 92% 100%  ? ?Body mass index is 22.13 kg/m?. ?General: Appears stated age, in no acute distress. Unable to ambulate Due to obvious fracture. ?HEENT: Normocephalic. No  Scleral icterus noted.  ?Cardiac: Pulses 2+, cap refill <2 sec.  ?Pulm: Respirations normal, no accessory muscle use.  ?Skin: Warm, dry, intact  ?  ?Left hip exam is extremely limited due to obviously displaced intertrochanteric fracture on radiographs. Patient has no knee ankle or foot tenderness on exam. Patient is able to move the knee foot and ankle without any discomfort. No tenderness palpation over the knee foot or ankle is noted. Patient has generalized tenderness palpation over the hip on exam. No other complaints at this time. Dorsal pedal and posterior to pulses are 2+. Capillary refill less than 2 seconds. Skin warm and dry intact. Distal sensation is intact on exam. Patient is neurovascular intact distally.  ? ?Image: ?CT HIP LEFT WO CONTRAST ? ?Result Date: 09/29/2021 ?CLINICAL DATA:  Status post fall.  Left hip pain. EXAM: CT OF THE LEFT HIP WITHOUT CONTRAST TECHNIQUE: Multidetector CT imaging of the left hip was performed according to the standard protocol. Multiplanar CT image reconstructions were also generated. RADIATION DOSE REDUCTION: This exam was performed according to the departmental dose-optimization program which includes automated exposure control, adjustment of the mA and/or kV according to patient size and/or use of iterative reconstruction technique. COMPARISON:  None. FINDINGS: Bones/Joint/Cartilage Severely comminuted left intertrochanteric fracture with apex superolateral angulation. Lesser trochanter is medially displaced by 14 mm. 12 mm posterolateral displacement of the major greater trochanteric fracture fragment. No other fracture or dislocation. No aggressive osseous lesion. Generalized osteopenia. No joint effusion. Ligaments Ligaments are suboptimally evaluated by CT. Muscles and Tendons No significant muscle atrophy. Intramuscular hematoma in the left gluteus medius muscle. Soft tissue No fluid collection or hematoma.  No soft tissue mass. IMPRESSION: 1. Severely comminuted  left intertrochanteric fracture with apex superolateral angulation. Intramuscular hematoma in the left gluteus medius muscle. Electronically Signed   By: Kathreen Devoid M.D.   On: 09/29/2021 14:33  ? ?DG Chest Port 1 View ? ?Result Date: 09/29/2021 ?CLINICAL DATA:  Preop hip surgery. EXAM: PORTABLE CHEST 1 VIEW COMPARISON:  CT chest 07/09/2020 FINDINGS: No focal consolidation. No pleural effusion or pneumothorax. Heart and mediastinal contours are unremarkable. Ascending thoracic aortic aneurysm again noted. No acute osseous abnormality. IMPRESSION: 1. No acute cardiopulmonary disease. 2. Ascending thoracic aortic aneurysm again noted. Electronically Signed   By: Kathreen Devoid M.D.   On: 09/29/2021 13:51  ? ?DG Knee Left Port ? ?Result Date: 09/29/2021 ?CLINICAL DATA:  [place]. Left knee pain. EXAM: PORTABLE LEFT KNEE - 1-2 VIEW COMPARISON:  None. FINDINGS: Tricompartmental degenerative changes, most significant in the medial compartment. No acute fracture or joint effusion. IMPRESSION: Tricompartmental degenerative changes but no acute bony findings. Electronically Signed   By: Marijo Sanes M.D.   On: 09/29/2021 14:08  ? ?DG HIP UNILAT WITH PELVIS 2-3 VIEWS LEFT ? ?Result Date: 09/29/2021 ?CLINICAL DATA:  [place].  Left hip pain. EXAM: DG HIP (WITH OR WITHOUT PELVIS) 2-3V LEFT COMPARISON:  None. FINDINGS: Displaced and comminuted intertrochanteric fracture of the left hip with a severe varus deformity. The right hip is intact and the visualized bony pelvis is intact. IMPRESSION: Displaced and comminuted intertrochanteric fracture of the left hip. Electronically Signed   By: Marijo Sanes M.D.   On: 09/29/2021 14:07   ? ?A/P: ?After reviewing the patient's radiographs I discussed the patient with Dr. Lyla Glassing . Patient has an Intertrochanter fracture of her left hip with possible extension into the femoral neck. Plan on surgery tomorrow with Dr Lyla Glassing.   ?

## 2021-09-29 NOTE — H&P (View-Only) (Signed)
? ? ? ?Chief Complaint: ?Left hip fracture ?History: ?:81 year old female from independent living facility with history of memory deficit/dementia easily gets sundowning around 3 to 4 PM, Parkinson's disease, hypertension, chronic combined systolic diastolic CHF, anxiety/depression on Xanax, HLD, HTN, asymptomatic ascending aortic aneurysm stable at 4.2 cm for more than 5 years brought to the ED after she was found on the floor of the intimate living facility.  Patient reports that she slipped and fell was down for "20 minutes".  She was having left hip pain [facility] found to have left intertrochanteric hip fracture and was sent to the ED for evaluation and admission. Last echo 10/21 with EF 30-35%, G1 DD, moderate to severe AR. ?In the ED vitals stable, labs showed stable CBC BMP.  Chest x-ray clear ascending thoracic aortic aneurysm again noted.  X-ray of the hip displaced and comminuted intertrochanteric fracture of the left hip, CT of the hip with severely comminuted left intertrochanteric fracture with apex superolateral angulation, intramuscular hematoma in the left gluteus medius muscle. ? ? ?ROS: no incontinence of bowel/bladder.  No LOC, dizziness, or blurry vision ? ? ?Past Medical History:  ?Diagnosis Date  ? AAA (abdominal aortic aneurysm)   ? Carotid artery disease ([place]) 01/12/2017  ? Carotid US 6/18: Bilat < 50%; repeat in 12/2000  ? Chronic combined systolic and diastolic CHF (congestive heart failure) ([place]) 03/01/2017  ? NICM // [place] 8/18: normal coronary arteries /  Echo 8/18: EF 20-25, mod AI, mod MR, PASP 32 // Echo 1/19: EF 55-60, no RWMA, Gr 1 DD, mod AI, mild MR, PASP 32  ? Hyperlipidemia   ? Parkinson's disease ([place])   ? ? ?Allergies  ?Allergen Reactions  ? Iohexol Rash  ?   Code: RASH, Desc: PATIENT STATES SHE IS ALLERGIC TO IV DYE. 20 YRS AGO SHE HAD A REACTION AT [facility], PT WAS GIVEN BENADRYL. 07/13/06/RM, Onset Date: [id number] ?  ? Penicillins Hives  ? ? ?No  current facility-administered medications on file prior to encounter.  ? ?Current Outpatient Medications on File Prior to Encounter  ?Medication Sig Dispense Refill  ? acetaminophen (TYLENOL) 325 MG tablet Take 2 tablets (650 mg total) by mouth every 6 (six) hours as needed for mild pain or headache.    ? ALPRAZolam (XANAX) 0.25 MG tablet Take 1 tablet (0.25 mg total) by mouth 3 (three) times daily as needed for anxiety or sleep. 60 tablet 1  ? aspirin EC 81 MG tablet Take 1 tablet (81 mg total) by mouth daily.    ? bisoprolol (ZEBETA) 5 MG tablet Take 0.5 tablets (2.5 mg total) by mouth daily. 45 tablet 0  ? Bromfenac Sodium (PROLENSA) 0.07 % SOLN Apply 1 drop to eye daily. Right eye    ? Carbidopa-Levodopa ER (SINEMET CR) 25-100 MG tablet controlled release TAKE 2 TABLETS BY MOUTH 3 TIMES DAILY. (Patient taking differently: Take 2 tablets by mouth in the morning, at noon, and at bedtime.) 540 tablet 1  ? furosemide (LASIX) 40 MG tablet TAKE 1 TABLET BY MOUTH DAILY. (Patient taking differently: Take 40 mg by mouth daily.) 90 tablet 1  ? lisinopril (ZESTRIL) 10 MG tablet TAKE 1 TABLET BY MOUTH DAILY. (Patient taking differently: Take 10 mg by mouth daily.) 90 tablet 1  ? lovastatin (MEVACOR) 20 MG tablet TAKE 1 TABLET (20 MG TOTAL) BY MOUTH AT BEDTIME. 90 tablet 0  ? moxifloxacin (VIGAMOX) 0.5 % ophthalmic solution Place 1 drop into the right eye in  the morning, at noon, in the evening, and at bedtime.    ? potassium chloride (KLOR-CON) 10 MEQ tablet Take 1 tablet (10 mEq total) by mouth daily. 90 tablet 3  ? sertraline (ZOLOFT) 100 MG tablet Take 1 tablet (100 mg total) by mouth daily. 90 tablet 1  ? ? ?Physical Exam: ?Vitals:  ? 09/29/21 1400 09/29/21 1623  ?BP: (!) 146/96 (!) 143/87  ?Pulse: 81 87  ?Resp: 20 18  ?Temp:  98.9 ?F (37.2 ?C)  ?SpO2: 92% 100%  ? ?Body mass index is 22.13 kg/m?. ?General: Appears stated age, in no acute distress. Unable to ambulate Due to obvious fracture. ?HEENT: Normocephalic. No  Scleral icterus noted.  ?Cardiac: Pulses 2+, cap refill <2 sec.  ?Pulm: Respirations normal, no accessory muscle use.  ?Skin: Warm, dry, intact  ?  ?Left hip exam is extremely limited due to obviously displaced intertrochanteric fracture on radiographs. Patient has no knee ankle or foot tenderness on exam. Patient is able to move the knee foot and ankle without any discomfort. No tenderness palpation over the knee foot or ankle is noted. Patient has generalized tenderness palpation over the hip on exam. No other complaints at this time. Dorsal pedal and posterior to pulses are 2+. Capillary refill less than 2 seconds. Skin warm and dry intact. Distal sensation is intact on exam. Patient is neurovascular intact distally.  ? ?Image: ?CT HIP LEFT WO CONTRAST ? ?Result Date: 09/29/2021 ?CLINICAL DATA:  Status post fall.  Left hip pain. EXAM: CT OF THE LEFT HIP WITHOUT CONTRAST TECHNIQUE: Multidetector CT imaging of the left hip was performed according to the standard protocol. Multiplanar CT image reconstructions were also generated. RADIATION DOSE REDUCTION: This exam was performed according to the departmental dose-optimization program which includes automated exposure control, adjustment of the mA and/or kV according to patient size and/or use of iterative reconstruction technique. COMPARISON:  None. FINDINGS: Bones/Joint/Cartilage Severely comminuted left intertrochanteric fracture with apex superolateral angulation. Lesser trochanter is medially displaced by 14 mm. 12 mm posterolateral displacement of the major greater trochanteric fracture fragment. No other fracture or dislocation. No aggressive osseous lesion. Generalized osteopenia. No joint effusion. Ligaments Ligaments are suboptimally evaluated by CT. Muscles and Tendons No significant muscle atrophy. Intramuscular hematoma in the left gluteus medius muscle. Soft tissue No fluid collection or hematoma.  No soft tissue mass. IMPRESSION: 1. Severely comminuted  left intertrochanteric fracture with apex superolateral angulation. Intramuscular hematoma in the left gluteus medius muscle. Electronically Signed   By: Kathreen Devoid M.D.   On: 09/29/2021 14:33  ? ?DG Chest Port 1 View ? ?Result Date: 09/29/2021 ?CLINICAL DATA:  Preop hip surgery. EXAM: PORTABLE CHEST 1 VIEW COMPARISON:  CT chest 07/09/2020 FINDINGS: No focal consolidation. No pleural effusion or pneumothorax. Heart and mediastinal contours are unremarkable. Ascending thoracic aortic aneurysm again noted. No acute osseous abnormality. IMPRESSION: 1. No acute cardiopulmonary disease. 2. Ascending thoracic aortic aneurysm again noted. Electronically Signed   By: Kathreen Devoid M.D.   On: 09/29/2021 13:51  ? ?DG Knee Left Port ? ?Result Date: 09/29/2021 ?CLINICAL DATA:  [place]. Left knee pain. EXAM: PORTABLE LEFT KNEE - 1-2 VIEW COMPARISON:  None. FINDINGS: Tricompartmental degenerative changes, most significant in the medial compartment. No acute fracture or joint effusion. IMPRESSION: Tricompartmental degenerative changes but no acute bony findings. Electronically Signed   By: Marijo Sanes M.D.   On: 09/29/2021 14:08  ? ?DG HIP UNILAT WITH PELVIS 2-3 VIEWS LEFT ? ?Result Date: 09/29/2021 ?CLINICAL DATA:  [place].  Left hip pain. EXAM: DG HIP (WITH OR WITHOUT PELVIS) 2-3V LEFT COMPARISON:  None. FINDINGS: Displaced and comminuted intertrochanteric fracture of the left hip with a severe varus deformity. The right hip is intact and the visualized bony pelvis is intact. IMPRESSION: Displaced and comminuted intertrochanteric fracture of the left hip. Electronically Signed   By: Marijo Sanes M.D.   On: 09/29/2021 14:07   ? ?A/P: ?After reviewing the patient's radiographs I discussed the patient with Dr. Lyla Glassing . Patient has an Intertrochanter fracture of her left hip with possible extension into the femoral neck. Plan on surgery tomorrow with Dr Lyla Glassing.   ?

## 2021-09-29 NOTE — Anesthesia Preprocedure Evaluation (Addendum)
Anesthesia Evaluation  ?Patient identified by MRN, date of birth, ID band ?Patient awake ? ? ? ?Reviewed: ?Allergy & Precautions, NPO status , Patient's Chart, lab work & pertinent test results ? ?History of Anesthesia Complications ?Negative for: history of anesthetic complications ? ?Airway ?Mallampati: II ? ?TM Distance: >3 FB ?Neck ROM: Full ? ? ? Dental ?no notable dental hx. ?(+) Dental Advisory Given ?  ?Pulmonary ?neg pulmonary ROS,  ?  ?Pulmonary exam normal ? ? ? ? ? ? ? Cardiovascular ?hypertension, +CHF  ?+ Valvular Problems/Murmurs AI and MR  ?Rhythm:Regular Rate:Normal ?+ Systolic murmurs ?IMPRESSIONS  ? ? ??1. Left ventricular ejection fraction, by estimation, is 30 to 35%. The  ?left ventricle has moderately decreased function. The left ventricle  ?demonstrates regional wall motion abnormalities (see scoring  ?diagram/findings for description). Left ventricular  ??diastolic parameters are consistent with Grade I diastolic dysfunction  ?(impaired relaxation). There is moderate hypokinesis of the left  ?ventricular, entire anteroseptal wall, inferoseptal wall and inferior  ?wall. Hypokinesis of the septal/inferior walls,  ??preserved at apex.  ??2. Right ventricular systolic function is normal. The right ventricular  ?size is normal. There is normal pulmonary artery systolic pressure.  ??3. Left atrial size was severely dilated.  ??4. The mitral valve is normal in structure. Mild mitral valve  ?regurgitation.  ??5. The aortic valve is tricuspid. There is mild calcification of the  ?aortic valve. There is mild thickening of the aortic valve. Aortic valve  ?regurgitation is moderate to severe. Mild to moderate aortic valve  ?sclerosis/calcification is present, without  ?any evidence of aortic stenosis.  ? ?Comparison(s): Prior images reviewed side by side. Changes from prior  ?study are noted.  ? ?Conclusions: ?1. No angiographically significant coronary artery  disease ?2. Upper normal to mildly elevated left and right heart filling pressures. ?3. Normal pulmonary artery pressure. ?4. Low normal Fick cardiac output/index. ?5. Moderate aortic regurgitation and mild dilation of the aortic root. ?6. Right radial artery loop precluding coronary angiography from right radial artery. Recommend alternative approach for future catheterizations. ?? ?Recommendations: ?1. 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 I will start carvedilol 3.125 mg twice a day. Further titration of evidence-based heart failure therapy to be done at outpatient follow-up. ?2. Primary prevention of coronary artery disease. ?3. Continue follow-up of thoracic aortic aneurysm with cardiothoracic surgery. ?? ? ?  ?Neuro/Psych ?PSYCHIATRIC DISORDERS Anxiety Depression negative neurological ROS ?   ? GI/Hepatic ?Neg liver ROS, GERD  ,  ?Endo/Other  ?negative endocrine ROS ? Renal/GU ?negative Renal ROS  ? ?  ?Musculoskeletal ?negative musculoskeletal ROS ?(+)  ? Abdominal ?  ?Peds ? Hematology ? ?(+) Blood dyscrasia, anemia ,   ?Anesthesia Other Findings ? ? Reproductive/Obstetrics ? ?  ? ? ? ? ? ? ? ? ? ? ? ? ? ?  ?  ? ? ? ? ? ? ? ?Anesthesia Physical ?Anesthesia Plan ? ?ASA: 3 ? ?Anesthesia Plan: Spinal  ? ?Post-op Pain Management: Tylenol PO (pre-op)*  ? ?Induction:  ? ?PONV Risk Score and Plan: 3 and Ondansetron, Dexamethasone and Propofol infusion ? ?Airway Management Planned: Natural Airway ? ?Additional Equipment:  ? ?Intra-op Plan:  ? ?Post-operative Plan:  ? ?Informed Consent: I have reviewed the patients History and Physical, chart, labs and discussed the procedure including the risks, benefits and alternatives for the proposed anesthesia with the patient or authorized representative who has indicated his/her understanding and acceptance.  ? ?Patient has DNR.  ?Discussed DNR with  patient, Discussed DNR with power of attorney and Suspend DNR. ?  ?Dental advisory  given and Consent reviewed with POA ? ?Plan Discussed with: Anesthesiologist and CRNA ? ?Anesthesia Plan Comments:   ? ? ? ?Anesthesia Quick Evaluation ? ?

## 2021-09-29 NOTE — H&P (Signed)
History and Physical    Elizabeth Mccullough:[id number] DOB: 1941-01-11 DOA: 09/29/2021  PCP: Donato Schultz, DO   Patient coming from: [facility]  Chief Complaint  Patient presents with   Hip Injury   Fall      HPI:81 year old female from independent living facility with history of memory deficit/dementia easily gets sundowning around 3 to 4 PM, Parkinson's disease, hypertension, chronic combined systolic diastolic CHF, anxiety/depression on Xanax, HLD, HTN, asymptomatic ascending aortic aneurysm stable at 4.2 cm for more than 5 years brought to the ED after she was found on the floor of the intimate living facility.  Patient reports that she slipped and fell was down for "20 minutes".  She was having left hip pain was taken to [facility] found to have left intertrochanteric hip fracture and was sent to the ED for evaluation and admission. Last echo 10/21 with EF 30-35%, G1 DD, moderate to severe AR. In the ED vitals stable, labs showed stable CBC BMP.  Chest x-ray clear ascending thoracic aortic aneurysm again noted.  X-ray of the hip displaced and comminuted intertrochanteric fracture of the left hip, CT of the hip with severely comminuted left intertrochanteric fracture with apex superolateral angulation, intramuscular hematoma in the left gluteus medius muscle. ED physician informed me that  patient is scheduled for surgery tomorrow by Dr. Linna Caprice and admission was requested for further management. On my evaluation at bedside patient is mildly confused, daughter notes that around 3 to 4 PM she gets confused normally and has baseline anxiety.  Patient denies any pain no leg edema.    Assessment/Plan  Closed left hip fracture: CT shows severely comminuted left intertrochanteric fracture with intramuscular hematoma in the left gluteus, secondary to fall: We will admit the patient to MedSurg, orthopedic is planning for operative intervention tomorrow.   Patient does have known congestive systolic heart failure but currently appears compensated, history of thoracic aortic aneurysm, carotid artery stenosis, no known CKD stroke diabetes, she is going for intermediate risk procedure.  Obtain echocardiogram preoperatively. Cont  Pain control with p.o. and IV opiates, Toradol.  Hyperlipidemia: Resume statin  Parkinson disease Memory deficits Anxiety/depression: daughter reports she has mild dementia, gets sundowning in the evening easily.  She is on chronic Xanax resume, will need to use as needed medication for agitation  Chronic combined systolic and diastolic CHF -last EF 30-35% NICM: Currently appears euvolemic.Resume her Lasix ACE inhibitor, beta-blocker once med rec done. Obtain echocardiogram to update.  Aneurysm of ascending aorta-she was previously seen by CT surgery appeared present for more than 5 years and felt to be stable.  Currently denies chest pain  Essential hypertension: BP stable resume home meds once med rec done  Body mass index is 22.13 kg/m.   Severity of Illness: The appropriate patient status for this patient is INPATIENT. Inpatient status is judged to be reasonable and necessary in order to provide the required intensity of service to ensure the patient's safety. The patient's presenting symptoms, physical exam findings, and initial radiographic and laboratory data in the context of their chronic comorbidities is felt to place them at high risk for further clinical deterioration. Furthermore, it is not anticipated that the patient will be medically stable for discharge from the hospital within 2 midnights of admission.   * I certify that at the point of admission it is my clinical judgment that the patient will require inpatient hospital care spanning beyond 2 midnights from the point of admission due  to high intensity of service, high risk for further deterioration and high frequency of surveillance required.*   DVT  prophylaxis: SCDs Start: 09/29/21 1501 Code Status:   Code Status: DNR  Family Communication: Admission, patients condition and plan of care including tests being ordered have been discussed with the patient and her daughter who indicate understanding and agree with the plan and Code Status.  Consults called: Orthopedics.  Review of Systems: All systems were reviewed and were negative except as mentioned in HPI above. Negative for fever Negative for chest pain Negative for shortness of breath  Past Medical History:  Diagnosis Date   AAA (abdominal aortic aneurysm)    Carotid artery disease (HCC) 01/12/2017   Carotid US 6/18: Bilat < 50%; repeat in 12/2000   Chronic combined systolic and diastolic CHF (congestive heart failure) (HCC) 03/01/2017   NICM // LHC 8/18: normal coronary arteries /  Echo 8/18: EF 20-25, mod AI, mod MR, PASP 32 // Echo 1/19: EF 55-60, no RWMA, Gr 1 DD, mod AI, mild MR, PASP 32   Hyperlipidemia    Parkinson's disease (HCC)     Past Surgical History:  Procedure Laterality Date   BREAST CYST ASPIRATION  1965   Right Breast   HAMMER TOE SURGERY  04/10/02   Left Toe   KNEE SURGERY Right 10/17/15   meniscus repair   NASAL SEPTUM SURGERY  1980   RIGHT/LEFT HEART CATH AND CORONARY ANGIOGRAPHY N/A 03/03/2017   Procedure: RIGHT/LEFT HEART CATH AND CORONARY ANGIOGRAPHY;  Surgeon: Yvonne Kendall, MD;  Location: [facility];  Service: Cardiovascular;  Laterality: N/A;   THORACIC AORTOGRAM N/A 03/03/2017   Procedure: Thoracic Aortogram;  Surgeon: Yvonne Kendall, MD;  Location: [facility];  Service: Cardiovascular;  Laterality: N/A;     reports that she has never smoked. She has never used smokeless tobacco. She reports current alcohol use. She reports that she does not use drugs.  Allergies  Allergen Reactions   Iohexol Rash     Code: RASH, Desc: PATIENT STATES SHE IS ALLERGIC TO IV DYE. 20 YRS AGO SHE HAD A REACTION AT [facility], PT WAS GIVEN  BENADRYL. 07/13/06/RM, Onset Date: [id number]    Penicillins Hives    Family History  Problem Relation Age of Onset   Heart disease Mother    Hyperlipidemia Sister    Hyperlipidemia Brother    Diabetes Sister    Hyperlipidemia Sister    Cancer Sister 58       colon   Cancer Sister        breast   Stroke Brother    Hypertension Brother    Breast cancer Other    Colon cancer Other    Prostate cancer Son    Diabetes Daughter    Stomach cancer Daughter    Healthy Daughter      Prior to Admission medications   Medication Sig Start Date End Date Taking? Authorizing Provider  acetaminophen (TYLENOL) 325 MG tablet Take 2 tablets (650 mg total) by mouth every 6 (six) hours as needed for mild pain or headache. 05/28/20   Angiulli, Mcarthur Rossetti, PA-C  ALPRAZolam Prudy Feeler) 0.25 MG tablet Take 1 tablet (0.25 mg total) by mouth 3 (three) times daily as needed for anxiety or sleep. 07/09/21   Zola Button, Grayling Congress, DO  aspirin EC 81 MG tablet Take 1 tablet (81 mg total) by mouth daily. 03/01/17   Nahser, Deloris Ping, MD  bisoprolol (ZEBETA) 5 MG tablet Take 0.5 tablets (  2.5 mg total) by mouth daily. 09/08/21   Donato Schultz, DO  Bromfenac Sodium (PROLENSA) 0.07 % SOLN Apply 1 drop to eye daily. Right eye    [provider]  Carbidopa-Levodopa ER (SINEMET CR) 25-100 MG tablet controlled release TAKE 2 TABLETS BY MOUTH 3 TIMES DAILY. Patient taking differently: Take 2 tablets by mouth in the morning, at noon, and at bedtime. 07/06/21   Seabron Spates R, DO  furosemide (LASIX) 40 MG tablet TAKE 1 TABLET BY MOUTH DAILY. Patient taking differently: Take 40 mg by mouth daily. 05/06/21   Seabron Spates R, DO  lisinopril (ZESTRIL) 10 MG tablet TAKE 1 TABLET BY MOUTH DAILY. Patient taking differently: Take 10 mg by mouth daily. 05/11/21   Seabron Spates R, DO  lovastatin (MEVACOR) 20 MG tablet TAKE 1 TABLET (20 MG TOTAL) BY MOUTH AT BEDTIME. 08/14/21   Zola Button, Grayling Congress, DO   moxifloxacin (VIGAMOX) 0.5 % ophthalmic solution Place 1 drop into the right eye in the morning, at noon, in the evening, and at bedtime.    [provider]  potassium chloride (KLOR-CON) 10 MEQ tablet Take 1 tablet (10 mEq total) by mouth daily. 10/09/20   Donato Schultz, DO  sertraline (ZOLOFT) 100 MG tablet Take 1 tablet (100 mg total) by mouth daily. 05/20/21   Donato Schultz, DO    Physical Exam: Vitals:   09/29/21 1254 09/29/21 1255 09/29/21 1400  BP: 125/67 125/67 (!) 146/96  Pulse: 79 80 81  Resp: 16 17 20   Temp: 97.9 F (36.6 C)    TempSrc: Oral    SpO2: 100% 100% 92%  Weight: 54.9 kg    Height: 5\' 2"  (1.575 m)      General exam: AAO-x2, NAD, weak appearing. HEENT:Oral mucosa moist, Ear/Nose WNL grossly, dentition normal. Respiratory system: bilaterally clear,no wheezing or crackles,no use of accessory muscle Cardiovascular system: S1 & S2 +, No JVD,. Gastrointestinal system: Abdomen soft, NT,ND, BS+ Nervous System:Alert, awake, moving extremities and grossly nonfocal Extremities: No edema, distal peripheral pulses palpable.  Skin: No rashes,no icterus. MSK: Normal muscle bulk,tone, power, mild bruise on the left hip and tender to touch  Labs on Admission: I have personally reviewed following labs and imaging studies  CBC: Recent Labs  Lab 09/29/21 1348  WBC 9.3  NEUTROABS 7.9*  HGB 10.0*  HCT 31.3*  MCV 99.1  PLT 252   Basic Metabolic Panel: Recent Labs  Lab 09/29/21 1348  NA 138  K 4.5  CL 103  CO2 25  GLUCOSE 137*  BUN 32*  CREATININE 0.82  CALCIUM 8.8*   GFR: Estimated Creatinine Clearance: 41.1 mL/min (by C-G formula based on SCr of 0.82 mg/dL). Liver Function Tests: No results for input(s): AST, ALT, ALKPHOS, BILITOT, PROT, ALBUMIN in the last 168 hours. No results for input(s): LIPASE, AMYLASE in the last 168 hours. No results for input(s): AMMONIA in the last 168 hours. Coagulation Profile: No results for input(s):  INR, PROTIME in the last 168 hours. Cardiac Enzymes: No results for input(s): CKTOTAL, CKMB, CKMBINDEX, TROPONINI in the last 168 hours. BNP (last 3 results) No results for input(s): PROBNP in the last 8760 hours. HbA1C: No results for input(s): HGBA1C in the last 72 hours. CBG: No results for input(s): GLUCAP in the last 168 hours. Lipid Profile: No results for input(s): CHOL, HDL, LDLCALC, TRIG, CHOLHDL, LDLDIRECT in the last 72 hours. Thyroid Function Tests: No results for input(s): TSH, T4TOTAL, FREET4, T3FREE,  THYROIDAB in the last 72 hours. Anemia Panel: No results for input(s): VITAMINB12, FOLATE, FERRITIN, TIBC, IRON, RETICCTPCT in the last 72 hours. Urine analysis:   Radiological Exams on Admission: CT HIP LEFT WO CONTRAST  Result Date: 09/29/2021 CLINICAL DATA:  Status post fall.  Left hip pain. EXAM: CT OF THE LEFT HIP WITHOUT CONTRAST TECHNIQUE: Multidetector CT imaging of the left hip was performed according to the standard protocol. Multiplanar CT image reconstructions were also generated. RADIATION DOSE REDUCTION: This exam was performed according to the departmental dose-optimization program which includes automated exposure control, adjustment of the mA and/or kV according to patient size and/or use of iterative reconstruction technique. COMPARISON:  None. FINDINGS: Bones/Joint/Cartilage Severely comminuted left intertrochanteric fracture with apex superolateral angulation. Lesser trochanter is medially displaced by 14 mm. 12 mm posterolateral displacement of the major greater trochanteric fracture fragment. No other fracture or dislocation. No aggressive osseous lesion. Generalized osteopenia. No joint effusion. Ligaments Ligaments are suboptimally evaluated by CT. Muscles and Tendons No significant muscle atrophy. Intramuscular hematoma in the left gluteus medius muscle. Soft tissue No fluid collection or hematoma.  No soft tissue mass. IMPRESSION: 1. Severely comminuted left  intertrochanteric fracture with apex superolateral angulation. Intramuscular hematoma in the left gluteus medius muscle. Electronically Signed   By: Elige Ko M.D.   On: 09/29/2021 14:33   DG Chest Port 1 View  Result Date: 09/29/2021 CLINICAL DATA:  Preop hip surgery. EXAM: PORTABLE CHEST 1 VIEW COMPARISON:  CT chest 07/09/2020 FINDINGS: No focal consolidation. No pleural effusion or pneumothorax. Heart and mediastinal contours are unremarkable. Ascending thoracic aortic aneurysm again noted. No acute osseous abnormality. IMPRESSION: 1. No acute cardiopulmonary disease. 2. Ascending thoracic aortic aneurysm again noted. Electronically Signed   By: Elige Ko M.D.   On: 09/29/2021 13:51   DG Knee Left Port  Result Date: 09/29/2021 CLINICAL DATA:  Larey Seat. Left knee pain. EXAM: PORTABLE LEFT KNEE - 1-2 VIEW COMPARISON:  None. FINDINGS: Tricompartmental degenerative changes, most significant in the medial compartment. No acute fracture or joint effusion. IMPRESSION: Tricompartmental degenerative changes but no acute bony findings. Electronically Signed   By: Rudie Meyer M.D.   On: 09/29/2021 14:08   DG HIP UNILAT WITH PELVIS 2-3 VIEWS LEFT  Result Date: 09/29/2021 CLINICAL DATA:  Larey Seat. Left hip pain. EXAM: DG HIP (WITH OR WITHOUT PELVIS) 2-3V LEFT COMPARISON:  None. FINDINGS: Displaced and comminuted intertrochanteric fracture of the left hip with a severe varus deformity. The right hip is intact and the visualized bony pelvis is intact. IMPRESSION: Displaced and comminuted intertrochanteric fracture of the left hip. Electronically Signed   By: Rudie Meyer M.D.   On: 09/29/2021 14:07      Lanae Boast MD [facility]  If 7PM-7AM, please contact night-coverage [email or web address]  09/29/2021, 3:17 PM

## 2021-09-29 NOTE — ED Triage Notes (Signed)
Pt arrived via POV with caregiver. Per caregiver, pt was found this morning on ground at home. Unknown time of fall, mechanism or LOC. Does not have a caregiver overnight. [facility], per paperwork, displaced fracture of right hip.  ?

## 2021-09-30 ENCOUNTER — Encounter (HOSPITAL_COMMUNITY): Payer: Self-pay | Admitting: Internal Medicine

## 2021-09-30 ENCOUNTER — Other Ambulatory Visit: Payer: Self-pay

## 2021-09-30 ENCOUNTER — Encounter (HOSPITAL_COMMUNITY)
Admission: EM | Disposition: A | Discharge status: Skilled Nursing Facility | Payer: Self-pay | Source: Ambulatory Visit | Attending: Internal Medicine

## 2021-09-30 ENCOUNTER — Inpatient Hospital Stay (HOSPITAL_COMMUNITY): Payer: Medicare Other | Admitting: Anesthesiology

## 2021-09-30 ENCOUNTER — Inpatient Hospital Stay (HOSPITAL_COMMUNITY): Payer: Medicare Other

## 2021-09-30 DIAGNOSIS — S72142A Displaced intertrochanteric fracture of left femur, initial encounter for closed fracture: Secondary | ICD-10-CM

## 2021-09-30 DIAGNOSIS — F418 Other specified anxiety disorders: Secondary | ICD-10-CM

## 2021-09-30 DIAGNOSIS — I509 Heart failure, unspecified: Secondary | ICD-10-CM

## 2021-09-30 DIAGNOSIS — I5042 Chronic combined systolic (congestive) and diastolic (congestive) heart failure: Secondary | ICD-10-CM | POA: Diagnosis not present

## 2021-09-30 DIAGNOSIS — R413 Other amnesia: Secondary | ICD-10-CM | POA: Diagnosis not present

## 2021-09-30 DIAGNOSIS — I110 Hypertensive heart disease with heart failure: Secondary | ICD-10-CM

## 2021-09-30 DIAGNOSIS — S72002A Fracture of unspecified part of neck of left femur, initial encounter for closed fracture: Secondary | ICD-10-CM | POA: Diagnosis not present

## 2021-09-30 DIAGNOSIS — I7121 Aneurysm of the ascending aorta, without rupture: Secondary | ICD-10-CM | POA: Diagnosis not present

## 2021-09-30 LAB — PREPARE RBC (CROSSMATCH)

## 2021-09-30 LAB — BASIC METABOLIC PANEL
Anion gap: 8 (ref 5–15)
BUN: 33 mg/dL — ABNORMAL HIGH (ref 8–23)
CO2: 26 mmol/L (ref 22–32)
Calcium: 8.6 mg/dL — ABNORMAL LOW (ref 8.9–10.3)
Chloride: 104 mmol/L (ref 98–111)
Creatinine, Ser: 1 mg/dL (ref 0.44–1.00)
GFR, Estimated: 56 mL/min — ABNORMAL LOW (ref >60)
Glucose, Bld: 131 mg/dL — ABNORMAL HIGH (ref 70–99)
Potassium: 4.1 mmol/L (ref 3.5–5.1)
Sodium: 138 mmol/L (ref 135–145)

## 2021-09-30 LAB — CBC
HCT: 24.6 % — ABNORMAL LOW (ref 36.0–46.0)
Hemoglobin: 7.9 g/dL — ABNORMAL LOW (ref 12.0–15.0)
MCH: 31.6 pg (ref 26.0–34.0)
MCHC: 32.1 g/dL (ref 30.0–36.0)
MCV: 98.4 fL (ref 80.0–100.0)
Platelets: 212 10*3/uL (ref 150–400)
RBC: 2.5 MIL/uL — ABNORMAL LOW (ref 3.87–5.11)
RDW: 13.3 % (ref 11.5–15.5)
WBC: 7.8 10*3/uL (ref 4.0–10.5)
nRBC: 0 % (ref 0.0–0.2)

## 2021-09-30 LAB — SURGICAL PCR SCREEN
MRSA, PCR: NEGATIVE
Staphylococcus aureus: NEGATIVE

## 2021-09-30 SURGERY — INSERTION, INTRAMEDULLARY ROD, FEMUR
Anesthesia: Spinal | Site: Hip | Laterality: Left

## 2021-09-30 MED ORDER — FENTANYL CITRATE (PF) 100 MCG/2ML IJ SOLN
INTRAMUSCULAR | Status: DC | PRN
  Administered 2021-09-30: 25 ug via INTRAVENOUS

## 2021-09-30 MED ORDER — POLYETHYLENE GLYCOL 3350 17 G PO PACK: 17.0000 g | PACK | Freq: Every day | ORAL | Status: DC | PRN

## 2021-09-30 MED ORDER — DOCUSATE SODIUM 100 MG PO CAPS
100.0000 mg | ORAL_CAPSULE | Freq: Two times a day (BID) | ORAL | Status: DC
  Administered 2021-09-30 – 2021-10-06 (×11): 100 mg via ORAL
  Filled 2021-09-30 (×11): qty 1

## 2021-09-30 MED ORDER — LIDOCAINE HCL (PF) 2 % IJ SOLN
INTRAMUSCULAR | Status: AC
  Filled 2021-09-30: qty 5

## 2021-09-30 MED ORDER — CEFAZOLIN SODIUM-DEXTROSE 2-4 GM/100ML-% IV SOLN
2.0000 g | INTRAVENOUS | Status: AC
  Administered 2021-09-30: 2 g via INTRAVENOUS
  Filled 2021-09-30: qty 100

## 2021-09-30 MED ORDER — TRANEXAMIC ACID-NACL 1000-0.7 MG/100ML-% IV SOLN
1000.0000 mg | INTRAVENOUS | Status: AC
  Administered 2021-09-30: 1000 mg via INTRAVENOUS
  Filled 2021-09-30: qty 100

## 2021-09-30 MED ORDER — FENTANYL CITRATE (PF) 100 MCG/2ML IJ SOLN
INTRAMUSCULAR | Status: AC
  Filled 2021-09-30: qty 2

## 2021-09-30 MED ORDER — ONDANSETRON HCL 4 MG/2ML IJ SOLN
INTRAMUSCULAR | Status: DC | PRN
Start: 2021-09-30 — End: 2021-09-30
  Administered 2021-09-30: 4 mg via INTRAVENOUS

## 2021-09-30 MED ORDER — PHENYLEPHRINE HCL-NACL 20-0.9 MG/250ML-% IV SOLN
INTRAVENOUS | Status: AC
  Filled 2021-09-30: qty 250

## 2021-09-30 MED ORDER — DEXAMETHASONE SODIUM PHOSPHATE 10 MG/ML IJ SOLN
INTRAMUSCULAR | Status: AC
  Filled 2021-09-30: qty 1

## 2021-09-30 MED ORDER — ONDANSETRON HCL 4 MG/2ML IJ SOLN: 4.0000 mg | Freq: Four times a day (QID) | INTRAMUSCULAR | Status: DC | PRN

## 2021-09-30 MED ORDER — SENNA 8.6 MG PO TABS
1.0000 | ORAL_TABLET | Freq: Two times a day (BID) | ORAL | Status: DC
  Administered 2021-09-30 – 2021-10-06 (×11): 8.6 mg via ORAL
  Filled 2021-09-30 (×11): qty 1

## 2021-09-30 MED ORDER — SODIUM CHLORIDE 0.9% IV SOLUTION: Freq: Once | INTRAVENOUS | Status: DC

## 2021-09-30 MED ORDER — METOPROLOL TARTRATE 5 MG/5ML IV SOLN
INTRAVENOUS | Status: AC
  Filled 2021-09-30: qty 5

## 2021-09-30 MED ORDER — ALBUMIN HUMAN 5 % IV SOLN: INTRAVENOUS | Status: DC | PRN

## 2021-09-30 MED ORDER — EPHEDRINE 5 MG/ML INJ
INTRAVENOUS | Status: AC
  Filled 2021-09-30: qty 5

## 2021-09-30 MED ORDER — PHENOL 1.4 % MT LIQD: 1.0000 | OROMUCOSAL | Status: DC | PRN

## 2021-09-30 MED ORDER — PROPOFOL 1000 MG/100ML IV EMUL
INTRAVENOUS | Status: AC
  Filled 2021-09-30: qty 100

## 2021-09-30 MED ORDER — LACTATED RINGERS IV SOLN: INTRAVENOUS | Status: DC | PRN

## 2021-09-30 MED ORDER — ACETAMINOPHEN 500 MG PO TABS: 1000.0000 mg | ORAL_TABLET | Freq: Once | ORAL | Status: DC

## 2021-09-30 MED ORDER — PROPOFOL 10 MG/ML IV BOLUS
INTRAVENOUS | Status: DC | PRN
  Administered 2021-09-30 (×2): 20 mg via INTRAVENOUS

## 2021-09-30 MED ORDER — POVIDONE-IODINE 10 % EX SWAB: 2.0000 "application " | Freq: Once | CUTANEOUS | Status: DC

## 2021-09-30 MED ORDER — METOCLOPRAMIDE HCL 5 MG PO TABS: 5.0000 mg | ORAL_TABLET | Freq: Three times a day (TID) | ORAL | Status: DC | PRN

## 2021-09-30 MED ORDER — SODIUM CHLORIDE 0.9 % IV SOLN: INTRAVENOUS | Status: DC | PRN

## 2021-09-30 MED ORDER — GLYCOPYRROLATE 0.2 MG/ML IJ SOLN
INTRAMUSCULAR | Status: AC
  Filled 2021-09-30: qty 4

## 2021-09-30 MED ORDER — TRANEXAMIC ACID-NACL 1000-0.7 MG/100ML-% IV SOLN
1000.0000 mg | Freq: Once | INTRAVENOUS | Status: AC
  Administered 2021-09-30: 1000 mg via INTRAVENOUS
  Filled 2021-09-30: qty 100

## 2021-09-30 MED ORDER — PHENYLEPHRINE HCL-NACL 20-0.9 MG/250ML-% IV SOLN
INTRAVENOUS | Status: DC | PRN
  Administered 2021-09-30: 50 ug/min via INTRAVENOUS

## 2021-09-30 MED ORDER — ALBUMIN HUMAN 5 % IV SOLN
INTRAVENOUS | Status: AC
  Filled 2021-09-30: qty 250

## 2021-09-30 MED ORDER — ISOPROPYL ALCOHOL 70 % SOLN
Status: AC
  Filled 2021-09-30: qty 480

## 2021-09-30 MED ORDER — CEFAZOLIN SODIUM-DEXTROSE 2-4 GM/100ML-% IV SOLN
2.0000 g | Freq: Four times a day (QID) | INTRAVENOUS | Status: AC
  Administered 2021-09-30 (×2): 2 g via INTRAVENOUS
  Filled 2021-09-30 (×2): qty 100

## 2021-09-30 MED ORDER — DEXAMETHASONE SODIUM PHOSPHATE 10 MG/ML IJ SOLN
INTRAMUSCULAR | Status: DC | PRN
  Administered 2021-09-30: 4 mg via INTRAVENOUS

## 2021-09-30 MED ORDER — ONDANSETRON HCL 4 MG PO TABS: 4.0000 mg | ORAL_TABLET | Freq: Four times a day (QID) | ORAL | Status: DC | PRN

## 2021-09-30 MED ORDER — PROPOFOL 500 MG/50ML IV EMUL
INTRAVENOUS | Status: DC | PRN
  Administered 2021-09-30: 50 ug/kg/min via INTRAVENOUS

## 2021-09-30 MED ORDER — BUPIVACAINE IN DEXTROSE 0.75-8.25 % IT SOLN
INTRATHECAL | Status: DC | PRN
  Administered 2021-09-30: 1.5 mL via INTRATHECAL

## 2021-09-30 MED ORDER — CHLORHEXIDINE GLUCONATE CLOTH 2 % EX PADS
6.0000 | MEDICATED_PAD | Freq: Every day | CUTANEOUS | Status: DC
  Administered 2021-09-30: 6 via TOPICAL

## 2021-09-30 MED ORDER — MENTHOL 3 MG MT LOZG: 1.0000 | LOZENGE | OROMUCOSAL | Status: DC | PRN

## 2021-09-30 MED ORDER — ENOXAPARIN SODIUM 30 MG/0.3ML IJ SOSY
30.0000 mg | PREFILLED_SYRINGE | INTRAMUSCULAR | Status: DC
  Administered 2021-10-01 – 2021-10-04 (×4): 30 mg via SUBCUTANEOUS
  Filled 2021-09-30 (×4): qty 0.3

## 2021-09-30 MED ORDER — CHLORHEXIDINE GLUCONATE 4 % EX LIQD: 60.0000 mL | Freq: Once | CUTANEOUS | Status: DC

## 2021-09-30 MED ORDER — SODIUM CHLORIDE 0.9 % IR SOLN
Status: DC | PRN
  Administered 2021-09-30: 1000 mL

## 2021-09-30 MED ORDER — ISOPROPYL ALCOHOL 70 % SOLN
Status: DC | PRN
  Administered 2021-09-30: 1 via TOPICAL

## 2021-09-30 MED ORDER — ONDANSETRON HCL 4 MG/2ML IJ SOLN
INTRAMUSCULAR | Status: AC
  Filled 2021-09-30: qty 2

## 2021-09-30 MED ORDER — ACETAMINOPHEN 500 MG PO TABS
1000.0000 mg | ORAL_TABLET | Freq: Once | ORAL | Status: AC
  Administered 2021-09-30: 1000 mg via ORAL
  Filled 2021-09-30: qty 2

## 2021-09-30 MED ORDER — SODIUM CHLORIDE 0.45 % IV SOLN: INTRAVENOUS | Status: AC

## 2021-09-30 MED ORDER — METOCLOPRAMIDE HCL 5 MG/ML IJ SOLN: 5.0000 mg | Freq: Three times a day (TID) | INTRAMUSCULAR | Status: DC | PRN

## 2021-09-30 SURGICAL SUPPLY — 47 items
ADH SKN CLS APL DERMABOND .7 (GAUZE/BANDAGES/DRESSINGS) ×1
BAG COUNTER SPONGE SURGICOUNT (BAG) IMPLANT
BAG SPNG CNTER NS LX DISP (BAG)
BAG ZIPLOCK 12X15 (MISCELLANEOUS) IMPLANT
CHLORAPREP W/TINT 26 (MISCELLANEOUS) ×2 IMPLANT
COVER PERINEAL POST (MISCELLANEOUS) ×2 IMPLANT
COVER SURGICAL LIGHT HANDLE (MISCELLANEOUS) ×2 IMPLANT
DERMABOND ADVANCED (GAUZE/BANDAGES/DRESSINGS) ×1
DERMABOND ADVANCED .7 DNX12 (GAUZE/BANDAGES/DRESSINGS) ×1 IMPLANT
DRAPE C-ARM 42X120 X-RAY (DRAPES) ×2 IMPLANT
DRAPE C-ARMOR (DRAPES) ×2 IMPLANT
DRAPE SHEET LG 3/4 BI-LAMINATE (DRAPES) ×2 IMPLANT
DRAPE STERI IOBAN 125X83 (DRAPES) ×2 IMPLANT
DRAPE U-SHAPE 47X51 STRL (DRAPES) ×4 IMPLANT
DRSG AQUACEL AG ADV 3.5X 6 (GAUZE/BANDAGES/DRESSINGS) ×2 IMPLANT
DRSG AQUACEL AG ADV 3.5X10 (GAUZE/BANDAGES/DRESSINGS) ×2 IMPLANT
FACESHIELD WRAPAROUND (MASK) ×6 IMPLANT
FACESHIELD WRAPAROUND OR TEAM (MASK) ×3 IMPLANT
GAUZE SPONGE 4X4 12PLY STRL (GAUZE/BANDAGES/DRESSINGS) ×2 IMPLANT
GLOVE SRG 8 PF TXTR STRL LF DI (GLOVE) ×1 IMPLANT
GLOVE SURG ENC MOIS LTX SZ8.5 (GLOVE) ×4 IMPLANT
GLOVE SURG ENC TEXT LTX SZ8 (GLOVE) ×6 IMPLANT
GLOVE SURG UNDER LTX SZ7.5 (GLOVE) ×2 IMPLANT
GLOVE SURG UNDER POLY LF SZ8 (GLOVE) ×2
GLOVE SURG UNDER POLY LF SZ8.5 (GLOVE) ×2 IMPLANT
GOWN SPEC L3 XXLG W/TWL (GOWN DISPOSABLE) ×4 IMPLANT
GUIDEPIN VERSANAIL DSP 3.2X444 (ORTHOPEDIC DISPOSABLE SUPPLIES) ×2 IMPLANT
HFN 125 DEG 9MM X 180MM (Nail) ×1 IMPLANT
HIP FRA NAIL LAG SCREW 10.5X90 (Orthopedic Implant) ×2 IMPLANT
JET LAVAGE IRRISEPT WOUND (IRRIGATION / IRRIGATOR)
KIT BASIN OR (CUSTOM PROCEDURE TRAY) ×2 IMPLANT
KIT TURNOVER KIT A (KITS) IMPLANT
LAVAGE JET IRRISEPT WOUND (IRRIGATION / IRRIGATOR) IMPLANT
MANIFOLD NEPTUNE II (INSTRUMENTS) ×2 IMPLANT
MARKER SKIN DUAL TIP RULER LAB (MISCELLANEOUS) ×2 IMPLANT
PACK TOTAL JOINT (CUSTOM PROCEDURE TRAY) ×2 IMPLANT
SCREW BONE CORTICAL 5.0X32 (Screw) ×1 IMPLANT
SCREW LAG HIP FRA NAIL 10.5X90 (Orthopedic Implant) IMPLANT
SEALER BIPOLAR AQUA 6.0 (INSTRUMENTS) ×1 IMPLANT
SUT MNCRL AB 3-0 PS2 18 (SUTURE) ×2 IMPLANT
SUT MON AB 2-0 CT1 36 (SUTURE) ×2 IMPLANT
SUT VIC AB 1 CT1 27 (SUTURE) ×2
SUT VIC AB 1 CT1 27XBRD ANTBC (SUTURE) ×1 IMPLANT
TOWEL OR 17X26 10 PK STRL BLUE (TOWEL DISPOSABLE) ×2 IMPLANT
TOWEL OR NON WOVEN STRL DISP B (DISPOSABLE) ×2 IMPLANT
TRAY FOLEY MTR SLVR 14FR STAT (SET/KITS/TRAYS/PACK) ×2 IMPLANT
YANKAUER SUCT BULB TIP NO VENT (SUCTIONS) ×2 IMPLANT

## 2021-09-30 NOTE — Op Note (Signed)
OPERATIVE REPORT ? ?SURGEON: Rod Can, MD  ? ?ASSISTANT: Nehemiah Massed, PA-C. ?Larene Pickett, PA-C. ? ?PREOPERATIVE DIAGNOSIS: Comminuted Left peritrochanteric femur fracture.  ? ?POSTOPERATIVE DIAGNOSIS: Comminuted Left peritrochanteric femur fracture.  ? ?PROCEDURE: Intramedullary fixation, Left femur.  ? ?IMPLANTS: Biomet Affixus Hip Fracture Nail, 9 by 180 mm, 125 degrees. ?10.5 x 90 mm mm Hip Fracture Nail Lag Screw. ?5 x 32 mm distal interlocking screw ?1. ? ?ANESTHESIA:  MAC and Spinal ? ?ESTIMATED BLOOD LOSS:-100 mL   ? ?ANTIBIOTICS: 2 g Ancef. ? ?DRAINS: None. ? ?COMPLICATIONS: None. ?  ?CONDITION: PACU - hemodynamically stable..  ? ?BRIEF CLINICAL NOTE: Elizabeth Mccullough is a 81 y.o. female who presented with an intertrochanteric femur fracture. The patient was admitted to the hospitalist service and underwent perioperative risk stratification and medical optimization. The risks, benefits, and alternatives to the procedure were explained, and the patient elected to proceed. ? ?PROCEDURE IN DETAIL: Surgical site was marked by myself. The patient was taken to the operating room and anesthesia was induced on the bed. The patient was then transferred to the [facility] table and the nonoperative lower extremity was scissored underneath the operative side. The fracture was reduced with traction, internal rotation, and adduction. The fracture was highly comminuted, and the tip of the trochanter was avulsed. However, an acceptable reduction was achieved. The hip was prepped and draped in the normal sterile surgical fashion. Timeout was called verifying side and site of surgery. Preop antibiotics were given with 60 minutes of beginning the procedure. ? ?Fluoroscopy was used to define the patient's anatomy. A 4 cm incision was made just proximal to the tip of the greater trochanter. The awl was used to obtain the standard starting point for a trochanteric entry nail under fluoroscopic control. The fracture site was  highly unstable. Any instrumentation of the femur resulted in change in alignment. The guidepin was placed. The entry reamer was used to open the proximal femur. ? ?On the back table, the nail was assembled onto the jig. The nail was placed into the femur without any difficulty. Through a separate stab incision, the cannula was placed down to the bone in preparation for the cephalomedullary device. A guidepin was placed into the femoral head using AP and lateral fluoroscopy views. The pin was measured, and then reaming was performed to the appropriate depth. I placed an anti rotational guide pin into the head through a separate stab incision. The lag screw was inserted to the appropriate depth. The fracture was compressed through the jig. The setscrew was tightened and then loosened one quarter turn. A separate stab incision was created, and the distal interlocking screw was placed using standard AO technique. The jig was removed. Final AP and lateral fluoroscopy views were obtained to confirm fracture reduction and hardware placement. Tip apex distance was appropriate. There was no chondral penetration. ? ?The wounds were copiously irrigated with saline. The wound was closed in layers with #1 Vicryl for the fascia, 2-0 Monocryl for the deep dermal layer, and 3-0 Monocryl subcuticular stitch. Glue was applied to the skin. Once the glue was fully hardened, sterile dressing was applied. The patient was then awakened from anesthesia and taken to the PACU in stable condition. Sponge needle and instrument counts were correct at the end of the case ?2. There were no known complications. ? ?We will readmit the patient to the hospitalist. Weightbearing status will be weightbearing as tolerated with a walker for transfers. We will begin Lovenox for DVT prophylaxis. The  patient will work with physical therapy and undergo disposition planning. ? ?Please note that a surgical assistant was a medical necessity for this procedure  to perform it in a safe and expeditious manner. Assistant was necessary to provide appropriate retraction of vital neurovascular structures, to prevent femoral fracture, and to allow for anatomic placement of the prosthesis. ?

## 2021-09-30 NOTE — Anesthesia Procedure Notes (Signed)
Spinal ? ?Patient location during procedure: OR ?Start time: 09/30/2021 12:41 PM ?End time: 09/30/2021 12:51 PM ?Reason for block: surgical anesthesia ?Staffing ?Performed: anesthesiologist  ?Anesthesiologist: Duane Boston, MD ?Preanesthetic Checklist ?Completed: patient identified, IV checked, risks and benefits discussed, surgical consent, monitors and equipment checked, pre-op evaluation and timeout performed ?Spinal Block ?Patient position: sitting ?Prep: DuraPrep ?Patient monitoring: cardiac monitor, continuous pulse ox and blood pressure ?Approach: left paramedian ?Location: L2-3 ?Injection technique: single-shot ?Needle ?Needle type: Quincke  ?Needle gauge: 22 G ?Needle length: 9 cm ?Assessment ?Events: CSF return ?Additional Notes ?Functioning IV was confirmed and monitors were applied. Sterile prep and drape, including hand hygiene and sterile gloves were used. The patient was positioned and the spine was prepped. The skin was anesthetized with lidocaine.  Free flow of clear CSF was obtained prior to injecting local anesthetic into the CSF.  The spinal needle aspirated freely following injection.  The needle was carefully withdrawn.  The patient tolerated the procedure well. Difficult placement. ? ? ? ?

## 2021-09-30 NOTE — Interval H&P Note (Signed)
History and Physical Interval Note: ? ?09/30/2021 ?10:55 AM ? ?TABITHIA STRODER  has presented today for surgery, with the diagnosis of Intertrochanteric Fracture LEFT.  The various methods of treatment have been discussed with the patient and family. After consideration of risks, benefits and other options for treatment, the patient has consented to  Procedure(s): ?INTRAMEDULLARY (IM) NAIL FEMORAL (Left) as a surgical intervention.  The patient's history has been reviewed, patient examined, no change in status, stable for surgery.  I have reviewed the patient's chart and labs.  Questions were answered to the patient's and POA's satisfaction.   ? ?The risks, benefits, and alternatives were discussed with the patient. There are risks associated with the surgery including, but not limited to, problems with anesthesia (death), infection, differences in leg length/angulation/rotation, fracture of bones, loosening or failure of implants, malunion, nonunion, hematoma (blood accumulation) which may require surgical drainage, blood clots, pulmonary embolism, nerve injury (foot drop), and blood vessel injury. The patient understands these risks and elects to proceed. ? ? ? ?Hilton Cork Malavika Lira ? ? ?

## 2021-09-30 NOTE — Transfer of Care (Signed)
Immediate Anesthesia Transfer of Care Note ? ?Patient: Elizabeth Mccullough ? ?Procedure(s) Performed: INTRAMEDULLARY (IM) NAIL FEMORAL (Left: Hip) ? ?Patient Location: PACU ? ?Anesthesia Type:Spinal ? ?Level of Consciousness: sedated ? ?Airway & Oxygen Therapy: Patient Spontanous Breathing and Patient connected to face mask oxygen ? ?Post-op Assessment: Report given to RN and Post -op Vital signs reviewed and stable ? ?Post vital signs: Reviewed and stable ? ?Last Vitals:  ?Vitals Value Taken Time  ?BP    ?Temp    ?Pulse    ?Resp    ?SpO2    ? ? ?Last Pain:  ?Vitals:  ? 09/30/21 1116  ?TempSrc: Oral  ?PainSc:   ?   ? ?  ? ?Complications: No notable events documented. ?

## 2021-09-30 NOTE — Discharge Instructions (Addendum)
? ?Dr. Aaron Edelman Swinteck ?Adult Hip & Knee Specialist ?[facility] ?[address] ?[place], [place] [address] ?[phone] ? ? ?POSTOPERATIVE DIRECTIONS ? ? ? ?Hip Rehabilitation, Guidelines Following Surgery  ? ?WEIGHT BEARING ?Weight bearing as tolerated with assist device (walker, cane, etc) as directed, use it as long as suggested by your surgeon or therapist, typically at least 4-6 weeks. ? ? ?HOME CARE INSTRUCTIONS  ?Remove items at home which could result in a fall. This includes throw rugs or furniture in walking pathways.  ?Continue medications as instructed at time of discharge. ?You may have some home medications which will be placed on hold until you complete the course of blood thinner medication. ?4 days after discharge, you may start showering. No tub baths or soaking your incisions. ?Do not put on socks or shoes without following the instructions of your caregivers.   ?Sit on chairs with arms. Use the chair arms to help push yourself up when arising.  ?Arrange for the use of a toilet seat elevator so you are not sitting low.  ?Walk with walker as instructed.  ?You may resume a sexual relationship in one month or when given the OK by your caregiver.  ?Use walker as long as suggested by your caregivers.  ?Avoid periods of inactivity such as sitting longer than an hour when not asleep. This helps prevent blood clots.  ?You may return to work once you are cleared by Engineer, production.  ?Do not drive a car for 6 weeks or until released by your surgeon.  ?Do not drive while taking narcotics.  ?Wear elastic stockings for two weeks following surgery during the day but you may remove then at night.  ?Make sure you keep all of your appointments after your operation with all of your doctors and caregivers. You should call the office at the above phone number and make an appointment for approximately two weeks after the date of your surgery. ?Please pick up a stool softener and laxative for  home use as long as you are requiring pain medications. ?ICE to the affected hip every three hours for 30 minutes at a time and then as needed for pain and swelling. Continue to use ice on the hip for pain and swelling from surgery. You may notice swelling that will progress down to the foot and ankle.  This is normal after surgery.  Elevate the leg when you are not up walking on it.   ?It is important for you to complete the blood thinner medication as prescribed by your doctor. ?Continue to use the breathing machine which will help keep your temperature down.  It is common for your temperature to cycle up and down following surgery, especially at night when you are not up moving around and exerting yourself.  The breathing machine keeps your lungs expanded and your temperature down. ? ?RANGE OF MOTION AND STRENGTHENING EXERCISES  ?These exercises are designed to help you keep full movement of your hip joint. Follow your caregiver's or physical therapist's instructions. Perform all exercises about fifteen times, three times per day or as directed. Exercise both hips, even if you have had only one joint replacement. These exercises can be done on a training (exercise) mat, on the floor, on a table or on a bed. Use whatever works the best and is most comfortable for you. Use music or television while you are exercising so that the exercises are a pleasant break in your day. This will make your life better  with the exercises acting as a break in routine you can look forward to.  ?Lying on your back, slowly slide your foot toward your buttocks, raising your knee up off the floor. Then slowly slide your foot back down until your leg is straight again.  ?Lying on your back spread your legs as far apart as you can without causing discomfort.  ?Lying on your side, raise your upper leg and foot straight up from the floor as far as is comfortable. Slowly lower the leg and repeat.  ?Lying on your back, tighten up the muscle  in the front of your thigh (quadriceps muscles). You can do this by keeping your leg straight and trying to raise your heel off the floor. This helps strengthen the largest muscle supporting your knee.  ?Lying on your back, tighten up the muscles of your buttocks both with the legs straight and with the knee bent at a comfortable angle while keeping your heel on the floor.  ? ?SKILLED REHAB INSTRUCTIONS: ?If the patient is transferred to a skilled rehab facility following release from the hospital, a list of the current medications will be sent to the facility for the patient to continue.  When discharged from the skilled rehab facility, please have the facility set up the patient's [place] prior to being released. Also, the skilled facility will be responsible for providing the patient with their medications at time of release from the facility to include their pain medication and their blood thinner medication. If the patient is still at the rehab facility at time of the two week follow up appointment, the skilled rehab facility will also need to assist the patient in arranging follow up appointment in our office and any transportation needs. ? ?MAKE SURE YOU:  ?Understand these instructions.  ?Will watch your condition.  ?Will get help right away if you are not doing well or get worse. ? ?Pick up stool softner and laxative for home use following surgery while on pain medications. ?Daily dry dressing changes as needed. ?In 4 days, you may remove your dressings and begin taking showers - no tub baths or soaking the incisions. ?Continue to use ice for pain and swelling after surgery. ?Do not use any lotions or creams on the incision until instructed by your surgeon. ? ? ?Additional Discharge Instructions ? ? ?Please get your medications reviewed and adjusted by your Primary MD. ? ?Please request your Primary MD to go over all Hospital Tests and Procedure/Radiological results at the follow up,  please get all Hospital records sent to your Prim MD by signing hospital release before you go home. ? ?If you had Pneumonia of Lung problems at the Hospital: ?Please get a 2 view Chest X ray done in approximately 4 weeks after hospital discharge or sooner if instructed by your Primary MD. ? ?If you have Congestive Heart Failure: ?Please call your Cardiologist or Primary MD anytime you have any of the following symptoms:  ?1) 3 pound weight gain in 24 hours or 5 pounds in 1 week  ?2) shortness of breath, with or without a dry hacking cough  ?3) swelling in the hands, feet or stomach  ?4) if you have to sleep on extra pillows at night in order to breathe ? ?Follow cardiac low salt diet and 1.5 lit/day fluid restriction. ? ?If you have diabetes ?Accuchecks 4 times/day, Once in AM empty stomach and then before each meal. ?Log in all results and show them to your primary  doctor at your next visit. ?If any glucose reading is under 80 or above 300 call your primary MD immediately. ? ?If you have Seizure/Convulsions/Epilepsy: ?Please do not drive, operate heavy machinery, participate in activities at heights or participate in high speed sports until you have seen by Primary MD or a Neurologist and advised to do so again. ?Per [facility] statutes, patients with seizures are not allowed to drive until they have been seizure-free for six months.  ?Use caution when using heavy equipment or power tools. Avoid working on ladders or at heights. Take showers instead of baths. Ensure the water temperature is not too high on the home water heater. Do not go swimming alone. Do not lock yourself in a room alone (i.e. bathroom). When caring for infants or small children, sit down when holding, feeding, or changing them to minimize risk of injury to the child in the event you have a seizure. Maintain good sleep hygiene. Avoid alcohol.  ? ?If you had Gastrointestinal Bleeding: ?Please ask your Primary MD to check a complete blood  count within one week of discharge or at your next visit. Your endoscopic/colonoscopic biopsies that are pending at the time of discharge, will also need to followed by your Primary MD. ? ?Get Medicines re

## 2021-09-30 NOTE — Plan of Care (Signed)
Plan of care reviewed. 

## 2021-09-30 NOTE — Progress Notes (Signed)
?PROGRESS NOTE ? ? ? ?Elizabeth Mccullough  LKJ:[id number] DOB: 1940/11/09 DOA: 09/29/2021 ?PCP: Ann Held, DO  ? ? ? ?Brief Narrative:  ?81 year old female from independent living facility with history of memory deficit/dementia easily gets sundowning around 3 to 4 PM, Parkinson's disease, hypertension, chronic combined systolic diastolic CHF, anxiety/depression on Xanax, HLD, HTN, asymptomatic ascending aortic aneurysm stable at 4.2 cm for more than 5 years brought to the ED after she was found on the floor of the intimate living facility.  Patient reports that she slipped and fell was down for "20 minutes".  She was having left hip pain was [facility] found to have left intertrochanteric hip fracture and was sent to the ED for evaluation and admission. Last echo 10/21 with EF 30-35%, G1 DD, moderate to severe AR. ?In the ED vitals stable, labs showed stable CBC BMP.  Chest x-ray clear ascending thoracic aortic aneurysm again noted.  X-ray of the hip displaced and comminuted intertrochanteric fracture of the left hip, CT of the hip with severely comminuted left intertrochanteric fracture with apex superolateral angulation, intramuscular hematoma in the left gluteus medius muscle. ?ED physician informed me that  patient is scheduled for surgery tomorrow by Dr. Lyla Glassing and admission was requested for further management. ?On my evaluation at bedside patient is mildly confused, daughter notes that around 3 to 4 PM she gets confused normally and has baseline anxiety.  Patient denies any pain no leg edema. ? ? ?Subjective: ? ?She is seen after returned from OR, she is still sedated, daughter at bedside  ? ? ?Assessment & Plan: ? Principal Problem: ?  Closed left hip fracture ([place]) ?Active Problems: ?  Hyperlipidemia ?  Parkinson disease ([place]) ?  Chronic combined systolic and diastolic CHF (congestive heart failure) ([place]) ?  NICM (nonischemic cardiomyopathy) ([place]) ?  Aneurysm of  ascending aorta ?  Memory deficit ?  Anxiety and depression ?  Essential hypertension ? ? ? ?Assessment and Plan: ? ?Closed left hip fracture/Comminuted Left peritrochanteric femur fracture ?Patient is from a facility, reports that she slipped and fell was down for "20 minutes".  ?S/p Intramedullary fixation, Left femur on 3/15 by Dr. Lyla Glassing ?-Postop pain management, wound care, weightbearing status, DVT prophylaxis per Ortho, appreciate Ortho input ? ?Acute anemia ?S/p prbc transfusion x1 prior to going to OR ?Does not appear to have acute external bleed ?Repeat CBC in the morning ? ?Chronic combined CHF/NICM ?Repeat echo EF 40% to 79%, grade 1 diastolic dysfunction ?Continue beta-blocker, hold ACE inhibitor and Lasix perioperatively ?Monitor blood pressure and volume status ?Resume ACE inhibitor and Lasix when appropriate ? ?Hypertension ?Blood pressure low normal currently ?Continue beta-blocker, hold ACE inhibitor and Lasix perioperatively ?Monitor blood pressure and volume status ?Resume ACE inhibitor and Lasix when appropriate ? ?Aneurysm of ascending aorta ?she was previously seen by CT surgery appeared present for more than 5 years and felt to be stable.  Currently denies chest pain ? ?Hyperlipidemia ?Continue statin ? ?Parkinson's disease ?memory deficit/dementia  ?Anxiety/depression ?daughter reports she has mild dementia, easily gets sundowning around 3 to 4 PM,  ?She is on chronic Xanax resume, will need to use as needed medication for agitation ? ?Daughter does not think patient will have bad delirium in the hospital, will monitor, delirium prevention protocol order placed ? ? ? ?: Body mass index is 26.21 kg/m?Marland Kitchen. ?  ? ? ? ?I have Reviewed nursing notes, Vitals, pain scores, I/o's, Lab results and  imaging results  since pt's last encounter, details please see discussion above  ?I ordered the following labs:  ?Unresulted Labs (From admission, onward)  ? ?  Start     Ordered  ? 10/07/21 0500   Creatinine, serum  (enoxaparin (LOVENOX)  CrCl < 30 mL/min)  Weekly,   R     ?Comments: while on enoxaparin therapy. ?  ? 09/30/21 1550  ? 10/01/21 0500  CBC  Daily,   R     ? 09/30/21 1550  ? 10/01/21 7654  Basic metabolic panel  Daily,   R     ? 09/30/21 1550  ? ?  ?  ? ?  ? ? ? ?DVT prophylaxis: enoxaparin (LOVENOX) injection 30 mg Start: 10/01/21 0800 ?SCDs Start: 09/30/21 1551 ? ? ?Code Status:   Code Status: DNR ? ?Family Communication: daughters at bedside  ?Disposition:  ? ?Status is: Inpatient ? ? Dispo: The patient is from: independent living  ?             Anticipated d/c is to: snf  ?             Anticipated d/c date is: snf, family prefers [facility]  ? ?Antimicrobials:   ?Anti-infectives (From admission, onward)  ? ? Start     Dose/Rate Route Frequency Ordered Stop  ? 09/30/21 1845  ceFAZolin (ANCEF) IVPB 2g/100 mL premix       ? 2 g ?200 mL/hr over 30 Minutes Intravenous Every 6 hours 09/30/21 1550 10/01/21 0644  ? 09/30/21 1045  ceFAZolin (ANCEF) IVPB 2g/100 mL premix       ? 2 g ?200 mL/hr over 30 Minutes Intravenous On call to O.R. 09/30/21 0722 09/30/21 1251  ? ?  ? ? ? ? ? ? ?Objective: ?Vitals:  ? 09/30/21 1530 09/30/21 1545 09/30/21 1638 09/30/21 1823  ?BP: (!) 91/57 134/73 (!) 153/60 (!) 145/66  ?Pulse: 72 71 73 74  ?Resp: (!) '21 20 20 18  '$ ?Temp:   98.6 ?F (37 ?C) 98.1 ?F (36.7 ?C)  ?TempSrc:   Oral Oral  ?SpO2: 98% 98% 100% 100%  ?Weight:      ?Height:      ? ? ?Intake/Output Summary (Last 24 hours) at 09/30/2021 1945 ?Last data filed at 09/30/2021 1545 ?Gross per 24 hour  ?Intake 2050.54 ml  ?Output 1170 ml  ?Net 880.54 ml  ? ?Filed Weights  ? 09/29/21 1254 09/30/21 0500  ?Weight: 54.9 kg 65 kg  ? ? ?Examination: ? ?General exam: Frail, drowsy, NAD ?Respiratory system:  Respiratory effort normal. ?Cardiovascular system:  RRR.  ?Gastrointestinal system: Abdomen is nondistended, soft and nontender.  Normal bowel sounds heard. ?Central nervous system: Drowsy. ?Extremities: Postop changes left lower  extremity ?Skin: No rashes, lesions or ulcers ?Psychiatry: Drowsy, no agitation.  ? ? ? ?Data Reviewed: I have personally reviewed  labs and visualized  imaging studies since the last encounter and formulate the plan  ? ? ? ? ? ? ?Scheduled Meds: ? bisoprolol  2.5 mg Oral Daily  ? Carbidopa-Levodopa ER  2 tablet Oral TID  ? Chlorhexidine Gluconate Cloth  6 each Topical Daily  ? docusate sodium  100 mg Oral BID  ? [START ON 10/01/2021] enoxaparin (LOVENOX) injection  30 mg Subcutaneous Q24H  ? pravastatin  10 mg Oral QHS  ? senna  1 tablet Oral BID  ? sertraline  100 mg Oral Daily  ? ?Continuous Infusions: ? sodium chloride 50 mL/hr at 09/30/21 1607  ?  ceFAZolin (ANCEF) IV 2  g (09/30/21 1845)  ? ? ? LOS: 1 day  ? ? ? ?Florencia Reasons, MD PhD FACP ?[facility] ? ?Available via Epic secure chat 7am-7pm for nonurgent issues ?Please page for urgent issues ?To page the attending provider between 7A-7P or the covering provider during after hours 7P-7A, please log into the web site [email or web address] and access using universal [place] password for that web site. If you do not have the password, please call the hospital operator. ? ? ? ?09/30/2021, 7:45 PM  ? ? ?

## 2021-10-01 ENCOUNTER — Inpatient Hospital Stay (HOSPITAL_COMMUNITY): Payer: Medicare Other

## 2021-10-01 ENCOUNTER — Encounter (HOSPITAL_COMMUNITY): Payer: Self-pay | Admitting: Orthopedic Surgery

## 2021-10-01 DIAGNOSIS — I7121 Aneurysm of the ascending aorta, without rupture: Secondary | ICD-10-CM | POA: Diagnosis not present

## 2021-10-01 DIAGNOSIS — S72002A Fracture of unspecified part of neck of left femur, initial encounter for closed fracture: Secondary | ICD-10-CM | POA: Diagnosis not present

## 2021-10-01 DIAGNOSIS — R413 Other amnesia: Secondary | ICD-10-CM | POA: Diagnosis not present

## 2021-10-01 DIAGNOSIS — I5042 Chronic combined systolic (congestive) and diastolic (congestive) heart failure: Secondary | ICD-10-CM | POA: Diagnosis not present

## 2021-10-01 LAB — URINALYSIS, ROUTINE W REFLEX MICROSCOPIC
Bilirubin Urine: NEGATIVE
Glucose, UA: NEGATIVE mg/dL
Ketones, ur: 5 mg/dL — AB
Nitrite: NEGATIVE
Protein, ur: NEGATIVE mg/dL
RBC / HPF: >50 RBC/hpf — ABNORMAL HIGH (ref 0–5)
Specific Gravity, Urine: 1.02 (ref 1.005–1.030)
WBC, UA: >50 WBC/hpf — ABNORMAL HIGH (ref 0–5)
pH: 5 (ref 5.0–8.0)

## 2021-10-01 LAB — BASIC METABOLIC PANEL
Anion gap: 5 (ref 5–15)
BUN: 33 mg/dL — ABNORMAL HIGH (ref 8–23)
CO2: 27 mmol/L (ref 22–32)
Calcium: 8.1 mg/dL — ABNORMAL LOW (ref 8.9–10.3)
Chloride: 105 mmol/L (ref 98–111)
Creatinine, Ser: 1.23 mg/dL — ABNORMAL HIGH (ref 0.44–1.00)
GFR, Estimated: 44 mL/min — ABNORMAL LOW (ref >60)
Glucose, Bld: 103 mg/dL — ABNORMAL HIGH (ref 70–99)
Potassium: 3.9 mmol/L (ref 3.5–5.1)
Sodium: 137 mmol/L (ref 135–145)

## 2021-10-01 LAB — CBC
HCT: 24.3 % — ABNORMAL LOW (ref 36.0–46.0)
Hemoglobin: 7.8 g/dL — ABNORMAL LOW (ref 12.0–15.0)
MCH: 31.6 pg (ref 26.0–34.0)
MCHC: 32.1 g/dL (ref 30.0–36.0)
MCV: 98.4 fL (ref 80.0–100.0)
Platelets: 167 10*3/uL (ref 150–400)
RBC: 2.47 MIL/uL — ABNORMAL LOW (ref 3.87–5.11)
RDW: 15.8 % — ABNORMAL HIGH (ref 11.5–15.5)
WBC: 7.7 10*3/uL (ref 4.0–10.5)
nRBC: 0 % (ref 0.0–0.2)

## 2021-10-01 MED ORDER — HYDROCODONE-ACETAMINOPHEN 5-325 MG PO TABS: 1.0000 | ORAL_TABLET | ORAL | 0 refills | Status: DC | PRN

## 2021-10-01 MED ORDER — ENOXAPARIN SODIUM 30 MG/0.3ML IJ SOSY: 30.0000 mg | PREFILLED_SYRINGE | INTRAMUSCULAR | 0 refills | Status: DC

## 2021-10-01 MED ORDER — SODIUM CHLORIDE 0.9 % IV BOLUS
500.0000 mL | Freq: Once | INTRAVENOUS | Status: AC
  Administered 2021-10-01: 500 mL via INTRAVENOUS

## 2021-10-01 MED ORDER — ACETAMINOPHEN 325 MG PO TABS
650.0000 mg | ORAL_TABLET | Freq: Once | ORAL | Status: AC | PRN
  Administered 2021-10-01: 650 mg via ORAL
  Filled 2021-10-01: qty 2

## 2021-10-01 MED ORDER — SODIUM CHLORIDE 0.9 % IV SOLN: 1.0000 g | INTRAVENOUS | Status: DC

## 2021-10-01 MED ORDER — SODIUM CHLORIDE 0.9 % IV SOLN: INTRAVENOUS | Status: DC

## 2021-10-01 MED ORDER — SODIUM CHLORIDE 0.9 % IV SOLN
1.0000 g | INTRAVENOUS | Status: DC
  Administered 2021-10-01 – 2021-10-02 (×2): 1 g via INTRAVENOUS
  Filled 2021-10-01 (×3): qty 10

## 2021-10-01 MED ORDER — HYDROCODONE-ACETAMINOPHEN 5-325 MG PO TABS
1.0000 | ORAL_TABLET | ORAL | Status: DC | PRN
  Administered 2021-10-01 – 2021-10-06 (×14): 1 via ORAL
  Filled 2021-10-01 (×15): qty 1

## 2021-10-01 NOTE — Plan of Care (Signed)
  Problem: Pain Managment: Goal: General experience of comfort will improve Outcome: Progressing   Problem: Safety: Goal: Ability to remain free from injury will improve Outcome: Progressing   Problem: Skin Integrity: Goal: Risk for impaired skin integrity will decrease Outcome: Progressing   

## 2021-10-01 NOTE — Evaluation (Signed)
Occupational Therapy Evaluation ?Patient Details ?Name: Elizabeth Mccullough ?MRN: [id number] ?DOB: 05/04/1941 ?Today's Date: 10/01/2021 ? ? ?History of Present Illness Patient is an 81 year old female found down on floor at ILF, admitted with L hip fracture. S/p IM 3/15. PMH:  memory deficit/dementia easily gets sundowning around 3 to 4 PM, Parkinson's disease, hypertension, chronic combined systolic diastolic CHF, anxiety/depression on Xanax, HLD, HTN, asymptomatic ascending aortic aneurysm stable at 4.2 cm for more than 5 years  ? ?Clinical Impression ?  ?Patient unable to provide history due to hx of dementia however per chart review patient resides at independent living facility. Currently patient needing +2 assist for bed mobility and min +2 to take few steps to recliner chair. Patient needing max cues for sequencing/hand placement not to pull up onto walker. Patient needing max to total A for lower body ADLs and set up/supervision for upper body ADLs. Due to increased needs of assist would recommend rehab at discharge unless family plans to arrange increased assistance at [facility]. Acute OT to follow.   ?   ? ?Recommendations for follow up therapy are one component of a multi-disciplinary discharge planning process, led by the attending physician.  Recommendations may be updated based on patient status, additional functional criteria and insurance authorization.  ? ?Follow Up Recommendations ? Skilled nursing-short term rehab (<3 hours/day)  ?  ?Assistance Recommended at Discharge Frequent or constant Supervision/Assistance  ?Patient can return home with the following A lot of help with walking and/or transfers;A lot of help with bathing/dressing/bathroom;Direct supervision/assist for medications management;Direct supervision/assist for financial management;Assist for transportation;Help with stairs or ramp for entrance;Assistance with cooking/housework ? ?  ?Functional Status Assessment ? Patient has had a recent  decline in their functional status and demonstrates the ability to make significant improvements in function in a reasonable and predictable amount of time.  ?Equipment Recommendations ? None recommended by OT  ?  ?   ?Precautions / Restrictions Precautions ?Precautions: Fall ?Restrictions ?Weight Bearing Restrictions: Yes ?LLE Weight Bearing: Weight bearing as tolerated ?Other Position/Activity Restrictions: WBAT for transfers  ? ?  ? ?Mobility Bed Mobility ?Overal bed mobility: Needs Assistance ?Bed Mobility: Supine to Sit ?  ?  ?Supine to sit: Max assist, +2 for physical assistance, +2 for safety/equipment, HOB elevated ?  ?  ?General bed mobility comments: Patient assists using R LE however heavy posterior lean needing max for trunk and assist with L LE towards edge of bed ?  ? ? ? ?  ?Balance Overall balance assessment: History of Falls, Needs assistance ?Sitting-balance support: Feet supported ?Sitting balance-Leahy Scale: Fair ?  ?  ?Standing balance support: Reliant on assistive device for balance, During functional activity ?Standing balance-Leahy Scale: Poor ?  ?  ?  ?  ?  ?  ?  ?  ?  ?  ?  ?  ?   ? ?ADL either performed or assessed with clinical judgement  ? ?ADL Overall ADL's : Needs assistance/impaired ?Eating/Feeding: Independent;Sitting ?Eating/Feeding Details (indicate cue type and reason): Drink from cup ?Grooming: Set up;Sitting ?  ?Upper Body Bathing: Supervision/ safety;Sitting ?  ?Lower Body Bathing: Maximal assistance;Sitting/lateral leans;Sit to/from stand ?  ?Upper Body Dressing : Supervision/safety;Sitting ?  ?Lower Body Dressing: Total assistance;Sit to/from stand;Sitting/lateral leans ?Lower Body Dressing Details (indicate cue type and reason): 2* pain, impaired standing tolerance ?Toilet Transfer: Minimal assistance;+2 for safety/equipment;Cueing for safety;Cueing for sequencing;Stand-pivot;Rolling walker (2 wheels);+2 for physical assistance ?Toilet Transfer Details (indicate cue type  and reason): Bed height elevated  patient needing cues for hand placement and min +2 for safety in standing. Patient unsteady but able to take few small steps towards recliner. Max cues for letting go of walker to reach back for chair. ?Toileting- Clothing Manipulation and Hygiene: Total assistance;Sitting/lateral lean;Sit to/from stand ?  ?  ?  ?Functional mobility during ADLs: Minimal assistance;+2 for physical assistance;+2 for safety/equipment;Cueing for safety;Cueing for sequencing;Rolling walker (2 wheels) ?General ADL Comments: Unsure of patient's baseline however currently needing assistance for lower body ADLs, and functional transfers due to pain, impaired balance, safety  ? ? ? ? ?Pertinent Vitals/Pain Pain Assessment ?Pain Assessment: Faces ?Faces Pain Scale: Hurts even more ?Pain Location: L hip ?Pain Descriptors / Indicators: Aching ?Pain Intervention(s): Monitored during session, Premedicated before session  ? ? ? ?Hand Dominance Right ?  ?Extremity/Trunk Assessment Upper Extremity Assessment ?Upper Extremity Assessment: Overall WFL for tasks assessed ?  ?Lower Extremity Assessment ?Lower Extremity Assessment: Defer to PT evaluation ?  ?  ?  ?Communication Communication ?Communication: Expressive difficulties (low phonation, difficulty word finding at times) ?  ?Cognition Arousal/Alertness: Awake/alert ?Behavior During Therapy: Flat affect ?Overall Cognitive Status: History of cognitive impairments - at baseline ?  ?  ?  ?  ?  ?  ?  ?  ?  ?  ?  ?  ?  ?  ?  ?  ?General Comments: Patient oriented to self, month, states year is 2022 ?  ?  ?   ?   ?   ? ? ?Home Living Family/patient expects to be discharged to:: Private residence (ILF) ?  ?Available Help at Discharge: Family;Available PRN/intermittently ?Type of Home: Independent living facility ?  ?  ?  ?  ?  ?  ?  ?  ?  ?  ?  ?Home Equipment: Conservation officer, nature (2 wheels) ?  ?Additional Comments: Patient stating she lives at home with DTR however chart  review states patient lives at [facility] ?  ? ?  ?Prior Functioning/Environment Prior Level of Function : Needs assist;Patient poor historian/Family not available ?  ?  ?  ?  ?  ?  ?Mobility Comments: Patient reports ambulatory with walker ?ADLs Comments: Patient reports DTR assists with dressing, bathing, using bathroom however per chart review patient resides at ILF ?  ? ?  ?  ?OT Problem List: Decreased activity tolerance;Impaired balance (sitting and/or standing);Decreased safety awareness;Pain ?  ?   ?OT Treatment/Interventions: Self-care/ADL training;Therapeutic activities;Patient/family education;Balance training  ?  ?OT Goals(Current goals can be found in the care plan section) Acute Rehab OT Goals ?Patient Stated Goal: Agreeable to sit in chair ?OT Goal Formulation: With patient ?Time For Goal Achievement: 10/15/21 ?Potential to Achieve Goals: Good  ?OT Frequency: Min 2X/week ?  ? ?Co-evaluation PT/OT/SLP Co-Evaluation/Treatment: Yes ?Reason for Co-Treatment: To address functional/ADL transfers;For patient/therapist safety ?PT goals addressed during session: Mobility/safety with mobility ?OT goals addressed during session: ADL's and self-care ?  ? ?  ?AM-PAC OT "6 Clicks" Daily Activity     ?Outcome Measure Help from another person eating meals?: None ?Help from another person taking care of personal grooming?: A Little ?Help from another person toileting, which includes using toliet, bedpan, or urinal?: A Lot ?Help from another person bathing (including washing, rinsing, drying)?: A Lot ?Help from another person to put on and taking off regular upper body clothing?: A Little ?Help from another person to put on and taking off regular lower body clothing?: Total ?6 Click Score: 15 ?  ?End of Session Equipment Utilized  During Treatment: Rolling walker (2 wheels);Gait belt ?Nurse Communication: Mobility status ? ?Activity Tolerance: Patient tolerated treatment well ?Patient left: in chair;with call bell/phone within  reach;with chair alarm set ? ?OT Visit Diagnosis: Unsteadiness on feet (R26.81);Other abnormalities of gait and mobility (R26.89);History of falling (Z91.81)  ?              ?Time: 7955-8316 ?OT Time Calculat
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
## 2021-10-01 NOTE — Progress Notes (Addendum)
? ? ?  Subjective: ? ?Patient reports pain as mild to moderate.  Denies N/V/CP/SOB. Pleasantly confused. ? ?Objective:  ? ?VITALS:   ?Vitals:  ? 09/30/21 1823 09/30/21 2213 10/01/21 0143 10/01/21 0454  ?BP: (!) 145/66 (!) 100/56 104/79 120/85  ?Pulse: 74 73 73 75  ?Resp: '18 18 16 16  '$ ?Temp: 98.1 ?F (36.7 ?C) 99.5 ?F (37.5 ?C) 100.3 ?F (37.9 ?C) (!) 100.4 ?F (38 ?C)  ?TempSrc: Oral Oral Oral Oral  ?SpO2: 100% 92% 94% 100%  ?Weight:      ?Height:      ? ? ?NAD ?Sensation intact distally ?Intact pulses distally ?Dorsiflexion/Plantar flexion intact ?Incision: dressing C/D/I ?Compartment soft ? ? ?Lab Results  ?Component Value Date  ? WBC 7.7 10/01/2021  ? HGB 7.8 (L) 10/01/2021  ? HCT 24.3 (L) 10/01/2021  ? MCV 98.4 10/01/2021  ? PLT 167 10/01/2021  ? ?BMET ?   ?Component Value Date/Time  ? NA 137 10/01/2021 0316  ? NA 142 01/31/2019 0913  ? K 3.9 10/01/2021 0316  ? CL 105 10/01/2021 0316  ? CO2 27 10/01/2021 0316  ? GLUCOSE 103 (H) 10/01/2021 0316  ? BUN 33 (H) 10/01/2021 0316  ? BUN 18 01/31/2019 0913  ? CREATININE 1.23 (H) 10/01/2021 0316  ? CREATININE 0.79 01/03/2015 1226  ? CALCIUM 8.1 (L) 10/01/2021 0316  ? GFRNONAA 44 (L) 10/01/2021 0316  ? ? ? ?Assessment/Plan: ?1 Day Post-Op  ? ?Principal Problem: ?  Closed left hip fracture ([place]) ?Active Problems: ?  Hyperlipidemia ?  Parkinson disease ([place]) ?  Chronic combined systolic and diastolic CHF (congestive heart failure) ([place]) ?  NICM (nonischemic cardiomyopathy) ([place]) ?  Aneurysm of ascending aorta ?  Memory deficit ?  Anxiety and depression ?  Essential hypertension ? ? ?WBAT with walker for transfers ?DVT ppx: Lovenox, SCDs, TEDS ?PO pain control: patient appears overmedicated, will change oxy to norco ?PT/OT ?Dispo: SNF placement ? ? ?Hilton Cork Karson Reede ?10/01/2021, 11:06 AM ? ? ?Rod Can, MD ?[phone] ?[facility]  [facility] ?[address], [place], [place] [address] ?Phone:  [phone] ?[email or web address] ?Facebook  Engineer, structural  ?  ? ? ?

## 2021-10-01 NOTE — Evaluation (Signed)
Physical Therapy Evaluation ?Patient Details ?Name: Elizabeth Mccullough ?MRN: [id number] ?DOB: 1941-05-05 ?Today's Date: 10/01/2021 ? ?History of Present Illness ? Patient is an 81 year old female found down on floor at ILF, admitted with L hip fracture. S/p IM 3/15. PMH:  memory deficit/dementia easily gets sundowning around 3 to 4 PM, Parkinson's disease, hypertension, chronic combined systolic diastolic CHF, anxiety/depression on Xanax, HLD, HTN, asymptomatic ascending aortic aneurysm stable at 4.2 cm for more than 5 years  ?Clinical Impression ? Pt admitted as above and presenting with functional mobility limitations 2* decreased L LE strength/ROM, post op pain and WBAT for transfers only.  Pt would benefit from follow up rehab at SNF level to maximize IND and safety unless family can arrange additional assist to provide 24/7 care at [facility] living facility. ?   ? ?Recommendations for follow up therapy are one component of a multi-disciplinary discharge planning process, led by the attending physician.  Recommendations may be updated based on patient status, additional functional criteria and insurance authorization. ? ?Follow Up Recommendations Skilled nursing-short term rehab (<3 hours/day) (unless family can arrange incresaed assiste at [facility]) ? ?  ?Assistance Recommended at Discharge Frequent or constant Supervision/Assistance  ?Patient can return home with the following ? A lot of help with walking and/or transfers;A lot of help with bathing/dressing/bathroom;Assist for transportation;Help with stairs or ramp for entrance;Assistance with cooking/housework ? ?  ?Equipment Recommendations None recommended by PT  ?Recommendations for Other Services ?    ?  ?Functional Status Assessment Patient has had a recent decline in their functional status and demonstrates the ability to make significant improvements in function in a reasonable and predictable amount of time.  ? ?  ?Precautions / Restrictions  Precautions ?Precautions: Fall ?Restrictions ?Weight Bearing Restrictions: Yes ?LLE Weight Bearing: Weight bearing as tolerated ?Other Position/Activity Restrictions: WBAT for transfers  ? ?  ? ?Mobility ? Bed Mobility ?Overal bed mobility: Needs Assistance ?Bed Mobility: Supine to Sit ?  ?  ?Supine to sit: Max assist, +2 for physical assistance, +2 for safety/equipment, HOB elevated ?  ?  ?General bed mobility comments: Patient assists using R LE however heavy posterior lean needing max for trunk and assist with L LE towards edge of bed ?  ? ?Transfers ?Overall transfer level: Needs assistance ?Equipment used: Rolling walker (2 wheels) ?Transfers: Sit to/from Stand ?Sit to Stand: Min assist, Mod assist, +2 physical assistance, +2 safety/equipment, From elevated surface ?  ?  ?  ?  ?  ?General transfer comment: cues for LE management and use of UEs to self assist.  Physical assist to bring wt up and fwd and to balance in intial standing with RW ?  ? ?Ambulation/Gait ?Ambulation/Gait assistance: Min assist, +2 physical assistance, +2 safety/equipment ?Gait Distance (Feet): 2 Feet ?Assistive device: Rolling walker (2 wheels) ?Gait Pattern/deviations: Step-to pattern, Decreased step length - right, Decreased step length - left, Shuffle, Trunk flexed ?Gait velocity: decr ?  ?  ?General Gait Details: cues for posture, position from RW and sequence ? ?Stairs ?  ?  ?  ?  ?  ? ?Wheelchair Mobility ?  ? ?Modified Rankin (Stroke Patients Only) ?  ? ?  ? ?Balance Overall balance assessment: History of Falls, Needs assistance ?Sitting-balance support: Feet supported ?Sitting balance-Leahy Scale: Fair ?  ?  ?Standing balance support: Reliant on assistive device for balance, During functional activity ?Standing balance-Leahy Scale: Poor ?  ?  ?  ?  ?  ?  ?  ?  ?  ?  ?  ?  ?   ? ? ? ?  Pertinent Vitals/Pain Pain Assessment ?Pain Assessment: Faces ?Faces Pain Scale: Hurts even more ?Pain Location: L hip ?Pain Descriptors /  Indicators: Aching ?Pain Intervention(s): Limited activity within patient's tolerance, Monitored during session, Premedicated before session, Ice applied  ? ? ?Home Living Family/patient expects to be discharged to:: Unsure ?  ?Available Help at Discharge: Family;Available PRN/intermittently ?Type of Home: Independent living facility ?  ?  ?  ?  ?  ?Home Equipment: Conservation officer, nature (2 wheels) ?Additional Comments: Patient stating she lives at home with DTR however chart review states patient lives at [facility]  ?  ?Prior Function Prior Level of Function : Needs assist;Patient poor historian/Family not available ?  ?  ?  ?  ?  ?  ?Mobility Comments: Patient reports ambulatory with walker ?ADLs Comments: Patient reports DTR assists with dressing, bathing, using bathroom however per chart review patient resides at ILF ?  ? ? ?Hand Dominance  ? Dominant Hand: Right ? ?  ?Extremity/Trunk Assessment  ? Upper Extremity Assessment ?Upper Extremity Assessment: Overall WFL for tasks assessed ?  ? ?Lower Extremity Assessment ?Lower Extremity Assessment: LLE deficits/detail ?  ? ?   ?Communication  ? Communication: Expressive difficulties  ?Cognition Arousal/Alertness: Awake/alert ?Behavior During Therapy: Flat affect ?Overall Cognitive Status: History of cognitive impairments - at baseline ?  ?  ?  ?  ?  ?  ?  ?  ?  ?  ?  ?  ?  ?  ?  ?  ?General Comments: Patient oriented to self, month, states year is 2022 ?  ?  ? ?  ?General Comments   ? ?  ?Exercises    ? ?Assessment/Plan  ?  ?PT Assessment Patient needs continued PT services  ?PT Problem List Decreased strength;Decreased range of motion;Decreased activity tolerance;Decreased balance;Decreased mobility;Decreased cognition;Decreased knowledge of use of DME;Pain ? ?   ?  ?PT Treatment Interventions DME instruction;Gait training;Functional mobility training;Therapeutic activities;Therapeutic exercise;Balance training;Patient/family education   ? ?PT Goals (Current goals can be found  in the Care Plan section)  ?Acute Rehab PT Goals ?Patient Stated Goal: Regain IND ?PT Goal Formulation: With patient ?Time For Goal Achievement: 10/15/21 ?Potential to Achieve Goals: Fair ? ?  ?Frequency Min 3X/week ?  ? ? ?Co-evaluation PT/OT/SLP Co-Evaluation/Treatment: Yes ?Reason for Co-Treatment: To address functional/ADL transfers ?PT goals addressed during session: Mobility/safety with mobility ?OT goals addressed during session: ADL's and self-care ?  ? ? ?  ?AM-PAC PT "6 Clicks" Mobility  ?Outcome Measure Help needed turning from your back to your side while in a flat bed without using bedrails?: A Lot ?Help needed moving from lying on your back to sitting on the side of a flat bed without using bedrails?: A Lot ?Help needed moving to and from a bed to a chair (including a wheelchair)?: A Lot ?Help needed standing up from a chair using your arms (e.g., wheelchair or bedside chair)?: A Lot ?Help needed to walk in hospital room?: A Lot ?Help needed climbing 3-5 steps with a railing? : Total ?6 Click Score: 11 ? ?  ?End of Session Equipment Utilized During Treatment: Gait belt ?Activity Tolerance: Patient tolerated treatment well ?Patient left: in chair;with call bell/phone within reach;with chair alarm set ?Nurse Communication: Mobility status ?PT Visit Diagnosis: Unsteadiness on feet (R26.81);Difficulty in walking, not elsewhere classified (R26.2);Pain ?Pain - Right/Left: Left ?Pain - part of body: Hip ?  ? ?Time: 8466-5993 ?PT Time Calculation (min) (ACUTE ONLY): 19 min ? ? ?Charges:   PT Evaluation ?$PT Eval  Low Complexity: 1 Low ?  ?  ?   ? ? ?Debe Coder PT ?Acute Rehabilitation Services ?Pager [phone] ?Office [phone] ? ? ?Sanyiah Kanzler ?10/01/2021, 11:52 AM ? ?

## 2021-10-01 NOTE — Progress Notes (Signed)
?PROGRESS NOTE ? ? ? ?Elizabeth Mccullough  WNU:[id number] DOB: [birth year] DOA: 09/29/2021 ?PCP: Ann Held, DO  ? ? ? ?Brief Narrative:  ?81 year old female from independent living facility with history of memory deficit/dementia easily gets sundowning around 3 to 4 PM, Parkinson's disease, hypertension, chronic combined systolic diastolic CHF, anxiety/depression on Xanax, HLD, HTN, asymptomatic ascending aortic aneurysm stable at 4.2 cm for more than 5 years brought to the ED after she was found on the floor of the intimate living facility.  Patient reports that she slipped and fell was down for "20 minutes".  She was having left hip pain was taken [facility] found to have left intertrochanteric hip fracture and was sent to the ED for evaluation and admission. Last echo 10/21 with EF 30-35%, G1 DD, moderate to severe AR. ?In the ED vitals stable, labs showed stable CBC BMP.  Chest x-ray clear ascending thoracic aortic aneurysm again noted.  X-ray of the hip displaced and comminuted intertrochanteric fracture of the left hip, CT of the hip with severely comminuted left intertrochanteric fracture with apex superolateral angulation, intramuscular hematoma in the left gluteus medius muscle. ?ED physician informed me that  patient is scheduled for surgery tomorrow by Dr. Lyla Glassing and admission was requested for further management. ?On my evaluation at bedside patient is mildly confused, daughter notes that around 3 to 4 PM she gets confused normally and has baseline anxiety.  Patient denies any pain no leg edema. ? ? ?Subjective: ? ?POD#1, temp 100.4 this am ?Hgb 7.8 ? ?She is sitting up in chair, pleasantly confused,. She reports pain with movement, denies pain at rest ? ?Assessment & Plan: ? Principal Problem: ?  Closed left hip fracture ([place]) ?Active Problems: ?  Hyperlipidemia ?  Parkinson disease ([place]) ?  Chronic combined systolic and diastolic CHF (congestive heart failure) ([place]) ?  NICM  (nonischemic cardiomyopathy) ([place]) ?  Aneurysm of ascending aorta ?  Memory deficit ?  Anxiety and depression ?  Essential hypertension ? ? ? ?Assessment and Plan: ? ? ?Fever 100.4 on postop day 1 ?No leukocytosis ?Check UA/urine culture/blood culture ?Chest x-ray obtained from 3/14 unremarkable ?Incentive spirometer ?Monitor  ? ?Closed left hip fracture/Comminuted Left peritrochanteric femur fracture ?Patient is from a facility, reports that she slipped and fell was down for "20 minutes".  ?S/p Intramedullary fixation, Left femur on 3/15 by Dr. Lyla Glassing ?-Postop pain management, wound care, weightbearing status, DVT prophylaxis per Ortho, appreciate Ortho input ? ?Acute anemia ?S/p prbc transfusion x1 prior to going to OR ?Does not appear to have acute external bleed ?Hgb 7.8, may need another prbc if decreasing on repeat, will recheck cbc in am ? ?AKI on CKDII ?Cr 1.23, ua and urine culture ordered, pending collection ?She appear dehydrated, start hydration,repeat bmp in am ?Renal dosing meds, hold lasix and acei ? ?Chronic combined CHF/NICM ?Repeat echo EF 40% to 03%, grade 1 diastolic dysfunction ?Continue beta-blocker,  ?Continue hold ACE inhibitor and Lasix perioperatively ?Today she appear dehydrated with borderline low bp, start hydration  ?Resume ACE inhibitor and Lasix when appropriate ? ?Hypertension ?Blood pressure low normal currently ?Continue beta-blocker, hold ACE inhibitor and Lasix perioperatively ?Monitor blood pressure and volume status ?Resume ACE inhibitor and Lasix when appropriate ? ?Aneurysm of ascending aorta ?she was previously seen by CT surgery appeared present for more than 5 years and felt to be stable.  Currently denies chest pain ? ?Hyperlipidemia ?Continue statin ? ?Parkinson's disease ?memory deficit/dementia  ?Anxiety/depression ?daughter reports  she has mild dementia, easily gets sundowning around 3 to 4 PM,  ?She is on chronic Xanax resume, will need to use as needed medication  for agitation ? ?Daughter does not think patient will have bad delirium in the hospital, will monitor, delirium prevention protocol order placed ?She is pleasantly confused today during encounter  ? ? ?: Body mass index is 26.21 kg/m?Marland Kitchen. ?  ? ? ? ?I have Reviewed nursing notes, Vitals, pain scores, I/o's, Lab results and  imaging results since pt's last encounter, details please see discussion above  ?I ordered the following labs:  ?Unresulted Labs (From admission, onward)  ? ?  Start     Ordered  ? 10/07/21 0500  Creatinine, serum  (enoxaparin (LOVENOX)  CrCl < 30 mL/min)  Weekly,   R     ?Comments: while on enoxaparin therapy. ?  ? 09/30/21 1550  ? 10/01/21 0735  Culture, blood (routine x 2)  BLOOD CULTURE X 2,   R (with TIMED occurrences)     ? 10/01/21 0734  ? 10/01/21 0734  Urinalysis, Routine w reflex microscopic  Once,   R       ? 10/01/21 0734  ? 10/01/21 0734  Urine Culture  Once,   R       ?Question:  Indication  Answer:  Altered mental status (if no other cause identified)  ? 10/01/21 0734  ? 10/01/21 0500  CBC  Daily,   R     ? 09/30/21 1550  ? 10/01/21 3419  Basic metabolic panel  Daily,   R     ? 09/30/21 1550  ? ?  ?  ? ?  ? ? ? ?DVT prophylaxis: enoxaparin (LOVENOX) injection 30 mg Start: 10/01/21 0800 ?SCDs Start: 09/30/21 1551 ? ? ?Code Status:   Code Status: DNR ? ?Family Communication: daughters at bedside on 3/16 ?Disposition:  ? ?Status is: Inpatient ? ? Dispo: The patient is from: independent living  ?             Anticipated d/c is to: snf  ?             Anticipated d/c date is: snf, family prefers [facility]  ? ?Antimicrobials:   ?Anti-infectives (From admission, onward)  ? ? Start     Dose/Rate Route Frequency Ordered Stop  ? 09/30/21 1845  ceFAZolin (ANCEF) IVPB 2g/100 mL premix       ? 2 g ?200 mL/hr over 30 Minutes Intravenous Every 6 hours 09/30/21 1550 10/01/21 0019  ? 09/30/21 1045  ceFAZolin (ANCEF) IVPB 2g/100 mL premix       ? 2 g ?200 mL/hr over 30 Minutes Intravenous On call to O.R.  09/30/21 0722 09/30/21 1251  ? ?  ? ? ? ? ? ? ?Objective: ?Vitals:  ? 10/01/21 0143 10/01/21 0454 10/01/21 1148 10/01/21 1203  ?BP: 104/79 120/85  (!) 91/38  ?Pulse: 73 75 80 65  ?Resp: '16 16  19  '$ ?Temp: 100.3 ?F (37.9 ?C) (!) 100.4 ?F (38 ?C)  97.7 ?F (36.5 ?C)  ?TempSrc: Oral Oral    ?SpO2: 94% 100%  94%  ?Weight:      ?Height:      ? ? ?Intake/Output Summary (Last 24 hours) at 10/01/2021 1235 ?Last data filed at 10/01/2021 1057 ?Gross per 24 hour  ?Intake 1150 ml  ?Output 1000 ml  ?Net 150 ml  ? ?Filed Weights  ? 09/29/21 1254 09/30/21 0500  ?Weight: 54.9 kg 65 kg  ? ? ?Examination: ? ?  General exam: Frail, alert, communicative, pleasantly confused  ?Respiratory system:  lungs are clear, Respiratory effort normal. ?Cardiovascular system:  RRR.  ?Gastrointestinal system: Abdomen is nondistended, soft and nontender.  Normal bowel sounds heard. ?Central nervous system: only oriented to person but calm and cooperative ?Extremities: Postop changes left lower extremity ?Skin: No rashes, lesions or ulcers ?Psychiatry: calm, no agitation.  ? ? ? ?Data Reviewed: I have personally reviewed  labs and visualized  imaging studies since the last encounter and formulate the plan  ? ? ? ? ? ? ?Scheduled Meds: ? bisoprolol  2.5 mg Oral Daily  ? Carbidopa-Levodopa ER  2 tablet Oral TID  ? Chlorhexidine Gluconate Cloth  6 each Topical Daily  ? docusate sodium  100 mg Oral BID  ? enoxaparin (LOVENOX) injection  30 mg Subcutaneous Q24H  ? pravastatin  10 mg Oral QHS  ? senna  1 tablet Oral BID  ? sertraline  100 mg Oral Daily  ? ?Continuous Infusions: ? sodium chloride    ? sodium chloride    ? ? ? ? LOS: 2 days  ? ? ? ?Florencia Reasons, MD PhD FACP ?[facility] ? ?Available via Epic secure chat 7am-7pm for nonurgent issues ?Please page for urgent issues ?To page the attending provider between 7A-7P or the covering provider during after hours 7P-7A, please log into the web site [email or web address] and access using universal [place] password  for that web site. If you do not have the password, please call the hospital operator. ? ? ? ?10/01/2021, 12:35 PM  ? ? ?

## 2021-10-01 NOTE — Anesthesia Postprocedure Evaluation (Signed)
Anesthesia Post Note ? ?Patient: Elizabeth Mccullough ? ?Procedure(s) Performed: INTRAMEDULLARY (IM) NAIL FEMORAL (Left: Hip) ? ?  ? ?Patient location during evaluation: PACU ?Anesthesia Type: Spinal ?Level of consciousness: awake and alert ?Pain management: pain level controlled ?Vital Signs Assessment: post-procedure vital signs reviewed and stable ?Respiratory status: spontaneous breathing and respiratory function stable ?Cardiovascular status: blood pressure returned to baseline and stable ?Postop Assessment: spinal receding ?Anesthetic complications: no ? ? ?No notable events documented. ? ? ? ?  ?  ?  ?  ?  ?  ? ?Raahil Ong DANIEL ? ? ? ? ?

## 2021-10-01 NOTE — Progress Notes (Signed)
Physical Therapy Treatment ?Patient Details ?Name: Elizabeth Mccullough ?MRN: [id number] ?DOB: 17-Mar-1941 ?Today's Date: 10/01/2021 ? ? ?History of Present Illness Patient is an 81 year old female found down on floor at ILF, admitted with L hip fracture. S/p IM 3/15. PMH:  memory deficit/dementia easily gets sundowning around 3 to 4 PM, Parkinson's disease, hypertension, chronic combined systolic diastolic CHF, anxiety/depression on Xanax, HLD, HTN, asymptomatic ascending aortic aneurysm stable at 4.2 cm for more than 5 years ? ?  ?PT Comments  ? ? Pt continues very pleasant and cooperative but with flat affect and difficulty following cues - most notably does not comprehend to step back to bedside.   This date, pt assisted up from chair and took step ahead, completed turn but unable to step back - bed pulled forward to allow pt return to sitting.Elizabeth Mccullough PT ?Acute Rehabilitation Services ?Pager [phone] ?Office [phone] ?  ?Recommendations for follow up therapy are one component of a multi-disciplinary discharge planning process, led by the attending physician.  Recommendations may be updated based on patient status, additional functional criteria and insurance authorization. ? ?Follow Up Recommendations ? Skilled nursing-short term rehab (<3 hours/day) ?  ?  ?Assistance Recommended at Discharge Frequent or constant Supervision/Assistance  ?Patient can return home with the following A lot of help with walking and/or transfers;A lot of help with bathing/dressing/bathroom;Assist for transportation;Help with stairs or ramp for entrance;Assistance with cooking/housework ?  ?Equipment Recommendations ? None recommended by PT  ?  ?Recommendations for Other Services   ? ? ?  ?Precautions / Restrictions Precautions ?Precautions: Fall ?Restrictions ?Weight Bearing Restrictions: Yes ?LLE Weight Bearing: Weight bearing as tolerated ?Other Position/Activity Restrictions: WBAT for transfers  ?  ? ?Mobility ? Bed  Mobility ?Overal bed mobility: Needs Assistance ?Bed Mobility: Sit to Supine ?  ?  ?Supine to sit: Max assist, +2 for physical assistance, +2 for safety/equipment, HOB elevated ?Sit to supine: Max assist, +2 for physical assistance, +2 for safety/equipment ?  ?General bed mobility comments: Increased time with cues for sequence and use of R LE to self assist but with limited follow through by pt ?  ? ?Transfers ?Overall transfer level: Needs assistance ?Equipment used: Rolling walker (2 wheels) ?Transfers: Sit to/from Stand ?Sit to Stand: Min assist, Mod assist, +2 physical assistance, +2 safety/equipment, From elevated surface ?  ?  ?  ?  ?  ?General transfer comment: cues for LE management and use of UEs to self assist.  Physical assist to bring wt up and fwd and to balance in intial standing with RW ?  ? ?Ambulation/Gait ?Ambulation/Gait assistance: +2 physical assistance, +2 safety/equipment, Min assist, Mod assist ?Gait Distance (Feet): 1 Feet ?Assistive device: Rolling walker (2 wheels) ?Gait Pattern/deviations: Step-to pattern, Decreased step length - right, Decreased step length - left, Shuffle, Trunk flexed ?Gait velocity: decr ?  ?  ?General Gait Details: cues for posture, position from RW and sequence.  Pt able to take step fwd and complete turn but unable to comprehend stepping bkwd and bed pulled up closer to pt to allow move to sitting ? ? ?Stairs ?  ?  ?  ?  ?  ? ? ?Wheelchair Mobility ?  ? ?Modified Rankin (Stroke Patients Only) ?  ? ? ?  ?Balance Overall balance assessment: History of Falls, Needs assistance ?Sitting-balance support: Feet supported ?Sitting balance-Leahy Scale: Fair ?  ?  ?Standing balance support: Reliant on assistive device for balance, During functional activity ?Standing balance-Leahy Scale: Poor ?  ?  ?  ?  ?  ?  ?  ?  ?  ?  ?  ?  ?  ? ?  ?  Cognition Arousal/Alertness: Awake/alert ?Behavior During Therapy: Flat affect ?Overall Cognitive Status: History of cognitive impairments -  at baseline ?  ?  ?  ?  ?  ?  ?  ?  ?  ?  ?  ?  ?  ?  ?  ?  ?General Comments: Patient oriented to self, month, states year is 2022 ?  ?  ? ?  ?Exercises   ? ?  ?General Comments   ?  ?  ? ?Pertinent Vitals/Pain Pain Assessment ?Pain Assessment: Faces ?Faces Pain Scale: Hurts even more ?Pain Location: L hip ?Pain Descriptors / Indicators: Aching ?Pain Intervention(s): Limited activity within patient's tolerance, Monitored during session, Premedicated before session  ? ? ?Home Living Family/patient expects to be discharged to:: Unsure ?  ?Available Help at Discharge: Family;Available PRN/intermittently ?Type of Home: Independent living facility ?  ?  ?  ?  ?  ?Home Equipment: Conservation officer, nature (2 wheels) ?Additional Comments: Patient stating she lives at home with DTR however chart review states patient lives at [facility]  ?  ?Prior Function    ?  ?  ?   ? ?PT Goals (current goals can now be found in the care plan section) Acute Rehab PT Goals ?Patient Stated Goal: Regain IND ?PT Goal Formulation: With patient ?Time For Goal Achievement: 10/15/21 ?Potential to Achieve Goals: Fair ?Progress towards PT goals: Progressing toward goals ? ?  ?Frequency ? ? ? Min 3X/week ? ? ? ?  ?PT Plan Current plan remains appropriate  ? ? ?Co-evaluation PT/OT/SLP Co-Evaluation/Treatment: Yes ?Reason for Co-Treatment: To address functional/ADL transfers ?PT goals addressed during session: Mobility/safety with mobility ?OT goals addressed during session: ADL's and self-care ?  ? ?  ?AM-PAC PT "6 Clicks" Mobility   ?Outcome Measure ? Help needed turning from your back to your side while in a flat bed without using bedrails?: A Lot ?Help needed moving from lying on your back to sitting on the side of a flat bed without using bedrails?: A Lot ?Help needed moving to and from a bed to a chair (including a wheelchair)?: A Lot ?Help needed standing up from a chair using your arms (e.g., wheelchair or bedside chair)?: A Lot ?Help needed to walk in  hospital room?: A Lot ?Help needed climbing 3-5 steps with a railing? : Total ?6 Click Score: 11 ? ?  ?End of Session Equipment Utilized During Treatment: Gait belt ?Activity Tolerance: Patient tolerated treatment well ?Patient left: with call bell/phone within reach;in bed;with bed alarm set;with family/visitor present ?Nurse Communication: Mobility status ?PT Visit Diagnosis: Unsteadiness on feet (R26.81);Difficulty in walking, not elsewhere classified (R26.2);Pain ?Pain - Right/Left: Left ?Pain - part of body: Hip ?  ? ? ?Time: 4665-9935 ?PT Time Calculation (min) (ACUTE ONLY): 15 min ? ?Charges:  $Therapeutic Activity: 8-22 mins          ?          ? ?Elizabeth Mccullough PT ?Acute Rehabilitation Services ?Pager [phone] ?Office [phone] ? ? ? ?Elizabeth Mccullough ?10/01/2021, 1:35 PM ? ?
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
## 2021-10-02 ENCOUNTER — Inpatient Hospital Stay (HOSPITAL_COMMUNITY): Payer: Medicare Other

## 2021-10-02 DIAGNOSIS — S72002A Fracture of unspecified part of neck of left femur, initial encounter for closed fracture: Secondary | ICD-10-CM | POA: Diagnosis not present

## 2021-10-02 DIAGNOSIS — R413 Other amnesia: Secondary | ICD-10-CM | POA: Diagnosis not present

## 2021-10-02 DIAGNOSIS — I5042 Chronic combined systolic (congestive) and diastolic (congestive) heart failure: Secondary | ICD-10-CM | POA: Diagnosis not present

## 2021-10-02 DIAGNOSIS — I7121 Aneurysm of the ascending aorta, without rupture: Secondary | ICD-10-CM | POA: Diagnosis not present

## 2021-10-02 LAB — CBC
HCT: 22.9 % — ABNORMAL LOW (ref 36.0–46.0)
Hemoglobin: 7.2 g/dL — ABNORMAL LOW (ref 12.0–15.0)
MCH: 31.6 pg (ref 26.0–34.0)
MCHC: 31.4 g/dL (ref 30.0–36.0)
MCV: 100.4 fL — ABNORMAL HIGH (ref 80.0–100.0)
Platelets: 164 10*3/uL (ref 150–400)
RBC: 2.28 MIL/uL — ABNORMAL LOW (ref 3.87–5.11)
RDW: 14.8 % (ref 11.5–15.5)
WBC: 7.6 10*3/uL (ref 4.0–10.5)
nRBC: 0 % (ref 0.0–0.2)

## 2021-10-02 LAB — BASIC METABOLIC PANEL
Anion gap: 8 (ref 5–15)
BUN: 39 mg/dL — ABNORMAL HIGH (ref 8–23)
CO2: 22 mmol/L (ref 22–32)
Calcium: 7.9 mg/dL — ABNORMAL LOW (ref 8.9–10.3)
Chloride: 106 mmol/L (ref 98–111)
Creatinine, Ser: 1.28 mg/dL — ABNORMAL HIGH (ref 0.44–1.00)
GFR, Estimated: 42 mL/min — ABNORMAL LOW (ref >60)
Glucose, Bld: 92 mg/dL (ref 70–99)
Potassium: 3.7 mmol/L (ref 3.5–5.1)
Sodium: 136 mmol/L (ref 135–145)

## 2021-10-02 LAB — RETICULOCYTES
Immature Retic Fract: 18.7 % — ABNORMAL HIGH (ref 2.3–15.9)
RBC.: 2.28 MIL/uL — ABNORMAL LOW (ref 3.87–5.11)
Retic Count, Absolute: 60 10*3/uL (ref 19.0–186.0)
Retic Ct Pct: 2.6 % (ref 0.4–3.1)

## 2021-10-02 LAB — PREPARE RBC (CROSSMATCH)

## 2021-10-02 LAB — VITAMIN B12: Vitamin B-12: 346 pg/mL (ref 180–914)

## 2021-10-02 MED ORDER — FOLIC ACID 1 MG PO TABS
1.0000 mg | ORAL_TABLET | Freq: Every day | ORAL | Status: DC
  Administered 2021-10-02 – 2021-10-05 (×4): 1 mg via ORAL
  Filled 2021-10-02 (×4): qty 1

## 2021-10-02 MED ORDER — ALBUTEROL SULFATE (2.5 MG/3ML) 0.083% IN NEBU
2.5000 mg | INHALATION_SOLUTION | Freq: Four times a day (QID) | RESPIRATORY_TRACT | Status: DC | PRN
Start: 2021-10-02 — End: 2021-10-06
  Administered 2021-10-02: 2.5 mg via RESPIRATORY_TRACT
  Filled 2021-10-02: qty 3

## 2021-10-02 MED ORDER — FUROSEMIDE 10 MG/ML IJ SOLN
60.0000 mg | Freq: Once | INTRAMUSCULAR | Status: AC
  Administered 2021-10-02: 60 mg via INTRAVENOUS
  Filled 2021-10-02: qty 6

## 2021-10-02 MED ORDER — SODIUM CHLORIDE 0.9% IV SOLUTION: Freq: Once | INTRAVENOUS | Status: DC

## 2021-10-02 MED ORDER — FUROSEMIDE 10 MG/ML IJ SOLN
60.0000 mg | Freq: Once | INTRAMUSCULAR | Status: AC
Start: 2021-10-02 — End: 2021-10-02
  Administered 2021-10-02: 60 mg via INTRAVENOUS
  Filled 2021-10-02: qty 6

## 2021-10-02 MED ORDER — ALBUTEROL SULFATE HFA 108 (90 BASE) MCG/ACT IN AERS: 1.0000 | INHALATION_SPRAY | Freq: Four times a day (QID) | RESPIRATORY_TRACT | Status: DC | PRN

## 2021-10-02 MED ORDER — VITAMIN B-12 1000 MCG PO TABS
1000.0000 ug | ORAL_TABLET | Freq: Every day | ORAL | Status: DC
  Administered 2021-10-02 – 2021-10-05 (×4): 1000 ug via ORAL
  Filled 2021-10-02 (×5): qty 1

## 2021-10-02 NOTE — Progress Notes (Signed)
Physical Therapy Treatment ?Patient Details ?Name: Elizabeth Mccullough ?MRN: [id number] ?DOB: 12/24/1940 ?Today's Date: 10/02/2021 ? ? ?History of Present Illness Patient is an 81 year old female found down on floor at ILF, admitted with L hip fracture. S/p IM 3/15. PMH:  memory deficit/dementia easily gets sundowning around 3 to 4 PM, Parkinson's disease, hypertension, chronic combined systolic diastolic CHF, anxiety/depression on Xanax, HLD, HTN, asymptomatic ascending aortic aneurysm stable at 4.2 cm for more than 5 years ? ?  ?PT Comments  ? ? Pt continues very agreeable but with limited follow through and requiring increased time and multi-modal input for all tasks.  This date, therex program initiated and pt up to side of bed sitting and then step pvt bed to chair with assist of 2 and RW.  Pt limited by cognition and parkinson.   ?Recommendations for follow up therapy are one component of a multi-disciplinary discharge planning process, led by the attending physician.  Recommendations may be updated based on patient status, additional functional criteria and insurance authorization. ? ?Follow Up Recommendations ? Skilled nursing-short term rehab (<3 hours/day) ?  ?  ?Assistance Recommended at Discharge Frequent or constant Supervision/Assistance  ?Patient can return home with the following A lot of help with walking and/or transfers;A lot of help with bathing/dressing/bathroom;Assist for transportation;Help with stairs or ramp for entrance;Assistance with cooking/housework ?  ?Equipment Recommendations ? None recommended by PT  ?  ?Recommendations for Other Services   ? ? ?  ?Precautions / Restrictions Precautions ?Precautions: Fall ?Restrictions ?Weight Bearing Restrictions: Yes ?LLE Weight Bearing: Weight bearing as tolerated ?Other Position/Activity Restrictions: WBAT for transfers  ?  ? ?Mobility ? Bed Mobility ?Overal bed mobility: Needs Assistance ?Bed Mobility: Supine to Sit ?  ?  ?Supine to sit: Mod  assist, Max assist ?  ?  ?General bed mobility comments: Increased time with cues for sequence and use of R LE to self assist but with limited follow through by pt ?  ? ?Transfers ?Overall transfer level: Needs assistance ?Equipment used: Rolling walker (2 wheels) ?Transfers: Sit to/from Stand, Bed to chair/wheelchair/BSC ?Sit to Stand: Min assist, Mod assist, +2 physical assistance, +2 safety/equipment, From elevated surface ?  ?Step pivot transfers: Min assist, Mod assist, +2 physical assistance, +2 safety/equipment ?  ?  ?  ?General transfer comment: cues for LE management and use of UEs to self assist.  Physical assist to bring wt up and fwd and to balance in intial standing with RW; Step pvt bed to chair with RW ?  ? ?Ambulation/Gait ?Ambulation/Gait assistance: +2 physical assistance, +2 safety/equipment, Min assist, Mod assist ?Gait Distance (Feet): 1 Feet ?Assistive device: Rolling walker (2 wheels) ?Gait Pattern/deviations: Step-to pattern, Decreased step length - right, Decreased step length - left, Shuffle, Trunk flexed ?Gait velocity: decr ?  ?  ?General Gait Details: cues for posture, position from RW and sequence.  Pt performed step pvt with RW and chair pulled up behind ? ? ?Stairs ?  ?  ?  ?  ?  ? ? ?Wheelchair Mobility ?  ? ?Modified Rankin (Stroke Patients Only) ?  ? ? ?  ?Balance Overall balance assessment: History of Falls, Needs assistance ?Sitting-balance support: Feet supported ?Sitting balance-Leahy Scale: Fair ?  ?  ?Standing balance support: Reliant on assistive device for balance, During functional activity ?Standing balance-Leahy Scale: Poor ?  ?  ?  ?  ?  ?  ?  ?  ?  ?  ?  ?  ?  ? ?  ?  Cognition Arousal/Alertness: Awake/alert ?Behavior During Therapy: Flat affect ?Overall Cognitive Status: History of cognitive impairments - at baseline ?  ?  ?  ?  ?  ?  ?  ?  ?  ?  ?  ?  ?  ?  ?  ?  ?  ?  ?  ? ?  ?Exercises General Exercises - Lower Extremity ?Ankle Circles/Pumps: AAROM, Both, 15 reps,  Supine ?Heel Slides: AAROM, Left, 15 reps, Supine ?Hip ABduction/ADduction: AAROM, Left, 15 reps, Supine ? ?  ?General Comments   ?  ?  ? ?Pertinent Vitals/Pain Pain Assessment ?Pain Assessment: Faces ?Faces Pain Scale: Hurts little more  ? ? ?Home Living   ?  ?  ?  ?  ?  ?  ?  ?  ?  ?   ?  ?Prior Function    ?  ?  ?   ? ?PT Goals (current goals can now be found in the care plan section) Acute Rehab PT Goals ?Patient Stated Goal: Regain IND ?PT Goal Formulation: With patient ?Time For Goal Achievement: 10/15/21 ?Potential to Achieve Goals: Fair ?Progress towards PT goals: Progressing toward goals ? ?  ?Frequency ? ? ? Min 3X/week ? ? ? ?  ?PT Plan Current plan remains appropriate  ? ? ?Co-evaluation   ?  ?  ?  ?  ? ?  ?AM-PAC PT "6 Clicks" Mobility   ?Outcome Measure ? Help needed turning from your back to your side while in a flat bed without using bedrails?: A Lot ?Help needed moving from lying on your back to sitting on the side of a flat bed without using bedrails?: A Lot ?Help needed moving to and from a bed to a chair (including a wheelchair)?: A Lot ?Help needed standing up from a chair using your arms (e.g., wheelchair or bedside chair)?: A Lot ?Help needed to walk in hospital room?: Total ?Help needed climbing 3-5 steps with a railing? : Total ?6 Click Score: 10 ? ?  ?End of Session Equipment Utilized During Treatment: Gait belt ?Activity Tolerance: Patient tolerated treatment well ?Patient left: in chair;with call bell/phone within reach;with chair alarm set;with nursing/sitter in room ?Nurse Communication: Mobility status ?PT Visit Diagnosis: Unsteadiness on feet (R26.81);Difficulty in walking, not elsewhere classified (R26.2);Pain ?Pain - Right/Left: Left ?Pain - part of body: Hip ?  ? ? ?Time: 1779-3903 ?PT Time Calculation (min) (ACUTE ONLY): 25 min ? ?Charges:  $Therapeutic Exercise: 8-22 mins ?$Therapeutic Activity: 8-22 mins          ?          ? ?Debe Coder PT ?Acute Rehabilitation  Services ?Pager [phone] ?Office [phone] ? ? ? ?Tashana Haberl ?10/02/2021, 1:31 PM ? ?

## 2021-10-02 NOTE — TOC Progression Note (Signed)
Transition of Care (TOC) - Progression Note  ? ? ?Patient Details  ?Name: TAWSHA TERRERO ?MRN: [id number] ?Date of Birth: September 01, 1940 ? ?Transition of Care (TOC) CM/SW Contact  ?Ladislav Caselli, LCSW ?Phone Number: ?10/02/2021, 1:23 PM ? ?Clinical Narrative:    ?At this time, awaiting Level 2 PASRR clearance. FL2 out to [facility] (daughter's preferred facility) and other local SNFs.   ? ? ?Expected Discharge Plan: [place] ?Barriers to Discharge: Continued Medical Work up, Ship broker, SNF Pending bed offer ? ?Expected Discharge Plan and Services ?Expected Discharge Plan: [place] ?In-house Referral: Clinical Social Work ?  ?Post Acute Care Choice: [place] ?Living arrangements for the past 2 months: [facility]) ?                ?DME Arranged: N/A ?DME Agency: NA ?  ?  ?  ?  ?  ?  ?  ?  ? ? ?Social Determinants of Health (SDOH) Interventions ?  ? ?Readmission Risk Interventions ?Readmission Risk Prevention Plan 10/01/2021  ?Post Dischage Appt Complete  ?Medication Screening Complete  ?Transportation Screening Complete  ?Some recent data might be hidden  ? ? ?

## 2021-10-02 NOTE — Progress Notes (Addendum)
?PROGRESS NOTE ? ? ? ?[name]  OJJ:[id number] DOB: 14-May-1941 DOA: 09/29/2021 ?PCP: Ann Held, DO  ? ? ? ?Brief Narrative:  ?81 year old female from independent living facility with history of memory deficit/dementia easily gets sundowning around 3 to 4 PM, Parkinson's disease, hypertension, chronic combined systolic diastolic CHF, anxiety/depression on Xanax, HLD, HTN, asymptomatic ascending aortic aneurysm stable at 4.2 cm for more than 5 years brought to the ED after she was found on the floor of the intimate living facility.  Patient reports that she slipped and fell was down for "20 minutes".  She was having left hip pain was [facility] found to have left intertrochanteric hip fracture and was sent to the ED for evaluation and admission. Last echo 10/21 with EF 30-35%, G1 DD, moderate to severe AR. ?In the ED vitals stable, labs showed stable CBC BMP.  Chest x-ray clear ascending thoracic aortic aneurysm again noted.  X-ray of the hip displaced and comminuted intertrochanteric fracture of the left hip, CT of the hip with severely comminuted left intertrochanteric fracture with apex superolateral angulation, intramuscular hematoma in the left gluteus medius muscle. ?ED physician informed me that  patient is scheduled for surgery tomorrow by Dr. Lyla Glassing and admission was requested for further management. ?On my evaluation at bedside patient is mildly confused, daughter notes that around 3 to 4 PM she gets confused normally and has baseline anxiety.  Patient denies any pain no leg edema. ? ? ?Subjective: ? ?POD#2, no fever ?Hgb 7.2 ?I heard cough this am,  ?Appear weak, pleasantly confused,. She reports pain with movement, denies pain at rest ?Daughter at bedside  ? ?Assessment & Plan: ? Principal Problem: ?  Closed left hip fracture ([place]) ?Active Problems: ?  Hyperlipidemia ?  Parkinson disease ([place]) ?  Chronic combined systolic and diastolic CHF (congestive heart  failure) ([place]) ?  NICM (nonischemic cardiomyopathy) ([place]) ?  Aneurysm of ascending aorta ?  Memory deficit ?  Anxiety and depression ?  Essential hypertension ? ? ? ?Assessment and Plan: ? ? ?Fever 100.4 on postop day 1 ?No leukocytosis, blood culture no growth,Chest x-ray no acute findings ? UA positive leuk, negative nitrite ?urine culture in process, start empiric antibiotic ? ?Closed left hip fracture/Comminuted Left peritrochanteric femur fracture ?Patient is from a facility, reports that she slipped and fell was down for "20 minutes".  ?S/p Intramedullary fixation, Left femur on 3/15 by Dr. Lyla Glassing ?-Postop pain management, wound care, weightbearing status, DVT prophylaxis per Ortho, appreciate Ortho input ? ?Acute anemia ?S/p prbc transfusion x1 prior to going to OR ?Does not appear to have acute external bleed, RN reports stool is light brown, FOBT pending collection ?Hgb 7.2 on 3/17, will give another prbc ?Start H37 and folic acid supplement ? recheck cbc in am ? ?AKI on CKDII ?Cr continue to trend up today is 1.28 ?Renal dosing meds, hold acei ?On antibiotic for possible UTI ?Hold hydration, gave IV Lasix today as she appear volume overloaded today on 3/17 ? ?Acute on Chronic combined CHF/NICM  ?Repeat echo EF 40% to 16%, grade 1 diastolic dysfunction ?Continue beta-blocker,  ?Continue hold ACE inhibitor  ?Give iv lasix as she appears volume overloaded today with cough, crackles on exam, though no significant edema ?Close monitor volume status ? ?Hypertension ?Continue beta-blocker, hold ACE inhibitor  ?Monitor blood pressure and volume status ? ?Aneurysm of ascending aorta ?she was previously seen by CT surgery appeared present for more than 5 years and  felt to be stable.  Currently denies chest pain ? ?Hyperlipidemia ?Continue statin ? ?Parkinson's disease ?memory deficit/dementia  ?Anxiety/depression ?daughter reports she has mild dementia, easily gets sundowning around 3 to 4 PM,  ?She is on chronic  Xanax resume, will need to use as needed medication for agitation ? ?Daughter does not think patient will have bad delirium in the hospital, will monitor, delirium prevention protocol order placed ?She is pleasantly confused today during encounter  ? ? ?: Body mass index is 26.21 kg/m?Marland Kitchen. ?  ? ? ? ?I have Reviewed nursing notes, Vitals, pain scores, I/o's, Lab results and  imaging results since pt's last encounter, details please see discussion above  ?I ordered the following labs:  ?Unresulted Labs (From admission, onward)  ? ?  Start     Ordered  ? 10/07/21 0500  Creatinine, serum  (enoxaparin (LOVENOX)  CrCl < 30 mL/min)  Weekly,   R     ?Comments: while on enoxaparin therapy. ?  ? 09/30/21 1550  ? 10/03/21 7829  Basic metabolic panel  Tomorrow morning,   R       ? 10/02/21 0821  ? 10/01/21 0735  Culture, blood (routine x 2)  BLOOD CULTURE X 2,   R (with TIMED occurrences)     ? 10/01/21 0734  ? 10/01/21 0734  Urine Culture  Once,   R       ?Question:  Indication  Answer:  Altered mental status (if no other cause identified)  ? 10/01/21 0734  ? 10/01/21 0500  CBC  Daily,   R     ? 09/30/21 1550  ? Unscheduled  Occult blood card to lab, stool  As needed,   R     ? 10/01/21 1455  ? ?  ?  ? ?  ? ? ? ?DVT prophylaxis: enoxaparin (LOVENOX) injection 30 mg Start: 10/01/21 0800 ?SCDs Start: 09/30/21 1551 ? ? ?Code Status:   Code Status: DNR ? ?Family Communication: daughters daily ?Disposition:  ? ?Status is: Inpatient ? ? Dispo: The patient is from: independent living  ?             Anticipated d/c is to: snf  ?             Anticipated d/c date is: snf, family prefers [facility] [place], likely sometime early next week ? ?Antimicrobials:   ?Anti-infectives (From admission, onward)  ? ? Start     Dose/Rate Route Frequency Ordered Stop  ? 10/01/21 1800  cefTRIAXone (ROCEPHIN) 1 g in sodium chloride 0.9 % 100 mL IVPB       ? 1 g ?200 mL/hr over 30 Minutes Intravenous Every 24 hours 10/01/21 1630    ? 10/01/21 1715   cefTRIAXone (ROCEPHIN) 1 g in sodium chloride 0.9 % 100 mL IVPB  Status:  Discontinued       ? 1 g ?200 mL/hr over 30 Minutes Intravenous Every 24 hours 10/01/21 1628 10/01/21 1629  ? 09/30/21 1845  ceFAZolin (ANCEF) IVPB 2g/100 mL premix       ? 2 g ?200 mL/hr over 30 Minutes Intravenous Every 6 hours 09/30/21 1550 10/01/21 0019  ? 09/30/21 1045  ceFAZolin (ANCEF) IVPB 2g/100 mL premix       ? 2 g ?200 mL/hr over 30 Minutes Intravenous On call to O.R. 09/30/21 0722 09/30/21 1251  ? ?  ? ? ? ? ? ? ?Objective: ?Vitals:  ? 10/01/21 1203 10/01/21 1800 10/01/21 2119 10/02/21 0601  ?BP: Marland Kitchen)  91/38 (!) 88/52 (!) 108/54 (!) 124/105  ?Pulse: 65 84 82 70  ?Resp: '19 20 18 18  '$ ?Temp: 97.7 ?F (36.5 ?C)  98.9 ?F (37.2 ?C) 98.1 ?F (36.7 ?C)  ?TempSrc:   Oral Oral  ?SpO2: 94%  98% 94%  ?Weight:      ?Height:      ? ? ?Intake/Output Summary (Last 24 hours) at 10/02/2021 1010 ?Last data filed at 10/02/2021 0850 ?Gross per 24 hour  ?Intake 1954.56 ml  ?Output 820 ml  ?Net 1134.56 ml  ? ?Filed Weights  ? 09/29/21 1254 09/30/21 0500  ?Weight: 54.9 kg 65 kg  ? ? ?Examination: ? ?General exam: Frail, alert, communicative, pleasantly confused  ?Respiratory system:  Mild bibasilar crackles, Respiratory effort normal. ?Cardiovascular system:  RRR.  ?Gastrointestinal system: Abdomen is nondistended, soft and nontender.  Normal bowel sounds heard. ?Central nervous system: only oriented to person but calm and cooperative ?Extremities: Postop changes left lower extremity ?Skin: No rashes, lesions or ulcers ?Psychiatry: calm, no agitation.  ? ? ? ?Data Reviewed: I have personally reviewed  labs and visualized  imaging studies since the last encounter and formulate the plan  ? ? ? ? ? ? ?Scheduled Meds: ? sodium chloride   Intravenous Once  ? bisoprolol  2.5 mg Oral Daily  ? Carbidopa-Levodopa ER  2 tablet Oral TID  ? Chlorhexidine Gluconate Cloth  6 each Topical Daily  ? docusate sodium  100 mg Oral BID  ? enoxaparin (LOVENOX) injection  30 mg  Subcutaneous L75P  ? folic acid  1 mg Oral Daily  ? pravastatin  10 mg Oral QHS  ? senna  1 tablet Oral BID  ? sertraline  100 mg Oral Daily  ? vitamin B-12  1,000 mcg Oral Daily  ? ?Continuous Infusions: ?

## 2021-10-02 NOTE — Care Management Important Message (Signed)
Important Message ? ?Patient Details IM Letter placed in Patients room ?Name: Elizabeth Mccullough ?MRN: [id number] ?Date of Birth: 1940/08/20 ? ? ?Medicare Important Message Given:  Yes ? ? ? ? ?Kerin Salen ?10/02/2021, 2:17 PM ?

## 2021-10-03 DIAGNOSIS — S72002A Fracture of unspecified part of neck of left femur, initial encounter for closed fracture: Secondary | ICD-10-CM | POA: Diagnosis not present

## 2021-10-03 DIAGNOSIS — I5042 Chronic combined systolic (congestive) and diastolic (congestive) heart failure: Secondary | ICD-10-CM | POA: Diagnosis not present

## 2021-10-03 DIAGNOSIS — R413 Other amnesia: Secondary | ICD-10-CM | POA: Diagnosis not present

## 2021-10-03 DIAGNOSIS — I7121 Aneurysm of the ascending aorta, without rupture: Secondary | ICD-10-CM | POA: Diagnosis not present

## 2021-10-03 LAB — CBC
HCT: 24.3 % — ABNORMAL LOW (ref 36.0–46.0)
Hemoglobin: 7.9 g/dL — ABNORMAL LOW (ref 12.0–15.0)
MCH: 31.5 pg (ref 26.0–34.0)
MCHC: 32.5 g/dL (ref 30.0–36.0)
MCV: 96.8 fL (ref 80.0–100.0)
Platelets: 205 10*3/uL (ref 150–400)
RBC: 2.51 MIL/uL — ABNORMAL LOW (ref 3.87–5.11)
RDW: 14.7 % (ref 11.5–15.5)
WBC: 6 10*3/uL (ref 4.0–10.5)
nRBC: 0 % (ref 0.0–0.2)

## 2021-10-03 LAB — BPAM RBC
Blood Product Expiration Date: 202303202359
Blood Product Expiration Date: 202304032359
ISSUE DATE / TIME: 202303151056
ISSUE DATE / TIME: 202303171049
Unit Type and Rh: 600
Unit Type and Rh: 6200

## 2021-10-03 LAB — TYPE AND SCREEN
ABO/RH(D): A POS
Antibody Screen: NEGATIVE
Unit division: 0
Unit division: 0

## 2021-10-03 LAB — URINE CULTURE: Culture: NO GROWTH

## 2021-10-03 LAB — BASIC METABOLIC PANEL
Anion gap: 10 (ref 5–15)
BUN: 36 mg/dL — ABNORMAL HIGH (ref 8–23)
CO2: 25 mmol/L (ref 22–32)
Calcium: 8 mg/dL — ABNORMAL LOW (ref 8.9–10.3)
Chloride: 103 mmol/L (ref 98–111)
Creatinine, Ser: 1.06 mg/dL — ABNORMAL HIGH (ref 0.44–1.00)
GFR, Estimated: 52 mL/min — ABNORMAL LOW (ref >60)
Glucose, Bld: 91 mg/dL (ref 70–99)
Potassium: 3.2 mmol/L — ABNORMAL LOW (ref 3.5–5.1)
Sodium: 138 mmol/L (ref 135–145)

## 2021-10-03 MED ORDER — POTASSIUM CHLORIDE 20 MEQ PO PACK
40.0000 meq | PACK | ORAL | Status: AC
  Administered 2021-10-03 (×2): 40 meq via ORAL
  Filled 2021-10-03 (×2): qty 2

## 2021-10-03 MED ORDER — CEPHALEXIN 500 MG PO CAPS
500.0000 mg | ORAL_CAPSULE | Freq: Two times a day (BID) | ORAL | Status: DC
  Administered 2021-10-03 – 2021-10-04 (×3): 500 mg via ORAL
  Filled 2021-10-03 (×3): qty 1

## 2021-10-03 MED ORDER — FUROSEMIDE 10 MG/ML IJ SOLN
60.0000 mg | Freq: Once | INTRAMUSCULAR | Status: AC
  Administered 2021-10-03: 60 mg via INTRAVENOUS
  Filled 2021-10-03: qty 6

## 2021-10-03 MED ORDER — FUROSEMIDE 10 MG/ML IJ SOLN
60.0000 mg | Freq: Every day | INTRAMUSCULAR | Status: AC
  Administered 2021-10-04 – 2021-10-05 (×2): 60 mg via INTRAVENOUS
  Filled 2021-10-03 (×2): qty 6

## 2021-10-03 NOTE — Plan of Care (Signed)

## 2021-10-03 NOTE — TOC Progression Note (Signed)
Transition of Care (TOC) - Progression Note  ? ? ?Patient Details  ?Name: Elizabeth Mccullough ?MRN: [id number] ?Date of Birth: July 29, 1940 ? ?Transition of Care (TOC) CM/SW Contact  ?Ross Ludwig, LCSW ?Phone Number: ?10/03/2021, 3:07 PM ? ?Clinical Narrative:    ? ?[facility] number still pending, patient's daughter prefers [facility] [place].  CSW contacted [place] and they are still reviewing patient and will let TOC know once decision has been made. ? ? ?Expected Discharge Plan: [place] ?Barriers to Discharge: Continued Medical Work up, Ship broker, SNF Pending bed offer ? ?Expected Discharge Plan and Services ?Expected Discharge Plan: [place] ?In-house Referral: Clinical Social Work ?  ?Post Acute Care Choice: [place] ?Living arrangements for the past 2 months: [facility]) ?                ?DME Arranged: N/A ?DME Agency: NA ?  ?  ?  ?  ?  ?  ?  ?  ? ? ?Social Determinants of Health (SDOH) Interventions ?  ? ?Readmission Risk Interventions ?Readmission Risk Prevention Plan 10/01/2021  ?Post Dischage Appt Complete  ?Medication Screening Complete  ?Transportation Screening Complete  ?Some recent data might be hidden  ? ? ?

## 2021-10-03 NOTE — Plan of Care (Signed)

## 2021-10-03 NOTE — Plan of Care (Signed)
  Problem: Education: Goal: Knowledge of General Education information will improve Description Including pain rating scale, medication(s)/side effects and non-pharmacologic comfort measures Outcome: Progressing   

## 2021-10-03 NOTE — Progress Notes (Signed)
Physical Therapy Treatment ?Patient Details ?Name: Elizabeth Mccullough ?MRN: [id number] ?DOB: [birth year] ?Today's Date: 10/03/2021 ? ? ?History of Present Illness Patient is an 81 year old female found down on floor at ILF, admitted with L hip fracture. S/p IM 3/15. PMH:  memory deficit/dementia easily gets sundowning around 3 to 4 PM, Parkinson's disease, hypertension, chronic combined systolic diastolic CHF, anxiety/depression on Xanax, HLD, HTN, asymptomatic ascending aortic aneurysm stable at 4.2 cm for more than 5 years ? ?  ?PT Comments  ? ? Pt continues very agreeable but struggles with follow through on cues.  Pt up from recliner to step pvt to bedside but with noted increased shuffling of feet vs stepping.  Pt returned safely to bed and positioned for comfort.  ?Recommendations for follow up therapy are one component of a multi-disciplinary discharge planning process, led by the attending physician.  Recommendations may be updated based on patient status, additional functional criteria and insurance authorization. ? ?Follow Up Recommendations ? Skilled nursing-short term rehab (<3 hours/day) ?  ?  ?Assistance Recommended at Discharge Frequent or constant Supervision/Assistance  ?Patient can return home with the following A lot of help with walking and/or transfers;A lot of help with bathing/dressing/bathroom;Assist for transportation;Help with stairs or ramp for entrance;Assistance with cooking/housework ?  ?Equipment Recommendations ? None recommended by PT  ?  ?Recommendations for Other Services   ? ? ?  ?Precautions / Restrictions Precautions ?Precautions: Fall ?Restrictions ?Weight Bearing Restrictions: No ?LLE Weight Bearing: Weight bearing as tolerated ?Other Position/Activity Restrictions: WBAT for transfers only  ?  ? ?Mobility ? Bed Mobility ?Overal bed mobility: Needs Assistance ?Bed Mobility: Sit to Supine ?  ?  ?Supine to sit: Mod assist, Max assist ?Sit to supine: Max assist, +2 for physical  assistance, +2 for safety/equipment ?  ?General bed mobility comments: Increased time with cues for sequence and use of R LE to self assist but with limited follow through by pt ?  ? ?Transfers ?Overall transfer level: Needs assistance ?Equipment used: Rolling walker (2 wheels) ?Transfers: Sit to/from Stand, Bed to chair/wheelchair/BSC ?Sit to Stand: Mod assist, +2 physical assistance, +2 safety/equipment, From elevated surface ?  ?Step pivot transfers: Min assist, Mod assist, +2 physical assistance, +2 safety/equipment ?  ?  ?  ?General transfer comment: cues for LE management and use of UEs to self assist.  Physical assist to bring wt up and fwd and to balance in intial standing with RW; Step pvt bed to chair with RW; ?  ? ?Ambulation/Gait ?Ambulation/Gait assistance: +2 physical assistance, +2 safety/equipment, Min assist, Mod assist ?Gait Distance (Feet): 2 Feet ?Assistive device: Rolling walker (2 wheels) ?Gait Pattern/deviations: Step-to pattern, Decreased step length - right, Decreased step length - left, Shuffle, Trunk flexed ?Gait velocity: decr ?  ?  ?General Gait Details: Step pvt chair to bed only ? ? ?Stairs ?  ?  ?  ?  ?  ? ? ?Wheelchair Mobility ?  ? ?Modified Rankin (Stroke Patients Only) ?  ? ? ?  ?Balance Overall balance assessment: History of Falls, Needs assistance ?Sitting-balance support: Feet supported ?Sitting balance-Leahy Scale: Fair ?  ?  ?Standing balance support: Reliant on assistive device for balance, During functional activity ?Standing balance-Leahy Scale: Poor ?  ?  ?  ?  ?  ?  ?  ?  ?  ?  ?  ?  ?  ? ?  ?Cognition Arousal/Alertness: Awake/alert ?Behavior During Therapy: Flat affect ?Overall Cognitive Status: History of cognitive impairments - at  baseline ?  ?  ?  ?  ?  ?  ?  ?  ?  ?  ?  ?  ?  ?  ?  ?  ?  ?  ?  ? ?  ?Exercises General Exercises - Lower Extremity ?Ankle Circles/Pumps: AAROM, Both, 15 reps, Supine ?Heel Slides: AAROM, Left, 15 reps, Supine ?Hip ABduction/ADduction:  AAROM, Left, 15 reps, Supine ? ?  ?General Comments   ?  ?  ? ?Pertinent Vitals/Pain Pain Assessment ?Pain Assessment: Faces ?Faces Pain Scale: Hurts little more ?Pain Location: L hip ?Pain Descriptors / Indicators: Sore ?Pain Intervention(s): Limited activity within patient's tolerance, Monitored during session, Premedicated before session, Ice applied  ? ? ?Home Living   ?  ?  ?  ?  ?  ?  ?  ?  ?  ?   ?  ?Prior Function    ?  ?  ?   ? ?PT Goals (current goals can now be found in the care plan section) Acute Rehab PT Goals ?Patient Stated Goal: Regain IND ?PT Goal Formulation: With patient ?Time For Goal Achievement: 10/15/21 ?Potential to Achieve Goals: Fair ?Progress towards PT goals: Not progressing toward goals - comment ? ?  ?Frequency ? ? ? Min 3X/week ? ? ? ?  ?PT Plan Current plan remains appropriate  ? ? ?Co-evaluation   ?  ?  ?  ?  ? ?  ?AM-PAC PT "6 Clicks" Mobility   ?Outcome Measure ? Help needed turning from your back to your side while in a flat bed without using bedrails?: A Lot ?Help needed moving from lying on your back to sitting on the side of a flat bed without using bedrails?: A Lot ?Help needed moving to and from a bed to a chair (including a wheelchair)?: A Lot ?Help needed standing up from a chair using your arms (e.g., wheelchair or bedside chair)?: A Lot ?Help needed to walk in hospital room?: Total ?Help needed climbing 3-5 steps with a railing? : Total ?6 Click Score: 10 ? ?  ?End of Session Equipment Utilized During Treatment: Gait belt ?Activity Tolerance: Patient tolerated treatment well ?Patient left: in chair;with call bell/phone within reach;with chair alarm set;with nursing/sitter in room ?Nurse Communication: Mobility status ?PT Visit Diagnosis: Unsteadiness on feet (R26.81);Difficulty in walking, not elsewhere classified (R26.2);Pain ?Pain - Right/Left: Left ?Pain - part of body: Hip ?  ? ? ?Time: 2482-5003 ?PT Time Calculation (min) (ACUTE ONLY): 14 min ? ?Charges:   $Therapeutic Exercise: 8-22 mins ?$Therapeutic Activity: 8-22 mins          ?          ? ?Debe Coder PT ?Acute Rehabilitation Services ?Pager [phone] ?Office [phone] ? ? ? ?[place] ?10/03/2021, 4:53 PM ? ?

## 2021-10-03 NOTE — Progress Notes (Signed)
?PROGRESS NOTE ? ? ? ?Elizabeth Mccullough  TKW:[id number] DOB: 01/15/1941 DOA: 09/29/2021 ?PCP: Ann Held, DO  ? ? ? ?Brief Narrative:  ?81 year old female from independent living facility with history of memory deficit/dementia easily gets sundowning around 3 to 4 PM, Parkinson's disease, hypertension, chronic combined systolic diastolic CHF, anxiety/depression on Xanax, HLD, HTN, asymptomatic ascending aortic aneurysm stable at 4.2 cm for more than 5 years brought to the ED after she was found on the floor of the intimate living facility.  Patient reports that she slipped and fell was down for "20 minutes".  She was having left hip pain was [facility] found to have left intertrochanteric hip fracture and was sent to the ED for evaluation and admission. Last echo 10/21 with EF 30-35%, G1 DD, moderate to severe AR. ?In the ED vitals stable, labs showed stable CBC BMP.  Chest x-ray clear ascending thoracic aortic aneurysm again noted.  X-ray of the hip displaced and comminuted intertrochanteric fracture of the left hip, CT of the hip with severely comminuted left intertrochanteric fracture with apex superolateral angulation, intramuscular hematoma in the left gluteus medius muscle. ?ED physician informed me that  patient is scheduled for surgery tomorrow by Dr. Lyla Glassing and admission was requested for further management. ?On my evaluation at bedside patient is mildly confused, daughter notes that around 3 to 4 PM she gets confused normally and has baseline anxiety.  Patient denies any pain no leg edema. ? ? ?Subjective: ? ?POD#3, no fever ?Hgb 7.9,  ? ?Appear weak, pleasantly confused,. ?Awaiting for snf placement  ?Daughter at bedside  ? ?Assessment & Plan: ? Principal Problem: ?  Closed left hip fracture ([place]) ?Active Problems: ?  Hyperlipidemia ?  Parkinson disease ([place]) ?  Chronic combined systolic and diastolic CHF (congestive heart failure) ([place]) ?  NICM (nonischemic  cardiomyopathy) ([place]) ?  Aneurysm of ascending aorta ?  Memory deficit ?  Anxiety and depression ?  Essential hypertension ? ? ? ?Assessment and Plan: ? ? ?Fever 100.4 on postop day 1 ?No leukocytosis, blood culture no growth,Chest x-ray no acute findings ? UA positive leuk, negative nitrite, urine culture no growth ? start rocephin empirically, change to keflex to finish total of 5 days treatment  ? ?Closed left hip fracture/Comminuted Left peritrochanteric femur fracture ?Patient is from a facility, reports that she slipped and fell was down for "20 minutes".  ?S/p Intramedullary fixation, Left femur on 3/15 by Dr. Lyla Glassing ?-Postop pain management, wound care, weightbearing status, DVT prophylaxis per Ortho, appreciate Ortho input ? ?Acute anemia ?S/p prbc transfusion x1 prior to going to OR ?Does not appear to have acute external bleed, RN reports stool is light brown, FOBT pending collection ?Hgb 7.2 on 3/17,  another prbc given ?Start J24 and folic acid supplement ? recheck cbc in am ? ?AKI on CKDII ?Cr peaked at 1.28 ?Renal dosing meds, hold acei ?On antibiotic for possible UTI ?Cr improving , likely able to resume acei soon ? ?Acute on Chronic combined CHF/NICM  ?Repeat echo EF 40% to 26%, grade 1 diastolic dysfunction ?Continue beta-blocker,  ?Continue hold ACE inhibitor ?Cough and wheezing improved with Iv lasix , cxr no acute findings, there is no edema on exam, ?Continue iv lasix for another two days, then transition back to home oral lasix ?Close monitor volume status ? ?Hypertension ?Continue beta-blocker, hold ACE inhibitor due to aki, likely able to resume soon ?Monitor blood pressure and volume status ? ?Aneurysm of ascending aorta ?  she was previously seen by CT surgery appeared present for more than 5 years and felt to be stable.  Currently denies chest pain ? ?Hyperlipidemia ?Continue statin ? ?Parkinson's disease ?memory deficit/dementia  ?Anxiety/depression ?daughter reports she has mild  dementia, easily gets sundowning around 3 to 4 PM,  ?She is on chronic Xanax resume, will need to use as needed medication for agitation ? ?Daughter does not think patient will have bad delirium in the hospital, will monitor, delirium prevention protocol order placed ?She is pleasantly confused today during encounter  ? ? ?: Body mass index is 26.21 kg/m?Marland Kitchen. ?  ? ? ? ?I have Reviewed nursing notes, Vitals, pain scores, I/o's, Lab results and  imaging results since pt's last encounter, details please see discussion above  ?I ordered the following labs:  ?Unresulted Labs (From admission, onward)  ? ?  Start     Ordered  ? 10/07/21 0500  Creatinine, serum  (enoxaparin (LOVENOX)  CrCl < 30 mL/min)  Weekly,   R     ?Comments: while on enoxaparin therapy. ?  ? 09/30/21 1550  ? 10/04/21 0500  CBC with Differential/Platelet  Tomorrow morning,   R       ? 10/03/21 0806  ? 10/04/21 1700  Basic metabolic panel  Tomorrow morning,   R       ? 10/03/21 0806  ? 10/04/21 0500  Magnesium  Tomorrow morning,   STAT       ? 10/03/21 0806  ? 10/01/21 0735  Culture, blood (routine x 2)  BLOOD CULTURE X 2,   R (with TIMED occurrences)     ? 10/01/21 0734  ? Unscheduled  Occult blood card to lab, stool  As needed,   R     ? 10/01/21 1455  ? ?  ?  ? ?  ? ? ? ?DVT prophylaxis: enoxaparin (LOVENOX) injection 30 mg Start: 10/01/21 0800 ?SCDs Start: 09/30/21 1551 ? ? ?Code Status:   Code Status: DNR ? ?Family Communication: daughters daily ?Disposition:  ? ?Status is: Inpatient ? ? Dispo: The patient is from: independent living  ?             Anticipated d/c is to: snf  ?             Anticipated d/c date is: snf, family prefers [facility] in [place], likely sometime early next week ? ?Antimicrobials:   ?Anti-infectives (From admission, onward)  ? ? Start     Dose/Rate Route Frequency Ordered Stop  ? 10/01/21 1800  cefTRIAXone (ROCEPHIN) 1 g in sodium chloride 0.9 % 100 mL IVPB       ? 1 g ?200 mL/hr over 30 Minutes Intravenous Every 24 hours  10/01/21 1630    ? 10/01/21 1715  cefTRIAXone (ROCEPHIN) 1 g in sodium chloride 0.9 % 100 mL IVPB  Status:  Discontinued       ? 1 g ?200 mL/hr over 30 Minutes Intravenous Every 24 hours 10/01/21 1628 10/01/21 1629  ? 09/30/21 1845  ceFAZolin (ANCEF) IVPB 2g/100 mL premix       ? 2 g ?200 mL/hr over 30 Minutes Intravenous Every 6 hours 09/30/21 1550 10/01/21 0019  ? 09/30/21 1045  ceFAZolin (ANCEF) IVPB 2g/100 mL premix       ? 2 g ?200 mL/hr over 30 Minutes Intravenous On call to O.R. 09/30/21 0722 09/30/21 1251  ? ?  ? ? ? ? ? ? ?Objective: ?Vitals:  ? 10/02/21 1422 10/02/21 1444 10/02/21  2122 10/03/21 2703  ?BP: 138/74  138/64 (!) 159/64  ?Pulse: 84  81 76  ?Resp: '20  18 14  '$ ?Temp: 98 ?F (36.7 ?C)  99 ?F (37.2 ?C) 97.7 ?F (36.5 ?C)  ?TempSrc: Oral  Oral Oral  ?SpO2: 95% 95% 95% 94%  ?Weight:      ?Height:      ? ? ?Intake/Output Summary (Last 24 hours) at 10/03/2021 1111 ?Last data filed at 10/03/2021 0600 ?Gross per 24 hour  ?Intake 842 ml  ?Output 2000 ml  ?Net -1158 ml  ? ?Filed Weights  ? 09/29/21 1254 09/30/21 0500  ?Weight: 54.9 kg 65 kg  ? ? ?Examination: ? ?General exam: Frail, alert, communicative, pleasantly confused  ?Respiratory system:  Mild diffuse wheezing, Respiratory effort normal. ?Cardiovascular system:  RRR.  ?Gastrointestinal system: Abdomen is nondistended, soft and nontender.  Normal bowel sounds heard. ?Central nervous system: only oriented to person but calm and cooperative ?Extremities: Postop changes left lower extremity, no edema ?Skin: No rashes, lesions or ulcers ?Psychiatry: calm, no agitation.  ? ? ? ?Data Reviewed: I have personally reviewed  labs and visualized  imaging studies since the last encounter and formulate the plan  ? ? ? ? ? ? ?Scheduled Meds: ? sodium chloride   Intravenous Once  ? bisoprolol  2.5 mg Oral Daily  ? Carbidopa-Levodopa ER  2 tablet Oral TID  ? Chlorhexidine Gluconate Cloth  6 each Topical Daily  ? docusate sodium  100 mg Oral BID  ? enoxaparin (LOVENOX)  injection  30 mg Subcutaneous J00X  ? folic acid  1 mg Oral Daily  ? potassium chloride  40 mEq Oral Q4H  ? pravastatin  10 mg Oral QHS  ? senna  1 tablet Oral BID  ? sertraline  100 mg Oral Daily  ? vitamin B-12  1,00

## 2021-10-03 NOTE — Progress Notes (Signed)
Physical Therapy Treatment ?Patient Details ?Name: Elizabeth Mccullough ?MRN: [id number] ?DOB: 05-31-1941 ?Today's Date: 10/03/2021 ? ? ?History of Present Illness Patient is an 81 year old female found down on floor at [facility]F, admitted with L hip fracture. S/p IM 3/15. PMH:  memory deficit/dementia easily gets sundowning around 3 to 4 PM, Parkinson's disease, hypertension, chronic combined systolic diastolic CHF, anxiety/depression on Xanax, HLD, HTN, asymptomatic ascending aortic aneurysm stable at 4.2 cm for more than 5 years ? ?  ?PT Comments  ? ? Pt continues agreeable and progressing slowly with mobility but with improvement noted in participation level with therex and improved ability to step around and backwards with transfers to chair.   ?Recommendations for follow up therapy are one component of a multi-disciplinary discharge planning process, led by the attending physician.  Recommendations may be updated based on patient status, additional functional criteria and insurance authorization. ? ?Follow Up Recommendations ? Skilled nursing-short term rehab (<3 hours/day) ?  ?  ?Assistance Recommended at Discharge Frequent or constant Supervision/Assistance  ?Patient can return home with the following A lot of help with walking and/or transfers;A lot of help with bathing/dressing/bathroom;Assist for transportation;Help with stairs or ramp for entrance;Assistance with cooking/housework ?  ?Equipment Recommendations ? None recommended by PT  ?  ?Recommendations for Other Services   ? ? ?  ?Precautions / Restrictions Precautions ?Precautions: Fall ?Restrictions ?Weight Bearing Restrictions: No ?LLE Weight Bearing: Weight bearing as tolerated ?Other Position/Activity Restrictions: WBAT for transfers only  ?  ? ?Mobility ? Bed Mobility ?Overal bed mobility: Needs Assistance ?Bed Mobility: Supine to Sit ?  ?  ?Supine to sit: Mod assist, Max assist ?  ?  ?General bed mobility comments: Increased time with cues for sequence  and use of R LE to self assist but with limited follow through by pt ?  ? ?Transfers ?Overall transfer level: Needs assistance ?Equipment used: Rolling walker (2 wheels) ?Transfers: Sit to/from Stand, Bed to chair/wheelchair/BSC ?Sit to Stand: Min assist, Mod assist, +2 physical assistance, +2 safety/equipment, From elevated surface ?  ?Step pivot transfers: Min assist, Mod assist, +2 physical assistance, +2 safety/equipment ?  ?  ?  ?General transfer comment: cues for LE management and use of UEs to self assist.  Physical assist to bring wt up and fwd and to balance in intial standing with RW; Step pvt bed to chair with RW;  Sit<>stand x 2 for hygiene following urine incontinence ?  ? ?Ambulation/Gait ?Ambulation/Gait assistance: +2 physical assistance, +2 safety/equipment, Min assist, Mod assist ?Gait Distance (Feet): 2 Feet ?Assistive device: Rolling walker (2 wheels) ?Gait Pattern/deviations: Step-to pattern, Decreased step length - right, Decreased step length - left, Shuffle, Trunk flexed ?Gait velocity: decr ?  ?  ?General Gait Details: cues for posture, position from RW and sequence.  Pt performed step pvt with RW and chair pulled up behind ? ? ?Stairs ?  ?  ?  ?  ?  ? ? ?Wheelchair Mobility ?  ? ?Modified Rankin (Stroke Patients Only) ?  ? ? ?  ?Balance Overall balance assessment: History of Falls, Needs assistance ?Sitting-balance support: Feet supported ?Sitting balance-Leahy Scale: Fair ?  ?  ?Standing balance support: Reliant on assistive device for balance, During functional activity ?Standing balance-Leahy Scale: Poor ?  ?  ?  ?  ?  ?  ?  ?  ?  ?  ?  ?  ?  ? ?  ?Cognition Arousal/Alertness: Awake/alert ?Behavior During Therapy: Flat affect ?Overall Cognitive Status: History  of cognitive impairments - at baseline ?  ?  ?  ?  ?  ?  ?  ?  ?  ?  ?  ?  ?  ?  ?  ?  ?  ?  ?  ? ?  ?Exercises General Exercises - Lower Extremity ?Ankle Circles/Pumps: AAROM, Both, 15 reps, Supine ?Heel Slides: AAROM, Left, 15  reps, Supine ?Hip ABduction/ADduction: AAROM, Left, 15 reps, Supine ? ?  ?General Comments   ?  ?  ? ?Pertinent Vitals/Pain Pain Assessment ?Pain Assessment: Faces ?Faces Pain Scale: Hurts little more ?Pain Location: L hip ?Pain Descriptors / Indicators: Sore ?Pain Intervention(s): Monitored during session, Limited activity within patient's tolerance  ? ? ?Home Living   ?  ?  ?  ?  ?  ?  ?  ?  ?  ?   ?  ?Prior Function    ?  ?  ?   ? ?PT Goals (current goals can now be found in the care plan section) Acute Rehab PT Goals ?Patient Stated Goal: Regain IND ?PT Goal Formulation: With patient ?Time For Goal Achievement: 10/15/21 ?Potential to Achieve Goals: Fair ?Progress towards PT goals: Progressing toward goals ? ?  ?Frequency ? ? ? Min 3X/week ? ? ? ?  ?PT Plan Current plan remains appropriate  ? ? ?Co-evaluation   ?  ?  ?  ?  ? ?  ?AM-PAC PT "6 Clicks" Mobility   ?Outcome Measure ? Help needed turning from your back to your side while in a flat bed without using bedrails?: A Lot ?Help needed moving from lying on your back to sitting on the side of a flat bed without using bedrails?: A Lot ?Help needed moving to and from a bed to a chair (including a wheelchair)?: A Lot ?Help needed standing up from a chair using your arms (e.g., wheelchair or bedside chair)?: A Lot ?Help needed to walk in hospital room?: Total ?Help needed climbing 3-5 steps with a railing? : Total ?6 Click Score: 10 ? ?  ?End of Session Equipment Utilized During Treatment: Gait belt ?Activity Tolerance: Patient tolerated treatment well ?Patient left: in chair;with call bell/phone within reach;with chair alarm set;with nursing/sitter in room ?Nurse Communication: Mobility status ?PT Visit Diagnosis: Unsteadiness on feet (R26.81);Difficulty in walking, not elsewhere classified (R26.2);Pain ?Pain - Right/Left: Left ?Pain - part of body: Hip ?  ? ? ?Time: 2549-8264 ?PT Time Calculation (min) (ACUTE ONLY): 26 min ? ?Charges:  $Therapeutic Exercise: 8-22  mins ?$Therapeutic Activity: 8-22 mins          ?          ? ?Debe Coder PT ?Acute Rehabilitation Services ?Pager [phone] ?Office [phone] ? ? ? ?Caylor Tallarico ?10/03/2021, 3:36 PM ? ?

## 2021-10-04 DIAGNOSIS — R413 Other amnesia: Secondary | ICD-10-CM | POA: Diagnosis not present

## 2021-10-04 DIAGNOSIS — S72002A Fracture of unspecified part of neck of left femur, initial encounter for closed fracture: Secondary | ICD-10-CM | POA: Diagnosis not present

## 2021-10-04 DIAGNOSIS — I7121 Aneurysm of the ascending aorta, without rupture: Secondary | ICD-10-CM | POA: Diagnosis not present

## 2021-10-04 DIAGNOSIS — I5042 Chronic combined systolic (congestive) and diastolic (congestive) heart failure: Secondary | ICD-10-CM | POA: Diagnosis not present

## 2021-10-04 LAB — CBC WITH DIFFERENTIAL/PLATELET
Abs Immature Granulocytes: 0.05 10*3/uL (ref 0.00–0.07)
Basophils Absolute: 0 10*3/uL (ref 0.0–0.1)
Basophils Relative: 0 %
Eosinophils Absolute: 0.3 10*3/uL (ref 0.0–0.5)
Eosinophils Relative: 4 %
HCT: 27.5 % — ABNORMAL LOW (ref 36.0–46.0)
Hemoglobin: 8.7 g/dL — ABNORMAL LOW (ref 12.0–15.0)
Immature Granulocytes: 1 %
Lymphocytes Relative: 16 %
Lymphs Abs: 1 10*3/uL (ref 0.7–4.0)
MCH: 31.3 pg (ref 26.0–34.0)
MCHC: 31.6 g/dL (ref 30.0–36.0)
MCV: 98.9 fL (ref 80.0–100.0)
Monocytes Absolute: 0.8 10*3/uL (ref 0.1–1.0)
Monocytes Relative: 13 %
Neutro Abs: 4.3 10*3/uL (ref 1.7–7.7)
Neutrophils Relative %: 66 %
Platelets: 248 10*3/uL (ref 150–400)
RBC: 2.78 MIL/uL — ABNORMAL LOW (ref 3.87–5.11)
RDW: 14.5 % (ref 11.5–15.5)
WBC: 6.4 10*3/uL (ref 4.0–10.5)
nRBC: 0 % (ref 0.0–0.2)

## 2021-10-04 LAB — BASIC METABOLIC PANEL
Anion gap: 7 (ref 5–15)
BUN: 29 mg/dL — ABNORMAL HIGH (ref 8–23)
CO2: 29 mmol/L (ref 22–32)
Calcium: 8.6 mg/dL — ABNORMAL LOW (ref 8.9–10.3)
Chloride: 105 mmol/L (ref 98–111)
Creatinine, Ser: 0.81 mg/dL (ref 0.44–1.00)
GFR, Estimated: >60 mL/min (ref >60)
Glucose, Bld: 101 mg/dL — ABNORMAL HIGH (ref 70–99)
Potassium: 3.9 mmol/L (ref 3.5–5.1)
Sodium: 141 mmol/L (ref 135–145)

## 2021-10-04 LAB — MAGNESIUM: Magnesium: 2.1 mg/dL (ref 1.7–2.4)

## 2021-10-04 MED ORDER — LISINOPRIL 10 MG PO TABS
10.0000 mg | ORAL_TABLET | Freq: Every day | ORAL | Status: DC
  Administered 2021-10-04 – 2021-10-06 (×3): 10 mg via ORAL
  Filled 2021-10-04 (×3): qty 1

## 2021-10-04 MED ORDER — FUROSEMIDE 40 MG PO TABS
40.0000 mg | ORAL_TABLET | Freq: Every day | ORAL | Status: DC
Start: 2021-10-06 — End: 2021-10-06
  Administered 2021-10-06: 40 mg via ORAL
  Filled 2021-10-04: qty 1

## 2021-10-04 MED ORDER — ENOXAPARIN SODIUM 40 MG/0.4ML IJ SOSY
40.0000 mg | PREFILLED_SYRINGE | INTRAMUSCULAR | Status: DC
Start: 2021-10-05 — End: 2021-10-06
  Administered 2021-10-05 – 2021-10-06 (×2): 40 mg via SUBCUTANEOUS
  Filled 2021-10-04 (×2): qty 0.4

## 2021-10-04 MED ORDER — POLYETHYLENE GLYCOL 3350 17 G PO PACK
17.0000 g | PACK | Freq: Every day | ORAL | Status: DC
  Administered 2021-10-04 – 2021-10-06 (×3): 17 g via ORAL
  Filled 2021-10-04 (×3): qty 1

## 2021-10-04 MED ORDER — CEPHALEXIN 500 MG PO CAPS
500.0000 mg | ORAL_CAPSULE | Freq: Two times a day (BID) | ORAL | Status: AC
  Administered 2021-10-04: 500 mg via ORAL
  Filled 2021-10-04: qty 1

## 2021-10-04 NOTE — Plan of Care (Signed)
?  Problem: Education: ?Goal: Knowledge of General Education information will improve ?Description: Including pain rating scale, medication(s)/side effects and non-pharmacologic comfort measures ?Outcome: Progressing ?  ?Problem: Pain Managment: ?Goal: General experience of comfort will improve ?Outcome: Progressing ?  ?Problem: Safety: ?Goal: Ability to remain free from injury will improve ?Outcome: Progressing ?  ?Problem: Clinical Measurements: ?Goal: Postoperative complications will be avoided or minimized ?Outcome: Progressing ?  ?

## 2021-10-04 NOTE — Progress Notes (Signed)
Physical Therapy Treatment ?Patient Details ?Name: Elizabeth Mccullough ?MRN: [id number] ?DOB: 28-Feb-1941 ?Today's Date: 10/04/2021 ? ? ?History of Present Illness Patient is an 81 year old female found down on floor at [facility], admitted with L hip fracture. S/p IM 3/15. PMH:  memory deficit/dementia easily gets sundowning around 3 to 4 PM, Parkinson's disease, hypertension, chronic combined systolic diastolic CHF, anxiety/depression on Xanax, HLD, HTN, asymptomatic ascending aortic aneurysm stable at 4.2 cm for more than 5 years ? ?  ?PT Comments  ? ? Pt continues very agreeable and with improvement noted in ability to participate and follow through with cues but continues to require increased time and significant assist for all tasks.  This date, pt participated with limited therex program, assisted up to EOB sitting and then step pvt chair to bed. ?  ?Recommendations for follow up therapy are one component of a multi-disciplinary discharge planning process, led by the attending physician.  Recommendations may be updated based on patient status, additional functional criteria and insurance authorization. ? ?Follow Up Recommendations ? Skilled nursing-short term rehab (<3 hours/day) ?  ?  ?Assistance Recommended at Discharge Frequent or constant Supervision/Assistance  ?Patient can return home with the following A lot of help with walking and/or transfers;A lot of help with bathing/dressing/bathroom;Assist for transportation;Help with stairs or ramp for entrance;Assistance with cooking/housework ?  ?Equipment Recommendations ? None recommended by PT  ?  ?Recommendations for Other Services   ? ? ?  ?Precautions / Restrictions Precautions ?Precautions: Fall ?Restrictions ?Weight Bearing Restrictions: No ?LLE Weight Bearing: Weight bearing as tolerated ?Other Position/Activity Restrictions: WBAT for transfers only  ?  ? ?Mobility ? Bed Mobility ?Overal bed mobility: Needs Assistance ?Bed Mobility: Supine to Sit ?  ?  ?Supine  to sit: Min assist, Mod assist ?  ?  ?General bed mobility comments: patient needed increased time for sequencing and following commands. patient was able to attempt to assist physically with BUE for supine to sit. ?  ? ?Transfers ?Overall transfer level: Needs assistance ?Equipment used: Rolling walker (2 wheels) ?Transfers: Sit to/from Stand, Bed to chair/wheelchair/BSC ?Sit to Stand: Mod assist, +2 physical assistance, +2 safety/equipment, From elevated surface ?  ?Step pivot transfers: Min assist, Mod assist, +2 physical assistance, +2 safety/equipment ?  ?  ?  ?General transfer comment: cues for LE management and use of UEs to self assist.  Physical assist to bring wt up and fwd and to balance in intial standing with RW; Step pvt bed to chair with RW; ?  ? ?Ambulation/Gait ?Ambulation/Gait assistance: +2 physical assistance, +2 safety/equipment, Min assist, Mod assist ?Gait Distance (Feet): 1 Feet ?Assistive device: Rolling walker (2 wheels) ?Gait Pattern/deviations: Step-to pattern, Decreased step length - right, Decreased step length - left, Shuffle, Trunk flexed ?Gait velocity: decr ?  ?  ?General Gait Details: Step pvt chair to bed only ? ? ?Stairs ?  ?  ?  ?  ?  ? ? ?Wheelchair Mobility ?  ? ?Modified Rankin (Stroke Patients Only) ?  ? ? ?  ?Balance Overall balance assessment: History of Falls, Needs assistance ?Sitting-balance support: Feet supported ?Sitting balance-Leahy Scale: Fair ?  ?  ?Standing balance support: Reliant on assistive device for balance, During functional activity ?Standing balance-Leahy Scale: Poor ?Standing balance comment: posterior leaning ?  ?  ?  ?  ?  ?  ?  ?  ?  ?  ?  ?  ? ?  ?Cognition Arousal/Alertness: Awake/alert ?Behavior During Therapy: Flat affect ?Overall Cognitive Status:  History of cognitive impairments - at baseline ?  ?  ?  ?  ?  ?  ?  ?  ?  ?  ?  ?  ?  ?  ?  ?  ?General Comments: patient was very soft spoken with patient easily distracted during tasks. daughter was  present but also had difficulty understanding what patient was saying at times. ?  ?  ? ?  ?Exercises General Exercises - Lower Extremity ?Ankle Circles/Pumps: AAROM, Both, 15 reps, Supine ?Heel Slides: AAROM, Left, Supine, 20 reps ?Hip ABduction/ADduction: AAROM, Left, 15 reps, Supine ? ?  ?General Comments   ?  ?  ? ?Pertinent Vitals/Pain Pain Assessment ?Pain Assessment: Faces ?Faces Pain Scale: Hurts little more ?Pain Location: L hip ?Pain Descriptors / Indicators: Sore ?Pain Intervention(s): Limited activity within patient's tolerance, Monitored during session, Premedicated before session, Ice applied  ? ? ?Home Living   ?  ?  ?  ?  ?  ?  ?  ?  ?  ?   ?  ?Prior Function    ?  ?  ?   ? ?PT Goals (current goals can now be found in the care plan section) Acute Rehab PT Goals ?Patient Stated Goal: Regain IND ?PT Goal Formulation: With patient ?Time For Goal Achievement: 10/15/21 ?Potential to Achieve Goals: Fair ?Progress towards PT goals: Progressing toward goals (slowly) ? ?  ?Frequency ? ? ? Min 3X/week ? ? ? ?  ?PT Plan Current plan remains appropriate  ? ? ?Co-evaluation PT/OT/SLP Co-Evaluation/Treatment: Yes ?Reason for Co-Treatment: To address functional/ADL transfers ?PT goals addressed during session: Mobility/safety with mobility ?OT goals addressed during session: ADL's and self-care ?  ? ?  ?AM-PAC PT "6 Clicks" Mobility   ?Outcome Measure ? Help needed turning from your back to your side while in a flat bed without using bedrails?: A Lot ?Help needed moving from lying on your back to sitting on the side of a flat bed without using bedrails?: A Lot ?Help needed moving to and from a bed to a chair (including a wheelchair)?: A Lot ?Help needed standing up from a chair using your arms (e.g., wheelchair or bedside chair)?: A Lot ?Help needed to walk in hospital room?: Total ?Help needed climbing 3-5 steps with a railing? : Total ?6 Click Score: 10 ? ?  ?End of Session Equipment Utilized During Treatment:  Gait belt ?Activity Tolerance: Patient tolerated treatment well ?Patient left: in chair;with call bell/phone within reach;with chair alarm set;with nursing/sitter in room ?Nurse Communication: Mobility status ?PT Visit Diagnosis: Unsteadiness on feet (R26.81);Difficulty in walking, not elsewhere classified (R26.2);Pain ?Pain - Right/Left: Left ?Pain - part of body: Hip ?  ? ? ?Time: 4656-8127 ?PT Time Calculation (min) (ACUTE ONLY): 24 min ? ?Charges:  $Therapeutic Activity: 8-22 mins          ?          ? ?Debe Coder PT ?Acute Rehabilitation Services ?Pager [phone] ?Office [phone] ? ? ? ?Pritika Alvarez ?10/04/2021, 4:15 PM ? ?

## 2021-10-04 NOTE — Progress Notes (Signed)
?PROGRESS NOTE ? ? ? ?Elizabeth Mccullough  NOM:[id number] DOB: 1940/12/06 DOA: 09/29/2021 ?PCP: Ann Held, DO  ? ? ? ?Brief Narrative:  ?82 year old female from independent living facility with history of memory deficit/dementia easily gets sundowning around 3 to 4 PM, Parkinson's disease, hypertension, chronic combined systolic diastolic CHF, anxiety/depression on Xanax, HLD, HTN, asymptomatic ascending aortic aneurysm stable at 4.2 cm for more than 5 years brought to the ED after she was found on the floor of the intimate living facility.  Patient reports that she slipped and fell was down for "20 minutes".  She was having left hip pain [facility] found to have left intertrochanteric hip fracture and was sent to the ED for evaluation and admission. Last echo 10/21 with EF 30-35%, G1 DD, moderate to severe AR. ?In the ED vitals stable, labs showed stable CBC BMP.  Chest x-ray clear ascending thoracic aortic aneurysm again noted.  X-ray of the hip displaced and comminuted intertrochanteric fracture of the left hip, CT of the hip with severely comminuted left intertrochanteric fracture with apex superolateral angulation, intramuscular hematoma in the left gluteus medius muscle. ?ED physician informed me that  patient is scheduled for surgery tomorrow by Dr. Lyla Glassing and admission was requested for further management. ?On my evaluation at bedside patient is mildly confused, daughter notes that around 3 to 4 PM she gets confused normally and has baseline anxiety.  Patient denies any pain no leg edema. ? ? ?Subjective: ? ?POD#4,  ?No overnight event documented or reported  ?Vital signs are stable,  ?Labs improving  ? ?Appear weak, pleasantly confused, she is sitting up in chair ? ?Medically stable to discharge, Awaiting for snf placement  ?Daughter at bedside  ? ?Assessment & Plan: ? Principal Problem: ?  Closed left hip fracture ([place]) ?Active Problems: ?  Hyperlipidemia ?   Parkinson disease ([place]) ?  Chronic combined systolic and diastolic CHF (congestive heart failure) ([place]) ?  NICM (nonischemic cardiomyopathy) ([place]) ?  Aneurysm of ascending aorta ?  Memory deficit ?  Anxiety and depression ?  Essential hypertension ? ? ? ?Assessment and Plan: ? ? ? ?Closed left hip fracture/Comminuted Left peritrochanteric femur fracture ?-Patient is from a independent living facility, reports that she slipped and fell was down for "20 minutes".  ?-S/p Intramedullary fixation Left femur on 3/15 by Dr. Lyla Glassing ?-Postop pain management, wound care, weightbearing status, DVT prophylaxis per Ortho, appreciate Ortho input ?-awaiting for snf placement  ? ?Fever 100.4 on postop day 1 on 3/16, has since afebrile  ?No leukocytosis, blood culture no growth,Chest x-ray no acute findings ? UA positive leuk, negative nitrite, urine culture no growth ? Finished empirically abx treatment with rocephin then keflex, last dose on 3/19. ? ?Acute on chronic  anemia, normocytic ?-S/p prbc transfusion x1  on 3/15 prior to going to OR, then prbc x1 on 3/17 ?-Does not appear to have acute external bleed, RN reports stool is light brown, FOBT ordered, pending collection ?-Start J62 and folic acid supplement ?- hgb improved, monitor hgb  ? ?AKI on CKDII ?Treated with abx for possible UTI, ?Cr peaked at 1.28, cr down to 0.8 on 3/19 ?  resume acei on 3/19 ?Repeat bmp in am ? ?Acute on Chronic combined CHF/NICM  ?Repeat echo EF 40% to 83%, grade 1 diastolic dysfunction ?Continue beta-blocker,  ?ACE inhibitor held initially due to [facility], resumed on 3/19 ?wheezing resolved with Iv lasix , cxr no acute findings, there is no  edema on exam, ?Continue iv lasix for another  day, then transition back to home oral lasix ?Close monitor volume status ? ?Hypertension ?Continue beta-blocker,  ?ACE inhibitor held initially due to [facility], resumed on 3/19 ?Currently on iv lasix with a plan to resume oral lasix at discharge ?Monitor blood pressure  and volume status ? ?Aneurysm of ascending aorta ?she was previously seen by CT surgery appeared present for more than 5 years and felt to be stable.  Currently denies chest pain ? ?Hyperlipidemia ?Continue statin ? ?Parkinson's disease ?memory deficit/dementia  ?Anxiety/depression ?daughter reports she has mild dementia, easily gets sundowning around 3 to 4 PM,  ?She is on chronic Xanax resume, will need to use as needed medication for agitation ? ?Daughter does not think patient will have bad delirium in the hospital, will monitor, delirium prevention protocol order placed ?She is pleasantly confused today during encounter  ? ? ?: Body mass index is 26.21 kg/m?Marland Kitchen. ?  ? ? ? ?I have Reviewed nursing notes, Vitals, pain scores, I/o's, Lab results and  imaging results since pt's last encounter, details please see discussion above  ?I ordered the following labs:  ?Unresulted Labs (From admission, onward)  ? ?  Start     Ordered  ? 10/07/21 0500  Creatinine, serum  (enoxaparin (LOVENOX)  CrCl < 30 mL/min)  Weekly,   R     ?Comments: while on enoxaparin therapy. ?  ? 09/30/21 1550  ? 10/05/21 0500  CBC  Tomorrow morning,   R       ? 10/04/21 1328  ? 10/05/21 0947  Basic metabolic panel  Tomorrow morning,   R       ? 10/04/21 1328  ? 10/05/21 0500  Magnesium  Tomorrow morning,   STAT       ? 10/04/21 1328  ? Unscheduled  Occult blood card to lab, stool  As needed,   R     ? 10/01/21 1455  ? Unscheduled  Occult blood card to lab, stool RN will collect  As needed,   R     ?Question:  Specimen to be collected by:  Answer:  RN will collect  ? 10/03/21 1540  ? ?  ?  ? ?  ? ? ? ?DVT prophylaxis: enoxaparin (LOVENOX) injection 40 mg Start: 10/05/21 0800 ?SCDs Start: 09/30/21 1551 ? ? ?Code Status:   Code Status: DNR ? ?Family Communication: daughter ?Disposition:  ? ?Status is: Inpatient ? ? Dispo: The patient is from: independent living  ?             Anticipated d/c is to: snf  ?             Anticipated d/c date is: snf, family  [place] [facility] [place], medically stable to discharge, awaiting for a bed ? ?Antimicrobials:   ?Anti-infectives (From admission, onward)  ? ? Start     Dose/Rate Route Frequency Ordered Stop  ? 10/03/21 1415  cephALEXin (KEFLEX) capsule 500 mg       ? 500 mg Oral Every 12 hours 10/03/21 1316 10/06/21 0959  ? 10/01/21 1800  cefTRIAXone (ROCEPHIN) 1 g in sodium chloride 0.9 % 100 mL IVPB  Status:  Discontinued       ? 1 g ?200 mL/hr over 30 Minutes Intravenous Every 24 hours 10/01/21 1630 10/03/21 1315  ? 10/01/21 1715  cefTRIAXone (ROCEPHIN) 1 g in sodium chloride 0.9 % 100 mL IVPB  Status:  Discontinued       ?  1 g ?200 mL/hr over 30 Minutes Intravenous Every 24 hours 10/01/21 1628 10/01/21 1629  ? 09/30/21 1845  ceFAZolin (ANCEF) IVPB 2g/100 mL premix       ? 2 g ?200 mL/hr over 30 Minutes Intravenous Every 6 hours 09/30/21 1550 10/01/21 0019  ? 09/30/21 1045  ceFAZolin (ANCEF) IVPB 2g/100 mL premix       ? 2 g ?200 mL/hr over 30 Minutes Intravenous On call to O.R. 09/30/21 0722 09/30/21 1251  ? ?  ? ? ? ? ? ? ?Objective: ?Vitals:  ? 10/03/21 0634 10/03/21 1506 10/03/21 2201 10/04/21 0451  ?BP: (!) 159/64 140/74 122/69 (!) 144/65  ?Pulse: 76 79 70 70  ?Resp: '14 16 18 17  '$ ?Temp: 97.7 ?F (36.5 ?C) 98.9 ?F (37.2 ?C) 98.3 ?F (36.8 ?C) 98.2 ?F (36.8 ?C)  ?TempSrc: Oral Oral Oral Oral  ?SpO2: 94% 94% 98% 100%  ?Weight:      ?Height:      ? ? ?Intake/Output Summary (Last 24 hours) at 10/04/2021 1329 ?Last data filed at 10/04/2021 [phone] ?Gross per 24 hour  ?Intake 460 ml  ?Output 1425 ml  ?Net -965 ml  ? ?Filed Weights  ? 09/29/21 1254 09/30/21 0500  ?Weight: 54.9 kg 65 kg  ? ? ?Examination: ? ?General exam: Frail, alert, communicative, pleasantly confused  ?Respiratory system:  wheezing resolved, minimal crackles at right lung base, Respiratory effort normal. ?Cardiovascular system:  RRR.  ?Gastrointestinal system: Abdomen is nondistended, soft and nontender.  Normal bowel sounds heard. ?Central nervous system:  only oriented to person but calm and cooperative ?Extremities: Postop changes left lower extremity, no edema ?Skin: No rashes, lesions or ulcers ?Psychiatry: calm, no agitation.  ? ? ? ?Data Reviewed: I have pe

## 2021-10-04 NOTE — Plan of Care (Signed)
  Problem: Safety: Goal: Ability to remain free from injury will improve Outcome: Progressing   Problem: Pain Managment: Goal: General experience of comfort will improve Outcome: Progressing   

## 2021-10-04 NOTE — Progress Notes (Signed)
Occupational Therapy Treatment ?Patient Details ?Name: Elizabeth Mccullough ?MRN: [id number] ?DOB: [birth year] ?Today's Date: 10/04/2021 ? ? ?History of present illness Patient is an 81 year old female found down on floor at IL[facility], admitted with L hip fracture. S/p IM 3/15. PMH:  memory deficit/dementia easily gets sundowning around 3 to 4 PM, Parkinson's disease, hypertension, chronic combined systolic diastolic CHF, anxiety/depression on Xanax, HLD, HTN, asymptomatic ascending aortic aneurysm stable at 4.2 cm for more than 5 years ?  ?OT comments ? Patient was noted to be soft spoken during session and easily distracted. Patients daughter was present during session as well. Patient remains +2 for transfers with posterior leaning noted in standing with physical assist to weight shift and transfer to recliner in room. Patient was able to complete grooming tasks seated in recliner with min cues for sequencing of task. Patient would continue to benefit from skilled OT services at this time while admitted and after d/c to address noted deficits in order to improve overall safety and independence in ADLs.  ?  ? ?Recommendations for follow up therapy are one component of a multi-disciplinary discharge planning process, led by the attending physician.  Recommendations may be updated based on patient status, additional functional criteria and insurance authorization. ?   ?Follow Up Recommendations ? Skilled nursing-short term rehab (<3 hours/day)  ?  ?Assistance Recommended at Discharge Frequent or constant Supervision/Assistance  ?Patient can return home with the following ? A lot of help with walking and/or transfers;A lot of help with bathing/dressing/bathroom;Direct supervision/assist for medications management;Direct supervision/assist for financial management;Assist for transportation;Help with stairs or ramp for entrance;Assistance with cooking/housework ?  ?Equipment Recommendations ? None recommended by OT  ?   ?Recommendations for Other Services   ? ?  ?Precautions / Restrictions Precautions ?Precautions: Fall ?Restrictions ?Weight Bearing Restrictions: No ?LLE Weight Bearing: Weight bearing as tolerated ?Other Position/Activity Restrictions: WBAT for transfers only  ? ? ?  ? ?Mobility Bed Mobility ?Overal bed mobility: Needs Assistance ?Bed Mobility: Sit to Supine ?  ?  ?Supine to sit: Min assist, Mod assist ?  ?  ?General bed mobility comments: patient needed increased time for sequencing and following commands. patient was able to attempt to assist physically with BUE for supine to sit. ?  ? ?Transfers ?  ?  ?  ?  ?  ?  ?  ?  ?  ?  ?  ?  ?Balance Overall balance assessment: History of Falls, Needs assistance ?Sitting-balance support: Feet supported ?Sitting balance-Leahy Scale: Fair ?  ?  ?Standing balance support: Reliant on assistive device for balance, During functional activity ?Standing balance-Leahy Scale: Poor ?Standing balance comment: posterior leaning ?  ?  ?  ?  ?  ?  ?  ?  ?  ?  ?  ?   ? ?ADL either performed or assessed with clinical judgement  ? ?ADL Overall ADL's : Needs assistance/impaired ?  ?  ?Grooming: Set up;Sitting;Oral care ?Grooming Details (indicate cue type and reason): with increased time seated in recliner with patient easily distracted during tasks with cues for sequencing when patient attempted to brush teeth with no toothpaste in place. ?  ?  ?  ?  ?  ?  ?  ?  ?  ?Toilet Transfer Details (indicate cue type and reason): patient was min x1 and mod A x2 for transfer from edge of bed to recliner in room with RW with cues for intiation, sequencing and safety with task. patient was noted to  have strong posterior leaning upon inital standing with physical assist to maintain balance. ?  ?  ?  ?  ?  ?  ?  ? ?Extremity/Trunk Assessment   ?  ?  ?  ?  ?  ? ?Vision   ?  ?  ?Perception   ?  ?Praxis   ?  ? ?Cognition Arousal/Alertness: Awake/alert ?Behavior During Therapy: Flat affect ?Overall  Cognitive Status: History of cognitive impairments - at baseline ?  ?  ?  ?  ?  ?  ?  ?  ?  ?  ?  ?  ?  ?  ?  ?  ?General Comments: patient was very soft spoken with patient easily distracted during tasks. daughter was present but also had difficulty understanding what patient was saying at times. ?  ?  ?   ?Exercises   ? ?  ?Shoulder Instructions   ? ? ?  ?General Comments    ? ? ?Pertinent Vitals/ Pain       Pain Assessment ?Pain Assessment: Faces ?Faces Pain Scale: Hurts little more ?Pain Location: L hip ?Pain Descriptors / Indicators: Sore ?Pain Intervention(s): Limited activity within patient's tolerance, Monitored during session, Premedicated before session ? ?Home Living   ?  ?  ?  ?  ?  ?  ?  ?  ?  ?  ?  ?  ?  ?  ?  ?  ?  ?  ? ?  ?Prior Functioning/Environment    ?  ?  ?  ?   ? ?Frequency ? Min 2X/week  ? ? ? ? ?  ?Progress Toward Goals ? ?OT Goals(current goals can now be found in the care plan section) ? Progress towards OT goals: Progressing toward goals ? ?   ?Plan Discharge plan remains appropriate   ? ?Co-evaluation ? ? ?   ?Reason for Co-Treatment: To address functional/ADL transfers ?PT goals addressed during session: Mobility/safety with mobility ?OT goals addressed during session: ADL's and self-care ?  ? ?  ?AM-PAC OT "6 Clicks" Daily Activity     ?Outcome Measure ? ? Help from another person eating meals?: None ?Help from another person taking care of personal grooming?: A Little ?Help from another person toileting, which includes using toliet, bedpan, or urinal?: A Lot ?Help from another person bathing (including washing, rinsing, drying)?: A Lot ?Help from another person to put on and taking off regular upper body clothing?: A Little ?Help from another person to put on and taking off regular lower body clothing?: Total ?6 Click Score: 15 ? ?  ?End of Session Equipment Utilized During Treatment: Rolling walker (2 wheels);Gait belt ? ?OT Visit Diagnosis: Unsteadiness on feet (R26.81);Other  abnormalities of gait and mobility (R26.89);History of falling (Z91.81) ?  ?Activity Tolerance Patient tolerated treatment well ?  ?Patient Left in chair;with call bell/phone within reach;with chair alarm set;with family/visitor present ?  ?Nurse Communication Mobility status ?  ? ?   ? ?Time: 1130-1150 ?OT Time Calculation (min): 20 min ? ?Charges: OT General Charges ?$OT Visit: 1 Visit ?OT Treatments ?$Self Care/Home Management : 8-22 mins ? ?Niaja Stickley OTR/L, MS ?Acute Rehabilitation Department ?Office# [phone] ?Pager# [phone] ? ? ?[place] ?10/04/2021, 12:51 PM ?

## 2021-10-05 DIAGNOSIS — S72002D Fracture of unspecified part of neck of left femur, subsequent encounter for closed fracture with routine healing: Secondary | ICD-10-CM

## 2021-10-05 LAB — BASIC METABOLIC PANEL
Anion gap: 7 (ref 5–15)
BUN: 33 mg/dL — ABNORMAL HIGH (ref 8–23)
CO2: 29 mmol/L (ref 22–32)
Calcium: 8.7 mg/dL — ABNORMAL LOW (ref 8.9–10.3)
Chloride: 103 mmol/L (ref 98–111)
Creatinine, Ser: 0.83 mg/dL (ref 0.44–1.00)
GFR, Estimated: >60 mL/min (ref >60)
Glucose, Bld: 99 mg/dL (ref 70–99)
Potassium: 3.8 mmol/L (ref 3.5–5.1)
Sodium: 139 mmol/L (ref 135–145)

## 2021-10-05 LAB — CBC
HCT: 27.8 % — ABNORMAL LOW (ref 36.0–46.0)
Hemoglobin: 8.9 g/dL — ABNORMAL LOW (ref 12.0–15.0)
MCH: 31.6 pg (ref 26.0–34.0)
MCHC: 32 g/dL (ref 30.0–36.0)
MCV: 98.6 fL (ref 80.0–100.0)
Platelets: 275 10*3/uL (ref 150–400)
RBC: 2.82 MIL/uL — ABNORMAL LOW (ref 3.87–5.11)
RDW: 14 % (ref 11.5–15.5)
WBC: 5.9 10*3/uL (ref 4.0–10.5)
nRBC: 0 % (ref 0.0–0.2)

## 2021-10-05 LAB — MAGNESIUM: Magnesium: 2.1 mg/dL (ref 1.7–2.4)

## 2021-10-05 MED ORDER — ACETAMINOPHEN 325 MG PO TABS: 650.0000 mg | ORAL_TABLET | Freq: Four times a day (QID) | ORAL | Status: DC | PRN

## 2021-10-05 NOTE — Care Management Important Message (Signed)
Important Message ? ?Patient Details IM Letter placed in Patients room. ?Name: Elizabeth Mccullough ?MRN: [id number] ?Date of Birth: 10-24-1940 ? ? ?Medicare Important Message Given:  Yes ? ? ? ? ?Kerin Salen ?10/05/2021, 2:48 PM ?

## 2021-10-05 NOTE — Progress Notes (Signed)
?PROGRESS NOTE ? ? ? ?Elizabeth Mccullough  HQP:[id number] DOB: [birth year] DOA: 09/29/2021 ?PCP: Ann Held, DO  ? ? ? ?Brief Narrative:  ?81 year old female from independent living facility with history of memory deficit/dementia easily gets sundowning around 3 to 4 PM, Parkinson's disease, hypertension, chronic combined systolic diastolic CHF, anxiety/depression on Xanax, HLD, HTN, asymptomatic ascending aortic aneurysm stable at 4.2 cm for more than 5 years brought to the ED after she was found on the floor of the independent living facility.  Patient reports that she slipped and fell was down for "20 minutes".  She was having left hip pain and [facility] where she was found to have left intertrochanteric hip fracture and was sent to the ED for evaluation and admission. Last echo 10/21 with EF 30-35%, G1 DD, moderate to severe AR. ? ?In the ED vitals stable, labs showed stable CBC BMP.  Chest x-ray clear ascending thoracic aortic aneurysm again noted.  X-ray of the hip displaced and comminuted intertrochanteric fracture of the left hip, CT of the hip with severely comminuted left intertrochanteric fracture with apex superolateral angulation, intramuscular hematoma in the left gluteus medius muscle. ? ?She was admitted for left hip fracture, s/p IM fixation on 3/15 by Dr. Lyla Glassing.  Medically optimized for discharge to SNF, has a bed available, awaiting Level II PASRR. ? ? ?Subjective: ? ?Not much history available from patient.  Does report some pain in the operated left hip.  No other complaints reported.  As per RN, pleasantly confused and oriented x1.  Appeared to have completed about half of her breakfast. ? ?Assessment & Plan: ? Principal Problem: ?  Closed left hip fracture ([place]) ?Active Problems: ?  Hyperlipidemia ?  Parkinson disease ([place]) ?  Chronic combined systolic and diastolic CHF (congestive heart failure) ([place]) ?  NICM (nonischemic cardiomyopathy) ([place]) ?   Aneurysm of ascending aorta ?  Memory deficit ?  Anxiety and depression ?  Essential hypertension ? ? ? ?Assessment and Plan: ? ?Closed left hip fracture/Comminuted Left peritrochanteric femur fracture ?-Patient is from a independent living facility, reports that she slipped and fell was down for "20 minutes".  ?-S/p Intramedullary fixation Left femur on 3/15 by Dr. Lyla Glassing ?-Postop pain management, wound care, weightbearing status, DVT prophylaxis per Ortho, appreciate Ortho input ?-Medically optimized for DC to SNF.  Now has a bed available and is awaiting level 2 PASSR. ? ?Fever 100.4 on postop day 1 on 3/16, has since been afebrile  ?No leukocytosis, blood culture no growth to date,Chest x-ray no acute findings ?Urine culture negative. ? Finished empirically abx treatment with rocephin then keflex, last dose on 3/19. ? ?Acute on chronic  anemia, normocytic ?-S/p prbc transfusion x1  on 3/15 prior to going to OR, then prbc x1 on 3/17 ?-Does not appear to have acute external bleed, RN reports stool is light brown, FOBT ordered, pending collection ?B12: 346.  Do not see a clear indication for folate or B12, discontinued. ?Presented with hemoglobin of 10 on 3/14 which dropped to a low of 7.2 and has been stable in the mid to high 8 g range for the last 2 days. ?Follow CBC closely as outpatient. ? ?AKI on CKDII ?Treated with abx for possible UTI, ?Cr peaked at 1.28, cr down to 0.8 on 3/20 ?resumed acei on 3/19 and creatinine has remained stable today. ?Follow BMP periodically as outpatient. ? ?Acute on Chronic combined CHF/NICM  ?Repeat echo EF 40% to 45%, grade  1 diastolic dysfunction ?Continue beta-blocker,  ?ACE inhibitor held initially due to [facility], resumed on 3/19 ?wheezing resolved with Iv lasix , cxr no acute findings, there is no edema on exam, ?Continue prior home dose of oral Lasix. ?Clinically euvolemic. ? ?Hypertension ?Continue beta-blocker,  ?ACE inhibitor held initially due to [facility], resumed on  3/19 ?Reasonably controlled. ? ?Aneurysm of ascending aorta ?she was previously seen by CT surgery appeared present for more than 5 years and felt to be stable.  Currently denies chest pain ?Outpatient follow-up as deemed necessary. ? ?Hyperlipidemia ?Continue statin ? ?Parkinson's disease ?memory deficit/dementia  ?Anxiety/depression ?daughter reports she has mild dementia, easily gets sundowning around 3 to 4 PM,  ?She is on chronic Xanax resumed, will need to use as needed medication for agitation ?Daughter does not think patient will have bad delirium in the hospital, will monitor, delirium prevention protocol order placed ?She is pleasantly confused today during encounter.  No agitation. ? ?Body mass index is 26.21 kg/m?Marland Kitchen. ?  ? ?DVT prophylaxis: enoxaparin (LOVENOX) injection 40 mg Start: 10/05/21 0800 ?SCDs Start: 09/30/21 1551 ?  Code Status: DNR ?Family Communication: None at bedside. ?Disposition:  ? ?Status is: Inpatient ? ? Dispo: The patient is from: independent living  ?             Anticipated d/c is to: snf  ?             Anticipated d/c date is: snf, [place] [facility] [place], medically stable to discharge, has an SNF bed, awaiting level 2 PASSR ? ?Antimicrobials:   ?Anti-infectives (From admission, onward)  ? ? Start     Dose/Rate Route Frequency Ordered Stop  ? 10/04/21 2200  cephALEXin (KEFLEX) capsule 500 mg       ? 500 mg Oral Every 12 hours 10/04/21 1331 10/04/21 2147  ? 10/03/21 1415  cephALEXin (KEFLEX) capsule 500 mg  Status:  Discontinued       ? 500 mg Oral Every 12 hours 10/03/21 1316 10/04/21 1331  ? 10/01/21 1800  cefTRIAXone (ROCEPHIN) 1 g in sodium chloride 0.9 % 100 mL IVPB  Status:  Discontinued       ? 1 g ?200 mL/hr over 30 Minutes Intravenous Every 24 hours 10/01/21 1630 10/03/21 1315  ? 10/01/21 1715  cefTRIAXone (ROCEPHIN) 1 g in sodium chloride 0.9 % 100 mL IVPB  Status:  Discontinued       ? 1 g ?200 mL/hr over 30 Minutes Intravenous Every 24 hours 10/01/21  1628 10/01/21 1629  ? 09/30/21 1845  ceFAZolin (ANCEF) IVPB 2g/100 mL premix       ? 2 g ?200 mL/hr over 30 Minutes Intravenous Every 6 hours 09/30/21 1550 10/01/21 0019  ? 09/30/21 1045  ceFAZolin (ANCEF) IVPB 2g/100 mL premix       ? 2 g ?200 mL/hr over 30 Minutes Intravenous On call to O.R. 09/30/21 0722 09/30/21 1251  ? ?  ? ? ? ? ? ? ?Objective: ?Vitals:  ? 10/04/21 2200 10/05/21 0546 10/05/21 0829 10/05/21 1456  ?BP:  (!) 143/58 (!) 141/65 (!) 138/57  ?Pulse:  70 74 79  ?Resp:  '16 16 18  '$ ?Temp:  98.8 ?F (37.1 ?C) 98.3 ?F (36.8 ?C) 98.2 ?F (36.8 ?C)  ?TempSrc:   Oral Oral  ?SpO2: 97% 94% 99% 97%  ?Weight:      ?Height:      ? ? ?Intake/Output Summary (Last 24 hours) at 10/05/2021 1710 ?Last data filed at 10/05/2021 0957 ?  Gross per 24 hour  ?Intake 660 ml  ?Output 400 ml  ?Net 260 ml  ? ?Filed Weights  ? 09/29/21 1254 09/30/21 0500  ?Weight: 54.9 kg 65 kg  ? ? ?Examination: ? ?General exam: Elderly female, moderately built and nourished sitting up comfortably in bed with have finished breakfast tray in front of her.  Does not appear in any distress. ?Respiratory system: Slightly diminished breath sounds in the bases but otherwise clear to auscultation.  No increased work of breathing. ?Cardiovascular system: S1 and S2 heard, RRR.  No JVD, murmurs or pedal edema. ?Gastrointestinal system: Abdomen is nondistended, soft and nontender.  Normal bowel sounds heard. ?Central nervous system: Alert and oriented only to person.  Follows simple instructions.  No focal deficits noted. ?Extremities: Left hip surgical site dressing clean and dry. ?Skin: No rashes, lesions or ulcers ?Psychiatry: calm, no agitation.  Flat affect. ? ? ? ?Data Reviewed: I have personally reviewed  labs and visualized  imaging studies since the last encounter and formulate the plan  ? ?Lab works from 3/20: BMP unremarkable.  CBC significant for hemoglobin of 8.9 up from 8.7 on 3/19.  CBGs are well controlled. ? ? ?Scheduled Meds: ? sodium chloride    Intravenous Once  ? bisoprolol  2.5 mg Oral Daily  ? Carbidopa-Levodopa ER  2 tablet Oral TID  ? docusate sodium  100 mg Oral BID  ? enoxaparin (LOVENOX) injection  40 mg Subcutaneous B86L  ? folic acid  1 mg Oral Dail

## 2021-10-05 NOTE — TOC Progression Note (Signed)
Transition of Care (TOC) - Progression Note  ? ?Patient Details  ?Name: VELEKA DJORDJEVIC ?MRN: [id number] ?Date of Birth: 05/03/1941 ? ?Transition of Care (TOC) CM/SW Contact  ?Sherie Don, LCSW ?Phone Number: ?10/05/2021, 1:36 PM ? ?Clinical Narrative: Level II PASRR is still pending. CSW provided daughter, [name], with SNF bed offers. Daughter selected [facility]. CSW confirmed bed availability with [facility] and confirmed the bed will be available tomorrow and will be held until the PASRR number is approved. TOC awaiting PASRR number.   ? ?Expected Discharge Plan: [place] ?Barriers to Discharge: Ship broker, Programmer, applications (PASRR) ? ?Expected Discharge Plan and Services ?Expected Discharge Plan: [place] ?In-house Referral: Clinical Social Work ?Post Acute Care Choice: [place] ?Living arrangements for the past 2 months: [facility])           ?DME Arranged: N/A ?DME Agency: NA ? ?Readmission Risk Interventions ?Readmission Risk Prevention Plan 10/01/2021  ?Post Dischage Appt Complete  ?Medication Screening Complete  ?Transportation Screening Complete  ?Some recent data might be hidden  ? ?

## 2021-10-05 NOTE — Plan of Care (Signed)

## 2021-10-06 DIAGNOSIS — R262 Difficulty in walking, not elsewhere classified: Secondary | ICD-10-CM | POA: Diagnosis not present

## 2021-10-06 DIAGNOSIS — R269 Unspecified abnormalities of gait and mobility: Secondary | ICD-10-CM | POA: Diagnosis not present

## 2021-10-06 DIAGNOSIS — S72302B Unspecified fracture of shaft of left femur, initial encounter for open fracture type I or II: Secondary | ICD-10-CM | POA: Diagnosis not present

## 2021-10-06 DIAGNOSIS — E785 Hyperlipidemia, unspecified: Secondary | ICD-10-CM

## 2021-10-06 DIAGNOSIS — G20 Parkinson's disease: Secondary | ICD-10-CM | POA: Diagnosis not present

## 2021-10-06 DIAGNOSIS — F411 Generalized anxiety disorder: Secondary | ICD-10-CM | POA: Diagnosis not present

## 2021-10-06 DIAGNOSIS — I10 Essential (primary) hypertension: Secondary | ICD-10-CM | POA: Diagnosis not present

## 2021-10-06 DIAGNOSIS — F330 Major depressive disorder, recurrent, mild: Secondary | ICD-10-CM | POA: Diagnosis not present

## 2021-10-06 DIAGNOSIS — D649 Anemia, unspecified: Secondary | ICD-10-CM | POA: Diagnosis not present

## 2021-10-06 DIAGNOSIS — R1310 Dysphagia, unspecified: Secondary | ICD-10-CM | POA: Diagnosis not present

## 2021-10-06 DIAGNOSIS — F0390 Unspecified dementia without behavioral disturbance: Secondary | ICD-10-CM | POA: Diagnosis not present

## 2021-10-06 DIAGNOSIS — S72002D Fracture of unspecified part of neck of left femur, subsequent encounter for closed fracture with routine healing: Secondary | ICD-10-CM | POA: Diagnosis not present

## 2021-10-06 DIAGNOSIS — M6259 Muscle wasting and atrophy, not elsewhere classified, multiple sites: Secondary | ICD-10-CM | POA: Diagnosis not present

## 2021-10-06 DIAGNOSIS — K219 Gastro-esophageal reflux disease without esophagitis: Secondary | ICD-10-CM | POA: Diagnosis not present

## 2021-10-06 DIAGNOSIS — R2681 Unsteadiness on feet: Secondary | ICD-10-CM | POA: Diagnosis not present

## 2021-10-06 DIAGNOSIS — R41841 Cognitive communication deficit: Secondary | ICD-10-CM | POA: Diagnosis not present

## 2021-10-06 DIAGNOSIS — M6281 Muscle weakness (generalized): Secondary | ICD-10-CM | POA: Diagnosis not present

## 2021-10-06 DIAGNOSIS — F03918 Unspecified dementia, unspecified severity, with other behavioral disturbance: Secondary | ICD-10-CM | POA: Diagnosis not present

## 2021-10-06 LAB — CULTURE, BLOOD (ROUTINE X 2)
Culture: NO GROWTH
Culture: NO GROWTH
Special Requests: ADEQUATE
Special Requests: ADEQUATE

## 2021-10-06 LAB — CBC
HCT: 28.5 % — ABNORMAL LOW (ref 36.0–46.0)
Hemoglobin: 9.2 g/dL — ABNORMAL LOW (ref 12.0–15.0)
MCH: 32.1 pg (ref 26.0–34.0)
MCHC: 32.3 g/dL (ref 30.0–36.0)
MCV: 99.3 fL (ref 80.0–100.0)
Platelets: 331 10*3/uL (ref 150–400)
RBC: 2.87 MIL/uL — ABNORMAL LOW (ref 3.87–5.11)
RDW: 13.9 % (ref 11.5–15.5)
WBC: 6.9 10*3/uL (ref 4.0–10.5)
nRBC: 0 % (ref 0.0–0.2)

## 2021-10-06 LAB — BASIC METABOLIC PANEL
Anion gap: 7 (ref 5–15)
BUN: 40 mg/dL — ABNORMAL HIGH (ref 8–23)
CO2: 32 mmol/L (ref 22–32)
Calcium: 8.8 mg/dL — ABNORMAL LOW (ref 8.9–10.3)
Chloride: 99 mmol/L (ref 98–111)
Creatinine, Ser: 0.93 mg/dL (ref 0.44–1.00)
GFR, Estimated: >60 mL/min (ref >60)
Glucose, Bld: 102 mg/dL — ABNORMAL HIGH (ref 70–99)
Potassium: 3.8 mmol/L (ref 3.5–5.1)
Sodium: 138 mmol/L (ref 135–145)

## 2021-10-06 MED ORDER — ALPRAZOLAM 0.25 MG PO TABS: 0.2500 mg | ORAL_TABLET | Freq: Three times a day (TID) | ORAL | 0 refills | Status: DC | PRN

## 2021-10-06 MED ORDER — ENOXAPARIN SODIUM 40 MG/0.4ML IJ SOSY: 40.0000 mg | PREFILLED_SYRINGE | INTRAMUSCULAR | Status: DC

## 2021-10-06 MED ORDER — SENNA 8.6 MG PO TABS: 1.0000 | ORAL_TABLET | Freq: Two times a day (BID) | ORAL | Status: DC

## 2021-10-06 MED ORDER — DOCUSATE SODIUM 100 MG PO CAPS: 100.0000 mg | ORAL_CAPSULE | Freq: Two times a day (BID) | ORAL | Status: DC

## 2021-10-06 MED ORDER — POLYETHYLENE GLYCOL 3350 17 G PO PACK: 17.0000 g | PACK | Freq: Every day | ORAL | Status: DC | PRN

## 2021-10-06 NOTE — TOC Transition Note (Signed)
Transition of Care (TOC) - CM/SW Discharge Note ? ?Patient Details  ?Name: Elizabeth Mccullough ?MRN: [id number] ?Date of Birth: Jul 03, 1941 ? ?Transition of Care (TOC) CM/SW Contact:  ?Sherie Don, LCSW ?Phone Number: ?10/06/2021, 12:41 PM ? ?Clinical Narrative: Patient received PASRR: [id number] H. CSW attempted to complete insurance authorization on [employer] portal, but was informed by Bernadene Bell that patient's specific insurance plan is out-of-network with [facility]. Admissions at [facility] confirmed the patient's plan is out-of-network. ? ?CSW was notified by [name] in admissions at [facility] that patient could not be admitted as she is unvaccinated for [place] and would be in a semi-private room. CSW explained this to daughter, [name], and daughter prefers [employer] over [employer]. CSW finished insurance authorization for SNF. ? ?Discharge summary, discharge orders, and SNF transfer report faxed to facility in hub. Patient will go to room 705P and the number for report is [phone]. Medical necessity form done; PTAR scheduled. Discharge packet completed. RN updated. TOC signing off. ? ?Final next level of care: [place] ?Barriers to Discharge: Barriers Resolved ? ?Patient Goals and CMS Choice ?Patient states their goals for this hospitalization and ongoing recovery are:: to return to ILF following SNF rehab ?CMS Medicare.gov Compare Post Acute Care list provided to:: Patient Represenative (must comment) ?Choice offered to / list presented to : Adult Children ? ?Discharge Placement ?PASRR number recieved: 10/06/21        ?Patient chooses bed at: [facility] ?Patient to be transferred to facility by: PTAR ?Name of family member notified: [name] (daughter) ?Patient and family notified of of transfer: 10/06/21 ? ?Discharge Plan and Services ?In-house Referral: Clinical Social Work ?Post Acute Care Choice: [place]          ?DME Arranged: N/A ?DME Agency:  NA ? ?Readmission Risk Interventions ?Readmission Risk Prevention Plan 10/01/2021  ?Post Dischage Appt Complete  ?Medication Screening Complete  ?Transportation Screening Complete  ?Some recent data might be hidden  ? ?

## 2021-10-06 NOTE — Progress Notes (Signed)
Called report to nurse Windell Norfolk at [facility]. ?

## 2021-10-06 NOTE — Plan of Care (Signed)
Plan of care reviewed. 

## 2021-10-06 NOTE — Discharge Summary (Signed)
Physician Discharge Summary  ?Elizabeth Mccullough DXI:[id number] DOB: 1940-11-11 ? ?PCP: Elizabeth Held, DO ? ?Admitted from: Independent living facility via [facility] office visit ?Discharged to: SNF ? ?Admit date: 09/29/2021 ?Discharge date: 10/06/2021 ? ?Recommendations for Outpatient Follow-up:  ? ? Contact information for follow-up providers   ? ? Mccullough, Elizabeth Edelman, MD. Schedule an appointment as soon as possible for a visit in 2 week(s).   ?Specialty: Orthopedic Surgery ?Why: For wound re-check, For suture removal ?Contact information: ?[address] ?[address] ?[place] [address] ?[phone] ? ? ?  ?  ? ? MD at SNF Follow up.   ?Why: To be seen in 3 to 4 days with repeat labs (CBC & BMP). ? ?  ?  ? ? Elizabeth Held, DO. Call.   ?Specialty: Family Medicine ?Why: To be seen after discharge from SNF. ?Contact information: ?[address] ?[address] ?[place] [address] ?[phone] ? ? ?  ?  ? ? Elizabeth Mccullough, Elizabeth Cheng, MD .   ?Specialty: Cardiology ?Contact information: ?[address] ?[address] ?[place] [address] ?[phone] ? ? ?  ?  ? ?  ?  ? ? Contact information for after-discharge care   ? ? Destination   ? ? [facility] .   ?Service: Skilled Nursing ?Contact information: ?[address] ?[place] ?[phone] ? ?  ?  ? ?  ?  ? ?  ?  ? ?  ? ? ? ?Home Health: None ?  ? ?Equipment/Devices: TBD at SNF ?  ? ?Discharge Condition: Improved and stable. ?  Code Status: DNR ?Diet recommendation:  ?Discharge Diet Orders (From admission, onward)  ? ?  Start     Ordered  ? 10/06/21 0000  Diet - low sodium heart healthy       ? 10/06/21 1230  ? ?  ?  ? ?  ?  ? ?Discharge Diagnoses:  ?Principal Problem: ?  Closed left hip fracture ([place]) ?Active Problems: ?  Hyperlipidemia ?  Parkinson disease ([place]) ?  Chronic combined systolic and diastolic CHF (congestive heart failure) ([place]) ?  NICM (nonischemic cardiomyopathy) ([place]) ?  Aneurysm of  ascending aorta ?  Memory deficit ?  Anxiety and depression ?  Essential hypertension ? ? ?Brief Summary: ?81 year old female from independent living facility with history of memory deficit/dementia easily gets sundowning around 3 to 4 PM, Parkinson's disease, hypertension, chronic combined systolic diastolic CHF, anxiety/depression on Xanax, HLD, HTN, asymptomatic ascending aortic aneurysm stable at 4.2 cm for more than 5 years brought to the ED after she was found on the floor of the independent living facility.  Patient reports that she slipped and fell was down for "20 minutes".  She was having left hip pain and [facility] where she was found to have left intertrochanteric hip fracture and was sent to the ED for evaluation and admission. Last echo 10/21 with EF 30-35%, G1 DD, moderate to severe AR. ?  ?In the ED vitals stable, labs showed stable CBC BMP.  Chest x-ray clear ascending thoracic aortic aneurysm again noted.  X-ray of the hip displaced and comminuted intertrochanteric fracture of the left hip, CT of the hip with severely comminuted left intertrochanteric fracture with apex superolateral angulation, intramuscular hematoma in the left gluteus medius muscle. ?  ?She was admitted for left hip fracture, s/p IM fixation on 3/15 by Dr. Lyla Mccullough.  Medically optimized for discharge to  SNF. ?  ?Assessment and Plan: ?  ?Closed left hip fracture/Comminuted Left peritrochanteric femur fracture ?-Patient is from a independent living facility, reports that she slipped and fell was down for "20 minutes".  ?-S/p Intramedullary fixation Left femur on 3/15 by Dr. Lyla Mccullough ?-Postop pain management, wound care, weightbearing status, DVT prophylaxis per Ortho, appreciate Ortho input ?-As per orthopedic follow-up, weightbearing as tolerated, Lovenox for postop DVT prophylaxis x30 days total, current dressing to remain in place until outpatient follow-up with orthopedics for wound check and suture  removal unless the dressing is soiled in which case orthopedics can be contacted. ?  ?Fever 100.4 on postop day 1 on 3/16, has since been afebrile  ?No leukocytosis, blood culture no growth to date,Chest x-ray no acute findings ?Urine culture negative. ? Finished empirically abx treatment with rocephin then keflex, last dose on 3/19. ?  ?Acute on chronic  anemia, normocytic ?-S/p prbc transfusion x1  on 3/15 prior to going to OR, then prbc x1 on 3/17 ?-Does not appear to have acute external bleed, RN reports stool is light brown, FOBT ordered, pending collection ?B12: 346.  Do not see a clear indication for folate or B12, discontinued. ?Presented with hemoglobin of 10 on 3/14 which dropped to a low of 7.2 and now improved, up to 9.2 on 3/21. ?Follow CBC closely as outpatient. ?  ?AKI on CKDII ?Treated with abx for possible UTI, ?Cr peaked at 1.28, cr down to 0.8-0.9 for the last 3 days. ?resumed acei on 3/19 and creatinine has remained stable ?Follow BMP periodically as outpatient. ?  ?Acute on Chronic combined CHF/NICM  ?Repeat echo EF 40% to 31%, grade 1 diastolic dysfunction ?Continue beta-blocker,  ?ACE inhibitor Mccullough initially due to [facility], resumed on 3/19 ?wheezing resolved with Iv lasix , cxr no acute findings, there is no edema on exam. ?Continue prior home dose of oral Lasix. ?Clinically euvolemic. ?Outpatient follow-up with PCP/cardiology. ?  ?Hypertension ?Continue beta-blocker,  ?ACE inhibitor Mccullough initially due to [facility], resumed on 3/19 ?Reasonably controlled. ?  ?Aneurysm of ascending aorta ?she was previously seen by CT surgery appeared present for more than 5 years and felt to be stable.  Currently denies chest pain ?Outpatient follow-up as deemed necessary. ?Also appears that patient voluntarily discontinued taking aspirin 81 Mg daily PTA.  Defer to outpatient follow-up regarding resuming same. ?  ?Hyperlipidemia ?Continue statin ?  ?Parkinson's disease ?memory deficit/dementia   ?Anxiety/depression ?daughter reports she has mild dementia, easily gets sundowning around 3 to 4 PM,  ?She is on chronic Xanax resumed, will need to use as needed medication for agitation ?Daughter does not think patient will have bad delirium in the hospital, will monitor, delirium prevention protocol order placed ?She is pleasantly confused today during encounter.  No agitation. ? ?Microscopic hematuria ?Urine microscopy on 3/16 showed >50 RBCs/hpf.  Unclear etiology.  No UTI symptoms.  Urine culture negative. ?Recommend repeating urine microscopy in a couple of weeks and if this persists then may need further evaluation. ?  ?Body mass index is 26.21 kg/m?. ? ?Consultations: ?Orthopedics ? ?Procedures: ?Intramedullary fixation of comminuted left peritrochanteric femur fracture by Dr. Lyla Mccullough on 09/30/2021 ? ? ?Discharge Instructions ? ?Discharge Instructions   ? ? (HEART FAILURE PATIENTS) Call MD:  Anytime you have any of the following symptoms: 1) 3 pound weight gain in 24 hours or 5 pounds in 1 week 2) shortness of breath, with or without a dry hacking cough 3) swelling in the hands, feet or stomach 4) if  you have to sleep on extra pillows at night in order to breathe.   Complete by: As directed ?  ? Call MD for:  difficulty breathing, headache or visual disturbances   Complete by: As directed ?  ? Call MD for:  extreme fatigue   Complete by: As directed ?  ? Call MD for:  persistant dizziness or light-headedness   Complete by: As directed ?  ? Call MD for:  persistant nausea and vomiting   Complete by: As directed ?  ? Call MD for:  redness, tenderness, or signs of infection (pain, swelling, redness, odor or green/yellow discharge around incision site)   Complete by: As directed ?  ? Call MD for:  severe uncontrolled pain   Complete by: As directed ?  ? Call MD for:  temperature >100.4   Complete by: As directed ?  ? Diet - low sodium heart healthy   Complete by: As directed ?  ? Discharge instructions    Complete by: As directed ?  ? Acetaminophen dose from all sources not to exceed 4 g/day.  ? Increase activity slowly   Complete by: As directed ?  ? No wound care   Complete by: As directed ?  ? ?  ? ?  ?Medication List  ?  ? ?STOP t

## 2021-10-06 NOTE — Plan of Care (Signed)
  Problem: Pain Managment: Goal: General experience of comfort will improve Outcome: Progressing   Problem: Safety: Goal: Ability to remain free from injury will improve Outcome: Progressing   

## 2021-10-06 NOTE — Progress Notes (Signed)
?PROGRESS NOTE ? ? ? ?Elizabeth Mccullough  MPN:[id number] DOB: 08-06-1940 DOA: 09/29/2021 ?PCP: Ann Held, DO  ? ? ? ?Brief Narrative:  ?81 year old female from independent living facility with history of memory deficit/dementia easily gets sundowning around 3 to 4 PM, Parkinson's disease, hypertension, chronic combined systolic diastolic CHF, anxiety/depression on Xanax, HLD, HTN, asymptomatic ascending aortic aneurysm stable at 4.2 cm for more than 5 years brought to the ED after she was found on the floor of the independent living facility.  Patient reports that she slipped and fell was down for "20 minutes".  She was having left hip pain and [facility] where she was found to have left intertrochanteric hip fracture and was sent to the ED for evaluation and admission. Last echo 10/21 with EF 30-35%, G1 DD, moderate to severe AR. ? ?In the ED vitals stable, labs showed stable CBC BMP.  Chest x-ray clear ascending thoracic aortic aneurysm again noted.  X-ray of the hip displaced and comminuted intertrochanteric fracture of the left hip, CT of the hip with severely comminuted left intertrochanteric fracture with apex superolateral angulation, intramuscular hematoma in the left gluteus medius muscle. ? ?She was admitted for left hip fracture, s/p IM fixation on 3/15 by Dr. Lyla Glassing.  Medically optimized for discharge to SNF, has a bed available, level 2 PASSR has been approved and now awaiting insurance authorization. ? ? ?Subjective: ? ?Poor historian due to underlying dementia.  Alert and oriented to self, not to place and states that she is at "[place]" but knows that she had hip surgery.  Then kept calling a couple times "[place]" and stated that that is her caregiver.  Denies pain or any complaints.  Denies dyspnea.  As per RN, no acute issues reported. ? ?Assessment & Plan: ? Principal Problem: ?  Closed left hip fracture ([place]) ?Active Problems: ?  Hyperlipidemia ?   Parkinson disease ([place]) ?  Chronic combined systolic and diastolic CHF (congestive heart failure) ([place]) ?  NICM (nonischemic cardiomyopathy) ([place]) ?  Aneurysm of ascending aorta ?  Memory deficit ?  Anxiety and depression ?  Essential hypertension ? ? ? ?Assessment and Plan: ? ?Closed left hip fracture/Comminuted Left peritrochanteric femur fracture ?-Patient is from a independent living facility, reports that she slipped and fell was down for "20 minutes".  ?-S/p Intramedullary fixation Left femur on 3/15 by Dr. Lyla Glassing ?-Postop pain management, wound care, weightbearing status, DVT prophylaxis per Ortho, appreciate Ortho input ?-Medically optimized for DC to SNF.  Now has a bed available, level 2 PASSR has been approved and awaiting insurance authorization. ? ?Fever 100.4 on postop day 1 on 3/16, has since been afebrile  ?No leukocytosis, blood culture no growth to date,Chest x-ray no acute findings ?Urine culture negative. ? Finished empirically abx treatment with rocephin then keflex, last dose on 3/19. ? ?Acute on chronic  anemia, normocytic ?-S/p prbc transfusion x1  on 3/15 prior to going to OR, then prbc x1 on 3/17 ?-Does not appear to have acute external bleed, RN reports stool is light brown, FOBT ordered, pending collection ?B12: 346.  Do not see a clear indication for folate or B12, discontinued. ?Presented with hemoglobin of 10 on 3/14 which dropped to a low of 7.2 and now improved, up to 9.2 on 3/21. ?Follow CBC closely as outpatient. ? ?AKI on CKDII ?Treated with abx for possible UTI, ?Cr peaked at 1.28, cr down to 0.8-0.9 for the last 3 days. ?resumed acei on  3/19 and creatinine has remained stable ?Follow BMP periodically as outpatient. ? ?Acute on Chronic combined CHF/NICM  ?Repeat echo EF 40% to 30%, grade 1 diastolic dysfunction ?Continue beta-blocker,  ?ACE inhibitor held initially due to [facility], resumed on 3/19 ?wheezing resolved with Iv lasix , cxr no acute findings, there is no edema on  exam, ?Continue prior home dose of oral Lasix. ?Clinically euvolemic. ?Outpatient follow-up with PCP/cardiology. ? ?Hypertension ?Continue beta-blocker,  ?ACE inhibitor held initially due to [facility], resumed on 3/19 ?Reasonably controlled. ? ?Aneurysm of ascending aorta ?she was previously seen by CT surgery appeared present for more than 5 years and felt to be stable.  Currently denies chest pain ?Outpatient follow-up as deemed necessary. ? ?Hyperlipidemia ?Continue statin ? ?Parkinson's disease ?memory deficit/dementia  ?Anxiety/depression ?daughter reports she has mild dementia, easily gets sundowning around 3 to 4 PM,  ?She is on chronic Xanax resumed, will need to use as needed medication for agitation ?Daughter does not think patient will have bad delirium in the hospital, will monitor, delirium prevention protocol order placed ?She is pleasantly confused today during encounter.  No agitation. ? ?Body mass index is 26.21 kg/m?Marland Kitchen. ?  ? ?DVT prophylaxis: enoxaparin (LOVENOX) injection 40 mg Start: 10/05/21 0800 ?SCDs Start: 09/30/21 1551 ?  Code Status: DNR ?Family Communication: None at bedside. ?Disposition:  ? ?Status is: Inpatient ? ? Dispo: The patient is from: independent living  ?             Anticipated d/c is to: snf  ?             Anticipated d/c date is: snf, family prefers [facility] in [place], medically stable to discharge, has an SNF bed, has approved level 2 PASSR and awaiting insurance authorization. ? ?Antimicrobials:   ?Anti-infectives (From admission, onward)  ? ? Start     Dose/Rate Route Frequency Ordered Stop  ? 10/04/21 2200  cephALEXin (KEFLEX) capsule 500 mg       ? 500 mg Oral Every 12 hours 10/04/21 1331 10/04/21 2147  ? 10/03/21 1415  cephALEXin (KEFLEX) capsule 500 mg  Status:  Discontinued       ? 500 mg Oral Every 12 hours 10/03/21 1316 10/04/21 1331  ? 10/01/21 1800  cefTRIAXone (ROCEPHIN) 1 g in sodium chloride 0.9 % 100 mL IVPB  Status:  Discontinued       ? 1 g ?200 mL/hr over  30 Minutes Intravenous Every 24 hours 10/01/21 1630 10/03/21 1315  ? 10/01/21 1715  cefTRIAXone (ROCEPHIN) 1 g in sodium chloride 0.9 % 100 mL IVPB  Status:  Discontinued       ? 1 g ?200 mL/hr over 30 Minutes Intravenous Every 24 hours 10/01/21 1628 10/01/21 1629  ? 09/30/21 1845  ceFAZolin (ANCEF) IVPB 2g/100 mL premix       ? 2 g ?200 mL/hr over 30 Minutes Intravenous Every 6 hours 09/30/21 1550 10/01/21 0019  ? 09/30/21 1045  ceFAZolin (ANCEF) IVPB 2g/100 mL premix       ? 2 g ?200 mL/hr over 30 Minutes Intravenous On call to O.R. 09/30/21 0722 09/30/21 1251  ? ?  ? ? ? ? ? ? ?Objective: ?Vitals:  ? 10/05/21 0829 10/05/21 1456 10/05/21 2059 10/06/21 0546  ?BP: (!) 141/65 (!) 138/57 (!) 115/57 139/68  ?Pulse: 74 79 76 70  ?Resp: '16 18 16 16  '$ ?Temp: 98.3 ?F (36.8 ?C) 98.2 ?F (36.8 ?C) 98.8 ?F (37.1 ?C) 98.3 ?F (36.8 ?C)  ?TempSrc: Oral Oral  Oral Oral  ?SpO2: 99% 97% 95% 96%  ?Weight:      ?Height:      ? ? ?Intake/Output Summary (Last 24 hours) at 10/06/2021 1058 ?Last data filed at 10/06/2021 0900 ?Gross per 24 hour  ?Intake 600 ml  ?Output 1050 ml  ?Net -450 ml  ? ?Filed Weights  ? 09/29/21 1254 09/30/21 0500  ?Weight: 54.9 kg 65 kg  ? ? ?Examination: ? ?General exam: Elderly female, moderately built and nourished lying comfortably propped up in bed without distress. ?Respiratory system: Slightly diminished breath sounds in the bases but otherwise clear to auscultation.  No increased work of breathing.  No change compared to yesterday/stable. ?Cardiovascular system: S1 and S2 heard, RRR.  No JVD, or pedal edema.  Grade 2/6 short systolic murmur heard at apex. ?Gastrointestinal system: Abdomen is nondistended, soft and nontender.  Normal bowel sounds heard. ?Central nervous system: Alert and oriented only to person.  Follows simple instructions.  No focal deficits noted. ?Extremities: Left hip surgical site dressing clean and dry. ?Skin: No rashes, lesions or ulcers ?Psychiatry: calm, no agitation.  Flat  affect. ? ? ? ?Data Reviewed: I have personally reviewed  labs and visualized  imaging studies since the last encounter and formulate the plan  ? ?Lab works from 3/21: BMP unremarkable.  CBC shows hemoglobin of 9.2, up from

## 2021-10-06 NOTE — Progress Notes (Signed)
Physical Therapy Treatment ?Patient Details ?Name: Elizabeth Mccullough ?MRN: [id number] ?DOB: June 01, 1941 ?Today's Date: 10/06/2021 ? ? ?History of Present Illness Patient is an 81 year old female found down on floor at [facility], admitted with L hip fracture. S/p IM 3/15. PMH:  memory deficit/dementia easily gets sundowning around 3 to 4 PM, Parkinson's disease, hypertension, chronic combined systolic diastolic CHF, anxiety/depression on Xanax, HLD, HTN, asymptomatic ascending aortic aneurysm stable at 4.2 cm for more than 5 years ? ?  ?PT Comments  ? ? Pt cooperative with PT. Follows ~ 50% functional commands. Repeated STS, pt with posterior and R lateral lean in standing. Pt will need SNF post acute   ?Recommendations for follow up therapy are one component of a multi-disciplinary discharge planning process, led by the attending physician.  Recommendations may be updated based on patient status, additional functional criteria and insurance authorization. ? ?Follow Up Recommendations ? Skilled nursing-short term rehab (<3 hours/day) ?  ?  ?Assistance Recommended at Discharge Frequent or constant Supervision/Assistance  ?Patient can return home with the following A lot of help with walking and/or transfers;A lot of help with bathing/dressing/bathroom;Assist for transportation;Help with stairs or ramp for entrance;Assistance with cooking/housework ?  ?Equipment Recommendations ? None recommended by PT  ?  ?Recommendations for Other Services   ? ? ?  ?Precautions / Restrictions Precautions ?Precautions: Fall ?Restrictions ?Weight Bearing Restrictions: No ?LLE Weight Bearing: Weight bearing as tolerated ?Other Position/Activity Restrictions: WBAT for transfers only  ?  ? ?Mobility ? Bed Mobility ?Overal bed mobility: Needs Assistance ?Bed Mobility: Supine to Sit ?  ?  ?Supine to sit: Min assist, Mod assist ?  ?  ?General bed mobility comments: incr time, pt holding PT  hands, able to guide anterior translation of trunk to  sitting ?  ? ?Transfers ?Overall transfer level: Needs assistance ?  ?Transfers: Sit to/from Stand, Bed to chair/wheelchair/BSC ?Sit to Stand: Mod assist, Max assist ?  ?  ?  ?  ?  ?General transfer comment: cues for LE management and use of UEs to self assist.  Physical assist to bring wt up and fwd and to balance in intial standing ?Transfer via Lift Equipment: Stedy ? ?Ambulation/Gait ?  ?  ?  ?  ?  ?  ?  ?  ? ? ?Stairs ?  ?  ?  ?  ?  ? ? ?Wheelchair Mobility ?  ? ?Modified Rankin (Stroke Patients Only) ?  ? ? ?  ?Balance   ?Sitting-balance support: Feet supported ?Sitting balance-Leahy Scale: Poor ?  ?Postural control: Posterior lean, Right lateral lean ?Standing balance support: During functional activity, Reliant on assistive device for balance, Bilateral upper extremity supported ?Standing balance-Leahy Scale: Poor ?Standing balance comment: reliant on UEs and external assist ?  ?  ?  ?  ?  ?  ?  ?  ?  ?  ?  ?  ? ?  ?Cognition Arousal/Alertness: Awake/alert ?Behavior During Therapy: Flat affect ?Overall Cognitive Status: History of cognitive impairments - at baseline ?  ?  ?  ?  ?  ?  ?  ?  ?  ?  ?  ?  ?  ?  ?  ?  ?  ?  ?  ? ?  ?Exercises   ? ?  ?General Comments   ?  ?  ? ?Pertinent Vitals/Pain Pain Assessment ?Pain Assessment: Faces ?Faces Pain Scale: Hurts a little bit ?Pain Location: L hip ?Pain Descriptors / Indicators: Grimacing ?Pain Intervention(s): Limited  activity within patient's tolerance, Monitored during session, Repositioned  ? ? ?Home Living   ?  ?  ?  ?  ?  ?  ?  ?  ?  ?   ?  ?Prior Function    ?  ?  ?   ? ?PT Goals (current goals can now be found in the care plan section) Acute Rehab PT Goals ?Patient Stated Goal: Regain IND ?PT Goal Formulation: With patient ?Time For Goal Achievement: 10/15/21 ?Potential to Achieve Goals: Fair ?Progress towards PT goals: Progressing toward goals ? ?  ?Frequency ? ? ? Min 3X/week ? ? ? ?  ?PT Plan Current plan remains appropriate  ? ? ?Co-evaluation   ?  ?   ?  ?  ? ?  ?AM-PAC PT "6 Clicks" Mobility   ?Outcome Measure ? Help needed turning from your back to your side while in a flat bed without using bedrails?: A Lot ?Help needed moving from lying on your back to sitting on the side of a flat bed without using bedrails?: A Lot ?Help needed moving to and from a bed to a chair (including a wheelchair)?: A Lot ?Help needed standing up from a chair using your arms (e.g., wheelchair or bedside chair)?: A Lot ?Help needed to walk in hospital room?: Total ?Help needed climbing 3-5 steps with a railing? : Total ?6 Click Score: 10 ? ?  ?End of Session Equipment Utilized During Treatment: Gait belt;Other (comment) (stedy) ?Activity Tolerance: Patient tolerated treatment well ?Patient left: in chair;with call bell/phone within reach;with chair alarm set ?Nurse Communication: Mobility status ?PT Visit Diagnosis: Unsteadiness on feet (R26.81);Difficulty in walking, not elsewhere classified (R26.2);Pain ?Pain - Right/Left: Left ?Pain - part of body: Hip ?  ? ? ?Time: 1248-1310 ?PT Time Calculation (min) (ACUTE ONLY): 22 min ? ?Charges:  $Therapeutic Activity: 8-22 mins          ?          ? Baxter Flattery, PT ? ?Acute Rehab Dept [facility]) [phone] ?Pager [phone] ? ?10/06/2021 ? ? ? ?Elizabeth Mccullough ?10/06/2021, 1:20 PM ? ?

## 2021-10-08 DIAGNOSIS — D649 Anemia, unspecified: Secondary | ICD-10-CM | POA: Diagnosis not present

## 2021-10-08 DIAGNOSIS — R269 Unspecified abnormalities of gait and mobility: Secondary | ICD-10-CM | POA: Diagnosis not present

## 2021-10-08 DIAGNOSIS — S72302B Unspecified fracture of shaft of left femur, initial encounter for open fracture type I or II: Secondary | ICD-10-CM | POA: Diagnosis not present

## 2021-10-08 DIAGNOSIS — G20 Parkinson's disease: Secondary | ICD-10-CM | POA: Diagnosis not present

## 2021-10-13 DIAGNOSIS — I10 Essential (primary) hypertension: Secondary | ICD-10-CM | POA: Diagnosis not present

## 2021-10-13 DIAGNOSIS — S72001A Fracture of unspecified part of neck of right femur, initial encounter for closed fracture: Secondary | ICD-10-CM | POA: Diagnosis not present

## 2021-10-13 DIAGNOSIS — G20 Parkinson's disease: Secondary | ICD-10-CM | POA: Diagnosis not present

## 2021-10-13 DIAGNOSIS — E119 Type 2 diabetes mellitus without complications: Secondary | ICD-10-CM | POA: Diagnosis not present

## 2021-10-13 DIAGNOSIS — I5040 Unspecified combined systolic (congestive) and diastolic (congestive) heart failure: Secondary | ICD-10-CM | POA: Diagnosis not present

## 2021-10-13 DIAGNOSIS — M6281 Muscle weakness (generalized): Secondary | ICD-10-CM | POA: Diagnosis not present

## 2021-10-13 DIAGNOSIS — D508 Other iron deficiency anemias: Secondary | ICD-10-CM | POA: Diagnosis not present

## 2021-10-13 DIAGNOSIS — N390 Urinary tract infection, site not specified: Secondary | ICD-10-CM | POA: Diagnosis not present

## 2021-10-13 DIAGNOSIS — S72002A Fracture of unspecified part of neck of left femur, initial encounter for closed fracture: Secondary | ICD-10-CM | POA: Diagnosis not present

## 2021-10-13 DIAGNOSIS — S72142A Displaced intertrochanteric fracture of left femur, initial encounter for closed fracture: Secondary | ICD-10-CM | POA: Diagnosis not present

## 2021-10-13 DIAGNOSIS — Z4789 Encounter for other orthopedic aftercare: Secondary | ICD-10-CM | POA: Diagnosis not present

## 2021-10-13 DIAGNOSIS — F0394 Unspecified dementia, unspecified severity, with anxiety: Secondary | ICD-10-CM | POA: Diagnosis not present

## 2021-10-13 DIAGNOSIS — Z4732 Aftercare following explantation of hip joint prosthesis: Secondary | ICD-10-CM | POA: Diagnosis not present

## 2021-10-13 DIAGNOSIS — R41841 Cognitive communication deficit: Secondary | ICD-10-CM | POA: Diagnosis not present

## 2021-10-13 DIAGNOSIS — D649 Anemia, unspecified: Secondary | ICD-10-CM | POA: Diagnosis not present

## 2021-10-13 DIAGNOSIS — R278 Other lack of coordination: Secondary | ICD-10-CM | POA: Diagnosis not present

## 2021-10-13 DIAGNOSIS — R2689 Other abnormalities of gait and mobility: Secondary | ICD-10-CM | POA: Diagnosis not present

## 2021-10-13 DIAGNOSIS — S72002D Fracture of unspecified part of neck of left femur, subsequent encounter for closed fracture with routine healing: Secondary | ICD-10-CM | POA: Diagnosis not present

## 2021-10-13 DIAGNOSIS — R4182 Altered mental status, unspecified: Secondary | ICD-10-CM | POA: Diagnosis not present

## 2021-10-13 DIAGNOSIS — S72142D Displaced intertrochanteric fracture of left femur, subsequent encounter for closed fracture with routine healing: Secondary | ICD-10-CM | POA: Diagnosis not present

## 2021-10-14 DIAGNOSIS — G20 Parkinson's disease: Secondary | ICD-10-CM | POA: Diagnosis not present

## 2021-10-14 DIAGNOSIS — S72001A Fracture of unspecified part of neck of right femur, initial encounter for closed fracture: Secondary | ICD-10-CM | POA: Diagnosis not present

## 2021-10-14 DIAGNOSIS — N390 Urinary tract infection, site not specified: Secondary | ICD-10-CM | POA: Diagnosis not present

## 2021-10-14 DIAGNOSIS — D508 Other iron deficiency anemias: Secondary | ICD-10-CM | POA: Diagnosis not present

## 2021-10-15 DIAGNOSIS — S72001A Fracture of unspecified part of neck of right femur, initial encounter for closed fracture: Secondary | ICD-10-CM | POA: Diagnosis not present

## 2021-10-15 DIAGNOSIS — G20 Parkinson's disease: Secondary | ICD-10-CM | POA: Diagnosis not present

## 2021-10-15 DIAGNOSIS — N390 Urinary tract infection, site not specified: Secondary | ICD-10-CM | POA: Diagnosis not present

## 2021-10-15 DIAGNOSIS — D508 Other iron deficiency anemias: Secondary | ICD-10-CM | POA: Diagnosis not present

## 2021-10-16 DIAGNOSIS — S72001A Fracture of unspecified part of neck of right femur, initial encounter for closed fracture: Secondary | ICD-10-CM | POA: Diagnosis not present

## 2021-10-16 DIAGNOSIS — D508 Other iron deficiency anemias: Secondary | ICD-10-CM | POA: Diagnosis not present

## 2021-10-16 DIAGNOSIS — Z4732 Aftercare following explantation of hip joint prosthesis: Secondary | ICD-10-CM | POA: Diagnosis not present

## 2021-10-16 DIAGNOSIS — F0394 Unspecified dementia, unspecified severity, with anxiety: Secondary | ICD-10-CM | POA: Diagnosis not present

## 2021-10-16 DIAGNOSIS — N390 Urinary tract infection, site not specified: Secondary | ICD-10-CM | POA: Diagnosis not present

## 2021-10-16 DIAGNOSIS — S72142D Displaced intertrochanteric fracture of left femur, subsequent encounter for closed fracture with routine healing: Secondary | ICD-10-CM | POA: Diagnosis not present

## 2021-10-19 DIAGNOSIS — F0394 Unspecified dementia, unspecified severity, with anxiety: Secondary | ICD-10-CM | POA: Diagnosis not present

## 2021-10-19 DIAGNOSIS — S72001A Fracture of unspecified part of neck of right femur, initial encounter for closed fracture: Secondary | ICD-10-CM | POA: Diagnosis not present

## 2021-10-19 DIAGNOSIS — N390 Urinary tract infection, site not specified: Secondary | ICD-10-CM | POA: Diagnosis not present

## 2021-10-19 DIAGNOSIS — D508 Other iron deficiency anemias: Secondary | ICD-10-CM | POA: Diagnosis not present

## 2021-10-21 DIAGNOSIS — N390 Urinary tract infection, site not specified: Secondary | ICD-10-CM | POA: Diagnosis not present

## 2021-10-21 DIAGNOSIS — F0394 Unspecified dementia, unspecified severity, with anxiety: Secondary | ICD-10-CM | POA: Diagnosis not present

## 2021-10-21 DIAGNOSIS — D508 Other iron deficiency anemias: Secondary | ICD-10-CM | POA: Diagnosis not present

## 2021-10-21 DIAGNOSIS — S72001A Fracture of unspecified part of neck of right femur, initial encounter for closed fracture: Secondary | ICD-10-CM | POA: Diagnosis not present

## 2021-10-23 DIAGNOSIS — N390 Urinary tract infection, site not specified: Secondary | ICD-10-CM | POA: Diagnosis not present

## 2021-10-23 DIAGNOSIS — D508 Other iron deficiency anemias: Secondary | ICD-10-CM | POA: Diagnosis not present

## 2021-10-23 DIAGNOSIS — G20 Parkinson's disease: Secondary | ICD-10-CM | POA: Diagnosis not present

## 2021-10-23 DIAGNOSIS — S72001A Fracture of unspecified part of neck of right femur, initial encounter for closed fracture: Secondary | ICD-10-CM | POA: Diagnosis not present

## 2021-10-26 DIAGNOSIS — S72001A Fracture of unspecified part of neck of right femur, initial encounter for closed fracture: Secondary | ICD-10-CM | POA: Diagnosis not present

## 2021-10-26 DIAGNOSIS — G20 Parkinson's disease: Secondary | ICD-10-CM | POA: Diagnosis not present

## 2021-10-26 DIAGNOSIS — N390 Urinary tract infection, site not specified: Secondary | ICD-10-CM | POA: Diagnosis not present

## 2021-10-26 DIAGNOSIS — D508 Other iron deficiency anemias: Secondary | ICD-10-CM | POA: Diagnosis not present

## 2021-10-30 ENCOUNTER — Telehealth: Payer: Self-pay | Admitting: *Deleted

## 2021-10-30 ENCOUNTER — Other Ambulatory Visit: Payer: Self-pay | Admitting: Family Medicine

## 2021-10-30 DIAGNOSIS — I10 Essential (primary) hypertension: Secondary | ICD-10-CM

## 2021-10-30 NOTE — Telephone Encounter (Signed)
Spoke with pt daughter [name] and advised her that pt needed a follow up appointment and she stated that patient was in rehab and that she will call when to make appt when she gets out. ?

## 2021-11-01 DIAGNOSIS — R2689 Other abnormalities of gait and mobility: Secondary | ICD-10-CM | POA: Diagnosis not present

## 2021-11-01 DIAGNOSIS — M6281 Muscle weakness (generalized): Secondary | ICD-10-CM | POA: Diagnosis not present

## 2021-11-01 DIAGNOSIS — Z4789 Encounter for other orthopedic aftercare: Secondary | ICD-10-CM | POA: Diagnosis not present

## 2021-11-01 DIAGNOSIS — S72142D Displaced intertrochanteric fracture of left femur, subsequent encounter for closed fracture with routine healing: Secondary | ICD-10-CM | POA: Diagnosis not present

## 2021-11-01 DIAGNOSIS — S72002D Fracture of unspecified part of neck of left femur, subsequent encounter for closed fracture with routine healing: Secondary | ICD-10-CM | POA: Diagnosis not present

## 2021-11-01 DIAGNOSIS — R278 Other lack of coordination: Secondary | ICD-10-CM | POA: Diagnosis not present

## 2021-11-01 DIAGNOSIS — S72142A Displaced intertrochanteric fracture of left femur, initial encounter for closed fracture: Secondary | ICD-10-CM | POA: Diagnosis not present

## 2021-11-01 DIAGNOSIS — R41841 Cognitive communication deficit: Secondary | ICD-10-CM | POA: Diagnosis not present

## 2021-11-01 DIAGNOSIS — R4182 Altered mental status, unspecified: Secondary | ICD-10-CM | POA: Diagnosis not present

## 2021-11-02 DIAGNOSIS — R278 Other lack of coordination: Secondary | ICD-10-CM | POA: Diagnosis not present

## 2021-11-02 DIAGNOSIS — Z4789 Encounter for other orthopedic aftercare: Secondary | ICD-10-CM | POA: Diagnosis not present

## 2021-11-02 DIAGNOSIS — S72142A Displaced intertrochanteric fracture of left femur, initial encounter for closed fracture: Secondary | ICD-10-CM | POA: Diagnosis not present

## 2021-11-02 DIAGNOSIS — S72142D Displaced intertrochanteric fracture of left femur, subsequent encounter for closed fracture with routine healing: Secondary | ICD-10-CM | POA: Diagnosis not present

## 2021-11-02 DIAGNOSIS — R41841 Cognitive communication deficit: Secondary | ICD-10-CM | POA: Diagnosis not present

## 2021-11-02 DIAGNOSIS — M6281 Muscle weakness (generalized): Secondary | ICD-10-CM | POA: Diagnosis not present

## 2021-11-02 DIAGNOSIS — R2689 Other abnormalities of gait and mobility: Secondary | ICD-10-CM | POA: Diagnosis not present

## 2021-11-02 DIAGNOSIS — S72002D Fracture of unspecified part of neck of left femur, subsequent encounter for closed fracture with routine healing: Secondary | ICD-10-CM | POA: Diagnosis not present

## 2021-11-02 DIAGNOSIS — R4182 Altered mental status, unspecified: Secondary | ICD-10-CM | POA: Diagnosis not present

## 2021-11-03 ENCOUNTER — Encounter: Payer: Self-pay | Admitting: Family Medicine

## 2021-11-03 DIAGNOSIS — Z4789 Encounter for other orthopedic aftercare: Secondary | ICD-10-CM | POA: Diagnosis not present

## 2021-11-03 DIAGNOSIS — M6281 Muscle weakness (generalized): Secondary | ICD-10-CM | POA: Diagnosis not present

## 2021-11-03 DIAGNOSIS — R41841 Cognitive communication deficit: Secondary | ICD-10-CM | POA: Diagnosis not present

## 2021-11-03 DIAGNOSIS — R4182 Altered mental status, unspecified: Secondary | ICD-10-CM | POA: Diagnosis not present

## 2021-11-03 DIAGNOSIS — S72142A Displaced intertrochanteric fracture of left femur, initial encounter for closed fracture: Secondary | ICD-10-CM | POA: Diagnosis not present

## 2021-11-03 DIAGNOSIS — R278 Other lack of coordination: Secondary | ICD-10-CM | POA: Diagnosis not present

## 2021-11-03 DIAGNOSIS — S72002D Fracture of unspecified part of neck of left femur, subsequent encounter for closed fracture with routine healing: Secondary | ICD-10-CM | POA: Diagnosis not present

## 2021-11-03 DIAGNOSIS — R2689 Other abnormalities of gait and mobility: Secondary | ICD-10-CM | POA: Diagnosis not present

## 2021-11-03 DIAGNOSIS — S72142D Displaced intertrochanteric fracture of left femur, subsequent encounter for closed fracture with routine healing: Secondary | ICD-10-CM | POA: Diagnosis not present

## 2021-11-04 DIAGNOSIS — R41841 Cognitive communication deficit: Secondary | ICD-10-CM | POA: Diagnosis not present

## 2021-11-04 DIAGNOSIS — R2689 Other abnormalities of gait and mobility: Secondary | ICD-10-CM | POA: Diagnosis not present

## 2021-11-04 DIAGNOSIS — S72002D Fracture of unspecified part of neck of left femur, subsequent encounter for closed fracture with routine healing: Secondary | ICD-10-CM | POA: Diagnosis not present

## 2021-11-04 DIAGNOSIS — M6281 Muscle weakness (generalized): Secondary | ICD-10-CM | POA: Diagnosis not present

## 2021-11-04 DIAGNOSIS — Z4789 Encounter for other orthopedic aftercare: Secondary | ICD-10-CM | POA: Diagnosis not present

## 2021-11-04 DIAGNOSIS — R4182 Altered mental status, unspecified: Secondary | ICD-10-CM | POA: Diagnosis not present

## 2021-11-04 DIAGNOSIS — R278 Other lack of coordination: Secondary | ICD-10-CM | POA: Diagnosis not present

## 2021-11-04 DIAGNOSIS — S72142D Displaced intertrochanteric fracture of left femur, subsequent encounter for closed fracture with routine healing: Secondary | ICD-10-CM | POA: Diagnosis not present

## 2021-11-04 DIAGNOSIS — S72142A Displaced intertrochanteric fracture of left femur, initial encounter for closed fracture: Secondary | ICD-10-CM | POA: Diagnosis not present

## 2021-11-05 DIAGNOSIS — R4182 Altered mental status, unspecified: Secondary | ICD-10-CM | POA: Diagnosis not present

## 2021-11-05 DIAGNOSIS — R278 Other lack of coordination: Secondary | ICD-10-CM | POA: Diagnosis not present

## 2021-11-05 DIAGNOSIS — S72142D Displaced intertrochanteric fracture of left femur, subsequent encounter for closed fracture with routine healing: Secondary | ICD-10-CM | POA: Diagnosis not present

## 2021-11-05 DIAGNOSIS — R2689 Other abnormalities of gait and mobility: Secondary | ICD-10-CM | POA: Diagnosis not present

## 2021-11-05 DIAGNOSIS — R41841 Cognitive communication deficit: Secondary | ICD-10-CM | POA: Diagnosis not present

## 2021-11-05 DIAGNOSIS — M6281 Muscle weakness (generalized): Secondary | ICD-10-CM | POA: Diagnosis not present

## 2021-11-05 DIAGNOSIS — Z4789 Encounter for other orthopedic aftercare: Secondary | ICD-10-CM | POA: Diagnosis not present

## 2021-11-05 DIAGNOSIS — S72142A Displaced intertrochanteric fracture of left femur, initial encounter for closed fracture: Secondary | ICD-10-CM | POA: Diagnosis not present

## 2021-11-05 DIAGNOSIS — S72002D Fracture of unspecified part of neck of left femur, subsequent encounter for closed fracture with routine healing: Secondary | ICD-10-CM | POA: Diagnosis not present

## 2021-11-06 DIAGNOSIS — M6281 Muscle weakness (generalized): Secondary | ICD-10-CM | POA: Diagnosis not present

## 2021-11-06 DIAGNOSIS — R4182 Altered mental status, unspecified: Secondary | ICD-10-CM | POA: Diagnosis not present

## 2021-11-06 DIAGNOSIS — Z4789 Encounter for other orthopedic aftercare: Secondary | ICD-10-CM | POA: Diagnosis not present

## 2021-11-06 DIAGNOSIS — R2689 Other abnormalities of gait and mobility: Secondary | ICD-10-CM | POA: Diagnosis not present

## 2021-11-06 DIAGNOSIS — S72002D Fracture of unspecified part of neck of left femur, subsequent encounter for closed fracture with routine healing: Secondary | ICD-10-CM | POA: Diagnosis not present

## 2021-11-06 DIAGNOSIS — R41841 Cognitive communication deficit: Secondary | ICD-10-CM | POA: Diagnosis not present

## 2021-11-06 DIAGNOSIS — R278 Other lack of coordination: Secondary | ICD-10-CM | POA: Diagnosis not present

## 2021-11-06 DIAGNOSIS — S72142D Displaced intertrochanteric fracture of left femur, subsequent encounter for closed fracture with routine healing: Secondary | ICD-10-CM | POA: Diagnosis not present

## 2021-11-06 DIAGNOSIS — S72142A Displaced intertrochanteric fracture of left femur, initial encounter for closed fracture: Secondary | ICD-10-CM | POA: Diagnosis not present

## 2021-11-07 DIAGNOSIS — S72002D Fracture of unspecified part of neck of left femur, subsequent encounter for closed fracture with routine healing: Secondary | ICD-10-CM | POA: Diagnosis not present

## 2021-11-07 DIAGNOSIS — R278 Other lack of coordination: Secondary | ICD-10-CM | POA: Diagnosis not present

## 2021-11-07 DIAGNOSIS — R2689 Other abnormalities of gait and mobility: Secondary | ICD-10-CM | POA: Diagnosis not present

## 2021-11-07 DIAGNOSIS — Z4789 Encounter for other orthopedic aftercare: Secondary | ICD-10-CM | POA: Diagnosis not present

## 2021-11-07 DIAGNOSIS — R4182 Altered mental status, unspecified: Secondary | ICD-10-CM | POA: Diagnosis not present

## 2021-11-07 DIAGNOSIS — M6281 Muscle weakness (generalized): Secondary | ICD-10-CM | POA: Diagnosis not present

## 2021-11-07 DIAGNOSIS — S72142A Displaced intertrochanteric fracture of left femur, initial encounter for closed fracture: Secondary | ICD-10-CM | POA: Diagnosis not present

## 2021-11-07 DIAGNOSIS — S72142D Displaced intertrochanteric fracture of left femur, subsequent encounter for closed fracture with routine healing: Secondary | ICD-10-CM | POA: Diagnosis not present

## 2021-11-07 DIAGNOSIS — R41841 Cognitive communication deficit: Secondary | ICD-10-CM | POA: Diagnosis not present

## 2021-11-08 DIAGNOSIS — R278 Other lack of coordination: Secondary | ICD-10-CM | POA: Diagnosis not present

## 2021-11-08 DIAGNOSIS — S72142D Displaced intertrochanteric fracture of left femur, subsequent encounter for closed fracture with routine healing: Secondary | ICD-10-CM | POA: Diagnosis not present

## 2021-11-08 DIAGNOSIS — R2689 Other abnormalities of gait and mobility: Secondary | ICD-10-CM | POA: Diagnosis not present

## 2021-11-08 DIAGNOSIS — Z4789 Encounter for other orthopedic aftercare: Secondary | ICD-10-CM | POA: Diagnosis not present

## 2021-11-08 DIAGNOSIS — S72142A Displaced intertrochanteric fracture of left femur, initial encounter for closed fracture: Secondary | ICD-10-CM | POA: Diagnosis not present

## 2021-11-08 DIAGNOSIS — S72002D Fracture of unspecified part of neck of left femur, subsequent encounter for closed fracture with routine healing: Secondary | ICD-10-CM | POA: Diagnosis not present

## 2021-11-08 DIAGNOSIS — R41841 Cognitive communication deficit: Secondary | ICD-10-CM | POA: Diagnosis not present

## 2021-11-08 DIAGNOSIS — M6281 Muscle weakness (generalized): Secondary | ICD-10-CM | POA: Diagnosis not present

## 2021-11-08 DIAGNOSIS — R4182 Altered mental status, unspecified: Secondary | ICD-10-CM | POA: Diagnosis not present

## 2021-11-09 DIAGNOSIS — R41841 Cognitive communication deficit: Secondary | ICD-10-CM | POA: Diagnosis not present

## 2021-11-09 DIAGNOSIS — R278 Other lack of coordination: Secondary | ICD-10-CM | POA: Diagnosis not present

## 2021-11-09 DIAGNOSIS — M6281 Muscle weakness (generalized): Secondary | ICD-10-CM | POA: Diagnosis not present

## 2021-11-09 DIAGNOSIS — Z4789 Encounter for other orthopedic aftercare: Secondary | ICD-10-CM | POA: Diagnosis not present

## 2021-11-09 DIAGNOSIS — S72142A Displaced intertrochanteric fracture of left femur, initial encounter for closed fracture: Secondary | ICD-10-CM | POA: Diagnosis not present

## 2021-11-09 DIAGNOSIS — S72002D Fracture of unspecified part of neck of left femur, subsequent encounter for closed fracture with routine healing: Secondary | ICD-10-CM | POA: Diagnosis not present

## 2021-11-09 DIAGNOSIS — R2689 Other abnormalities of gait and mobility: Secondary | ICD-10-CM | POA: Diagnosis not present

## 2021-11-09 DIAGNOSIS — R4182 Altered mental status, unspecified: Secondary | ICD-10-CM | POA: Diagnosis not present

## 2021-11-09 DIAGNOSIS — S72142D Displaced intertrochanteric fracture of left femur, subsequent encounter for closed fracture with routine healing: Secondary | ICD-10-CM | POA: Diagnosis not present

## 2021-11-10 DIAGNOSIS — R278 Other lack of coordination: Secondary | ICD-10-CM | POA: Diagnosis not present

## 2021-11-10 DIAGNOSIS — R4182 Altered mental status, unspecified: Secondary | ICD-10-CM | POA: Diagnosis not present

## 2021-11-10 DIAGNOSIS — S72142A Displaced intertrochanteric fracture of left femur, initial encounter for closed fracture: Secondary | ICD-10-CM | POA: Diagnosis not present

## 2021-11-10 DIAGNOSIS — M6281 Muscle weakness (generalized): Secondary | ICD-10-CM | POA: Diagnosis not present

## 2021-11-10 DIAGNOSIS — R296 Repeated falls: Secondary | ICD-10-CM | POA: Diagnosis not present

## 2021-11-10 DIAGNOSIS — Z4789 Encounter for other orthopedic aftercare: Secondary | ICD-10-CM | POA: Diagnosis not present

## 2021-11-10 DIAGNOSIS — R2689 Other abnormalities of gait and mobility: Secondary | ICD-10-CM | POA: Diagnosis not present

## 2021-11-10 DIAGNOSIS — S72002D Fracture of unspecified part of neck of left femur, subsequent encounter for closed fracture with routine healing: Secondary | ICD-10-CM | POA: Diagnosis not present

## 2021-11-10 DIAGNOSIS — R41841 Cognitive communication deficit: Secondary | ICD-10-CM | POA: Diagnosis not present

## 2021-11-10 DIAGNOSIS — S72142D Displaced intertrochanteric fracture of left femur, subsequent encounter for closed fracture with routine healing: Secondary | ICD-10-CM | POA: Diagnosis not present

## 2021-11-11 DIAGNOSIS — R41841 Cognitive communication deficit: Secondary | ICD-10-CM | POA: Diagnosis not present

## 2021-11-11 DIAGNOSIS — R278 Other lack of coordination: Secondary | ICD-10-CM | POA: Diagnosis not present

## 2021-11-11 DIAGNOSIS — S72142A Displaced intertrochanteric fracture of left femur, initial encounter for closed fracture: Secondary | ICD-10-CM | POA: Diagnosis not present

## 2021-11-11 DIAGNOSIS — R2689 Other abnormalities of gait and mobility: Secondary | ICD-10-CM | POA: Diagnosis not present

## 2021-11-11 DIAGNOSIS — S72002D Fracture of unspecified part of neck of left femur, subsequent encounter for closed fracture with routine healing: Secondary | ICD-10-CM | POA: Diagnosis not present

## 2021-11-11 DIAGNOSIS — S72142D Displaced intertrochanteric fracture of left femur, subsequent encounter for closed fracture with routine healing: Secondary | ICD-10-CM | POA: Diagnosis not present

## 2021-11-11 DIAGNOSIS — R4182 Altered mental status, unspecified: Secondary | ICD-10-CM | POA: Diagnosis not present

## 2021-11-11 DIAGNOSIS — M6281 Muscle weakness (generalized): Secondary | ICD-10-CM | POA: Diagnosis not present

## 2021-11-11 DIAGNOSIS — Z4789 Encounter for other orthopedic aftercare: Secondary | ICD-10-CM | POA: Diagnosis not present

## 2021-11-12 ENCOUNTER — Other Ambulatory Visit: Payer: Self-pay | Admitting: Family Medicine

## 2021-11-12 DIAGNOSIS — R4182 Altered mental status, unspecified: Secondary | ICD-10-CM | POA: Diagnosis not present

## 2021-11-12 DIAGNOSIS — S72142A Displaced intertrochanteric fracture of left femur, initial encounter for closed fracture: Secondary | ICD-10-CM | POA: Diagnosis not present

## 2021-11-12 DIAGNOSIS — S72142D Displaced intertrochanteric fracture of left femur, subsequent encounter for closed fracture with routine healing: Secondary | ICD-10-CM | POA: Diagnosis not present

## 2021-11-12 DIAGNOSIS — R2689 Other abnormalities of gait and mobility: Secondary | ICD-10-CM | POA: Diagnosis not present

## 2021-11-12 DIAGNOSIS — R509 Fever, unspecified: Secondary | ICD-10-CM | POA: Diagnosis not present

## 2021-11-12 DIAGNOSIS — E785 Hyperlipidemia, unspecified: Secondary | ICD-10-CM

## 2021-11-12 DIAGNOSIS — R799 Abnormal finding of blood chemistry, unspecified: Secondary | ICD-10-CM | POA: Diagnosis not present

## 2021-11-12 DIAGNOSIS — Z4789 Encounter for other orthopedic aftercare: Secondary | ICD-10-CM | POA: Diagnosis not present

## 2021-11-12 DIAGNOSIS — M6281 Muscle weakness (generalized): Secondary | ICD-10-CM | POA: Diagnosis not present

## 2021-11-12 DIAGNOSIS — U071 COVID-19: Secondary | ICD-10-CM | POA: Diagnosis not present

## 2021-11-12 DIAGNOSIS — R278 Other lack of coordination: Secondary | ICD-10-CM | POA: Diagnosis not present

## 2021-11-12 DIAGNOSIS — S72002D Fracture of unspecified part of neck of left femur, subsequent encounter for closed fracture with routine healing: Secondary | ICD-10-CM | POA: Diagnosis not present

## 2021-11-12 DIAGNOSIS — R41841 Cognitive communication deficit: Secondary | ICD-10-CM | POA: Diagnosis not present

## 2021-11-13 DIAGNOSIS — R4182 Altered mental status, unspecified: Secondary | ICD-10-CM | POA: Diagnosis not present

## 2021-11-13 DIAGNOSIS — S72142A Displaced intertrochanteric fracture of left femur, initial encounter for closed fracture: Secondary | ICD-10-CM | POA: Diagnosis not present

## 2021-11-13 DIAGNOSIS — D508 Other iron deficiency anemias: Secondary | ICD-10-CM | POA: Diagnosis not present

## 2021-11-13 DIAGNOSIS — S72002D Fracture of unspecified part of neck of left femur, subsequent encounter for closed fracture with routine healing: Secondary | ICD-10-CM | POA: Diagnosis not present

## 2021-11-13 DIAGNOSIS — S72142D Displaced intertrochanteric fracture of left femur, subsequent encounter for closed fracture with routine healing: Secondary | ICD-10-CM | POA: Diagnosis not present

## 2021-11-13 DIAGNOSIS — R2689 Other abnormalities of gait and mobility: Secondary | ICD-10-CM | POA: Diagnosis not present

## 2021-11-13 DIAGNOSIS — R41841 Cognitive communication deficit: Secondary | ICD-10-CM | POA: Diagnosis not present

## 2021-11-13 DIAGNOSIS — R278 Other lack of coordination: Secondary | ICD-10-CM | POA: Diagnosis not present

## 2021-11-13 DIAGNOSIS — Z4789 Encounter for other orthopedic aftercare: Secondary | ICD-10-CM | POA: Diagnosis not present

## 2021-11-13 DIAGNOSIS — N390 Urinary tract infection, site not specified: Secondary | ICD-10-CM | POA: Diagnosis not present

## 2021-11-13 DIAGNOSIS — G20 Parkinson's disease: Secondary | ICD-10-CM | POA: Diagnosis not present

## 2021-11-13 DIAGNOSIS — S72001A Fracture of unspecified part of neck of right femur, initial encounter for closed fracture: Secondary | ICD-10-CM | POA: Diagnosis not present

## 2021-11-13 DIAGNOSIS — M6281 Muscle weakness (generalized): Secondary | ICD-10-CM | POA: Diagnosis not present

## 2021-11-15 DIAGNOSIS — M6281 Muscle weakness (generalized): Secondary | ICD-10-CM | POA: Diagnosis not present

## 2021-11-15 DIAGNOSIS — S72002D Fracture of unspecified part of neck of left femur, subsequent encounter for closed fracture with routine healing: Secondary | ICD-10-CM | POA: Diagnosis not present

## 2021-11-15 DIAGNOSIS — S72142D Displaced intertrochanteric fracture of left femur, subsequent encounter for closed fracture with routine healing: Secondary | ICD-10-CM | POA: Diagnosis not present

## 2021-11-15 DIAGNOSIS — R4182 Altered mental status, unspecified: Secondary | ICD-10-CM | POA: Diagnosis not present

## 2021-11-15 DIAGNOSIS — R41841 Cognitive communication deficit: Secondary | ICD-10-CM | POA: Diagnosis not present

## 2021-11-15 DIAGNOSIS — S72142A Displaced intertrochanteric fracture of left femur, initial encounter for closed fracture: Secondary | ICD-10-CM | POA: Diagnosis not present

## 2021-11-15 DIAGNOSIS — Z4789 Encounter for other orthopedic aftercare: Secondary | ICD-10-CM | POA: Diagnosis not present

## 2021-11-15 DIAGNOSIS — R278 Other lack of coordination: Secondary | ICD-10-CM | POA: Diagnosis not present

## 2021-11-15 DIAGNOSIS — R2689 Other abnormalities of gait and mobility: Secondary | ICD-10-CM | POA: Diagnosis not present

## 2021-11-16 ENCOUNTER — Other Ambulatory Visit: Payer: Self-pay | Admitting: Family Medicine

## 2021-11-16 DIAGNOSIS — F419 Anxiety disorder, unspecified: Secondary | ICD-10-CM

## 2021-11-19 ENCOUNTER — Encounter: Payer: Medicare Other | Admitting: Family Medicine

## 2021-11-20 DIAGNOSIS — N390 Urinary tract infection, site not specified: Secondary | ICD-10-CM | POA: Diagnosis not present

## 2021-11-20 DIAGNOSIS — G20 Parkinson's disease: Secondary | ICD-10-CM | POA: Diagnosis not present

## 2021-11-20 DIAGNOSIS — M9702XD Periprosthetic fracture around internal prosthetic left hip joint, subsequent encounter: Secondary | ICD-10-CM | POA: Diagnosis not present

## 2021-11-20 DIAGNOSIS — D508 Other iron deficiency anemias: Secondary | ICD-10-CM | POA: Diagnosis not present

## 2021-11-20 DIAGNOSIS — S72001A Fracture of unspecified part of neck of right femur, initial encounter for closed fracture: Secondary | ICD-10-CM | POA: Diagnosis not present

## 2021-11-23 DIAGNOSIS — S72002D Fracture of unspecified part of neck of left femur, subsequent encounter for closed fracture with routine healing: Secondary | ICD-10-CM | POA: Diagnosis not present

## 2021-11-23 DIAGNOSIS — N170 Acute kidney failure with tubular necrosis: Secondary | ICD-10-CM | POA: Diagnosis not present

## 2021-11-23 DIAGNOSIS — I5032 Chronic diastolic (congestive) heart failure: Secondary | ICD-10-CM | POA: Diagnosis not present

## 2021-11-23 DIAGNOSIS — R4182 Altered mental status, unspecified: Secondary | ICD-10-CM | POA: Diagnosis not present

## 2021-11-25 DIAGNOSIS — R1312 Dysphagia, oropharyngeal phase: Secondary | ICD-10-CM | POA: Diagnosis not present

## 2021-11-25 DIAGNOSIS — R278 Other lack of coordination: Secondary | ICD-10-CM | POA: Diagnosis not present

## 2021-11-25 DIAGNOSIS — Z9181 History of falling: Secondary | ICD-10-CM | POA: Diagnosis not present

## 2021-11-25 DIAGNOSIS — R41841 Cognitive communication deficit: Secondary | ICD-10-CM | POA: Diagnosis not present

## 2021-11-25 DIAGNOSIS — R2689 Other abnormalities of gait and mobility: Secondary | ICD-10-CM | POA: Diagnosis not present

## 2021-11-25 DIAGNOSIS — G20 Parkinson's disease: Secondary | ICD-10-CM | POA: Diagnosis not present

## 2021-11-25 DIAGNOSIS — M6281 Muscle weakness (generalized): Secondary | ICD-10-CM | POA: Diagnosis not present

## 2021-11-26 DIAGNOSIS — R1312 Dysphagia, oropharyngeal phase: Secondary | ICD-10-CM | POA: Diagnosis not present

## 2021-11-26 DIAGNOSIS — R278 Other lack of coordination: Secondary | ICD-10-CM | POA: Diagnosis not present

## 2021-11-26 DIAGNOSIS — R41841 Cognitive communication deficit: Secondary | ICD-10-CM | POA: Diagnosis not present

## 2021-11-26 DIAGNOSIS — Z9181 History of falling: Secondary | ICD-10-CM | POA: Diagnosis not present

## 2021-11-26 DIAGNOSIS — M6281 Muscle weakness (generalized): Secondary | ICD-10-CM | POA: Diagnosis not present

## 2021-11-27 DIAGNOSIS — R41841 Cognitive communication deficit: Secondary | ICD-10-CM | POA: Diagnosis not present

## 2021-11-27 DIAGNOSIS — R1312 Dysphagia, oropharyngeal phase: Secondary | ICD-10-CM | POA: Diagnosis not present

## 2021-11-30 ENCOUNTER — Ambulatory Visit: Payer: Medicare Other | Admitting: Family Medicine

## 2021-11-30 DIAGNOSIS — R2689 Other abnormalities of gait and mobility: Secondary | ICD-10-CM | POA: Diagnosis not present

## 2021-11-30 DIAGNOSIS — G20 Parkinson's disease: Secondary | ICD-10-CM | POA: Diagnosis not present

## 2021-11-30 DIAGNOSIS — R1312 Dysphagia, oropharyngeal phase: Secondary | ICD-10-CM | POA: Diagnosis not present

## 2021-11-30 DIAGNOSIS — R41841 Cognitive communication deficit: Secondary | ICD-10-CM | POA: Diagnosis not present

## 2021-11-30 DIAGNOSIS — M6281 Muscle weakness (generalized): Secondary | ICD-10-CM | POA: Diagnosis not present

## 2021-11-30 DIAGNOSIS — Z9181 History of falling: Secondary | ICD-10-CM | POA: Diagnosis not present

## 2021-11-30 DIAGNOSIS — R278 Other lack of coordination: Secondary | ICD-10-CM | POA: Diagnosis not present

## 2021-12-01 ENCOUNTER — Ambulatory Visit (HOSPITAL_BASED_OUTPATIENT_CLINIC_OR_DEPARTMENT_OTHER)
Admission: RE | Admit: 2021-12-01 | Discharge: 2021-12-01 | Disposition: A | Discharge status: Home / Self Care | Payer: Medicare Other | Source: Ambulatory Visit | Attending: Family Medicine | Admitting: Family Medicine

## 2021-12-01 ENCOUNTER — Encounter: Payer: Self-pay | Admitting: Family Medicine

## 2021-12-01 ENCOUNTER — Ambulatory Visit (INDEPENDENT_AMBULATORY_CARE_PROVIDER_SITE_OTHER): Payer: Medicare Other | Admitting: Family Medicine

## 2021-12-01 VITALS — BP 110/70 | HR 58 | Temp 97.5°F | Resp 18 | Ht 62.0 in

## 2021-12-01 DIAGNOSIS — R052 Subacute cough: Secondary | ICD-10-CM | POA: Diagnosis not present

## 2021-12-01 DIAGNOSIS — R8290 Unspecified abnormal findings in urine: Secondary | ICD-10-CM

## 2021-12-01 DIAGNOSIS — F419 Anxiety disorder, unspecified: Secondary | ICD-10-CM

## 2021-12-01 DIAGNOSIS — G4700 Insomnia, unspecified: Secondary | ICD-10-CM

## 2021-12-01 DIAGNOSIS — R41841 Cognitive communication deficit: Secondary | ICD-10-CM | POA: Diagnosis not present

## 2021-12-01 DIAGNOSIS — R2689 Other abnormalities of gait and mobility: Secondary | ICD-10-CM | POA: Diagnosis not present

## 2021-12-01 DIAGNOSIS — W19XXXA Unspecified fall, initial encounter: Secondary | ICD-10-CM

## 2021-12-01 DIAGNOSIS — E785 Hyperlipidemia, unspecified: Secondary | ICD-10-CM

## 2021-12-01 DIAGNOSIS — Z9181 History of falling: Secondary | ICD-10-CM

## 2021-12-01 DIAGNOSIS — R4182 Altered mental status, unspecified: Secondary | ICD-10-CM | POA: Diagnosis not present

## 2021-12-01 DIAGNOSIS — R82998 Other abnormal findings in urine: Secondary | ICD-10-CM

## 2021-12-01 DIAGNOSIS — M6281 Muscle weakness (generalized): Secondary | ICD-10-CM | POA: Diagnosis not present

## 2021-12-01 DIAGNOSIS — R059 Cough, unspecified: Secondary | ICD-10-CM | POA: Diagnosis not present

## 2021-12-01 DIAGNOSIS — G20 Parkinson's disease: Secondary | ICD-10-CM | POA: Diagnosis not present

## 2021-12-01 DIAGNOSIS — R1312 Dysphagia, oropharyngeal phase: Secondary | ICD-10-CM | POA: Diagnosis not present

## 2021-12-01 LAB — CBC WITH DIFFERENTIAL/PLATELET
Basophils Absolute: 0 10*3/uL (ref 0.0–0.1)
Basophils Relative: 0.6 % (ref 0.0–3.0)
Eosinophils Absolute: 0.1 10*3/uL (ref 0.0–0.7)
Eosinophils Relative: 1.7 % (ref 0.0–5.0)
HCT: 32.7 % — ABNORMAL LOW (ref 36.0–46.0)
Hemoglobin: 10.9 g/dL — ABNORMAL LOW (ref 12.0–15.0)
Lymphocytes Relative: 19 % (ref 12.0–46.0)
Lymphs Abs: 0.9 10*3/uL (ref 0.7–4.0)
MCHC: 33.2 g/dL (ref 30.0–36.0)
MCV: 91.4 fl (ref 78.0–100.0)
Monocytes Absolute: 0.4 10*3/uL (ref 0.1–1.0)
Monocytes Relative: 8.8 % (ref 3.0–12.0)
Neutro Abs: 3.3 10*3/uL (ref 1.4–7.7)
Neutrophils Relative %: 69.9 % (ref 43.0–77.0)
Platelets: 280 10*3/uL (ref 150.0–400.0)
RBC: 3.58 Mil/uL — ABNORMAL LOW (ref 3.87–5.11)
RDW: 14.2 % (ref 11.5–15.5)
WBC: 4.7 10*3/uL (ref 4.0–10.5)

## 2021-12-01 LAB — POC URINALSYSI DIPSTICK (AUTOMATED)
Bilirubin, UA: NEGATIVE
Blood, UA: NEGATIVE
Glucose, UA: NEGATIVE
Nitrite, UA: NEGATIVE
Protein, UA: NEGATIVE
Spec Grav, UA: 1.015 (ref 1.010–1.025)
Urobilinogen, UA: 0.2 E.U./dL
pH, UA: 6 (ref 5.0–8.0)

## 2021-12-01 LAB — LIPID PANEL
Cholesterol: 178 mg/dL (ref 0–200)
HDL: 45.9 mg/dL (ref >39.00)
LDL Cholesterol: 113 mg/dL — ABNORMAL HIGH (ref 0–99)
NonHDL: 132.29
Total CHOL/HDL Ratio: 4
Triglycerides: 95 mg/dL (ref 0.0–149.0)
VLDL: 19 mg/dL (ref 0.0–40.0)

## 2021-12-01 LAB — COMPREHENSIVE METABOLIC PANEL
ALT: 7 U/L (ref 0–35)
AST: 11 U/L (ref 0–37)
Albumin: 3.8 g/dL (ref 3.5–5.2)
Alkaline Phosphatase: 176 U/L — ABNORMAL HIGH (ref 39–117)
BUN: 16 mg/dL (ref 6–23)
CO2: 33 mEq/L — ABNORMAL HIGH (ref 19–32)
Calcium: 9 mg/dL (ref 8.4–10.5)
Chloride: 102 mEq/L (ref 96–112)
Creatinine, Ser: 0.8 mg/dL (ref 0.40–1.20)
GFR: 68.24 mL/min (ref >60.00)
Glucose, Bld: 112 mg/dL — ABNORMAL HIGH (ref 70–99)
Potassium: 4.2 mEq/L (ref 3.5–5.1)
Sodium: 142 mEq/L (ref 135–145)
Total Bilirubin: 0.5 mg/dL (ref 0.2–1.2)
Total Protein: 6.1 g/dL (ref 6.0–8.3)

## 2021-12-01 LAB — TSH: TSH: 2.31 u[IU]/mL (ref 0.35–5.50)

## 2021-12-01 LAB — VITAMIN B12: Vitamin B-12: 820 pg/mL (ref 211–911)

## 2021-12-01 MED ORDER — ALPRAZOLAM 0.25 MG PO TABS: 0.2500 mg | ORAL_TABLET | Freq: Three times a day (TID) | ORAL | 0 refills | Status: DC | PRN

## 2021-12-01 NOTE — Progress Notes (Signed)
? ?Subjective:  ? ?By signing my name below, I, Elizabeth Mccullough, attest that this documentation has been prepared under the direction and in the presence of Dr. Roma Schanz, DO. 12/01/2021 ?   ? ? Patient ID: Elizabeth Mccullough, female    DOB: 12-26-1940, 81 y.o.   MRN: [id number] ? ?Chief Complaint  ?Patient presents with  ? Rehab  ?  Caregiver states pt is back at home.   ? ? ?HPI ?Patient is in today for a rehab follow up visit. She is present with her caregiver.  ? ?She fell and fractured her femur and was taken to rehab following her procedure. Her caregiver reports she got up from her bed at night and fell causing her the fracture. While in rehab she was wrongly administered insulin. Her caregiver reports after she witnessed it she stopped them from giving her more insulin. She notes that she does not know how much insulin they administered while at her stay. She also contracted Covid-19 while in rehab. Her caregiver reports her mental status has worsened since her stay at rehab.  ?Her caregiver reports she is deep cough and clears her throat often.  ?Her caregiver also reports there is a strong odor in her urine. She notes due to her decreased mental status, she will struggle to give a urine sample.  ?She gets anxious and "fidgety" in the afternoon. Her caregiver is requesting a refill on her xanax.  ?Her blood pressure and pulse are low during this visit. She does not check her blood pressure or pulse at home regularly. She continues taking 10 lisinopril daily, 40 mg lasix daily PO.  ?BP Readings from Last 3 Encounters:  ?12/01/21 110/70  ?10/06/21 139/68  ?05/18/21 (!) 91/46  ? ?Pulse Readings from Last 3 Encounters:  ?12/01/21 (!) 58  ?10/06/21 70  ?05/18/21 62  ? ? ?Past Medical History:  ?Diagnosis Date  ? AAA (abdominal aortic aneurysm) ([place])   ? Carotid artery disease ([place]) 01/12/2017  ? Carotid US 6/18: Bilat < 50%; repeat in 12/2000  ? Chronic combined systolic and diastolic CHF (congestive  heart failure) ([place]) 03/01/2017  ? NICM // [place] 8/18: normal coronary arteries /  [place] 8/18: EF 20-25, mod AI, mod MR, PASP 32 // [place] 1/19: EF 55-60, no RWMA, Gr 1 DD, mod AI, mild MR, PASP 32  ? Hyperlipidemia   ? Parkinson's disease ([place])   ? ? ?Past Surgical History:  ?Procedure Laterality Date  ? BREAST CYST ASPIRATION  1965  ? Right Breast  ? FEMUR IM NAIL Left 09/30/2021  ? Procedure: INTRAMEDULLARY (IM) NAIL FEMORAL;  Surgeon: Rod Can, MD;  Location: [facility];  Service: Orthopedics;  Laterality: Left;  ? HAMMER TOE SURGERY  04/10/02  ? Left Toe  ? KNEE SURGERY Right 10/17/15  ? meniscus repair  ? NASAL SEPTUM SURGERY  1980  ? RIGHT/LEFT HEART CATH AND CORONARY ANGIOGRAPHY N/A 03/03/2017  ? Procedure: RIGHT/LEFT HEART CATH AND CORONARY ANGIOGRAPHY;  Surgeon: Nelva Bush, MD;  Location: [place] [facility];  Service: Cardiovascular;  Laterality: N/A;  ? THORACIC AORTOGRAM N/A 03/03/2017  ? Procedure: Thoracic Aortogram;  Surgeon: Nelva Bush, MD;  Location: [place] [facility];  Service: Cardiovascular;  Laterality: N/A;  ? ? ?Family History  ?Problem Relation Age of Onset  ? Heart disease Mother   ? Hyperlipidemia Sister   ? Hyperlipidemia Brother   ? Diabetes Sister   ? Hyperlipidemia Sister   ? Cancer Sister 31  ?  colon  ? Cancer Sister   ?     breast  ? Stroke Brother   ? Hypertension Brother   ? Breast cancer Other   ? Colon cancer Other   ? Prostate cancer Son   ? Diabetes Daughter   ? Stomach cancer Daughter   ? Healthy Daughter   ? ? ?Social History  ? ?Socioeconomic History  ? Marital status: Widowed  ?  Spouse name: Not on file  ? Number of children: 4  ? Years of education: Not on file  ? Highest education level: 12th grade  ?Occupational History  ? Occupation: retired  ?Tobacco Use  ? Smoking status: Never  ? Smokeless tobacco: Never  ?Vaping Use  ? Vaping Use: Never used  ?Substance and Sexual Activity  ? Alcohol use: Yes  ?  Comment: social wine   ? Drug use: No  ? Sexual activity: Not  Currently  ?  Partners: Male  ?Other Topics Concern  ? Not on file  ?Social History Narrative  ? Exercise-- no  ? Pt lives with daughter [name] at home  ? ?Social Determinants of Health  ? ?Financial Resource Strain: Low Risk   ? Difficulty of Paying Living Expenses: Not hard at all  ?Food Insecurity: No Food Insecurity  ? Worried About Charity fundraiser in the Last Year: Never true  ? Ran Out of Food in the Last Year: Never true  ?Transportation Needs: No Transportation Needs  ? Lack of Transportation (Medical): No  ? Lack of Transportation (Non-Medical): No  ?Physical Activity: Not on file  ?Stress: Not on file  ?Social Connections: Not on file  ?Intimate Partner Violence: Not At Risk  ? Fear of Current or Ex-Partner: No  ? Emotionally Abused: No  ? Physically Abused: No  ? Sexually Abused: No  ? ? ?Outpatient Medications Prior to Visit  ?Medication Sig Dispense Refill  ? acetaminophen (TYLENOL) 325 MG tablet Take 2 tablets (650 mg total) by mouth every 6 (six) hours as needed for mild pain or headache.    ? Carbidopa-Levodopa ER (SINEMET CR) 25-100 MG tablet controlled release TAKE 2 TABLETS BY MOUTH 3 TIMES DAILY. (Patient taking differently: Take 2 tablets by mouth in the morning, at noon, and at bedtime.) 540 tablet 1  ? docusate sodium (COLACE) 100 MG capsule Take 1 capsule (100 mg total) by mouth 2 (two) times daily.    ? furosemide (LASIX) 40 MG tablet TAKE 1 TABLET BY MOUTH DAILY. (Patient taking differently: Take 40 mg by mouth daily.) 90 tablet 1  ? HYDROcodone-acetaminophen (NORCO/VICODIN) 5-325 MG tablet Take 1 tablet by mouth every 4 (four) hours as needed for severe pain. 30 tablet 0  ? lisinopril (ZESTRIL) 10 MG tablet TAKE 1 TABLET BY MOUTH DAILY. (Patient taking differently: Take 10 mg by mouth daily.) 90 tablet 1  ? lovastatin (MEVACOR) 20 MG tablet TAKE 1 TABLET (20 MG TOTAL) BY MOUTH AT BEDTIME. 90 tablet 0  ? polyethylene glycol (MIRALAX / GLYCOLAX) 17 g packet Take 17 g by mouth daily as  needed for mild constipation or moderate constipation.    ? senna (SENOKOT) 8.6 MG TABS tablet Take 1 tablet (8.6 mg total) by mouth 2 (two) times daily.    ? sertraline (ZOLOFT) 100 MG tablet TAKE 1 TABLET (100 MG TOTAL) BY MOUTH DAILY. 90 tablet 1  ? ALPRAZolam (XANAX) 0.25 MG tablet Take 1 tablet (0.25 mg total) by mouth 3 (three) times daily as needed for anxiety or  sleep. 10 tablet 0  ? bisoprolol (ZEBETA) 5 MG tablet Take 0.5 tablets (2.5 mg total) by mouth daily. 45 tablet 0  ? enoxaparin (LOVENOX) 40 MG/0.4ML injection Inject 0.4 mLs (40 mg total) into the skin daily for 25 days. Discontinue after 11/01/2021 dose which will complete 30 days postop DVT prophylaxis.    ? ?No facility-administered medications prior to visit.  ? ? ?Allergies  ?Allergen Reactions  ? Iohexol Rash  ?   Code: RASH, Desc: PATIENT STATES SHE IS ALLERGIC TO IV DYE. 20 YRS AGO SHE HAD A REACTION AT [facility], PT WAS GIVEN BENADRYL. 07/13/06/RM, Onset Date: [id number] ?  ? Penicillins Hives  ? ? ?Review of Systems  ?Constitutional:  Negative for fever and malaise/fatigue.  ?HENT:  Negative for congestion.   ?Eyes:  Negative for blurred vision.  ?Respiratory:  Positive for cough. Negative for shortness of breath.   ?Cardiovascular:  Negative for chest pain, palpitations and leg swelling.  ?Gastrointestinal:  Negative for abdominal pain, blood in stool and nausea.  ?Genitourinary:  Negative for dysuria and frequency.  ?     (+)strong urine odor  ?Musculoskeletal:  Negative for falls.  ?Skin:  Negative for rash.  ?Neurological:  Positive for weakness. Negative for dizziness, loss of consciousness and headaches.  ?Endo/Heme/Allergies:  Negative for environmental allergies.  ?Psychiatric/Behavioral:  Positive for memory loss. Negative for depression. The patient is not nervous/anxious.   ? ?   ?Objective:  ?  ?Physical Exam ?Vitals and nursing note reviewed.  ?Constitutional:   ?   General: She is not in acute distress. ?   Appearance:  Normal appearance. She is not ill-appearing.  ?HENT:  ?   Head: Normocephalic and atraumatic.  ?   Right Ear: External ear normal.  ?   Left Ear: External ear normal.  ?Eyes:  ?   Extraocular Movements: Extraocu

## 2021-12-01 NOTE — Patient Instructions (Signed)
Parkinson's Disease Parkinson's disease is a movement disorder. It is a long-term condition that gets worse over time. Each person with Parkinson's disease is affected differently. This condition limits a person's ability to control movements and move the body normally. The condition can range from mild to severe. Parkinson's disease tends to get worse slowly over several years. What are the causes? Parkinson's disease is caused by a loss of brain cells (neurons) that make a brain chemical called dopamine. Dopamine is needed to control movement. As the condition gets worse, more neurons that make dopamine die. This makes it hard to move or control your movements. The exact cause of the loss of neurons is not known. Genes and the environment may contribute to the cause of Parkinson's disease. What increases the risk? The following factors may make you more likely to develop this condition: Being female. Being age 60 or older. Having a family history of Parkinson's disease. Having had a traumatic brain injury. Having been exposed to toxins, such as pesticides. Having depression. What are the signs or symptoms? Symptoms of this condition can vary. The main symptoms are related to movement. These include: A tremor or shaking while you are resting. You cannot control the shaking. Stiffness in your arms and legs (rigidity). Slowing of movement. You may lose facial expressions and have trouble making small movements that are needed to button clothing or brush your teeth. An abnormal walk. You may walk with short, shuffling steps. Loss of balance and stability when standing. You may sway, fall backward, and have trouble making turns. Other symptoms include: Mental or cognitive changes, including: Depression or anxiety. Having false beliefs (delusions). Seeing, hearing, or feeling things that do not exist (hallucinations). Trouble speaking or swallowing. Changes in bowel or bladder functions,  including constipation, having to go urgently or frequently, or not being able to control your bowel or bladder. Changes in sleep habits, acting out dreams, or trouble sleeping. Depending on the severity of the symptoms, Parkinson's disease may be mild, moderate, or advanced. Parkinson's disease progression is different for everyone. Some people may not progress to the advanced stage. Mild Parkinson's disease involves: Movement problems that do not affect daily activities. Movement problems on one side of the body. Moderate Parkinson's disease involves: Movement problems on both sides of the body. Slowing of movement. Coordination and balance problems. Advanced Parkinson's disease involves: Extreme difficulty walking. Inability to live alone safely. Signs of dementia, such as having trouble remembering things, doing daily tasks such as getting dressed, and problem solving. How is this diagnosed? This condition is diagnosed by a specialist. A diagnosis may be made based on symptoms, your medical history, and a physical exam. You may also have brain imaging tests to check for loss of neurons in the brain. How is this treated? There is no cure for Parkinson's disease. Treatment focuses on managing your symptoms. Treatment may include: Medicines. Everyone responds to medicines differently. Your response may change over time. Work with your health care provider to find the best medicines for you. Speech, occupational, and physical therapy. Deep brain stimulation surgery to reduce tremors and other involuntary movements. Follow these instructions at home: Medicines Take over-the-counter and prescription medicines only as told by your health care provider. Avoid taking medicines that can affect thinking, such as pain or sleeping medicines. Eating and drinking Follow instructions from your health care provider about eating or drinking restrictions. Do not drink alcohol. Activity Ask your health  care provider if it is safe for you   to drive. Do exercises as told by your health care provider or physical therapist. Lifestyle  Install grab bars and railings in your home to prevent falls. Do not use any products that contain nicotine or tobacco. These products include cigarettes, chewing tobacco, and vaping devices, such as e-cigarettes. If you need help quitting, ask your health care provider. Consider joining a support group for people with Parkinson's disease. General instructions Work with your health care provider to know the kind of day-to-day help that you may need and what to do to stay safe. Keep all follow-up visits. This is important. Follow-up visits include any visits with a physical therapist, speech therapist, or occupational therapist. Where to find more information National Institute of Neurological Disorders and Stroke: www.ninds.nih.gov Parkinson's Foundation: www.parkinson.org Contact a health care provider if: Medicines do not help your symptoms. You are unsteady or have fallen at home. You need more support to function well at home. You have trouble swallowing. You have severe constipation. You are having problems with side effects from your medicines. You feel confused, anxious, depressed, or have hallucinations. Get help right away if you: Are injured after a fall. Cannot swallow without choking. Have chest pain or trouble breathing. Do not feel safe at home. Have thoughts about hurting yourself or others. These symptoms may represent a serious problem that is an emergency. Do not wait to see if the symptoms will go away. Get medical help right away. Call your local emergency services (911 in the [place] Do not drive yourself to the hospital. If you ever feel like you may hurt yourself or others, or have thoughts about taking your own life, get help right away. Go to your nearest emergency department or: Call your local emergency services (911 in the  [place] Call a suicide crisis helpline, such as the National Suicide Prevention Lifeline at 1-800-273-8255 or 988 in the [place] This is open 24 hours a day in the [place] Text the Crisis Text Line at 741741 (in the [place] Summary Parkinson's disease is a long-term condition that gets worse over time. This condition limits your ability to control your movements and move your body normally. There is no cure for Parkinson's disease. Treatment focuses on managing your symptoms. Work with your health care provider to know the kind of day-to-day help that you may need and what to do to stay safe. Keep all follow-up visits, including any visits with a physical therapist, speech therapist, or occupational therapist. This is important. This information is not intended to replace advice given to you by your health care provider. Make sure you discuss any questions you have with your health care provider. Document Revised: 01/28/2021 Document Reviewed: 10/20/2020 Elsevier Patient Education  2023 Elsevier Inc.  

## 2021-12-02 DIAGNOSIS — R278 Other lack of coordination: Secondary | ICD-10-CM | POA: Diagnosis not present

## 2021-12-02 DIAGNOSIS — M6281 Muscle weakness (generalized): Secondary | ICD-10-CM | POA: Diagnosis not present

## 2021-12-02 DIAGNOSIS — Z9181 History of falling: Secondary | ICD-10-CM | POA: Diagnosis not present

## 2021-12-03 ENCOUNTER — Other Ambulatory Visit: Payer: Self-pay | Admitting: Family Medicine

## 2021-12-03 DIAGNOSIS — Z9181 History of falling: Secondary | ICD-10-CM | POA: Diagnosis not present

## 2021-12-03 DIAGNOSIS — R1312 Dysphagia, oropharyngeal phase: Secondary | ICD-10-CM | POA: Diagnosis not present

## 2021-12-03 DIAGNOSIS — M6281 Muscle weakness (generalized): Secondary | ICD-10-CM | POA: Diagnosis not present

## 2021-12-03 DIAGNOSIS — R41841 Cognitive communication deficit: Secondary | ICD-10-CM | POA: Diagnosis not present

## 2021-12-03 DIAGNOSIS — R278 Other lack of coordination: Secondary | ICD-10-CM | POA: Diagnosis not present

## 2021-12-03 DIAGNOSIS — N390 Urinary tract infection, site not specified: Secondary | ICD-10-CM

## 2021-12-03 LAB — URINE CULTURE
MICRO NUMBER:: 13402263
SPECIMEN QUALITY:: ADEQUATE

## 2021-12-03 MED ORDER — CEPHALEXIN 500 MG PO CAPS: 500.0000 mg | ORAL_CAPSULE | Freq: Two times a day (BID) | ORAL | 0 refills | Status: DC

## 2021-12-04 DIAGNOSIS — M6281 Muscle weakness (generalized): Secondary | ICD-10-CM | POA: Diagnosis not present

## 2021-12-04 DIAGNOSIS — Z9181 History of falling: Secondary | ICD-10-CM | POA: Diagnosis not present

## 2021-12-04 DIAGNOSIS — R278 Other lack of coordination: Secondary | ICD-10-CM | POA: Diagnosis not present

## 2021-12-07 ENCOUNTER — Other Ambulatory Visit: Payer: Self-pay | Admitting: Family Medicine

## 2021-12-07 DIAGNOSIS — Z9181 History of falling: Secondary | ICD-10-CM | POA: Diagnosis not present

## 2021-12-07 DIAGNOSIS — R41841 Cognitive communication deficit: Secondary | ICD-10-CM | POA: Diagnosis not present

## 2021-12-07 DIAGNOSIS — R278 Other lack of coordination: Secondary | ICD-10-CM | POA: Diagnosis not present

## 2021-12-07 DIAGNOSIS — G20 Parkinson's disease: Secondary | ICD-10-CM | POA: Diagnosis not present

## 2021-12-07 DIAGNOSIS — R1312 Dysphagia, oropharyngeal phase: Secondary | ICD-10-CM | POA: Diagnosis not present

## 2021-12-07 DIAGNOSIS — R2689 Other abnormalities of gait and mobility: Secondary | ICD-10-CM | POA: Diagnosis not present

## 2021-12-07 DIAGNOSIS — M6281 Muscle weakness (generalized): Secondary | ICD-10-CM | POA: Diagnosis not present

## 2021-12-08 DIAGNOSIS — M6281 Muscle weakness (generalized): Secondary | ICD-10-CM | POA: Diagnosis not present

## 2021-12-08 DIAGNOSIS — R2689 Other abnormalities of gait and mobility: Secondary | ICD-10-CM | POA: Diagnosis not present

## 2021-12-08 DIAGNOSIS — Z9181 History of falling: Secondary | ICD-10-CM | POA: Diagnosis not present

## 2021-12-08 DIAGNOSIS — R278 Other lack of coordination: Secondary | ICD-10-CM | POA: Diagnosis not present

## 2021-12-08 DIAGNOSIS — G20 Parkinson's disease: Secondary | ICD-10-CM | POA: Diagnosis not present

## 2021-12-09 DIAGNOSIS — G20 Parkinson's disease: Secondary | ICD-10-CM | POA: Diagnosis not present

## 2021-12-09 DIAGNOSIS — R1312 Dysphagia, oropharyngeal phase: Secondary | ICD-10-CM | POA: Diagnosis not present

## 2021-12-09 DIAGNOSIS — M6281 Muscle weakness (generalized): Secondary | ICD-10-CM | POA: Diagnosis not present

## 2021-12-09 DIAGNOSIS — R41841 Cognitive communication deficit: Secondary | ICD-10-CM | POA: Diagnosis not present

## 2021-12-09 DIAGNOSIS — R2689 Other abnormalities of gait and mobility: Secondary | ICD-10-CM | POA: Diagnosis not present

## 2021-12-10 DIAGNOSIS — G20 Parkinson's disease: Secondary | ICD-10-CM | POA: Diagnosis not present

## 2021-12-10 DIAGNOSIS — R2689 Other abnormalities of gait and mobility: Secondary | ICD-10-CM | POA: Diagnosis not present

## 2021-12-10 DIAGNOSIS — M6281 Muscle weakness (generalized): Secondary | ICD-10-CM | POA: Diagnosis not present

## 2021-12-11 DIAGNOSIS — R41841 Cognitive communication deficit: Secondary | ICD-10-CM | POA: Diagnosis not present

## 2021-12-11 DIAGNOSIS — M6281 Muscle weakness (generalized): Secondary | ICD-10-CM | POA: Diagnosis not present

## 2021-12-11 DIAGNOSIS — R278 Other lack of coordination: Secondary | ICD-10-CM | POA: Diagnosis not present

## 2021-12-11 DIAGNOSIS — R1312 Dysphagia, oropharyngeal phase: Secondary | ICD-10-CM | POA: Diagnosis not present

## 2021-12-11 DIAGNOSIS — Z9181 History of falling: Secondary | ICD-10-CM | POA: Diagnosis not present

## 2021-12-15 DIAGNOSIS — R1312 Dysphagia, oropharyngeal phase: Secondary | ICD-10-CM | POA: Diagnosis not present

## 2021-12-15 DIAGNOSIS — R41841 Cognitive communication deficit: Secondary | ICD-10-CM | POA: Diagnosis not present

## 2021-12-16 ENCOUNTER — Encounter: Payer: Self-pay | Admitting: Family Medicine

## 2021-12-16 DIAGNOSIS — R41841 Cognitive communication deficit: Secondary | ICD-10-CM | POA: Diagnosis not present

## 2021-12-16 DIAGNOSIS — R2689 Other abnormalities of gait and mobility: Secondary | ICD-10-CM | POA: Diagnosis not present

## 2021-12-16 DIAGNOSIS — M6281 Muscle weakness (generalized): Secondary | ICD-10-CM | POA: Diagnosis not present

## 2021-12-16 DIAGNOSIS — G20 Parkinson's disease: Secondary | ICD-10-CM | POA: Diagnosis not present

## 2021-12-16 DIAGNOSIS — R1312 Dysphagia, oropharyngeal phase: Secondary | ICD-10-CM | POA: Diagnosis not present

## 2021-12-16 DIAGNOSIS — Z9181 History of falling: Secondary | ICD-10-CM | POA: Diagnosis not present

## 2021-12-16 DIAGNOSIS — R278 Other lack of coordination: Secondary | ICD-10-CM | POA: Diagnosis not present

## 2021-12-17 DIAGNOSIS — R2689 Other abnormalities of gait and mobility: Secondary | ICD-10-CM | POA: Diagnosis not present

## 2021-12-17 DIAGNOSIS — G20 Parkinson's disease: Secondary | ICD-10-CM | POA: Diagnosis not present

## 2021-12-17 DIAGNOSIS — Z9181 History of falling: Secondary | ICD-10-CM | POA: Diagnosis not present

## 2021-12-17 DIAGNOSIS — M6281 Muscle weakness (generalized): Secondary | ICD-10-CM | POA: Diagnosis not present

## 2021-12-17 DIAGNOSIS — R278 Other lack of coordination: Secondary | ICD-10-CM | POA: Diagnosis not present

## 2021-12-18 DIAGNOSIS — M6281 Muscle weakness (generalized): Secondary | ICD-10-CM | POA: Diagnosis not present

## 2021-12-18 DIAGNOSIS — R2689 Other abnormalities of gait and mobility: Secondary | ICD-10-CM | POA: Diagnosis not present

## 2021-12-18 DIAGNOSIS — R278 Other lack of coordination: Secondary | ICD-10-CM | POA: Diagnosis not present

## 2021-12-18 DIAGNOSIS — Z9181 History of falling: Secondary | ICD-10-CM | POA: Diagnosis not present

## 2021-12-18 DIAGNOSIS — G20 Parkinson's disease: Secondary | ICD-10-CM | POA: Diagnosis not present

## 2021-12-22 DIAGNOSIS — R2689 Other abnormalities of gait and mobility: Secondary | ICD-10-CM | POA: Diagnosis not present

## 2021-12-22 DIAGNOSIS — Z9181 History of falling: Secondary | ICD-10-CM | POA: Diagnosis not present

## 2021-12-22 DIAGNOSIS — M6281 Muscle weakness (generalized): Secondary | ICD-10-CM | POA: Diagnosis not present

## 2021-12-22 DIAGNOSIS — G20 Parkinson's disease: Secondary | ICD-10-CM | POA: Diagnosis not present

## 2021-12-22 DIAGNOSIS — R278 Other lack of coordination: Secondary | ICD-10-CM | POA: Diagnosis not present

## 2021-12-22 DIAGNOSIS — R41841 Cognitive communication deficit: Secondary | ICD-10-CM | POA: Diagnosis not present

## 2021-12-22 DIAGNOSIS — R1312 Dysphagia, oropharyngeal phase: Secondary | ICD-10-CM | POA: Diagnosis not present

## 2021-12-23 DIAGNOSIS — M6281 Muscle weakness (generalized): Secondary | ICD-10-CM | POA: Diagnosis not present

## 2021-12-23 DIAGNOSIS — R278 Other lack of coordination: Secondary | ICD-10-CM | POA: Diagnosis not present

## 2021-12-23 DIAGNOSIS — Z9181 History of falling: Secondary | ICD-10-CM | POA: Diagnosis not present

## 2021-12-24 DIAGNOSIS — R2689 Other abnormalities of gait and mobility: Secondary | ICD-10-CM | POA: Diagnosis not present

## 2021-12-24 DIAGNOSIS — R41841 Cognitive communication deficit: Secondary | ICD-10-CM | POA: Diagnosis not present

## 2021-12-24 DIAGNOSIS — M6281 Muscle weakness (generalized): Secondary | ICD-10-CM | POA: Diagnosis not present

## 2021-12-24 DIAGNOSIS — G20 Parkinson's disease: Secondary | ICD-10-CM | POA: Diagnosis not present

## 2021-12-24 DIAGNOSIS — R1312 Dysphagia, oropharyngeal phase: Secondary | ICD-10-CM | POA: Diagnosis not present

## 2021-12-25 DIAGNOSIS — R278 Other lack of coordination: Secondary | ICD-10-CM | POA: Diagnosis not present

## 2021-12-25 DIAGNOSIS — R2689 Other abnormalities of gait and mobility: Secondary | ICD-10-CM | POA: Diagnosis not present

## 2021-12-25 DIAGNOSIS — R41841 Cognitive communication deficit: Secondary | ICD-10-CM | POA: Diagnosis not present

## 2021-12-25 DIAGNOSIS — Z9181 History of falling: Secondary | ICD-10-CM | POA: Diagnosis not present

## 2021-12-25 DIAGNOSIS — M6281 Muscle weakness (generalized): Secondary | ICD-10-CM | POA: Diagnosis not present

## 2021-12-25 DIAGNOSIS — R1312 Dysphagia, oropharyngeal phase: Secondary | ICD-10-CM | POA: Diagnosis not present

## 2021-12-25 DIAGNOSIS — G20 Parkinson's disease: Secondary | ICD-10-CM | POA: Diagnosis not present

## 2021-12-28 DIAGNOSIS — G20 Parkinson's disease: Secondary | ICD-10-CM | POA: Diagnosis not present

## 2021-12-28 DIAGNOSIS — R2689 Other abnormalities of gait and mobility: Secondary | ICD-10-CM | POA: Diagnosis not present

## 2021-12-28 DIAGNOSIS — M6281 Muscle weakness (generalized): Secondary | ICD-10-CM | POA: Diagnosis not present

## 2021-12-29 DIAGNOSIS — Z9181 History of falling: Secondary | ICD-10-CM | POA: Diagnosis not present

## 2021-12-29 DIAGNOSIS — R1312 Dysphagia, oropharyngeal phase: Secondary | ICD-10-CM | POA: Diagnosis not present

## 2021-12-29 DIAGNOSIS — R41841 Cognitive communication deficit: Secondary | ICD-10-CM | POA: Diagnosis not present

## 2021-12-29 DIAGNOSIS — R2689 Other abnormalities of gait and mobility: Secondary | ICD-10-CM | POA: Diagnosis not present

## 2021-12-29 DIAGNOSIS — R278 Other lack of coordination: Secondary | ICD-10-CM | POA: Diagnosis not present

## 2021-12-29 DIAGNOSIS — M6281 Muscle weakness (generalized): Secondary | ICD-10-CM | POA: Diagnosis not present

## 2021-12-29 DIAGNOSIS — G20 Parkinson's disease: Secondary | ICD-10-CM | POA: Diagnosis not present

## 2021-12-30 DIAGNOSIS — R278 Other lack of coordination: Secondary | ICD-10-CM | POA: Diagnosis not present

## 2021-12-30 DIAGNOSIS — M6281 Muscle weakness (generalized): Secondary | ICD-10-CM | POA: Diagnosis not present

## 2021-12-30 DIAGNOSIS — R2689 Other abnormalities of gait and mobility: Secondary | ICD-10-CM | POA: Diagnosis not present

## 2021-12-30 DIAGNOSIS — G20 Parkinson's disease: Secondary | ICD-10-CM | POA: Diagnosis not present

## 2021-12-30 DIAGNOSIS — Z9181 History of falling: Secondary | ICD-10-CM | POA: Diagnosis not present

## 2021-12-31 DIAGNOSIS — M6281 Muscle weakness (generalized): Secondary | ICD-10-CM | POA: Diagnosis not present

## 2021-12-31 DIAGNOSIS — R1312 Dysphagia, oropharyngeal phase: Secondary | ICD-10-CM | POA: Diagnosis not present

## 2021-12-31 DIAGNOSIS — R41841 Cognitive communication deficit: Secondary | ICD-10-CM | POA: Diagnosis not present

## 2021-12-31 DIAGNOSIS — G20 Parkinson's disease: Secondary | ICD-10-CM | POA: Diagnosis not present

## 2021-12-31 DIAGNOSIS — R2689 Other abnormalities of gait and mobility: Secondary | ICD-10-CM | POA: Diagnosis not present

## 2022-01-01 DIAGNOSIS — R41841 Cognitive communication deficit: Secondary | ICD-10-CM | POA: Diagnosis not present

## 2022-01-01 DIAGNOSIS — Z9181 History of falling: Secondary | ICD-10-CM | POA: Diagnosis not present

## 2022-01-01 DIAGNOSIS — R1312 Dysphagia, oropharyngeal phase: Secondary | ICD-10-CM | POA: Diagnosis not present

## 2022-01-01 DIAGNOSIS — M6281 Muscle weakness (generalized): Secondary | ICD-10-CM | POA: Diagnosis not present

## 2022-01-01 DIAGNOSIS — R278 Other lack of coordination: Secondary | ICD-10-CM | POA: Diagnosis not present

## 2022-01-05 DIAGNOSIS — Z9181 History of falling: Secondary | ICD-10-CM | POA: Diagnosis not present

## 2022-01-05 DIAGNOSIS — R278 Other lack of coordination: Secondary | ICD-10-CM | POA: Diagnosis not present

## 2022-01-05 DIAGNOSIS — G20 Parkinson's disease: Secondary | ICD-10-CM | POA: Diagnosis not present

## 2022-01-05 DIAGNOSIS — M6281 Muscle weakness (generalized): Secondary | ICD-10-CM | POA: Diagnosis not present

## 2022-01-05 DIAGNOSIS — R2689 Other abnormalities of gait and mobility: Secondary | ICD-10-CM | POA: Diagnosis not present

## 2022-01-06 DIAGNOSIS — R41841 Cognitive communication deficit: Secondary | ICD-10-CM | POA: Diagnosis not present

## 2022-01-06 DIAGNOSIS — R1312 Dysphagia, oropharyngeal phase: Secondary | ICD-10-CM | POA: Diagnosis not present

## 2022-01-07 ENCOUNTER — Other Ambulatory Visit: Payer: Self-pay | Admitting: Family Medicine

## 2022-01-07 DIAGNOSIS — G4700 Insomnia, unspecified: Secondary | ICD-10-CM

## 2022-01-07 DIAGNOSIS — M6281 Muscle weakness (generalized): Secondary | ICD-10-CM | POA: Diagnosis not present

## 2022-01-07 DIAGNOSIS — I10 Essential (primary) hypertension: Secondary | ICD-10-CM

## 2022-01-07 DIAGNOSIS — R278 Other lack of coordination: Secondary | ICD-10-CM | POA: Diagnosis not present

## 2022-01-07 DIAGNOSIS — G20 Parkinson's disease: Secondary | ICD-10-CM | POA: Diagnosis not present

## 2022-01-07 DIAGNOSIS — Z9181 History of falling: Secondary | ICD-10-CM | POA: Diagnosis not present

## 2022-01-07 DIAGNOSIS — R2689 Other abnormalities of gait and mobility: Secondary | ICD-10-CM | POA: Diagnosis not present

## 2022-01-07 DIAGNOSIS — F419 Anxiety disorder, unspecified: Secondary | ICD-10-CM

## 2022-01-07 NOTE — Telephone Encounter (Signed)
Requesting: alprazolam 0.'25mg'$   Contract:07/26/2017 UDS: 07/26/2017 Last Visit: 12/01/21 Next Visit: None Last Refill: 12/01/21 #10 and 0RF  Please Advise

## 2022-01-08 ENCOUNTER — Other Ambulatory Visit: Payer: Self-pay | Admitting: Family Medicine

## 2022-01-08 DIAGNOSIS — M6281 Muscle weakness (generalized): Secondary | ICD-10-CM | POA: Diagnosis not present

## 2022-01-08 DIAGNOSIS — R278 Other lack of coordination: Secondary | ICD-10-CM | POA: Diagnosis not present

## 2022-01-08 DIAGNOSIS — Z9181 History of falling: Secondary | ICD-10-CM | POA: Diagnosis not present

## 2022-01-08 DIAGNOSIS — G20 Parkinson's disease: Secondary | ICD-10-CM | POA: Diagnosis not present

## 2022-01-08 DIAGNOSIS — R1312 Dysphagia, oropharyngeal phase: Secondary | ICD-10-CM | POA: Diagnosis not present

## 2022-01-08 DIAGNOSIS — F419 Anxiety disorder, unspecified: Secondary | ICD-10-CM

## 2022-01-08 DIAGNOSIS — G4700 Insomnia, unspecified: Secondary | ICD-10-CM

## 2022-01-08 DIAGNOSIS — R2689 Other abnormalities of gait and mobility: Secondary | ICD-10-CM | POA: Diagnosis not present

## 2022-01-08 DIAGNOSIS — R41841 Cognitive communication deficit: Secondary | ICD-10-CM | POA: Diagnosis not present

## 2022-01-11 DIAGNOSIS — R278 Other lack of coordination: Secondary | ICD-10-CM | POA: Diagnosis not present

## 2022-01-11 DIAGNOSIS — Z9181 History of falling: Secondary | ICD-10-CM | POA: Diagnosis not present

## 2022-01-11 DIAGNOSIS — R41841 Cognitive communication deficit: Secondary | ICD-10-CM | POA: Diagnosis not present

## 2022-01-11 DIAGNOSIS — R1312 Dysphagia, oropharyngeal phase: Secondary | ICD-10-CM | POA: Diagnosis not present

## 2022-01-11 DIAGNOSIS — M6281 Muscle weakness (generalized): Secondary | ICD-10-CM | POA: Diagnosis not present

## 2022-01-11 NOTE — Telephone Encounter (Signed)
Requesting: alprazolam 0.25mg   Contract:07/26/2017 UDS: 07/26/2017 Last Visit: 12/01/21 Next Visit: None Last Refill: 01/07/2022 #10 and 0RF  Please Advise

## 2022-01-12 DIAGNOSIS — R41841 Cognitive communication deficit: Secondary | ICD-10-CM | POA: Diagnosis not present

## 2022-01-12 DIAGNOSIS — R1312 Dysphagia, oropharyngeal phase: Secondary | ICD-10-CM | POA: Diagnosis not present

## 2022-01-12 DIAGNOSIS — S72142D Displaced intertrochanteric fracture of left femur, subsequent encounter for closed fracture with routine healing: Secondary | ICD-10-CM | POA: Diagnosis not present

## 2022-01-13 DIAGNOSIS — M6281 Muscle weakness (generalized): Secondary | ICD-10-CM | POA: Diagnosis not present

## 2022-01-13 DIAGNOSIS — R2689 Other abnormalities of gait and mobility: Secondary | ICD-10-CM | POA: Diagnosis not present

## 2022-01-13 DIAGNOSIS — G20 Parkinson's disease: Secondary | ICD-10-CM | POA: Diagnosis not present

## 2022-01-14 ENCOUNTER — Telehealth: Payer: Self-pay | Admitting: Family Medicine

## 2022-01-14 DIAGNOSIS — M6281 Muscle weakness (generalized): Secondary | ICD-10-CM | POA: Diagnosis not present

## 2022-01-14 DIAGNOSIS — G20 Parkinson's disease: Secondary | ICD-10-CM | POA: Diagnosis not present

## 2022-01-14 DIAGNOSIS — R41841 Cognitive communication deficit: Secondary | ICD-10-CM | POA: Diagnosis not present

## 2022-01-14 DIAGNOSIS — R2689 Other abnormalities of gait and mobility: Secondary | ICD-10-CM | POA: Diagnosis not present

## 2022-01-14 DIAGNOSIS — R1312 Dysphagia, oropharyngeal phase: Secondary | ICD-10-CM | POA: Diagnosis not present

## 2022-01-14 NOTE — Telephone Encounter (Signed)
Patient's daughter wanted to leave a note for Dr. Etter Sjogren, wanting her to increase her xanax to 90 a month. Please advise.

## 2022-01-15 ENCOUNTER — Other Ambulatory Visit: Payer: Self-pay | Admitting: Family Medicine

## 2022-01-15 DIAGNOSIS — Z9181 History of falling: Secondary | ICD-10-CM | POA: Diagnosis not present

## 2022-01-15 DIAGNOSIS — M6281 Muscle weakness (generalized): Secondary | ICD-10-CM | POA: Diagnosis not present

## 2022-01-15 DIAGNOSIS — G4700 Insomnia, unspecified: Secondary | ICD-10-CM

## 2022-01-15 DIAGNOSIS — R278 Other lack of coordination: Secondary | ICD-10-CM | POA: Diagnosis not present

## 2022-01-15 DIAGNOSIS — F419 Anxiety disorder, unspecified: Secondary | ICD-10-CM

## 2022-01-15 DIAGNOSIS — R2689 Other abnormalities of gait and mobility: Secondary | ICD-10-CM | POA: Diagnosis not present

## 2022-01-15 DIAGNOSIS — G20 Parkinson's disease: Secondary | ICD-10-CM | POA: Diagnosis not present

## 2022-01-15 MED ORDER — ALPRAZOLAM 0.25 MG PO TABS: 0.2500 mg | ORAL_TABLET | Freq: Three times a day (TID) | ORAL | 1 refills | Status: DC | PRN

## 2022-01-18 DIAGNOSIS — R278 Other lack of coordination: Secondary | ICD-10-CM | POA: Diagnosis not present

## 2022-01-18 DIAGNOSIS — R2689 Other abnormalities of gait and mobility: Secondary | ICD-10-CM | POA: Diagnosis not present

## 2022-01-18 DIAGNOSIS — Z9181 History of falling: Secondary | ICD-10-CM | POA: Diagnosis not present

## 2022-01-18 DIAGNOSIS — R41841 Cognitive communication deficit: Secondary | ICD-10-CM | POA: Diagnosis not present

## 2022-01-18 DIAGNOSIS — M6281 Muscle weakness (generalized): Secondary | ICD-10-CM | POA: Diagnosis not present

## 2022-01-18 DIAGNOSIS — R1312 Dysphagia, oropharyngeal phase: Secondary | ICD-10-CM | POA: Diagnosis not present

## 2022-01-18 DIAGNOSIS — G20 Parkinson's disease: Secondary | ICD-10-CM | POA: Diagnosis not present

## 2022-01-18 NOTE — Telephone Encounter (Signed)
Pt's daughter made aware.

## 2022-01-20 DIAGNOSIS — R41841 Cognitive communication deficit: Secondary | ICD-10-CM | POA: Diagnosis not present

## 2022-01-20 DIAGNOSIS — R2689 Other abnormalities of gait and mobility: Secondary | ICD-10-CM | POA: Diagnosis not present

## 2022-01-20 DIAGNOSIS — M6281 Muscle weakness (generalized): Secondary | ICD-10-CM | POA: Diagnosis not present

## 2022-01-20 DIAGNOSIS — G20 Parkinson's disease: Secondary | ICD-10-CM | POA: Diagnosis not present

## 2022-01-20 DIAGNOSIS — R1312 Dysphagia, oropharyngeal phase: Secondary | ICD-10-CM | POA: Diagnosis not present

## 2022-01-21 DIAGNOSIS — R1312 Dysphagia, oropharyngeal phase: Secondary | ICD-10-CM | POA: Diagnosis not present

## 2022-01-21 DIAGNOSIS — R41841 Cognitive communication deficit: Secondary | ICD-10-CM | POA: Diagnosis not present

## 2022-01-22 DIAGNOSIS — G20 Parkinson's disease: Secondary | ICD-10-CM | POA: Diagnosis not present

## 2022-01-22 DIAGNOSIS — M6281 Muscle weakness (generalized): Secondary | ICD-10-CM | POA: Diagnosis not present

## 2022-01-22 DIAGNOSIS — R2689 Other abnormalities of gait and mobility: Secondary | ICD-10-CM | POA: Diagnosis not present

## 2022-01-23 DIAGNOSIS — R4182 Altered mental status, unspecified: Secondary | ICD-10-CM | POA: Diagnosis not present

## 2022-01-23 DIAGNOSIS — S72002D Fracture of unspecified part of neck of left femur, subsequent encounter for closed fracture with routine healing: Secondary | ICD-10-CM | POA: Diagnosis not present

## 2022-01-23 DIAGNOSIS — I5032 Chronic diastolic (congestive) heart failure: Secondary | ICD-10-CM | POA: Diagnosis not present

## 2022-01-23 DIAGNOSIS — N170 Acute kidney failure with tubular necrosis: Secondary | ICD-10-CM | POA: Diagnosis not present

## 2022-01-24 ENCOUNTER — Encounter: Payer: Self-pay | Admitting: Family Medicine

## 2022-01-25 ENCOUNTER — Encounter: Payer: Medicare Other | Admitting: Nurse Practitioner

## 2022-01-25 ENCOUNTER — Telehealth: Payer: Self-pay | Admitting: *Deleted

## 2022-01-25 DIAGNOSIS — R41841 Cognitive communication deficit: Secondary | ICD-10-CM | POA: Diagnosis not present

## 2022-01-25 DIAGNOSIS — R1312 Dysphagia, oropharyngeal phase: Secondary | ICD-10-CM | POA: Diagnosis not present

## 2022-01-25 MED ORDER — CIPROFLOXACIN HCL 250 MG PO TABS: 250.0000 mg | ORAL_TABLET | Freq: Two times a day (BID) | ORAL | 0 refills | Status: AC

## 2022-01-25 NOTE — Telephone Encounter (Signed)
Spoke with patients daughter and she stated she would like the medication and she does not need and appointment because pt gets these about every 3 weeks.  Advised daughter that our policy is an office visit or virtual visit for urinary issues.  Offered visit with another practice but she declined.  Advised that she could do a E-visit through [facility], which she agreed.  Patient needs to be seen today.

## 2022-01-25 NOTE — Telephone Encounter (Signed)
Pt's daughter stated they never received a call from the on call provider e-vist though mychart. She is very frustrated and would like to know if anything can be sent in. [phone] is [facility] number.

## 2022-01-25 NOTE — Telephone Encounter (Signed)
Who Is Calling Patient / Member / Family / Caregiver Call Type Triage / Clinical Caller Name [name] Relationship To Patient Daughter Return Phone Number [phone] (Primary) Chief Complaint CONFUSION - new onset Reason for Call Symptomatic / Request for Health Information Initial Comment Caller states her mother has an UTI. Her mental status has changed. It showed positive on a home test. She gets them about every 3 weeks . She had a bowel movement in her pants today . She also has a strong odor to her urine Translation No Nurse Assessment Nurse: Cristino Martes, RN, Joelene Millin Date/Time (Eastern Time): 01/24/2022 2:42:36 PM Confirm and document reason for call. If symptomatic, describe symptoms. ---Caller states her mother has an UTI. Her mental status h- confused and can't ta;l clearly. It showed positive on a home test today. She gets them about every 3 weeks, strong odor to her urine, frequency. No fever or chills, no vomiting or nausea. Maintaining hydration.  Comments User: Jim Desanctis, RN Date/Time Eilene Ghazi Time): 01/24/2022 2:51:31 PM Daughter is a Marine scientist and just wanted medication sent in. Unable to finish the assessment.

## 2022-01-25 NOTE — Progress Notes (Signed)
Elizabeth Mccullough,  Due to your history of recurrent UTIs it is recommended that you have a urine culture performed with each onset of symptoms so we can determine if you have any resistance to antibiotics. We are unable to perform lab work at this time so we would prefer you are seen at your primary care office or urgent care today for best care.     NOTE: There will be NO CHARGE for this eVisit   If you are having a true medical emergency please call 911.      For an urgent face to face visit, [facility] has seven urgent care centers for your convenience:     [facility] [place] [place] [facility] [place] Get Driving Directions [phone] [address] [place], [place] [address]    [facility]) Get Driving Directions [phone] [place], [place] [address]  [facility] [place] ([place]) Get Driving Directions [phone] [address] [place],  [place]  [address]  [facility] - at [address] Get Driving Directions  [phone] [address] [place],  [place] [address]   [facility] Get Driving Directions [phone] [address] [place], [place] [address]   [facility] Get Driving Directions  [phone] [address] [place], [place] [address]   [facility] [place] Get Driving Directions [phone] [address], [place], [place] [address]  Your MyChart E-visit questionnaire answers were reviewed by a board certified advanced clinical practitioner to complete your personal care plan based on your specific symptoms.  Thank you for using e-Visits.

## 2022-01-26 DIAGNOSIS — R1312 Dysphagia, oropharyngeal phase: Secondary | ICD-10-CM | POA: Diagnosis not present

## 2022-01-26 DIAGNOSIS — M6281 Muscle weakness (generalized): Secondary | ICD-10-CM | POA: Diagnosis not present

## 2022-01-26 DIAGNOSIS — R2689 Other abnormalities of gait and mobility: Secondary | ICD-10-CM | POA: Diagnosis not present

## 2022-01-26 DIAGNOSIS — R41841 Cognitive communication deficit: Secondary | ICD-10-CM | POA: Diagnosis not present

## 2022-01-26 DIAGNOSIS — G20 Parkinson's disease: Secondary | ICD-10-CM | POA: Diagnosis not present

## 2022-01-26 IMAGING — CT CT CERVICAL SPINE W/O CM
3 series · 13 of 27 positions shown, 16 images · non-contrast
Comparison: Head CT 11/25/2019.

CLINICAL DATA: Head trauma, minor. Neck trauma. Additional history
provided: Fall.

EXAM:
CT HEAD WITHOUT CONTRAST
CT CERVICAL SPINE WITHOUT CONTRAST
TECHNIQUE: Multidetector CT imaging of the head and cervical spine was
performed following the standard protocol without intravenous
contrast. Multiplanar CT image reconstructions of the cervical spine
were also generated.

[Series 2: c_spine 2.0 br62 3 · axial · 0.45mm/px · z∈[-288,-164]mm · 5 of 94 slices shown, 7 images]
[im 16/94  soft-tissue]
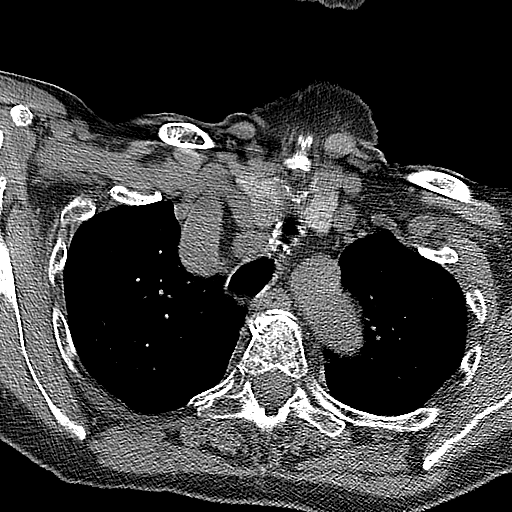
[im 16/94  bone]
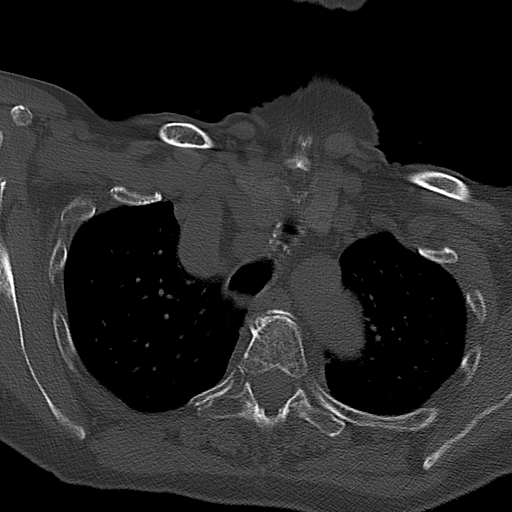
[im 32/94  bone]
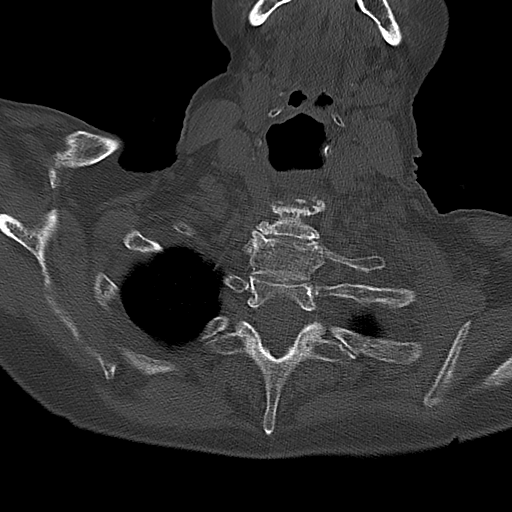
[im 47/94  bone]
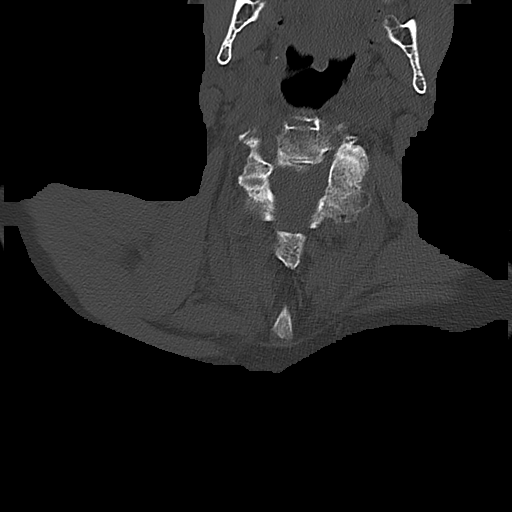
[im 63/94  bone]
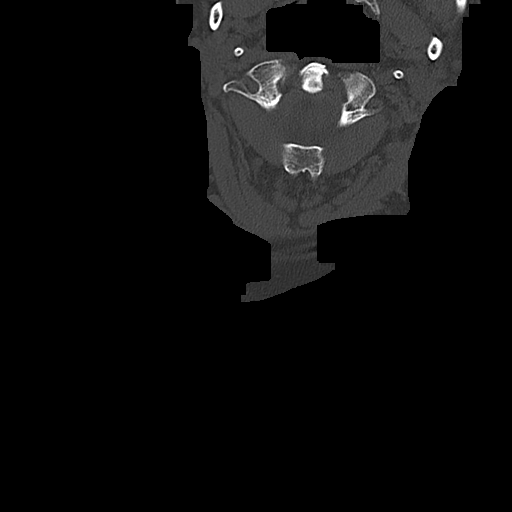
[im 78/94  soft-tissue]
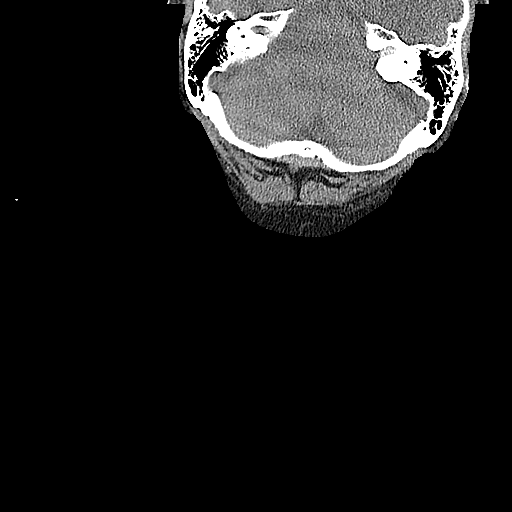
[im 78/94  bone]
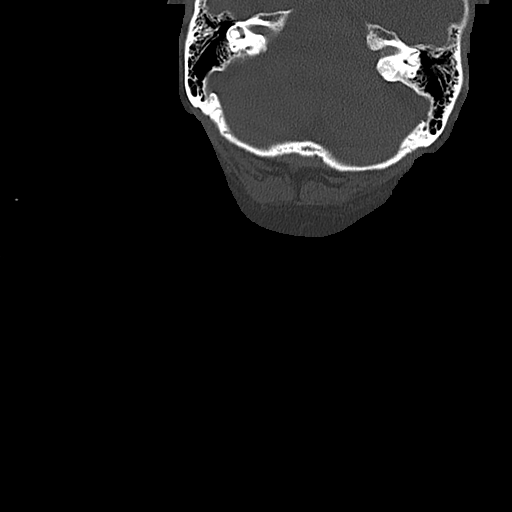

[Series 3: c_spine 2.0 br38 3 · axial · 0.45mm/px · z∈[-288,-226]mm · 3 of 94 slices shown]
[im 16/94  bone]
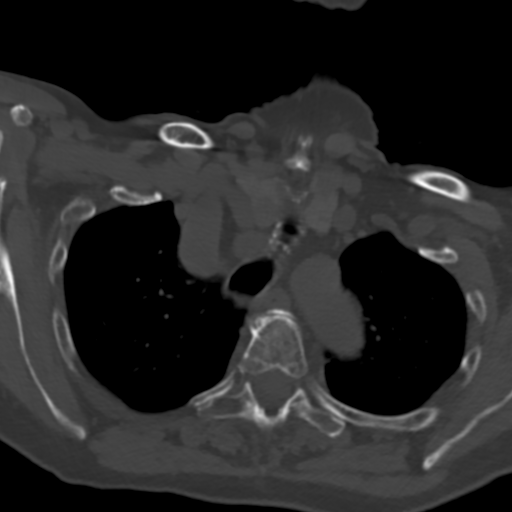
[im 32/94  bone]
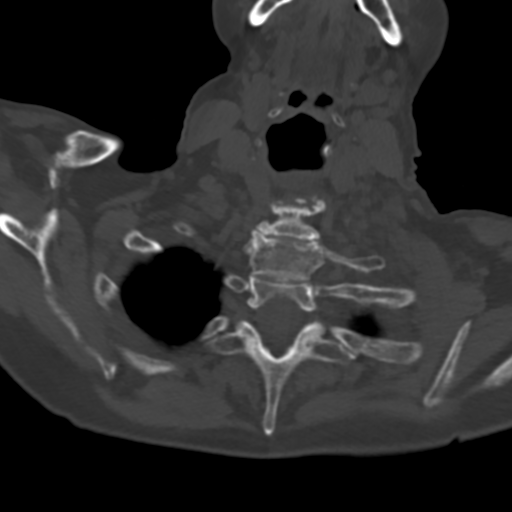
[im 47/94  bone]
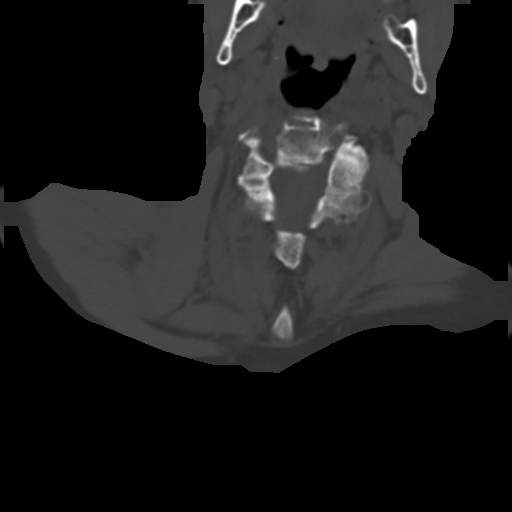

[Series 6: sagittals · sagittal · 0.38mm/px · 5 of 61 slices shown, 6 images]
[im 21/61  bone]
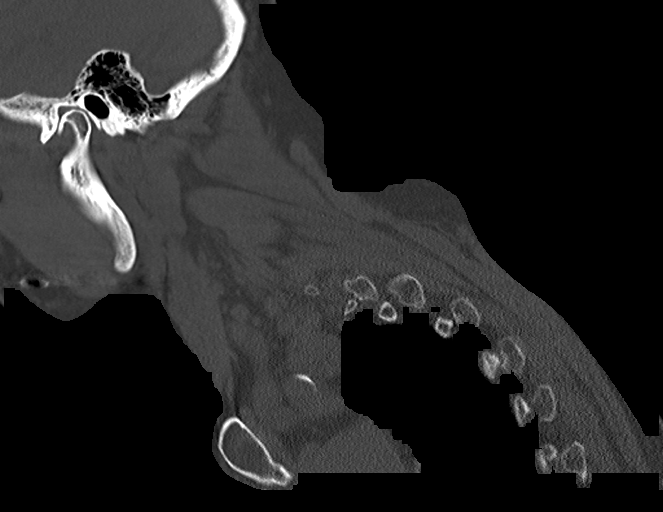
[im 26/61  bone]
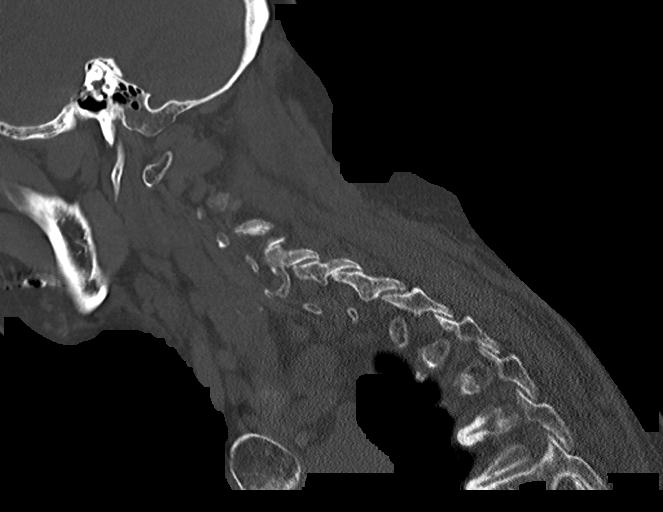
[im 31/61  soft-tissue]
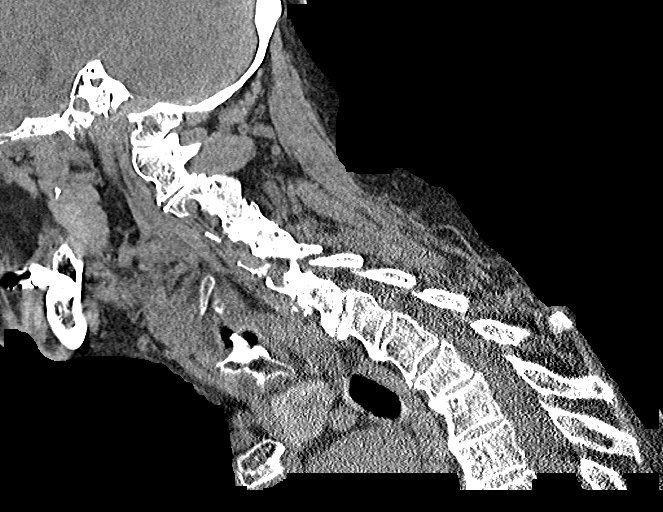
[im 31/61  bone]
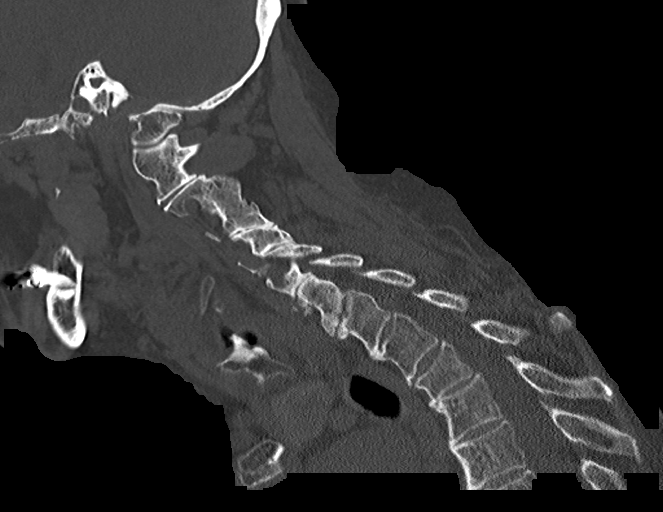
[im 36/61  bone]
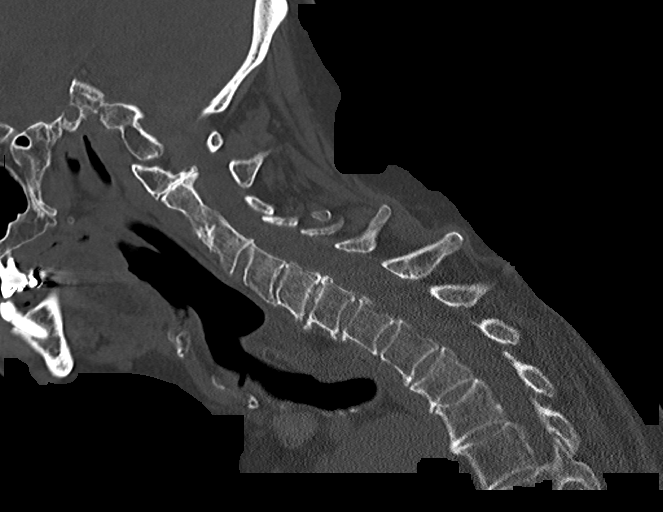
[im 41/61  bone]
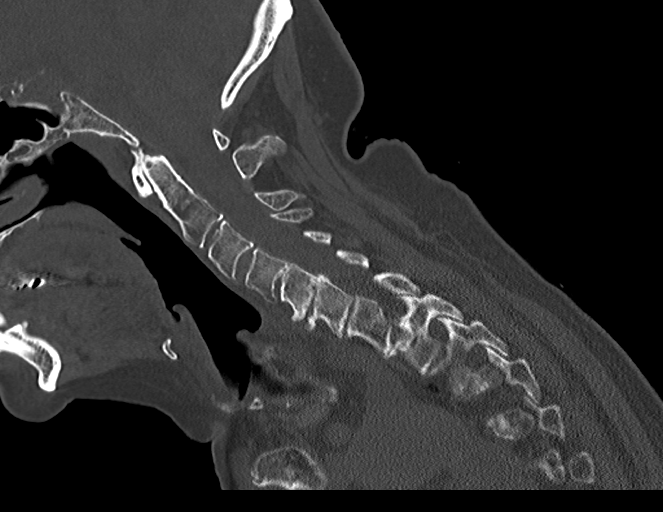

[13 of 27 positions shown; findings below may reference images not displayed]

Radiographs of the cervical spine
07/16/2019. CT of the cervical spine 04/28/2018.
FINDINGS: CT HEAD FINDINGS

Brain:

Generalized cerebral atrophy. Ill-defined hypoattenuation within the
cerebral white matter which is nonspecific, but consistent with
chronic small vessel ischemic disease. These findings are unchanged
as compared to the head CT of 11/25/2019.

There is no acute intracranial hemorrhage.

No demarcated cortical infarct.

No extra-axial fluid collection.

No evidence of intracranial mass.

No midline shift.

Vascular: No hyperdense vessel.  Atherosclerotic calcifications.

Skull: Normal. Negative for fracture or focal lesion.

Sinuses/Orbits: Visualized orbits show no acute finding. Frontal
scalp/forehead soft tissue swelling/hematoma. Minimal ethmoid sinus
mucosal thickening at the imaged levels. No significant mastoid
effusion.

CT CERVICAL SPINE FINDINGS

Alignment: Straightening of the expected cervical lordosis. No
significant spondylolisthesis.

Skull base and vertebrae: The basion-dental and atlanto-dental
intervals are maintained.No evidence of acute fracture to the
cervical spine. Redemonstrated mild chronic T2 vertebral body
compression fracture and mild chronic T3 superior endplate
deformity.

Soft tissues and spinal canal: No prevertebral fluid or swelling. No
visible canal hematoma.

Disc levels: Cervical spondylosis with multilevel disc space
narrowing, disc bulges, posterior disc osteophytes, uncovertebral
hypertrophy and facet arthrosis. Disc space narrowing is severe at
C4-C5 and C5-C6. No high-grade bony spinal canal stenosis.
Multilevel bony neural foraminal narrowing greatest bilaterally at
C4-C5 and C5-C6. There is fusion across the C2-C3 posterior disc
space and bilateral facet joints. There is also fusion across the
C4-C5 facet joints.

Upper chest: No consolidation within the imaged lung apices. No
visible pneumothorax.

Other: 16 mm nodule within the right thyroid lobe. Non acute,
nondisplaced fracture deformity of the posterior right fourth rib
(series 7, image 90).
IMPRESSION: CT head:

1. No evidence of acute intracranial abnormality.
2. Frontal scalp and forehead soft tissue swelling/hematoma.
3. Generalized cerebral atrophy and chronic small vessel ischemic
disease, stable as compared to the head CT of 11/25/2019.
[DATE]. Minimal ethmoid sinus mucosal thickening.

CT cervical spine:

1. No evidence of acute fracture to the cervical spine.
2. Cervical spondylosis as described.
3. Redemonstrated mild chronic T2 vertebral body compression
fracture and mild chronic T3 superior endplate deformity.
4. Non-acute, nondisplaced fracture of the posterior right fourth
rib.
5. 16 mm right thyroid lobe nodule. Nonemergent thyroid ultrasound
is recommended for further evaluation.

## 2022-01-26 IMAGING — DX DG HIP (WITH OR WITHOUT PELVIS) 2-3V*R*
4 series · 4 of 4 positions shown · non-contrast
Comparison: None.

CLINICAL DATA: Fall

EXAM:
DG HIP (WITH OR WITHOUT PELVIS) 2-3V RIGHT

[pelvis ap]
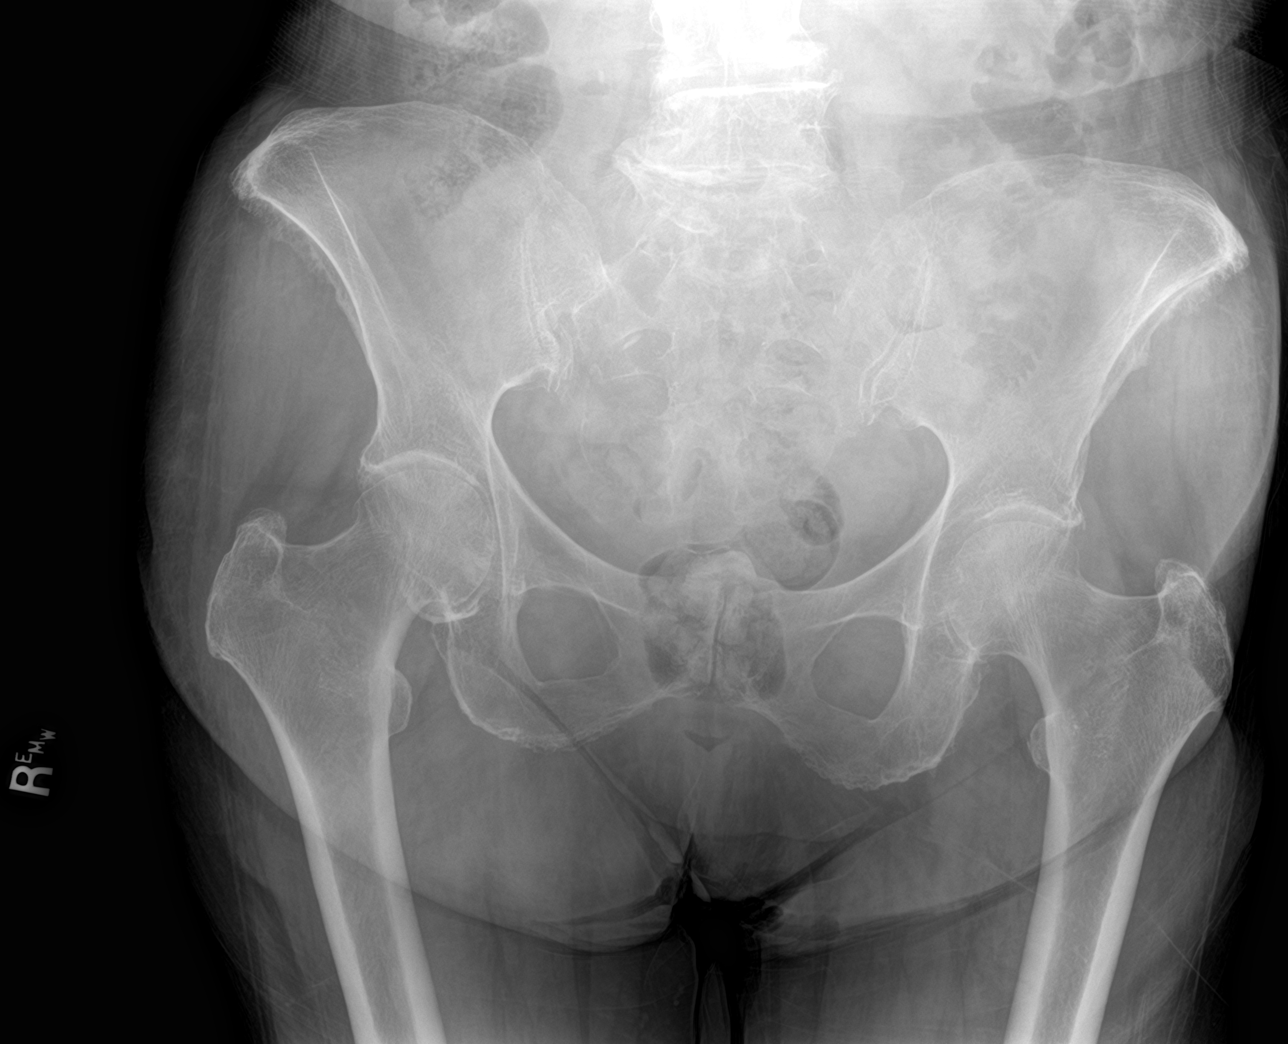

[hip ap]
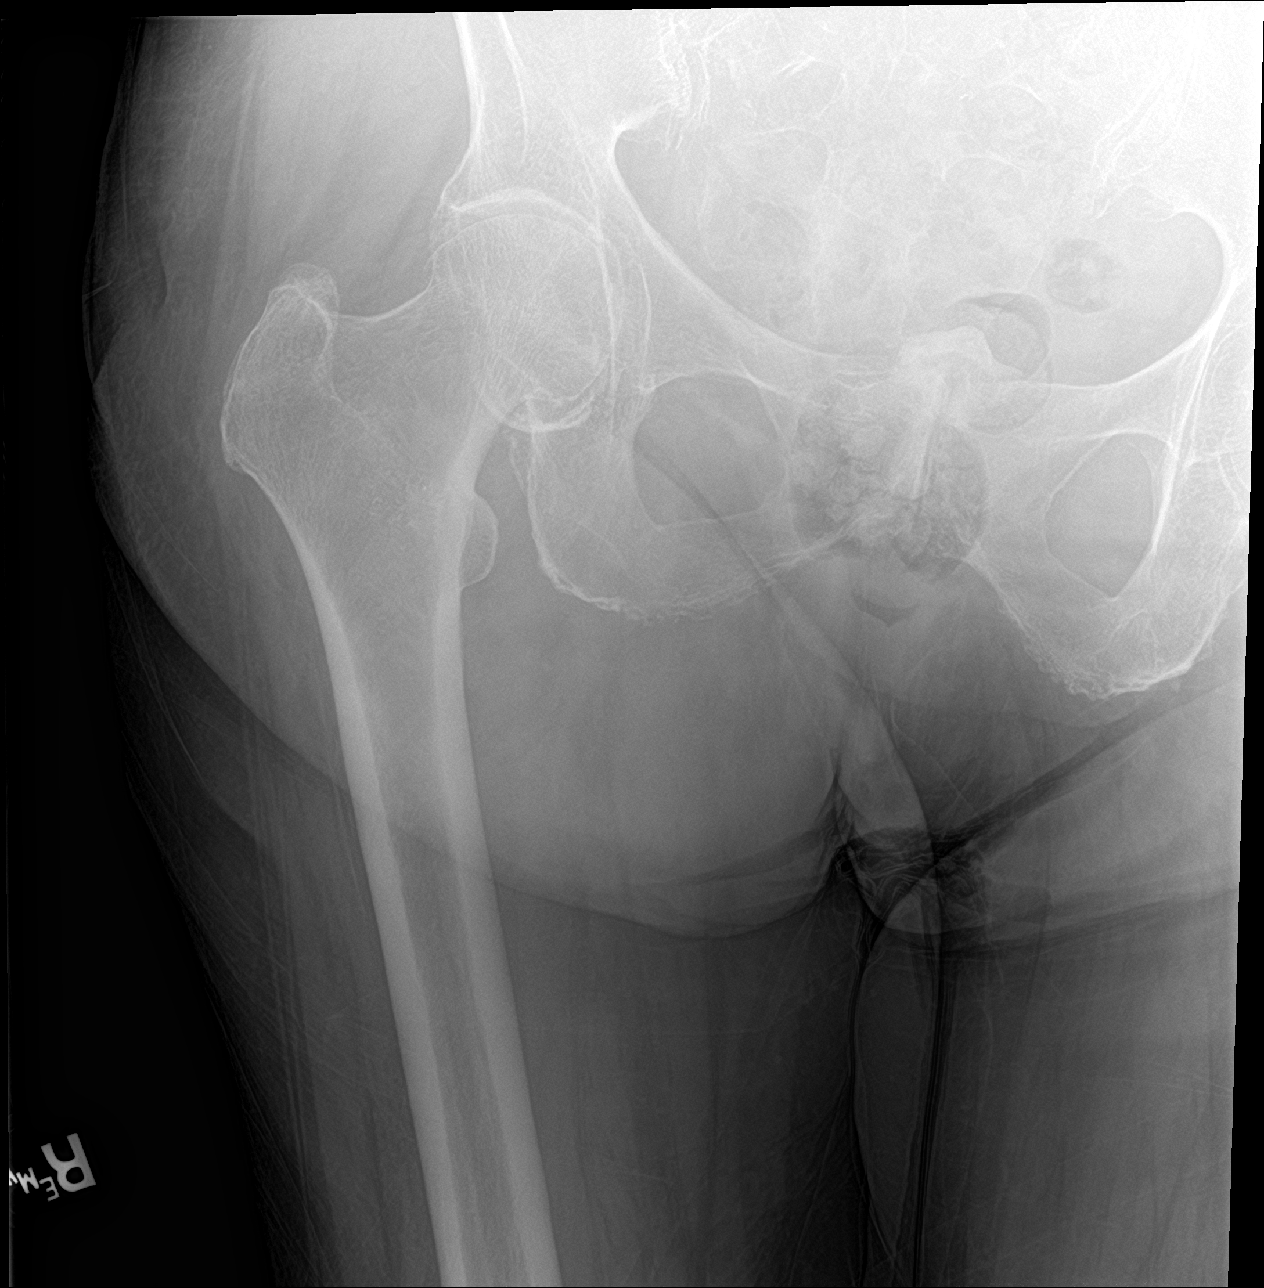

[hip lat]
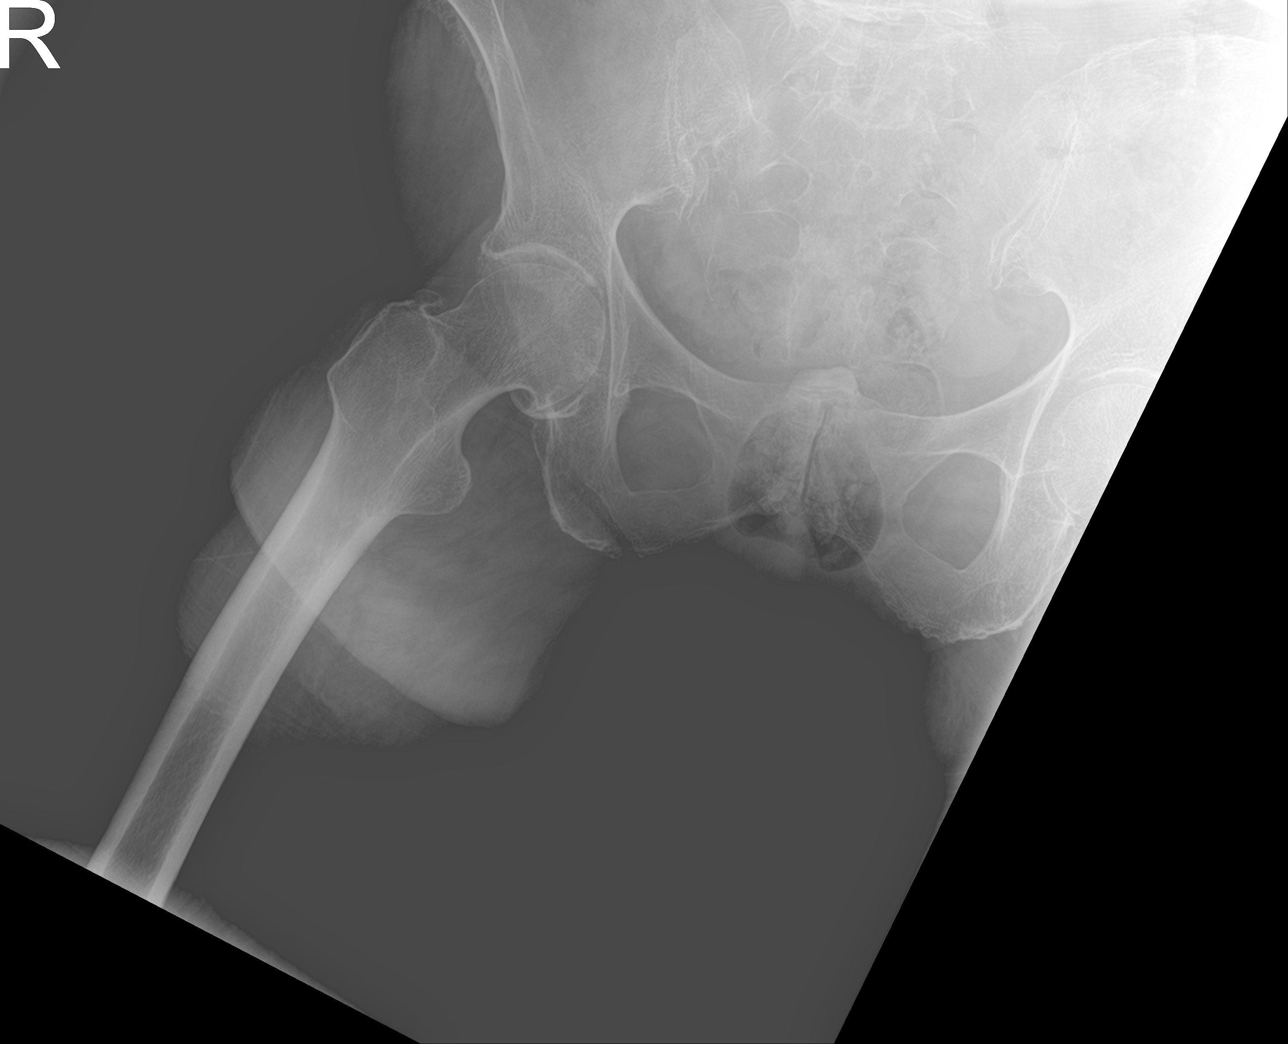

[hip frog leg]
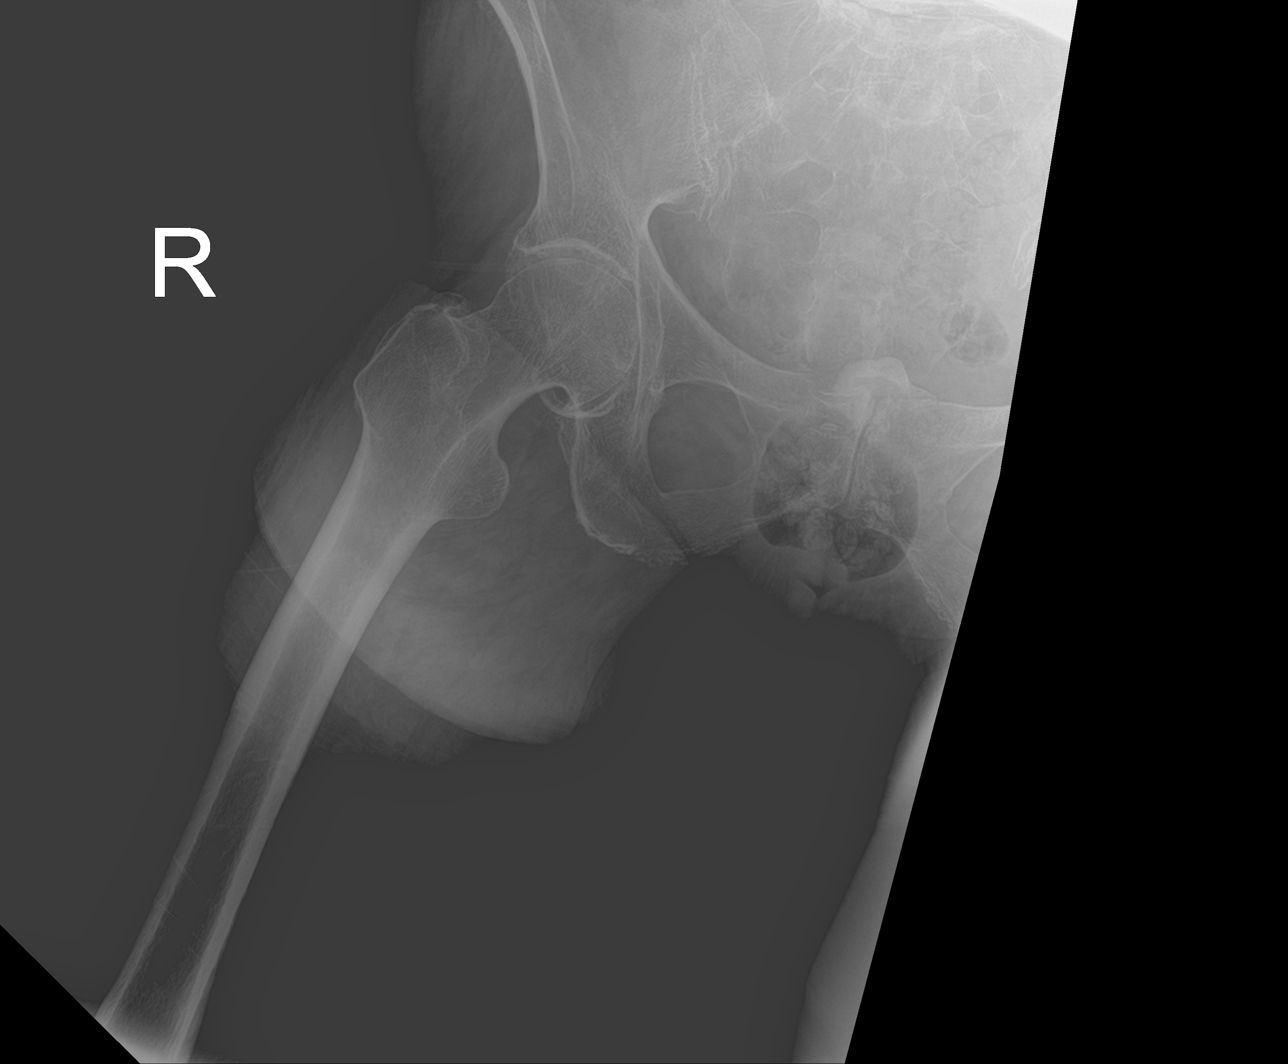

[4 of 4 positions shown; findings below may reference images not displayed]

FINDINGS: There is alignment at the right hip. No acute fracture. Changes of
osteoarthritis.
IMPRESSION: No acute fracture.

## 2022-01-27 DIAGNOSIS — R278 Other lack of coordination: Secondary | ICD-10-CM | POA: Diagnosis not present

## 2022-01-27 DIAGNOSIS — R2689 Other abnormalities of gait and mobility: Secondary | ICD-10-CM | POA: Diagnosis not present

## 2022-01-27 DIAGNOSIS — Z9181 History of falling: Secondary | ICD-10-CM | POA: Diagnosis not present

## 2022-01-27 DIAGNOSIS — M6281 Muscle weakness (generalized): Secondary | ICD-10-CM | POA: Diagnosis not present

## 2022-01-27 DIAGNOSIS — G20 Parkinson's disease: Secondary | ICD-10-CM | POA: Diagnosis not present

## 2022-01-28 ENCOUNTER — Other Ambulatory Visit: Payer: Self-pay | Admitting: Family Medicine

## 2022-01-28 DIAGNOSIS — R278 Other lack of coordination: Secondary | ICD-10-CM | POA: Diagnosis not present

## 2022-01-28 DIAGNOSIS — M6281 Muscle weakness (generalized): Secondary | ICD-10-CM | POA: Diagnosis not present

## 2022-01-28 DIAGNOSIS — R2689 Other abnormalities of gait and mobility: Secondary | ICD-10-CM | POA: Diagnosis not present

## 2022-01-28 DIAGNOSIS — N390 Urinary tract infection, site not specified: Secondary | ICD-10-CM

## 2022-01-28 DIAGNOSIS — Z9181 History of falling: Secondary | ICD-10-CM | POA: Diagnosis not present

## 2022-01-28 DIAGNOSIS — G20 Parkinson's disease: Secondary | ICD-10-CM | POA: Diagnosis not present

## 2022-01-28 MED ORDER — CEPHALEXIN 500 MG PO CAPS: 500.0000 mg | ORAL_CAPSULE | Freq: Two times a day (BID) | ORAL | 0 refills | Status: DC

## 2022-01-29 DIAGNOSIS — R41841 Cognitive communication deficit: Secondary | ICD-10-CM | POA: Diagnosis not present

## 2022-01-29 DIAGNOSIS — M6281 Muscle weakness (generalized): Secondary | ICD-10-CM | POA: Diagnosis not present

## 2022-01-29 DIAGNOSIS — Z9181 History of falling: Secondary | ICD-10-CM | POA: Diagnosis not present

## 2022-01-29 DIAGNOSIS — R278 Other lack of coordination: Secondary | ICD-10-CM | POA: Diagnosis not present

## 2022-01-29 DIAGNOSIS — R1312 Dysphagia, oropharyngeal phase: Secondary | ICD-10-CM | POA: Diagnosis not present

## 2022-01-29 NOTE — Telephone Encounter (Signed)
See my chart message

## 2022-02-01 DIAGNOSIS — Z9181 History of falling: Secondary | ICD-10-CM | POA: Diagnosis not present

## 2022-02-01 DIAGNOSIS — R278 Other lack of coordination: Secondary | ICD-10-CM | POA: Diagnosis not present

## 2022-02-01 DIAGNOSIS — R41841 Cognitive communication deficit: Secondary | ICD-10-CM | POA: Diagnosis not present

## 2022-02-01 DIAGNOSIS — R1312 Dysphagia, oropharyngeal phase: Secondary | ICD-10-CM | POA: Diagnosis not present

## 2022-02-01 DIAGNOSIS — M6281 Muscle weakness (generalized): Secondary | ICD-10-CM | POA: Diagnosis not present

## 2022-02-02 DIAGNOSIS — M6281 Muscle weakness (generalized): Secondary | ICD-10-CM | POA: Diagnosis not present

## 2022-02-02 DIAGNOSIS — Z9181 History of falling: Secondary | ICD-10-CM | POA: Diagnosis not present

## 2022-02-02 DIAGNOSIS — R278 Other lack of coordination: Secondary | ICD-10-CM | POA: Diagnosis not present

## 2022-02-02 DIAGNOSIS — R41841 Cognitive communication deficit: Secondary | ICD-10-CM | POA: Diagnosis not present

## 2022-02-02 DIAGNOSIS — R1312 Dysphagia, oropharyngeal phase: Secondary | ICD-10-CM | POA: Diagnosis not present

## 2022-02-03 DIAGNOSIS — M6281 Muscle weakness (generalized): Secondary | ICD-10-CM | POA: Diagnosis not present

## 2022-02-03 DIAGNOSIS — G20 Parkinson's disease: Secondary | ICD-10-CM | POA: Diagnosis not present

## 2022-02-03 DIAGNOSIS — R2689 Other abnormalities of gait and mobility: Secondary | ICD-10-CM | POA: Diagnosis not present

## 2022-02-04 DIAGNOSIS — M6281 Muscle weakness (generalized): Secondary | ICD-10-CM | POA: Diagnosis not present

## 2022-02-04 DIAGNOSIS — R2689 Other abnormalities of gait and mobility: Secondary | ICD-10-CM | POA: Diagnosis not present

## 2022-02-04 DIAGNOSIS — R1312 Dysphagia, oropharyngeal phase: Secondary | ICD-10-CM | POA: Diagnosis not present

## 2022-02-04 DIAGNOSIS — R41841 Cognitive communication deficit: Secondary | ICD-10-CM | POA: Diagnosis not present

## 2022-02-04 DIAGNOSIS — G20 Parkinson's disease: Secondary | ICD-10-CM | POA: Diagnosis not present

## 2022-02-05 DIAGNOSIS — Z9181 History of falling: Secondary | ICD-10-CM | POA: Diagnosis not present

## 2022-02-05 DIAGNOSIS — R278 Other lack of coordination: Secondary | ICD-10-CM | POA: Diagnosis not present

## 2022-02-05 DIAGNOSIS — M6281 Muscle weakness (generalized): Secondary | ICD-10-CM | POA: Diagnosis not present

## 2022-02-05 DIAGNOSIS — G20 Parkinson's disease: Secondary | ICD-10-CM | POA: Diagnosis not present

## 2022-02-05 DIAGNOSIS — R2689 Other abnormalities of gait and mobility: Secondary | ICD-10-CM | POA: Diagnosis not present

## 2022-02-08 DIAGNOSIS — R278 Other lack of coordination: Secondary | ICD-10-CM | POA: Diagnosis not present

## 2022-02-08 DIAGNOSIS — G20 Parkinson's disease: Secondary | ICD-10-CM | POA: Diagnosis not present

## 2022-02-08 DIAGNOSIS — Z9181 History of falling: Secondary | ICD-10-CM | POA: Diagnosis not present

## 2022-02-08 DIAGNOSIS — M6281 Muscle weakness (generalized): Secondary | ICD-10-CM | POA: Diagnosis not present

## 2022-02-08 DIAGNOSIS — R2689 Other abnormalities of gait and mobility: Secondary | ICD-10-CM | POA: Diagnosis not present

## 2022-02-09 DIAGNOSIS — R278 Other lack of coordination: Secondary | ICD-10-CM | POA: Diagnosis not present

## 2022-02-09 DIAGNOSIS — M6281 Muscle weakness (generalized): Secondary | ICD-10-CM | POA: Diagnosis not present

## 2022-02-09 DIAGNOSIS — Z9181 History of falling: Secondary | ICD-10-CM | POA: Diagnosis not present

## 2022-02-09 DIAGNOSIS — R1312 Dysphagia, oropharyngeal phase: Secondary | ICD-10-CM | POA: Diagnosis not present

## 2022-02-09 DIAGNOSIS — R41841 Cognitive communication deficit: Secondary | ICD-10-CM | POA: Diagnosis not present

## 2022-02-11 ENCOUNTER — Ambulatory Visit: Payer: Medicare Other | Admitting: Family Medicine

## 2022-02-11 DIAGNOSIS — R1312 Dysphagia, oropharyngeal phase: Secondary | ICD-10-CM | POA: Diagnosis not present

## 2022-02-11 DIAGNOSIS — R2689 Other abnormalities of gait and mobility: Secondary | ICD-10-CM | POA: Diagnosis not present

## 2022-02-11 DIAGNOSIS — R41841 Cognitive communication deficit: Secondary | ICD-10-CM | POA: Diagnosis not present

## 2022-02-11 DIAGNOSIS — G20 Parkinson's disease: Secondary | ICD-10-CM | POA: Diagnosis not present

## 2022-02-11 DIAGNOSIS — M6281 Muscle weakness (generalized): Secondary | ICD-10-CM | POA: Diagnosis not present

## 2022-02-11 NOTE — Progress Notes (Shared)
Subjective:   By signing my name below, I, Luiz Ochoa, attest that this documentation has been prepared under the direction and in the presence of Ann Held, DO  02/11/2022   Patient ID: Elizabeth Mccullough, female    DOB: [birth year], 81 y.o.   MRN: [id number]  No chief complaint on file.   HPI Patient is in today for follow up visit.    Past Medical History:  Diagnosis Date   AAA (abdominal aortic aneurysm) ([place])    Carotid artery disease ([place]) 01/12/2017   Carotid US 6/18: Bilat < 50%; repeat in 12/2000   Chronic combined systolic and diastolic CHF (congestive heart failure) ([place]) 03/01/2017   NICM // [place] 8/18: normal coronary arteries /  Echo 8/18: EF 20-25, mod AI, mod MR, PASP 32 // [place] 1/19: EF 55-60, no RWMA, Gr 1 DD, mod AI, mild MR, PASP 32   Hyperlipidemia    Parkinson's disease ([place])     Past Surgical History:  Procedure Laterality Date   BREAST CYST ASPIRATION  1965   Right Breast   FEMUR IM NAIL Left 09/30/2021   Procedure: INTRAMEDULLARY (IM) NAIL FEMORAL;  Surgeon: Rod Can, MD;  Location: [facility];  Service: Orthopedics;  Laterality: Left;   HAMMER TOE SURGERY  04/10/02   Left Toe   KNEE SURGERY Right 10/17/15   meniscus repair   NASAL SEPTUM SURGERY  1980   RIGHT/LEFT HEART CATH AND CORONARY ANGIOGRAPHY N/A 03/03/2017   Procedure: RIGHT/LEFT HEART CATH AND CORONARY ANGIOGRAPHY;  Surgeon: Nelva Bush, MD;  Location: [facility];  Service: Cardiovascular;  Laterality: N/A;   THORACIC AORTOGRAM N/A 03/03/2017   Procedure: Thoracic Aortogram;  Surgeon: Nelva Bush, MD;  Location: [facility];  Service: Cardiovascular;  Laterality: N/A;    Family History  Problem Relation Age of Onset   Heart disease Mother    Hyperlipidemia Sister    Hyperlipidemia Brother    Diabetes Sister    Hyperlipidemia Sister    Cancer Sister 49       colon   Cancer Sister        breast   Stroke Brother    Hypertension Brother     Breast cancer Other    Colon cancer Other    Prostate cancer Son    Diabetes Daughter    Stomach cancer Daughter    Healthy Daughter     Social History   Socioeconomic History   Marital status: Widowed    Spouse name: Not on file   Number of children: 4   Years of education: Not on file   Highest education level: 12th grade  Occupational History   Occupation: retired  Tobacco Use   Smoking status: Never   Smokeless tobacco: Never  Vaping Use   Vaping Use: Never used  Substance and Sexual Activity   Alcohol use: Yes    Comment: social wine    Drug use: No   Sexual activity: Not Currently    Partners: Male  Other Topics Concern   Not on file  Social History Narrative   Exercise-- no   Pt lives with daughter [name] at home   Social Determinants of Health   Financial Resource Strain: Low Risk  (08/31/2021)   Overall Financial Resource Strain (CARDIA)    Difficulty of Paying Living Expenses: Not hard at all  Food Insecurity: No Food Insecurity (08/31/2021)   Hunger Vital Sign    Worried About Running Out of Food in  the Last Year: Never true    [place] in the Last Year: Never true  Transportation Needs: No Transportation Needs (08/31/2021)   PRAPARE - Hydrologist (Medical): No    Lack of Transportation (Non-Medical): No  Physical Activity: Not on file  Stress: Not on file  Social Connections: Not on file  Intimate Partner Violence: Not At Risk (08/31/2021)   Humiliation, Afraid, Rape, and Kick questionnaire    Fear of Current or Ex-Partner: No    Emotionally Abused: No    Physically Abused: No    Sexually Abused: No    Outpatient Medications Prior to Visit  Medication Sig Dispense Refill   acetaminophen (TYLENOL) 325 MG tablet Take 2 tablets (650 mg total) by mouth every 6 (six) hours as needed for mild pain or headache.     ALPRAZolam (XANAX) 0.25 MG tablet Take 1 tablet (0.25 mg total) by mouth 3 (three) times daily as needed  for anxiety or sleep. 60 tablet 1   Carbidopa-Levodopa ER (SINEMET CR) 25-100 MG tablet controlled release TAKE 2 TABLETS BY MOUTH 3 TIMES DAILY. (Patient taking differently: Take 2 tablets by mouth in the morning, at noon, and at bedtime.) 540 tablet 1   cephALEXin (KEFLEX) 500 MG capsule Take 1 capsule (500 mg total) by mouth 2 (two) times daily. 20 capsule 0   docusate sodium (COLACE) 100 MG capsule Take 1 capsule (100 mg total) by mouth 2 (two) times daily.     enoxaparin (LOVENOX) 40 MG/0.4ML injection Inject 0.4 mLs (40 mg total) into the skin daily for 25 days. Discontinue after 11/01/2021 dose which will complete 30 days postop DVT prophylaxis.     furosemide (LASIX) 40 MG tablet TAKE 1 TABLET BY MOUTH DAILY. 90 tablet 1   HYDROcodone-acetaminophen (NORCO/VICODIN) 5-325 MG tablet Take 1 tablet by mouth every 4 (four) hours as needed for severe pain. 30 tablet 0   lisinopril (ZESTRIL) 10 MG tablet TAKE 1 TABLET BY MOUTH DAILY. (Patient taking differently: Take 10 mg by mouth daily.) 90 tablet 1   lovastatin (MEVACOR) 20 MG tablet TAKE 1 TABLET (20 MG TOTAL) BY MOUTH AT BEDTIME. 90 tablet 0   polyethylene glycol (MIRALAX / GLYCOLAX) 17 g packet Take 17 g by mouth daily as needed for mild constipation or moderate constipation.     potassium chloride (KLOR-CON) 10 MEQ tablet TAKE 1 TABLET BY MOUTH DAILY 90 tablet 1   senna (SENOKOT) 8.6 MG TABS tablet Take 1 tablet (8.6 mg total) by mouth 2 (two) times daily.     sertraline (ZOLOFT) 100 MG tablet TAKE 1 TABLET (100 MG TOTAL) BY MOUTH DAILY. 90 tablet 1   No facility-administered medications prior to visit.    Allergies  Allergen Reactions   Iohexol Rash     Code: RASH, Desc: PATIENT STATES SHE IS ALLERGIC TO IV DYE. 20 YRS AGO SHE HAD A REACTION AT [facility], PT WAS GIVEN BENADRYL. 07/13/06/RM, Onset Date: [id number]    Penicillins Hives    Review of Systems  Constitutional:  Negative for fever.  HENT:  Negative for congestion, sinus  pain and sore throat.   Respiratory:  Negative for cough, shortness of breath and wheezing.   Cardiovascular:  Negative for chest pain and palpitations.  Gastrointestinal:  Negative for abdominal pain, constipation, diarrhea, nausea and vomiting.  Genitourinary:  Negative for dysuria, frequency and hematuria.  Musculoskeletal:  Negative for joint pain and myalgias.  Neurological:  Negative for headaches.  Objective:    Physical Exam Constitutional:      Appearance: Normal appearance. She is not ill-appearing.  HENT:     Head: Normocephalic and atraumatic.     Right Ear: External ear normal.     Left Ear: External ear normal.  Eyes:     Extraocular Movements: Extraocular movements intact.     Pupils: Pupils are equal, round, and reactive to light.  Cardiovascular:     Rate and Rhythm: Normal rate and regular rhythm.     Pulses: Normal pulses.     Heart sounds: Normal heart sounds. No murmur heard.    No gallop.  Pulmonary:     Effort: Pulmonary effort is normal. No respiratory distress.     Breath sounds: Normal breath sounds. No wheezing or rales.  Skin:    General: Skin is warm and dry.  Neurological:     Mental Status: She is alert and oriented to person, place, and time.  Psychiatric:        Judgment: Judgment normal.     LMP 07/19/1988  Wt Readings from Last 3 Encounters:  09/30/21 143 lb 4.8 oz (65 kg)  08/31/21 121 lb (54.9 kg)  05/18/21 121 lb (54.9 kg)    Diabetic Foot Exam - Simple   No data filed    Lab Results  Component Value Date   WBC 4.7 12/01/2021   HGB 10.9 (L) 12/01/2021   HCT 32.7 (L) 12/01/2021   PLT 280.0 12/01/2021   GLUCOSE 112 (H) 12/01/2021   CHOL 178 12/01/2021   TRIG 95.0 12/01/2021   HDL 45.90 12/01/2021   LDLCALC 113 (H) 12/01/2021   ALT 7 12/01/2021   AST 11 12/01/2021   NA 142 12/01/2021   K 4.2 12/01/2021   CL 102 12/01/2021   CREATININE 0.80 12/01/2021   BUN 16 12/01/2021   CO2 33 (H) 12/01/2021   TSH 2.31  12/01/2021   INR 1.1 11/25/2019   HGBA1C 5.4 01/31/2019   MICROALBUR 0.2 01/16/2014    Lab Results  Component Value Date   TSH 2.31 12/01/2021   Lab Results  Component Value Date   WBC 4.7 12/01/2021   HGB 10.9 (L) 12/01/2021   HCT 32.7 (L) 12/01/2021   MCV 91.4 12/01/2021   PLT 280.0 12/01/2021   Lab Results  Component Value Date   NA 142 12/01/2021   K 4.2 12/01/2021   CO2 33 (H) 12/01/2021   GLUCOSE 112 (H) 12/01/2021   BUN 16 12/01/2021   CREATININE 0.80 12/01/2021   BILITOT 0.5 12/01/2021   ALKPHOS 176 (H) 12/01/2021   AST 11 12/01/2021   ALT 7 12/01/2021   PROT 6.1 12/01/2021   ALBUMIN 3.8 12/01/2021   CALCIUM 9.0 12/01/2021   ANIONGAP 7 10/06/2021   GFR 68.24 12/01/2021   Lab Results  Component Value Date   CHOL 178 12/01/2021   Lab Results  Component Value Date   HDL 45.90 12/01/2021   Lab Results  Component Value Date   LDLCALC 113 (H) 12/01/2021   Lab Results  Component Value Date   TRIG 95.0 12/01/2021   Lab Results  Component Value Date   CHOLHDL 4 12/01/2021   Lab Results  Component Value Date   HGBA1C 5.4 01/31/2019       Assessment & Plan:   Problem List Items Addressed This Visit   None    No orders of the defined types were placed in this encounter.   Johnette Abraham, personally preformed the services  described in this documentation.  All medical record entries made by the scribe were at my direction and in my presence.  I have reviewed the chart and discharge instructions (if applicable) and agree that the record reflects my personal performance and is accurate and complete. 02/11/2022   I,Tinashe Williams,acting as a scribe for Ann Held, DO.,have documented all relevant documentation on the behalf of Ann Held, DO,as directed by  Ann Held, DO while in the presence of Ann Held, DO.    Luiz Ochoa

## 2022-02-16 ENCOUNTER — Ambulatory Visit: Payer: Medicare Other | Admitting: Cardiovascular Disease

## 2022-02-16 ENCOUNTER — Encounter: Payer: Self-pay | Admitting: Cardiovascular Disease

## 2022-02-16 VITALS — BP 132/72 | HR 68 | Ht 62.0 in | Wt 114.2 lb

## 2022-02-16 DIAGNOSIS — R278 Other lack of coordination: Secondary | ICD-10-CM | POA: Diagnosis not present

## 2022-02-16 DIAGNOSIS — M6281 Muscle weakness (generalized): Secondary | ICD-10-CM | POA: Diagnosis not present

## 2022-02-16 DIAGNOSIS — I351 Nonrheumatic aortic (valve) insufficiency: Secondary | ICD-10-CM | POA: Diagnosis not present

## 2022-02-16 DIAGNOSIS — Z9181 History of falling: Secondary | ICD-10-CM | POA: Diagnosis not present

## 2022-02-16 NOTE — Patient Instructions (Signed)
Medication Instructions:  Your physician recommends that you continue on your current medications as directed. Please refer to the Current Medication list given to you today.  *If you need a refill on your cardiac medications before your next appointment, please call your pharmacy*   Lab Work: NONE If you have labs (blood work) drawn today and your tests are completely normal, you will receive your results only by: [facility] (if you have MyChart) OR A paper copy in the mail If you have any lab test that is abnormal or we need to change your treatment, we will call you to review the results.   Testing/Procedures: NONE   Follow-Up: At [facility], you and your health needs are our priority.  As part of our continuing mission to provide you with exceptional heart care, we have created designated Provider Care Teams.  These Care Teams include your primary Cardiologist (physician) and Advanced Practice Providers (APPs -  Physician Assistants and Nurse Practitioners) who all work together to provide you with the care you need, when you need it.  We recommend signing up for the patient portal called "MyChart".  Sign up information is provided on this After Visit Summary.  MyChart is used to connect with patients for Virtual Visits (Telemedicine).  Patients are able to view lab/test results, encounter notes, upcoming appointments, etc.  Non-urgent messages can be sent to your provider as well.   To learn more about what you can do with MyChart, go to [email or web address].    Your next appointment:   As Needed     Important Information About Sugar

## 2022-02-16 NOTE — Progress Notes (Signed)
Date:  02/16/2022   ID:  Elizabeth Mccullough, DOB 05/22/1941, MRN [id number]  PCP:  Carollee Herter, Alferd Apa, DO  Cardiologist:  Mertie Moores, MD    Referring MD: Carollee Herter, Alferd Apa, *   Problem list 1. 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 Hyperlipidemia 3.  Parkinson's disease 4.  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 3  5.  Mild carotid artery disease  Chief Complaint  Patient presents with   Congestive Heart Failure    Elizabeth Mccullough is a 81 y.o. female with a hx of Parkinsons, hyperlipidemia . 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 Recent echo shows new EF of 20-25%.  Previous echo in 2013  showed EF 55-60%.  Increased shortness of breath for the past several months .   No PND or orthopnea. No leg swelling .    Does not eat much salt  No CP.  Not able to exercise much secondary to frequent falling and parkinson .  Walks with a cane.   Oct. 23, 2018:     Elizabeth Mccullough had a cath since her last office visit Normal coronaries. 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 She is followed by Dr. Darcey Nora.   Was started on Coreg.   Is not as short of breath as she was previoiusly  She eats a very low-salt diet.  August 03, 2017: Elizabeth Mccullough is seen today for follow-up of her chronic combined systolic and diastolic congestive heart failure.  She has had a heart catheterization and was found to have normal coronary arteries.  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  She is seen and has been followed by Dr. Prescott Gum. No CP or dyspnea. Has some fatigue,  Is tired frequently .   BP is low ( on Losartan and Sinimet)   CHF was diagnosed in Aug. 2018.   She is not feeling any better since that time   Sept. 18, 2019:  Elizabeth Mccullough is seen back today for follow-up of her chronic combined systolic and  diastolic congestive heart failure.  She has normal coronary arteries by heart catheterization.   Most recent echocardiogram in January, 2019 reveals normal left ventricular systolic function with ejection fraction of 55 to 60%.  She has grade 1 diastolic dysfunction.  There is moderate aortic insufficiency.  She has moderate aortic insufficiency with mild dilatation of the aortic root and has been followed by Dr. Prescott Gum.  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  There is been no change of the past several years.  She is had some problems with orthostasis in the past.  We have titrated her medications slightly.  Has some questions for me today   She has stopped her Coreg ( daughter recommended that she stop it )  She also stopped her Losartan   Im not clear why she has stopped her Coreg and Losartan .  February 05, 2020: Elizabeth Mccullough is seen today for follow-up of her congestive heart failure.  She has chronic combined systolic and diastolic congestive heart failure.  She has normal coronary arteries by heart catheterization.  Her last echocardiogram was from January, 2019 which reveals normal left ventricular systolic function.  She has grade 1 diastolic dysfunction.  Has generally  low energy  Has Parkinsons diesease.   Aug. 1, 2023 Elizabeth Mccullough is seen today for follow up of her CHF Parkinsons disease - has progressed significantly  Eats a low salt diet   [place] and broke her hip several months ago      Past Medical History:  Diagnosis Date   AAA (abdominal aortic aneurysm) ([place])    Carotid artery disease ([place]) 01/12/2017   Carotid US 6/18: Bilat < 50%; repeat in 12/2000   Chronic combined systolic and diastolic CHF (congestive heart failure) ([place]) 03/01/2017   NICM // [place] 8/18: normal coronary arteries /  Echo 8/18: EF 20-25, mod AI, mod MR, PASP 32 // Echo 1/19: EF 55-60, no RWMA, Gr 1 DD, mod AI, mild MR, PASP 32    Hyperlipidemia    Parkinson's disease ([place])     Past Surgical History:  Procedure Laterality Date   BREAST CYST ASPIRATION  1965   Right Breast   FEMUR IM NAIL Left 09/30/2021   Procedure: INTRAMEDULLARY (IM) NAIL FEMORAL;  Surgeon: Rod Can, MD;  Location: [facility];  Service: Orthopedics;  Laterality: Left;   HAMMER TOE SURGERY  04/10/02   Left Toe   KNEE SURGERY Right 10/17/15   meniscus repair   NASAL SEPTUM SURGERY  1980   RIGHT/LEFT HEART CATH AND CORONARY ANGIOGRAPHY N/A 03/03/2017   Procedure: RIGHT/LEFT HEART CATH AND CORONARY ANGIOGRAPHY;  Surgeon: Nelva Bush, MD;  Location: [facility];  Service: Cardiovascular;  Laterality: N/A;   THORACIC AORTOGRAM N/A 03/03/2017   Procedure: Thoracic Aortogram;  Surgeon: Nelva Bush, MD;  Location: [place] [facility];  Service: Cardiovascular;  Laterality: N/A;    Current Medications: Current Meds  Medication Sig   acetaminophen (TYLENOL) 325 MG tablet Take 2 tablets (650 mg total) by mouth every 6 (six) hours as needed for mild pain or headache.   ALPRAZolam (XANAX) 0.25 MG tablet Take 1 tablet (0.25 mg total) by mouth 3 (three) times daily as needed for anxiety or sleep.   Carbidopa-Levodopa ER (SINEMET CR) 25-100 MG tablet controlled release TAKE 2 TABLETS BY MOUTH 3 TIMES DAILY. (Patient taking differently: Take 2 tablets by mouth in the morning, at noon, and at bedtime.)   cephALEXin (KEFLEX) 500 MG capsule Take 1 capsule (500 mg total) by mouth 2 (two) times daily.   docusate sodium (COLACE) 100 MG capsule Take 1 capsule (100 mg total) by mouth 2 (two) times daily.   furosemide (LASIX) 40 MG tablet TAKE 1 TABLET BY MOUTH DAILY.   HYDROcodone-acetaminophen (NORCO/VICODIN) 5-325 MG tablet Take 1 tablet by mouth every 4 (four) hours as needed for severe pain.   lisinopril (ZESTRIL) 10 MG tablet TAKE 1 TABLET BY MOUTH DAILY. (Patient taking differently: Take 10 mg by mouth daily.)   lovastatin (MEVACOR) 20 MG tablet  TAKE 1 TABLET (20 MG TOTAL) BY MOUTH AT BEDTIME.   polyethylene glycol (MIRALAX / GLYCOLAX) 17 g packet Take 17 g by mouth daily as needed for mild constipation or moderate constipation.   potassium chloride (KLOR-CON) 10 MEQ tablet TAKE 1 TABLET BY MOUTH DAILY   senna (SENOKOT) 8.6 MG TABS tablet Take 1 tablet (8.6 mg total) by mouth 2 (two) times daily.   sertraline (ZOLOFT) 100 MG tablet TAKE 1 TABLET (100 MG TOTAL) BY MOUTH DAILY.     Allergies:   Iohexol and Penicillins   Social History   Socioeconomic History   Marital status: Widowed    Spouse name: Not on file   Number  of children: 4   Years of education: Not on file   Highest education level: 12th grade  Occupational History   Occupation: retired  Tobacco Use   Smoking status: Never   Smokeless tobacco: Never  Vaping Use   Vaping Use: Never used  Substance and Sexual Activity   Alcohol use: Yes    Comment: social wine    Drug use: No   Sexual activity: Not Currently    Partners: Male  Other Topics Concern   Not on file  Social History Narrative   Exercise-- no   Pt lives with daughter [name] at home   Social Determinants of Health   Financial Resource Strain: [place]  (08/31/2021)   Overall Financial Resource Strain (CARDIA)    Difficulty of Paying Living Expenses: Not hard at all  Food Insecurity: No Food Insecurity (08/31/2021)   Hunger Vital Sign    Worried About Running Out of Food in the Last Year: Never true    [place] in the Last Year: Never true  Transportation Needs: No Transportation Needs (08/31/2021)   PRAPARE - Hydrologist (Medical): No    Lack of Transportation (Non-Medical): No  Physical Activity: Not on file  Stress: Not on file  Social Connections: Not on file     Family History: The patient's family history includes Breast cancer in an other family member; Cancer in her sister; Cancer (age of onset: 46) in her sister; Colon cancer in an other family  member; Diabetes in her daughter and sister; Healthy in her daughter; Heart disease in her mother; Hyperlipidemia in her brother, sister, and sister; Hypertension in her brother; Prostate cancer in her son; Stomach cancer in her daughter; Stroke in her brother. ROS:   Please see the history of present illness.     All other systems reviewed and are negative.  EKGs/Labs/Other Studies Reviewed:       EKG:     .     Recent Labs: 10/05/2021: Magnesium 2.1 12/01/2021: ALT 7; BUN 16; Creatinine, Ser 0.80; Hemoglobin 10.9; Platelets 280.0; Potassium 4.2; Sodium 142; TSH 2.31  Recent Lipid Panel    Component Value Date/Time   CHOL 178 12/01/2021 1044   CHOL 171 01/31/2019 0913   TRIG 95.0 12/01/2021 1044   TRIG 78 07/04/2006 1313   HDL 45.90 12/01/2021 1044   HDL 72 01/31/2019 0913   CHOLHDL 4 12/01/2021 1044   VLDL 19.0 12/01/2021 1044   LDLCALC 113 (H) 12/01/2021 1044   LDLCALC 88 01/31/2019 0913    Physical Exam:     Physical Exam: Blood pressure 132/72, pulse 68, height '5\' 2"'$  (1.575 m), weight 114 lb 3.2 oz (51.8 kg), last menstrual period 07/19/1988.  GEN:  Well nourished, well developed in no acute distress HEENT: Normal NECK: No JVD; No carotid bruits LYMPHATICS: No lymphadenopathy CARDIAC: RRR ,  2/6 diastolic murmur  RESPIRATORY:  Clear to auscultation without rales, wheezing or rhonchi  ABDOMEN: Soft, non-tender, non-distended MUSCULOSKELETAL:  No edema; No deformity  SKIN: Warm and dry NEUROLOGIC:  Alert and oriented x 3     ASSESSMENT:    1. Aortic valve insufficiency, etiology of cardiac valve disease unspecified    PLAN:       Chronic combined systolic / diastolic CHF:    Stable At this point, her dementia has progressed  Parkinsons is severe   At this point, I would concentrate on keeping her safe and comfortable I do not anticipate  having her do any more cardiac testing .   We will see her on an as needed basis     We will discontinue the  losartan and she will follow up with her primary medical doctor.  2. Aortic insufficiency -  Stable .  She is not a candidate for any valve procedure or surgeries     3. Mitral regurgitation -  stable   4. Aortic root dilatation:   will continue to follow.  She is not a candidate for repair .    Medication Adjustments/Labs and Tests Ordered: Current medicines are reviewed at length with the patient today.  Concerns regarding medicines are outlined above.  No orders of the defined types were placed in this encounter.  No orders of the defined types were placed in this encounter.    Signed, Mertie Moores, MD  02/16/2022 5:44 PM    [place]

## 2022-02-17 DIAGNOSIS — R2689 Other abnormalities of gait and mobility: Secondary | ICD-10-CM | POA: Diagnosis not present

## 2022-02-17 DIAGNOSIS — R1312 Dysphagia, oropharyngeal phase: Secondary | ICD-10-CM | POA: Diagnosis not present

## 2022-02-17 DIAGNOSIS — Z9181 History of falling: Secondary | ICD-10-CM | POA: Diagnosis not present

## 2022-02-17 DIAGNOSIS — R278 Other lack of coordination: Secondary | ICD-10-CM | POA: Diagnosis not present

## 2022-02-17 DIAGNOSIS — R41841 Cognitive communication deficit: Secondary | ICD-10-CM | POA: Diagnosis not present

## 2022-02-17 DIAGNOSIS — M6281 Muscle weakness (generalized): Secondary | ICD-10-CM | POA: Diagnosis not present

## 2022-02-17 DIAGNOSIS — G20 Parkinson's disease: Secondary | ICD-10-CM | POA: Diagnosis not present

## 2022-02-18 DIAGNOSIS — R2689 Other abnormalities of gait and mobility: Secondary | ICD-10-CM | POA: Diagnosis not present

## 2022-02-18 DIAGNOSIS — Z9181 History of falling: Secondary | ICD-10-CM | POA: Diagnosis not present

## 2022-02-18 DIAGNOSIS — G20 Parkinson's disease: Secondary | ICD-10-CM | POA: Diagnosis not present

## 2022-02-18 DIAGNOSIS — M6281 Muscle weakness (generalized): Secondary | ICD-10-CM | POA: Diagnosis not present

## 2022-02-18 DIAGNOSIS — R278 Other lack of coordination: Secondary | ICD-10-CM | POA: Diagnosis not present

## 2022-02-22 DIAGNOSIS — R1312 Dysphagia, oropharyngeal phase: Secondary | ICD-10-CM | POA: Diagnosis not present

## 2022-02-22 DIAGNOSIS — R41841 Cognitive communication deficit: Secondary | ICD-10-CM | POA: Diagnosis not present

## 2022-02-23 DIAGNOSIS — I5032 Chronic diastolic (congestive) heart failure: Secondary | ICD-10-CM | POA: Diagnosis not present

## 2022-02-23 DIAGNOSIS — R2689 Other abnormalities of gait and mobility: Secondary | ICD-10-CM | POA: Diagnosis not present

## 2022-02-23 DIAGNOSIS — G20 Parkinson's disease: Secondary | ICD-10-CM | POA: Diagnosis not present

## 2022-02-23 DIAGNOSIS — M6281 Muscle weakness (generalized): Secondary | ICD-10-CM | POA: Diagnosis not present

## 2022-02-23 DIAGNOSIS — R4182 Altered mental status, unspecified: Secondary | ICD-10-CM | POA: Diagnosis not present

## 2022-02-23 DIAGNOSIS — S72002D Fracture of unspecified part of neck of left femur, subsequent encounter for closed fracture with routine healing: Secondary | ICD-10-CM | POA: Diagnosis not present

## 2022-02-23 DIAGNOSIS — N170 Acute kidney failure with tubular necrosis: Secondary | ICD-10-CM | POA: Diagnosis not present

## 2022-02-24 DIAGNOSIS — R1312 Dysphagia, oropharyngeal phase: Secondary | ICD-10-CM | POA: Diagnosis not present

## 2022-02-24 DIAGNOSIS — R41841 Cognitive communication deficit: Secondary | ICD-10-CM | POA: Diagnosis not present

## 2022-02-24 DIAGNOSIS — M6281 Muscle weakness (generalized): Secondary | ICD-10-CM | POA: Diagnosis not present

## 2022-02-24 DIAGNOSIS — Z9181 History of falling: Secondary | ICD-10-CM | POA: Diagnosis not present

## 2022-02-24 DIAGNOSIS — R2689 Other abnormalities of gait and mobility: Secondary | ICD-10-CM | POA: Diagnosis not present

## 2022-02-24 DIAGNOSIS — G20 Parkinson's disease: Secondary | ICD-10-CM | POA: Diagnosis not present

## 2022-02-24 DIAGNOSIS — R278 Other lack of coordination: Secondary | ICD-10-CM | POA: Diagnosis not present

## 2022-02-25 DIAGNOSIS — G20 Parkinson's disease: Secondary | ICD-10-CM | POA: Diagnosis not present

## 2022-02-25 DIAGNOSIS — R1312 Dysphagia, oropharyngeal phase: Secondary | ICD-10-CM | POA: Diagnosis not present

## 2022-02-25 DIAGNOSIS — Z9181 History of falling: Secondary | ICD-10-CM | POA: Diagnosis not present

## 2022-02-25 DIAGNOSIS — R278 Other lack of coordination: Secondary | ICD-10-CM | POA: Diagnosis not present

## 2022-02-25 DIAGNOSIS — R41841 Cognitive communication deficit: Secondary | ICD-10-CM | POA: Diagnosis not present

## 2022-02-25 DIAGNOSIS — R2689 Other abnormalities of gait and mobility: Secondary | ICD-10-CM | POA: Diagnosis not present

## 2022-02-25 DIAGNOSIS — M6281 Muscle weakness (generalized): Secondary | ICD-10-CM | POA: Diagnosis not present

## 2022-02-26 DIAGNOSIS — R278 Other lack of coordination: Secondary | ICD-10-CM | POA: Diagnosis not present

## 2022-02-26 DIAGNOSIS — M6281 Muscle weakness (generalized): Secondary | ICD-10-CM | POA: Diagnosis not present

## 2022-02-26 DIAGNOSIS — Z9181 History of falling: Secondary | ICD-10-CM | POA: Diagnosis not present

## 2022-03-01 DIAGNOSIS — R278 Other lack of coordination: Secondary | ICD-10-CM | POA: Diagnosis not present

## 2022-03-01 DIAGNOSIS — M6281 Muscle weakness (generalized): Secondary | ICD-10-CM | POA: Diagnosis not present

## 2022-03-01 DIAGNOSIS — Z9181 History of falling: Secondary | ICD-10-CM | POA: Diagnosis not present

## 2022-03-02 DIAGNOSIS — R278 Other lack of coordination: Secondary | ICD-10-CM | POA: Diagnosis not present

## 2022-03-02 DIAGNOSIS — M6281 Muscle weakness (generalized): Secondary | ICD-10-CM | POA: Diagnosis not present

## 2022-03-02 DIAGNOSIS — Z9181 History of falling: Secondary | ICD-10-CM | POA: Diagnosis not present

## 2022-03-03 DIAGNOSIS — R1312 Dysphagia, oropharyngeal phase: Secondary | ICD-10-CM | POA: Diagnosis not present

## 2022-03-03 DIAGNOSIS — R41841 Cognitive communication deficit: Secondary | ICD-10-CM | POA: Diagnosis not present

## 2022-03-05 DIAGNOSIS — R41841 Cognitive communication deficit: Secondary | ICD-10-CM | POA: Diagnosis not present

## 2022-03-05 DIAGNOSIS — R1312 Dysphagia, oropharyngeal phase: Secondary | ICD-10-CM | POA: Diagnosis not present

## 2022-03-05 DIAGNOSIS — Z9181 History of falling: Secondary | ICD-10-CM | POA: Diagnosis not present

## 2022-03-05 DIAGNOSIS — M6281 Muscle weakness (generalized): Secondary | ICD-10-CM | POA: Diagnosis not present

## 2022-03-05 DIAGNOSIS — R278 Other lack of coordination: Secondary | ICD-10-CM | POA: Diagnosis not present

## 2022-03-08 DIAGNOSIS — R278 Other lack of coordination: Secondary | ICD-10-CM | POA: Diagnosis not present

## 2022-03-08 DIAGNOSIS — R41841 Cognitive communication deficit: Secondary | ICD-10-CM | POA: Diagnosis not present

## 2022-03-08 DIAGNOSIS — Z9181 History of falling: Secondary | ICD-10-CM | POA: Diagnosis not present

## 2022-03-08 DIAGNOSIS — R1312 Dysphagia, oropharyngeal phase: Secondary | ICD-10-CM | POA: Diagnosis not present

## 2022-03-08 DIAGNOSIS — M6281 Muscle weakness (generalized): Secondary | ICD-10-CM | POA: Diagnosis not present

## 2022-03-11 DIAGNOSIS — I150 Renovascular hypertension: Secondary | ICD-10-CM | POA: Diagnosis not present

## 2022-03-11 DIAGNOSIS — R269 Unspecified abnormalities of gait and mobility: Secondary | ICD-10-CM | POA: Diagnosis not present

## 2022-03-11 DIAGNOSIS — I5040 Unspecified combined systolic (congestive) and diastolic (congestive) heart failure: Secondary | ICD-10-CM | POA: Diagnosis not present

## 2022-03-11 DIAGNOSIS — G20 Parkinson's disease: Secondary | ICD-10-CM | POA: Diagnosis not present

## 2022-03-25 DIAGNOSIS — I6529 Occlusion and stenosis of unspecified carotid artery: Secondary | ICD-10-CM | POA: Diagnosis not present

## 2022-03-25 DIAGNOSIS — F0283 Dementia in other diseases classified elsewhere, unspecified severity, with mood disturbance: Secondary | ICD-10-CM | POA: Diagnosis not present

## 2022-03-25 DIAGNOSIS — K219 Gastro-esophageal reflux disease without esophagitis: Secondary | ICD-10-CM | POA: Diagnosis not present

## 2022-03-25 DIAGNOSIS — E876 Hypokalemia: Secondary | ICD-10-CM | POA: Diagnosis not present

## 2022-03-25 DIAGNOSIS — Z9181 History of falling: Secondary | ICD-10-CM | POA: Diagnosis not present

## 2022-03-25 DIAGNOSIS — F32A Depression, unspecified: Secondary | ICD-10-CM | POA: Diagnosis not present

## 2022-03-25 DIAGNOSIS — I351 Nonrheumatic aortic (valve) insufficiency: Secondary | ICD-10-CM | POA: Diagnosis not present

## 2022-03-25 DIAGNOSIS — F0284 Dementia in other diseases classified elsewhere, unspecified severity, with anxiety: Secondary | ICD-10-CM | POA: Diagnosis not present

## 2022-03-25 DIAGNOSIS — I714 Abdominal aortic aneurysm, without rupture, unspecified: Secondary | ICD-10-CM | POA: Diagnosis not present

## 2022-03-25 DIAGNOSIS — G20 Parkinson's disease: Secondary | ICD-10-CM | POA: Diagnosis not present

## 2022-03-25 DIAGNOSIS — I5042 Chronic combined systolic (congestive) and diastolic (congestive) heart failure: Secondary | ICD-10-CM | POA: Diagnosis not present

## 2022-03-25 DIAGNOSIS — I150 Renovascular hypertension: Secondary | ICD-10-CM | POA: Diagnosis not present

## 2022-03-25 DIAGNOSIS — N182 Chronic kidney disease, stage 2 (mild): Secondary | ICD-10-CM | POA: Diagnosis not present

## 2022-03-25 DIAGNOSIS — K5900 Constipation, unspecified: Secondary | ICD-10-CM | POA: Diagnosis not present

## 2022-03-25 DIAGNOSIS — E7800 Pure hypercholesterolemia, unspecified: Secondary | ICD-10-CM | POA: Diagnosis not present

## 2022-03-26 DIAGNOSIS — I5032 Chronic diastolic (congestive) heart failure: Secondary | ICD-10-CM | POA: Diagnosis not present

## 2022-03-26 DIAGNOSIS — S72002D Fracture of unspecified part of neck of left femur, subsequent encounter for closed fracture with routine healing: Secondary | ICD-10-CM | POA: Diagnosis not present

## 2022-03-26 DIAGNOSIS — R4182 Altered mental status, unspecified: Secondary | ICD-10-CM | POA: Diagnosis not present

## 2022-03-26 DIAGNOSIS — N170 Acute kidney failure with tubular necrosis: Secondary | ICD-10-CM | POA: Diagnosis not present

## 2022-03-30 DIAGNOSIS — E559 Vitamin D deficiency, unspecified: Secondary | ICD-10-CM | POA: Diagnosis not present

## 2022-03-30 DIAGNOSIS — E0829 Diabetes mellitus due to underlying condition with other diabetic kidney complication: Secondary | ICD-10-CM | POA: Diagnosis not present

## 2022-03-30 DIAGNOSIS — E039 Hypothyroidism, unspecified: Secondary | ICD-10-CM | POA: Diagnosis not present

## 2022-03-30 DIAGNOSIS — D519 Vitamin B12 deficiency anemia, unspecified: Secondary | ICD-10-CM | POA: Diagnosis not present

## 2022-04-07 DIAGNOSIS — F03918 Unspecified dementia, unspecified severity, with other behavioral disturbance: Secondary | ICD-10-CM | POA: Diagnosis not present

## 2022-04-07 DIAGNOSIS — F411 Generalized anxiety disorder: Secondary | ICD-10-CM | POA: Diagnosis not present

## 2022-04-07 DIAGNOSIS — F330 Major depressive disorder, recurrent, mild: Secondary | ICD-10-CM | POA: Diagnosis not present

## 2022-04-20 DIAGNOSIS — G3109 Other frontotemporal dementia: Secondary | ICD-10-CM | POA: Diagnosis not present

## 2022-04-20 DIAGNOSIS — F0284 Dementia in other diseases classified elsewhere, unspecified severity, with anxiety: Secondary | ICD-10-CM | POA: Diagnosis not present

## 2022-04-20 DIAGNOSIS — N182 Chronic kidney disease, stage 2 (mild): Secondary | ICD-10-CM | POA: Diagnosis not present

## 2022-04-20 DIAGNOSIS — R82998 Other abnormal findings in urine: Secondary | ICD-10-CM | POA: Diagnosis not present

## 2022-04-25 DIAGNOSIS — N170 Acute kidney failure with tubular necrosis: Secondary | ICD-10-CM | POA: Diagnosis not present

## 2022-04-25 DIAGNOSIS — I5032 Chronic diastolic (congestive) heart failure: Secondary | ICD-10-CM | POA: Diagnosis not present

## 2022-04-25 DIAGNOSIS — S72002D Fracture of unspecified part of neck of left femur, subsequent encounter for closed fracture with routine healing: Secondary | ICD-10-CM | POA: Diagnosis not present

## 2022-04-25 DIAGNOSIS — R4182 Altered mental status, unspecified: Secondary | ICD-10-CM | POA: Diagnosis not present

## 2022-04-27 DIAGNOSIS — K219 Gastro-esophageal reflux disease without esophagitis: Secondary | ICD-10-CM | POA: Diagnosis not present

## 2022-04-27 DIAGNOSIS — I6529 Occlusion and stenosis of unspecified carotid artery: Secondary | ICD-10-CM | POA: Diagnosis not present

## 2022-04-27 DIAGNOSIS — F0284 Dementia in other diseases classified elsewhere, unspecified severity, with anxiety: Secondary | ICD-10-CM | POA: Diagnosis not present

## 2022-04-27 DIAGNOSIS — N182 Chronic kidney disease, stage 2 (mild): Secondary | ICD-10-CM | POA: Diagnosis not present

## 2022-04-27 DIAGNOSIS — F0283 Dementia in other diseases classified elsewhere, unspecified severity, with mood disturbance: Secondary | ICD-10-CM | POA: Diagnosis not present

## 2022-04-27 DIAGNOSIS — E876 Hypokalemia: Secondary | ICD-10-CM | POA: Diagnosis not present

## 2022-04-27 DIAGNOSIS — I150 Renovascular hypertension: Secondary | ICD-10-CM | POA: Diagnosis not present

## 2022-04-27 DIAGNOSIS — I5042 Chronic combined systolic (congestive) and diastolic (congestive) heart failure: Secondary | ICD-10-CM | POA: Diagnosis not present

## 2022-04-27 DIAGNOSIS — R32 Unspecified urinary incontinence: Secondary | ICD-10-CM | POA: Diagnosis not present

## 2022-04-27 DIAGNOSIS — F32A Depression, unspecified: Secondary | ICD-10-CM | POA: Diagnosis not present

## 2022-04-27 DIAGNOSIS — K5900 Constipation, unspecified: Secondary | ICD-10-CM | POA: Diagnosis not present

## 2022-04-27 DIAGNOSIS — G20B1 Parkinson's disease with dyskinesia, without mention of fluctuations: Secondary | ICD-10-CM | POA: Diagnosis not present

## 2022-04-27 DIAGNOSIS — I351 Nonrheumatic aortic (valve) insufficiency: Secondary | ICD-10-CM | POA: Diagnosis not present

## 2022-04-27 DIAGNOSIS — E7800 Pure hypercholesterolemia, unspecified: Secondary | ICD-10-CM | POA: Diagnosis not present

## 2022-04-27 DIAGNOSIS — Z9181 History of falling: Secondary | ICD-10-CM | POA: Diagnosis not present

## 2022-04-27 DIAGNOSIS — I714 Abdominal aortic aneurysm, without rupture, unspecified: Secondary | ICD-10-CM | POA: Diagnosis not present

## 2022-05-26 DIAGNOSIS — S72002D Fracture of unspecified part of neck of left femur, subsequent encounter for closed fracture with routine healing: Secondary | ICD-10-CM | POA: Diagnosis not present

## 2022-05-26 DIAGNOSIS — I5032 Chronic diastolic (congestive) heart failure: Secondary | ICD-10-CM | POA: Diagnosis not present

## 2022-05-26 DIAGNOSIS — R4182 Altered mental status, unspecified: Secondary | ICD-10-CM | POA: Diagnosis not present

## 2022-05-26 DIAGNOSIS — N170 Acute kidney failure with tubular necrosis: Secondary | ICD-10-CM | POA: Diagnosis not present

## 2022-05-27 DIAGNOSIS — I714 Abdominal aortic aneurysm, without rupture, unspecified: Secondary | ICD-10-CM | POA: Diagnosis not present

## 2022-05-27 DIAGNOSIS — I5040 Unspecified combined systolic (congestive) and diastolic (congestive) heart failure: Secondary | ICD-10-CM | POA: Diagnosis not present

## 2022-05-27 DIAGNOSIS — F32A Depression, unspecified: Secondary | ICD-10-CM | POA: Diagnosis not present

## 2022-05-27 DIAGNOSIS — R269 Unspecified abnormalities of gait and mobility: Secondary | ICD-10-CM | POA: Diagnosis not present

## 2022-06-02 DIAGNOSIS — F411 Generalized anxiety disorder: Secondary | ICD-10-CM | POA: Diagnosis not present

## 2022-06-02 DIAGNOSIS — F03918 Unspecified dementia, unspecified severity, with other behavioral disturbance: Secondary | ICD-10-CM | POA: Diagnosis not present

## 2022-06-02 DIAGNOSIS — F330 Major depressive disorder, recurrent, mild: Secondary | ICD-10-CM | POA: Diagnosis not present

## 2022-06-03 DIAGNOSIS — R4182 Altered mental status, unspecified: Secondary | ICD-10-CM | POA: Diagnosis not present

## 2022-06-16 DIAGNOSIS — F03C4 Unspecified dementia, severe, with anxiety: Secondary | ICD-10-CM | POA: Diagnosis not present

## 2022-06-16 DIAGNOSIS — F330 Major depressive disorder, recurrent, mild: Secondary | ICD-10-CM | POA: Diagnosis not present

## 2022-06-25 DIAGNOSIS — S72002D Fracture of unspecified part of neck of left femur, subsequent encounter for closed fracture with routine healing: Secondary | ICD-10-CM | POA: Diagnosis not present

## 2022-06-25 DIAGNOSIS — N170 Acute kidney failure with tubular necrosis: Secondary | ICD-10-CM | POA: Diagnosis not present

## 2022-06-25 DIAGNOSIS — R4182 Altered mental status, unspecified: Secondary | ICD-10-CM | POA: Diagnosis not present

## 2022-06-25 DIAGNOSIS — I5032 Chronic diastolic (congestive) heart failure: Secondary | ICD-10-CM | POA: Diagnosis not present

## 2022-07-01 DIAGNOSIS — R269 Unspecified abnormalities of gait and mobility: Secondary | ICD-10-CM | POA: Diagnosis not present

## 2022-07-01 DIAGNOSIS — I5040 Unspecified combined systolic (congestive) and diastolic (congestive) heart failure: Secondary | ICD-10-CM | POA: Diagnosis not present

## 2022-07-01 DIAGNOSIS — I714 Abdominal aortic aneurysm, without rupture, unspecified: Secondary | ICD-10-CM | POA: Diagnosis not present

## 2022-07-01 DIAGNOSIS — F32A Depression, unspecified: Secondary | ICD-10-CM | POA: Diagnosis not present

## 2022-07-26 DIAGNOSIS — S72002D Fracture of unspecified part of neck of left femur, subsequent encounter for closed fracture with routine healing: Secondary | ICD-10-CM | POA: Diagnosis not present

## 2022-07-28 DIAGNOSIS — F411 Generalized anxiety disorder: Secondary | ICD-10-CM | POA: Diagnosis not present

## 2022-07-28 DIAGNOSIS — F330 Major depressive disorder, recurrent, mild: Secondary | ICD-10-CM | POA: Diagnosis not present

## 2022-07-28 DIAGNOSIS — F03C4 Unspecified dementia, severe, with anxiety: Secondary | ICD-10-CM | POA: Diagnosis not present

## 2022-08-05 DIAGNOSIS — Z9181 History of falling: Secondary | ICD-10-CM | POA: Diagnosis not present

## 2022-08-05 DIAGNOSIS — F32A Depression, unspecified: Secondary | ICD-10-CM | POA: Diagnosis not present

## 2022-08-05 DIAGNOSIS — F02818 Dementia in other diseases classified elsewhere, unspecified severity, with other behavioral disturbance: Secondary | ICD-10-CM | POA: Diagnosis not present

## 2022-08-05 DIAGNOSIS — K219 Gastro-esophageal reflux disease without esophagitis: Secondary | ICD-10-CM | POA: Diagnosis not present

## 2022-08-05 DIAGNOSIS — F0283 Dementia in other diseases classified elsewhere, unspecified severity, with mood disturbance: Secondary | ICD-10-CM | POA: Diagnosis not present

## 2022-08-05 DIAGNOSIS — I10 Essential (primary) hypertension: Secondary | ICD-10-CM | POA: Diagnosis not present

## 2022-08-05 DIAGNOSIS — F05 Delirium due to known physiological condition: Secondary | ICD-10-CM | POA: Diagnosis not present

## 2022-08-05 DIAGNOSIS — G20A1 Parkinson's disease without dyskinesia, without mention of fluctuations: Secondary | ICD-10-CM | POA: Diagnosis not present

## 2022-08-05 DIAGNOSIS — E785 Hyperlipidemia, unspecified: Secondary | ICD-10-CM | POA: Diagnosis not present

## 2022-08-10 DIAGNOSIS — R296 Repeated falls: Secondary | ICD-10-CM | POA: Diagnosis not present

## 2022-08-10 DIAGNOSIS — F02818 Dementia in other diseases classified elsewhere, unspecified severity, with other behavioral disturbance: Secondary | ICD-10-CM | POA: Diagnosis not present

## 2022-08-10 DIAGNOSIS — I150 Renovascular hypertension: Secondary | ICD-10-CM | POA: Diagnosis not present

## 2022-08-10 DIAGNOSIS — F05 Delirium due to known physiological condition: Secondary | ICD-10-CM | POA: Diagnosis not present

## 2022-08-10 DIAGNOSIS — F0280 Dementia in other diseases classified elsewhere without behavioral disturbance: Secondary | ICD-10-CM | POA: Diagnosis not present

## 2022-08-10 DIAGNOSIS — F0283 Dementia in other diseases classified elsewhere, unspecified severity, with mood disturbance: Secondary | ICD-10-CM | POA: Diagnosis not present

## 2022-08-10 DIAGNOSIS — F32A Depression, unspecified: Secondary | ICD-10-CM | POA: Diagnosis not present

## 2022-08-10 DIAGNOSIS — E785 Hyperlipidemia, unspecified: Secondary | ICD-10-CM | POA: Diagnosis not present

## 2022-08-10 DIAGNOSIS — I10 Essential (primary) hypertension: Secondary | ICD-10-CM | POA: Diagnosis not present

## 2022-08-10 DIAGNOSIS — G20A1 Parkinson's disease without dyskinesia, without mention of fluctuations: Secondary | ICD-10-CM | POA: Diagnosis not present

## 2022-08-10 DIAGNOSIS — Z9181 History of falling: Secondary | ICD-10-CM | POA: Diagnosis not present

## 2022-08-10 DIAGNOSIS — K219 Gastro-esophageal reflux disease without esophagitis: Secondary | ICD-10-CM | POA: Diagnosis not present

## 2022-08-12 DIAGNOSIS — K219 Gastro-esophageal reflux disease without esophagitis: Secondary | ICD-10-CM | POA: Diagnosis not present

## 2022-08-12 DIAGNOSIS — G20A1 Parkinson's disease without dyskinesia, without mention of fluctuations: Secondary | ICD-10-CM | POA: Diagnosis not present

## 2022-08-12 DIAGNOSIS — F0283 Dementia in other diseases classified elsewhere, unspecified severity, with mood disturbance: Secondary | ICD-10-CM | POA: Diagnosis not present

## 2022-08-12 DIAGNOSIS — E785 Hyperlipidemia, unspecified: Secondary | ICD-10-CM | POA: Diagnosis not present

## 2022-08-12 DIAGNOSIS — F05 Delirium due to known physiological condition: Secondary | ICD-10-CM | POA: Diagnosis not present

## 2022-08-12 DIAGNOSIS — F02818 Dementia in other diseases classified elsewhere, unspecified severity, with other behavioral disturbance: Secondary | ICD-10-CM | POA: Diagnosis not present

## 2022-08-12 DIAGNOSIS — F32A Depression, unspecified: Secondary | ICD-10-CM | POA: Diagnosis not present

## 2022-08-12 DIAGNOSIS — Z9181 History of falling: Secondary | ICD-10-CM | POA: Diagnosis not present

## 2022-08-12 DIAGNOSIS — I10 Essential (primary) hypertension: Secondary | ICD-10-CM | POA: Diagnosis not present

## 2022-08-16 DIAGNOSIS — I10 Essential (primary) hypertension: Secondary | ICD-10-CM | POA: Diagnosis not present

## 2022-08-16 DIAGNOSIS — F05 Delirium due to known physiological condition: Secondary | ICD-10-CM | POA: Diagnosis not present

## 2022-08-16 DIAGNOSIS — F02818 Dementia in other diseases classified elsewhere, unspecified severity, with other behavioral disturbance: Secondary | ICD-10-CM | POA: Diagnosis not present

## 2022-08-16 DIAGNOSIS — Z9181 History of falling: Secondary | ICD-10-CM | POA: Diagnosis not present

## 2022-08-16 DIAGNOSIS — F0283 Dementia in other diseases classified elsewhere, unspecified severity, with mood disturbance: Secondary | ICD-10-CM | POA: Diagnosis not present

## 2022-08-16 DIAGNOSIS — E785 Hyperlipidemia, unspecified: Secondary | ICD-10-CM | POA: Diagnosis not present

## 2022-08-16 DIAGNOSIS — F32A Depression, unspecified: Secondary | ICD-10-CM | POA: Diagnosis not present

## 2022-08-16 DIAGNOSIS — G20A1 Parkinson's disease without dyskinesia, without mention of fluctuations: Secondary | ICD-10-CM | POA: Diagnosis not present

## 2022-08-16 DIAGNOSIS — K219 Gastro-esophageal reflux disease without esophagitis: Secondary | ICD-10-CM | POA: Diagnosis not present

## 2022-08-17 DIAGNOSIS — F0390 Unspecified dementia without behavioral disturbance: Secondary | ICD-10-CM | POA: Diagnosis not present

## 2022-08-17 DIAGNOSIS — Z7189 Other specified counseling: Secondary | ICD-10-CM | POA: Diagnosis not present

## 2022-08-17 DIAGNOSIS — I5040 Unspecified combined systolic (congestive) and diastolic (congestive) heart failure: Secondary | ICD-10-CM | POA: Diagnosis not present

## 2022-08-17 DIAGNOSIS — F1320 Sedative, hypnotic or anxiolytic dependence, uncomplicated: Secondary | ICD-10-CM | POA: Diagnosis not present

## 2022-08-18 DIAGNOSIS — K219 Gastro-esophageal reflux disease without esophagitis: Secondary | ICD-10-CM | POA: Diagnosis not present

## 2022-08-18 DIAGNOSIS — F05 Delirium due to known physiological condition: Secondary | ICD-10-CM | POA: Diagnosis not present

## 2022-08-18 DIAGNOSIS — G20A1 Parkinson's disease without dyskinesia, without mention of fluctuations: Secondary | ICD-10-CM | POA: Diagnosis not present

## 2022-08-18 DIAGNOSIS — F02818 Dementia in other diseases classified elsewhere, unspecified severity, with other behavioral disturbance: Secondary | ICD-10-CM | POA: Diagnosis not present

## 2022-08-18 DIAGNOSIS — I10 Essential (primary) hypertension: Secondary | ICD-10-CM | POA: Diagnosis not present

## 2022-08-18 DIAGNOSIS — E785 Hyperlipidemia, unspecified: Secondary | ICD-10-CM | POA: Diagnosis not present

## 2022-08-18 DIAGNOSIS — F32A Depression, unspecified: Secondary | ICD-10-CM | POA: Diagnosis not present

## 2022-08-18 DIAGNOSIS — Z9181 History of falling: Secondary | ICD-10-CM | POA: Diagnosis not present

## 2022-08-18 DIAGNOSIS — F0283 Dementia in other diseases classified elsewhere, unspecified severity, with mood disturbance: Secondary | ICD-10-CM | POA: Diagnosis not present

## 2022-08-19 DIAGNOSIS — I10 Essential (primary) hypertension: Secondary | ICD-10-CM | POA: Diagnosis not present

## 2022-08-19 DIAGNOSIS — E559 Vitamin D deficiency, unspecified: Secondary | ICD-10-CM | POA: Diagnosis not present

## 2022-08-20 ENCOUNTER — Telehealth: Payer: Self-pay | Admitting: Family Medicine

## 2022-08-20 NOTE — Telephone Encounter (Signed)
Copied from [place] [id number]. Topic: Medicare AWV >> Aug 20, 2022  1:58 PM Devoria Glassing wrote: Reason for CRM: Left message for patient to schedule Annual Wellness Visit(AWV).  Please schedule with Health Nurse Advisor at [facility]. Please call [phone] ask for [facility].

## 2022-08-23 DIAGNOSIS — F05 Delirium due to known physiological condition: Secondary | ICD-10-CM | POA: Diagnosis not present

## 2022-08-23 DIAGNOSIS — I10 Essential (primary) hypertension: Secondary | ICD-10-CM | POA: Diagnosis not present

## 2022-08-23 DIAGNOSIS — F0283 Dementia in other diseases classified elsewhere, unspecified severity, with mood disturbance: Secondary | ICD-10-CM | POA: Diagnosis not present

## 2022-08-23 DIAGNOSIS — K219 Gastro-esophageal reflux disease without esophagitis: Secondary | ICD-10-CM | POA: Diagnosis not present

## 2022-08-23 DIAGNOSIS — F02818 Dementia in other diseases classified elsewhere, unspecified severity, with other behavioral disturbance: Secondary | ICD-10-CM | POA: Diagnosis not present

## 2022-08-23 DIAGNOSIS — E785 Hyperlipidemia, unspecified: Secondary | ICD-10-CM | POA: Diagnosis not present

## 2022-08-23 DIAGNOSIS — G20A1 Parkinson's disease without dyskinesia, without mention of fluctuations: Secondary | ICD-10-CM | POA: Diagnosis not present

## 2022-08-23 DIAGNOSIS — F32A Depression, unspecified: Secondary | ICD-10-CM | POA: Diagnosis not present

## 2022-08-23 DIAGNOSIS — Z9181 History of falling: Secondary | ICD-10-CM | POA: Diagnosis not present

## 2022-08-24 DIAGNOSIS — G20A1 Parkinson's disease without dyskinesia, without mention of fluctuations: Secondary | ICD-10-CM | POA: Diagnosis not present

## 2022-08-24 DIAGNOSIS — F801 Expressive language disorder: Secondary | ICD-10-CM | POA: Diagnosis not present

## 2022-08-24 DIAGNOSIS — F32A Depression, unspecified: Secondary | ICD-10-CM | POA: Diagnosis not present

## 2022-08-24 DIAGNOSIS — I150 Renovascular hypertension: Secondary | ICD-10-CM | POA: Diagnosis not present

## 2022-08-25 DIAGNOSIS — E785 Hyperlipidemia, unspecified: Secondary | ICD-10-CM | POA: Diagnosis not present

## 2022-08-25 DIAGNOSIS — F0283 Dementia in other diseases classified elsewhere, unspecified severity, with mood disturbance: Secondary | ICD-10-CM | POA: Diagnosis not present

## 2022-08-25 DIAGNOSIS — F05 Delirium due to known physiological condition: Secondary | ICD-10-CM | POA: Diagnosis not present

## 2022-08-25 DIAGNOSIS — G20A1 Parkinson's disease without dyskinesia, without mention of fluctuations: Secondary | ICD-10-CM | POA: Diagnosis not present

## 2022-08-25 DIAGNOSIS — I10 Essential (primary) hypertension: Secondary | ICD-10-CM | POA: Diagnosis not present

## 2022-08-25 DIAGNOSIS — F02818 Dementia in other diseases classified elsewhere, unspecified severity, with other behavioral disturbance: Secondary | ICD-10-CM | POA: Diagnosis not present

## 2022-08-25 DIAGNOSIS — K219 Gastro-esophageal reflux disease without esophagitis: Secondary | ICD-10-CM | POA: Diagnosis not present

## 2022-08-25 DIAGNOSIS — Z9181 History of falling: Secondary | ICD-10-CM | POA: Diagnosis not present

## 2022-08-25 DIAGNOSIS — F03C4 Unspecified dementia, severe, with anxiety: Secondary | ICD-10-CM | POA: Diagnosis not present

## 2022-08-25 DIAGNOSIS — F411 Generalized anxiety disorder: Secondary | ICD-10-CM | POA: Diagnosis not present

## 2022-08-25 DIAGNOSIS — F330 Major depressive disorder, recurrent, mild: Secondary | ICD-10-CM | POA: Diagnosis not present

## 2022-08-25 DIAGNOSIS — F32A Depression, unspecified: Secondary | ICD-10-CM | POA: Diagnosis not present

## 2022-08-26 DIAGNOSIS — R4182 Altered mental status, unspecified: Secondary | ICD-10-CM | POA: Diagnosis not present

## 2022-08-26 DIAGNOSIS — I5032 Chronic diastolic (congestive) heart failure: Secondary | ICD-10-CM | POA: Diagnosis not present

## 2022-08-26 DIAGNOSIS — N170 Acute kidney failure with tubular necrosis: Secondary | ICD-10-CM | POA: Diagnosis not present

## 2022-08-26 DIAGNOSIS — S72002D Fracture of unspecified part of neck of left femur, subsequent encounter for closed fracture with routine healing: Secondary | ICD-10-CM | POA: Diagnosis not present

## 2022-08-30 DIAGNOSIS — I10 Essential (primary) hypertension: Secondary | ICD-10-CM | POA: Diagnosis not present

## 2022-08-30 DIAGNOSIS — F32A Depression, unspecified: Secondary | ICD-10-CM | POA: Diagnosis not present

## 2022-08-30 DIAGNOSIS — K219 Gastro-esophageal reflux disease without esophagitis: Secondary | ICD-10-CM | POA: Diagnosis not present

## 2022-08-30 DIAGNOSIS — F05 Delirium due to known physiological condition: Secondary | ICD-10-CM | POA: Diagnosis not present

## 2022-08-30 DIAGNOSIS — F0283 Dementia in other diseases classified elsewhere, unspecified severity, with mood disturbance: Secondary | ICD-10-CM | POA: Diagnosis not present

## 2022-08-30 DIAGNOSIS — Z9181 History of falling: Secondary | ICD-10-CM | POA: Diagnosis not present

## 2022-08-30 DIAGNOSIS — F02818 Dementia in other diseases classified elsewhere, unspecified severity, with other behavioral disturbance: Secondary | ICD-10-CM | POA: Diagnosis not present

## 2022-08-30 DIAGNOSIS — E785 Hyperlipidemia, unspecified: Secondary | ICD-10-CM | POA: Diagnosis not present

## 2022-08-30 DIAGNOSIS — G20A1 Parkinson's disease without dyskinesia, without mention of fluctuations: Secondary | ICD-10-CM | POA: Diagnosis not present

## 2022-08-31 DIAGNOSIS — F02818 Dementia in other diseases classified elsewhere, unspecified severity, with other behavioral disturbance: Secondary | ICD-10-CM | POA: Diagnosis not present

## 2022-08-31 DIAGNOSIS — I150 Renovascular hypertension: Secondary | ICD-10-CM | POA: Diagnosis not present

## 2022-08-31 DIAGNOSIS — Z9181 History of falling: Secondary | ICD-10-CM | POA: Diagnosis not present

## 2022-08-31 DIAGNOSIS — F32A Depression, unspecified: Secondary | ICD-10-CM | POA: Diagnosis not present

## 2022-08-31 DIAGNOSIS — E785 Hyperlipidemia, unspecified: Secondary | ICD-10-CM | POA: Diagnosis not present

## 2022-08-31 DIAGNOSIS — D631 Anemia in chronic kidney disease: Secondary | ICD-10-CM | POA: Diagnosis not present

## 2022-08-31 DIAGNOSIS — F05 Delirium due to known physiological condition: Secondary | ICD-10-CM | POA: Diagnosis not present

## 2022-08-31 DIAGNOSIS — I10 Essential (primary) hypertension: Secondary | ICD-10-CM | POA: Diagnosis not present

## 2022-08-31 DIAGNOSIS — G20A1 Parkinson's disease without dyskinesia, without mention of fluctuations: Secondary | ICD-10-CM | POA: Diagnosis not present

## 2022-08-31 DIAGNOSIS — F0283 Dementia in other diseases classified elsewhere, unspecified severity, with mood disturbance: Secondary | ICD-10-CM | POA: Diagnosis not present

## 2022-08-31 DIAGNOSIS — I5040 Unspecified combined systolic (congestive) and diastolic (congestive) heart failure: Secondary | ICD-10-CM | POA: Diagnosis not present

## 2022-08-31 DIAGNOSIS — N182 Chronic kidney disease, stage 2 (mild): Secondary | ICD-10-CM | POA: Diagnosis not present

## 2022-08-31 DIAGNOSIS — K219 Gastro-esophageal reflux disease without esophagitis: Secondary | ICD-10-CM | POA: Diagnosis not present

## 2022-09-03 DIAGNOSIS — F0283 Dementia in other diseases classified elsewhere, unspecified severity, with mood disturbance: Secondary | ICD-10-CM | POA: Diagnosis not present

## 2022-09-03 DIAGNOSIS — G20A1 Parkinson's disease without dyskinesia, without mention of fluctuations: Secondary | ICD-10-CM | POA: Diagnosis not present

## 2022-09-03 DIAGNOSIS — Z9181 History of falling: Secondary | ICD-10-CM | POA: Diagnosis not present

## 2022-09-03 DIAGNOSIS — F05 Delirium due to known physiological condition: Secondary | ICD-10-CM | POA: Diagnosis not present

## 2022-09-03 DIAGNOSIS — F02818 Dementia in other diseases classified elsewhere, unspecified severity, with other behavioral disturbance: Secondary | ICD-10-CM | POA: Diagnosis not present

## 2022-09-03 DIAGNOSIS — F32A Depression, unspecified: Secondary | ICD-10-CM | POA: Diagnosis not present

## 2022-09-03 DIAGNOSIS — E785 Hyperlipidemia, unspecified: Secondary | ICD-10-CM | POA: Diagnosis not present

## 2022-09-03 DIAGNOSIS — K219 Gastro-esophageal reflux disease without esophagitis: Secondary | ICD-10-CM | POA: Diagnosis not present

## 2022-09-03 DIAGNOSIS — I10 Essential (primary) hypertension: Secondary | ICD-10-CM | POA: Diagnosis not present

## 2022-09-06 DIAGNOSIS — K219 Gastro-esophageal reflux disease without esophagitis: Secondary | ICD-10-CM | POA: Diagnosis not present

## 2022-09-06 DIAGNOSIS — F02818 Dementia in other diseases classified elsewhere, unspecified severity, with other behavioral disturbance: Secondary | ICD-10-CM | POA: Diagnosis not present

## 2022-09-06 DIAGNOSIS — I10 Essential (primary) hypertension: Secondary | ICD-10-CM | POA: Diagnosis not present

## 2022-09-06 DIAGNOSIS — Z9181 History of falling: Secondary | ICD-10-CM | POA: Diagnosis not present

## 2022-09-06 DIAGNOSIS — E785 Hyperlipidemia, unspecified: Secondary | ICD-10-CM | POA: Diagnosis not present

## 2022-09-06 DIAGNOSIS — F32A Depression, unspecified: Secondary | ICD-10-CM | POA: Diagnosis not present

## 2022-09-06 DIAGNOSIS — F0283 Dementia in other diseases classified elsewhere, unspecified severity, with mood disturbance: Secondary | ICD-10-CM | POA: Diagnosis not present

## 2022-09-06 DIAGNOSIS — F05 Delirium due to known physiological condition: Secondary | ICD-10-CM | POA: Diagnosis not present

## 2022-09-06 DIAGNOSIS — G20A1 Parkinson's disease without dyskinesia, without mention of fluctuations: Secondary | ICD-10-CM | POA: Diagnosis not present

## 2022-09-09 DIAGNOSIS — G20A1 Parkinson's disease without dyskinesia, without mention of fluctuations: Secondary | ICD-10-CM | POA: Diagnosis not present

## 2022-09-09 DIAGNOSIS — Z9181 History of falling: Secondary | ICD-10-CM | POA: Diagnosis not present

## 2022-09-09 DIAGNOSIS — E785 Hyperlipidemia, unspecified: Secondary | ICD-10-CM | POA: Diagnosis not present

## 2022-09-09 DIAGNOSIS — K219 Gastro-esophageal reflux disease without esophagitis: Secondary | ICD-10-CM | POA: Diagnosis not present

## 2022-09-09 DIAGNOSIS — I10 Essential (primary) hypertension: Secondary | ICD-10-CM | POA: Diagnosis not present

## 2022-09-09 DIAGNOSIS — F0283 Dementia in other diseases classified elsewhere, unspecified severity, with mood disturbance: Secondary | ICD-10-CM | POA: Diagnosis not present

## 2022-09-09 DIAGNOSIS — F02818 Dementia in other diseases classified elsewhere, unspecified severity, with other behavioral disturbance: Secondary | ICD-10-CM | POA: Diagnosis not present

## 2022-09-09 DIAGNOSIS — F32A Depression, unspecified: Secondary | ICD-10-CM | POA: Diagnosis not present

## 2022-09-09 DIAGNOSIS — F05 Delirium due to known physiological condition: Secondary | ICD-10-CM | POA: Diagnosis not present

## 2022-09-14 DIAGNOSIS — G20A1 Parkinson's disease without dyskinesia, without mention of fluctuations: Secondary | ICD-10-CM | POA: Diagnosis not present

## 2022-09-14 DIAGNOSIS — Z9181 History of falling: Secondary | ICD-10-CM | POA: Diagnosis not present

## 2022-09-14 DIAGNOSIS — E785 Hyperlipidemia, unspecified: Secondary | ICD-10-CM | POA: Diagnosis not present

## 2022-09-14 DIAGNOSIS — F05 Delirium due to known physiological condition: Secondary | ICD-10-CM | POA: Diagnosis not present

## 2022-09-14 DIAGNOSIS — I10 Essential (primary) hypertension: Secondary | ICD-10-CM | POA: Diagnosis not present

## 2022-09-14 DIAGNOSIS — F0283 Dementia in other diseases classified elsewhere, unspecified severity, with mood disturbance: Secondary | ICD-10-CM | POA: Diagnosis not present

## 2022-09-14 DIAGNOSIS — F32A Depression, unspecified: Secondary | ICD-10-CM | POA: Diagnosis not present

## 2022-09-14 DIAGNOSIS — F02818 Dementia in other diseases classified elsewhere, unspecified severity, with other behavioral disturbance: Secondary | ICD-10-CM | POA: Diagnosis not present

## 2022-09-14 DIAGNOSIS — K219 Gastro-esophageal reflux disease without esophagitis: Secondary | ICD-10-CM | POA: Diagnosis not present

## 2022-09-17 DIAGNOSIS — F02818 Dementia in other diseases classified elsewhere, unspecified severity, with other behavioral disturbance: Secondary | ICD-10-CM | POA: Diagnosis not present

## 2022-09-17 DIAGNOSIS — E785 Hyperlipidemia, unspecified: Secondary | ICD-10-CM | POA: Diagnosis not present

## 2022-09-17 DIAGNOSIS — Z9181 History of falling: Secondary | ICD-10-CM | POA: Diagnosis not present

## 2022-09-17 DIAGNOSIS — F32A Depression, unspecified: Secondary | ICD-10-CM | POA: Diagnosis not present

## 2022-09-17 DIAGNOSIS — G20A1 Parkinson's disease without dyskinesia, without mention of fluctuations: Secondary | ICD-10-CM | POA: Diagnosis not present

## 2022-09-17 DIAGNOSIS — K219 Gastro-esophageal reflux disease without esophagitis: Secondary | ICD-10-CM | POA: Diagnosis not present

## 2022-09-17 DIAGNOSIS — I10 Essential (primary) hypertension: Secondary | ICD-10-CM | POA: Diagnosis not present

## 2022-09-17 DIAGNOSIS — F0283 Dementia in other diseases classified elsewhere, unspecified severity, with mood disturbance: Secondary | ICD-10-CM | POA: Diagnosis not present

## 2022-09-17 DIAGNOSIS — F05 Delirium due to known physiological condition: Secondary | ICD-10-CM | POA: Diagnosis not present

## 2022-09-22 DIAGNOSIS — F03C4 Unspecified dementia, severe, with anxiety: Secondary | ICD-10-CM | POA: Diagnosis not present

## 2022-09-22 DIAGNOSIS — F411 Generalized anxiety disorder: Secondary | ICD-10-CM | POA: Diagnosis not present

## 2022-09-22 DIAGNOSIS — F330 Major depressive disorder, recurrent, mild: Secondary | ICD-10-CM | POA: Diagnosis not present

## 2022-09-28 DIAGNOSIS — F0280 Dementia in other diseases classified elsewhere without behavioral disturbance: Secondary | ICD-10-CM | POA: Diagnosis not present

## 2022-09-28 DIAGNOSIS — G20A1 Parkinson's disease without dyskinesia, without mention of fluctuations: Secondary | ICD-10-CM | POA: Diagnosis not present

## 2022-09-28 DIAGNOSIS — F1320 Sedative, hypnotic or anxiolytic dependence, uncomplicated: Secondary | ICD-10-CM | POA: Diagnosis not present

## 2022-09-28 DIAGNOSIS — E785 Hyperlipidemia, unspecified: Secondary | ICD-10-CM | POA: Diagnosis not present

## 2022-10-15 DIAGNOSIS — F0280 Dementia in other diseases classified elsewhere without behavioral disturbance: Secondary | ICD-10-CM | POA: Diagnosis not present

## 2022-10-15 DIAGNOSIS — N182 Chronic kidney disease, stage 2 (mild): Secondary | ICD-10-CM | POA: Diagnosis not present

## 2022-10-15 DIAGNOSIS — I509 Heart failure, unspecified: Secondary | ICD-10-CM | POA: Diagnosis not present

## 2022-10-15 DIAGNOSIS — G20A1 Parkinson's disease without dyskinesia, without mention of fluctuations: Secondary | ICD-10-CM | POA: Diagnosis not present

## 2022-10-15 DIAGNOSIS — F32A Depression, unspecified: Secondary | ICD-10-CM | POA: Diagnosis not present

## 2022-10-15 DIAGNOSIS — S51802D Unspecified open wound of left forearm, subsequent encounter: Secondary | ICD-10-CM | POA: Diagnosis not present

## 2022-10-15 DIAGNOSIS — I714 Abdominal aortic aneurysm, without rupture, unspecified: Secondary | ICD-10-CM | POA: Diagnosis not present

## 2022-10-15 DIAGNOSIS — E785 Hyperlipidemia, unspecified: Secondary | ICD-10-CM | POA: Diagnosis not present

## 2022-10-15 DIAGNOSIS — I130 Hypertensive heart and chronic kidney disease with heart failure and stage 1 through stage 4 chronic kidney disease, or unspecified chronic kidney disease: Secondary | ICD-10-CM | POA: Diagnosis not present

## 2022-10-15 DIAGNOSIS — Z9181 History of falling: Secondary | ICD-10-CM | POA: Diagnosis not present

## 2022-10-19 DIAGNOSIS — I714 Abdominal aortic aneurysm, without rupture, unspecified: Secondary | ICD-10-CM | POA: Diagnosis not present

## 2022-10-19 DIAGNOSIS — G20A1 Parkinson's disease without dyskinesia, without mention of fluctuations: Secondary | ICD-10-CM | POA: Diagnosis not present

## 2022-10-19 DIAGNOSIS — I130 Hypertensive heart and chronic kidney disease with heart failure and stage 1 through stage 4 chronic kidney disease, or unspecified chronic kidney disease: Secondary | ICD-10-CM | POA: Diagnosis not present

## 2022-10-19 DIAGNOSIS — S51802D Unspecified open wound of left forearm, subsequent encounter: Secondary | ICD-10-CM | POA: Diagnosis not present

## 2022-10-19 DIAGNOSIS — E785 Hyperlipidemia, unspecified: Secondary | ICD-10-CM | POA: Diagnosis not present

## 2022-10-19 DIAGNOSIS — F0280 Dementia in other diseases classified elsewhere without behavioral disturbance: Secondary | ICD-10-CM | POA: Diagnosis not present

## 2022-10-19 DIAGNOSIS — F32A Depression, unspecified: Secondary | ICD-10-CM | POA: Diagnosis not present

## 2022-10-19 DIAGNOSIS — Z9181 History of falling: Secondary | ICD-10-CM | POA: Diagnosis not present

## 2022-10-19 DIAGNOSIS — N182 Chronic kidney disease, stage 2 (mild): Secondary | ICD-10-CM | POA: Diagnosis not present

## 2022-10-19 DIAGNOSIS — I509 Heart failure, unspecified: Secondary | ICD-10-CM | POA: Diagnosis not present

## 2022-10-22 DIAGNOSIS — G20A1 Parkinson's disease without dyskinesia, without mention of fluctuations: Secondary | ICD-10-CM | POA: Diagnosis not present

## 2022-10-22 DIAGNOSIS — F0280 Dementia in other diseases classified elsewhere without behavioral disturbance: Secondary | ICD-10-CM | POA: Diagnosis not present

## 2022-10-22 DIAGNOSIS — I509 Heart failure, unspecified: Secondary | ICD-10-CM | POA: Diagnosis not present

## 2022-10-22 DIAGNOSIS — S51802D Unspecified open wound of left forearm, subsequent encounter: Secondary | ICD-10-CM | POA: Diagnosis not present

## 2022-10-22 DIAGNOSIS — E785 Hyperlipidemia, unspecified: Secondary | ICD-10-CM | POA: Diagnosis not present

## 2022-10-22 DIAGNOSIS — N182 Chronic kidney disease, stage 2 (mild): Secondary | ICD-10-CM | POA: Diagnosis not present

## 2022-10-22 DIAGNOSIS — F32A Depression, unspecified: Secondary | ICD-10-CM | POA: Diagnosis not present

## 2022-10-22 DIAGNOSIS — I714 Abdominal aortic aneurysm, without rupture, unspecified: Secondary | ICD-10-CM | POA: Diagnosis not present

## 2022-10-22 DIAGNOSIS — Z9181 History of falling: Secondary | ICD-10-CM | POA: Diagnosis not present

## 2022-10-22 DIAGNOSIS — I130 Hypertensive heart and chronic kidney disease with heart failure and stage 1 through stage 4 chronic kidney disease, or unspecified chronic kidney disease: Secondary | ICD-10-CM | POA: Diagnosis not present

## 2022-10-26 DIAGNOSIS — R54 Age-related physical debility: Secondary | ICD-10-CM | POA: Diagnosis not present

## 2022-10-26 DIAGNOSIS — Z993 Dependence on wheelchair: Secondary | ICD-10-CM | POA: Diagnosis not present

## 2022-10-26 DIAGNOSIS — K5909 Other constipation: Secondary | ICD-10-CM | POA: Diagnosis not present

## 2022-10-26 DIAGNOSIS — S51812D Laceration without foreign body of left forearm, subsequent encounter: Secondary | ICD-10-CM | POA: Diagnosis not present

## 2022-10-27 DIAGNOSIS — E785 Hyperlipidemia, unspecified: Secondary | ICD-10-CM | POA: Diagnosis not present

## 2022-10-27 DIAGNOSIS — G20A1 Parkinson's disease without dyskinesia, without mention of fluctuations: Secondary | ICD-10-CM | POA: Diagnosis not present

## 2022-10-27 DIAGNOSIS — Z9181 History of falling: Secondary | ICD-10-CM | POA: Diagnosis not present

## 2022-10-27 DIAGNOSIS — I714 Abdominal aortic aneurysm, without rupture, unspecified: Secondary | ICD-10-CM | POA: Diagnosis not present

## 2022-10-27 DIAGNOSIS — N182 Chronic kidney disease, stage 2 (mild): Secondary | ICD-10-CM | POA: Diagnosis not present

## 2022-10-27 DIAGNOSIS — I509 Heart failure, unspecified: Secondary | ICD-10-CM | POA: Diagnosis not present

## 2022-10-27 DIAGNOSIS — I130 Hypertensive heart and chronic kidney disease with heart failure and stage 1 through stage 4 chronic kidney disease, or unspecified chronic kidney disease: Secondary | ICD-10-CM | POA: Diagnosis not present

## 2022-10-27 DIAGNOSIS — F32A Depression, unspecified: Secondary | ICD-10-CM | POA: Diagnosis not present

## 2022-10-27 DIAGNOSIS — S51802D Unspecified open wound of left forearm, subsequent encounter: Secondary | ICD-10-CM | POA: Diagnosis not present

## 2022-10-27 DIAGNOSIS — F0280 Dementia in other diseases classified elsewhere without behavioral disturbance: Secondary | ICD-10-CM | POA: Diagnosis not present

## 2022-11-02 DIAGNOSIS — F0280 Dementia in other diseases classified elsewhere without behavioral disturbance: Secondary | ICD-10-CM | POA: Diagnosis not present

## 2022-11-02 DIAGNOSIS — I509 Heart failure, unspecified: Secondary | ICD-10-CM | POA: Diagnosis not present

## 2022-11-02 DIAGNOSIS — I130 Hypertensive heart and chronic kidney disease with heart failure and stage 1 through stage 4 chronic kidney disease, or unspecified chronic kidney disease: Secondary | ICD-10-CM | POA: Diagnosis not present

## 2022-11-02 DIAGNOSIS — F32A Depression, unspecified: Secondary | ICD-10-CM | POA: Diagnosis not present

## 2022-11-02 DIAGNOSIS — I714 Abdominal aortic aneurysm, without rupture, unspecified: Secondary | ICD-10-CM | POA: Diagnosis not present

## 2022-11-02 DIAGNOSIS — N182 Chronic kidney disease, stage 2 (mild): Secondary | ICD-10-CM | POA: Diagnosis not present

## 2022-11-02 DIAGNOSIS — E785 Hyperlipidemia, unspecified: Secondary | ICD-10-CM | POA: Diagnosis not present

## 2022-11-02 DIAGNOSIS — G20A1 Parkinson's disease without dyskinesia, without mention of fluctuations: Secondary | ICD-10-CM | POA: Diagnosis not present

## 2022-11-02 DIAGNOSIS — Z9181 History of falling: Secondary | ICD-10-CM | POA: Diagnosis not present

## 2022-11-02 DIAGNOSIS — S51802D Unspecified open wound of left forearm, subsequent encounter: Secondary | ICD-10-CM | POA: Diagnosis not present

## 2022-11-03 DIAGNOSIS — G20A1 Parkinson's disease without dyskinesia, without mention of fluctuations: Secondary | ICD-10-CM | POA: Diagnosis not present

## 2022-11-03 DIAGNOSIS — N182 Chronic kidney disease, stage 2 (mild): Secondary | ICD-10-CM | POA: Diagnosis not present

## 2022-11-03 DIAGNOSIS — F32A Depression, unspecified: Secondary | ICD-10-CM | POA: Diagnosis not present

## 2022-11-03 DIAGNOSIS — I714 Abdominal aortic aneurysm, without rupture, unspecified: Secondary | ICD-10-CM | POA: Diagnosis not present

## 2022-11-03 DIAGNOSIS — S51802D Unspecified open wound of left forearm, subsequent encounter: Secondary | ICD-10-CM | POA: Diagnosis not present

## 2022-11-03 DIAGNOSIS — Z9181 History of falling: Secondary | ICD-10-CM | POA: Diagnosis not present

## 2022-11-03 DIAGNOSIS — I130 Hypertensive heart and chronic kidney disease with heart failure and stage 1 through stage 4 chronic kidney disease, or unspecified chronic kidney disease: Secondary | ICD-10-CM | POA: Diagnosis not present

## 2022-11-03 DIAGNOSIS — E785 Hyperlipidemia, unspecified: Secondary | ICD-10-CM | POA: Diagnosis not present

## 2022-11-03 DIAGNOSIS — F0280 Dementia in other diseases classified elsewhere without behavioral disturbance: Secondary | ICD-10-CM | POA: Diagnosis not present

## 2022-11-03 DIAGNOSIS — I509 Heart failure, unspecified: Secondary | ICD-10-CM | POA: Diagnosis not present

## 2022-11-09 DIAGNOSIS — Z9181 History of falling: Secondary | ICD-10-CM | POA: Diagnosis not present

## 2022-11-09 DIAGNOSIS — F32A Depression, unspecified: Secondary | ICD-10-CM | POA: Diagnosis not present

## 2022-11-09 DIAGNOSIS — N182 Chronic kidney disease, stage 2 (mild): Secondary | ICD-10-CM | POA: Diagnosis not present

## 2022-11-09 DIAGNOSIS — E785 Hyperlipidemia, unspecified: Secondary | ICD-10-CM | POA: Diagnosis not present

## 2022-11-09 DIAGNOSIS — F0280 Dementia in other diseases classified elsewhere without behavioral disturbance: Secondary | ICD-10-CM | POA: Diagnosis not present

## 2022-11-09 DIAGNOSIS — I130 Hypertensive heart and chronic kidney disease with heart failure and stage 1 through stage 4 chronic kidney disease, or unspecified chronic kidney disease: Secondary | ICD-10-CM | POA: Diagnosis not present

## 2022-11-09 DIAGNOSIS — I509 Heart failure, unspecified: Secondary | ICD-10-CM | POA: Diagnosis not present

## 2022-11-09 DIAGNOSIS — I714 Abdominal aortic aneurysm, without rupture, unspecified: Secondary | ICD-10-CM | POA: Diagnosis not present

## 2022-11-09 DIAGNOSIS — G20A1 Parkinson's disease without dyskinesia, without mention of fluctuations: Secondary | ICD-10-CM | POA: Diagnosis not present

## 2022-11-09 DIAGNOSIS — S51802D Unspecified open wound of left forearm, subsequent encounter: Secondary | ICD-10-CM | POA: Diagnosis not present

## 2022-11-12 DIAGNOSIS — G20A1 Parkinson's disease without dyskinesia, without mention of fluctuations: Secondary | ICD-10-CM | POA: Diagnosis not present

## 2022-11-12 DIAGNOSIS — S51802D Unspecified open wound of left forearm, subsequent encounter: Secondary | ICD-10-CM | POA: Diagnosis not present

## 2022-11-12 DIAGNOSIS — Z9181 History of falling: Secondary | ICD-10-CM | POA: Diagnosis not present

## 2022-11-12 DIAGNOSIS — N182 Chronic kidney disease, stage 2 (mild): Secondary | ICD-10-CM | POA: Diagnosis not present

## 2022-11-12 DIAGNOSIS — I714 Abdominal aortic aneurysm, without rupture, unspecified: Secondary | ICD-10-CM | POA: Diagnosis not present

## 2022-11-12 DIAGNOSIS — F0280 Dementia in other diseases classified elsewhere without behavioral disturbance: Secondary | ICD-10-CM | POA: Diagnosis not present

## 2022-11-12 DIAGNOSIS — F32A Depression, unspecified: Secondary | ICD-10-CM | POA: Diagnosis not present

## 2022-11-12 DIAGNOSIS — I509 Heart failure, unspecified: Secondary | ICD-10-CM | POA: Diagnosis not present

## 2022-11-12 DIAGNOSIS — E785 Hyperlipidemia, unspecified: Secondary | ICD-10-CM | POA: Diagnosis not present

## 2022-11-12 DIAGNOSIS — I130 Hypertensive heart and chronic kidney disease with heart failure and stage 1 through stage 4 chronic kidney disease, or unspecified chronic kidney disease: Secondary | ICD-10-CM | POA: Diagnosis not present

## 2022-11-17 DIAGNOSIS — E785 Hyperlipidemia, unspecified: Secondary | ICD-10-CM | POA: Diagnosis not present

## 2022-11-17 DIAGNOSIS — F32A Depression, unspecified: Secondary | ICD-10-CM | POA: Diagnosis not present

## 2022-11-17 DIAGNOSIS — I509 Heart failure, unspecified: Secondary | ICD-10-CM | POA: Diagnosis not present

## 2022-11-17 DIAGNOSIS — G20A1 Parkinson's disease without dyskinesia, without mention of fluctuations: Secondary | ICD-10-CM | POA: Diagnosis not present

## 2022-11-17 DIAGNOSIS — F0280 Dementia in other diseases classified elsewhere without behavioral disturbance: Secondary | ICD-10-CM | POA: Diagnosis not present

## 2022-11-17 DIAGNOSIS — Z9181 History of falling: Secondary | ICD-10-CM | POA: Diagnosis not present

## 2022-11-17 DIAGNOSIS — I714 Abdominal aortic aneurysm, without rupture, unspecified: Secondary | ICD-10-CM | POA: Diagnosis not present

## 2022-11-17 DIAGNOSIS — S51802D Unspecified open wound of left forearm, subsequent encounter: Secondary | ICD-10-CM | POA: Diagnosis not present

## 2022-11-17 DIAGNOSIS — I130 Hypertensive heart and chronic kidney disease with heart failure and stage 1 through stage 4 chronic kidney disease, or unspecified chronic kidney disease: Secondary | ICD-10-CM | POA: Diagnosis not present

## 2022-11-17 DIAGNOSIS — N182 Chronic kidney disease, stage 2 (mild): Secondary | ICD-10-CM | POA: Diagnosis not present

## 2022-11-17 NOTE — Telephone Encounter (Signed)
Error

## 2022-11-19 DIAGNOSIS — N182 Chronic kidney disease, stage 2 (mild): Secondary | ICD-10-CM | POA: Diagnosis not present

## 2022-11-19 DIAGNOSIS — S51802D Unspecified open wound of left forearm, subsequent encounter: Secondary | ICD-10-CM | POA: Diagnosis not present

## 2022-11-19 DIAGNOSIS — F32A Depression, unspecified: Secondary | ICD-10-CM | POA: Diagnosis not present

## 2022-11-19 DIAGNOSIS — F0280 Dementia in other diseases classified elsewhere without behavioral disturbance: Secondary | ICD-10-CM | POA: Diagnosis not present

## 2022-11-19 DIAGNOSIS — I130 Hypertensive heart and chronic kidney disease with heart failure and stage 1 through stage 4 chronic kidney disease, or unspecified chronic kidney disease: Secondary | ICD-10-CM | POA: Diagnosis not present

## 2022-11-19 DIAGNOSIS — Z9181 History of falling: Secondary | ICD-10-CM | POA: Diagnosis not present

## 2022-11-19 DIAGNOSIS — G20A1 Parkinson's disease without dyskinesia, without mention of fluctuations: Secondary | ICD-10-CM | POA: Diagnosis not present

## 2022-11-19 DIAGNOSIS — I714 Abdominal aortic aneurysm, without rupture, unspecified: Secondary | ICD-10-CM | POA: Diagnosis not present

## 2022-11-19 DIAGNOSIS — E785 Hyperlipidemia, unspecified: Secondary | ICD-10-CM | POA: Diagnosis not present

## 2022-11-19 DIAGNOSIS — I509 Heart failure, unspecified: Secondary | ICD-10-CM | POA: Diagnosis not present

## 2022-11-23 DIAGNOSIS — F0280 Dementia in other diseases classified elsewhere without behavioral disturbance: Secondary | ICD-10-CM | POA: Diagnosis not present

## 2022-11-23 DIAGNOSIS — E785 Hyperlipidemia, unspecified: Secondary | ICD-10-CM | POA: Diagnosis not present

## 2022-11-23 DIAGNOSIS — I714 Abdominal aortic aneurysm, without rupture, unspecified: Secondary | ICD-10-CM | POA: Diagnosis not present

## 2022-11-23 DIAGNOSIS — S51802D Unspecified open wound of left forearm, subsequent encounter: Secondary | ICD-10-CM | POA: Diagnosis not present

## 2022-11-23 DIAGNOSIS — I509 Heart failure, unspecified: Secondary | ICD-10-CM | POA: Diagnosis not present

## 2022-11-23 DIAGNOSIS — I130 Hypertensive heart and chronic kidney disease with heart failure and stage 1 through stage 4 chronic kidney disease, or unspecified chronic kidney disease: Secondary | ICD-10-CM | POA: Diagnosis not present

## 2022-11-23 DIAGNOSIS — N182 Chronic kidney disease, stage 2 (mild): Secondary | ICD-10-CM | POA: Diagnosis not present

## 2022-11-23 DIAGNOSIS — G20A1 Parkinson's disease without dyskinesia, without mention of fluctuations: Secondary | ICD-10-CM | POA: Diagnosis not present

## 2022-11-23 DIAGNOSIS — Z9181 History of falling: Secondary | ICD-10-CM | POA: Diagnosis not present

## 2022-11-23 DIAGNOSIS — F32A Depression, unspecified: Secondary | ICD-10-CM | POA: Diagnosis not present

## 2022-11-25 DIAGNOSIS — I509 Heart failure, unspecified: Secondary | ICD-10-CM | POA: Diagnosis not present

## 2022-11-25 DIAGNOSIS — N182 Chronic kidney disease, stage 2 (mild): Secondary | ICD-10-CM | POA: Diagnosis not present

## 2022-11-25 DIAGNOSIS — F0280 Dementia in other diseases classified elsewhere without behavioral disturbance: Secondary | ICD-10-CM | POA: Diagnosis not present

## 2022-11-25 DIAGNOSIS — E785 Hyperlipidemia, unspecified: Secondary | ICD-10-CM | POA: Diagnosis not present

## 2022-11-25 DIAGNOSIS — F32A Depression, unspecified: Secondary | ICD-10-CM | POA: Diagnosis not present

## 2022-11-25 DIAGNOSIS — I130 Hypertensive heart and chronic kidney disease with heart failure and stage 1 through stage 4 chronic kidney disease, or unspecified chronic kidney disease: Secondary | ICD-10-CM | POA: Diagnosis not present

## 2022-11-25 DIAGNOSIS — G20A1 Parkinson's disease without dyskinesia, without mention of fluctuations: Secondary | ICD-10-CM | POA: Diagnosis not present

## 2022-11-25 DIAGNOSIS — S51802D Unspecified open wound of left forearm, subsequent encounter: Secondary | ICD-10-CM | POA: Diagnosis not present

## 2022-11-25 DIAGNOSIS — I714 Abdominal aortic aneurysm, without rupture, unspecified: Secondary | ICD-10-CM | POA: Diagnosis not present

## 2022-11-25 DIAGNOSIS — Z9181 History of falling: Secondary | ICD-10-CM | POA: Diagnosis not present

## 2022-12-01 DIAGNOSIS — E785 Hyperlipidemia, unspecified: Secondary | ICD-10-CM | POA: Diagnosis not present

## 2022-12-01 DIAGNOSIS — I130 Hypertensive heart and chronic kidney disease with heart failure and stage 1 through stage 4 chronic kidney disease, or unspecified chronic kidney disease: Secondary | ICD-10-CM | POA: Diagnosis not present

## 2022-12-01 DIAGNOSIS — Z9181 History of falling: Secondary | ICD-10-CM | POA: Diagnosis not present

## 2022-12-01 DIAGNOSIS — F0280 Dementia in other diseases classified elsewhere without behavioral disturbance: Secondary | ICD-10-CM | POA: Diagnosis not present

## 2022-12-01 DIAGNOSIS — G20A1 Parkinson's disease without dyskinesia, without mention of fluctuations: Secondary | ICD-10-CM | POA: Diagnosis not present

## 2022-12-01 DIAGNOSIS — I714 Abdominal aortic aneurysm, without rupture, unspecified: Secondary | ICD-10-CM | POA: Diagnosis not present

## 2022-12-01 DIAGNOSIS — F411 Generalized anxiety disorder: Secondary | ICD-10-CM | POA: Diagnosis not present

## 2022-12-01 DIAGNOSIS — F03C4 Unspecified dementia, severe, with anxiety: Secondary | ICD-10-CM | POA: Diagnosis not present

## 2022-12-01 DIAGNOSIS — F32A Depression, unspecified: Secondary | ICD-10-CM | POA: Diagnosis not present

## 2022-12-01 DIAGNOSIS — I509 Heart failure, unspecified: Secondary | ICD-10-CM | POA: Diagnosis not present

## 2022-12-01 DIAGNOSIS — S51802D Unspecified open wound of left forearm, subsequent encounter: Secondary | ICD-10-CM | POA: Diagnosis not present

## 2022-12-01 DIAGNOSIS — N182 Chronic kidney disease, stage 2 (mild): Secondary | ICD-10-CM | POA: Diagnosis not present

## 2022-12-01 DIAGNOSIS — F330 Major depressive disorder, recurrent, mild: Secondary | ICD-10-CM | POA: Diagnosis not present

## 2022-12-03 DIAGNOSIS — I509 Heart failure, unspecified: Secondary | ICD-10-CM | POA: Diagnosis not present

## 2022-12-03 DIAGNOSIS — F32A Depression, unspecified: Secondary | ICD-10-CM | POA: Diagnosis not present

## 2022-12-03 DIAGNOSIS — I130 Hypertensive heart and chronic kidney disease with heart failure and stage 1 through stage 4 chronic kidney disease, or unspecified chronic kidney disease: Secondary | ICD-10-CM | POA: Diagnosis not present

## 2022-12-03 DIAGNOSIS — S51802D Unspecified open wound of left forearm, subsequent encounter: Secondary | ICD-10-CM | POA: Diagnosis not present

## 2022-12-03 DIAGNOSIS — G20A1 Parkinson's disease without dyskinesia, without mention of fluctuations: Secondary | ICD-10-CM | POA: Diagnosis not present

## 2022-12-03 DIAGNOSIS — N182 Chronic kidney disease, stage 2 (mild): Secondary | ICD-10-CM | POA: Diagnosis not present

## 2022-12-03 DIAGNOSIS — I714 Abdominal aortic aneurysm, without rupture, unspecified: Secondary | ICD-10-CM | POA: Diagnosis not present

## 2022-12-03 DIAGNOSIS — F0280 Dementia in other diseases classified elsewhere without behavioral disturbance: Secondary | ICD-10-CM | POA: Diagnosis not present

## 2022-12-03 DIAGNOSIS — E785 Hyperlipidemia, unspecified: Secondary | ICD-10-CM | POA: Diagnosis not present

## 2022-12-03 DIAGNOSIS — Z9181 History of falling: Secondary | ICD-10-CM | POA: Diagnosis not present

## 2022-12-09 DIAGNOSIS — F32A Depression, unspecified: Secondary | ICD-10-CM | POA: Diagnosis not present

## 2022-12-09 DIAGNOSIS — E785 Hyperlipidemia, unspecified: Secondary | ICD-10-CM | POA: Diagnosis not present

## 2022-12-09 DIAGNOSIS — I130 Hypertensive heart and chronic kidney disease with heart failure and stage 1 through stage 4 chronic kidney disease, or unspecified chronic kidney disease: Secondary | ICD-10-CM | POA: Diagnosis not present

## 2022-12-09 DIAGNOSIS — G20A1 Parkinson's disease without dyskinesia, without mention of fluctuations: Secondary | ICD-10-CM | POA: Diagnosis not present

## 2022-12-09 DIAGNOSIS — S51802D Unspecified open wound of left forearm, subsequent encounter: Secondary | ICD-10-CM | POA: Diagnosis not present

## 2022-12-09 DIAGNOSIS — I509 Heart failure, unspecified: Secondary | ICD-10-CM | POA: Diagnosis not present

## 2022-12-09 DIAGNOSIS — I714 Abdominal aortic aneurysm, without rupture, unspecified: Secondary | ICD-10-CM | POA: Diagnosis not present

## 2022-12-09 DIAGNOSIS — N182 Chronic kidney disease, stage 2 (mild): Secondary | ICD-10-CM | POA: Diagnosis not present

## 2022-12-09 DIAGNOSIS — Z9181 History of falling: Secondary | ICD-10-CM | POA: Diagnosis not present

## 2022-12-09 DIAGNOSIS — F0280 Dementia in other diseases classified elsewhere without behavioral disturbance: Secondary | ICD-10-CM | POA: Diagnosis not present

## 2022-12-10 DIAGNOSIS — S51802D Unspecified open wound of left forearm, subsequent encounter: Secondary | ICD-10-CM | POA: Diagnosis not present

## 2022-12-10 DIAGNOSIS — Z9181 History of falling: Secondary | ICD-10-CM | POA: Diagnosis not present

## 2022-12-10 DIAGNOSIS — I509 Heart failure, unspecified: Secondary | ICD-10-CM | POA: Diagnosis not present

## 2022-12-10 DIAGNOSIS — G20A1 Parkinson's disease without dyskinesia, without mention of fluctuations: Secondary | ICD-10-CM | POA: Diagnosis not present

## 2022-12-10 DIAGNOSIS — E785 Hyperlipidemia, unspecified: Secondary | ICD-10-CM | POA: Diagnosis not present

## 2022-12-10 DIAGNOSIS — N182 Chronic kidney disease, stage 2 (mild): Secondary | ICD-10-CM | POA: Diagnosis not present

## 2022-12-10 DIAGNOSIS — I714 Abdominal aortic aneurysm, without rupture, unspecified: Secondary | ICD-10-CM | POA: Diagnosis not present

## 2022-12-10 DIAGNOSIS — F0280 Dementia in other diseases classified elsewhere without behavioral disturbance: Secondary | ICD-10-CM | POA: Diagnosis not present

## 2022-12-10 DIAGNOSIS — F32A Depression, unspecified: Secondary | ICD-10-CM | POA: Diagnosis not present

## 2022-12-10 DIAGNOSIS — I130 Hypertensive heart and chronic kidney disease with heart failure and stage 1 through stage 4 chronic kidney disease, or unspecified chronic kidney disease: Secondary | ICD-10-CM | POA: Diagnosis not present

## 2022-12-15 DIAGNOSIS — I714 Abdominal aortic aneurysm, without rupture, unspecified: Secondary | ICD-10-CM | POA: Diagnosis not present

## 2022-12-15 DIAGNOSIS — Z9181 History of falling: Secondary | ICD-10-CM | POA: Diagnosis not present

## 2022-12-15 DIAGNOSIS — I351 Nonrheumatic aortic (valve) insufficiency: Secondary | ICD-10-CM | POA: Diagnosis not present

## 2022-12-15 DIAGNOSIS — E785 Hyperlipidemia, unspecified: Secondary | ICD-10-CM | POA: Diagnosis not present

## 2022-12-15 DIAGNOSIS — F02A3 Dementia in other diseases classified elsewhere, mild, with mood disturbance: Secondary | ICD-10-CM | POA: Diagnosis not present

## 2022-12-15 DIAGNOSIS — G20A1 Parkinson's disease without dyskinesia, without mention of fluctuations: Secondary | ICD-10-CM | POA: Diagnosis not present

## 2022-12-15 DIAGNOSIS — F02A4 Dementia in other diseases classified elsewhere, mild, with anxiety: Secondary | ICD-10-CM | POA: Diagnosis not present

## 2022-12-15 DIAGNOSIS — K5909 Other constipation: Secondary | ICD-10-CM | POA: Diagnosis not present

## 2022-12-15 DIAGNOSIS — N182 Chronic kidney disease, stage 2 (mild): Secondary | ICD-10-CM | POA: Diagnosis not present

## 2022-12-15 DIAGNOSIS — F32A Depression, unspecified: Secondary | ICD-10-CM | POA: Diagnosis not present

## 2022-12-15 DIAGNOSIS — I130 Hypertensive heart and chronic kidney disease with heart failure and stage 1 through stage 4 chronic kidney disease, or unspecified chronic kidney disease: Secondary | ICD-10-CM | POA: Diagnosis not present

## 2022-12-15 DIAGNOSIS — I5042 Chronic combined systolic (congestive) and diastolic (congestive) heart failure: Secondary | ICD-10-CM | POA: Diagnosis not present

## 2022-12-15 DIAGNOSIS — I6529 Occlusion and stenosis of unspecified carotid artery: Secondary | ICD-10-CM | POA: Diagnosis not present

## 2022-12-17 DIAGNOSIS — Z9181 History of falling: Secondary | ICD-10-CM | POA: Diagnosis not present

## 2022-12-17 DIAGNOSIS — I130 Hypertensive heart and chronic kidney disease with heart failure and stage 1 through stage 4 chronic kidney disease, or unspecified chronic kidney disease: Secondary | ICD-10-CM | POA: Diagnosis not present

## 2022-12-17 DIAGNOSIS — N182 Chronic kidney disease, stage 2 (mild): Secondary | ICD-10-CM | POA: Diagnosis not present

## 2022-12-17 DIAGNOSIS — F02A4 Dementia in other diseases classified elsewhere, mild, with anxiety: Secondary | ICD-10-CM | POA: Diagnosis not present

## 2022-12-17 DIAGNOSIS — I714 Abdominal aortic aneurysm, without rupture, unspecified: Secondary | ICD-10-CM | POA: Diagnosis not present

## 2022-12-17 DIAGNOSIS — F02A3 Dementia in other diseases classified elsewhere, mild, with mood disturbance: Secondary | ICD-10-CM | POA: Diagnosis not present

## 2022-12-17 DIAGNOSIS — I5042 Chronic combined systolic (congestive) and diastolic (congestive) heart failure: Secondary | ICD-10-CM | POA: Diagnosis not present

## 2022-12-17 DIAGNOSIS — E785 Hyperlipidemia, unspecified: Secondary | ICD-10-CM | POA: Diagnosis not present

## 2022-12-17 DIAGNOSIS — G20A1 Parkinson's disease without dyskinesia, without mention of fluctuations: Secondary | ICD-10-CM | POA: Diagnosis not present

## 2022-12-17 DIAGNOSIS — F32A Depression, unspecified: Secondary | ICD-10-CM | POA: Diagnosis not present

## 2022-12-17 DIAGNOSIS — K5909 Other constipation: Secondary | ICD-10-CM | POA: Diagnosis not present

## 2022-12-17 DIAGNOSIS — I351 Nonrheumatic aortic (valve) insufficiency: Secondary | ICD-10-CM | POA: Diagnosis not present

## 2022-12-17 DIAGNOSIS — I6529 Occlusion and stenosis of unspecified carotid artery: Secondary | ICD-10-CM | POA: Diagnosis not present

## 2022-12-19 DIAGNOSIS — K5909 Other constipation: Secondary | ICD-10-CM | POA: Diagnosis not present

## 2022-12-19 DIAGNOSIS — I351 Nonrheumatic aortic (valve) insufficiency: Secondary | ICD-10-CM | POA: Diagnosis not present

## 2022-12-19 DIAGNOSIS — F02A3 Dementia in other diseases classified elsewhere, mild, with mood disturbance: Secondary | ICD-10-CM | POA: Diagnosis not present

## 2022-12-19 DIAGNOSIS — I714 Abdominal aortic aneurysm, without rupture, unspecified: Secondary | ICD-10-CM | POA: Diagnosis not present

## 2022-12-19 DIAGNOSIS — G20A1 Parkinson's disease without dyskinesia, without mention of fluctuations: Secondary | ICD-10-CM | POA: Diagnosis not present

## 2022-12-19 DIAGNOSIS — Z9181 History of falling: Secondary | ICD-10-CM | POA: Diagnosis not present

## 2022-12-19 DIAGNOSIS — I5042 Chronic combined systolic (congestive) and diastolic (congestive) heart failure: Secondary | ICD-10-CM | POA: Diagnosis not present

## 2022-12-19 DIAGNOSIS — I130 Hypertensive heart and chronic kidney disease with heart failure and stage 1 through stage 4 chronic kidney disease, or unspecified chronic kidney disease: Secondary | ICD-10-CM | POA: Diagnosis not present

## 2022-12-19 DIAGNOSIS — N182 Chronic kidney disease, stage 2 (mild): Secondary | ICD-10-CM | POA: Diagnosis not present

## 2022-12-19 DIAGNOSIS — F02A4 Dementia in other diseases classified elsewhere, mild, with anxiety: Secondary | ICD-10-CM | POA: Diagnosis not present

## 2022-12-19 DIAGNOSIS — F32A Depression, unspecified: Secondary | ICD-10-CM | POA: Diagnosis not present

## 2022-12-19 DIAGNOSIS — E785 Hyperlipidemia, unspecified: Secondary | ICD-10-CM | POA: Diagnosis not present

## 2022-12-19 DIAGNOSIS — I6529 Occlusion and stenosis of unspecified carotid artery: Secondary | ICD-10-CM | POA: Diagnosis not present

## 2022-12-22 DIAGNOSIS — F02A4 Dementia in other diseases classified elsewhere, mild, with anxiety: Secondary | ICD-10-CM | POA: Diagnosis not present

## 2022-12-22 DIAGNOSIS — E785 Hyperlipidemia, unspecified: Secondary | ICD-10-CM | POA: Diagnosis not present

## 2022-12-22 DIAGNOSIS — F32A Depression, unspecified: Secondary | ICD-10-CM | POA: Diagnosis not present

## 2022-12-22 DIAGNOSIS — I351 Nonrheumatic aortic (valve) insufficiency: Secondary | ICD-10-CM | POA: Diagnosis not present

## 2022-12-22 DIAGNOSIS — I6529 Occlusion and stenosis of unspecified carotid artery: Secondary | ICD-10-CM | POA: Diagnosis not present

## 2022-12-22 DIAGNOSIS — G20A1 Parkinson's disease without dyskinesia, without mention of fluctuations: Secondary | ICD-10-CM | POA: Diagnosis not present

## 2022-12-22 DIAGNOSIS — F02A3 Dementia in other diseases classified elsewhere, mild, with mood disturbance: Secondary | ICD-10-CM | POA: Diagnosis not present

## 2022-12-22 DIAGNOSIS — I5042 Chronic combined systolic (congestive) and diastolic (congestive) heart failure: Secondary | ICD-10-CM | POA: Diagnosis not present

## 2022-12-22 DIAGNOSIS — K5909 Other constipation: Secondary | ICD-10-CM | POA: Diagnosis not present

## 2022-12-22 DIAGNOSIS — Z9181 History of falling: Secondary | ICD-10-CM | POA: Diagnosis not present

## 2022-12-22 DIAGNOSIS — I714 Abdominal aortic aneurysm, without rupture, unspecified: Secondary | ICD-10-CM | POA: Diagnosis not present

## 2022-12-22 DIAGNOSIS — N182 Chronic kidney disease, stage 2 (mild): Secondary | ICD-10-CM | POA: Diagnosis not present

## 2022-12-22 DIAGNOSIS — I130 Hypertensive heart and chronic kidney disease with heart failure and stage 1 through stage 4 chronic kidney disease, or unspecified chronic kidney disease: Secondary | ICD-10-CM | POA: Diagnosis not present

## 2022-12-29 DIAGNOSIS — I130 Hypertensive heart and chronic kidney disease with heart failure and stage 1 through stage 4 chronic kidney disease, or unspecified chronic kidney disease: Secondary | ICD-10-CM | POA: Diagnosis not present

## 2022-12-29 DIAGNOSIS — I714 Abdominal aortic aneurysm, without rupture, unspecified: Secondary | ICD-10-CM | POA: Diagnosis not present

## 2022-12-29 DIAGNOSIS — I351 Nonrheumatic aortic (valve) insufficiency: Secondary | ICD-10-CM | POA: Diagnosis not present

## 2022-12-29 DIAGNOSIS — N182 Chronic kidney disease, stage 2 (mild): Secondary | ICD-10-CM | POA: Diagnosis not present

## 2022-12-29 DIAGNOSIS — F02A3 Dementia in other diseases classified elsewhere, mild, with mood disturbance: Secondary | ICD-10-CM | POA: Diagnosis not present

## 2022-12-29 DIAGNOSIS — Z9181 History of falling: Secondary | ICD-10-CM | POA: Diagnosis not present

## 2022-12-29 DIAGNOSIS — I6529 Occlusion and stenosis of unspecified carotid artery: Secondary | ICD-10-CM | POA: Diagnosis not present

## 2022-12-29 DIAGNOSIS — F02A4 Dementia in other diseases classified elsewhere, mild, with anxiety: Secondary | ICD-10-CM | POA: Diagnosis not present

## 2022-12-29 DIAGNOSIS — K5909 Other constipation: Secondary | ICD-10-CM | POA: Diagnosis not present

## 2022-12-29 DIAGNOSIS — I5042 Chronic combined systolic (congestive) and diastolic (congestive) heart failure: Secondary | ICD-10-CM | POA: Diagnosis not present

## 2022-12-29 DIAGNOSIS — F32A Depression, unspecified: Secondary | ICD-10-CM | POA: Diagnosis not present

## 2022-12-29 DIAGNOSIS — G20A1 Parkinson's disease without dyskinesia, without mention of fluctuations: Secondary | ICD-10-CM | POA: Diagnosis not present

## 2022-12-29 DIAGNOSIS — E785 Hyperlipidemia, unspecified: Secondary | ICD-10-CM | POA: Diagnosis not present

## 2022-12-31 DIAGNOSIS — E785 Hyperlipidemia, unspecified: Secondary | ICD-10-CM | POA: Diagnosis not present

## 2022-12-31 DIAGNOSIS — F02A3 Dementia in other diseases classified elsewhere, mild, with mood disturbance: Secondary | ICD-10-CM | POA: Diagnosis not present

## 2022-12-31 DIAGNOSIS — G20A1 Parkinson's disease without dyskinesia, without mention of fluctuations: Secondary | ICD-10-CM | POA: Diagnosis not present

## 2022-12-31 DIAGNOSIS — I714 Abdominal aortic aneurysm, without rupture, unspecified: Secondary | ICD-10-CM | POA: Diagnosis not present

## 2022-12-31 DIAGNOSIS — K5909 Other constipation: Secondary | ICD-10-CM | POA: Diagnosis not present

## 2022-12-31 DIAGNOSIS — I130 Hypertensive heart and chronic kidney disease with heart failure and stage 1 through stage 4 chronic kidney disease, or unspecified chronic kidney disease: Secondary | ICD-10-CM | POA: Diagnosis not present

## 2022-12-31 DIAGNOSIS — N182 Chronic kidney disease, stage 2 (mild): Secondary | ICD-10-CM | POA: Diagnosis not present

## 2022-12-31 DIAGNOSIS — Z9181 History of falling: Secondary | ICD-10-CM | POA: Diagnosis not present

## 2022-12-31 DIAGNOSIS — I351 Nonrheumatic aortic (valve) insufficiency: Secondary | ICD-10-CM | POA: Diagnosis not present

## 2022-12-31 DIAGNOSIS — F32A Depression, unspecified: Secondary | ICD-10-CM | POA: Diagnosis not present

## 2022-12-31 DIAGNOSIS — F02A4 Dementia in other diseases classified elsewhere, mild, with anxiety: Secondary | ICD-10-CM | POA: Diagnosis not present

## 2022-12-31 DIAGNOSIS — I6529 Occlusion and stenosis of unspecified carotid artery: Secondary | ICD-10-CM | POA: Diagnosis not present

## 2022-12-31 DIAGNOSIS — I5042 Chronic combined systolic (congestive) and diastolic (congestive) heart failure: Secondary | ICD-10-CM | POA: Diagnosis not present

## 2023-01-03 DIAGNOSIS — F32A Depression, unspecified: Secondary | ICD-10-CM | POA: Diagnosis not present

## 2023-01-03 DIAGNOSIS — I130 Hypertensive heart and chronic kidney disease with heart failure and stage 1 through stage 4 chronic kidney disease, or unspecified chronic kidney disease: Secondary | ICD-10-CM | POA: Diagnosis not present

## 2023-01-03 DIAGNOSIS — G20A1 Parkinson's disease without dyskinesia, without mention of fluctuations: Secondary | ICD-10-CM | POA: Diagnosis not present

## 2023-01-03 DIAGNOSIS — Z9181 History of falling: Secondary | ICD-10-CM | POA: Diagnosis not present

## 2023-01-03 DIAGNOSIS — K5909 Other constipation: Secondary | ICD-10-CM | POA: Diagnosis not present

## 2023-01-03 DIAGNOSIS — I5042 Chronic combined systolic (congestive) and diastolic (congestive) heart failure: Secondary | ICD-10-CM | POA: Diagnosis not present

## 2023-01-03 DIAGNOSIS — I351 Nonrheumatic aortic (valve) insufficiency: Secondary | ICD-10-CM | POA: Diagnosis not present

## 2023-01-03 DIAGNOSIS — E785 Hyperlipidemia, unspecified: Secondary | ICD-10-CM | POA: Diagnosis not present

## 2023-01-03 DIAGNOSIS — I6529 Occlusion and stenosis of unspecified carotid artery: Secondary | ICD-10-CM | POA: Diagnosis not present

## 2023-01-03 DIAGNOSIS — F02A4 Dementia in other diseases classified elsewhere, mild, with anxiety: Secondary | ICD-10-CM | POA: Diagnosis not present

## 2023-01-03 DIAGNOSIS — N182 Chronic kidney disease, stage 2 (mild): Secondary | ICD-10-CM | POA: Diagnosis not present

## 2023-01-03 DIAGNOSIS — I714 Abdominal aortic aneurysm, without rupture, unspecified: Secondary | ICD-10-CM | POA: Diagnosis not present

## 2023-01-03 DIAGNOSIS — F02A3 Dementia in other diseases classified elsewhere, mild, with mood disturbance: Secondary | ICD-10-CM | POA: Diagnosis not present

## 2023-01-06 DIAGNOSIS — F02A3 Dementia in other diseases classified elsewhere, mild, with mood disturbance: Secondary | ICD-10-CM | POA: Diagnosis not present

## 2023-01-06 DIAGNOSIS — I351 Nonrheumatic aortic (valve) insufficiency: Secondary | ICD-10-CM | POA: Diagnosis not present

## 2023-01-06 DIAGNOSIS — K5909 Other constipation: Secondary | ICD-10-CM | POA: Diagnosis not present

## 2023-01-06 DIAGNOSIS — N182 Chronic kidney disease, stage 2 (mild): Secondary | ICD-10-CM | POA: Diagnosis not present

## 2023-01-06 DIAGNOSIS — E785 Hyperlipidemia, unspecified: Secondary | ICD-10-CM | POA: Diagnosis not present

## 2023-01-06 DIAGNOSIS — I6529 Occlusion and stenosis of unspecified carotid artery: Secondary | ICD-10-CM | POA: Diagnosis not present

## 2023-01-06 DIAGNOSIS — I5042 Chronic combined systolic (congestive) and diastolic (congestive) heart failure: Secondary | ICD-10-CM | POA: Diagnosis not present

## 2023-01-06 DIAGNOSIS — F32A Depression, unspecified: Secondary | ICD-10-CM | POA: Diagnosis not present

## 2023-01-06 DIAGNOSIS — Z9181 History of falling: Secondary | ICD-10-CM | POA: Diagnosis not present

## 2023-01-06 DIAGNOSIS — I714 Abdominal aortic aneurysm, without rupture, unspecified: Secondary | ICD-10-CM | POA: Diagnosis not present

## 2023-01-06 DIAGNOSIS — F02A4 Dementia in other diseases classified elsewhere, mild, with anxiety: Secondary | ICD-10-CM | POA: Diagnosis not present

## 2023-01-06 DIAGNOSIS — I130 Hypertensive heart and chronic kidney disease with heart failure and stage 1 through stage 4 chronic kidney disease, or unspecified chronic kidney disease: Secondary | ICD-10-CM | POA: Diagnosis not present

## 2023-01-06 DIAGNOSIS — G20A1 Parkinson's disease without dyskinesia, without mention of fluctuations: Secondary | ICD-10-CM | POA: Diagnosis not present

## 2023-01-07 DIAGNOSIS — Z9181 History of falling: Secondary | ICD-10-CM | POA: Diagnosis not present

## 2023-01-07 DIAGNOSIS — I714 Abdominal aortic aneurysm, without rupture, unspecified: Secondary | ICD-10-CM | POA: Diagnosis not present

## 2023-01-07 DIAGNOSIS — G20A1 Parkinson's disease without dyskinesia, without mention of fluctuations: Secondary | ICD-10-CM | POA: Diagnosis not present

## 2023-01-07 DIAGNOSIS — I130 Hypertensive heart and chronic kidney disease with heart failure and stage 1 through stage 4 chronic kidney disease, or unspecified chronic kidney disease: Secondary | ICD-10-CM | POA: Diagnosis not present

## 2023-01-07 DIAGNOSIS — N182 Chronic kidney disease, stage 2 (mild): Secondary | ICD-10-CM | POA: Diagnosis not present

## 2023-01-07 DIAGNOSIS — F02A4 Dementia in other diseases classified elsewhere, mild, with anxiety: Secondary | ICD-10-CM | POA: Diagnosis not present

## 2023-01-07 DIAGNOSIS — I6529 Occlusion and stenosis of unspecified carotid artery: Secondary | ICD-10-CM | POA: Diagnosis not present

## 2023-01-07 DIAGNOSIS — F02A3 Dementia in other diseases classified elsewhere, mild, with mood disturbance: Secondary | ICD-10-CM | POA: Diagnosis not present

## 2023-01-07 DIAGNOSIS — I5042 Chronic combined systolic (congestive) and diastolic (congestive) heart failure: Secondary | ICD-10-CM | POA: Diagnosis not present

## 2023-01-07 DIAGNOSIS — K5909 Other constipation: Secondary | ICD-10-CM | POA: Diagnosis not present

## 2023-01-07 DIAGNOSIS — E785 Hyperlipidemia, unspecified: Secondary | ICD-10-CM | POA: Diagnosis not present

## 2023-01-07 DIAGNOSIS — F32A Depression, unspecified: Secondary | ICD-10-CM | POA: Diagnosis not present

## 2023-01-07 DIAGNOSIS — I351 Nonrheumatic aortic (valve) insufficiency: Secondary | ICD-10-CM | POA: Diagnosis not present

## 2023-01-11 DIAGNOSIS — I6529 Occlusion and stenosis of unspecified carotid artery: Secondary | ICD-10-CM | POA: Diagnosis not present

## 2023-01-11 DIAGNOSIS — F02A3 Dementia in other diseases classified elsewhere, mild, with mood disturbance: Secondary | ICD-10-CM | POA: Diagnosis not present

## 2023-01-11 DIAGNOSIS — N182 Chronic kidney disease, stage 2 (mild): Secondary | ICD-10-CM | POA: Diagnosis not present

## 2023-01-11 DIAGNOSIS — G20A1 Parkinson's disease without dyskinesia, without mention of fluctuations: Secondary | ICD-10-CM | POA: Diagnosis not present

## 2023-01-11 DIAGNOSIS — F32A Depression, unspecified: Secondary | ICD-10-CM | POA: Diagnosis not present

## 2023-01-11 DIAGNOSIS — I130 Hypertensive heart and chronic kidney disease with heart failure and stage 1 through stage 4 chronic kidney disease, or unspecified chronic kidney disease: Secondary | ICD-10-CM | POA: Diagnosis not present

## 2023-01-11 DIAGNOSIS — E785 Hyperlipidemia, unspecified: Secondary | ICD-10-CM | POA: Diagnosis not present

## 2023-01-11 DIAGNOSIS — I714 Abdominal aortic aneurysm, without rupture, unspecified: Secondary | ICD-10-CM | POA: Diagnosis not present

## 2023-01-11 DIAGNOSIS — I5042 Chronic combined systolic (congestive) and diastolic (congestive) heart failure: Secondary | ICD-10-CM | POA: Diagnosis not present

## 2023-01-11 DIAGNOSIS — F02A4 Dementia in other diseases classified elsewhere, mild, with anxiety: Secondary | ICD-10-CM | POA: Diagnosis not present

## 2023-01-11 DIAGNOSIS — Z9181 History of falling: Secondary | ICD-10-CM | POA: Diagnosis not present

## 2023-01-11 DIAGNOSIS — K5909 Other constipation: Secondary | ICD-10-CM | POA: Diagnosis not present

## 2023-01-11 DIAGNOSIS — I351 Nonrheumatic aortic (valve) insufficiency: Secondary | ICD-10-CM | POA: Diagnosis not present

## 2023-01-17 DIAGNOSIS — Z9181 History of falling: Secondary | ICD-10-CM | POA: Diagnosis not present

## 2023-01-17 DIAGNOSIS — F02A4 Dementia in other diseases classified elsewhere, mild, with anxiety: Secondary | ICD-10-CM | POA: Diagnosis not present

## 2023-01-17 DIAGNOSIS — I351 Nonrheumatic aortic (valve) insufficiency: Secondary | ICD-10-CM | POA: Diagnosis not present

## 2023-01-17 DIAGNOSIS — I130 Hypertensive heart and chronic kidney disease with heart failure and stage 1 through stage 4 chronic kidney disease, or unspecified chronic kidney disease: Secondary | ICD-10-CM | POA: Diagnosis not present

## 2023-01-17 DIAGNOSIS — K5909 Other constipation: Secondary | ICD-10-CM | POA: Diagnosis not present

## 2023-01-17 DIAGNOSIS — F02A3 Dementia in other diseases classified elsewhere, mild, with mood disturbance: Secondary | ICD-10-CM | POA: Diagnosis not present

## 2023-01-17 DIAGNOSIS — N182 Chronic kidney disease, stage 2 (mild): Secondary | ICD-10-CM | POA: Diagnosis not present

## 2023-01-17 DIAGNOSIS — I714 Abdominal aortic aneurysm, without rupture, unspecified: Secondary | ICD-10-CM | POA: Diagnosis not present

## 2023-01-17 DIAGNOSIS — F32A Depression, unspecified: Secondary | ICD-10-CM | POA: Diagnosis not present

## 2023-01-17 DIAGNOSIS — G20A1 Parkinson's disease without dyskinesia, without mention of fluctuations: Secondary | ICD-10-CM | POA: Diagnosis not present

## 2023-01-17 DIAGNOSIS — I5042 Chronic combined systolic (congestive) and diastolic (congestive) heart failure: Secondary | ICD-10-CM | POA: Diagnosis not present

## 2023-01-17 DIAGNOSIS — E785 Hyperlipidemia, unspecified: Secondary | ICD-10-CM | POA: Diagnosis not present

## 2023-01-17 DIAGNOSIS — I6529 Occlusion and stenosis of unspecified carotid artery: Secondary | ICD-10-CM | POA: Diagnosis not present

## 2023-01-25 DIAGNOSIS — I6529 Occlusion and stenosis of unspecified carotid artery: Secondary | ICD-10-CM | POA: Diagnosis not present

## 2023-01-25 DIAGNOSIS — F02A4 Dementia in other diseases classified elsewhere, mild, with anxiety: Secondary | ICD-10-CM | POA: Diagnosis not present

## 2023-01-25 DIAGNOSIS — I130 Hypertensive heart and chronic kidney disease with heart failure and stage 1 through stage 4 chronic kidney disease, or unspecified chronic kidney disease: Secondary | ICD-10-CM | POA: Diagnosis not present

## 2023-01-25 DIAGNOSIS — N182 Chronic kidney disease, stage 2 (mild): Secondary | ICD-10-CM | POA: Diagnosis not present

## 2023-01-25 DIAGNOSIS — F32A Depression, unspecified: Secondary | ICD-10-CM | POA: Diagnosis not present

## 2023-01-25 DIAGNOSIS — F02A3 Dementia in other diseases classified elsewhere, mild, with mood disturbance: Secondary | ICD-10-CM | POA: Diagnosis not present

## 2023-01-25 DIAGNOSIS — E785 Hyperlipidemia, unspecified: Secondary | ICD-10-CM | POA: Diagnosis not present

## 2023-01-25 DIAGNOSIS — Z9181 History of falling: Secondary | ICD-10-CM | POA: Diagnosis not present

## 2023-01-25 DIAGNOSIS — I5042 Chronic combined systolic (congestive) and diastolic (congestive) heart failure: Secondary | ICD-10-CM | POA: Diagnosis not present

## 2023-01-25 DIAGNOSIS — G20A1 Parkinson's disease without dyskinesia, without mention of fluctuations: Secondary | ICD-10-CM | POA: Diagnosis not present

## 2023-01-25 DIAGNOSIS — I351 Nonrheumatic aortic (valve) insufficiency: Secondary | ICD-10-CM | POA: Diagnosis not present

## 2023-01-25 DIAGNOSIS — K5909 Other constipation: Secondary | ICD-10-CM | POA: Diagnosis not present

## 2023-01-25 DIAGNOSIS — I714 Abdominal aortic aneurysm, without rupture, unspecified: Secondary | ICD-10-CM | POA: Diagnosis not present

## 2023-02-01 DIAGNOSIS — G20A1 Parkinson's disease without dyskinesia, without mention of fluctuations: Secondary | ICD-10-CM | POA: Diagnosis not present

## 2023-02-01 DIAGNOSIS — I714 Abdominal aortic aneurysm, without rupture, unspecified: Secondary | ICD-10-CM | POA: Diagnosis not present

## 2023-02-01 DIAGNOSIS — E785 Hyperlipidemia, unspecified: Secondary | ICD-10-CM | POA: Diagnosis not present

## 2023-02-01 DIAGNOSIS — Z9181 History of falling: Secondary | ICD-10-CM | POA: Diagnosis not present

## 2023-02-01 DIAGNOSIS — I6529 Occlusion and stenosis of unspecified carotid artery: Secondary | ICD-10-CM | POA: Diagnosis not present

## 2023-02-01 DIAGNOSIS — I5042 Chronic combined systolic (congestive) and diastolic (congestive) heart failure: Secondary | ICD-10-CM | POA: Diagnosis not present

## 2023-02-01 DIAGNOSIS — N182 Chronic kidney disease, stage 2 (mild): Secondary | ICD-10-CM | POA: Diagnosis not present

## 2023-02-01 DIAGNOSIS — F32A Depression, unspecified: Secondary | ICD-10-CM | POA: Diagnosis not present

## 2023-02-01 DIAGNOSIS — F02A3 Dementia in other diseases classified elsewhere, mild, with mood disturbance: Secondary | ICD-10-CM | POA: Diagnosis not present

## 2023-02-01 DIAGNOSIS — I130 Hypertensive heart and chronic kidney disease with heart failure and stage 1 through stage 4 chronic kidney disease, or unspecified chronic kidney disease: Secondary | ICD-10-CM | POA: Diagnosis not present

## 2023-02-01 DIAGNOSIS — K5909 Other constipation: Secondary | ICD-10-CM | POA: Diagnosis not present

## 2023-02-01 DIAGNOSIS — F02A4 Dementia in other diseases classified elsewhere, mild, with anxiety: Secondary | ICD-10-CM | POA: Diagnosis not present

## 2023-02-01 DIAGNOSIS — I351 Nonrheumatic aortic (valve) insufficiency: Secondary | ICD-10-CM | POA: Diagnosis not present

## 2023-02-08 DIAGNOSIS — G20A1 Parkinson's disease without dyskinesia, without mention of fluctuations: Secondary | ICD-10-CM | POA: Diagnosis not present

## 2023-02-08 DIAGNOSIS — R634 Abnormal weight loss: Secondary | ICD-10-CM | POA: Diagnosis not present

## 2023-02-08 DIAGNOSIS — I150 Renovascular hypertension: Secondary | ICD-10-CM | POA: Diagnosis not present

## 2023-02-08 DIAGNOSIS — I5040 Unspecified combined systolic (congestive) and diastolic (congestive) heart failure: Secondary | ICD-10-CM | POA: Diagnosis not present

## 2023-03-16 DIAGNOSIS — R54 Age-related physical debility: Secondary | ICD-10-CM | POA: Diagnosis not present

## 2023-03-16 DIAGNOSIS — I5040 Unspecified combined systolic (congestive) and diastolic (congestive) heart failure: Secondary | ICD-10-CM | POA: Diagnosis not present

## 2023-03-16 DIAGNOSIS — G20A1 Parkinson's disease without dyskinesia, without mention of fluctuations: Secondary | ICD-10-CM | POA: Diagnosis not present

## 2023-03-16 DIAGNOSIS — Z6820 Body mass index (BMI) 20.0-20.9, adult: Secondary | ICD-10-CM | POA: Diagnosis not present

## 2023-05-31 ENCOUNTER — Observation Stay (HOSPITAL_COMMUNITY)
Admission: EM | Admit: 2023-05-31 | Discharge: 2023-06-01 | Disposition: A | Discharge status: Hospice | Payer: Medicare Other | Attending: Student | Admitting: Student

## 2023-05-31 ENCOUNTER — Emergency Department (HOSPITAL_COMMUNITY): Payer: Medicare Other

## 2023-05-31 DIAGNOSIS — R404 Transient alteration of awareness: Secondary | ICD-10-CM | POA: Diagnosis not present

## 2023-05-31 DIAGNOSIS — I447 Left bundle-branch block, unspecified: Secondary | ICD-10-CM | POA: Diagnosis not present

## 2023-05-31 DIAGNOSIS — F0280 Dementia in other diseases classified elsewhere without behavioral disturbance: Secondary | ICD-10-CM | POA: Insufficient documentation

## 2023-05-31 DIAGNOSIS — G9341 Metabolic encephalopathy: Secondary | ICD-10-CM | POA: Diagnosis not present

## 2023-05-31 DIAGNOSIS — I5042 Chronic combined systolic (congestive) and diastolic (congestive) heart failure: Secondary | ICD-10-CM | POA: Insufficient documentation

## 2023-05-31 DIAGNOSIS — E876 Hypokalemia: Secondary | ICD-10-CM | POA: Diagnosis not present

## 2023-05-31 DIAGNOSIS — Z79899 Other long term (current) drug therapy: Secondary | ICD-10-CM | POA: Insufficient documentation

## 2023-05-31 DIAGNOSIS — E870 Hyperosmolality and hypernatremia: Secondary | ICD-10-CM | POA: Diagnosis not present

## 2023-05-31 DIAGNOSIS — R4182 Altered mental status, unspecified: Secondary | ICD-10-CM

## 2023-05-31 DIAGNOSIS — G20C Parkinsonism, unspecified: Secondary | ICD-10-CM | POA: Diagnosis not present

## 2023-05-31 DIAGNOSIS — I10 Essential (primary) hypertension: Secondary | ICD-10-CM | POA: Diagnosis not present

## 2023-05-31 DIAGNOSIS — I2510 Atherosclerotic heart disease of native coronary artery without angina pectoris: Secondary | ICD-10-CM | POA: Insufficient documentation

## 2023-05-31 DIAGNOSIS — R2990 Unspecified symptoms and signs involving the nervous system: Secondary | ICD-10-CM

## 2023-05-31 DIAGNOSIS — R55 Syncope and collapse: Secondary | ICD-10-CM | POA: Diagnosis not present

## 2023-05-31 DIAGNOSIS — N179 Acute kidney failure, unspecified: Secondary | ICD-10-CM | POA: Diagnosis not present

## 2023-05-31 DIAGNOSIS — I110 Hypertensive heart disease with heart failure: Secondary | ICD-10-CM | POA: Insufficient documentation

## 2023-05-31 DIAGNOSIS — R29810 Facial weakness: Secondary | ICD-10-CM | POA: Diagnosis not present

## 2023-05-31 DIAGNOSIS — I6782 Cerebral ischemia: Secondary | ICD-10-CM | POA: Diagnosis not present

## 2023-05-31 DIAGNOSIS — R7989 Other specified abnormal findings of blood chemistry: Secondary | ICD-10-CM

## 2023-05-31 LAB — COMPREHENSIVE METABOLIC PANEL
ALT: 25 U/L (ref 0–44)
AST: 27 U/L (ref 15–41)
Albumin: 3.8 g/dL (ref 3.5–5.0)
Alkaline Phosphatase: 77 U/L (ref 38–126)
Anion gap: 13 (ref 5–15)
BUN: 134 mg/dL — ABNORMAL HIGH (ref 8–23)
CO2: 32 mmol/L (ref 22–32)
Calcium: 9.4 mg/dL (ref 8.9–10.3)
Chloride: 120 mmol/L — ABNORMAL HIGH (ref 98–111)
Creatinine, Ser: 3.18 mg/dL — ABNORMAL HIGH (ref 0.44–1.00)
GFR, Estimated: 14 mL/min — ABNORMAL LOW (ref >60)
Glucose, Bld: 139 mg/dL — ABNORMAL HIGH (ref 70–99)
Potassium: 2.9 mmol/L — ABNORMAL LOW (ref 3.5–5.1)
Sodium: 165 mmol/L (ref 135–145)
Total Bilirubin: 1 mg/dL (ref <1.2)
Total Protein: 7.7 g/dL (ref 6.5–8.1)

## 2023-05-31 LAB — CBC
HCT: 41.3 % (ref 36.0–46.0)
Hemoglobin: 12.6 g/dL (ref 12.0–15.0)
MCH: 31 pg (ref 26.0–34.0)
MCHC: 30.5 g/dL (ref 30.0–36.0)
MCV: 101.7 fL — ABNORMAL HIGH (ref 80.0–100.0)
Platelets: 379 10*3/uL (ref 150–400)
RBC: 4.06 MIL/uL (ref 3.87–5.11)
RDW: 13.6 % (ref 11.5–15.5)
WBC: 11.6 10*3/uL — ABNORMAL HIGH (ref 4.0–10.5)
nRBC: 0 % (ref 0.0–0.2)

## 2023-05-31 LAB — PROTIME-INR
INR: 1.1 (ref 0.8–1.2)
Prothrombin Time: 14.4 s (ref 11.4–15.2)

## 2023-05-31 LAB — DIFFERENTIAL
Abs Immature Granulocytes: 0.06 10*3/uL (ref 0.00–0.07)
Basophils Absolute: 0 10*3/uL (ref 0.0–0.1)
Basophils Relative: 0 %
Eosinophils Absolute: 0.1 10*3/uL (ref 0.0–0.5)
Eosinophils Relative: 1 %
Immature Granulocytes: 1 %
Lymphocytes Relative: 15 %
Lymphs Abs: 1.8 10*3/uL (ref 0.7–4.0)
Monocytes Absolute: 0.6 10*3/uL (ref 0.1–1.0)
Monocytes Relative: 6 %
Neutro Abs: 9.1 10*3/uL — ABNORMAL HIGH (ref 1.7–7.7)
Neutrophils Relative %: 77 %

## 2023-05-31 LAB — I-STAT CHEM 8, ED
BUN: 128 mg/dL — ABNORMAL HIGH (ref 8–23)
Calcium, Ion: 1 mmol/L — ABNORMAL LOW (ref 1.15–1.40)
Chloride: 126 mmol/L — ABNORMAL HIGH (ref 98–111)
Creatinine, Ser: 3.1 mg/dL — ABNORMAL HIGH (ref 0.44–1.00)
Glucose, Bld: 130 mg/dL — ABNORMAL HIGH (ref 70–99)
HCT: 33 % — ABNORMAL LOW (ref 36.0–46.0)
Hemoglobin: 11.2 g/dL — ABNORMAL LOW (ref 12.0–15.0)
Potassium: 3 mmol/L — ABNORMAL LOW (ref 3.5–5.1)
Sodium: 168 mmol/L (ref 135–145)
TCO2: 32 mmol/L (ref 22–32)

## 2023-05-31 LAB — APTT: aPTT: 22 s — ABNORMAL LOW (ref 24–36)

## 2023-05-31 LAB — ETHANOL: Alcohol, Ethyl (B): <10 mg/dL (ref <10)

## 2023-05-31 LAB — CBG MONITORING, ED: Glucose-Capillary: 131 mg/dL — ABNORMAL HIGH (ref 70–99)

## 2023-05-31 MED ORDER — ACETAMINOPHEN 650 MG RE SUPP: 650.0000 mg | Freq: Four times a day (QID) | RECTAL | Status: DC | PRN

## 2023-05-31 MED ORDER — HALOPERIDOL LACTATE 5 MG/ML IJ SOLN: 2.5000 mg | INTRAMUSCULAR | Status: DC | PRN

## 2023-05-31 MED ORDER — SODIUM CHLORIDE 0.9% FLUSH
3.0000 mL | Freq: Once | INTRAVENOUS | Status: AC
  Administered 2023-05-31: 3 mL via INTRAVENOUS

## 2023-05-31 MED ORDER — MORPHINE SULFATE (PF) 2 MG/ML IV SOLN: 2.0000 mg | INTRAVENOUS | Status: DC | PRN

## 2023-05-31 MED ORDER — LACTATED RINGERS IV BOLUS
1000.0000 mL | Freq: Once | INTRAVENOUS | Status: AC
  Administered 2023-05-31: 1000 mL via INTRAVENOUS

## 2023-05-31 MED ORDER — ACETAMINOPHEN 325 MG PO TABS: 650.0000 mg | ORAL_TABLET | Freq: Four times a day (QID) | ORAL | Status: DC | PRN

## 2023-05-31 MED ORDER — GLYCOPYRROLATE 0.2 MG/ML IJ SOLN: 0.2000 mg | INTRAMUSCULAR | Status: DC | PRN

## 2023-05-31 MED ORDER — ONDANSETRON HCL 4 MG/2ML IJ SOLN: 4.0000 mg | Freq: Four times a day (QID) | INTRAMUSCULAR | Status: DC | PRN

## 2023-05-31 MED ORDER — POLYVINYL ALCOHOL 1.4 % OP SOLN: 1.0000 [drp] | Freq: Four times a day (QID) | OPHTHALMIC | Status: DC | PRN

## 2023-05-31 MED ORDER — ONDANSETRON 4 MG PO TBDP: 4.0000 mg | ORAL_TABLET | Freq: Four times a day (QID) | ORAL | Status: DC | PRN

## 2023-05-31 MED ORDER — GLYCOPYRROLATE 1 MG PO TABS: 1.0000 mg | ORAL_TABLET | ORAL | Status: DC | PRN

## 2023-05-31 MED ORDER — MIDAZOLAM HCL 2 MG/2ML IJ SOLN: 2.0000 mg | INTRAMUSCULAR | Status: DC | PRN

## 2023-05-31 NOTE — ED Provider Notes (Signed)
Portal EMERGENCY DEPARTMENT AT [facility] Provider Note   CSN: [id number] Arrival date & time: 05/31/23  2234  An emergency department physician performed an initial assessment on this suspected stroke patient at 2235.  History  Chief Complaint  Patient presents with   Code Stroke    Elizabeth Mccullough is a 82 y.o. female.  HPI Patient presents for altered mental status.  Medical history includes CHF, CAD, AAA, HLD, Parkinson's disease, anxiety, depression.  She has EF of 20 to 25%.  She is on hospice.  Today, staff at nursing facility noted responsiveness.  In the evening, nurse advised to bring to the hospital.  She arrives with her daughter.  Daughter confirms hospice/DNR/DNI.  Daughter does not want any aggressive interventions.  She request comfort care.    Home Medications Prior to Admission medications   Medication Sig Start Date End Date Taking? Authorizing Provider  acetaminophen (TYLENOL) 325 MG tablet Take 2 tablets (650 mg total) by mouth every 6 (six) hours as needed for mild pain or headache. Patient taking differently: Take 650 mg by mouth every 4 (four) hours as needed for mild pain (pain score 1-3) or headache. 05/28/20  Yes Angiulli, Mcarthur Rossetti, PA-C  docusate sodium (COLACE) 100 MG capsule Take 1 capsule (100 mg total) by mouth 2 (two) times daily. 10/06/21  Yes Hongalgi, Maximino Greenland, MD  guaiFENesin 200 MG tablet Take 200 mg by mouth every 4 (four) hours as needed for cough or to loosen phlegm.   Yes [provider]  LORazepam (ATIVAN) 0.5 MG tablet Take 0.25 mg by mouth 2 (two) times daily. 03/30/23  Yes [provider]  polyethylene glycol (MIRALAX / GLYCOLAX) 17 g packet Take 17 g by mouth daily as needed for mild constipation or moderate constipation. 10/06/21  Yes Hongalgi, Maximino Greenland, MD  sertraline (ZOLOFT) 100 MG tablet TAKE 1 TABLET (100 MG TOTAL) BY MOUTH DAILY. 01/07/22  Yes Seabron Spates R, DO  torsemide (DEMADEX) 20 MG  tablet Take 40 mg by mouth daily.   Yes [provider]      Allergies    Iodinated contrast media, Iohexol, and Penicillins    Review of Systems   Review of Systems  Unable to perform ROS: Mental status change    Physical Exam Updated Vital Signs BP (!) 137/54 (BP Location: Right Arm)   Pulse (!) 103   Temp 97.7 F (36.5 C) (Axillary)   Resp 20   Ht 5\' 2"  (1.575 m)   Wt 32.2 kg   LMP 07/19/1988   SpO2 (!) 74%   BMI 12.98 kg/m  Physical Exam Vitals and nursing note reviewed.  Constitutional:      General: She is not in acute distress.    Appearance: She is well-developed and underweight. She is ill-appearing. She is not toxic-appearing or diaphoretic.  HENT:     Head: Normocephalic and atraumatic.     Right Ear: External ear normal.     Left Ear: External ear normal.     Nose: Nose normal.  Cardiovascular:     Rate and Rhythm: Normal rate and regular rhythm.  Pulmonary:     Effort: Pulmonary effort is normal. No respiratory distress.  Abdominal:     General: There is no distension.     Palpations: Abdomen is soft.  Musculoskeletal:        General: No swelling or deformity.     Cervical back: Neck supple.  Skin:    General: Skin  is warm and dry.     Coloration: Skin is not jaundiced or pale.  Neurological:     Mental Status: She is alert.     GCS: GCS eye subscore is 1. GCS verbal subscore is 1. GCS motor subscore is 5.     ED Results / Procedures / Treatments   Labs (all labs ordered are listed, but only abnormal results are displayed) Labs Reviewed  APTT - Abnormal; Notable for the following components:      Result Value   aPTT 22 (*)    All other components within normal limits  CBC - Abnormal; Notable for the following components:   WBC 11.6 (*)    MCV 101.7 (*)    All other components within normal limits  DIFFERENTIAL - Abnormal; Notable for the following components:   Neutro Abs 9.1 (*)    All other components within normal limits   COMPREHENSIVE METABOLIC PANEL - Abnormal; Notable for the following components:   Sodium 165 (*)    Potassium 2.9 (*)    Chloride 120 (*)    Glucose, Bld 139 (*)    BUN 134 (*)    Creatinine, Ser 3.18 (*)    GFR, Estimated 14 (*)    All other components within normal limits  I-STAT CHEM 8, ED - Abnormal; Notable for the following components:   Sodium 168 (*)    Potassium 3.0 (*)    Chloride 126 (*)    BUN 128 (*)    Creatinine, Ser 3.10 (*)    Glucose, Bld 130 (*)    Calcium, Ion 1.00 (*)    Hemoglobin 11.2 (*)    HCT 33.0 (*)    All other components within normal limits  CBG MONITORING, ED - Abnormal; Notable for the following components:   Glucose-Capillary 131 (*)    All other components within normal limits  PROTIME-INR  ETHANOL    EKG None  Radiology CT HEAD CODE STROKE WO CONTRAST  Result Date: 05/31/2023 CLINICAL DATA:  Code stroke. Initial evaluation for acute neuro deficit, stroke suspected. EXAM: CT HEAD WITHOUT CONTRAST TECHNIQUE: Contiguous axial images were obtained from the base of the skull through the vertex without intravenous contrast. RADIATION DOSE REDUCTION: This exam was performed according to the departmental dose-optimization program which includes automated exposure control, adjustment of the mA and/or kV according to patient size and/or use of iterative reconstruction technique. COMPARISON:  Prior study from 12/19/2020. FINDINGS: Brain: Moderately advanced age-related cerebral atrophy with chronic small vessel ischemic disease. No acute intracranial hemorrhage. No acute large vessel territory infarct. No mass lesion or midline shift. Ventricular prominence related global parenchymal volume loss without hydrocephalus. No extra-axial fluid collection. Vascular: No abnormal hyperdense vessel. Scattered vascular calcifications noted within the carotid siphons. Skull: Scalp soft tissues demonstrate no acute finding. Calvarium intact. Sinuses/Orbits: Globes and  orbital soft tissues within normal limits. Visualized paranasal sinuses are largely clear. Trace left mastoid effusion, of doubtful significance. Other: None. ASPECTS [facility] Stroke Program Early CT Score) - Ganglionic level infarction (caudate, lentiform nuclei, internal capsule, insula, M1-M3 cortex): 7 - Supraganglionic infarction (M4-M6 cortex): 3 Total score (0-10 with 10 being normal): 10 IMPRESSION: 1. No acute intracranial abnormality. 2. ASPECTS is 10. 3. Moderately advanced age-related cerebral atrophy with chronic small vessel ischemic disease. These results were communicated to Dr. Wilford Corner at 10:53 pm on 05/31/2023 by text page via the [facility] messaging system. Electronically Signed   By: Rise Mu M.D.   On: 05/31/2023 22:54  Procedures Procedures    Medications Ordered in ED Medications  acetaminophen (TYLENOL) tablet 650 mg (has no administration in time range)    Or  acetaminophen (TYLENOL) suppository 650 mg (has no administration in time range)  glycopyrrolate (ROBINUL) tablet 1 mg (has no administration in time range)    Or  glycopyrrolate (ROBINUL) injection 0.2 mg (has no administration in time range)    Or  glycopyrrolate (ROBINUL) injection 0.2 mg (has no administration in time range)  polyvinyl alcohol (LIQUIFILM TEARS) 1.4 % ophthalmic solution 1 drop (has no administration in time range)  morphine (PF) 2 MG/ML injection 2-4 mg (has no administration in time range)  midazolam (VERSED) injection 2-4 mg (has no administration in time range)  haloperidol lactate (HALDOL) injection 2.5-5 mg (has no administration in time range)  ondansetron (ZOFRAN-ODT) disintegrating tablet 4 mg (has no administration in time range)    Or  ondansetron (ZOFRAN) injection 4 mg (has no administration in time range)  dextrose 5 % solution ( Intravenous New Bag/Given 06/01/23 0110)  sodium chloride flush (NS) 0.9 % injection 3 mL (3 mLs Intravenous Given 05/31/23 2312)  lactated  ringers bolus 1,000 mL (0 mLs Intravenous Stopped 06/01/23 0130)    ED Course/ Medical Decision Making/ A&P                                 Medical Decision Making Amount and/or Complexity of Data Reviewed Labs: ordered. Radiology: ordered.  Risk OTC drugs. Prescription drug management. Decision regarding hospitalization.  Patient presenting for altered mental status.  She is on hospice for advanced dementia.  She arrives in the ED with her daughter.  At time of arrival, neurologist on-call, Dr. Wilford Corner, was present to speak with family.  They did notice a facial droop at nursing facility.  Although she arrives as a code stroke, daughter confirms that she would not want any aggressive interventions.  Daughter requests comfort care measures.  On assessment, patient is not in distress.  She is minimally responsive with some twitching movements in her extremities.  Vital signs are currently normal.  She is protecting her airway.  I-STAT shows acute renal failure with azotemia, likely cause of her altered mental state.  IV fluids and comfort measures were ordered.  Was admitted for further management.        Final Clinical Impression(s) / ED Diagnoses Final diagnoses:  Acute renal failure, unspecified acute renal failure type (HCC)  Azotemia  Hypernatremia  Altered mental status, unspecified altered mental status type    Rx / DC Orders ED Discharge Orders     None         Gloris Manchester, MD 06/01/23 [phone]

## 2023-05-31 NOTE — Code Documentation (Signed)
Stroke Response Nurse Documentation Code Documentation  Elizabeth Mccullough is a 82 y.o. female arriving to [facility]  via [facility] on 11/12 with past medical hx of advanced dementia on hospice care, anxiety, depression, CHF, CAD, AAA, HLD. On No antithrombotic. Code stroke was activated by EMS.   Patient from SNF where she was [facility] at [facility] and now complaining of weakness and facial droop.   Stroke team at the bedside on patient arrival. Labs drawn and patient cleared for CT by Ivar Drape PA. Patient to CT with team. NIHSS 24, see documentation for details and code stroke times. Patient with decreased LOC, disoriented, not following commands, {Right/left:16020} gaze preference , {Right/left:16020} hemianopia, {Right/left:16020} facial droop, {Right/left:16020} arm weakness, {Right/left:16020} leg weakness, {Aphasia:23444} aphasia , and dysarthria  on exam. The following imaging was completed:  {Imaging Stroke:27135}. Patient {ACTION; IS/IS [id number]} a candidate for IV Thrombolytic due to ***. Patient {ACTION; IS/IS [id number]} a candidate for IR due to ***.   Care Plan: ***.   Bedside handoff with ED RN ***.    Rose Fillers  Rapid Response RN

## 2023-05-31 NOTE — Consult Note (Signed)
Brief no charge code stroke note  Subjective: Patient was brought in as a code stroke from her residential facility where she is on hospice care for concern for strokelike symptoms.  Sudden onset of unresponsiveness with last known well sometime around noon.  4:30 PM noted to be in this condition.  Hospice nurse came in and requested hospital transfer for evaluation.  EMS communicated with ED charge who activated a code stroke.  Patient unable to provide any history  Objective: Patient is unresponsive to voice She resists eye opening Gaze is midline Appears generally very dry in terms of her oral mucous membranes Face appears asymmetric with some left lower droop. No response to noxious stimulation in bilateral upper extremities Minimal withdrawal to noxious stimulation in bilateral lower extremities  Later family arrived and wishes that patient be comfort measures only # code stroke was canceled at that time.  CT head was done as a part of code stroke process-no acute process.  Severe uremia and hypernatremia-likely the cause of altered mentation.  Assessment and plan: Uremic/metabolic encephalopathy. Patient on hospice care for advanced dementia, family wanting comfort measures only. No further inpatient neurological workup at this time. Plan was discussed with Dr. Durwin Mccullough, EDP.   Elizabeth Dikes, MD Neurology

## 2023-06-01 ENCOUNTER — Encounter (HOSPITAL_COMMUNITY): Payer: Self-pay | Admitting: Family Medicine

## 2023-06-01 ENCOUNTER — Other Ambulatory Visit: Payer: Self-pay

## 2023-06-01 DIAGNOSIS — N179 Acute kidney failure, unspecified: Secondary | ICD-10-CM | POA: Diagnosis not present

## 2023-06-01 DIAGNOSIS — E876 Hypokalemia: Secondary | ICD-10-CM

## 2023-06-01 DIAGNOSIS — E870 Hyperosmolality and hypernatremia: Secondary | ICD-10-CM | POA: Diagnosis not present

## 2023-06-01 DIAGNOSIS — G9341 Metabolic encephalopathy: Secondary | ICD-10-CM | POA: Diagnosis not present

## 2023-06-01 MED ORDER — HALOPERIDOL LACTATE 5 MG/ML IJ SOLN: 2.5000 mg | INTRAMUSCULAR | Status: DC | PRN

## 2023-06-01 MED ORDER — DEXTROSE 5 % IV SOLN: INTRAVENOUS | Status: DC

## 2023-06-01 NOTE — Social Work (Addendum)
CSW was advised by [facility] family had been discussed with and advised of [facility] and was agreeable. Pt admitted from ALF with Hospice. CSW was advised that family was wondering when DC would be, CSW offered to call family to let them know MD needed to come from a different location but everything was ready for when pt was discharged. Per chart review, pt has 2 daughters and a son listed. Pt listed as having a legal guardian but has no LG documentation. Pt has all three children listed as able to receive information. Tresa Endo is listed first, CSW called and introduced self and stated the call was to update on DC plans to [place] and barriers. Tresa Endo became upset stating "why is my mother in the hospital, what is wrong with her, Oh my god is she dying?" CSW stated that she had been advised that family had been advised of this and agreeable. CSW advised that [facility] or an MD would need to get involved for further information as CSW could not discuss pt's medical status. CSW called hospice rep and left a message. CSW updated team in chat.  CSW spoke with hospice rep who noted that she confirmed with involved family they are still on board to DC to [place]. Hospice rep to call for transport.

## 2023-06-01 NOTE — Assessment & Plan Note (Signed)
-  on IV fluids.  -no repeat labs with comfort care

## 2023-06-01 NOTE — Progress Notes (Signed)
Palliative:  Discussed with hospice liaison. Already plans for transition to [facility] for end of life care. Goals are clear and plan in process. No palliative needs at this time. Hospice involved and completing goals of care with family.   No charge  Yong Channel, NP Palliative Medicine Team Pager [phone] (Please see [email or web address] for schedule) Team Phone [phone]

## 2023-06-01 NOTE — Discharge Summary (Signed)
[facility] Discharge Summary   Patient: Elizabeth Mccullough ZOX:[id number]  PCP: Donato Schultz, DO  Date of admission: 05/31/2023   Date of discharge:  06/01/2023     Discharge Diagnoses:  Principal Problem:   Acute metabolic encephalopathy Active Problems:   Hypernatremia   Acute renal failure (ARF) (HCC)   Hypokalemia   Admitted From: SNF/Hospice Disposition: Hospice facility  Recommendations for Outpatient Follow-up:  F/u Hospice care Follow up LABS/TEST:  None   Diet recommendation: advance if able to eat  Activity: The patient is advised to gradually reintroduce usual activities, as tolerated  Discharge Condition: stable  Code Status: DNR/DNI Limited  History of present illness: As per the H and P dictated on admission Hospital Course:  Elizabeth Mccullough is a 82 y.o. female with medical history significant of chronic combined systolic and diastolic heart failure, carotid artery disease, AAA, hypertension, Parkinson's disease, advanced dementia on hospice care who was brought in for strokelike symptoms.   History obtained from ED documentation as patient is unresponsive.  Patient reportedly had sudden onset of unresponsiveness at 4:30 PM with last known well sometime around noon.  Patient was brought in as a code stroke.  She was evaluated by neurologist Dr. Wilford Corner and pt was unresponsive with minimal withdrawal to noxious stimuli.  Appears dehydrated. CT head done as part of code stroke revealed no acute process.   Lab work was notable for severe dehydration with severe hyponatremia of 165, hypokalemia of 2.9, acute renal failure with creatinine of 3.18.   Daughter at bedside reports that pt cared for her husband with Parkinson's disease who passed 5 years ago. She then shortly developed dementia with frequent falls and had gradual decline. In the past 6 months she has became non-verbal and only gets around in wheelchair. This week has stopped eating or  drinking.    Daughter just wish for her to be comfortable and feels her mother has no quality of life and that she would not want her to "live like this." She understand that no treatment at this point will bring her mother back to being communicative and well. She would like pt to remain on IV fluids and vital checks overnight and discuss next steps with her siblings. She does not want any further lab draws or more workup.    Pt current at SNF under hospice care but wants her to be at an actual hospice facility.    Assessment and Plan:  # Acute metabolic encephalopathy - Patient at baseline with severe dementia with Parkinson's disease.  At baseline she is nonverbal and wheelchair-bound.  She presented today with unresponsiveness after no oral intake for the past week.  She was found to have severe dehydration with sodium of 165 and acute renal failure. -Daughter would like comfort care measures for her mother but would like for her to remain on IV fluid overnight until she discuss further with her siblings. Daughter understand that pt will not have any meaningful recovery from this hospitalization.  -PRN orders with antipyretic, narcotics, agitation ordered.  -discontinued any lab draws.  No further workup. -Palliative and TOCM consulted to assist with placement. Pt under hospice care at SNF but daughter wants her at a different facility.  11/13 as per [facility] patient got accepted at hospice care facility back in place.  Patient can be discharged Under hospice service today      # Hypernatremia Na of 165, s/p D5W. no further lab follow up on comfort care #  Hypokalemia, s/p IV fluids.  no repeat labs with comfort care # Acute renal failure (ARF), secondary to severe dehydration. S/p IV fluid. No more labs  Body mass index is 12.98 kg/m.  Nutrition Interventions:  Pressure Injury 06/01/23 Buttocks Right Stage 2 -  Partial thickness loss of dermis presenting as a shallow open injury with a  red, pink wound bed without slough. (Active)  06/01/23 0400  Location: Buttocks  Location Orientation: Right  Staging: Stage 2 -  Partial thickness loss of dermis presenting as a shallow open injury with a red, pink wound bed without slough.  Wound Description (Comments):   Present on Admission: Yes  Dressing Type Foam - Lift dressing to assess site every shift 06/01/23 1014    Patient is laying comfortably and stable to discharge to hospice facility today.   Consultants: Neurology, palliative care and hospice service Procedures: None  Discharge Exam: General: Appear in no distress,  Cardiovascular: S1 and S2 Present, no Murmur, Respiratory: normal respiratory effort,  Abdomen: Bowel Sound present, Soft and no tenderness Extremities: no Pedal edema Neurology: No focal deficit, patient was obtunded, unable to follow commands.  Filed Weights   06/01/23 0134  Weight: 32.2 kg   Vitals:   06/01/23 0328 06/01/23 0741  BP: (!) 137/54 (!) 113/59  Pulse: (!) 103 80  Resp: 20 16  Temp: 97.7 F (36.5 C) 98.4 F (36.9 C)  SpO2: (!) 74% 91%    DISCHARGE MEDICATION: Allergies as of 06/01/2023       Reactions   Iodinated Contrast Media Rash   Iohexol Rash    Code: RASH, Desc: PATIENT STATES SHE IS ALLERGIC TO IV DYE. 20 YRS AGO SHE HAD A REACTION AT [facility], PT WAS GIVEN BENADRYL. 07/13/06/RM, Onset Date: [id number]   Penicillins Hives        Medication List     STOP taking these medications    acetaminophen 325 MG tablet Commonly known as: TYLENOL   docusate sodium 100 MG capsule Commonly known as: COLACE   guaiFENesin 200 MG tablet   LORazepam 0.5 MG tablet Commonly known as: ATIVAN   polyethylene glycol 17 g packet Commonly known as: MIRALAX / GLYCOLAX   sertraline 100 MG tablet Commonly known as: ZOLOFT   torsemide 20 MG tablet Commonly known as: [facility]               Discharge Care Instructions  (From admission, onward)            Start     Ordered   06/01/23 0000  Discharge wound care:       Comments: As above   06/01/23 1325           Allergies  Allergen Reactions   Iodinated Contrast Media Rash   Iohexol Rash     Code: RASH, Desc: PATIENT STATES SHE IS ALLERGIC TO IV DYE. 20 YRS AGO SHE HAD A REACTION AT [facility], PT WAS GIVEN BENADRYL. 07/13/06/RM, Onset Date: [id number]    Penicillins Hives   Discharge Instructions     Discharge instructions   Complete by: As directed    Follow-up with hospice care   Discharge wound care:   Complete by: As directed    As above   Increase activity slowly   Complete by: As directed        The results of significant diagnostics from this hospitalization (including imaging, microbiology, ancillary and laboratory) are listed below for reference.    Significant Diagnostic Studies: CT  HEAD CODE STROKE WO CONTRAST  Result Date: 05/31/2023 CLINICAL DATA:  Code stroke. Initial evaluation for acute neuro deficit, stroke suspected. EXAM: CT HEAD WITHOUT CONTRAST TECHNIQUE: Contiguous axial images were obtained from the base of the skull through the vertex without intravenous contrast. RADIATION DOSE REDUCTION: This exam was performed according to the departmental dose-optimization program which includes automated exposure control, adjustment of the mA and/or kV according to patient size and/or use of iterative reconstruction technique. COMPARISON:  Prior study from 12/19/2020. FINDINGS: Brain: Moderately advanced age-related cerebral atrophy with chronic small vessel ischemic disease. No acute intracranial hemorrhage. No acute large vessel territory infarct. No mass lesion or midline shift. Ventricular prominence related global parenchymal volume loss without hydrocephalus. No extra-axial fluid collection. Vascular: No abnormal hyperdense vessel. Scattered vascular calcifications noted within the carotid siphons. Skull: Scalp soft tissues demonstrate no acute finding.  Calvarium intact. Sinuses/Orbits: Globes and orbital soft tissues within normal limits. Visualized paranasal sinuses are largely clear. Trace left mastoid effusion, of doubtful significance. Other: None. ASPECTS [facility] Stroke Program Early CT Score) - Ganglionic level infarction (caudate, lentiform nuclei, internal capsule, insula, M1-M3 cortex): 7 - Supraganglionic infarction (M4-M6 cortex): 3 Total score (0-10 with 10 being normal): 10 IMPRESSION: 1. No acute intracranial abnormality. 2. ASPECTS is 10. 3. Moderately advanced age-related cerebral atrophy with chronic small vessel ischemic disease. These results were communicated to Dr. Wilford Corner at 10:53 pm on 05/31/2023 by text page via the [facility] messaging system. Electronically Signed   By: Rise Mu M.D.   On: 05/31/2023 22:54    Microbiology: No results found for this or any previous visit (from the past 240 hour(s)).   Labs: CBC: Recent Labs  Lab 05/31/23 2252 05/31/23 2308  WBC  --  11.6*  NEUTROABS  --  9.1*  HGB 11.2* 12.6  HCT 33.0* 41.3  MCV  --  101.7*  PLT  --  379   Basic Metabolic Panel: Recent Labs  Lab 05/31/23 2252 05/31/23 2308  NA 168* 165*  K 3.0* 2.9*  CL 126* 120*  CO2  --  32  GLUCOSE 130* 139*  BUN 128* 134*  CREATININE 3.10* 3.18*  CALCIUM  --  9.4   Liver Function Tests: Recent Labs  Lab 05/31/23 2308  AST 27  ALT 25  ALKPHOS 77  BILITOT 1.0  PROT 7.7  ALBUMIN 3.8   No results for input(s): "LIPASE", "AMYLASE" in the last 168 hours. No results for input(s): "AMMONIA" in the last 168 hours. Cardiac Enzymes: No results for input(s): "CKTOTAL", "CKMB", "CKMBINDEX", "TROPONINI" in the last 168 hours. BNP (last 3 results) No results for input(s): "BNP" in the last 8760 hours. CBG: Recent Labs  Lab 05/31/23 2236  GLUCAP 131*    Time spent: 35 minutes  Signed:  Gillis Santa  [facility] 06/01/2023 1:25 PM

## 2023-06-01 NOTE — Assessment & Plan Note (Addendum)
Na of 165 -on continuous D5W -no further lab follow up on comfort care

## 2023-06-01 NOTE — Plan of Care (Signed)
  Problem: Education: Goal: Knowledge of the prescribed therapeutic regimen will improve Outcome: Progressing   Problem: Coping: Goal: Ability to identify and develop effective coping behavior will improve Outcome: Progressing   Problem: Clinical Measurements: Goal: Quality of life will improve Outcome: Progressing   Problem: Respiratory: Goal: Verbalizations of increased ease of respirations will increase Outcome: Progressing   Problem: Role Relationship: Goal: Family's ability to cope with current situation will improve Outcome: Progressing Goal: Ability to verbalize concerns, feelings, and thoughts to partner or family member will improve Outcome: Progressing   Problem: Pain Management: Goal: Satisfaction with pain management regimen will improve Outcome: Progressing   Problem: Education: Goal: Knowledge of General Education information will improve Description: Including pain rating scale, medication(s)/side effects and non-pharmacologic comfort measures Outcome: Progressing   Problem: Health Behavior/Discharge Planning: Goal: Ability to manage health-related needs will improve Outcome: Progressing   Problem: Clinical Measurements: Goal: Ability to maintain clinical measurements within normal limits will improve Outcome: Progressing Goal: Will remain free from infection Outcome: Progressing Goal: Diagnostic test results will improve Outcome: Progressing Goal: Respiratory complications will improve Outcome: Progressing Goal: Cardiovascular complication will be avoided Outcome: Progressing   Problem: Activity: Goal: Risk for activity intolerance will decrease Outcome: Progressing   Problem: Nutrition: Goal: Adequate nutrition will be maintained Outcome: Progressing   Problem: Coping: Goal: Level of anxiety will decrease Outcome: Progressing   Problem: Elimination: Goal: Will not experience complications related to bowel motility Outcome:  Progressing Goal: Will not experience complications related to urinary retention Outcome: Progressing   Problem: Pain Management: Goal: General experience of comfort will improve Outcome: Progressing   Problem: Safety: Goal: Ability to remain free from injury will improve Outcome: Progressing   Problem: Skin Integrity: Goal: Risk for impaired skin integrity will decrease Outcome: Progressing

## 2023-06-01 NOTE — Progress Notes (Signed)
Report called to [facility] to [place], Charity fundraiser. Patient's family made aware of transition. Awaiting transport.

## 2023-06-01 NOTE — Assessment & Plan Note (Addendum)
-   Patient at baseline with severe dementia with Parkinson's disease.  At baseline she is nonverbal and wheelchair-bound.  She presented today with unresponsiveness after no oral intake for the past week.  She was found to have severe dehydration with sodium of 165 and acute renal failure. -Daughter would like comfort care measures for her mother but would like for her to remain on IV fluid overnight until she discuss further with her siblings. Daughter understand that pt will not have any meaningful recovery from this hospitalization.  -PRN orders with antipyretic, narcotics, agitation ordered.  -Will discontinue any lab draws.  No further workup. -Palliative and TOCM consulted to assist with placement. Pt under hospice care at SNF but daughter wants her at a different facility.

## 2023-06-01 NOTE — Assessment & Plan Note (Addendum)
-  secondary to severe dehydration -on continuous IV fluid

## 2023-06-01 NOTE — H&P (Signed)
History and Physical    Patient: Elizabeth Mccullough DOB: Aug 09, 1940 DOA: 05/31/2023 DOS: the patient was seen and examined on 06/01/2023 PCP: Donato Schultz, DO  Patient coming from: SNF  Chief Complaint:  Chief Complaint  Patient presents with   Code Stroke   HPI: Elizabeth Mccullough is a 82 y.o. female with medical history significant of chronic combined systolic and diastolic heart failure, carotid artery disease, AAA, hypertension, Parkinson's disease, advanced dementia on hospice care who was brought in for strokelike symptoms.  History obtained from ED documentation as patient is unresponsive.  Patient reportedly had sudden onset of unresponsiveness at 4:30 PM with last known well sometime around noon.  Patient was brought in as a code stroke.  She was evaluated by neurologist Dr. Wilford Corner and pt was unresponsive with minimal withdrawal to noxious stimuli.  Appears dehydrated. CT head done as part of code stroke revealed no acute process.  Lab work was notable for severe dehydration with severe hyponatremia of 165, hypokalemia of 2.9, acute renal failure with creatinine of 3.18.  Daughter at bedside reports that pt cared for her husband with Parkinson's disease who passed 5 years ago. She then shortly developed dementia with frequent falls and had gradual decline. In the past 6 months she has became non-verbal and only gets around in wheelchair. This week has stopped eating or drinking.   Daughter just wish for her to be comfortable and feels her mother has no quality of life and that she would not want her to "live like this." She understand that no treatment at this point will bring her mother back to being communicative and well. She would like pt to remain on IV fluids and vital checks overnight and discuss next steps with her siblings. She does not want any further lab draws or more workup.   Pt current at SNF under hospice care but wants her to be at an  actual hospice facility.    Review of Systems: unable to review all systems due to the inability of the patient to answer questions. Past Medical History:  Diagnosis Date   AAA (abdominal aortic aneurysm) (HCC)    Carotid artery disease (HCC) 01/12/2017   Carotid US 6/18: Bilat < 50%; repeat in 12/2000   Chronic combined systolic and diastolic CHF (congestive heart failure) (HCC) 03/01/2017   NICM // LHC 8/18: normal coronary arteries /  Echo 8/18: EF 20-25, mod AI, mod MR, PASP 32 // Echo 1/19: EF 55-60, no RWMA, Gr 1 DD, mod AI, mild MR, PASP 32   Hyperlipidemia    Parkinson's disease (HCC)    Past Surgical History:  Procedure Laterality Date   BREAST CYST ASPIRATION  1965   Right Breast   FEMUR IM NAIL Left 09/30/2021   Procedure: INTRAMEDULLARY (IM) NAIL FEMORAL;  Surgeon: Samson Frederic, MD;  Location: [facility];  Service: Orthopedics;  Laterality: Left;   HAMMER TOE SURGERY  04/10/02   Left Toe   KNEE SURGERY Right 10/17/15   meniscus repair   NASAL SEPTUM SURGERY  1980   RIGHT/LEFT HEART CATH AND CORONARY ANGIOGRAPHY N/A 03/03/2017   Procedure: RIGHT/LEFT HEART CATH AND CORONARY ANGIOGRAPHY;  Surgeon: Yvonne Kendall, MD;  Location: [facility] INVASIVE CV LAB;  Service: Cardiovascular;  Laterality: N/A;   THORACIC AORTOGRAM N/A 03/03/2017   Procedure: Thoracic Aortogram;  Surgeon: Yvonne Kendall, MD;  Location: [facility] INVASIVE CV LAB;  Service: Cardiovascular;  Laterality: N/A;   Social History:  reports that she has never  smoked. She has never used smokeless tobacco. She reports current alcohol use. She reports that she does not use drugs.  Allergies  Allergen Reactions   Iohexol Rash     Code: RASH, Desc: PATIENT STATES SHE IS ALLERGIC TO IV DYE. 20 YRS AGO SHE HAD A REACTION AT [facility], PT WAS GIVEN BENADRYL. 07/13/06/RM, Onset Date: [id number]    Penicillins Hives    Family History  Problem Relation Age of Onset   Heart disease Mother    Hyperlipidemia Sister    Hyperlipidemia  Brother    Diabetes Sister    Hyperlipidemia Sister    Cancer Sister 54       colon   Cancer Sister        breast   Stroke Brother    Hypertension Brother    Breast cancer Other    Colon cancer Other    Prostate cancer Son    Diabetes Daughter    Stomach cancer Daughter    Healthy Daughter     Prior to Admission medications   Medication Sig Start Date End Date Taking? Authorizing Provider  acetaminophen (TYLENOL) 325 MG tablet Take 2 tablets (650 mg total) by mouth every 6 (six) hours as needed for mild pain or headache. 05/28/20   Angiulli, Mcarthur Rossetti, PA-C  ALPRAZolam Prudy Feeler) 0.25 MG tablet Take 1 tablet (0.25 mg total) by mouth 3 (three) times daily as needed for anxiety or sleep. 01/15/22   Seabron Spates R, DO  Carbidopa-Levodopa ER (SINEMET CR) 25-100 MG tablet controlled release TAKE 2 TABLETS BY MOUTH 3 TIMES DAILY. Patient taking differently: Take 2 tablets by mouth in the morning, at noon, and at bedtime. 07/06/21   Donato Schultz, DO  cephALEXin (KEFLEX) 500 MG capsule Take 1 capsule (500 mg total) by mouth 2 (two) times daily. 01/28/22   Donato Schultz, DO  docusate sodium (COLACE) 100 MG capsule Take 1 capsule (100 mg total) by mouth 2 (two) times daily. 10/06/21   Hongalgi, Maximino Greenland, MD  enoxaparin (LOVENOX) 40 MG/0.4ML injection Inject 0.4 mLs (40 mg total) into the skin daily for 25 days. Discontinue after 11/01/2021 dose which will complete 30 days postop DVT prophylaxis. 10/07/21 11/01/21  Hongalgi, Maximino Greenland, MD  furosemide (LASIX) 40 MG tablet TAKE 1 TABLET BY MOUTH DAILY. 01/07/22   Donato Schultz, DO  HYDROcodone-acetaminophen (NORCO/VICODIN) 5-325 MG tablet Take 1 tablet by mouth every 4 (four) hours as needed for severe pain. 10/01/21   Swinteck, Arlys John, MD  lisinopril (ZESTRIL) 10 MG tablet TAKE 1 TABLET BY MOUTH DAILY. Patient taking differently: Take 10 mg by mouth daily. 05/11/21   Zola Button, Myrene Buddy R, DO  lovastatin (MEVACOR) 20 MG tablet TAKE  1 TABLET (20 MG TOTAL) BY MOUTH AT BEDTIME. 11/12/21   Zola Button, Grayling Congress, DO  polyethylene glycol (MIRALAX / GLYCOLAX) 17 g packet Take 17 g by mouth daily as needed for mild constipation or moderate constipation. 10/06/21   Hongalgi, Maximino Greenland, MD  potassium chloride (KLOR-CON) 10 MEQ tablet TAKE 1 TABLET BY MOUTH DAILY 01/07/22   Zola Button, Grayling Congress, DO  senna (SENOKOT) 8.6 MG TABS tablet Take 1 tablet (8.6 mg total) by mouth 2 (two) times daily. 10/06/21   Hongalgi, Maximino Greenland, MD  sertraline (ZOLOFT) 100 MG tablet TAKE 1 TABLET (100 MG TOTAL) BY MOUTH DAILY. 01/07/22   Donato Schultz, DO    Physical Exam: Vitals:   05/31/23 2300 06/01/23 0000 06/01/23  0015  BP: (!) 122/90 133/68 130/78  Pulse:  81 86  Resp: 19 20 19   Temp: 98.4 F (36.9 C)    SpO2:  100% 99%   Limited exam with pt under comfort care Constitutional: NAD, calm, comfortable, elderly female lying in bed Eyes: eyes closed ENMT: Mucous membranes are dry Neck: norma Respiratory: clear to auscultation bilaterally, no wheezing, no crackles. Normal respiratory effort. No accessory muscle use.  Cardiovascular: Regular rate and rhythm, no murmurs / rubs / gallops. No extremity edema.  Abdomen: soft, no grimace with palpation Musculoskeletal: no clubbing / cyanosis. No joint deformity upper and lower extremities.  Skin: full skin exam deferred Neurologic: pt asleep, not responsive to voice. Did not access response to noxious stimuli as pt is on comfort care  Psychiatric: No agitation Data Reviewed:  See HPI  Assessment and Plan: * Acute metabolic encephalopathy - Patient at baseline with severe dementia with Parkinson's disease.  At baseline she is nonverbal and wheelchair-bound.  She presented today with unresponsiveness after no oral intake for the past week.  She was found to have severe dehydration with sodium of 165 and acute renal failure. -Daughter would like comfort care measures for her mother but would like  for her to remain on IV fluid overnight until she discuss further with her siblings. Daughter understand that pt will not have any meaningful recovery from this hospitalization.  -PRN orders with antipyretic, narcotics, agitation ordered.  -Will discontinue any lab draws.  No further workup. -Palliative and TOCM consulted to assist with placement. Pt under hospice care at SNF but daughter wants her at a different facility.    Hypernatremia Na of 165 -on continuous D5W -no further lab follow up on comfort care  Hypokalemia -on IV fluids.  -no repeat labs with comfort care  Acute renal failure (ARF) (HCC) -secondary to severe dehydration -on continuous IV fluid      Advance Care Planning:   Code Status: Do not attempt resuscitation (DNR) - Comfort care   Consults: needs hospice consult  Family Communication: daughter at bedside  Severity of Illness: The appropriate patient status for this patient is OBSERVATION. Observation status is judged to be reasonable and necessary in order to provide the required intensity of service to ensure the patient's safety. The patient's presenting symptoms, physical exam findings, and initial radiographic and laboratory data in the context of their medical condition is felt to place them at decreased risk for further clinical deterioration. Furthermore, it is anticipated that the patient will be medically stable for discharge from the hospital within 2 midnights of admission.   Author: Anselm Jungling, DO 06/01/2023 1:09 AM  For on call review [email or web address].

## 2023-06-01 NOTE — Plan of Care (Signed)
  Problem: Role Relationship: Goal: Family's ability to cope with current situation will improve Outcome: Progressing   Problem: Pain Management: Goal: Satisfaction with pain management regimen will improve Outcome: Progressing   Problem: Clinical Measurements: Goal: Will remain free from infection Outcome: Progressing   Problem: Nutrition: Goal: Adequate nutrition will be maintained Outcome: Progressing   Problem: Pain Management: Goal: General experience of comfort will improve Outcome: Progressing   Problem: Safety: Goal: Ability to remain free from injury will improve Outcome: Progressing

## 2023-06-01 NOTE — ED Triage Notes (Signed)
Pt BIB EMS from [place] at [facility], pt on hospice care. Code stroke activated, LKW 1200, symptoms noted at 1630.   Hx dementia.  EMS VS 106/60, HR 102, 95% RA, cbg 157

## 2023-06-01 NOTE — Progress Notes (Signed)
This chaplain responded to the consult for EOL spiritual care. The Pt. is resting comfortably at the time of the visit. The Pt. three children are visiting at the bedside.   The chaplain understands the Pt. is Catholic and "Anointing of the Sick" is very important to the Pt. and family. The family is interested in having a priest visit at the residential hospice facility. At this time, the family will reach out to the priest to arrange a visit.   The family is waiting on PMT provider visit for goals of care. The chaplain invitation for intercessory prayer was accepted by the family, alongside hearing the family's request for the Pt. peaceful passing.  Chaplain Stephanie Acre [phone]

## 2023-06-19 DEATH — deceased
